# Patient Record
Sex: Female | Born: 1960
Health system: Southern US, Community
[De-identification: ages and names within clinical notes are randomized; demographics above are authoritative.]

## PROBLEM LIST (undated history)

## (undated) DIAGNOSIS — Z9889 Other specified postprocedural states: Secondary | ICD-10-CM

## (undated) DIAGNOSIS — M8580 Other specified disorders of bone density and structure, unspecified site: Secondary | ICD-10-CM

## (undated) DIAGNOSIS — Z86010 Personal history of colonic polyps: Secondary | ICD-10-CM

## (undated) DIAGNOSIS — F329 Major depressive disorder, single episode, unspecified: Secondary | ICD-10-CM

## (undated) DIAGNOSIS — F32A Depression, unspecified: Secondary | ICD-10-CM

## (undated) DIAGNOSIS — R112 Nausea with vomiting, unspecified: Secondary | ICD-10-CM

## (undated) DIAGNOSIS — B382 Pulmonary coccidioidomycosis, unspecified: Secondary | ICD-10-CM

## (undated) DIAGNOSIS — J449 Chronic obstructive pulmonary disease, unspecified: Secondary | ICD-10-CM

## (undated) DIAGNOSIS — N890 Mild vaginal dysplasia: Secondary | ICD-10-CM

## (undated) DIAGNOSIS — F410 Panic disorder [episodic paroxysmal anxiety] without agoraphobia: Secondary | ICD-10-CM

## (undated) HISTORY — DX: Chronic obstructive pulmonary disease, unspecified: J44.9

## (undated) HISTORY — PX: OVARIAN CYST REMOVAL: SHX89

## (undated) HISTORY — DX: Mild vaginal dysplasia: N89.0

## (undated) HISTORY — PX: BREAST EXCISIONAL BIOPSY: SUR124

## (undated) HISTORY — DX: Personal history of colonic polyps: Z86.010

## (undated) HISTORY — PX: CATARACT EXTRACTION: SUR2

## (undated) HISTORY — PX: HEMORRHOID SURGERY: SHX153

## (undated) HISTORY — DX: Pulmonary coccidioidomycosis, unspecified: B38.2

## (undated) HISTORY — DX: Other specified disorders of bone density and structure, unspecified site: M85.80

## (undated) HISTORY — PX: ABDOMINAL HYSTERECTOMY: SHX81

## (undated) HISTORY — PX: BILATERAL VATS ABLATION: SHX1224

## (undated) SURGERY — Surgical Case
Anesthesia: *Unknown

---

## 1999-04-05 HISTORY — PX: COLONOSCOPY: SHX174

## 1999-04-05 HISTORY — PX: LUNG SURGERY: SHX703

## 2003-04-05 HISTORY — PX: VESICOVAGINAL FISTULA CLOSURE W/ TAH: SUR271

## 2010-07-13 ENCOUNTER — Ambulatory Visit: Payer: Self-pay | Admitting: Gynecology

## 2010-07-15 ENCOUNTER — Ambulatory Visit (INDEPENDENT_AMBULATORY_CARE_PROVIDER_SITE_OTHER): Payer: PRIVATE HEALTH INSURANCE | Admitting: Gynecology

## 2010-07-15 ENCOUNTER — Other Ambulatory Visit: Payer: Self-pay | Admitting: Gynecology

## 2010-07-15 ENCOUNTER — Other Ambulatory Visit (HOSPITAL_COMMUNITY)
Admission: RE | Admit: 2010-07-15 | Discharge: 2010-07-15 | Disposition: A | Discharge status: Home / Self Care | Payer: PRIVATE HEALTH INSURANCE | Source: Ambulatory Visit | Attending: Gynecology | Admitting: Gynecology

## 2010-07-15 DIAGNOSIS — N951 Menopausal and female climacteric states: Secondary | ICD-10-CM

## 2010-07-15 DIAGNOSIS — R3589 Other polyuria: Secondary | ICD-10-CM

## 2010-07-15 DIAGNOSIS — Z124 Encounter for screening for malignant neoplasm of cervix: Secondary | ICD-10-CM | POA: Insufficient documentation

## 2010-07-15 DIAGNOSIS — Z01419 Encounter for gynecological examination (general) (routine) without abnormal findings: Secondary | ICD-10-CM

## 2010-07-15 DIAGNOSIS — F329 Major depressive disorder, single episode, unspecified: Secondary | ICD-10-CM

## 2010-07-15 DIAGNOSIS — N952 Postmenopausal atrophic vaginitis: Secondary | ICD-10-CM

## 2010-07-15 DIAGNOSIS — F3289 Other specified depressive episodes: Secondary | ICD-10-CM

## 2010-07-15 DIAGNOSIS — R358 Other polyuria: Secondary | ICD-10-CM

## 2010-07-22 ENCOUNTER — Other Ambulatory Visit: Payer: Self-pay | Admitting: Gynecology

## 2010-07-22 DIAGNOSIS — Z1231 Encounter for screening mammogram for malignant neoplasm of breast: Secondary | ICD-10-CM

## 2010-07-28 ENCOUNTER — Ambulatory Visit: Payer: PRIVATE HEALTH INSURANCE

## 2010-07-29 ENCOUNTER — Ambulatory Visit
Admission: RE | Admit: 2010-07-29 | Discharge: 2010-07-29 | Disposition: A | Discharge status: Home / Self Care | Payer: PRIVATE HEALTH INSURANCE | Source: Ambulatory Visit | Attending: Gynecology | Admitting: Gynecology

## 2010-07-29 DIAGNOSIS — Z1231 Encounter for screening mammogram for malignant neoplasm of breast: Secondary | ICD-10-CM

## 2010-07-30 ENCOUNTER — Ambulatory Visit (INDEPENDENT_AMBULATORY_CARE_PROVIDER_SITE_OTHER): Payer: PRIVATE HEALTH INSURANCE | Admitting: Women's Health

## 2010-07-30 DIAGNOSIS — L738 Other specified follicular disorders: Secondary | ICD-10-CM

## 2010-07-30 DIAGNOSIS — L678 Other hair color and hair shaft abnormalities: Secondary | ICD-10-CM

## 2010-08-02 ENCOUNTER — Other Ambulatory Visit: Payer: Self-pay | Admitting: Gynecology

## 2010-08-02 DIAGNOSIS — R928 Other abnormal and inconclusive findings on diagnostic imaging of breast: Secondary | ICD-10-CM

## 2010-08-05 ENCOUNTER — Ambulatory Visit
Admission: RE | Admit: 2010-08-05 | Discharge: 2010-08-05 | Disposition: A | Discharge status: Home / Self Care | Payer: PRIVATE HEALTH INSURANCE | Source: Ambulatory Visit | Attending: Gynecology | Admitting: Gynecology

## 2010-08-05 DIAGNOSIS — R928 Other abnormal and inconclusive findings on diagnostic imaging of breast: Secondary | ICD-10-CM

## 2010-08-19 ENCOUNTER — Other Ambulatory Visit (INDEPENDENT_AMBULATORY_CARE_PROVIDER_SITE_OTHER): Payer: PRIVATE HEALTH INSURANCE

## 2010-08-19 DIAGNOSIS — E7800 Pure hypercholesterolemia, unspecified: Secondary | ICD-10-CM

## 2010-08-19 DIAGNOSIS — R631 Polydipsia: Secondary | ICD-10-CM

## 2010-10-13 ENCOUNTER — Ambulatory Visit (HOSPITAL_COMMUNITY)
Admission: RE | Admit: 2010-10-13 | Discharge: 2010-10-13 | Disposition: A | Discharge status: Home / Self Care | Payer: PRIVATE HEALTH INSURANCE | Attending: Psychiatry | Admitting: Psychiatry

## 2010-10-13 ENCOUNTER — Emergency Department (HOSPITAL_COMMUNITY)
Admission: EM | Admit: 2010-10-13 | Discharge: 2010-10-14 | Disposition: A | Discharge status: Other Acute Inpatient Hospital | Payer: PRIVATE HEALTH INSURANCE | Attending: Emergency Medicine | Admitting: Emergency Medicine

## 2010-10-13 DIAGNOSIS — F3289 Other specified depressive episodes: Secondary | ICD-10-CM | POA: Insufficient documentation

## 2010-10-13 DIAGNOSIS — F329 Major depressive disorder, single episode, unspecified: Secondary | ICD-10-CM | POA: Insufficient documentation

## 2010-10-13 DIAGNOSIS — F332 Major depressive disorder, recurrent severe without psychotic features: Secondary | ICD-10-CM | POA: Insufficient documentation

## 2010-10-13 LAB — URINALYSIS, ROUTINE W REFLEX MICROSCOPIC
Bilirubin Urine: NEGATIVE
Glucose, UA: NEGATIVE mg/dL
Ketones, ur: NEGATIVE mg/dL
Leukocytes, UA: NEGATIVE
Nitrite: NEGATIVE
Protein, ur: NEGATIVE mg/dL
Specific Gravity, Urine: 1.009 (ref 1.005–1.030)
Urobilinogen, UA: 0.2 mg/dL (ref 0.0–1.0)
pH: 7 (ref 5.0–8.0)

## 2010-10-13 LAB — ETHANOL: Alcohol, Ethyl (B): <11 mg/dL (ref 0–11)

## 2010-10-13 LAB — RAPID URINE DRUG SCREEN, HOSP PERFORMED
Amphetamines: NOT DETECTED
Barbiturates: NOT DETECTED
Benzodiazepines: NOT DETECTED
Cocaine: NOT DETECTED
Opiates: NOT DETECTED
Tetrahydrocannabinol: NOT DETECTED

## 2010-10-13 LAB — DIFFERENTIAL
Basophils Absolute: 0 10*3/uL (ref 0.0–0.1)
Basophils Relative: 0 % (ref 0–1)
Eosinophils Absolute: 0.4 10*3/uL (ref 0.0–0.7)
Eosinophils Relative: 3 % (ref 0–5)
Lymphocytes Relative: 34 % (ref 12–46)
Lymphs Abs: 5.2 10*3/uL — ABNORMAL HIGH (ref 0.7–4.0)
Monocytes Absolute: 0.7 10*3/uL (ref 0.1–1.0)
Monocytes Relative: 5 % (ref 3–12)
Neutro Abs: 9 10*3/uL — ABNORMAL HIGH (ref 1.7–7.7)
Neutrophils Relative %: 58 % (ref 43–77)

## 2010-10-13 LAB — CBC
HCT: 40.5 % (ref 36.0–46.0)
Hemoglobin: 13.9 g/dL (ref 12.0–15.0)
MCH: 31.7 pg (ref 26.0–34.0)
MCHC: 34.3 g/dL (ref 30.0–36.0)
MCV: 92.3 fL (ref 78.0–100.0)
Platelets: 319 10*3/uL (ref 150–400)
RBC: 4.39 MIL/uL (ref 3.87–5.11)
RDW: 12.5 % (ref 11.5–15.5)
WBC: 15.3 10*3/uL — ABNORMAL HIGH (ref 4.0–10.5)

## 2010-10-13 LAB — URINE MICROSCOPIC-ADD ON

## 2010-10-13 LAB — BASIC METABOLIC PANEL
BUN: 11 mg/dL (ref 6–23)
CO2: 28 mEq/L (ref 19–32)
Calcium: 9.5 mg/dL (ref 8.4–10.5)
Chloride: 103 mEq/L (ref 96–112)
Creatinine, Ser: 0.71 mg/dL (ref 0.50–1.10)
GFR calc Af Amer: >60 mL/min (ref >60)
GFR calc non Af Amer: >60 mL/min (ref >60)
Glucose, Bld: 97 mg/dL (ref 70–99)
Potassium: 4 mEq/L (ref 3.5–5.1)
Sodium: 139 mEq/L (ref 135–145)

## 2010-10-13 LAB — POCT PREGNANCY, URINE: Preg Test, Ur: NEGATIVE

## 2010-10-14 ENCOUNTER — Inpatient Hospital Stay (HOSPITAL_COMMUNITY)
Admission: AD | Admit: 2010-10-14 | Discharge: 2010-10-20 | DRG: 885 | Disposition: A | Discharge status: Home / Self Care | Payer: PRIVATE HEALTH INSURANCE | Source: Ambulatory Visit | Attending: Psychiatry | Admitting: Psychiatry

## 2010-10-14 DIAGNOSIS — R911 Solitary pulmonary nodule: Secondary | ICD-10-CM

## 2010-10-14 DIAGNOSIS — H16219 Exposure keratoconjunctivitis, unspecified eye: Secondary | ICD-10-CM

## 2010-10-14 DIAGNOSIS — F332 Major depressive disorder, recurrent severe without psychotic features: Principal | ICD-10-CM

## 2010-10-14 DIAGNOSIS — R9389 Abnormal findings on diagnostic imaging of other specified body structures: Secondary | ICD-10-CM

## 2010-10-14 DIAGNOSIS — Z609 Problem related to social environment, unspecified: Secondary | ICD-10-CM

## 2010-10-14 DIAGNOSIS — Q6101 Congenital single renal cyst: Secondary | ICD-10-CM

## 2010-10-14 DIAGNOSIS — R45851 Suicidal ideations: Secondary | ICD-10-CM

## 2010-10-14 DIAGNOSIS — R109 Unspecified abdominal pain: Secondary | ICD-10-CM

## 2010-10-17 ENCOUNTER — Ambulatory Visit (HOSPITAL_COMMUNITY)
Admission: EM | Admit: 2010-10-17 | Discharge: 2010-10-17 | Disposition: A | Discharge status: Home / Self Care | Payer: PRIVATE HEALTH INSURANCE | Source: Ambulatory Visit | Attending: Psychiatry | Admitting: Psychiatry

## 2010-10-17 ENCOUNTER — Ambulatory Visit (HOSPITAL_COMMUNITY): Payer: PRIVATE HEALTH INSURANCE

## 2010-10-17 DIAGNOSIS — F17200 Nicotine dependence, unspecified, uncomplicated: Secondary | ICD-10-CM | POA: Insufficient documentation

## 2010-10-17 DIAGNOSIS — R109 Unspecified abdominal pain: Secondary | ICD-10-CM | POA: Insufficient documentation

## 2010-10-17 DIAGNOSIS — Z90710 Acquired absence of both cervix and uterus: Secondary | ICD-10-CM | POA: Insufficient documentation

## 2010-10-17 DIAGNOSIS — Z79899 Other long term (current) drug therapy: Secondary | ICD-10-CM | POA: Insufficient documentation

## 2010-10-17 DIAGNOSIS — R110 Nausea: Secondary | ICD-10-CM | POA: Insufficient documentation

## 2010-10-17 LAB — URINALYSIS, ROUTINE W REFLEX MICROSCOPIC
Bilirubin Urine: NEGATIVE
Glucose, UA: NEGATIVE mg/dL
Ketones, ur: NEGATIVE mg/dL
Leukocytes, UA: NEGATIVE
Nitrite: NEGATIVE
Protein, ur: NEGATIVE mg/dL
Specific Gravity, Urine: 1.02 (ref 1.005–1.030)
Urobilinogen, UA: 0.2 mg/dL (ref 0.0–1.0)
pH: 5.5 (ref 5.0–8.0)

## 2010-10-17 LAB — URINE MICROSCOPIC-ADD ON

## 2010-10-18 ENCOUNTER — Ambulatory Visit (HOSPITAL_COMMUNITY)
Admit: 2010-10-18 | Discharge: 2010-10-18 | Disposition: A | Discharge status: Home / Self Care | Payer: PRIVATE HEALTH INSURANCE | Attending: Psychiatry | Admitting: Psychiatry

## 2010-10-18 DIAGNOSIS — R319 Hematuria, unspecified: Secondary | ICD-10-CM | POA: Insufficient documentation

## 2010-10-18 DIAGNOSIS — Q619 Cystic kidney disease, unspecified: Secondary | ICD-10-CM | POA: Insufficient documentation

## 2010-10-18 DIAGNOSIS — R109 Unspecified abdominal pain: Secondary | ICD-10-CM | POA: Insufficient documentation

## 2010-10-18 MED ORDER — GADOBENATE DIMEGLUMINE 529 MG/ML IV SOLN
15.0000 mL | Freq: Once | INTRAVENOUS | Status: AC | PRN
  Administered 2010-10-18: 15 mL via INTRAVENOUS

## 2010-10-19 LAB — URINE CULTURE
Colony Count: NO GROWTH
Culture  Setup Time: 201207170131
Culture: NO GROWTH
Special Requests: NEGATIVE

## 2010-10-19 LAB — GC/CHLAMYDIA PROBE AMP, URINE
Chlamydia, Swab/Urine, PCR: NEGATIVE
GC Probe Amp, Urine: NEGATIVE

## 2010-11-05 NOTE — Discharge Summary (Signed)
NAMECHESTER, ROMERO NO.:  [id number]  MEDICAL RECORD NO.:  [id number]  LOCATION:  1610                          FACILITY:  [facility]  PHYSICIAN:  Orson Aloe, MD       DATE OF BIRTH:  Sep 02, 1960  DATE OF ADMISSION:  10/14/2010 DATE OF DISCHARGE:  10/20/2010                              DISCHARGE SUMMARY   IDENTIFYING INFORMATION:  This is a 50 year old divorced female.  This is a voluntary admission.  HISTORY OF PRESENT ILLNESS:  This is the first [facility] admission for Crystal Duarte, a native of [place], who had just come to [place] a few months ago. Moved here to be near her children and grandchildren and has a history of major depression that has been stabilized on Cymbalta for about 4 years.  Eight weeks ago she began to feel that her medication was no longer working and could feel herself sliding downward into depression. She reported significant and gradual onset of anhedonia with a lot of difficulty getting her daily activities done, had gotten to the point where she stopped bathing, stopped wearing makeup, was not even walking the dog regularly.  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  She had not developed a specific suicidal plan, but has a history of 3 significant prior suicide attempts and was recognizing that she was feeling unstable as her depression got worse.  She has no history of substance abuse.  Her psychiatric provider had increased her Cymbalta 6 weeks ago to 90 mg, but it gave her strange thoughts, and she was feeling that she was having an out-of-body experience.  She presented requesting help with her severe depression.  PAST PSYCHIATRIC HISTORY:  Significant for a history of prior suicide attempt, first at age 46 by overdosing on analgesics; also a history of overdosing on alprazolam and most recently in 2008 an attempt at  carbon monoxide poisoning.  She has received a series of 6 ECT treatments in the past in [place] after the carbon monoxide poisoning attempt.  Most recently, Abilify 2.5 mg daily was added to her medication regimen.  MEDICAL EVALUATION AND DIAGNOSTIC STUDIES:  She was medically evaluated in our emergency room.  This is a healthy female who presented in no acute distress with a normal basic metabolic panel.  Urine drug screen negative.  Alcohol screen negative.  CBC normal.  BUN 11, creatinine 0.71.  She is a well-nourished, well-developed female who appears to be her stated age with no abnormal movements or focal neurologic symptoms.  CHRONIC MEDICAL PROBLEMS:  Chronic dry eyes and she is postmenopausal.  PRIMARY CARE PRACTITIONER:  Dr. Lily Peer, her gynecologist.  COURSE OF HOSPITALIZATION:  She was admitted to our mood disorders program and after a discussion of risks and benefits and options, we elected to discontinue her Cymbalta since she had been intolerant of an increased dose, and we elected to start her on Effexor XR 37.5 mg daily, which we had gradually titrated to 37.5 mg p.o. b.i.d. and finally to 75 mg in the morning and 37.5 mg in the afternoon.  We  also continued her Abilify, but increased it to 2.5 mg b.i.d.  She tolerated the medications well and without side effects and felt that they made a significant difference in her mood, gradually became brighter in affect, more spontaneous, speech more fluent, and felt the depression lifting. She consistently denied suicidal thoughts.  She developed right flank pain, was evaluated in our emergency room, where she was found to have trace blood in her urine, but no evidence of infection.  CT scan of her abdomen revealed a nodule at the base of her lung and an unclear mass on the upper pole of her kidney.  MRI of her abdomen revealed a 9-mm benign hemorrhagic cyst in the upper pole of the right kidney which corresponded with  the lesion seen on the recent abdomen CT.  It is noted that there was no evidence of renal neoplasm or other significant abnormality.  Because of her concerns about a history of acute kidney infections, we did set up a follow-up appointment for her with [facility].  She had a previous history of alcohol abuse and did not experience any withdrawal symptoms while here.  She was given methocarbamol, a brief course of Percocet for the flank pain.  She had a low-grade temperature at 99.9 and urine cultures were negative.  GC and chlamydia DNA probes were negative.  By the 16th, she was feeling good, mood stabilized, felt she had made significant improvement, was planning on taking 2 weeks off work to evaluate her work situation, take medications, and complete scheduled follow-up appointments, denying any suicidal or homicidal thoughts.  DISCHARGE DIAGNOSES:  AXIS I:  Major depression, recurrent severe. AXIS II:  No diagnosis. AXIS III:  Right flank pain, lung nodule NOS, and right kidney cyst. AXIS IV:  Severe occupational stressors. AXIS V:  Current 44, past year 39.  PLAN:  To have her follow up with Cora Collum, NP, on July 19 at 1:15 p.m.  She is also schedule with [facility] on August 8 at 1:30 p.m. and Dr. Marcelyn Bruins at [facility] for her pulmonary nodule on August 10 at 3:30.     Crystal Duarte, N.P.   ______________________________ Orson Aloe, MD    MAS/MEDQ  D:  10/20/2010  T:  10/21/2010  Job:  [id number]  Electronically Signed by Kari Baars N.P. on 10/27/2010 04:54:09 AM Electronically Signed by Orson Aloe  on 11/05/2010 10:24:42 AM

## 2010-11-05 NOTE — Assessment & Plan Note (Signed)
NAMEBETTY, Duarte NO.:  [id number]  MEDICAL RECORD NO.:  [id number]  LOCATION:  1610                          FACILITY:  [facility]  PHYSICIAN:  Orson Aloe, MD       DATE OF BIRTH:  [birth year]  DATE OF ADMISSION:  10/14/2010 DATE OF DISCHARGE:                      PSYCHIATRIC ADMISSION ASSESSMENT   DATE OF ASSESSMENT:  October 14, 2010, at 13:55.  IDENTIFYING INFORMATION:  A 50 year old female, divorced.  This is a voluntary admission.  HISTORY OF THE PRESENT ILLNESS:  This is the first [facility] admission for Crystal Duarte, a native of [place] who has just been in [place] a few months.  She moved here to be near her children and grandchildren and has a history of major depression.  She has been stable on Cymbalta for about 4 years.  Six to eight weeks ago, she began to feel that her medication was no longer working and could feel herself sliding downward into depression.  She reports that she can feel that she is having a personality change and has a lot of difficulty with getting interested in any daily activities.  This has been getting progressively worse very gradually for the last 6 weeks, and she reports that for the past couple weeks, she has been simply lying on the couch unable to get up and on Sundays is not even walking the dog.  She stopped wearing makeup. Hygiene has deteriorated, and she feels completely anhedonic.  Her concentration has gotten progressively worse and as it worsens, this is accompanied by some increased irritability.  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 She has also developed some hypersensitivity to noise and has had increasing episodes of panic.  Her sleep is 7 hours a night on a good night but is not completely restful and is broken on most nights.  She feels that she is gaining weight in spite of her decreased appetite but cannot quantify this.  She approached her psychiatric nurse  practitioner who increased her Cymbalta to 90 mg, but it gave her strange thoughts, feeling that she was having an out of body experience.  She gradually became more frightened of her mood since she has a history of 3 significant prior suicide attempts, and she was feeling very unstable and asks for help. She denies a history of substance abuse.  PAST PSYCHIATRIC HISTORY:  Currently followed as an outpatient by Cora Collum, NP, in Dr. Rolly Pancake McKinney's office.  She has a history of major depression with onset at age 32 with a suicide attempt by overdose of analgesics.  She also has history of overdosing on alprazolam and carbon monoxide poisoning.  Last suicide attempt was around 2008 due to carbon monoxide poisoning and at that time, she received ECT in [place], approximately 5-6 treatment sessions. Subsequently, she was placed on Cymbalta 60 mg daily on which she has been quite stable in the past 4 years, also taking trazodone 100 mg at h.s.  When this episode started, she was increased to 90 mg daily but felt intolerant of it, having strange feelings and thoughts.  Abilify 2.5 mg was added 2 days ago, but  she feels it is not helping.  Additional medication trials in the past include Lexapro and Prozac with positive responses.  She is very intolerant of Wellbutrin which made her feel very anxious with a sensation of her skin crawling.  No trials of Zoloft, and no previous trials of Effexor  SOCIAL HISTORY:  Originally born and raised in [place].  She has 2 grown children and 2 grandchildren who live in town.  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  She endorses social isolation since moving to [place] and misses the active social life she had in [place], [place].  No longer drinks alcohol but did in the past.  FAMILY HISTORY:  Denies a family history of mental illness or substance abuse.  MEDICAL HISTORY: 1. Primary care physician is Dr.  Lily Peer, her gynecologist. 2. Chronic medical problems are post menopausal and dry eyes. 3. Medications are gel hormone therapy with __________ and Restasis     eyedrops 2 drops b.i.d. both eyes, Abilify 2.5 mg daily, Cymbalta     60 mg daily, trazodone 100 mg p.o. q.h.s. 4. Drug allergies are SULFA.  PHYSICAL EXAM:  Done in the emergency room and is noted in the record. This is a healthy female in no acute distress with a normal basic metabolic panel.  Urine drug screen negative.  Alcohol screen negative. CBC normal with hemoglobin 13.9, platelets 319,000.  BUN 11, creatinine 0.71.  This is a well-nourished, well-developed female with a smooth motor exam, normally-developed, no abnormal movements.  MENTAL STATUS EXAM:  Fully alert female.  Good eye contact.  Mildly anxious affect, blunted affect.  Speech non-pressured.  Gives a coherent history.  Mood depressed, quite fearful that she will harm herself, feeling that her mood is unstable.  Thinking is nonpsychotic. Cognitively, she is intact and oriented x3.  Endorsing social isolation. Affect stable.  Mood stable.  Insight adequate.  Fund of knowledge good. Impulse control and judgment normal.  Axis I:  Major depressive disorder, recurrent, severe. Axis II:  No diagnosis. Axis III:  Dry eyes. Axis IV:  Social isolation is a problem.  Supportive family is an asset. Axis V:  Current 45.  Past year not known.  PLAN:  Voluntarily admit her with the goal of alleviating her passive suicidal thoughts and stabilizing her depressive spiral.  We are going to look at discontinuing her Cymbalta and have discussed several options for today.  We are going to increase her Abilify to 2.5 mg b.i.d., and we will get a TSH and a free T4 and a free T3.     Margaret A. Lorin Picket, N.P.   ______________________________ Orson Aloe, MD    MAS/MEDQ  D:  10/14/2010  T:  10/14/2010  Job:  [id number]  Electronically Signed by Kari Baars N.P. on  10/15/2010 09:06:22 AM Electronically Signed by Orson Aloe  on 11/05/2010 10:24:54 AM

## 2010-11-08 ENCOUNTER — Other Ambulatory Visit: Payer: Self-pay | Admitting: *Deleted

## 2010-11-08 MED ORDER — ESTRADIOL 0.52 MG/0.87 GM (0.06%) TD GEL: TRANSDERMAL | Status: DC

## 2010-11-12 ENCOUNTER — Encounter: Payer: Self-pay | Admitting: Pulmonary Disease

## 2010-11-12 ENCOUNTER — Ambulatory Visit (INDEPENDENT_AMBULATORY_CARE_PROVIDER_SITE_OTHER): Payer: PRIVATE HEALTH INSURANCE | Admitting: Pulmonary Disease

## 2010-11-12 VITALS — BP 100/60 | HR 76 | Temp 98.6°F | Ht 65.0 in | Wt 162.8 lb

## 2010-11-12 DIAGNOSIS — R918 Other nonspecific abnormal finding of lung field: Secondary | ICD-10-CM

## 2010-11-12 NOTE — Patient Instructions (Signed)
Will schedule for full ct scan of chest to establish your baseline.  Will call you with results, and set up surveillance schedule.   Will try and get your old scans from [facility] Stop smoking.  See flyer for number to smoking cessation classes at [facility].

## 2010-11-12 NOTE — Progress Notes (Signed)
  Subjective:    Patient ID: Crystal Duarte, female    DOB: 11-04-1960, 51 y.o.   MRN: [id number]  HPI The patient is a 50 year old female who I've been asked to see for pulmonary nodules.  She recently underwent a CT of her abdomen and pelvis for possible kidney stones, and was noted to have on the lower lung cuts small pulmonary nodules.  She has not had a complete chest CT.  The patient does have a long history of tobacco abuse, and currently is smoking one pack of cigarettes a day.  She also has lived in [place], and underwent a lung biopsy in 2001 which she tells me was positive for coccidiomycosis.  She denies any history of active tuberculosis or exposure to anyone with active TB.  She has a typical smoker's cough, but has been eating well with no recent weight loss.   Review of Systems  Constitutional: Negative for fever and unexpected weight change.  HENT: Positive for dental problem. Negative for ear pain, nosebleeds, congestion, sore throat, rhinorrhea, sneezing, trouble swallowing, postnasal drip and sinus pressure.   Eyes: Negative for redness and itching.  Respiratory: Positive for cough and shortness of breath. Negative for chest tightness and wheezing.   Cardiovascular: Negative for palpitations and leg swelling.  Gastrointestinal: Negative for nausea and vomiting.  Genitourinary: Negative for dysuria.  Musculoskeletal: Negative for joint swelling.  Skin: Negative for rash.  Neurological: Negative for headaches.  Hematological: Does not bruise/bleed easily.  Psychiatric/Behavioral: Positive for dysphoric mood. The patient is nervous/anxious.        Objective:   Physical Exam Constitutional:  Well developed, no acute distress  HENT:  Nares patent without discharge  Oropharynx without exudate, palate and uvula are normal  Eyes:  Perrla, eomi, no scleral icterus  Neck:  No JVD, no TMG  Cardiovascular:  Normal rate, regular rhythm, no rubs or gallops.  No murmurs  Intact distal pulses  Pulmonary :  Normal breath sounds, no stridor or respiratory distress   No rales, rhonchi, or wheezing  Abdominal:  Soft, nondistended, bowel sounds present.  No tenderness noted.   Musculoskeletal:  No lower extremity edema noted.  Lymph Nodes:  No cervical lymphadenopathy noted  Skin:  No cyanosis noted  Neurologic:  Alert, appropriate, moves all 4 extremities without obvious deficit.         Assessment & Plan:

## 2010-11-14 ENCOUNTER — Encounter: Payer: Self-pay | Admitting: Pulmonary Disease

## 2010-11-14 NOTE — Assessment & Plan Note (Signed)
The patient has been noted to have pulmonary nodules on her CT abdomen and pelvis, and these are more than likely due to old coccidiomycosis.  However, she does need a full CT chest for complete evaluation.  We'll try and obtain her old CT from [facility], but if not available will need surveillance to ensure we're dealing with benign disease.  The patient is agreeable to this approach.  I have also stressed to her the need to quit smoking.

## 2010-11-29 ENCOUNTER — Ambulatory Visit (INDEPENDENT_AMBULATORY_CARE_PROVIDER_SITE_OTHER)
Admission: RE | Admit: 2010-11-29 | Discharge: 2010-11-29 | Disposition: A | Discharge status: Home / Self Care | Payer: PRIVATE HEALTH INSURANCE | Source: Ambulatory Visit | Attending: Pulmonary Disease | Admitting: Pulmonary Disease

## 2010-11-29 DIAGNOSIS — R918 Other nonspecific abnormal finding of lung field: Secondary | ICD-10-CM

## 2010-11-29 MED ORDER — IOHEXOL 300 MG/ML  SOLN
100.0000 mL | Freq: Once | INTRAMUSCULAR | Status: AC | PRN
  Administered 2010-11-29: 100 mL via INTRAVENOUS

## 2010-12-02 ENCOUNTER — Telehealth: Payer: Self-pay | Admitting: Pulmonary Disease

## 2010-12-02 NOTE — Telephone Encounter (Signed)
Please let pt know that her chest ct shows multiple TINY small nodules. None of them look suspicious. Are trying to get old ct from [facility]. If available, and comparison shows nothing has changed, no further followup required. If not available, will need followup ct chest in one year. Have her call us in 2 weeks if she has not heard from [place].   Spoke with pt and she is aware of ct results. Pt verbalized understanding and is aware to call back in 2 weeks if she has no heard from [place]. Pt had no questions

## 2012-07-11 ENCOUNTER — Encounter (HOSPITAL_COMMUNITY): Payer: Self-pay | Admitting: Emergency Medicine

## 2012-07-11 ENCOUNTER — Emergency Department (HOSPITAL_COMMUNITY)
Admission: EM | Admit: 2012-07-11 | Discharge: 2012-07-11 | Disposition: A | Discharge status: Home / Self Care | Payer: BC Managed Care – PPO | Attending: Emergency Medicine | Admitting: Emergency Medicine

## 2012-07-11 DIAGNOSIS — F17200 Nicotine dependence, unspecified, uncomplicated: Secondary | ICD-10-CM | POA: Insufficient documentation

## 2012-07-11 DIAGNOSIS — M542 Cervicalgia: Secondary | ICD-10-CM | POA: Insufficient documentation

## 2012-07-11 DIAGNOSIS — T43505A Adverse effect of unspecified antipsychotics and neuroleptics, initial encounter: Secondary | ICD-10-CM | POA: Insufficient documentation

## 2012-07-11 DIAGNOSIS — F3289 Other specified depressive episodes: Secondary | ICD-10-CM | POA: Insufficient documentation

## 2012-07-11 DIAGNOSIS — T43205A Adverse effect of unspecified antidepressants, initial encounter: Secondary | ICD-10-CM | POA: Insufficient documentation

## 2012-07-11 DIAGNOSIS — Z8619 Personal history of other infectious and parasitic diseases: Secondary | ICD-10-CM | POA: Insufficient documentation

## 2012-07-11 DIAGNOSIS — T50905A Adverse effect of unspecified drugs, medicaments and biological substances, initial encounter: Secondary | ICD-10-CM

## 2012-07-11 DIAGNOSIS — R0602 Shortness of breath: Secondary | ICD-10-CM | POA: Insufficient documentation

## 2012-07-11 DIAGNOSIS — F329 Major depressive disorder, single episode, unspecified: Secondary | ICD-10-CM | POA: Insufficient documentation

## 2012-07-11 DIAGNOSIS — R1310 Dysphagia, unspecified: Secondary | ICD-10-CM | POA: Insufficient documentation

## 2012-07-11 DIAGNOSIS — M79609 Pain in unspecified limb: Secondary | ICD-10-CM | POA: Insufficient documentation

## 2012-07-11 DIAGNOSIS — Z79899 Other long term (current) drug therapy: Secondary | ICD-10-CM | POA: Insufficient documentation

## 2012-07-11 DIAGNOSIS — Z9889 Other specified postprocedural states: Secondary | ICD-10-CM | POA: Insufficient documentation

## 2012-07-11 DIAGNOSIS — R002 Palpitations: Secondary | ICD-10-CM | POA: Insufficient documentation

## 2012-07-11 HISTORY — DX: Major depressive disorder, single episode, unspecified: F32.9

## 2012-07-11 HISTORY — DX: Depression, unspecified: F32.A

## 2012-07-11 MED ORDER — ALPRAZOLAM 1 MG PO TABS: 1.0000 mg | ORAL_TABLET | Freq: Every evening | ORAL | Status: DC | PRN

## 2012-07-11 NOTE — ED Notes (Signed)
telepsych info faxed and called 

## 2012-07-11 NOTE — ED Notes (Signed)
Came to nurses station requesting to go home. Stated she didn't realize it would take so long to talk with telepsych  and that she would talk with her psychiatrist in the morning. Spoke with Dr.Allen about patient wanting to go home-he said he would talk with Dr.Beacon. D/C instructions received.

## 2012-07-11 NOTE — ED Provider Notes (Signed)
History     CSN: [id number]  Arrival date & time 07/11/12  1400   First MD Initiated Contact with Patient 07/11/12 1516      Chief Complaint  Patient presents with  . Shortness of Breath  . Allergic Reaction  . Palpitations     HPI Pt states she recently began zoloft and vistiril.  She was switched from Cymbalta and trazodone.  She states that were working more. Took it for 2nd time at 1000. Pt states she started having sob, palpitations, R arm and neck pain. . Pt states she has sob and difficulty swallowing. Pt able to speak in complete sentences and can manage saliva.  Patient has had depression recently but has had no suicidal or homicidal thoughts or ideation.   Past Medical History  Diagnosis Date  . Coccidioidomycosis, pulmonary   . Depression     Past Surgical History  Procedure Laterality Date  . Lung surgery  2001    VATS surgery   . Vesicovaginal fistula closure w/ tah  2005    Family History  Problem Relation Age of Onset  . Asthma Mother   . Ovarian cancer Mother     History  Substance Use Topics  . Smoking status: Current Every Day Smoker -- 1.00 packs/day for 30 years    Types: Cigarettes  . Smokeless tobacco: Never Used  . Alcohol Use: No    OB History   Grav Para Term Preterm Abortions TAB SAB Ect Mult Living                  Review of Systems  All other systems reviewed and are negative.    Allergies  Sulfa antibiotics  Home Medications   Current Outpatient Rx  Name  Route  Sig  Dispense  Refill  . hydrOXYzine (ATARAX/VISTARIL) 25 MG tablet   Oral   Take 25 mg by mouth 2 (two) times daily as needed for itching.         Marland Kitchen ibuprofen (ADVIL,MOTRIN) 200 MG tablet   Oral   Take 400 mg by mouth every 8 (eight) hours as needed for pain.         Marland Kitchen QUEtiapine (SEROQUEL) 100 MG tablet   Oral   Take 50-100 mg by mouth at bedtime.         . sertraline (ZOLOFT) 50 MG tablet   Oral   Take 50 mg by mouth daily.         Marland Kitchen  ALPRAZolam (XANAX) 1 MG tablet   Oral   Take 1 tablet (1 mg total) by mouth at bedtime as needed for sleep.   30 tablet   0     BP 128/79  Pulse 76  Temp(Src) 98.9 F (37.2 C) (Oral)  Resp 22  SpO2 99%  Physical Exam  Nursing note and vitals reviewed. Constitutional: She is oriented to person, place, and time. She appears well-developed and well-nourished. No distress.  HENT:  Head: Normocephalic and atraumatic.  Eyes: Pupils are equal, round, and reactive to light.  Neck: Normal range of motion.  Cardiovascular: Normal rate and intact distal pulses.   Pulmonary/Chest: No respiratory distress.  Abdominal: Normal appearance. She exhibits no distension.  Musculoskeletal: Normal range of motion.  Neurological: She is alert and oriented to person, place, and time. No cranial nerve deficit.  Skin: Skin is warm and dry. No rash noted.  Psychiatric: Her speech is normal and behavior is normal. Judgment normal. Thought content is not paranoid and  not delusional. Cognition and memory are normal. She exhibits a depressed mood. She expresses no homicidal and no suicidal ideation. She expresses no suicidal plans and no homicidal plans.    ED Course  Procedures (including critical care time)  Labs Reviewed - No data to display No results found.   1. Adverse effects of medication, initial encounter       MDM  Plan: Move to psych ED and obtain a telemetry psych consultation       Nelia Shi, MD 07/12/12 2212

## 2012-07-11 NOTE — ED Notes (Signed)
Pt states she recently began zoloft and vistiril.  Took it for 2nd time at 1000.  Pt states she started having sob, palpitations, R arm and neck pain.  EKG completed and shown to MD.  Pt states she has sob and difficulty swallowing.  Pt able to speak in complete sentences and can manage saliva.

## 2012-07-11 NOTE — ED Notes (Signed)
Transferred to psych ed for a telepsych consult-wants to ask a psychiatrist a question about her meds. Denies SI/SA, A/V hallucinations. Does state a HX of depression with ECT treatments at [facility] in the past and anxiety. Recent change in her antidepressant that initially brought her to the ed with complaint of SOB, palpitations, ? allergic reaction. No labs were drawn. EKG WNL.

## 2012-08-07 ENCOUNTER — Other Ambulatory Visit: Payer: Self-pay | Admitting: Otolaryngology

## 2012-08-07 DIAGNOSIS — R221 Localized swelling, mass and lump, neck: Secondary | ICD-10-CM

## 2012-08-07 DIAGNOSIS — M542 Cervicalgia: Secondary | ICD-10-CM

## 2012-08-07 DIAGNOSIS — R599 Enlarged lymph nodes, unspecified: Secondary | ICD-10-CM

## 2012-08-16 ENCOUNTER — Ambulatory Visit
Admission: RE | Admit: 2012-08-16 | Discharge: 2012-08-16 | Disposition: A | Discharge status: Home / Self Care | Payer: BC Managed Care – PPO | Source: Ambulatory Visit | Attending: Otolaryngology | Admitting: Otolaryngology

## 2012-08-16 DIAGNOSIS — R221 Localized swelling, mass and lump, neck: Secondary | ICD-10-CM

## 2012-08-16 DIAGNOSIS — R599 Enlarged lymph nodes, unspecified: Secondary | ICD-10-CM

## 2012-08-16 DIAGNOSIS — M542 Cervicalgia: Secondary | ICD-10-CM

## 2012-08-28 ENCOUNTER — Ambulatory Visit (INDEPENDENT_AMBULATORY_CARE_PROVIDER_SITE_OTHER): Payer: BC Managed Care – PPO | Admitting: Gynecology

## 2012-08-28 ENCOUNTER — Encounter: Payer: Self-pay | Admitting: Gynecology

## 2012-08-28 VITALS — BP 128/78 | Ht 64.0 in | Wt 168.0 lb

## 2012-08-28 DIAGNOSIS — R609 Edema, unspecified: Secondary | ICD-10-CM

## 2012-08-28 DIAGNOSIS — F17210 Nicotine dependence, cigarettes, uncomplicated: Secondary | ICD-10-CM | POA: Insufficient documentation

## 2012-08-28 DIAGNOSIS — Z780 Asymptomatic menopausal state: Secondary | ICD-10-CM | POA: Insufficient documentation

## 2012-08-28 DIAGNOSIS — F329 Major depressive disorder, single episode, unspecified: Secondary | ICD-10-CM

## 2012-08-28 DIAGNOSIS — Z01419 Encounter for gynecological examination (general) (routine) without abnormal findings: Secondary | ICD-10-CM

## 2012-08-28 DIAGNOSIS — F17200 Nicotine dependence, unspecified, uncomplicated: Secondary | ICD-10-CM

## 2012-08-28 DIAGNOSIS — F3289 Other specified depressive episodes: Secondary | ICD-10-CM

## 2012-08-28 DIAGNOSIS — F32A Depression, unspecified: Secondary | ICD-10-CM

## 2012-08-28 MED ORDER — HYDROCHLOROTHIAZIDE 12.5 MG PO CAPS: 12.5000 mg | ORAL_CAPSULE | Freq: Every day | ORAL | Status: DC

## 2012-08-28 MED ORDER — GABAPENTIN 100 MG PO CAPS: 100.0000 mg | ORAL_CAPSULE | Freq: Three times a day (TID) | ORAL | Status: DC

## 2012-08-28 NOTE — Patient Instructions (Addendum)
Tetanus, Diphtheria, Pertussis (Tdap) Vaccine What You Need to Know WHY GET VACCINATED? Tetanus, diphtheria and pertussis can be very serious diseases, even for adolescents and adults. Tdap vaccine can protect us from these diseases. TETANUS (Lockjaw) causes painful muscle tightening and stiffness, usually all over the body.  It can lead to tightening of muscles in the head and neck so you can't open your mouth, swallow, or sometimes even breathe. Tetanus kills about 1 out of 5 people who are infected. DIPHTHERIA can cause a thick coating to form in the back of the throat.  It can lead to breathing problems, paralysis, heart failure, and death. PERTUSSIS (Whooping Cough) causes severe coughing spells, which can cause difficulty breathing, vomiting and disturbed sleep.  It can also lead to weight loss, incontinence, and rib fractures. Up to 2 in 100 adolescents and 5 in 100 adults with pertussis are hospitalized or have complications, which could include pneumonia and death. These diseases are caused by bacteria. Diphtheria and pertussis are spread from person to person through coughing or sneezing. Tetanus enters the body through cuts, scratches, or wounds. Before vaccines, the [place] saw as many as 200,000 cases a year of diphtheria and pertussis, and hundreds of cases of tetanus. Since vaccination began, tetanus and diphtheria have dropped by about 99% and pertussis by about 80%. TDAP VACCINE Tdap vaccine can protect adolescents and adults from tetanus, diphtheria, and pertussis. One dose of Tdap is routinely given at age 11 or 12. People who did not get Tdap at that age should get it as soon as possible. Tdap is especially important for health care professionals and anyone having close contact with a baby younger than 12 months. Pregnant women should get a dose of Tdap during every pregnancy, to protect the newborn from pertussis. Infants are most at risk for severe, life-threatening  complications from pertussis. A similar vaccine, called Td, protects from tetanus and diphtheria, but not pertussis. A Td booster should be given every 10 years. Tdap may be given as one of these boosters if you have not already gotten a dose. Tdap may also be given after a severe cut or burn to prevent tetanus infection. Your doctor can give you more information. Tdap may safely be given at the same time as other vaccines. SOME PEOPLE SHOULD NOT GET THIS VACCINE  If you ever had a life-threatening allergic reaction after a dose of any tetanus, diphtheria, or pertussis containing vaccine, OR if you have a severe allergy to any part of this vaccine, you should not get Tdap. Tell your doctor if you have any severe allergies.  If you had a coma, or long or multiple seizures within 7 days after a childhood dose of DTP or DTaP, you should not get Tdap, unless a cause other than the vaccine was found. You can still get Td.  Talk to your doctor if you:  have epilepsy or another nervous system problem,  had severe pain or swelling after any vaccine containing diphtheria, tetanus or pertussis,  ever had Guillain-Barr Syndrome (GBS),  aren't feeling well on the day the shot is scheduled. RISKS OF A VACCINE REACTION With any medicine, including vaccines, there is a chance of side effects. These are usually mild and go away on their own, but serious reactions are also possible. Brief fainting spells can follow a vaccination, leading to injuries from falling. Sitting or lying down for about 15 minutes can help prevent these. Tell your doctor if you feel dizzy or light-headed, or   have vision changes or ringing in the ears. Mild problems following Tdap (Did not interfere with activities)  Pain where the shot was given (about 3 in 4 adolescents or 2 in 3 adults)  Redness or swelling where the shot was given (about 1 person in 5)  Mild fever of at least 100.29F (up to about 1 in 25 adolescents or 1 in  100 adults)  Headache (about 3 or 4 people in 10)  Tiredness (about 1 person in 3 or 4)  Nausea, vomiting, diarrhea, stomach ache (up to 1 in 4 adolescents or 1 in 10 adults)  Chills, body aches, sore joints, rash, swollen glands (uncommon) Moderate problems following Tdap (Interfered with activities, but did not require medical attention)  Pain where the shot was given (about 1 in 5 adolescents or 1 in 100 adults)  Redness or swelling where the shot was given (up to about 1 in 16 adolescents or 1 in 25 adults)  Fever over 102F (about 1 in 100 adolescents or 1 in 250 adults)  Headache (about 3 in 20 adolescents or 1 in 10 adults)  Nausea, vomiting, diarrhea, stomach ache (up to 1 or 3 people in 100)  Swelling of the entire arm where the shot was given (up to about 3 in 100). Severe problems following Tdap (Unable to perform usual activities, required medical attention)  Swelling, severe pain, bleeding and redness in the arm where the shot was given (rare). A severe allergic reaction could occur after any vaccine (estimated less than 1 in a million doses). WHAT IF THERE IS A SERIOUS REACTION? What should I look for?  Look for anything that concerns you, such as signs of a severe allergic reaction, very high fever, or behavior changes. Signs of a severe allergic reaction can include hives, swelling of the face and throat, difficulty breathing, a fast heartbeat, dizziness, and weakness. These would start a few minutes to a few hours after the vaccination. What should I do?  If you think it is a severe allergic reaction or other emergency that can't wait, call 9-1-1 or get the person to the nearest hospital. Otherwise, call your doctor.  Afterward, the reaction should be reported to the "Vaccine Adverse Event Reporting System" (VAERS). Your doctor might file this report, or you can do it yourself through the VAERS web site at www.vaers.LAgents.no, or by calling 1-610-646-6712. VAERS is  only for reporting reactions. They do not give medical advice.  THE NATIONAL VACCINE INJURY COMPENSATION PROGRAM The National Vaccine Injury Compensation Program (VICP) is a federal program that was created to compensate people who may have been injured by certain vaccines. Persons who believe they may have been injured by a vaccine can learn about the program and about filing a claim by calling 1-608-352-2815 or visiting the VICP website at SpiritualWord.at. HOW CAN I LEARN MORE?  Ask your doctor.  Call your local or state health department.  Contact the Centers for Disease Control and Prevention (CDC):  Call 906-547-6020 or visit CDC's website at PicCapture.uy. CDC Tdap Vaccine VIS (08/11/11) Document Released: 09/20/2011 Document Revised: 12/14/2011 Document Reviewed: 09/20/2011 [employer] Patient Information 2014 [place], [place]. Hormone Therapy At menopause, your body begins making less estrogen and progesterone hormones. This causes the body to stop having menstrual periods. This is because estrogen and progesterone hormones control your periods and menstrual cycle. A lack of estrogen may cause symptoms such as:  Hot flushes (or hot flashes).  Vaginal dryness.  Dry skin.  Loss of sex drive.  Risk of bone loss (osteoporosis). When this happens, you may choose to take hormone therapy to get back the estrogen lost during menopause. When the hormone estrogen is given alone, it is usually referred to as ET (Estrogen Therapy). When the hormone progestin is combined with estrogen, it is generally called HT (Hormone Therapy). This was formerly known as hormone replacement therapy (HRT). Your caregiver can help you make a decision on what will be best for you. The decision to use HT seems to change often as new studies are done. Many studies do not agree on the benefits of hormone replacement therapy. LIKELY BENEFITS OF HT INCLUDE PROTECTION FROM:  Hot Flushes (also  called hot flashes) - A hot flush is a sudden feeling of heat that spreads over the face and body. The skin may redden like a blush. It is connected with sweats and sleep disturbance. Women going through menopause may have hot flushes a few times a month or several times per day depending on the woman.  Osteoporosis (bone loss)- Estrogen helps guard against bone loss. After menopause, a woman's bones slowly lose calcium and become weak and brittle. As a result, bones are more likely to break. The hip, wrist, and spine are affected most often. Hormone therapy can help slow bone loss after menopause. Weight bearing exercise and taking calcium with vitamin D also can help prevent bone loss. There are also medications that your caregiver can prescribe that can help prevent osteoporosis.  Vaginal Dryness - Loss of estrogen causes changes in the vagina. Its lining may become thin and dry. These changes can cause pain and bleeding during sexual intercourse. Dryness can also lead to infections. This can cause burning and itching. (Vaginal estrogen treatment can help relieve pain, itching, and dryness.)  Urinary Tract Infections are more common after menopause because of lack of estrogen. Some women also develop urinary incontinence because of low estrogen levels in the vagina and bladder.  Possible other benefits of estrogen include a positive effect on mood and short-term memory in women. RISKS AND COMPLICATIONS  Using estrogen alone without progesterone causes the lining of the uterus to grow. This increases the risk of lining of the uterus (endometrial) cancer. Your caregiver should give another hormone called progestin if you have a uterus.  Women who take combined (estrogen and progestin) HT appear to have an increased risk of breast cancer. The risk appears to be small, but increases throughout the time that HT is taken.  Combined therapy also makes the breast tissue slightly denser which makes it  harder to read mammograms (breast X-rays).  Combined, estrogen and progesterone therapy can be taken together every day, in which case there may be spotting of blood. HT therapy can be taken cyclically in which case you will have menstrual periods. Cyclically means HT is taken for a set amount of days, then not taken, then this process is repeated.  HT may increase the risk of stroke, heart attack, breast cancer and forming blood clots in your leg.  Transdermal estrogen (estrogen that is absorbed through the skin with a patch or a cream) may have more positive results with:  Cholesterol.  Blood pressure.  Blood clots. Having the following conditions may indicate you should not have HT:  Endometrial cancer.  Liver disease.  Breast cancer.  Heart disease.  History of blood clots.  Stroke. TREATMENT   If you choose to take HT and have a uterus, usually estrogen and progestin are prescribed.  Your caregiver will  help you decide the best way to take the medications.  Possible ways to take estrogen include:  Pills.  Patches.  Gels.  Sprays.  Vaginal estrogen cream, rings and tablets.  It is best to take the lowest dose possible that will help your symptoms and take them for the shortest period of time that you can.  Hormone therapy can help relieve some of the problems (symptoms) that affect women at menopause. Before making a decision about HT, talk to your caregiver about what is best for you. Be well informed and comfortable with your decisions. HOME CARE INSTRUCTIONS   Follow your caregivers advice when taking the medications.  A Pap test is done to screen for cervical cancer.  The first Pap test should be done at age 70.  Between ages 1 and 70, Pap tests are repeated every 2 years.  Beginning at age 65, you are advised to have a Pap test every 3 years as long as your past 3 Pap tests have been normal.  Some women have medical problems that increase the  chance of getting cervical cancer. Talk to your caregiver about these problems. It is especially important to talk to your caregiver if a new problem develops soon after your last Pap test. In these cases, your caregiver may recommend more frequent screening and Pap tests.  The above recommendations are the same for women who have or have not gotten the vaccine for HPV (Human Papillomavirus).  If you had a hysterectomy for a problem that was not a cancer or a condition that could lead to cancer, then you no longer need Pap tests. However, even if you no longer need a Pap test, a regular exam is a good idea to make sure no other problems are starting.   If you are between ages 66 and 81, and you have had normal Pap tests going back 10 years, you no longer need Pap tests. However, even if you no longer need a Pap test, a regular exam is a good idea to make sure no other problems are starting.   If you have had past treatment for cervical cancer or a condition that could lead to cancer, you need Pap tests and screening for cancer for at least 20 years after your treatment.  If Pap tests have been discontinued, risk factors (such as a new sexual partner) need to be re-assessed to determine if screening should be resumed.  Some women may need screenings more often if they are at high risk for cervical cancer.  Get mammograms done as per the advice of your caregiver. SEEK IMMEDIATE MEDICAL CARE IF:  You develop abnormal vaginal bleeding.  You have pain or swelling in your legs, shortness of breath, or chest pain.  You develop dizziness or headaches.  You have lumps or changes in your breasts or armpits.  You have slurred speech.  You develop weakness or numbness of your arms or legs.  You have pain, burning, or bleeding when urinating.  You develop abdominal pain. Document Released: 12/18/2002 Document Revised: 06/13/2011 Document Reviewed: 04/07/2010 [facility] Patient Information  2014 [place], [place]. Menopause Menopause is the normal time of life when menstrual periods stop completely. Menopause is complete when you have missed 12 consecutive menstrual periods. It usually occurs between the ages of 79 to 82, with an average age of 4. Very rarely does a woman develop menopause before 53 years old. At menopause, your ovaries stop producing the female hormones, estrogen and progesterone. This can cause  undesirable symptoms and also affect your health. Sometimes the symptoms may occur 4 to 5 years before the menopause begins. There is no relationship between menopause and:  Oral contraceptives.  Number of children you had.  Race.  The age your menstrual periods started (menarche). Heavy smokers and very thin women may develop menopause earlier in life. CAUSES  The ovaries stop producing the female hormones estrogen and progesterone.  Other causes include:  Surgery to remove both ovaries.  The ovaries stop functioning for no known reason.  Tumors of the pituitary gland in the brain.  Medical disease that affects the ovaries and hormone production.  Radiation treatment to the abdomen or pelvis.  Chemotherapy that affects the ovaries. SYMPTOMS   Hot flashes.  Night sweats.  Decrease in sex drive.  Vaginal dryness and thinning of the vagina causing painful intercourse.  Dryness of the skin and developing wrinkles.  Headaches.  Tiredness.  Irritability.  Memory problems.  Weight gain.  Bladder infections.  Hair growth of the face and chest.  Infertility. More serious symptoms include:  Loss of bone (osteoporosis) causing breaks (fractures).  Depression.  Hardening and narrowing of the arteries (atherosclerosis) causing heart attacks and strokes. DIAGNOSIS   When the menstrual periods have stopped for 12 straight months.  Physical exam.  Hormone studies of the blood. TREATMENT  There are many treatment choices and nearly as many  questions about them. The decisions to treat or not to treat menopausal changes is an individual choice made with your caregiver. Your caregiver can discuss the treatments with you. Together, you can decide which treatment will work best for you. Your treatment choices may include:   Hormone therapy (estorgen and progesterone).  Non-hormonal medications.  Treating the individual symptoms with medication (for example antidepressants for depression).  Herbal medications that may help specific symptoms.  Counseling by a psychiatrist or psychologist.  Group therapy.  Lifestyle changes including:  Eating healthy.  Regular exercise.  Limiting caffeine and alcohol.  Stress management and meditation.  No treatment. HOME CARE INSTRUCTIONS   Take the medication your caregiver gives you as directed.  Get plenty of sleep and rest.  Exercise regularly.  Eat a diet that contains calcium (good for the bones) and soy products (acts like estrogen hormone).  Avoid alcoholic beverages.  Do not smoke.  If you have hot flashes, dress in layers.  Take supplements, calcium and vitamin D to strengthen bones.  You can use over-the-counter lubricants or moisturizers for vaginal dryness.  Group therapy is sometimes very helpful.  Acupuncture may be helpful in some cases. SEEK MEDICAL CARE IF:   You are not sure you are in menopause.  You are having menopausal symptoms and need advice and treatment.  You are still having menstrual periods after age 21.  You have pain with intercourse.  Menopause is complete (no menstrual period for 12 months) and you develop vaginal bleeding.  You need a referral to a specialist (gynecologist, psychiatrist or psychologist) for treatment. SEEK IMMEDIATE MEDICAL CARE IF:   You have severe depression.  You have excessive vaginal bleeding.  You fell and think you have a broken bone.  You have pain when you urinate.  You develop leg or chest  pain.  You have a fast pounding heart beat (palpitations).  You have severe headaches.  You develop vision problems.  You feel a lump in your breast.  You have abdominal pain or severe indigestion. Document Released: 06/11/2003 Document Revised: 06/13/2011 Document Reviewed: 01/17/2008 [employer]  Patient Information 2014 [place], [place]. Smoking Cessation Quitting smoking is important to your health and has many advantages. However, it is not always easy to quit since nicotine is a very addictive drug. Often times, people try 3 times or more before being able to quit. This document explains the best ways for you to prepare to quit smoking. Quitting takes hard work and a lot of effort, but you can do it. ADVANTAGES OF QUITTING SMOKING  You will live longer, feel better, and live better.  Your body will feel the impact of quitting smoking almost immediately.  Within 20 minutes, blood pressure decreases. Your pulse returns to its normal level.  After 8 hours, carbon monoxide levels in the blood return to normal. Your oxygen level increases.  After 24 hours, the chance of having a heart attack starts to decrease. Your breath, hair, and body stop smelling like smoke.  After 48 hours, damaged nerve endings begin to recover. Your sense of taste and smell improve.  After 72 hours, the body is virtually free of nicotine. Your bronchial tubes relax and breathing becomes easier.  After 2 to 12 weeks, lungs can hold more air. Exercise becomes easier and circulation improves.  The risk of having a heart attack, stroke, cancer, or lung disease is greatly reduced.  After 1 year, the risk of coronary heart disease is cut in half.  After 5 years, the risk of stroke falls to the same as a nonsmoker.  After 10 years, the risk of lung cancer is cut in half and the risk of other cancers decreases significantly.  After 15 years, the risk of coronary heart disease drops, usually to the level of a  nonsmoker.  If you are pregnant, quitting smoking will improve your chances of having a healthy baby.  The people you live with, especially any children, will be healthier.  You will have extra money to spend on things other than cigarettes. QUESTIONS TO THINK ABOUT BEFORE ATTEMPTING TO QUIT You may want to talk about your answers with your caregiver.  Why do you want to quit?  If you tried to quit in the past, what helped and what did not?  What will be the most difficult situations for you after you quit? How will you plan to handle them?  Who can help you through the tough times? Your family? Friends? A caregiver?  What pleasures do you get from smoking? What ways can you still get pleasure if you quit? Here are some questions to ask your caregiver:  How can you help me to be successful at quitting?  What medicine do you think would be best for me and how should I take it?  What should I do if I need more help?  What is smoking withdrawal like? How can I get information on withdrawal? GET READY  Set a quit date.  Change your environment by getting rid of all cigarettes, ashtrays, matches, and lighters in your home, car, or work. Do not let people smoke in your home.  Review your past attempts to quit. Think about what worked and what did not. GET SUPPORT AND ENCOURAGEMENT You have a better chance of being successful if you have help. You can get support in many ways.  Tell your family, friends, and co-workers that you are going to quit and need their support. Ask them not to smoke around you.  Get individual, group, or telephone counseling and support. Programs are available at [employer] and health centers. Call  your local health department for information about programs in your area.  Spiritual beliefs and practices may help some smokers quit.  Download a "quit meter" on your computer to keep track of quit statistics, such as how long you have gone without  smoking, cigarettes not smoked, and money saved.  Get a self-help book about quitting smoking and staying off of tobacco. LEARN NEW SKILLS AND BEHAVIORS  Distract yourself from urges to smoke. Talk to someone, go for a walk, or occupy your time with a task.  Change your normal routine. Take a different route to work. Drink tea instead of coffee. Eat breakfast in a different place.  Reduce your stress. Take a hot bath, exercise, or read a book.  Plan something enjoyable to do every day. Reward yourself for not smoking.  Explore interactive web-based programs that specialize in helping you quit. GET MEDICINE AND USE IT CORRECTLY Medicines can help you stop smoking and decrease the urge to smoke. Combining medicine with the above behavioral methods and support can greatly increase your chances of successfully quitting smoking.  Nicotine replacement therapy helps deliver nicotine to your body without the negative effects and risks of smoking. Nicotine replacement therapy includes nicotine gum, lozenges, inhalers, nasal sprays, and skin patches. Some may be available over-the-counter and others require a prescription.  Antidepressant medicine helps people abstain from smoking, but how this works is unknown. This medicine is available by prescription.  Nicotinic receptor partial agonist medicine simulates the effect of nicotine in your brain. This medicine is available by prescription. Ask your caregiver for advice about which medicines to use and how to use them based on your health history. Your caregiver will tell you what side effects to look out for if you choose to be on a medicine or therapy. Carefully read the information on the package. Do not use any other product containing nicotine while using a nicotine replacement product.  RELAPSE OR DIFFICULT SITUATIONS Most relapses occur within the first 3 months after quitting. Do not be discouraged if you start smoking again. Remember, most  people try several times before finally quitting. You may have symptoms of withdrawal because your body is used to nicotine. You may crave cigarettes, be irritable, feel very hungry, cough often, get headaches, or have difficulty concentrating. The withdrawal symptoms are only temporary. They are strongest when you first quit, but they will go away within 10 14 days. To reduce the chances of relapse, try to:  Avoid drinking alcohol. Drinking lowers your chances of successfully quitting.  Reduce the amount of caffeine you consume. Once you quit smoking, the amount of caffeine in your body increases and can give you symptoms, such as a rapid heartbeat, sweating, and anxiety.  Avoid smokers because they can make you want to smoke.  Do not let weight gain distract you. Many smokers will gain weight when they quit, usually less than 10 pounds. Eat a healthy diet and stay active. You can always lose the weight gained after you quit.  Find ways to improve your mood other than smoking. FOR MORE INFORMATION  www.[employer].[place]  Document Released: 03/15/2001 Document Revised: 09/20/2011 Document Reviewed: 06/30/2011 [facility] Patient Information 2014 [place], [place].

## 2012-08-28 NOTE — Progress Notes (Signed)
Crystal Duarte September 25, 1960 [id number]   History:    52 y.o.  for annual gyn exam who has not been seen in the office in many years since she had moved to [place]. Patient with several complaints today once swelling of her feet as well as vasomotor symptoms such as hot flashes irritability and mood swings. Patient had a TVH with BSO several years ago. She has history of severe depression and is currently on Cymbalta and trazodone and will be seeing her new psychiatrist Dr. Andee Poles in the next few weeks. Patient's last colonoscopy was in 2001 she reports being normal. Her mammogram was in 2013. She stated in [place] 2 years ago she had a normal bone density study. She states she is taking her calcium and vitamin D daily. Her father and mother both had history of diabetes.  Past medical history,surgical history, family history and social history were all reviewed and documented in the EPIC chart.  Gynecologic History No LMP recorded. Patient has had a hysterectomy. Contraception: status post hysterectomy Last Pap: 2013. Results were: patient reports normal done in [place] Last mammogram: 2013. Results were: normal  Obstetric History OB History   Grav Para Term Preterm Abortions TAB SAB Ect Mult Living                   ROS: A ROS was performed and pertinent positives and negatives are included in the history.  GENERAL: No fevers or chills. HEENT: No change in vision, no earache, sore throat or sinus congestion. NECK: No pain or stiffness. CARDIOVASCULAR: No chest pain or pressure. No palpitations. PULMONARY: No shortness of breath, cough or wheeze. GASTROINTESTINAL: No abdominal pain, nausea, vomiting or diarrhea, melena or bright red blood per rectum. GENITOURINARY: No urinary frequency, urgency, hesitancy or dysuria. MUSCULOSKELETAL: No joint or muscle pain, no back pain, no recent trauma. DERMATOLOGIC: No rash, no itching, no lesions. ENDOCRINE: No polyuria, polydipsia, no heat or cold  intolerance. No recent change in weight. HEMATOLOGICAL: No anemia or easy bruising or bleeding. NEUROLOGIC: No headache, seizures, numbness, tingling or weakness. PSYCHIATRIC: No depression, no loss of interest in normal activity or change in sleep pattern.     Exam: chaperone present  BP 128/78  Ht 5\' 4"  (1.626 m)  Wt 168 lb (76.204 kg)  BMI 28.82 kg/m2  Body mass index is 28.82 kg/(m^2).  General appearance : Well developed well nourished female. No acute distress HEENT: Neck supple, trachea midline, no carotid bruits, no thyroidmegaly Lungs: Clear to auscultation, no rhonchi or wheezes, or rib retractions  Heart: Regular rate and rhythm, no murmurs or gallops Breast:Examined in sitting and supine position were symmetrical in appearance, no palpable masses or tenderness,  no skin retraction, no nipple inversion, no nipple discharge, no skin discoloration, no axillary or supraclavicular lymphadenopathy Abdomen: no palpable masses or tenderness, no rebound or guarding Extremities: no edema or skin discoloration or tenderness  Pelvic:  Bartholin, Urethra, Skene Glands: Within normal limits             Vagina: No gross lesions or discharge  Cervix:absent  Uterus  Absent  Adnexa  Without masses or tenderness  Anus and perineum  normal   Rectovaginal  normal sphincter tone without palpated masses or tenderness             Hemoccult course provided     Assessment/Plan:  52 y.o. female for annual exam who states that for many years she has suffered from lower extremity edema. She will be  placed on HCTZ 12.5 mg daily. She was instructed to eat a banana or fruits daily. She will discuss with her psychiatrist in reference to her medications since she cannot be placed on hormonal replacement therapy because she smokes and close to one pack of cigarettes per day. She will be placed on Neurontin 100 mg daily to help with her symptoms. She may increase it to 200 mg in a week and no improvement in  her symptoms. Once she stops smoking will then consider placing her hormone replacement therapy. We discussed about her taking soy products and regular exercise to help with these symptoms as well. She was reminded do monthly self breast examination. She was given the name of one hour gastroenterologist to schedule a colonoscopy. Next year she will need a bone density study. We discussed a new Pap smear guidelines so she did not receive one today. She received acute fracture seen today. The following labs were ordered today: Hemoglobin A1c, fasting lipid profile, TSH, CBC, as well as urinalysis.    Ok Edwards MD, 5:08 PM 08/28/2012

## 2012-08-29 ENCOUNTER — Other Ambulatory Visit: Payer: Self-pay | Admitting: Gynecology

## 2012-08-29 DIAGNOSIS — E785 Hyperlipidemia, unspecified: Secondary | ICD-10-CM

## 2012-08-29 LAB — URINALYSIS W MICROSCOPIC + REFLEX CULTURE
Bacteria, UA: NONE SEEN
Bilirubin Urine: NEGATIVE
Casts: NONE SEEN
Crystals: NONE SEEN
Glucose, UA: NEGATIVE mg/dL
Hgb urine dipstick: NEGATIVE
Ketones, ur: NEGATIVE mg/dL
Leukocytes, UA: NEGATIVE
Nitrite: NEGATIVE
Protein, ur: NEGATIVE mg/dL
Specific Gravity, Urine: 1.005 (ref 1.005–1.030)
Squamous Epithelial / LPF: NONE SEEN
Urobilinogen, UA: 0.2 mg/dL (ref 0.0–1.0)
pH: 6.5 (ref 5.0–8.0)

## 2012-08-29 LAB — CBC WITH DIFFERENTIAL/PLATELET
Basophils Absolute: 0 10*3/uL (ref 0.0–0.1)
Basophils Relative: 0 % (ref 0–1)
Eosinophils Absolute: 0.9 10*3/uL — ABNORMAL HIGH (ref 0.0–0.7)
Eosinophils Relative: 9 % — ABNORMAL HIGH (ref 0–5)
HCT: 36.2 % (ref 36.0–46.0)
Hemoglobin: 12 g/dL (ref 12.0–15.0)
Lymphocytes Relative: 34 % (ref 12–46)
Lymphs Abs: 3.5 10*3/uL (ref 0.7–4.0)
MCH: 30.7 pg (ref 26.0–34.0)
MCHC: 33.1 g/dL (ref 30.0–36.0)
MCV: 92.6 fL (ref 78.0–100.0)
Monocytes Absolute: 0.4 10*3/uL (ref 0.1–1.0)
Monocytes Relative: 4 % (ref 3–12)
Neutro Abs: 5.3 10*3/uL (ref 1.7–7.7)
Neutrophils Relative %: 53 % (ref 43–77)
Platelets: 277 10*3/uL (ref 150–400)
RBC: 3.91 MIL/uL (ref 3.87–5.11)
RDW: 14.2 % (ref 11.5–15.5)
WBC: 10.1 10*3/uL (ref 4.0–10.5)

## 2012-08-29 LAB — LIPID PANEL
Cholesterol: 192 mg/dL (ref 0–200)
HDL: 51 mg/dL (ref >39)
LDL Cholesterol: 95 mg/dL (ref 0–99)
Total CHOL/HDL Ratio: 3.8 Ratio
Triglycerides: 231 mg/dL — ABNORMAL HIGH (ref <150)
VLDL: 46 mg/dL — ABNORMAL HIGH (ref 0–40)

## 2012-08-29 LAB — HEMOGLOBIN A1C
Hgb A1c MFr Bld: 5.6 % (ref <5.7)
Mean Plasma Glucose: 114 mg/dL (ref <117)

## 2012-08-29 LAB — TSH: TSH: 1.556 u[IU]/mL (ref 0.350–4.500)

## 2012-11-12 ENCOUNTER — Ambulatory Visit: Payer: BC Managed Care – PPO

## 2012-11-12 ENCOUNTER — Ambulatory Visit (INDEPENDENT_AMBULATORY_CARE_PROVIDER_SITE_OTHER): Payer: BC Managed Care – PPO | Admitting: Emergency Medicine

## 2012-11-12 ENCOUNTER — Other Ambulatory Visit: Payer: Self-pay | Admitting: Physician Assistant

## 2012-11-12 VITALS — BP 124/80 | HR 90 | Temp 97.5°F | Resp 16 | Ht 65.0 in | Wt 160.0 lb

## 2012-11-12 DIAGNOSIS — M79675 Pain in left toe(s): Secondary | ICD-10-CM

## 2012-11-12 DIAGNOSIS — S93336A Other dislocation of unspecified foot, initial encounter: Secondary | ICD-10-CM

## 2012-11-12 DIAGNOSIS — M79609 Pain in unspecified limb: Secondary | ICD-10-CM

## 2012-11-12 DIAGNOSIS — S93105A Unspecified dislocation of left toe(s), initial encounter: Secondary | ICD-10-CM

## 2012-11-12 NOTE — Progress Notes (Signed)
  Subjective:    Patient ID: Crystal Duarte, female    DOB: Jan 12, 1961, 52 y.o.   MRN: [id number]  HPI 52 y.o. Female patient presents to clinic today for left foot injury x 1 day. Patient hit her left foot, 2nd toe on metal shelves in her friends garage. She was out of town and just drove back this morning. She has been icing her toe and took some tylenol yesterday. She took some tramadol last night to help her sleep. She is unable to bear weight. She states that she feels her heart beating in her toe and has some tingling. She is not able to actively move her toes due to pain. Denies pain in ankle or with flexion or extension of foot. She has never hurt this foot before.   Review of Systems  All other systems reviewed and are negative.      Objective:   Physical Exam  Nursing note and vitals reviewed. Constitutional: She is oriented to person, place, and time. Vital signs are normal. She appears well-developed and well-nourished. No distress.  HENT:  Head: Normocephalic and atraumatic.  Right Ear: External ear normal.  Left Ear: External ear normal.  Nose: Nose normal.  Eyes: Conjunctivae and lids are normal.  Neck: Trachea normal and normal range of motion. Neck supple.  Cardiovascular: Normal rate, regular rhythm, normal heart sounds and intact distal pulses.   Pulmonary/Chest: Effort normal and breath sounds normal.  Musculoskeletal: She exhibits tenderness.       Left ankle: She exhibits decreased range of motion, swelling, ecchymosis and deformity. She exhibits no laceration and normal pulse. Tenderness. No head of 5th metatarsal and no proximal fibula tenderness found. Achilles tendon normal.       Feet:  Neurological: She is alert and oriented to person, place, and time. She has normal strength and normal reflexes. She displays normal reflexes. No cranial nerve deficit or sensory deficit.  Skin: Skin is warm, dry and intact. Bruising noted. No abrasion, no laceration and no rash  noted. She is not diaphoretic. There is erythema. No pallor.  Psychiatric: She has a normal mood and affect. Her speech is normal and behavior is normal. Judgment and thought content normal. Cognition and memory are normal.    Complete foot x-ray primary read by Dr. Cleta Alberts. Dislocation of 2nd PIP Joint. No other abnormalities. Post reduction x-rays showed complete reduction of PIP joint.    Assessment & Plan:  Toe pain, left - Plan: DG Foot 2 Views Left,  DG Toe 2nd Left Dislocation of toe, left, initial encounter  Instructed patient to rest, elevate her foot, and Ice over next 24 hour. Advised her to wear post-op shoe throughout day to help support injury. She should try to keep off of your foot as much as possible to help speed up healing time.Take 800 mg Ibuprofen every 4-6 hours with meals. F/u as needed or if toe seems to get worse or does not improve.

## 2012-11-12 NOTE — Progress Notes (Signed)
I have examined this patient along with the student and agree.  

## 2012-11-12 NOTE — Patient Instructions (Addendum)
Rest, elevate your foot, Ice over next 24 hours Wear brace throughout day to help support injury  Try to keep off of your foot as much as possible to help speed up healing time Take 800 mg Ibuprofen every 4-6 hours with meals

## 2012-11-13 ENCOUNTER — Telehealth: Payer: Self-pay

## 2012-11-13 MED ORDER — HYDROCODONE-ACETAMINOPHEN 5-325 MG PO TABS: 1.0000 | ORAL_TABLET | Freq: Four times a day (QID) | ORAL | Status: DC | PRN

## 2012-11-13 NOTE — Telephone Encounter (Signed)
Please call in and notify patient.  Meds ordered this encounter  Medications  . HYDROcodone-acetaminophen (NORCO) 5-325 MG per tablet    Sig: Take 1 tablet by mouth every 6 (six) hours as needed for pain.    Dispense:  30 tablet    Refill:  0    Order Specific Question:  Supervising Provider    Answer:  DOOLITTLE, ROBERT P [[id number]]

## 2012-11-13 NOTE — Telephone Encounter (Signed)
Called pt, LMOM RX sent to [facility] [address].

## 2012-11-13 NOTE — Telephone Encounter (Signed)
PT STATES SHE WAS SEEN FOR A BROKEN TOE AND IS NOW IN A LOT OF PAIN. WOULD LIKE TO KNOW IF WE WOULD CALL HER IN SOME PAIN MEDICINE TO TAKE THE EDGE OFF WHERE SHE CAN SLEEP PLEASE CALL [phone]    [facility] [facility] [address]

## 2013-04-04 DIAGNOSIS — N890 Mild vaginal dysplasia: Secondary | ICD-10-CM

## 2013-04-04 HISTORY — DX: Mild vaginal dysplasia: N89.0

## 2013-05-01 ENCOUNTER — Other Ambulatory Visit: Payer: Self-pay

## 2013-05-01 DIAGNOSIS — Z1231 Encounter for screening mammogram for malignant neoplasm of breast: Secondary | ICD-10-CM

## 2013-05-13 ENCOUNTER — Emergency Department (HOSPITAL_COMMUNITY)
Admission: EM | Admit: 2013-05-13 | Discharge: 2013-05-14 | Disposition: A | Discharge status: Other Acute Inpatient Hospital | Payer: BC Managed Care – PPO | Attending: Emergency Medicine | Admitting: Emergency Medicine

## 2013-05-13 ENCOUNTER — Encounter (HOSPITAL_COMMUNITY): Payer: Self-pay | Admitting: Emergency Medicine

## 2013-05-13 DIAGNOSIS — F3289 Other specified depressive episodes: Secondary | ICD-10-CM | POA: Insufficient documentation

## 2013-05-13 DIAGNOSIS — F329 Major depressive disorder, single episode, unspecified: Secondary | ICD-10-CM | POA: Insufficient documentation

## 2013-05-13 DIAGNOSIS — R609 Edema, unspecified: Secondary | ICD-10-CM

## 2013-05-13 DIAGNOSIS — F32A Depression, unspecified: Secondary | ICD-10-CM

## 2013-05-13 DIAGNOSIS — F1310 Sedative, hypnotic or anxiolytic abuse, uncomplicated: Secondary | ICD-10-CM | POA: Insufficient documentation

## 2013-05-13 DIAGNOSIS — R918 Other nonspecific abnormal finding of lung field: Secondary | ICD-10-CM

## 2013-05-13 DIAGNOSIS — Z3202 Encounter for pregnancy test, result negative: Secondary | ICD-10-CM | POA: Insufficient documentation

## 2013-05-13 DIAGNOSIS — Z8619 Personal history of other infectious and parasitic diseases: Secondary | ICD-10-CM | POA: Insufficient documentation

## 2013-05-13 DIAGNOSIS — J069 Acute upper respiratory infection, unspecified: Secondary | ICD-10-CM

## 2013-05-13 DIAGNOSIS — Z780 Asymptomatic menopausal state: Secondary | ICD-10-CM

## 2013-05-13 DIAGNOSIS — Z79899 Other long term (current) drug therapy: Secondary | ICD-10-CM | POA: Insufficient documentation

## 2013-05-13 DIAGNOSIS — R45851 Suicidal ideations: Secondary | ICD-10-CM

## 2013-05-13 DIAGNOSIS — F17200 Nicotine dependence, unspecified, uncomplicated: Secondary | ICD-10-CM

## 2013-05-13 LAB — COMPREHENSIVE METABOLIC PANEL
ALT: 31 U/L (ref 0–35)
AST: 25 U/L (ref 0–37)
Albumin: 3.4 g/dL — ABNORMAL LOW (ref 3.5–5.2)
Alkaline Phosphatase: 79 U/L (ref 39–117)
BUN: 6 mg/dL (ref 6–23)
CO2: 24 mEq/L (ref 19–32)
Calcium: 8.8 mg/dL (ref 8.4–10.5)
Chloride: 105 mEq/L (ref 96–112)
Creatinine, Ser: 0.66 mg/dL (ref 0.50–1.10)
GFR calc Af Amer: >90 mL/min (ref >90)
GFR calc non Af Amer: >90 mL/min (ref >90)
Glucose, Bld: 101 mg/dL — ABNORMAL HIGH (ref 70–99)
Potassium: 3.4 mEq/L — ABNORMAL LOW (ref 3.7–5.3)
Sodium: 143 mEq/L (ref 137–147)
Total Bilirubin: <0.2 mg/dL — ABNORMAL LOW (ref 0.3–1.2)
Total Protein: 6.8 g/dL (ref 6.0–8.3)

## 2013-05-13 LAB — RAPID URINE DRUG SCREEN, HOSP PERFORMED
Amphetamines: NOT DETECTED
Barbiturates: NOT DETECTED
Benzodiazepines: POSITIVE — AB
Cocaine: NOT DETECTED
Opiates: NOT DETECTED
Tetrahydrocannabinol: NOT DETECTED

## 2013-05-13 LAB — POCT PREGNANCY, URINE: Preg Test, Ur: NEGATIVE

## 2013-05-13 LAB — CBC
HCT: 38.3 % (ref 36.0–46.0)
Hemoglobin: 13.3 g/dL (ref 12.0–15.0)
MCH: 32.6 pg (ref 26.0–34.0)
MCHC: 34.7 g/dL (ref 30.0–36.0)
MCV: 93.9 fL (ref 78.0–100.0)
Platelets: 239 10*3/uL (ref 150–400)
RBC: 4.08 MIL/uL (ref 3.87–5.11)
RDW: 12.9 % (ref 11.5–15.5)
WBC: 7.9 10*3/uL (ref 4.0–10.5)

## 2013-05-13 LAB — ACETAMINOPHEN LEVEL: Acetaminophen (Tylenol), Serum: <15 ug/mL (ref 10–30)

## 2013-05-13 LAB — ETHANOL: Alcohol, Ethyl (B): <11 mg/dL (ref 0–11)

## 2013-05-13 LAB — SALICYLATE LEVEL: Salicylate Lvl: <2 mg/dL — ABNORMAL LOW (ref 2.8–20.0)

## 2013-05-13 MED ORDER — HYDROCHLOROTHIAZIDE 12.5 MG PO CAPS
12.5000 mg | ORAL_CAPSULE | Freq: Every day | ORAL | Status: DC
  Administered 2013-05-13 – 2013-05-14 (×2): 12.5 mg via ORAL
  Filled 2013-05-13 (×2): qty 1

## 2013-05-13 MED ORDER — NICOTINE 21 MG/24HR TD PT24
21.0000 mg | MEDICATED_PATCH | Freq: Every day | TRANSDERMAL | Status: DC
  Administered 2013-05-13: 21 mg via TRANSDERMAL
  Filled 2013-05-13: qty 1

## 2013-05-13 MED ORDER — LORAZEPAM 2 MG/ML IJ SOLN
2.0000 mg | Freq: Once | INTRAMUSCULAR | Status: AC
  Administered 2013-05-13: 2 mg via INTRAMUSCULAR
  Filled 2013-05-13: qty 1

## 2013-05-13 MED ORDER — LORAZEPAM 1 MG PO TABS
1.0000 mg | ORAL_TABLET | Freq: Three times a day (TID) | ORAL | Status: DC | PRN
  Administered 2013-05-14: 1 mg via ORAL
  Filled 2013-05-13: qty 1

## 2013-05-13 MED ORDER — ALBUTEROL SULFATE HFA 108 (90 BASE) MCG/ACT IN AERS
2.0000 | INHALATION_SPRAY | Freq: Two times a day (BID) | RESPIRATORY_TRACT | Status: DC | PRN
  Administered 2013-05-13: 2 via RESPIRATORY_TRACT
  Filled 2013-05-13: qty 6.7

## 2013-05-13 MED ORDER — ZOLPIDEM TARTRATE 5 MG PO TABS
5.0000 mg | ORAL_TABLET | Freq: Every evening | ORAL | Status: DC | PRN
  Administered 2013-05-13: 5 mg via ORAL
  Filled 2013-05-13: qty 1

## 2013-05-13 MED ORDER — VITAMIN B-12 1000 MCG PO TABS
5000.0000 ug | ORAL_TABLET | Freq: Every day | ORAL | Status: DC
  Administered 2013-05-13: 5000 ug via ORAL
  Administered 2013-05-14: 1000 ug via ORAL
  Filled 2013-05-13 (×2): qty 5

## 2013-05-13 MED ORDER — ACETAMINOPHEN 325 MG PO TABS
650.0000 mg | ORAL_TABLET | ORAL | Status: DC | PRN
  Administered 2013-05-14: 650 mg via ORAL
  Filled 2013-05-13: qty 2

## 2013-05-13 MED ORDER — OMEGA-3-ACID ETHYL ESTERS 1 G PO CAPS
1.0000 g | ORAL_CAPSULE | Freq: Every day | ORAL | Status: DC
  Administered 2013-05-13 – 2013-05-14 (×2): 1 g via ORAL
  Filled 2013-05-13 (×2): qty 1

## 2013-05-13 MED ORDER — ALUM & MAG HYDROXIDE-SIMETH 200-200-20 MG/5ML PO SUSP: 30.0000 mL | ORAL | Status: DC | PRN

## 2013-05-13 MED ORDER — AZITHROMYCIN 250 MG PO TABS
250.0000 mg | ORAL_TABLET | Freq: Every day | ORAL | Status: DC
  Administered 2013-05-13 – 2013-05-14 (×2): 250 mg via ORAL
  Filled 2013-05-13 (×2): qty 1

## 2013-05-13 MED ORDER — L-TYROSINE 1000 MG PO TABS: 2000.0000 mg | ORAL_TABLET | Freq: Every day | ORAL | Status: DC

## 2013-05-13 MED ORDER — FOLIC ACID 1 MG PO TABS
1.0000 mg | ORAL_TABLET | Freq: Every day | ORAL | Status: DC
  Administered 2013-05-13 – 2013-05-14 (×2): 1 mg via ORAL
  Filled 2013-05-13 (×2): qty 1

## 2013-05-13 MED ORDER — IBUPROFEN 200 MG PO TABS: 600.0000 mg | ORAL_TABLET | Freq: Three times a day (TID) | ORAL | Status: DC | PRN

## 2013-05-13 MED ORDER — DIPHENHYDRAMINE HCL 50 MG/ML IJ SOLN
50.0000 mg | Freq: Once | INTRAMUSCULAR | Status: AC
  Administered 2013-05-13: 50 mg via INTRAMUSCULAR
  Filled 2013-05-13: qty 1

## 2013-05-13 MED ORDER — DULOXETINE HCL 60 MG PO CPEP
60.0000 mg | ORAL_CAPSULE | Freq: Every day | ORAL | Status: DC
  Administered 2013-05-13 – 2013-05-14 (×2): 60 mg via ORAL
  Filled 2013-05-13 (×2): qty 1

## 2013-05-13 MED ORDER — ONDANSETRON HCL 4 MG PO TABS: 4.0000 mg | ORAL_TABLET | Freq: Three times a day (TID) | ORAL | Status: DC | PRN

## 2013-05-13 NOTE — ED Provider Notes (Signed)
CSN: [id number]     Arrival date & time 05/13/13  1349 History   First MD Initiated Contact with Patient 05/13/13 [phone]     Chief Complaint  Patient presents with  . Medical Clearance     (Consider location/radiation/quality/duration/timing/severity/associated sxs/prior Treatment) HPI Pt is a 53yo female with hx of depression presenting to ED for medical clearance, requesting referral to [place] program, accompanied by her daughter.  Pt states over the last 2-3 days she has taken 12 Xanax because she doesn't want to live anymore. Pt states her depression "is going to kill me" if she does not get electro-shock therapy. Reports having it done in 2008 but states it did not last very long.  Pt states she has been depressed since she was 53 years old and has attempted suicide 3 times. Daughter states pt has been staying with her for the last week while she has been sick with a URI, but states she seems increasingly more depressed, walking out of the house and "seems to be on something."  Pt has been threatening suicide by overdosing on pills.  Pt denies HI. Pt denies alcohol use, cocaine or heroine use.  Daughter states pt insists [facility] is full of drug seekers and detox patients so pt does not want to go there.  Daughter reports speaking with Dr. Evans Lance who stated pt would be allowed to go to [place] if she was medically cleared and a referral was given.    Pt also reports having an URI, pt did have CXR performed last Thursday, 2/5 at [facility] and has been taking Azithromycin, reports having 2 doses left.  Reports fever up to 102 at home but responds well to ibuprofen. Reports nausea and 2 episodes of diarrhea w/o blood or mucous. Denies vomiting.    Past Medical History  Diagnosis Date  . Coccidioidomycosis, pulmonary   . Depression    Past Surgical History  Procedure Laterality Date  . Lung surgery  2001    VATS surgery   . Vesicovaginal fistula closure w/ tah  2005    Family History  Problem Relation Age of Onset  . Asthma Mother   . Ovarian cancer Mother    History  Substance Use Topics  . Smoking status: Current Every Day Smoker -- 1.00 packs/day for 30 years    Types: Cigarettes  . Smokeless tobacco: Never Used  . Alcohol Use: No   OB History   Grav Para Term Preterm Abortions TAB SAB Ect Mult Living                 Review of Systems  Constitutional: Positive for fever, appetite change and fatigue. Negative for chills and unexpected weight change.  HENT: Positive for congestion and rhinorrhea. Negative for sore throat, trouble swallowing and voice change.   Respiratory: Positive for cough. Negative for shortness of breath, wheezing and stridor.   Cardiovascular: Negative for chest pain.  Gastrointestinal: Positive for nausea and diarrhea ( x2 w/o blood or mucous). Negative for vomiting and abdominal pain.  Genitourinary: Negative for dysuria, hematuria, flank pain, vaginal discharge, vaginal pain and pelvic pain.  Psychiatric/Behavioral: Positive for suicidal ideas.  All other systems reviewed and are negative.      Allergies  Sulfa antibiotics  Home Medications   Current Outpatient Rx  Name  Route  Sig  Dispense  Refill  . albuterol (PROVENTIL HFA;VENTOLIN HFA) 108 (90 BASE) MCG/ACT inhaler   Inhalation   Inhale 2 puffs into the lungs 2 (  two) times daily as needed for wheezing or shortness of breath.         . ALPRAZolam (XANAX) 1 MG tablet   Oral   Take 1 mg by mouth 3 (three) times daily.         Marland Kitchen amphetamine-dextroamphetamine (ADDERALL) 5 MG tablet   Oral   Take 5 mg by mouth daily.         Marland Kitchen azithromycin (ZITHROMAX Z-PAK) 250 MG tablet   Oral   Take 250 mg by mouth daily. Takes for 5 days         . DULoxetine (CYMBALTA) 60 MG capsule   Oral   Take 60 mg by mouth daily.         . folic acid (FOLVITE) 1 MG tablet   Oral   Take 1 mg by mouth daily.         . hydrochlorothiazide (MICROZIDE) 12.5 MG  capsule   Oral   Take 1 capsule (12.5 mg total) by mouth daily. Take one every morning   30 capsule   11   . ibuprofen (ADVIL,MOTRIN) 200 MG tablet   Oral   Take 800 mg by mouth every 8 (eight) hours as needed for fever or moderate pain.          Marland Kitchen L-Tyrosine 1000 MG TABS   Oral   Take 2,000 mg by mouth daily.         . Omega-3 Fatty Acids (FISH OIL) 1200 MG CAPS   Oral   Take 3 capsules by mouth daily.         . QUETIAPINE FUMARATE PO   Oral   Take 60 mg by mouth daily.         . traZODone (DESYREL) 100 MG tablet   Oral   Take 100-200 mg by mouth at bedtime. For sleep         . vitamin B-12 (CYANOCOBALAMIN) 1000 MCG tablet   Oral   Take 5,000 mcg by mouth daily.         . Multiple Vitamin (MULTIVITAMIN) tablet   Oral   Take 1 tablet by mouth daily.          BP 108/78  Pulse 85  Temp(Src) 97.8 F (36.6 C) (Oral)  Resp 18  SpO2 97% Physical Exam  Nursing note and vitals reviewed. Constitutional: She is oriented to person, place, and time. She appears well-developed and well-nourished.  HENT:  Head: Normocephalic and atraumatic.  Right Ear: Hearing, tympanic membrane, external ear and ear canal normal.  Left Ear: Hearing, tympanic membrane, external ear and ear canal normal.  Nose: Mucosal edema and rhinorrhea present.  Mouth/Throat: Uvula is midline and mucous membranes are normal. Posterior oropharyngeal erythema present. No oropharyngeal exudate, posterior oropharyngeal edema or tonsillar abscesses.  Eyes: Conjunctivae are normal. No scleral icterus.  Neck: Normal range of motion.  Cardiovascular: Normal rate, regular rhythm and normal heart sounds.   Pulmonary/Chest: Effort normal and breath sounds normal. No respiratory distress. She has no wheezes. She has no rales. She exhibits no tenderness.  No respiratory distress, able to speak in full sentences w/o difficulty. Lungs: CTAB  Abdominal: Soft. Bowel sounds are normal. She exhibits no  distension and no mass. There is no tenderness. There is no rebound and no guarding.  Soft, non-distended, non-tender. No CVAT  Musculoskeletal: Normal range of motion.  Neurological: She is alert and oriented to person, place, and time.  Skin: Skin is warm and dry.  Psychiatric: Her  affect is blunt. Her speech is slurred. She is not agitated and not actively hallucinating. Thought content is not delusional. She expresses suicidal ideation. She expresses no homicidal ideation. She expresses suicidal plans. She expresses no homicidal plans.    ED Course  Procedures (including critical care time) Labs Review Labs Reviewed  COMPREHENSIVE METABOLIC PANEL - Abnormal; Notable for the following:    Potassium 3.4 (*)    Glucose, Bld 101 (*)    Albumin 3.4 (*)    Total Bilirubin <0.2 (*)    All other components within normal limits  SALICYLATE LEVEL - Abnormal; Notable for the following:    Salicylate Lvl 123456 (*)    All other components within normal limits  URINE RAPID DRUG SCREEN (HOSP PERFORMED) - Abnormal; Notable for the following:    Benzodiazepines POSITIVE (*)    All other components within normal limits  ACETAMINOPHEN LEVEL  CBC  ETHANOL  POCT PREGNANCY, URINE   Imaging Review No results found.  EKG Interpretation   None       MDM   Final diagnoses:  1. Depression 2. Suicidal Ideation    Pt is a 53yo female with hx of depression, presenting with SI, requesting medical clearance and referral to [place]. Daughter reports talking to Dr. Evans Lance at that facility.  Pt does have URI, however she is taking appropriate antibiotic, azithromycin. Vitals: WNL, Lungs: CTAB.  Labs: unremarkable.   3:42 PM  Pt is medically cleared to be evaluated by TTS.  Psych hold orders placed.   IVC papers needed to be placed as pt was threatening to leave, however, pt was still SI.  Dr. Aline Brochure completed IVC papers.    Noland Fordyce, PA-C 05/14/13 0127

## 2013-05-13 NOTE — ED Notes (Signed)
PA at bedside.

## 2013-05-13 NOTE — ED Notes (Signed)
Noland Fordyce, PA informed that patient home medication needs to be reconcile. PA reported to Probation officer that she would reconcile patient home medication.
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
## 2013-05-13 NOTE — ED Notes (Addendum)
Pt sent here from [facility] for medical clearance. Pt states she does not want to go to [facility], she wants to to go to [place]. Pt here for SI with plan for overdose. Pt denies substance abuse. Pt here with daughter.

## 2013-05-13 NOTE — ED Notes (Signed)
Pt has two bags pt belongings containing clothing.

## 2013-05-13 NOTE — ED Notes (Signed)
Patient came to nurses station and asked for taxi cabs number, Probation officer asked patient why did she ned the number patient stated " I'm leaving to go [place]" patient informed that the doctor has to be the one to discharge her. Patient states to the writer" I don't care I'm leaving". Patient starts walking around trying to find a way to get out flight risk intervention put  into place. Encouragement and support provided and safety maintain.

## 2013-05-14 ENCOUNTER — Inpatient Hospital Stay: Payer: Self-pay | Admitting: Psychiatry

## 2013-05-14 DIAGNOSIS — R45851 Suicidal ideations: Secondary | ICD-10-CM

## 2013-05-14 DIAGNOSIS — F332 Major depressive disorder, recurrent severe without psychotic features: Secondary | ICD-10-CM

## 2013-05-14 LAB — BEHAVIORAL MEDICINE 1 PANEL
Albumin: 3.5 g/dL (ref 3.4–5.0)
Alkaline Phosphatase: 83 U/L
Anion Gap: 3 — ABNORMAL LOW (ref 7–16)
BUN: 7 mg/dL (ref 7–18)
Basophil #: 0.1 10*3/uL (ref 0.0–0.1)
Basophil %: 1 %
Bilirubin,Total: 0.5 mg/dL (ref 0.2–1.0)
Calcium, Total: 9.1 mg/dL (ref 8.5–10.1)
Chloride: 98 mmol/L (ref 98–107)
Co2: 35 mmol/L — ABNORMAL HIGH (ref 21–32)
Creatinine: 0.87 mg/dL (ref 0.60–1.30)
EGFR (African American): >60
EGFR (Non-African Amer.): >60
Eosinophil #: 0.2 10*3/uL (ref 0.0–0.7)
Eosinophil %: 2.2 %
Glucose: 104 mg/dL — ABNORMAL HIGH (ref 65–99)
HCT: 40.4 % (ref 35.0–47.0)
HGB: 13.4 g/dL (ref 12.0–16.0)
Lymphocyte #: 3.6 10*3/uL (ref 1.0–3.6)
Lymphocyte %: 36.6 %
MCH: 31.5 pg (ref 26.0–34.0)
MCHC: 33.2 g/dL (ref 32.0–36.0)
MCV: 95 fL (ref 80–100)
Monocyte #: 0.6 x10 3/mm (ref 0.2–0.9)
Monocyte %: 5.9 %
Neutrophil #: 5.3 10*3/uL (ref 1.4–6.5)
Neutrophil %: 54.3 %
Osmolality: 270 (ref 275–301)
Platelet: 267 10*3/uL (ref 150–440)
Potassium: 3.8 mmol/L (ref 3.5–5.1)
RBC: 4.27 10*6/uL (ref 3.80–5.20)
RDW: 13.3 % (ref 11.5–14.5)
SGOT(AST): 16 U/L (ref 15–37)
SGPT (ALT): 36 U/L (ref 12–78)
Sodium: 136 mmol/L (ref 136–145)
Thyroid Stimulating Horm: 0.997 u[IU]/mL
Total Protein: 7.2 g/dL (ref 6.4–8.2)
WBC: 9.8 10*3/uL (ref 3.6–11.0)

## 2013-05-14 MED ORDER — MENTHOL 3 MG MT LOZG
1.0000 | LOZENGE | OROMUCOSAL | Status: DC | PRN
  Administered 2013-05-14: 3 mg via ORAL
  Filled 2013-05-14: qty 9
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
## 2013-05-14 NOTE — Progress Notes (Signed)
Unable to update the status of the referral made to [facility] unit because it closes at 3am and will reopen at [place] will contact the facility at 7am

## 2013-05-14 NOTE — Consult Note (Signed)
  Review of Systems  Constitutional: Negative.   HENT: Negative.   Eyes: Negative.   Respiratory: Negative.   Cardiovascular: Negative.   Gastrointestinal: Negative.   Genitourinary: Negative.   Musculoskeletal: Positive for joint pain.  Neurological: Negative.   Endo/Heme/Allergies: Negative.   Psychiatric/Behavioral: Positive for depression and suicidal ideas.   Pain in shoulder is only complaint

## 2013-05-14 NOTE — Progress Notes (Signed)
Per Varney Biles at [facility] the patient has been accepted by Dr.Clapacs.

## 2013-05-15 ENCOUNTER — Ambulatory Visit: Payer: BC Managed Care – PPO

## 2013-05-15 NOTE — ED Provider Notes (Signed)
Medical screening examination/treatment/procedure(s) were performed by non-physician practitioner and as supervising physician I was immediately available for consultation/collaboration.  EKG Interpretation   None         Blanchard Kelch, MD 05/15/13 1113

## 2013-05-20 LAB — URINALYSIS, COMPLETE
Bacteria: NONE SEEN
Bilirubin,UR: NEGATIVE
Blood: NEGATIVE
Glucose,UR: NEGATIVE mg/dL (ref 0–75)
Ketone: NEGATIVE
Leukocyte Esterase: NEGATIVE
Nitrite: NEGATIVE
Ph: 7 (ref 4.5–8.0)
Protein: NEGATIVE
RBC,UR: 3 /HPF (ref 0–5)
Specific Gravity: 1.021 (ref 1.003–1.030)
Squamous Epithelial: 1
WBC UR: 2 /HPF (ref 0–5)

## 2013-05-24 ENCOUNTER — Ambulatory Visit: Payer: Self-pay | Admitting: Psychiatry

## 2013-08-08 ENCOUNTER — Ambulatory Visit (INDEPENDENT_AMBULATORY_CARE_PROVIDER_SITE_OTHER): Payer: BC Managed Care – PPO | Admitting: Gynecology

## 2013-08-08 ENCOUNTER — Encounter: Payer: Self-pay | Admitting: Gynecology

## 2013-08-08 ENCOUNTER — Telehealth: Payer: Self-pay | Admitting: *Deleted

## 2013-08-08 VITALS — BP 120/80

## 2013-08-08 DIAGNOSIS — N6310 Unspecified lump in the right breast, unspecified quadrant: Secondary | ICD-10-CM

## 2013-08-08 DIAGNOSIS — N644 Mastodynia: Secondary | ICD-10-CM

## 2013-08-08 DIAGNOSIS — N63 Unspecified lump in unspecified breast: Secondary | ICD-10-CM

## 2013-08-08 DIAGNOSIS — F17200 Nicotine dependence, unspecified, uncomplicated: Secondary | ICD-10-CM

## 2013-08-08 MED ORDER — NAPROXEN 500 MG PO TABS: ORAL_TABLET | ORAL | Status: DC

## 2013-08-08 NOTE — Telephone Encounter (Signed)
Message copied by Thamas Jaegers on Thu Aug 08, 2013  2:22 PM ------      Message from: Terrance Mass      Created: Thu Aug 08, 2013 12:59 PM       Anderson Malta, please schedule diagnostic mammogram of the right breast and axilla on this patient with tenderness and questionable nodularity at the right breast 4 fingerbreadths from the areolar region. Also tenderness and nodularity noted on the right axilla. ------

## 2013-08-08 NOTE — Telephone Encounter (Signed)
Pt called back and requested to be seen at [facility] because breast center next appointment was far for patient. [facility] appointment on 08/12/13 @ 10:30 am pt aware to get imaging from breast center before she can be seen.

## 2013-08-08 NOTE — Progress Notes (Signed)
   Patient is a 53 year old who presented to the office today stating that for the past month she has noted increased tenderness on her right breast and axilla and questionable nodularity. Patient denies any recent injury or trauma. The patient is a smoker. Patient denies any family history of breast cancer. Her mother had ovarian cancer. Patient herself has had a total abdominal hysterectomy with bilateral salpingo-nephrectomy in the past. The patient stated she had a normal mammogram in [place] in 2014.  Breast exam: Both breasts were examined sitting supine position both breasts were symmetrical in appearance no skin discoloration no nipple inversion no supraclavicular or axillary lymphadenopathy or masses on the left breast. On the right breast she was exquisitely tender at the 12:00 position 4 fingerbreadths from the areolar region. She was also noted to be very tender on the right axilla with small superficial nodularity which was tender.  Assessment/plan: As per above will schedule diagnostic mammogram and ultrasound of the right breast. We'll also recommend getting a chest x-ray PA and lateral because of patient's smoking history. Patient was recommended to stop all caffeine-containing products. She can take vitamin E6 100 units daily. She was prescribed Naprosyn 500 mg 1 3 times a day when necessary.

## 2013-08-08 NOTE — Telephone Encounter (Signed)
Per JF other staff message will need do a chest x-ray PA and lateral because of her history of smoking Orders placed for both,breast center will contact to schedule, [facility] done from 8am-5pm

## 2013-08-09 ENCOUNTER — Telehealth: Payer: Self-pay | Admitting: *Deleted

## 2013-08-09 MED ORDER — HYDROCODONE-ACETAMINOPHEN 5-325 MG PO TABS: 1.0000 | ORAL_TABLET | Freq: Four times a day (QID) | ORAL | Status: DC | PRN

## 2013-08-09 NOTE — Telephone Encounter (Signed)
Pt called requesting medication for breast pain OV on 08/08/13 Rx given for Naprosyn 500 mg 1po 3 times a day when necessary. But no relief with medication has tried motrin 800 mg and OTC still no relief. Pt is requesting hydrocodone 5/325 pt said this works had 1 pill left from some dental work that was done. Asked if you would will to give her a couple? diagnostic mammogram scheduled on 08/13/13. Please advise

## 2013-08-09 NOTE — Telephone Encounter (Signed)
We probably cannot call in this narcotic over the phone so she can come and pick up a prescription we will give her 30 tablets only to take one every 6 hours as needed with no refills

## 2013-08-09 NOTE — Telephone Encounter (Signed)
Appointment on 08/13/13 at [facility]

## 2013-08-09 NOTE — Telephone Encounter (Signed)
Pt informed note, rx will be signed. Pt will pick up

## 2013-08-12 ENCOUNTER — Ambulatory Visit (HOSPITAL_COMMUNITY)
Admission: RE | Admit: 2013-08-12 | Discharge: 2013-08-12 | Disposition: A | Discharge status: Home / Self Care | Payer: BC Managed Care – PPO | Source: Ambulatory Visit | Attending: Gynecology | Admitting: Gynecology

## 2013-08-12 DIAGNOSIS — F17200 Nicotine dependence, unspecified, uncomplicated: Secondary | ICD-10-CM | POA: Insufficient documentation

## 2013-08-16 ENCOUNTER — Other Ambulatory Visit: Payer: Self-pay | Admitting: *Deleted

## 2013-08-16 DIAGNOSIS — N644 Mastodynia: Secondary | ICD-10-CM

## 2013-09-02 ENCOUNTER — Ambulatory Visit (INDEPENDENT_AMBULATORY_CARE_PROVIDER_SITE_OTHER): Payer: BC Managed Care – PPO | Admitting: Gynecology

## 2013-09-02 ENCOUNTER — Other Ambulatory Visit (HOSPITAL_COMMUNITY)
Admission: RE | Admit: 2013-09-02 | Discharge: 2013-09-02 | Disposition: A | Discharge status: Home / Self Care | Payer: BC Managed Care – PPO | Source: Ambulatory Visit | Attending: Gynecology | Admitting: Gynecology

## 2013-09-02 ENCOUNTER — Encounter: Payer: Self-pay | Admitting: Gynecology

## 2013-09-02 VITALS — BP 130/80 | Ht 64.0 in | Wt 165.0 lb

## 2013-09-02 DIAGNOSIS — Z780 Asymptomatic menopausal state: Secondary | ICD-10-CM

## 2013-09-02 DIAGNOSIS — Z124 Encounter for screening for malignant neoplasm of cervix: Secondary | ICD-10-CM | POA: Insufficient documentation

## 2013-09-02 DIAGNOSIS — Z01419 Encounter for gynecological examination (general) (routine) without abnormal findings: Secondary | ICD-10-CM

## 2013-09-02 DIAGNOSIS — F17200 Nicotine dependence, unspecified, uncomplicated: Secondary | ICD-10-CM

## 2013-09-02 DIAGNOSIS — N951 Menopausal and female climacteric states: Secondary | ICD-10-CM

## 2013-09-02 DIAGNOSIS — Z1159 Encounter for screening for other viral diseases: Secondary | ICD-10-CM

## 2013-09-02 DIAGNOSIS — R87810 Cervical high risk human papillomavirus (HPV) DNA test positive: Secondary | ICD-10-CM | POA: Insufficient documentation

## 2013-09-02 DIAGNOSIS — Z1151 Encounter for screening for human papillomavirus (HPV): Secondary | ICD-10-CM | POA: Insufficient documentation

## 2013-09-02 NOTE — Patient Instructions (Addendum)
Smoking Cessation Quitting smoking is important to your health and has many advantages. However, it is not always easy to quit since nicotine is a very addictive drug. Often times, people try 3 times or more before being able to quit. This document explains the best ways for you to prepare to quit smoking. Quitting takes hard work and a lot of effort, but you can do it. ADVANTAGES OF QUITTING SMOKING  You will live longer, feel better, and live better.  Your body will feel the impact of quitting smoking almost immediately.  Within 20 minutes, blood pressure decreases. Your pulse returns to its normal level.  After 8 hours, carbon monoxide levels in the blood return to normal. Your oxygen level increases.  After 24 hours, the chance of having a heart attack starts to decrease. Your breath, hair, and body stop smelling like smoke.  After 48 hours, damaged nerve endings begin to recover. Your sense of taste and smell improve.  After 72 hours, the body is virtually free of nicotine. Your bronchial tubes relax and breathing becomes easier.  After 2 to 12 weeks, lungs can hold more air. Exercise becomes easier and circulation improves.  The risk of having a heart attack, stroke, cancer, or lung disease is greatly reduced.  After 1 year, the risk of coronary heart disease is cut in half.  After 5 years, the risk of stroke falls to the same as a nonsmoker.  After 10 years, the risk of lung cancer is cut in half and the risk of other cancers decreases significantly.  After 15 years, the risk of coronary heart disease drops, usually to the level of a nonsmoker.  If you are pregnant, quitting smoking will improve your chances of having a healthy baby.  The people you live with, especially any children, will be healthier.  You will have extra money to spend on things other than cigarettes. QUESTIONS TO THINK ABOUT BEFORE ATTEMPTING TO QUIT You may want to talk about your answers with your  caregiver.  Why do you want to quit?  If you tried to quit in the past, what helped and what did not?  What will be the most difficult situations for you after you quit? How will you plan to handle them?  Who can help you through the tough times? Your family? Friends? A caregiver?  What pleasures do you get from smoking? What ways can you still get pleasure if you quit? Here are some questions to ask your caregiver:  How can you help me to be successful at quitting?  What medicine do you think would be best for me and how should I take it?  What should I do if I need more help?  What is smoking withdrawal like? How can I get information on withdrawal? GET READY  Set a quit date.  Change your environment by getting rid of all cigarettes, ashtrays, matches, and lighters in your home, car, or work. Do not let people smoke in your home.  Review your past attempts to quit. Think about what worked and what did not. GET SUPPORT AND ENCOURAGEMENT You have a better chance of being successful if you have help. You can get support in many ways.  Tell your family, friends, and co-workers that you are going to quit and need their support. Ask them not to smoke around you.  Get individual, group, or telephone counseling and support. Programs are available at local hospitals and health centers. Call your local health department for   information about programs in your area.  Spiritual beliefs and practices may help some smokers quit.  Download a "quit meter" on your computer to keep track of quit statistics, such as how long you have gone without smoking, cigarettes not smoked, and money saved.  Get a self-help book about quitting smoking and staying off of tobacco. [place] yourself from urges to smoke. Talk to someone, go for a walk, or occupy your time with a task.  Change your normal routine. Take a different route to work. Drink tea instead of coffee.  Eat breakfast in a different place.  Reduce your stress. Take a hot bath, exercise, or read a book.  Plan something enjoyable to do every day. Reward yourself for not smoking.  Explore interactive web-based programs that specialize in helping you quit. GET MEDICINE AND USE IT CORRECTLY Medicines can help you stop smoking and decrease the urge to smoke. Combining medicine with the above behavioral methods and support can greatly increase your chances of successfully quitting smoking.  Nicotine replacement therapy helps deliver nicotine to your body without the negative effects and risks of smoking. Nicotine replacement therapy includes nicotine gum, lozenges, inhalers, nasal sprays, and skin patches. Some may be available over-the-counter and others require a prescription.  Antidepressant medicine helps people abstain from smoking, but how this works is unknown. This medicine is available by prescription.  Nicotinic receptor partial agonist medicine simulates the effect of nicotine in your brain. This medicine is available by prescription. Ask your caregiver for advice about which medicines to use and how to use them based on your health history. Your caregiver will tell you what side effects to look out for if you choose to be on a medicine or therapy. Carefully read the information on the package. Do not use any other product containing nicotine while using a nicotine replacement product.  RELAPSE OR DIFFICULT SITUATIONS Most relapses occur within the first 3 months after quitting. Do not be discouraged if you start smoking again. Remember, most people try several times before finally quitting. You may have symptoms of withdrawal because your body is used to nicotine. You may crave cigarettes, be irritable, feel very hungry, cough often, get headaches, or have difficulty concentrating. The withdrawal symptoms are only temporary. They are strongest when you first quit, but they will go away within  10 14 days. To reduce the chances of relapse, try to:  Avoid drinking alcohol. Drinking lowers your chances of successfully quitting.  Reduce the amount of caffeine you consume. Once you quit smoking, the amount of caffeine in your body increases and can give you symptoms, such as a rapid heartbeat, sweating, and anxiety.  Avoid smokers because they can make you want to smoke.  Do not let weight gain distract you. Many smokers will gain weight when they quit, usually less than 10 pounds. Eat a healthy diet and stay active. You can always lose the weight gained after you quit.  Find ways to improve your mood other than smoking. FOR MORE INFORMATION  www.smokefree.gov  Document Released: 03/15/2001 Document Revised: 09/20/2011 Document Reviewed: 06/30/2011 [facility] Patient Information 2014 [place], [place]. Bone Densitometry Bone densitometry is a special X-ray that measures your bone density and can be used to help predict your risk of bone fractures. This test is used to determine bone mineral content and density to diagnose osteoporosis. Osteoporosis is the loss of bone that may cause the bone to become weak. Osteoporosis commonly occurs in women  entering menopause. However, it may be found in men and in people with other diseases. PREPARATION FOR TEST No preparation necessary. WHO SHOULD BE TESTED?  All women older than 39.  Postmenopausal women (50 to 76) with risk factors for osteoporosis.  People with a previous fracture caused by normal activities.  People with a small body frame (less than 127 poundsor a body mass index [BMI] of less than 21).  People who have a parent with a hip fracture or history of osteoporosis.  People who smoke.  People who have rheumatoid arthritis.  Anyone who engages in excessive alcohol use (more than 3 drinks most days).  Women who experience early menopause. WHEN SHOULD YOU BE RETESTED? Current guidelines suggest that you should wait at  least 2 years before doing a bone density test again if your first test was normal.Recent studies indicated that women with normal bone density may be able to wait a few years before needing to repeat a bone density test. You should discuss this with your caregiver.  NORMAL FINDINGS   Normal: less than standard deviation below normal (greater than -1).  Osteopenia: 1 to 2.5 standard deviations below normal (-1 to -2.5).  Osteoporosis: greater than 2.5 standard deviations below normal (less than -2.5). Test results are reported as a "T score" and a "Z score."The T score is a number that compares your bone density with the bone density of healthy, young women.The Z score is a number that compares your bone density with the scores of women who are the same age, gender, and race.  Ranges for normal findings may vary among different laboratories and hospitals. You should always check with your doctor after having lab work or other tests done to discuss the meaning of your test results and whether your values are considered within normal limits. MEANING OF TEST  Your caregiver will go over the test results with you and discuss the importance and meaning of your results, as well as treatment options and the need for additional tests if necessary. OBTAINING THE TEST RESULTS It is your responsibility to obtain your test results. Ask the lab or department performing the test when and how you will get your results. Document Released: 04/12/2004 Document Revised: 06/13/2011 Document Reviewed: 05/05/2010 [facility] Patient Information 2014 [place].

## 2013-09-02 NOTE — Progress Notes (Addendum)
Crystal Duarte July 30, 1960 [id number]   History:    53 y.o.  for annual gyn exam with no complaints today. Patient with past history of transvaginal hysterectomy with bilateral salpingo-oophorectomy several years ago.She has history of severe depression and is currently on Cymbalta and trazodone who has been seeing Dr. Sheralyn Boatman. Patient was long overdue for a colonoscopy the last one in 2001 according to patient was normal. Her mammogram this year was normal. She has not had a bone density study in over 3 years. She states that she is taking her calcium and vitamin D daily. Both mother and father with history of diabetes. Patient denies any prior history of abnormal Pap smears. Patient is a chronic smoker of one pack cigarettes per day. Last year she had a  chest x-ray PA and lateral demonstrated COPD changes.   Past medical history,surgical history, family history and social history were all reviewed and documented in the EPIC chart.  Gynecologic History No LMP recorded. Patient has had a hysterectomy. Contraception: status post hysterectomy Last Pap: 2012. Results were: normal Last mammogram: 2015. Results were: normal  Obstetric History OB History  Gravida Para Term Preterm AB SAB TAB Ectopic Multiple Living  2 2        2     # Outcome Date GA Lbr Len/2nd Weight Sex Delivery Anes PTL Lv  2 PAR           1 PAR                ROS: A ROS was performed and pertinent positives and negatives are included in the history.  GENERAL: No fevers or chills. HEENT: No change in vision, no earache, sore throat or sinus congestion. NECK: No pain or stiffness. CARDIOVASCULAR: No chest pain or pressure. No palpitations. PULMONARY: No shortness of breath, cough or wheeze. GASTROINTESTINAL: No abdominal pain, nausea, vomiting or diarrhea, melena or bright red blood per rectum. GENITOURINARY: No urinary frequency, urgency, hesitancy or dysuria. MUSCULOSKELETAL: No joint or muscle pain, no back pain,  no recent trauma. DERMATOLOGIC: No rash, no itching, no lesions. ENDOCRINE: No polyuria, polydipsia, no heat or cold intolerance. No recent change in weight. HEMATOLOGICAL: No anemia or easy bruising or bleeding. NEUROLOGIC: No headache, seizures, numbness, tingling or weakness. PSYCHIATRIC: No depression, no loss of interest in normal activity or change in sleep pattern.     Exam: chaperone present  BP 130/80  Ht 5\' 4"  (1.626 m)  Wt 165 lb (74.844 kg)  BMI 28.31 kg/m2  Body mass index is 28.31 kg/(m^2).  General appearance : Well developed well nourished female. No acute distress HEENT: Neck supple, trachea midline, no carotid bruits, no thyroidmegaly Lungs: Clear to auscultation, no rhonchi or wheezes, or rib retractions  Heart: Regular rate and rhythm, no murmurs or gallops Breast:Examined in sitting and supine position were symmetrical in appearance, no palpable masses or tenderness,  no skin retraction, no nipple inversion, no nipple discharge, no skin discoloration, no axillary or supraclavicular lymphadenopathy Abdomen: no palpable masses or tenderness, no rebound or guarding Extremities: no edema or skin discoloration or tenderness  Pelvic:  Bartholin, Urethra, Skene Glands: Within normal limits             Vagina: No gross lesions or discharge  Cervix: Absent  Uterus absent  Adnexa  Without masses or tenderness  Anus and perineum  normal   Rectovaginal  normal sphincter tone without palpated masses or tenderness  Hemoccult cards provided     Assessment/Plan:  53 y.o. female for annual exam was counseled once again asked to do detrimental effects of smoking. Patient has done well from the lower extremity edema by taking HCTZ 12.5 mg daily. She is keeping up with her fluids as recommended. The patient was once again given the name of community gastroenterologist to schedule her overdue colonoscopy. Her Pap smear was done today. She was reminded of the importance of  calcium and vitamin D and regular exercise for osteoporosis prevention. We will schedule a bone density study here in the office in the next few weeks. She will also return in a fasting state so that we can do the following labs: Fasting blood sugar, TSH, comprehensive metabolic panel, and lipid profile along with urinalysis. I had discussed with her to help with her vasomotor symptoms a nonhormonal product called Relizen (natural herbal product with no phytoestrogen).  Note: This dictation was prepared with  Dragon/digital dictation along withSmart phrase technology. Any transcriptional errors that result from this process are unintentional.   Terrance Mass MD, 3:19 PM 09/02/2013

## 2013-09-04 LAB — CYTOLOGY - PAP

## 2013-09-06 ENCOUNTER — Ambulatory Visit: Payer: BC Managed Care – PPO

## 2013-09-06 DIAGNOSIS — Z1159 Encounter for screening for other viral diseases: Secondary | ICD-10-CM

## 2013-09-06 DIAGNOSIS — Z01419 Encounter for gynecological examination (general) (routine) without abnormal findings: Secondary | ICD-10-CM

## 2013-09-06 LAB — COMPREHENSIVE METABOLIC PANEL
ALT: 9 U/L (ref 0–35)
AST: 10 U/L (ref 0–37)
Albumin: 3.9 g/dL (ref 3.5–5.2)
Alkaline Phosphatase: 59 U/L (ref 39–117)
BUN: 14 mg/dL (ref 6–23)
CO2: 27 mEq/L (ref 19–32)
Calcium: 9 mg/dL (ref 8.4–10.5)
Chloride: 107 mEq/L (ref 96–112)
Creat: 0.75 mg/dL (ref 0.50–1.10)
Glucose, Bld: 94 mg/dL (ref 70–99)
Potassium: 4.2 mEq/L (ref 3.5–5.3)
Sodium: 142 mEq/L (ref 135–145)
Total Bilirubin: 0.3 mg/dL (ref 0.2–1.2)
Total Protein: 6 g/dL (ref 6.0–8.3)

## 2013-09-06 LAB — LIPID PANEL
Cholesterol: 176 mg/dL (ref 0–200)
HDL: 48 mg/dL (ref >39)
LDL Cholesterol: 103 mg/dL — ABNORMAL HIGH (ref 0–99)
Total CHOL/HDL Ratio: 3.7 Ratio
Triglycerides: 123 mg/dL (ref <150)
VLDL: 25 mg/dL (ref 0–40)

## 2013-09-06 LAB — CBC WITH DIFFERENTIAL/PLATELET
Basophils Absolute: 0 10*3/uL (ref 0.0–0.1)
Basophils Relative: 0 % (ref 0–1)
Eosinophils Absolute: 0.4 10*3/uL (ref 0.0–0.7)
Eosinophils Relative: 5 % (ref 0–5)
HCT: 38.6 % (ref 36.0–46.0)
Hemoglobin: 13 g/dL (ref 12.0–15.0)
Lymphocytes Relative: 39 % (ref 12–46)
Lymphs Abs: 3.5 10*3/uL (ref 0.7–4.0)
MCH: 31.4 pg (ref 26.0–34.0)
MCHC: 33.7 g/dL (ref 30.0–36.0)
MCV: 93.2 fL (ref 78.0–100.0)
Monocytes Absolute: 0.4 10*3/uL (ref 0.1–1.0)
Monocytes Relative: 5 % (ref 3–12)
Neutro Abs: 4.5 10*3/uL (ref 1.7–7.7)
Neutrophils Relative %: 51 % (ref 43–77)
Platelets: 336 10*3/uL (ref 150–400)
RBC: 4.14 MIL/uL (ref 3.87–5.11)
RDW: 13.8 % (ref 11.5–15.5)
WBC: 8.9 10*3/uL (ref 4.0–10.5)

## 2013-09-06 LAB — HEPATITIS C ANTIBODY: HCV Ab: NEGATIVE

## 2013-09-06 LAB — TSH: TSH: 1.941 u[IU]/mL (ref 0.350–4.500)

## 2013-09-07 LAB — URINALYSIS W MICROSCOPIC + REFLEX CULTURE
Bilirubin Urine: NEGATIVE
Casts: NONE SEEN
Crystals: NONE SEEN
Glucose, UA: NEGATIVE mg/dL
Hgb urine dipstick: NEGATIVE
Ketones, ur: NEGATIVE mg/dL
Leukocytes, UA: NEGATIVE
Nitrite: NEGATIVE
Protein, ur: NEGATIVE mg/dL
Specific Gravity, Urine: 1.027 (ref 1.005–1.030)
Urobilinogen, UA: 0.2 mg/dL (ref 0.0–1.0)
pH: 6 (ref 5.0–8.0)

## 2013-09-09 LAB — URINE CULTURE: Colony Count: 15000

## 2013-09-25 ENCOUNTER — Other Ambulatory Visit: Payer: Self-pay | Admitting: Gynecology

## 2013-10-10 ENCOUNTER — Encounter: Payer: Self-pay | Admitting: Gynecology

## 2013-10-10 ENCOUNTER — Ambulatory Visit (INDEPENDENT_AMBULATORY_CARE_PROVIDER_SITE_OTHER): Payer: BC Managed Care – PPO | Admitting: Gynecology

## 2013-10-10 VITALS — BP 124/84

## 2013-10-10 DIAGNOSIS — I739 Peripheral vascular disease, unspecified: Secondary | ICD-10-CM

## 2013-10-10 DIAGNOSIS — N870 Mild cervical dysplasia: Secondary | ICD-10-CM

## 2013-10-10 DIAGNOSIS — L739 Follicular disorder, unspecified: Secondary | ICD-10-CM

## 2013-10-10 DIAGNOSIS — L738 Other specified follicular disorders: Secondary | ICD-10-CM

## 2013-10-10 DIAGNOSIS — L678 Other hair color and hair shaft abnormalities: Secondary | ICD-10-CM

## 2013-10-10 MED ORDER — NONFORMULARY OR COMPOUNDED ITEM: Status: DC

## 2013-10-10 MED ORDER — CEPHALEXIN 500 MG PO CAPS: 500.0000 mg | ORAL_CAPSULE | Freq: Two times a day (BID) | ORAL | Status: DC

## 2013-10-10 NOTE — Patient Instructions (Addendum)
Peripheral Vascular Disease Peripheral Vascular Disease (PVD), also called Peripheral Arterial Disease (PAD), is a circulation problem caused by cholesterol (atherosclerotic plaque) deposits in the arteries. PVD commonly occurs in the lower extremities (legs) but it can occur in other areas of the body, such as your arms. The cholesterol buildup in the arteries reduces blood flow which can cause pain and other serious problems. The presence of PVD can place a person at risk for Coronary Artery Disease (CAD).  CAUSES  Causes of PVD can be many. It is usually associated with more than one risk factor such as:   High Cholesterol.  Smoking.  Diabetes.  Lack of exercise or inactivity.  High blood pressure (hypertension).  Obesity.  Family history. SYMPTOMS   When the lower extremities are affected, patients with PVD may experience:  Leg pain with exertion or physical activity. This is called INTERMITTENT CLAUDICATION. This may present as cramping or numbness with physical activity. The location of the pain is associated with the level of blockage. For example, blockage at the abdominal level (distal abdominal aorta) may result in buttock or hip pain. Lower leg arterial blockage may result in calf pain.  As PVD becomes more severe, pain can develop with less physical activity.  In people with severe PVD, leg pain may occur at rest.  Other PVD signs and symptoms:  Leg numbness or weakness.  Coldness in the affected leg or foot, especially when compared to the other leg.  A change in leg color.  Patients with significant PVD are more prone to ulcers or sores on toes, feet or legs. These may take longer to heal or may reoccur. The ulcers or sores can become infected.  If signs and symptoms of PVD are ignored, gangrene may occur. This can result in the loss of toes or loss of an entire limb.  Not all leg pain is related to PVD. Other medical conditions can cause leg pain such  as:  Blood clots (embolism) or Deep Vein Thrombosis.  Inflammation of the blood vessels (vasculitis).  Spinal stenosis. DIAGNOSIS  Diagnosis of PVD can involve several different types of tests. These can include:  Pulse Volume Recording Method (PVR). This test is simple, painless and does not involve the use of X-rays. PVR involves measuring and comparing the blood pressure in the arms and legs. An ABI (Ankle-Brachial Index) is calculated. The normal ratio of blood pressures is 1. As this number becomes smaller, it indicates more severe disease.  < 0.95 - indicates significant narrowing in one or more leg vessels.  <0.8 - there will usually be pain in the foot, leg or buttock with exercise.  <0.4 - will usually have pain in the legs at rest.  <0.25 - usually indicates limb threatening PVD.  Doppler detection of pulses in the legs. This test is painless and checks to see if you have a pulses in your legs/feet.  A dye or contrast material (a substance that highlights the blood vessels so they show up on x-ray) may be given to help your caregiver better see the arteries for the following tests. The dye is eliminated from your body by the kidney's. Your caregiver may order blood work to check your kidney function and other laboratory values before the following tests are performed:  Magnetic Resonance Angiography (MRA). An MRA is a picture study of the blood vessels and arteries. The MRA machine uses a large magnet to produce images of the blood vessels.  Computed Tomography Angiography (CTA). A CTA   is a specialized x-ray that looks at how the blood flows in your blood vessels. An IV may be inserted into your arm so contrast dye can be injected.  Angiogram. Is a procedure that uses x-rays to look at your blood vessels. This procedure is minimally invasive, meaning a small incision (cut) is made in your groin. A small tube (catheter) is then inserted into the artery of your groin. The catheter  is guided to the blood vessel or artery your caregiver wants to examine. Contrast dye is injected into the catheter. X-rays are then taken of the blood vessel or artery. After the images are obtained, the catheter is taken out. TREATMENT  Treatment of PVD involves many interventions which may include:  Lifestyle changes:  Quitting smoking.  Exercise.  Following a low fat, low cholesterol diet.  Control of diabetes.  Foot care is very important to the PVD patient. Good foot care can help prevent infection.  Medication:  Cholesterol-lowering medicine.  Blood pressure medicine.  Anti-platelet drugs.  Certain medicines may reduce symptoms of Intermittent Claudication.  Interventional/Surgical options:  Angioplasty. An Angioplasty is a procedure that inflates a balloon in the blocked artery. This opens the blocked artery to improve blood flow.  Stent Implant. A wire mesh tube (stent) is placed in the artery. The stent expands and stays in place, allowing the artery to remain open.  Peripheral Bypass Surgery. This is a surgical procedure that reroutes the blood around a blocked artery to help improve blood flow. This type of procedure may be performed if Angioplasty or stent implants are not an option. SEEK IMMEDIATE MEDICAL CARE IF:   You develop pain or numbness in your arms or legs.  Your arm or leg turns cold, becomes blue in color.  You develop redness, warmth, swelling and pain in your arms or legs. MAKE SURE YOU:   Understand these instructions.  Will watch your condition.  Will get help right away if you are not doing well or get worse. Document Released: 04/28/2004 Document Revised: 06/13/2011 Document Reviewed: 03/25/2008 [facility] Patient Information 2015 [place], [place]. This information is not intended to replace advice given to you by your health care provider. Make sure you discuss any questions you have with your health care provider. Folliculitis   Folliculitis is redness, soreness, and swelling (inflammation) of the hair follicles. This condition can occur anywhere on the body. People with weakened immune systems, diabetes, or obesity have a greater risk of getting folliculitis. CAUSES  Bacterial infection. This is the most common cause.  Fungal infection.  Viral infection.  Contact with certain chemicals, especially oils and tars. Long-term folliculitis can result from bacteria that live in the nostrils. The bacteria may trigger multiple outbreaks of folliculitis over time. SYMPTOMS Folliculitis most commonly occurs on the scalp, thighs, legs, back, buttocks, and areas where hair is shaved frequently. An early sign of folliculitis is a small, white or yellow, pus-filled, itchy lesion (pustule). These lesions appear on a red, inflamed follicle. They are usually less than 0.2 inches (5 mm) wide. When there is an infection of the follicle that goes deeper, it becomes a boil or furuncle. A group of closely packed boils creates a larger lesion (carbuncle). Carbuncles tend to occur in hairy, sweaty areas of the body. DIAGNOSIS  Your caregiver can usually tell what is wrong by doing a physical exam. A sample may be taken from one of the lesions and tested in a lab. This can help determine what is causing your folliculitis.  TREATMENT  Treatment may include:  Applying warm compresses to the affected areas.  Taking antibiotic medicines orally or applying them to the skin.  Draining the lesions if they contain a large amount of pus or fluid.  Laser hair removal for cases of long-lasting folliculitis. This helps to prevent regrowth of the hair. HOME CARE INSTRUCTIONS  Apply warm compresses to the affected areas as directed by your caregiver.  If antibiotics are prescribed, take them as directed. Finish them even if you start to feel better.  You may take over-the-counter medicines to relieve itching.  Do not shave irritated  skin.  Follow up with your caregiver as directed. SEEK IMMEDIATE MEDICAL CARE IF:   You have increasing redness, swelling, or pain in the affected area.  You have a fever. MAKE SURE YOU:  Understand these instructions.  Will watch your condition.  Will get help right away if you are not doing well or get worse. Document Released: 05/30/2001 Document Revised: 09/20/2011 Document Reviewed: 06/21/2011 [facility] Patient Information 2015 [place], [place]. This information is not intended to replace advice given to you by your health care provider. Make sure you discuss any questions you have with your health care provider.

## 2013-10-10 NOTE — Progress Notes (Addendum)
   Patient presented to the office today as a result of her recent Pap smear which demonstrated the following:  LOW GRADE SQUAMOUS INTRAEPITHELIAL LESION: VAIN-1/ HPV (LSIL).  Specimen Clinical Information Hysterectomy Source Vaginal Pap [ThinPrep Imaged] Ancillary Testing HPV 16,18/45 Genotyping NEGATIVE for HPV 16 & 18/45 Completed by [id number] on 2013-09-10 HPV High Risk **DETECTED**  Patient is also complaining of a right medial thigh "bump" but she noted recently. Also patient continues to complain of discomfort on both lower extremities as well as peripheral edema. She is a chronic smoker over 20 years whereby she smokes a pack cigarettes daily.  Patient's recent CBC, comprehensive metabolic panel, fasting lipid profile, TSH, urinalysis were normal. Her hepatitis B screen was negative as well.  Patient underwent a detail colposcopic evaluation external genitalia perineum and perirectal region with no lesions noted the area of concern on her right medial thigh demonstrated evidence of folliculitis. Tender to touch slightly indurated but not erythematous. The speculum was introduced into the vagina no lesions were noted the vaginal cuff was intact patient with prior history of vaginal hysterectomy bilateral salpingo-oophorectomy many years ago. Patient on no hormone replacement therapy.  Extremities: Negative Homans sign. Evidence of peripheral vascular disease slightly tender to touch slightly red and but good pulses. Mild edema was noted. The patient's blood pressure today was 124/84. Patient denies any shortness of breath or any chest palpitations or chest pain.  Assessment/plan: #1 low grade SIL negative high-risk HPV negative colposcopy according to [facility] will followup with Pap smear and cotesting in one year. #2 for vaginal atrophy she'll be started on vaginal estrogen cream to apply twice a week risk benefits and pros and cons discussed #3 she will be referred to the vascular  surgeon for peripheral vascular disease for further evaluation and treatment. We discussed cutting down to her salt intake. We discussed importance of rest with leg elevation and perhaps moist warm compresses. And once again we discussed how important it is her to stop smoking.

## 2013-10-14 ENCOUNTER — Telehealth: Payer: Self-pay | Admitting: *Deleted

## 2013-10-14 NOTE — Telephone Encounter (Signed)
Per [facility] staff message: please make an appointment for this patient with one of the vascular surgeons in reference to this patient's worsening peripheral vascular disease (redness, edema and tenderness of the lower extremities, chronic one pack per day smoker)

## 2013-10-14 NOTE — Telephone Encounter (Signed)
Referral faxed to [facility]

## 2013-10-16 NOTE — Telephone Encounter (Signed)
Appointment 11/12/13 @ 2:15 pm

## 2013-11-11 ENCOUNTER — Encounter: Payer: Self-pay | Admitting: Vascular Surgery

## 2013-11-12 ENCOUNTER — Ambulatory Visit (HOSPITAL_COMMUNITY)
Admission: RE | Admit: 2013-11-12 | Discharge: 2013-11-12 | Disposition: A | Discharge status: Home / Self Care | Payer: BC Managed Care – PPO | Source: Ambulatory Visit | Attending: Vascular Surgery | Admitting: Vascular Surgery

## 2013-11-12 ENCOUNTER — Encounter: Payer: Self-pay | Admitting: Vascular Surgery

## 2013-11-12 ENCOUNTER — Emergency Department (HOSPITAL_BASED_OUTPATIENT_CLINIC_OR_DEPARTMENT_OTHER)
Admission: EM | Admit: 2013-11-12 | Discharge: 2013-11-13 | Disposition: A | Discharge status: Home / Self Care | Payer: BC Managed Care – PPO | Attending: Emergency Medicine | Admitting: Emergency Medicine

## 2013-11-12 ENCOUNTER — Encounter (HOSPITAL_BASED_OUTPATIENT_CLINIC_OR_DEPARTMENT_OTHER): Payer: Self-pay | Admitting: Emergency Medicine

## 2013-11-12 ENCOUNTER — Ambulatory Visit (INDEPENDENT_AMBULATORY_CARE_PROVIDER_SITE_OTHER): Payer: BC Managed Care – PPO | Admitting: Vascular Surgery

## 2013-11-12 VITALS — BP 129/84 | HR 82 | Resp 16 | Ht 65.0 in | Wt 176.0 lb

## 2013-11-12 DIAGNOSIS — Z8619 Personal history of other infectious and parasitic diseases: Secondary | ICD-10-CM | POA: Insufficient documentation

## 2013-11-12 DIAGNOSIS — F329 Major depressive disorder, single episode, unspecified: Secondary | ICD-10-CM | POA: Diagnosis not present

## 2013-11-12 DIAGNOSIS — F411 Generalized anxiety disorder: Secondary | ICD-10-CM | POA: Diagnosis present

## 2013-11-12 DIAGNOSIS — F3289 Other specified depressive episodes: Secondary | ICD-10-CM | POA: Diagnosis not present

## 2013-11-12 DIAGNOSIS — F410 Panic disorder [episodic paroxysmal anxiety] without agoraphobia: Secondary | ICD-10-CM | POA: Diagnosis not present

## 2013-11-12 DIAGNOSIS — R609 Edema, unspecified: Secondary | ICD-10-CM | POA: Insufficient documentation

## 2013-11-12 DIAGNOSIS — M79604 Pain in right leg: Secondary | ICD-10-CM | POA: Insufficient documentation

## 2013-11-12 DIAGNOSIS — R0602 Shortness of breath: Secondary | ICD-10-CM | POA: Insufficient documentation

## 2013-11-12 DIAGNOSIS — M79605 Pain in left leg: Principal | ICD-10-CM

## 2013-11-12 DIAGNOSIS — F17200 Nicotine dependence, unspecified, uncomplicated: Secondary | ICD-10-CM | POA: Diagnosis not present

## 2013-11-12 DIAGNOSIS — Z79899 Other long term (current) drug therapy: Secondary | ICD-10-CM | POA: Insufficient documentation

## 2013-11-12 DIAGNOSIS — R600 Localized edema: Secondary | ICD-10-CM | POA: Insufficient documentation

## 2013-11-12 DIAGNOSIS — M79609 Pain in unspecified limb: Secondary | ICD-10-CM | POA: Insufficient documentation

## 2013-11-12 DIAGNOSIS — I83893 Varicose veins of bilateral lower extremities with other complications: Secondary | ICD-10-CM

## 2013-11-12 HISTORY — DX: Panic disorder (episodic paroxysmal anxiety): F41.0

## 2013-11-12 LAB — CBC WITH DIFFERENTIAL/PLATELET
Basophils Absolute: 0.1 10*3/uL (ref 0.0–0.1)
Basophils Relative: 0 % (ref 0–1)
Eosinophils Absolute: 0.3 10*3/uL (ref 0.0–0.7)
Eosinophils Relative: 2 % (ref 0–5)
HCT: 38.7 % (ref 36.0–46.0)
Hemoglobin: 13.2 g/dL (ref 12.0–15.0)
Lymphocytes Relative: 34 % (ref 12–46)
Lymphs Abs: 5.4 10*3/uL — ABNORMAL HIGH (ref 0.7–4.0)
MCH: 32.3 pg (ref 26.0–34.0)
MCHC: 34.1 g/dL (ref 30.0–36.0)
MCV: 94.6 fL (ref 78.0–100.0)
Monocytes Absolute: 1.1 10*3/uL — ABNORMAL HIGH (ref 0.1–1.0)
Monocytes Relative: 7 % (ref 3–12)
Neutro Abs: 9.1 10*3/uL — ABNORMAL HIGH (ref 1.7–7.7)
Neutrophils Relative %: 57 % (ref 43–77)
Platelets: 310 10*3/uL (ref 150–400)
RBC: 4.09 MIL/uL (ref 3.87–5.11)
RDW: 12.8 % (ref 11.5–15.5)
WBC: 16 10*3/uL — ABNORMAL HIGH (ref 4.0–10.5)

## 2013-11-12 NOTE — Progress Notes (Addendum)
Subjective:     Patient ID: Crystal Duarte, female   DOB: [birth year], 53 y.o.   MRN: [id number]  HPI this 53 year old female was referred for evaluation of edema both legs left worse than right. This patient states that this has been present for several years worse recently particularly on the left side. She has no history of DVT. She did have her left saphenous vein stripped about 20 years ago she thinks. She does not wear elastic compression stockings nor elevate her legs. He has no history of thrombophlebitis, stasis ulcers, bleeding, or other complications. She does not have calf claudication symptoms. The swelling does cause discomfort in her leg are totally in the left calf area. She has a picture on her phone which she  showed me today which does demonstrate significant left ankle and calf edema.  Past Medical History  Diagnosis Date  . Coccidioidomycosis, pulmonary   . Depression     History  Substance Use Topics  . Smoking status: Current Every Day Smoker -- 1.00 packs/day for 30 years    Types: Cigarettes  . Smokeless tobacco: Never Used  . Alcohol Use: No    Family History  Problem Relation Age of Onset  . Asthma Mother   . Ovarian cancer Mother   . Cancer Mother     OVARIAN  . Varicose Veins Mother   . Cancer Maternal Grandmother     LUNG- LUNG  . Heart disease Father   . Peripheral vascular disease Father     Allergies  Allergen Reactions  . Sulfa Antibiotics Rash    Only in sunlight     Current outpatient prescriptions:Cholecalciferol (VITAMIN D3 PO), Take 1,200 mg by mouth daily., Disp: , Rfl: ;  Lurasidone HCl (LATUDA) 20 MG TABS, Take 40 mg by mouth. , Disp: , Rfl: ;  Multiple Vitamin (MULTIVITAMIN) tablet, Take 1 tablet by mouth daily., Disp: , Rfl: ;  Omega-3 Fatty Acids (FISH OIL) 1200 MG CAPS, Take 3 capsules by mouth daily., Disp: , Rfl:  traZODone (DESYREL) 100 MG tablet, Take 100-200 mg by mouth at bedtime. For sleep, Disp: , Rfl: ;  vitamin B-12  (CYANOCOBALAMIN) 1000 MCG tablet, Take 5,000 mcg by mouth daily., Disp: , Rfl: ;  albuterol (PROVENTIL HFA;VENTOLIN HFA) 108 (90 BASE) MCG/ACT inhaler, Inhale 2 puffs into the lungs 2 (two) times daily as needed for wheezing or shortness of breath., Disp: , Rfl:  ALPRAZolam (XANAX) 1 MG tablet, Take 1 mg by mouth 3 (three) times daily., Disp: , Rfl: ;  cephALEXin (KEFLEX) 500 MG capsule, Take 1 capsule (500 mg total) by mouth 2 (two) times daily., Disp: 20 capsule, Rfl: 0;  DULoxetine (CYMBALTA) 60 MG capsule, Take 30 mg by mouth daily. , Disp: , Rfl: ;  folic acid (FOLVITE) 1 MG tablet, Take 1 mg by mouth daily., Disp: , Rfl:  hydrochlorothiazide (MICROZIDE) 12.5 MG capsule, TAKE ONE CAPSULE BY MOUTH EVERY MORNING, Disp: 30 capsule, Rfl: 5;  HYDROcodone-acetaminophen (NORCO/VICODIN) 5-325 MG per tablet, Take 1 tablet by mouth every 6 (six) hours as needed for moderate pain., Disp: 30 tablet, Rfl: 0;  ibuprofen (ADVIL,MOTRIN) 200 MG tablet, Take 800 mg by mouth every 8 (eight) hours as needed for fever or moderate pain. , Disp: , Rfl:  L-Tyrosine 1000 MG TABS, Take 2,000 mg by mouth daily., Disp: , Rfl: ;  naproxen (NAPROSYN) 500 MG tablet, Take one every 8 hrs if needed for pain, Disp: 30 tablet, Rfl: 3;  NONFORMULARY OR COMPOUNDED ITEM, Estradiol 0.2 %  42ml prefilled applicator Sig: apply twice a week, Disp: 24 each, Rfl: 4;  QUETIAPINE FUMARATE PO, Take 60 mg by mouth daily., Disp: , Rfl:   BP 129/84  Pulse 82  Resp 16  Ht 5\' 5"  (1.651 m)  Wt 176 lb (79.833 kg)  BMI 29.29 kg/m2  Body mass index is 29.29 kg/(m^2).             Review of Systems denies chest pain, dyspnea on exertion, PND, orthopnea, hemoptysis, lateralizing weakness, aphasia, amaurosis fugax. All systems negative and complete review of systems     Objective:   Physical Exam BP 129/84  Pulse 82  Resp 16  Ht 5\' 5"  (1.651 m)  Wt 176 lb (79.833 kg)  BMI 29.29 kg/m2  Gen.-alert and oriented x3 in no apparent  distress HEENT normal for age Lungs no rhonchi or wheezing Cardiovascular regular rhythm no murmurs carotid pulses 3+ palpable no bruits audible Abdomen soft nontender no palpable masses Musculoskeletal free of  major deformities Skin clear -no rashes Neurologic normal Lower extremities 3+ femoral and 3+ dorsalis pedis pulses palpable bilaterally with no edema at this time-slight bluish discoloration of both feet left worse than right. Some reticular veins are noted in the left ankle area adjacent to the medial malleolus. No hyperpigmentation or ulceration is noted. No prominent varicosities are noted.  Today I ordered a venous reflux study of the left leg which are reviewed and interpreted.  The left great saphenous vein is absent. There is some reflux and a small segment of the great saphenous vein near the junction. The common femoral vein on the left has some mild reflux. The remainder of the venous study is normal with no DVT or significant deep venous reflux.       Assessment:     Bilateral leg swelling left worse than right due to mild deep venous reflux-status post ligation and stripping left greater saphenous vein    Plan:     Would recommend #1 elevate foot of bed 2-3 inches #2 short leg elastic compression stockings 20-30 mm gradient to be worn daily and be placed on lower extremities immediately at the time of a rising #3 continue diuretics as per medical doctor #4 no further suggestions and no further workup indicated-no evidence of arterial insufficiency

## 2013-11-12 NOTE — ED Provider Notes (Signed)
CSN: [id number]     Arrival date & time 11/12/13  2022 History   None    This chart was scribed for Adenike Shidler Alfonso Patten, MD by Forrestine Him, ED Scribe. This patient was seen in room MH07/MH07 and the patient's care was started 11:31 PM.   Chief Complaint  Patient presents with  . Panic Attack   Patient is a 53 y.o. female presenting with anxiety. The history is provided by the patient.  Anxiety This is a recurrent problem. The current episode started 6 to 12 hours ago. The problem occurs constantly. The problem has not changed since onset.Associated symptoms include shortness of breath. Pertinent negatives include no abdominal pain and no headaches. Nothing aggravates the symptoms. Nothing relieves the symptoms. She has tried nothing for the symptoms. The treatment provided no relief.    HPI Comments: Crystal Duarte is a 53 y.o. female who presents to the Emergency Department complaining of a recurrent panic attack while at rest sitting at home onset 6 PM this evening. She reports spasms, dry mouth, hyperventilation, diaphoresis, and SOB at time of incident. At this time symptoms have now resolved. At time of incident states she tried taking a short walk afterwards with no improvement for symptoms. Pt uses an inhaler when needed and is currently an every day smoker. No recent long distance travel. No estrogen usage at this time. She denies any recent dosage changes to her medications. Pt with known allergies to Sulfa antibiotics. No other concerns this visit.   Past Medical History  Diagnosis Date  . Coccidioidomycosis, pulmonary   . Depression   . Panic attack    Past Surgical History  Procedure Laterality Date  . Lung surgery  2001    VATS surgery   . Vesicovaginal fistula closure w/ tah  2005  . Abdominal hysterectomy     Family History  Problem Relation Age of Onset  . Asthma Mother   . Ovarian cancer Mother   . Cancer Mother     OVARIAN  . Varicose Veins Mother   . Cancer  Maternal Grandmother     LUNG- LUNG  . Heart disease Father   . Peripheral vascular disease Father    History  Substance Use Topics  . Smoking status: Current Every Day Smoker -- 1.00 packs/day for 30 years    Types: Cigarettes  . Smokeless tobacco: Never Used  . Alcohol Use: No   OB History   Grav Para Term Preterm Abortions TAB SAB Ect Mult Living   2 2        2      Review of Systems  Constitutional: Negative for fever and chills.  Respiratory: Positive for shortness of breath. Negative for cough.   Cardiovascular: Negative for palpitations and leg swelling.  Gastrointestinal: Negative for abdominal pain.  Skin: Negative for rash.  Neurological: Negative for weakness and headaches.  Psychiatric/Behavioral: The patient is nervous/anxious.   All other systems reviewed and are negative.     Allergies  Sulfa antibiotics  Home Medications   Prior to Admission medications   Medication Sig Start Date End Date Taking? Authorizing Provider  albuterol (PROVENTIL HFA;VENTOLIN HFA) 108 (90 BASE) MCG/ACT inhaler Inhale 2 puffs into the lungs 2 (two) times daily as needed for wheezing or shortness of breath.    Historical Provider, MD  ALPRAZolam Duanne Moron) 1 MG tablet Take 1 mg by mouth 3 (three) times daily.    Historical Provider, MD  cephALEXin (KEFLEX) 500 MG capsule Take 1 capsule (500  mg total) by mouth 2 (two) times daily. 10/10/13   Terrance Mass, MD  Cholecalciferol (VITAMIN D3 PO) Take 1,200 mg by mouth daily.    Historical Provider, MD  DULoxetine (CYMBALTA) 60 MG capsule Take 30 mg by mouth daily.     Historical Provider, MD  folic acid (FOLVITE) 1 MG tablet Take 1 mg by mouth daily.    Historical Provider, MD  hydrochlorothiazide (MICROZIDE) 12.5 MG capsule TAKE ONE CAPSULE BY MOUTH EVERY MORNING 09/25/13   Terrance Mass, MD  HYDROcodone-acetaminophen (NORCO/VICODIN) 5-325 MG per tablet Take 1 tablet by mouth every 6 (six) hours as needed for moderate pain. 08/09/13   Terrance Mass, MD  ibuprofen (ADVIL,MOTRIN) 200 MG tablet Take 800 mg by mouth every 8 (eight) hours as needed for fever or moderate pain.     Historical Provider, MD  L-Tyrosine 1000 MG TABS Take 2,000 mg by mouth daily.    Historical Provider, MD  Lurasidone HCl (LATUDA) 20 MG TABS Take 40 mg by mouth.     Historical Provider, MD  Multiple Vitamin (MULTIVITAMIN) tablet Take 1 tablet by mouth daily.    Historical Provider, MD  naproxen (NAPROSYN) 500 MG tablet Take one every 8 hrs if needed for pain 08/08/13   Terrance Mass, MD  NONFORMULARY OR COMPOUNDED ITEM Estradiol 0.2 % 63ml prefilled applicator Sig: apply twice a week 10/10/13   Terrance Mass, MD  Omega-3 Fatty Acids (FISH OIL) 1200 MG CAPS Take 3 capsules by mouth daily.    Historical Provider, MD  QUETIAPINE FUMARATE PO Take 60 mg by mouth daily.    Historical Provider, MD  traZODone (DESYREL) 100 MG tablet Take 100-200 mg by mouth at bedtime. For sleep    Historical Provider, MD  vitamin B-12 (CYANOCOBALAMIN) 1000 MCG tablet Take 5,000 mcg by mouth daily.    Historical Provider, MD   Triage Vitals: BP 143/76  Pulse 102  Temp(Src) 98.5 F (36.9 C) (Oral)  Resp 16  Ht 5\' 5"  (1.651 m)  Wt 175 lb (79.379 kg)  BMI 29.12 kg/m2  SpO2 100%   Physical Exam  Nursing note and vitals reviewed. Constitutional: She is oriented to person, place, and time. She appears well-developed and well-nourished. No distress.  HENT:  Head: Normocephalic and atraumatic.  Mouth/Throat: Oropharynx is clear and moist.  Eyes: Conjunctivae and EOM are normal. Pupils are equal, round, and reactive to light.  Neck: Normal range of motion.  Cardiovascular: Normal rate, regular rhythm and normal heart sounds.   Pulmonary/Chest: Effort normal and breath sounds normal. No respiratory distress. She has no wheezes. She has no rales.  Moving good air  Abdominal: Soft. Bowel sounds are normal. She exhibits no distension. There is no tenderness. There is no rebound  and no guarding.  Musculoskeletal: Normal range of motion. She exhibits no edema and no tenderness.  Neurological: She is alert and oriented to person, place, and time. She has normal reflexes. No cranial nerve deficit.  Skin: Skin is warm and dry. She is not diaphoretic.  Psychiatric: Judgment normal. Her mood appears anxious. Her speech is rapid and/or pressured. She is hyperactive.    ED Course  Procedures (including critical care time)  DIAGNOSTIC STUDIES: Oxygen Saturation is 100% on RA, Normal by my interpretation.    COORDINATION OF CARE: 11:44 PM- Will order DG chest 2 view, CBC, BMP, and Troponin I. Discussed treatment plan with pt at bedside and pt agreed to plan.     Labs  Review Labs Reviewed - No data to display  Imaging Review No results found.   EKG Interpretation None      MDM   Final diagnoses:  None   Highly doubt ACS in the setting of ongoing symptoms with negative ekg and troponin ACS is excluded.  PERC negative wells 0 highly doubt PE.  Sx consistent with panic.  RX for ativan.  Follow up with your provider for ongoing care  Date: 11/13/2013  Rate: 80  Rhythm: normal sinus rhythm  QRS Axis: normal  Intervals: normal  ST/T Wave abnormalities: normal  Conduction Disutrbances: none  Narrative Interpretation: unremarkable      I personally performed the services described in this documentation, which was scribed in my presence. The recorded information has been reviewed and is accurate.    Carlisle Beers, MD 11/13/13 [phone]

## 2013-11-12 NOTE — ED Notes (Signed)
C/o "painc attack" started approx 6pm with pacing and SOB-pt NAD at this time

## 2013-11-13 ENCOUNTER — Emergency Department (HOSPITAL_BASED_OUTPATIENT_CLINIC_OR_DEPARTMENT_OTHER): Payer: BC Managed Care – PPO

## 2013-11-13 DIAGNOSIS — F410 Panic disorder [episodic paroxysmal anxiety] without agoraphobia: Secondary | ICD-10-CM | POA: Diagnosis not present

## 2013-11-13 DIAGNOSIS — F17200 Nicotine dependence, unspecified, uncomplicated: Secondary | ICD-10-CM | POA: Diagnosis not present

## 2013-11-13 DIAGNOSIS — R0602 Shortness of breath: Secondary | ICD-10-CM | POA: Diagnosis not present

## 2013-11-13 DIAGNOSIS — F411 Generalized anxiety disorder: Secondary | ICD-10-CM | POA: Diagnosis not present

## 2013-11-13 LAB — BASIC METABOLIC PANEL
Anion gap: 16 — ABNORMAL HIGH (ref 5–15)
BUN: 21 mg/dL (ref 6–23)
CO2: 25 mEq/L (ref 19–32)
Calcium: 10.3 mg/dL (ref 8.4–10.5)
Chloride: 99 mEq/L (ref 96–112)
Creatinine, Ser: 0.9 mg/dL (ref 0.50–1.10)
GFR calc Af Amer: 83 mL/min — ABNORMAL LOW (ref >90)
GFR calc non Af Amer: 71 mL/min — ABNORMAL LOW (ref >90)
Glucose, Bld: 122 mg/dL — ABNORMAL HIGH (ref 70–99)
Potassium: 3.7 mEq/L (ref 3.7–5.3)
Sodium: 140 mEq/L (ref 137–147)

## 2013-11-13 LAB — TROPONIN I: Troponin I: <0.3 ng/mL (ref <0.30)

## 2013-11-20 ENCOUNTER — Ambulatory Visit: Payer: BC Managed Care – PPO | Attending: Family Medicine | Admitting: Rehabilitation

## 2013-11-20 DIAGNOSIS — IMO0001 Reserved for inherently not codable concepts without codable children: Secondary | ICD-10-CM | POA: Diagnosis present

## 2013-11-20 DIAGNOSIS — M542 Cervicalgia: Secondary | ICD-10-CM | POA: Diagnosis not present

## 2013-11-25 ENCOUNTER — Ambulatory Visit: Payer: BC Managed Care – PPO | Admitting: Rehabilitation

## 2013-11-26 ENCOUNTER — Ambulatory Visit: Payer: BC Managed Care – PPO | Admitting: Rehabilitation

## 2013-11-26 DIAGNOSIS — IMO0001 Reserved for inherently not codable concepts without codable children: Secondary | ICD-10-CM | POA: Diagnosis not present

## 2013-11-28 ENCOUNTER — Ambulatory Visit: Payer: BC Managed Care – PPO | Admitting: Rehabilitation

## 2013-11-28 DIAGNOSIS — IMO0001 Reserved for inherently not codable concepts without codable children: Secondary | ICD-10-CM | POA: Diagnosis not present

## 2013-12-04 ENCOUNTER — Ambulatory Visit: Payer: BC Managed Care – PPO | Attending: Family Medicine | Admitting: Rehabilitation

## 2013-12-04 DIAGNOSIS — M542 Cervicalgia: Secondary | ICD-10-CM | POA: Insufficient documentation

## 2013-12-04 DIAGNOSIS — IMO0001 Reserved for inherently not codable concepts without codable children: Secondary | ICD-10-CM | POA: Insufficient documentation

## 2013-12-05 ENCOUNTER — Ambulatory Visit: Payer: BC Managed Care – PPO | Admitting: Rehabilitation

## 2013-12-05 DIAGNOSIS — IMO0001 Reserved for inherently not codable concepts without codable children: Secondary | ICD-10-CM | POA: Diagnosis not present

## 2013-12-11 ENCOUNTER — Ambulatory Visit: Payer: BC Managed Care – PPO | Admitting: Rehabilitation

## 2013-12-17 ENCOUNTER — Ambulatory Visit: Payer: BC Managed Care – PPO | Admitting: Rehabilitation

## 2013-12-17 DIAGNOSIS — IMO0001 Reserved for inherently not codable concepts without codable children: Secondary | ICD-10-CM | POA: Diagnosis not present

## 2013-12-19 ENCOUNTER — Ambulatory Visit: Payer: BC Managed Care – PPO | Admitting: Physical Therapy

## 2013-12-19 DIAGNOSIS — IMO0001 Reserved for inherently not codable concepts without codable children: Secondary | ICD-10-CM | POA: Diagnosis not present

## 2013-12-25 ENCOUNTER — Telehealth: Payer: Self-pay | Admitting: *Deleted

## 2013-12-25 ENCOUNTER — Ambulatory Visit: Payer: BC Managed Care – PPO | Admitting: Physical Therapy

## 2013-12-25 NOTE — Telephone Encounter (Signed)
Pt called requesting name of PCP office [facility] name and number given.

## 2014-01-30 ENCOUNTER — Ambulatory Visit: Payer: BC Managed Care – PPO | Attending: Family Medicine

## 2014-01-30 DIAGNOSIS — M542 Cervicalgia: Secondary | ICD-10-CM | POA: Insufficient documentation

## 2014-02-03 ENCOUNTER — Encounter (HOSPITAL_BASED_OUTPATIENT_CLINIC_OR_DEPARTMENT_OTHER): Payer: Self-pay | Admitting: Emergency Medicine

## 2014-02-12 ENCOUNTER — Ambulatory Visit: Payer: BC Managed Care – PPO | Attending: Family Medicine | Admitting: Rehabilitation

## 2014-02-12 DIAGNOSIS — M542 Cervicalgia: Secondary | ICD-10-CM | POA: Insufficient documentation

## 2014-02-18 ENCOUNTER — Other Ambulatory Visit (HOSPITAL_COMMUNITY): Payer: BC Managed Care – PPO | Attending: Psychiatry | Admitting: Psychiatry

## 2014-02-18 DIAGNOSIS — F329 Major depressive disorder, single episode, unspecified: Secondary | ICD-10-CM | POA: Insufficient documentation

## 2014-02-18 DIAGNOSIS — F410 Panic disorder [episodic paroxysmal anxiety] without agoraphobia: Secondary | ICD-10-CM | POA: Insufficient documentation

## 2014-02-18 DIAGNOSIS — F411 Generalized anxiety disorder: Secondary | ICD-10-CM | POA: Diagnosis not present

## 2014-02-18 DIAGNOSIS — G4700 Insomnia, unspecified: Secondary | ICD-10-CM | POA: Diagnosis not present

## 2014-02-18 DIAGNOSIS — F331 Major depressive disorder, recurrent, moderate: Secondary | ICD-10-CM

## 2014-02-18 NOTE — Progress Notes (Signed)
Psychiatric Assessment Adult  Patient Identification:  Crystal Duarte Date of Evaluation:  02/18/2014 Chief Complaint: cannot sleep and worsening depression History of Chief Complaint:  Medications have been changed recently as well as diagnosis, she says.  Before and after the changes she has been depressed and anxious.  Nothing has ever really helped.  At one point ECT was reportedly helpful but now she says it did not help much and made her memory worse so she will never do that again.  Her diagnosis has been changed to Bipolar with the discontinuation of the antidepressant and beginning of olanzapine and lamotrigine.  Still depressed but chief complaint is that she cannot sleep.  She has had these symptoms since she was 53 years old she says.  She was living with her son and his family but for 2 months has a place of her own.  Her mother just came for an extended visit and that seems to be helping.  The on call doctor increased her olanzapine to 20 mg and that has helped sleep last night for the first time.  She worries about not sleeping and that makes it worse.  She plans to switch to a therapist in her psychiatrist's office hoping that will help.  She is not in school this semester and that has made symptoms worse she believes.  She cannot find a job and that is a stress.  HPI Review of Systems Physical Exam  Depressive Symptoms: depressed mood, anhedonia, insomnia, fatigue, difficulty concentrating, anxiety, weight loss, decreased appetite,  (Hypo) Manic Symptoms:   Elevated Mood:  Negative Irritable Mood:  Yes Grandiosity:  Negative Distractibility:  No Labiality of Mood:  No Delusions:  No Hallucinations:  No Impulsivity:  No Sexually Inappropriate Behavior:  No Financial Extravagance:  No Flight of Ideas:  No  Anxiety Symptoms: Excessive Worry:  Yes Panic Symptoms:  No Agoraphobia:  No Obsessive Compulsive: No  Symptoms: None, Specific Phobias:  No Social Anxiety:   No  Psychotic Symptoms:  Hallucinations: No None Delusions:  No Paranoia:  No   Ideas of Reference:  No  PTSD Symptoms: Ever had a traumatic exposure:  No Had a traumatic exposure in the last month:  No Re-experiencing: No None Hypervigilance:  No Hyperarousal: No None Avoidance: No None  Traumatic Brain Injury: Negative   Past Psychiatric History: Diagnosis: Major depression , recurrent. Moderate.  Generalized anxiety disorder.  Rule out bipolar depression  Hospitalizations: 4  Outpatient Care: sees Luiz Blare for therapy and switching to Doroteo Glassman.  Sees [facility] for medication management  Substance Abuse Care: none  Self-Mutilation: none  Suicidal Attempts: 2 previous, none in the past 5 years  Violent Behaviors: none   Past Medical History:   Past Medical History  Diagnosis Date  . Coccidioidomycosis, pulmonary   . Depression   . Panic attack    History of Loss of Consciousness:  none Seizure History:  none Cardiac History:  Negative Allergies:   Allergies  Allergen Reactions  . Sulfa Antibiotics Rash    Only in sunlight    Current Medications:  Current Outpatient Prescriptions  Medication Sig Dispense Refill  . albuterol (PROVENTIL HFA;VENTOLIN HFA) 108 (90 BASE) MCG/ACT inhaler Inhale 2 puffs into the lungs 2 (two) times daily as needed for wheezing or shortness of breath.    . ALPRAZolam (XANAX) 1 MG tablet Take 1 mg by mouth 3 (three) times daily.    . cephALEXin (KEFLEX) 500 MG capsule Take 1 capsule (500 mg total)  by mouth 2 (two) times daily. 20 capsule 0  . Cholecalciferol (VITAMIN D3 PO) Take 1,200 mg by mouth daily.    . DULoxetine (CYMBALTA) 60 MG capsule Take 30 mg by mouth daily.     . folic acid (FOLVITE) 1 MG tablet Take 1 mg by mouth daily.    . hydrochlorothiazide (MICROZIDE) 12.5 MG capsule TAKE ONE CAPSULE BY MOUTH EVERY MORNING 30 capsule 5  . HYDROcodone-acetaminophen (NORCO/VICODIN) 5-325 MG per tablet Take 1 tablet by mouth  every 6 (six) hours as needed for moderate pain. 30 tablet 0  . ibuprofen (ADVIL,MOTRIN) 200 MG tablet Take 800 mg by mouth every 8 (eight) hours as needed for fever or moderate pain.     Marland Kitchen L-Tyrosine 1000 MG TABS Take 2,000 mg by mouth daily.    . Lurasidone HCl (LATUDA) 20 MG TABS Take 40 mg by mouth.     . Multiple Vitamin (MULTIVITAMIN) tablet Take 1 tablet by mouth daily.    . naproxen (NAPROSYN) 500 MG tablet Take one every 8 hrs if needed for pain 30 tablet 3  . NONFORMULARY OR COMPOUNDED ITEM Estradiol 0.2 % 36ml prefilled applicator Sig: apply twice a week 24 each 4  . Omega-3 Fatty Acids (FISH OIL) 1200 MG CAPS Take 3 capsules by mouth daily.    . QUETIAPINE FUMARATE PO Take 60 mg by mouth daily.    . traZODone (DESYREL) 100 MG tablet Take 100-200 mg by mouth at bedtime. For sleep    . vitamin B-12 (CYANOCOBALAMIN) 1000 MCG tablet Take 5,000 mcg by mouth daily.     No current facility-administered medications for this visit.    Previous Psychotropic Medications:  Medication Dose   Lamictal  150 mg daily  lorazepam 0.5 mg tid  olanzapine 20 mg hs               Substance Abuse History in the last 12 months: Substance Age of 1st Use Last Use Amount Specific Type  Nicotine  none        Alcohol  none  na  na  na  Cannabis  na   na  na  na  Opiates  na  na  na  na  Cocaine  na  na  na  na  Medical Consequences of Substance Abuse: none  Legal Consequences of Substance Abuse: had a DUI 4 years ago after a birthday party for herself and refused a breathalyzer test that resulted in the charge.  Has not had a drink since  Family Consequences of Substance Abuse: none  Blackouts:  Negative DT's:  Negative Withdrawal Symptoms:  Negative None  Social History: Current Place of Residence: [place] of Birth: [place] Family Members: single/ divorced Marital Status:  divorced Children: 2  Sons: one  Daughters: one Relationships: with family only Education:   Dentist Problems/Performance: no problems Religious Beliefs/Practices: not addressed but not reported as an issue History of Abuse: none Occupational Experiences; Military History:  None. Legal History: none Hobbies/Interests: none reported  Family History:   Family History  Problem Relation Age of Onset  . Asthma Mother   . Ovarian cancer Mother   . Cancer Mother     OVARIAN  . Varicose Veins Mother   . Cancer Maternal Grandmother     LUNG- LUNG  . Heart disease Father   . Peripheral vascular disease Father     Mental Status Examination/Evaluation: Objective:  Appearance: Fairly Groomed  Eye Contact::  Good  Speech:  Clear and Coherent  Volume:  Normal  Mood:  anxious  Affect:  Congruent  Thought Process:  Coherent and Logical  Orientation:  Full (Time, Place, and Person)  Thought Content:  Negative  Suicidal Thoughts:  No  Homicidal Thoughts:  No  Judgement:  Intact  Insight:  Fair  Psychomotor Activity:  Normal  Akathisia:  Negative  Handed:  Right  AIMS (if indicated):  0  Assets:  Communication Skills Desire for Improvement Financial Resources/Insurance Housing Physical Health Social Support Talents/Skills Transportation Vocational/Educational    Laboratory/X-Ray Psychological Evaluation(s)   none  none   Will increase lorazepam to 0.5 mg bid and one mg hs.  Leave lamotrigine and olanzapine as is.  Consult Dr Caprice Beaver as to whether she wants to try another antidepressant with the mood stabilizers.  Group therapy daily.  Treatment Plan/Recommendations:  Plan of Care: as above  Laboratory:  none  Psychotherapy: group daily  Medications: as above  Routine PRN Medications:  Negative  Consultations: Dr Caprice Beaver  Safety Concerns:  none  Other:      [name], MD 11/17/201511:50 AM

## 2014-02-18 NOTE — Progress Notes (Signed)
    Daily Group Progress Note  Program: IOP  Group Time: 9:00-10:30  Participation Level: Active  Behavioral Response: Appropriate  Type of Therapy:  Group Therapy  Summary of Progress: Pt. Met with psychiatrist and case manager.      Group Time: 10:30-12:00  Participation Level:  Active  Behavioral Response: Appropriate  Type of Therapy: Psycho-education Group  Summary of Progress: Pt. Was alert and attentive, participated in discussion about developing an attitude of acceptance towards anxiety.   Nancie Neas, LPC
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
## 2014-02-19 ENCOUNTER — Other Ambulatory Visit (HOSPITAL_COMMUNITY): Payer: BC Managed Care – PPO | Admitting: Psychiatry

## 2014-02-19 DIAGNOSIS — F329 Major depressive disorder, single episode, unspecified: Secondary | ICD-10-CM | POA: Diagnosis not present

## 2014-02-19 DIAGNOSIS — F331 Major depressive disorder, recurrent, moderate: Secondary | ICD-10-CM

## 2014-02-20 ENCOUNTER — Other Ambulatory Visit (HOSPITAL_COMMUNITY): Payer: BC Managed Care – PPO | Admitting: Psychiatry

## 2014-02-20 DIAGNOSIS — F329 Major depressive disorder, single episode, unspecified: Secondary | ICD-10-CM | POA: Diagnosis not present

## 2014-02-20 DIAGNOSIS — F331 Major depressive disorder, recurrent, moderate: Secondary | ICD-10-CM

## 2014-02-20 NOTE — Progress Notes (Signed)
    Daily Group Progress Note  Program: IOP  Group Time: 9:00-10:30  Participation Level: Active  Behavioral Response: Appropriate  Type of Therapy:  Group Therapy  Summary of Progress: Pt. Reported that she was doing "good". Pt. Reported that she was able to sleep last night and is trying to schedule her medications better throughout the day. Pt. Reported that she cooked a healthy meal and recognizing need for balance in her life that was lacking when she was going to school.      Group Time: 10:30-12:00  Participation Level:  Active  Behavioral Response: Appropriate  Type of Therapy: Psycho-education Group  Summary of Progress: Pt. Participated in group facilitated by the mental health association.   Nancie Neas, LPC

## 2014-02-20 NOTE — Progress Notes (Signed)
    Daily Group Progress Note  Program: IOP  Group Time: 9:00-10:30  Participation Level: Active  Behavioral Response: Appropriate  Type of Therapy:  Group Therapy  Summary of Progress: Pt. Reported that she was "good", feeling "groggy". Pt. Reported need to take leave from school. Pt. Reported feeling socially isolated since leaving school and moving our of her son's home.      Group Time: 10:30-12:00  Participation Level:  Active  Behavioral Response: Appropriate  Type of Therapy: Psycho-education Group  Summary of Progress: Pt. Participated in discussion about breathing and participated in breathing exercises.   Nancie Neas, LPC

## 2014-02-21 ENCOUNTER — Telehealth (HOSPITAL_COMMUNITY): Payer: Self-pay | Admitting: Psychiatry

## 2014-02-21 ENCOUNTER — Other Ambulatory Visit (HOSPITAL_COMMUNITY): Payer: BC Managed Care – PPO | Admitting: Psychiatry

## 2014-02-21 DIAGNOSIS — F329 Major depressive disorder, single episode, unspecified: Secondary | ICD-10-CM | POA: Diagnosis not present

## 2014-02-21 NOTE — Telephone Encounter (Signed)
At 1115, placed call to [facility] ([phone]) spoke to pharmacist Octavia Bruckner).  Called in Zyprexa 5 mg; Take four tabs at hs prn #120, no refills; per request of Dr. Lovena Le.

## 2014-02-24 ENCOUNTER — Other Ambulatory Visit (HOSPITAL_COMMUNITY): Payer: BC Managed Care – PPO | Admitting: Psychiatry

## 2014-02-24 DIAGNOSIS — F331 Major depressive disorder, recurrent, moderate: Secondary | ICD-10-CM

## 2014-02-24 DIAGNOSIS — F329 Major depressive disorder, single episode, unspecified: Secondary | ICD-10-CM | POA: Diagnosis not present

## 2014-02-24 NOTE — Progress Notes (Signed)
    Daily Group Progress Note  Program: IOP  Group Time: 9:00-10:30  Participation Level: Active  Behavioral Response: Appropriate  Type of Therapy:  Group Therapy  Summary of Progress: Pt. Reports that she spent yesterday with her mother. Pt. Reports that she continues to feel "blah" but that she cannot put her finger on why she her mood has been poor. Pt. Discussed holidays that she will likely spend with her mother.     Group Time: 10:30-12:00  Participation Level:  Active  Behavioral Response: Appropriate  Type of Therapy: Psycho-education Group  Summary of Progress: Pt. Participated in discussion about discharge planning, creating routine after group ends. Pt. Participated in self-esteem guided meditation.   Nancie Neas, LPC

## 2014-02-24 NOTE — Progress Notes (Signed)
    Daily Group Progress Note  Program: IOP  Group Time: 9:00-10:30  Participation Level: Active  Behavioral Response: Appropriate  Type of Therapy:  Group Therapy  Summary of Progress: Pt. Reported that her meds have not been helping her sleep, and she got about an hour last night. Pt. Did not appear to feel well, appeared lethargic, but she still participated. Pt. Indicated significant losses in her life, that she has not processed for fear of being overwhelmed by the pain.      Group Time: 10:30-12:00  Participation Level:  Active  Behavioral Response: Appropriate  Type of Therapy: Psycho-education Group  Summary of Progress: Pt. Participated in grief and loss facilitated by Jeanella Craze.   Nancie Neas, LPC

## 2014-02-25 ENCOUNTER — Other Ambulatory Visit (HOSPITAL_COMMUNITY): Payer: BC Managed Care – PPO | Admitting: Psychiatry

## 2014-02-25 DIAGNOSIS — F331 Major depressive disorder, recurrent, moderate: Secondary | ICD-10-CM

## 2014-02-25 DIAGNOSIS — F329 Major depressive disorder, single episode, unspecified: Secondary | ICD-10-CM | POA: Diagnosis not present

## 2014-02-25 NOTE — Progress Notes (Signed)
Patient ID: Crystal Duarte, female   DOB: [birth year], 53 y.o.   MRN: [id number] Ms Hovatter says the combination of olanzapine and benzodiazepine no longer help.  She switched back to her old regimen of 3 mg Lunesta and 100 mg trazodone and that seemed to help.  Says she does not see the olanzapine helping but makes her feel dull and sluggish.  She stopped taking it but has continued the lamotrigine and with the other 2 meds she believes she is doing okay for now.  Got 6 hours of sleep last night.  Says group does seem to be helpful.  Will be with family for Thanksgiving and is looking forward to that.

## 2014-02-25 NOTE — Progress Notes (Signed)
    Daily Group Progress Note  Program: IOP  Group Time: 9:00-10:30  Participation Level: Active  Behavioral Response: Appropriate  Type of Therapy:  Group Therapy  Summary of Progress: Pt. Shared that she slept better last night by taking lunesta and trazadone. Pt. Reported that she feels better since discontinuing zyprexa.  Pt. Discussed history of depression and feeling misunderstood by her family. Pt. Briefly discussed grieving her stepfather over the past year and inviting her mother to stay with her over the past few months to help support her with her depression.      Group Time: 10:30-12:00  Participation Level:  Active  Behavioral Response: Appropriate  Type of Therapy: Psycho-education Group  Summary of Progress: Pt. Participated in heartmath meditation.   Nancie Neas, LPC

## 2014-02-26 ENCOUNTER — Other Ambulatory Visit (HOSPITAL_COMMUNITY): Payer: BC Managed Care – PPO | Admitting: Psychiatry

## 2014-02-26 DIAGNOSIS — F331 Major depressive disorder, recurrent, moderate: Secondary | ICD-10-CM

## 2014-02-26 DIAGNOSIS — F329 Major depressive disorder, single episode, unspecified: Secondary | ICD-10-CM | POA: Diagnosis not present

## 2014-02-26 NOTE — Progress Notes (Signed)
    Daily Group Progress Note  Program: IOP  Group Time: 9:00-10:30  Participation Level: Minimal  Behavioral Response: Appropriate  Type of Therapy:  Group Therapy  Summary of Progress: Pt. Reports that she is doing "good". Pt. Reports that she has been spending time with her mother. Pt. Reports that she had a good night last night and slept well. Pt. Participates very little in group.      Group Time: 10:30-12:00  Participation Level:  Minimal  Behavioral Response: Appropriate  Type of Therapy: Psycho-education Group  Summary of Progress: Pt. Participated in self-compassion reflective reading.   Nancie Neas, LPC

## 2014-02-28 ENCOUNTER — Other Ambulatory Visit (HOSPITAL_COMMUNITY): Payer: BC Managed Care – PPO

## 2014-03-03 ENCOUNTER — Other Ambulatory Visit (HOSPITAL_COMMUNITY): Payer: BC Managed Care – PPO | Admitting: Psychiatry

## 2014-03-03 DIAGNOSIS — F329 Major depressive disorder, single episode, unspecified: Secondary | ICD-10-CM | POA: Diagnosis not present

## 2014-03-03 DIAGNOSIS — F331 Major depressive disorder, recurrent, moderate: Secondary | ICD-10-CM

## 2014-03-03 NOTE — Progress Notes (Signed)
    Daily Group Progress Note  Program: IOP  Group Time: 9:00-10:30  Participation Level: Minimal  Behavioral Response: Appropriate  Type of Therapy:  Group Therapy  Summary of Progress: Pt. Reported that she had a good Thanksgiving with her family. Pt. Reported that she has not been sleeping and did not sleep at all Saturday night or Sunday night. Pt. Reported that she has been feeling anxious and overactive around the house.      Group Time: 10:30-12:00  Participation Level:  None  Behavioral Response: Appropriate  Type of Therapy: Psycho-education Group  Summary of Progress: Pt. Did not attend grief and loss facilitated by Jeanella Craze. Pt. Reported that she did not feel well.  Nancie Neas, LPC

## 2014-03-04 ENCOUNTER — Other Ambulatory Visit (HOSPITAL_COMMUNITY): Payer: BC Managed Care – PPO | Attending: Psychiatry | Admitting: Psychiatry

## 2014-03-04 ENCOUNTER — Encounter (HOSPITAL_COMMUNITY): Payer: Self-pay

## 2014-03-04 DIAGNOSIS — F411 Generalized anxiety disorder: Secondary | ICD-10-CM | POA: Diagnosis not present

## 2014-03-04 DIAGNOSIS — F329 Major depressive disorder, single episode, unspecified: Secondary | ICD-10-CM | POA: Diagnosis present

## 2014-03-04 DIAGNOSIS — F410 Panic disorder [episodic paroxysmal anxiety] without agoraphobia: Secondary | ICD-10-CM | POA: Insufficient documentation

## 2014-03-04 DIAGNOSIS — F331 Major depressive disorder, recurrent, moderate: Secondary | ICD-10-CM

## 2014-03-04 NOTE — Progress Notes (Signed)
  [facility] Discharge Summary  Crystal Duarte [id number]  Admission date: 02/18/2014 Discharge date: 03/04/2014  Reason for admission: increasing depression and trouble sleeping  Chemical Use History: none  Family of Origin Issues: none  Progress in Program Toward Treatment Goals: no remarkable change, trazodone, Zyprexa increase to 20 mg, increase lorazepam to one mg hs all helped temporarily but no real help.  Crystal Duarte was not helpful.  Sleep remains an issue.  Have called in Seroquel 50 mg :take 1/2 to 1 tab hs.                        Progress (rationale): [name] says group was helpful but sleep was the main issue and that was as big an issue when she left as when she entered    [name], MD 03/04/2014
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
## 2014-03-04 NOTE — Patient Instructions (Signed)
Patient completed MH-IOP today.  Will follow up with Dr. Caprice Beaver on 03-17-14 @ 1:45 pm and Dr. Doroteo Glassman on 03-06-14 @ 10:45 a.m.  Encouraged support groups.

## 2014-03-04 NOTE — Progress Notes (Signed)
    Daily Group Progress Note  Program: IOP  Group Time: 9:00-10:30  Participation Level: Minimal  Behavioral Response: Appropriate  Type of Therapy:  Group Therapy  Summary of Progress: Pt. Continues to reports lack of motivation, lethargy, and difficulty sleeping. Pt. Requested discharge for today, expressed interest in medication for anxiety. Pt. Reported readiness to go back to school on Monday and hopeful that medications would be working by [employer] start of school.      Group Time: 10:30-12:00  Participation Level:  Active  Behavioral Response: Appropriate  Type of Therapy: Psycho-education Group  Summary of Progress: Pt. Met with psychiatrist. Pt. Participated in discussion about the effects of chronic stress on mental health.   Nancie Neas, LPC

## 2014-03-05 ENCOUNTER — Other Ambulatory Visit (HOSPITAL_COMMUNITY): Payer: BC Managed Care – PPO

## 2014-03-06 ENCOUNTER — Other Ambulatory Visit (HOSPITAL_COMMUNITY): Payer: BC Managed Care – PPO

## 2014-03-07 ENCOUNTER — Other Ambulatory Visit (HOSPITAL_COMMUNITY): Payer: BC Managed Care – PPO

## 2014-03-10 ENCOUNTER — Other Ambulatory Visit (HOSPITAL_COMMUNITY): Payer: BC Managed Care – PPO

## 2014-03-11 ENCOUNTER — Other Ambulatory Visit (HOSPITAL_COMMUNITY): Payer: BC Managed Care – PPO

## 2014-03-12 ENCOUNTER — Other Ambulatory Visit (HOSPITAL_COMMUNITY): Payer: BC Managed Care – PPO

## 2014-03-12 DIAGNOSIS — F319 Bipolar disorder, unspecified: Secondary | ICD-10-CM | POA: Insufficient documentation

## 2014-03-13 ENCOUNTER — Other Ambulatory Visit (HOSPITAL_COMMUNITY): Payer: BC Managed Care – PPO

## 2014-03-14 ENCOUNTER — Other Ambulatory Visit (HOSPITAL_COMMUNITY): Payer: BC Managed Care – PPO

## 2014-03-16 ENCOUNTER — Other Ambulatory Visit: Payer: Self-pay | Admitting: Gynecology

## 2014-03-17 ENCOUNTER — Other Ambulatory Visit (HOSPITAL_COMMUNITY): Payer: BC Managed Care – PPO

## 2014-03-17 ENCOUNTER — Other Ambulatory Visit: Payer: Self-pay | Admitting: Internal Medicine

## 2014-03-18 ENCOUNTER — Other Ambulatory Visit (HOSPITAL_COMMUNITY): Payer: BC Managed Care – PPO

## 2014-03-19 ENCOUNTER — Other Ambulatory Visit (HOSPITAL_COMMUNITY): Payer: BC Managed Care – PPO

## 2014-03-20 ENCOUNTER — Other Ambulatory Visit (HOSPITAL_COMMUNITY): Payer: BC Managed Care – PPO

## 2014-03-21 ENCOUNTER — Other Ambulatory Visit (HOSPITAL_COMMUNITY): Payer: BC Managed Care – PPO

## 2014-03-24 ENCOUNTER — Other Ambulatory Visit (HOSPITAL_COMMUNITY): Payer: BC Managed Care – PPO

## 2014-03-25 ENCOUNTER — Other Ambulatory Visit (HOSPITAL_COMMUNITY): Payer: BC Managed Care – PPO

## 2014-03-26 ENCOUNTER — Other Ambulatory Visit (HOSPITAL_COMMUNITY): Payer: BC Managed Care – PPO

## 2014-03-27 ENCOUNTER — Other Ambulatory Visit (HOSPITAL_COMMUNITY): Payer: BC Managed Care – PPO

## 2014-03-31 ENCOUNTER — Other Ambulatory Visit (HOSPITAL_COMMUNITY): Payer: BC Managed Care – PPO

## 2014-07-26 NOTE — Discharge Summary (Signed)
PATIENT NAME:  Crystal Duarte, Crystal Duarte MR#:  [id number] DATE OF BIRTH:  [birth year]  DATE OF ADMISSION:  05/14/2013 DATE OF DISCHARGE:  05/24/2013  HOSPITAL COURSE: See dictated history and physical for details of admission. A 54 year old woman with major depression admitted to the hospital, very depressed, some concern about suicidality, anxious, withdrawn. Also medical problems with bronchitis and some COPD.  The patient was judged to be an appropriate candidate for ECT and was requesting ECT treatment. After evaluation and lab tests were performed and the patient gave consent, it was decided that ECT would be an effective and appropriate treatment for her. She was started with right unilateral treatment with first treatment being on February 13. The patient had shortness of breath and an inflammation of her bronchitis after the first treatment, but subsequent treatments were better tolerated. She had a good response to ECT. Only received 3 treatments and then was feeling concerned about more confusion and mental slowing and also felt that her mood was much better. She requested that we halt treatment after 3 treatments. She was educated about the potential for return of depressive symptoms and was encouraged to come back for an evaluation for maintenance ECT. During her hospital stay, she did not engage in any violent or dangerous behavior. Her suicidality improved. She was often withdrawn but became more interactive on the unit. Had good interactions with her family and agreed to a plan to go home to stay with her family and will be following up with Dr. Toy Care in [place].   DISCHARGE MEDICATIONS:  Trazodone 100 mg 2 tablets at night, Cymbalta 60 mg once a day, albuterol/ipratropium inhaler 4 times a day as needed for COPD, albuterol inhaler 2 puffs as needed for shortness of breath, guaifenesin 600 mg twice a day as needed for mucus, docusate 100 mg twice a day, MiraLAX 17 grams once a day, Flonase nasal 2 sprays  each nostril once a day and fish oil oral capsules 2 grams once a day.   MENTAL STATUS EXAM AT DISCHARGE: Casually dressed, neatly groomed woman, looks her stated age, cooperative with the interview. Good eye contact. Normal psychomotor activity. Speech normal rate, tone and volume. Affect euthymic, reactive, appropriate. Mood stated as good. Thoughts lucid. No obvious loosening of associations or delusional thinking. Denies suicidal or homicidal ideation. Alert and oriented x 4. Short and long-term memory grossly intact. Normal fund of knowledge.   LABORATORY STUDIES: Admission labs included a CBC that was entirely normal. Chemistry panel with no significant abnormalities. Urinalysis showed no significant abnormalities. Chest x-ray showed suture material in the right upper lobe, nothing else remarkable.   DISPOSITION: Discharge home with her family. Follow up with Dr. Toy Care  in [place] and with ECT followup.   DIAGNOSIS, PRINCIPAL AND PRIMARY:  AXIS I: Major depression, severe, recurrent.   SECONDARY DIAGNOSIS: AXIS I: No further.  AXIS II: Deferred.  AXIS III: Chronic obstructive pulmonary disease. Acute bronchitis. Chronic nasal congestion, constipation.  AXIS IV: Severe from social stress from her depression and with her family.  AXIS V: Functioning at time of discharge 50.       ____________________________ Gonzella Lex, MD jtc:dmm D: 06/23/2013 22:21:19 ET T: 06/24/2013 09:50:10 ET JOB#: [id number]  cc: Gonzella Lex, MD, <Dictator> Gonzella Lex MD ELECTRONICALLY SIGNED 06/24/2013 12:16

## 2014-07-26 NOTE — Consult Note (Signed)
PATIENT NAME:  Crystal Duarte, Crystal Duarte MR#:  [id number] DATE OF BIRTH:  03/21/1961  DATE OF CONSULTATION:  05/17/2013  REFERRING PHYSICIAN:  Alethia Berthold, MD CONSULTING PHYSICIAN:  Tana Conch. Leslye Peer, MD  PRIMARY CARE PHYSICIAN: None.  REASON FOR CONSULTATION: Hypoxia after ECT, pulse ox in the 80s.   HISTORY OF PRESENT ILLNESS: This is a 54 year old smoker who on Saturday had a Z-Pak. She took an entire course. She had a bronchitis with fever up to 101. Her son's family was sick. She has had a dry cough and some shortness of breath and she has been wheezing. When I saw the patient they took the patient off oxygen. Her pulse ox at rest was anywhere between 92 100%. With the ECT, it was down in the 80s.   PAST MEDICAL HISTORY: Anxiety, depression, and edema.   PAST SURGICAL HISTORY: Hysterectomy, VATS procedure secondary to a virus when she lived in the [place], and she has also had infertility surgeries with laparoscopies.   ALLERGIES: SULFA.   CURRENT MEDICATIONS: Include acetaminophen 650 mg orally q. 4 hours p.r.n. pain, headache, albuterol 2 puffs every 4 hours for shortness of breath, magnesium oxide 30 mL q. 4 hours p.r.n. indigestion and heartburn, Cymbalta 60 mg daily, magnesium hydroxide 30 mL at bedtime, Nicotrol inhaler 1 cartridge inhalation q. 1 hour p.r.n. nicotine craving, trazodone 200 mg at bedtime, albuterol 2 puffs every 6 hours while awake, Flovent 2 puffs q. 12 hours, folic acid 1 mg daily, hydrochlorothiazide 12.5 mg daily, multivitamin 1 tablet daily, Omega-3 fatty acid 2 grams daily.   SOCIAL HISTORY: Smokes less than a pack per day. Social alcohol. No drug use. Works as a Radio broadcast assistant.   FAMILY HISTORY: Mother with diabetes and ovarian cancer. Father with diabetes and heart disease.   REVIEW OF SYSTEMS: CONSTITUTIONAL: Positive for fever. Positive for chills. Positive for sweats. Positive for fatigue.  EYES: Does wear glasses.  EARS, NOSE, MOUTH AND THROAT: Positive for runny  nose. Positive for swollen glands.  CARDIOVASCULAR: No chest pain. No palpitations.  RESPIRATORY: Positive for shortness of breath. Positive for cough. No sputum. No hemoptysis.  GASTROINTESTINAL: No nausea. No vomiting. No abdominal pain. Positive for constipation. No bright red blood per rectum. No melena.  GENITOURINARY: Positive for blood in the urine.  PSYCHIATRIC: Positive for anxiety and depression.  ENDOCRINE: No thyroid problems.  HEMATOLOGIC AND LYMPHATIC: No anemia. No easy bruising or bleeding.  NEUROLOGIC: No fainting or blackouts.  INTEGUMENT: No rash.   PHYSICAL EXAMINATION: VITAL SIGNS: Temperature 98, pulse 88, respirations 18, blood pressure 113/72, pulse ox ranging between 92 and 100% on room air after the ECT. During the ECT, pulse ox down in the 80s. I do not see the official number documented.  GENERAL: No respiratory distress.  EYES: Conjunctivae and lids normal. Pupils equal, round, and reactive to light. Extraocular muscles intact. No nystagmus.  EARS, NOSE, MOUTH AND THROAT: Tympanic membranes: No erythema. Nasal mucosa: No erythema. Throat: No erythema. No exudate seen. Lips and gums: No lesions.  NECK: No JVD. No bruits. No lymphadenopathy. No thyromegaly. No thyroid nodules palpated.  RESPIRATORY: Decreased breath sounds bilaterally. Positive wheeze throughout entire lung field.  CARDIOVASCULAR: S1 and S2 normal. No gallops, rubs, or murmurs heard. Carotid upstroke 2+ bilaterally. No bruits. Dorsalis pedis pulses 2+ bilaterally. No edema of the lower extremity.  ABDOMEN: Soft, nontender. No organosplenomegaly. Normoactive bowel sounds. No masses felt.  LYMPHATIC: No lymph nodes in the neck.  MUSCULOSKELETAL: No clubbing, edema, or cyanosis.  SKIN:  No rashes or lesions seen.  NEUROLOGIC: Cranial nerves II through XII grossly intact. Deep tendon reflexes 2+ bilateral lower extremities.  PSYCHIATRIC: The patient is oriented to person, place, and time.   LABORATORY  AND RADIOLOGICAL DATA: EKG: Normal sinus rhythm, 80 beats per minute.   Chest x-ray: No infiltrate or effusion.   White blood cell count 9.8, H and H 13.4 and 40.4, platelet count 267. Glucose 104, BUN 7, creatinine 0.87, sodium 136, potassium 3.8, chloride 98, CO2 270, calcium 9.1. Liver function tests normal range.   ASSESSMENT AND PLAN: 1.  Asthmatic bronchitis, possibility of COPD exacerbation. Will give a dose of Solu-Medrol 125 mg IV once and then prednisone taper. Since the patient is on psychiatry floor, will not have an IV in for long. Will give nebulizer treatments including DuoNeb and budesonide and will give a stat dose of Levaquin and daily Levaquin. Since the pulse ox is between 92 and 100% on room air, after she wakes up from sedation from ECT, I do not believe the patient will need oxygen. The patient will need oxygen during the ECT treatments. Hopefully by Monday the patient will be moving better air and will not even need the oxygen with the ECT. Can continue treatment on the psychiatry floor.  2.  Relative hypotension. Hold hydrochlorothiazide.  3.  Anxiety and depression. Treatment as per Dr. Weber Cooks. 4.  Tobacco abuse. Smoking cessation counseling done, 3 minutes by me. On Nicotrol inhaler.  TIME SPENT ON CONSULTATION: 50 minutes.  ____________________________ Tana Conch. Leslye Peer, MD rjw:sb D: 05/17/2013 14:43:51 ET T: 05/17/2013 15:38:31 ET JOB#: [id number]  cc: Tana Conch. Leslye Peer, MD, <Dictator> Marisue Brooklyn MD ELECTRONICALLY SIGNED 05/25/2013 14:10

## 2014-07-26 NOTE — H&P (Signed)
PATIENT NAME:  Crystal Duarte, KISHBAUGH MR#:  [id number] DATE OF BIRTH:  1960-05-01  DATE OF ADMISSION:  05/14/2013  IDENTIFYING INFORMATION AND CHIEF COMPLAINT: A 54 year old woman referred under involuntary commitment from [facility]. Chief complaint: "I can't go on this depressed."   HISTORY OF PRESENT ILLNESS: Information obtained from the patient and the chart. The patient was taken to [facility] by her daughter because of worsening depression. The patient had taken an overdose of prescription Xanax. She had vague suicidal thoughts at the time. She says that her depression has been getting worse and worse for months. She feels down and sad all of the time. She has lost any interest in normal activities. She feels tired most of the time. Sleep is erratic despite her medication. Appetite is erratic. She occasionally will hear vague sounds or see things out of the corner of her eye. She says she has been depressed since age 68 but that the current episode has been going on for a couple of years with disabling worsening for several months. This in spite of seeing a psychiatrist and a therapist and being on psychiatric medication. She does not feel like her life is particularly stressful or has made her situation worse. She has difficulty even finding enjoyment in being with her family. She denies that she is abusing substances.   PAST PSYCHIATRIC HISTORY: The patient has had psychiatric hospitalizations and 3 suicide attempts in the past. She has been on multiple medications. In addition to what she is currently taking, she can recall being on Prozac, Zoloft, lithium, Wellbutrin. She is currently taking a combination of Cymbalta, Xanax and trazodone. The patient has had ECT in the past around 1988. She recalls getting a course of about 6 bilateral treatments at a hospital in [place]. She felt that it was the most effective treatment she had ever had for her depression. The patient does not report a  history of manic symptoms.   FAMILY HISTORY: Father possibly with depression but no history of suicide in the family.   SOCIAL HISTORY: The patient is divorced. She had been living in the [place] area or in [place] for years but relocated about 10 months ago to [place] to be near her 2 adult children. She is living with her adult son and his family. The patient is not currently working. She spends most of her time indoors in bed. Has very little activity.   SUBSTANCE ABUSE HISTORY: She says there have been times in the past when she drank too much, but she has not been drinking in the last few years because she is afraid to do so on her medication. Denies any other substance abuse.   MEDICAL HISTORY: The patient denies any history of diabetes, high blood pressure, heart disease, thyroid disease.   CURRENT MEDICATIONS: Duloxetine 60 mg per day, trazodone 150 mg at night, Xanax 0.5 mg 3 to 4 times a day p.r.n., azithromycin recently started and she is on day 3 of a course for bronchitis. She takes multiple vitamin supplements including a prescription strength folic acid normally.   ALLERGIES: SULFA DRUGS.   REVIEW OF SYSTEMS: Positive for depressed mood, low energy, lack of interest in normal activities. Positive for auditory and visual hallucinations. Positive for poor sleep and erratic appetite. Denies specific physical pain. Denies nausea. Denies any cardiac or pulmonary complaints. The rest of the review of systems is negative.   MENTAL STATUS EXAMINATION: Casually dressed, adequately groomed woman who looks her stated  age or older. Cooperative with the interview. Good eye contact. Psychomotor activity a little slow. Speech slow, decreased in amount but easy to understand. Affect flattened. Mood stated as depressed. Thoughts lucid without loosening of associations. Denies auditory or visual hallucinations. Denies any homicidal ideation. Does have ongoing suicidal thoughts vaguely but no  intent of acting on it in the hospital. The patient shows a normal fund of knowledge for recent events. Short term memory is intact 3 out of 3 objects immediately and at 3 minutes. Long-term memory intact. Normal intelligence. Good judgment and insight.   PHYSICAL EXAMINATION:  SKIN: No skin lesions identified.  GENERAL: The patient does not appear to be in acute distress.  HEENT: Pupils equal and reactive. Face symmetric. Normal dentition.  NECK AND BACK: Nontender. No sign of lymph nodes in the neck.  LUNGS: The patient has diffuse rather loud wheezes throughout her lung fields bilaterally. HEART: Cardiac sounds normal, normal rate and rhythm.  ABDOMEN: Nontender, normal bowel sounds.  NEUROLOGIC: Normal strength and reflexes symmetric upper and lower. Cranial nerves symmetric upper and lower.  VITAL SIGNS: Most recent vital signs show a temperature 98.2, pulse 84, respirations 18, blood pressure 127/84.   LABORATORY RESULTS: So far, all we have back is the chemistry profile which shows slightly elevated glucose at 104 but nothing else abnormal. CBC also is back and is entirely normal.   ASSESSMENT: This is a 54 year old woman with severe recurrent major depression, possibly with some psychotic features. She was referred to [place] from [facility] because the patient had a positive history of response to ECT and was interested in pursuing ECT. Her outpatient psychiatrist had recommended this as well because the patient had not responded to multiple medication trials. The patient requires hospitalization because of suicidal ideation. Also has low mood, lethargy, psychotic symptoms.   TREATMENT PLAN: Admit to psychiatry. On suicide precautions. Ongoing suicide evaluation. Continue current medications for depression and anxiety. Medications will be adjusted to taper for benzodiazepine use. We have discussed ECT, and the patient is very agreeable to starting. We can probably get started on Friday.  Labs are already in process. She can talk with the ECT nurse tomorrow or the next day about the paperwork.   DIAGNOSIS, PRINCIPAL AND PRIMARY:  AXIS I: Major depression, severe, recurrent.   SECONDARY DIAGNOSES:  AXIS I: Alcohol dependence, in sustained remission.  AXIS II: Deferred.  AXIS III: Status post benzodiazepine overdose.  AXIS IV: Severe psychosocial stresses.  AXIS V: Functioning at time of evaluation 25.   ____________________________ Gonzella Lex, MD jtc:gb D: 05/14/2013 22:57:52 ET T: 05/14/2013 23:08:26 ET JOB#: [id number]  cc: Gonzella Lex, MD, <Dictator> Gonzella Lex MD ELECTRONICALLY SIGNED 05/15/2013 13:49

## 2014-09-05 ENCOUNTER — Ambulatory Visit (INDEPENDENT_AMBULATORY_CARE_PROVIDER_SITE_OTHER): Payer: BLUE CROSS/BLUE SHIELD | Admitting: Gynecology

## 2014-09-05 ENCOUNTER — Encounter: Payer: Self-pay | Admitting: Gynecology

## 2014-09-05 ENCOUNTER — Other Ambulatory Visit (HOSPITAL_COMMUNITY)
Admission: RE | Admit: 2014-09-05 | Discharge: 2014-09-05 | Disposition: A | Discharge status: Home / Self Care | Payer: BLUE CROSS/BLUE SHIELD | Source: Ambulatory Visit | Attending: Gynecology | Admitting: Gynecology

## 2014-09-05 VITALS — BP 124/80 | Ht 65.0 in | Wt 173.0 lb

## 2014-09-05 DIAGNOSIS — Z01419 Encounter for gynecological examination (general) (routine) without abnormal findings: Secondary | ICD-10-CM | POA: Diagnosis not present

## 2014-09-05 DIAGNOSIS — R87612 Low grade squamous intraepithelial lesion on cytologic smear of cervix (LGSIL): Secondary | ICD-10-CM

## 2014-09-05 DIAGNOSIS — Z1151 Encounter for screening for human papillomavirus (HPV): Secondary | ICD-10-CM | POA: Diagnosis present

## 2014-09-05 DIAGNOSIS — Z780 Asymptomatic menopausal state: Secondary | ICD-10-CM

## 2014-09-05 DIAGNOSIS — Z01411 Encounter for gynecological examination (general) (routine) with abnormal findings: Secondary | ICD-10-CM | POA: Diagnosis not present

## 2014-09-05 NOTE — Patient Instructions (Signed)

## 2014-09-05 NOTE — Progress Notes (Signed)
Crystal Duarte 03-19-1961 [id number]   History:    54 y.o.  for annual gyn exam with no complaints today. Review of her record indicated that last year after her Pap smear the following was noted:  LOW GRADE SQUAMOUS INTRAEPITHELIAL LESION: VAIN-1/ HPV (LSIL).  Specimen Clinical Information Hysterectomy Source Vaginal Pap [ThinPrep Imaged] Ancillary Testing HPV 16,18/45 Genotyping NEGATIVE for HPV 16 & 18/45 Completed by [id number] on 2013-09-10 HPV High Risk **DETECTED**  She underwent a detail colposcopic examination no abnormality was noted. Pap smear with HPV screening to be done today.Patient with past history of transvaginal hysterectomy with bilateral salpingo-oophorectomy several years ago.She has history of severe depression and is currently on Cymbalta and trazodone who has been seeing Dr. Sheralyn Boatman. Patient was long overdue for a colonoscopy the last one in 2001 according to patient was normal. She continues to smoke although she is working on trying to curtail this habit. She has not had a bone density study as of yet. Patient denies any past history of any abnormal Pap smear. In 2014 she had a chest x-ray which demonstrated some COPD changes.  Past medical history,surgical history, family history and social history were all reviewed and documented in the EPIC chart.  Gynecologic History No LMP recorded. Patient has had a hysterectomy. Contraception: status post hysterectomy Last Pap: 2015. Results were: See above Last mammogram: 2015. Results were: normal  Obstetric History OB History  Gravida Para Term Preterm AB SAB TAB Ectopic Multiple Living  2 2        2     # Outcome Date GA Lbr Len/2nd Weight Sex Delivery Anes PTL Lv  2 Para           1 Para                ROS: A ROS was performed and pertinent positives and negatives are included in the history.  GENERAL: No fevers or chills. HEENT: No change in vision, no earache, sore throat or sinus congestion.  NECK: No pain or stiffness. CARDIOVASCULAR: No chest pain or pressure. No palpitations. PULMONARY: No shortness of breath, cough or wheeze. GASTROINTESTINAL: No abdominal pain, nausea, vomiting or diarrhea, melena or bright red blood per rectum. GENITOURINARY: No urinary frequency, urgency, hesitancy or dysuria. MUSCULOSKELETAL: No joint or muscle pain, no back pain, no recent trauma. DERMATOLOGIC: No rash, no itching, no lesions. ENDOCRINE: No polyuria, polydipsia, no heat or cold intolerance. No recent change in weight. HEMATOLOGICAL: No anemia or easy bruising or bleeding. NEUROLOGIC: No headache, seizures, numbness, tingling or weakness. PSYCHIATRIC: No depression, no loss of interest in normal activity or change in sleep pattern.     Exam: chaperone present  BP 124/80 mmHg  Ht 5\' 5"  (1.651 m)  Wt 173 lb (78.472 kg)  BMI 28.79 kg/m2  Body mass index is 28.79 kg/(m^2).  General appearance : Well developed well nourished female. No acute distress HEENT: Eyes: no retinal hemorrhage or exudates,  Neck supple, trachea midline, no carotid bruits, no thyroidmegaly Lungs: Clear to auscultation, no rhonchi or wheezes, or rib retractions  Heart: Regular rate and rhythm, no murmurs or gallops Breast:Examined in sitting and supine position were symmetrical in appearance, no palpable masses or tenderness,  no skin retraction, no nipple inversion, no nipple discharge, no skin discoloration, no axillary or supraclavicular lymphadenopathy Abdomen: no palpable masses or tenderness, no rebound or guarding Extremities: no edema or skin discoloration or tenderness  Pelvic:  Bartholin, Urethra, Skene Glands: Within normal limits  Vagina: No gross lesions or discharge  Cervix: Absent  Uterus  absent  Adnexa  Without masses or tenderness  Anus and perineum  normal   Rectovaginal  normal sphincter tone without palpated masses or tenderness             Hemoccult we'll schedule colonoscopy      Assessment/Plan:  54 y.o. female for annual exam who last year had low grade squamous intraepithelial lesion on Pap smear and negative colposcopy. Pap smear with HPV screening was done today. Patient was counseled the detrimental effects of smoking. Patient will return back to the office some date next week for the following fasting screening blood work. Fasting lipid profile, conference metabolic panel, TSH, CBC, and urinalysis. Patient also to schedule her bone density study. We discussed importance of calcium vitamin D and regular exercise for osteoporosis prevention. Patient will be referred to the gastroenterologist to schedule colonoscopy.   Terrance Mass MD, 4:21 PM 09/05/2014

## 2014-09-09 LAB — CYTOLOGY - PAP

## 2014-09-10 ENCOUNTER — Encounter: Payer: Self-pay | Admitting: Internal Medicine

## 2014-09-10 ENCOUNTER — Other Ambulatory Visit: Payer: Self-pay

## 2014-09-10 DIAGNOSIS — Z1231 Encounter for screening mammogram for malignant neoplasm of breast: Secondary | ICD-10-CM

## 2014-09-20 ENCOUNTER — Other Ambulatory Visit: Payer: Self-pay | Admitting: Gynecology

## 2014-09-22 NOTE — Telephone Encounter (Signed)
I do not understand about the request?

## 2014-09-23 ENCOUNTER — Other Ambulatory Visit: Payer: BLUE CROSS/BLUE SHIELD

## 2014-09-23 ENCOUNTER — Ambulatory Visit (INDEPENDENT_AMBULATORY_CARE_PROVIDER_SITE_OTHER): Payer: BLUE CROSS/BLUE SHIELD

## 2014-09-23 ENCOUNTER — Other Ambulatory Visit: Payer: Self-pay | Admitting: Gynecology

## 2014-09-23 DIAGNOSIS — M8580 Other specified disorders of bone density and structure, unspecified site: Secondary | ICD-10-CM

## 2014-09-23 DIAGNOSIS — Z01419 Encounter for gynecological examination (general) (routine) without abnormal findings: Secondary | ICD-10-CM

## 2014-09-23 DIAGNOSIS — Z13820 Encounter for screening for osteoporosis: Secondary | ICD-10-CM

## 2014-09-23 DIAGNOSIS — Z780 Asymptomatic menopausal state: Secondary | ICD-10-CM

## 2014-09-23 LAB — CBC WITH DIFFERENTIAL/PLATELET
Basophils Absolute: 0 10*3/uL (ref 0.0–0.1)
Basophils Relative: 0 % (ref 0–1)
Eosinophils Absolute: 0.1 10*3/uL (ref 0.0–0.7)
Eosinophils Relative: 1 % (ref 0–5)
HCT: 37.7 % (ref 36.0–46.0)
Hemoglobin: 12.4 g/dL (ref 12.0–15.0)
Lymphocytes Relative: 10 % — ABNORMAL LOW (ref 12–46)
Lymphs Abs: 1.1 10*3/uL (ref 0.7–4.0)
MCH: 30.9 pg (ref 26.0–34.0)
MCHC: 32.9 g/dL (ref 30.0–36.0)
MCV: 94 fL (ref 78.0–100.0)
MPV: 9.6 fL (ref 8.6–12.4)
Monocytes Absolute: 0.6 10*3/uL (ref 0.1–1.0)
Monocytes Relative: 6 % (ref 3–12)
Neutro Abs: 8.8 10*3/uL — ABNORMAL HIGH (ref 1.7–7.7)
Neutrophils Relative %: 83 % — ABNORMAL HIGH (ref 43–77)
Platelets: 255 10*3/uL (ref 150–400)
RBC: 4.01 MIL/uL (ref 3.87–5.11)
RDW: 14.4 % (ref 11.5–15.5)
WBC: 10.6 10*3/uL — ABNORMAL HIGH (ref 4.0–10.5)

## 2014-09-23 LAB — COMPREHENSIVE METABOLIC PANEL
ALT: 20 U/L (ref 0–35)
AST: 20 U/L (ref 0–37)
Albumin: 4.1 g/dL (ref 3.5–5.2)
Alkaline Phosphatase: 73 U/L (ref 39–117)
BUN: 12 mg/dL (ref 6–23)
CO2: 27 mEq/L (ref 19–32)
Calcium: 9.3 mg/dL (ref 8.4–10.5)
Chloride: 103 mEq/L (ref 96–112)
Creat: 0.77 mg/dL (ref 0.50–1.10)
Glucose, Bld: 100 mg/dL — ABNORMAL HIGH (ref 70–99)
Potassium: 4.3 mEq/L (ref 3.5–5.3)
Sodium: 141 mEq/L (ref 135–145)
Total Bilirubin: 0.4 mg/dL (ref 0.2–1.2)
Total Protein: 6.4 g/dL (ref 6.0–8.3)

## 2014-09-23 LAB — LIPID PANEL
Cholesterol: 187 mg/dL (ref 0–200)
HDL: 46 mg/dL (ref >=46)
LDL Cholesterol: 118 mg/dL — ABNORMAL HIGH (ref 0–99)
Total CHOL/HDL Ratio: 4.1 Ratio
Triglycerides: 116 mg/dL (ref <150)
VLDL: 23 mg/dL (ref 0–40)

## 2014-09-23 LAB — TSH: TSH: 1.471 u[IU]/mL (ref 0.350–4.500)

## 2014-09-24 LAB — URINALYSIS W MICROSCOPIC + REFLEX CULTURE
Bilirubin Urine: NEGATIVE
Casts: NONE SEEN
Crystals: NONE SEEN
Glucose, UA: NEGATIVE mg/dL
Hgb urine dipstick: NEGATIVE
Ketones, ur: NEGATIVE mg/dL
Nitrite: NEGATIVE
Protein, ur: NEGATIVE mg/dL
Specific Gravity, Urine: 1.025 (ref 1.005–1.030)
Urobilinogen, UA: 0.2 mg/dL (ref 0.0–1.0)
pH: 7 (ref 5.0–8.0)

## 2014-09-25 LAB — URINE CULTURE: Colony Count: >=100000

## 2014-09-30 ENCOUNTER — Other Ambulatory Visit (INDEPENDENT_AMBULATORY_CARE_PROVIDER_SITE_OTHER): Payer: BLUE CROSS/BLUE SHIELD

## 2014-09-30 ENCOUNTER — Ambulatory Visit (INDEPENDENT_AMBULATORY_CARE_PROVIDER_SITE_OTHER): Payer: BLUE CROSS/BLUE SHIELD | Admitting: Internal Medicine

## 2014-09-30 ENCOUNTER — Encounter: Payer: Self-pay | Admitting: Internal Medicine

## 2014-09-30 VITALS — BP 130/78 | HR 78 | Temp 98.0°F | Resp 16 | Ht 65.0 in | Wt 173.0 lb

## 2014-09-30 DIAGNOSIS — Z720 Tobacco use: Secondary | ICD-10-CM | POA: Diagnosis not present

## 2014-09-30 DIAGNOSIS — R413 Other amnesia: Secondary | ICD-10-CM | POA: Diagnosis not present

## 2014-09-30 DIAGNOSIS — F17200 Nicotine dependence, unspecified, uncomplicated: Secondary | ICD-10-CM

## 2014-09-30 LAB — VITAMIN B12: Vitamin B-12: 1425 pg/mL — ABNORMAL HIGH (ref 211–911)

## 2014-09-30 LAB — FOLATE: Folate: >24.8 ng/mL (ref >5.9)

## 2014-09-30 LAB — HEMOGLOBIN A1C: Hgb A1c MFr Bld: 5.5 % (ref 4.6–6.5)

## 2014-09-30 MED ORDER — FLUTICASONE PROPIONATE 50 MCG/ACT NA SUSP: 2.0000 | Freq: Every day | NASAL | Status: DC

## 2014-09-30 NOTE — Patient Instructions (Addendum)
We will have you go down to the lab so we can check some blood work for the memory.   We have also sent in a nose spray that you should use daily for the next 2-3 weeks to help with the throat and the ears. It is called flonase and you use 2 sprays in each nostril once a day.   Memory Compensation Strategies  1. Use "WARM" strategy.  W= write it down  A= associate it  R= repeat it  M= make a mental note  2.   You can keep a Social worker.  Use a 3-ring notebook with sections for the following: calendar, important names and phone numbers,  medications, doctors' names/phone numbers, lists/reminders, and a section to journal what you did  each day.   3.    Use a calendar to write appointments down.  4.    Write yourself a schedule for the day.  This can be placed on the calendar or in a separate section of the Memory Notebook.  Keeping a  regular schedule can help memory.  5.    Use medication organizer with sections for each day or morning/evening pills.  You may need help loading it  6.    Keep a basket, or pegboard by the door.  Place items that you need to take out with you in the basket or on the pegboard.  You may also want to  include a message board for reminders.  7.    Use sticky notes.  Place sticky notes with reminders in a place where the task is performed.  For example: " turn off the  stove" placed by the stove, "lock the door" placed on the door at eye level, " take your medications" on  the bathroom mirror or by the place where you normally take your medications.  8.    Use alarms/timers.  Use while cooking to remind yourself to check on food or as a reminder to take your medicine, or as a  reminder to make a call, or as a reminder to perform another task, etc.

## 2014-10-04 ENCOUNTER — Encounter: Payer: Self-pay | Admitting: Internal Medicine

## 2014-10-04 DIAGNOSIS — R413 Other amnesia: Secondary | ICD-10-CM | POA: Insufficient documentation

## 2014-10-04 NOTE — Assessment & Plan Note (Signed)
1 PPD smoker and does not feel able to quit. Counseled her on the risks and harms of cigarette smoke and benefits to quitting. She will think about it.

## 2014-10-04 NOTE — Progress Notes (Signed)
   Subjective:    Patient ID: Crystal Duarte, female    DOB: Jun 03, 1960, 54 y.o.   MRN: [id number]  HPI The patient is a 54 YO female who is new today for concerns about memory problems. She is worried that she is forgetting more recent events and where she leaves things. Worse in the last 1-2 years. Not interfering with ADLs. No episodes of getting lost and good memory for remote events and people. She does have concurrent mental health problems and is undergoing treatment currently with complicated regimen that finally is working and she feels she is improving.   PMH, [facility], social history reviewed and updated.   Review of Systems  Constitutional: Negative for fever, activity change, appetite change and fatigue.  HENT: Negative.   Eyes: Negative.   Respiratory: Negative for cough, chest tightness, shortness of breath and wheezing.   Cardiovascular: Negative for chest pain, palpitations and leg swelling.  Gastrointestinal: Negative for abdominal pain, diarrhea, constipation and abdominal distention.  Musculoskeletal: Negative.   Skin: Negative.   Neurological: Negative for dizziness, speech difficulty, weakness, light-headedness and headaches.       Memory change  Psychiatric/Behavioral: Positive for sleep disturbance, dysphoric mood and decreased concentration. Negative for suicidal ideas and self-injury.      Objective:   Physical Exam  Constitutional: She is oriented to person, place, and time. She appears well-developed and well-nourished.  HENT:  Head: Normocephalic and atraumatic.  Eyes: EOM are normal.  Neck: Normal range of motion.  Cardiovascular: Normal rate and regular rhythm.   Pulmonary/Chest: Effort normal. No respiratory distress. She has no wheezes. She has no rales.  Abdominal: Soft. Bowel sounds are normal.  Musculoskeletal: She exhibits no edema.  Neurological: She is alert and oriented to person, place, and time. Coordination normal.  Recall 3/3 object, A and O  times 4, cannot do serial 7s or world backwards, can name object and follow 3 step command, clock okay.   Skin: Skin is warm and dry.  Psychiatric: She has a normal mood and affect.   Filed Vitals:   09/30/14 1430  BP: 130/78  Pulse: 78  Temp: 98 F (36.7 C)  TempSrc: Oral  Resp: 16  Height: 5\' 5"  (1.651 m)  Weight: 173 lb (78.472 kg)  SpO2: 98%      Assessment & Plan:

## 2014-10-04 NOTE — Assessment & Plan Note (Signed)
At this time not conclusive for dementia. Will check for reversible causes today with TSH, B12, folate, other labs. Suspect that her concurrent mental health medications and condition could be causing some of the forgetfulness she is noticing. Not able to do serial 7s (focus okay, she states math skills have never been good). Given strategies for increasing memory and if continues to be a problem can pursue MRI brain if labs normal.

## 2014-10-07 ENCOUNTER — Other Ambulatory Visit: Payer: Self-pay | Admitting: Gynecology

## 2014-10-07 NOTE — Telephone Encounter (Signed)
Per 09/02/13 note "Patient has done well from the lower extremity edema by taking HCTZ 12.5 mg daily.

## 2014-10-08 ENCOUNTER — Ambulatory Visit
Admission: RE | Admit: 2014-10-08 | Discharge: 2014-10-08 | Disposition: A | Discharge status: Home / Self Care | Payer: BLUE CROSS/BLUE SHIELD | Source: Ambulatory Visit

## 2014-10-08 DIAGNOSIS — Z1231 Encounter for screening mammogram for malignant neoplasm of breast: Secondary | ICD-10-CM

## 2014-11-12 ENCOUNTER — Ambulatory Visit (AMBULATORY_SURGERY_CENTER): Payer: Self-pay

## 2014-11-12 VITALS — Ht 65.0 in | Wt 179.0 lb

## 2014-11-12 DIAGNOSIS — Z1211 Encounter for screening for malignant neoplasm of colon: Secondary | ICD-10-CM

## 2014-11-12 NOTE — Progress Notes (Signed)
No egg or soy allergies Not on home 02 No previous anesthesia complications No diet or weight loss meds Per pt prev colonoscopy in [place] ovr 10 yrs ago for rect bldg, unknown md. Pt said normal except for hems.

## 2014-11-13 ENCOUNTER — Encounter: Payer: Self-pay | Admitting: Internal Medicine

## 2014-11-27 ENCOUNTER — Ambulatory Visit (AMBULATORY_SURGERY_CENTER): Payer: BLUE CROSS/BLUE SHIELD | Admitting: Internal Medicine

## 2014-11-27 ENCOUNTER — Encounter: Payer: Self-pay | Admitting: Internal Medicine

## 2014-11-27 VITALS — BP 116/72 | HR 88 | Temp 96.7°F | Resp 24 | Ht 65.0 in | Wt 179.0 lb

## 2014-11-27 DIAGNOSIS — D120 Benign neoplasm of cecum: Secondary | ICD-10-CM

## 2014-11-27 DIAGNOSIS — Z1211 Encounter for screening for malignant neoplasm of colon: Secondary | ICD-10-CM

## 2014-11-27 DIAGNOSIS — D124 Benign neoplasm of descending colon: Secondary | ICD-10-CM

## 2014-11-27 DIAGNOSIS — K635 Polyp of colon: Secondary | ICD-10-CM | POA: Diagnosis not present

## 2014-11-27 MED ORDER — SODIUM CHLORIDE 0.9 % IV SOLN: 500.0000 mL | INTRAVENOUS | Status: DC

## 2014-11-27 NOTE — Patient Instructions (Addendum)
I found and removed 2 tiny polyps. All else ok. I will let you know pathology results and when to have another routine colonoscopy by mail.  I appreciate the opportunity to care for you. Gatha Mayer, MD, FACG  YOU HAD AN ENDOSCOPIC PROCEDURE TODAY AT [facility]:   Refer to the procedure report that was given to you for any specific questions about what was found during the examination.  If the procedure report does not answer your questions, please call your gastroenterologist to clarify.  If you requested that your care partner not be given the details of your procedure findings, then the procedure report has been included in a sealed envelope for you to review at your convenience later.  YOU SHOULD EXPECT: Some feelings of bloating in the abdomen. Passage of more gas than usual.  Walking can help get rid of the air that was put into your GI tract during the procedure and reduce the bloating. If you had a lower endoscopy (such as a colonoscopy or flexible sigmoidoscopy) you may notice spotting of blood in your stool or on the toilet paper. If you underwent a bowel prep for your procedure, you may not have a normal bowel movement for a few days.  Please Note:  You might notice some irritation and congestion in your nose or some drainage.  This is from the oxygen used during your procedure.  There is no need for concern and it should clear up in a day or so.  SYMPTOMS TO REPORT IMMEDIATELY:   Following lower endoscopy (colonoscopy or flexible sigmoidoscopy):  Excessive amounts of blood in the stool  Significant tenderness or worsening of abdominal pains  Swelling of the abdomen that is new, acute  Fever of 100F or higher  For urgent or emergent issues, a gastroenterologist can be reached at any hour by calling [phone].   DIET: Your first meal following the procedure should be a small meal and then it is ok to progress to your normal diet. Heavy or fried  foods are harder to digest and may make you feel nauseous or bloated.  Likewise, meals heavy in dairy and vegetables can increase bloating.  Drink plenty of fluids but you should avoid alcoholic beverages for 24 hours.  ACTIVITY:  You should plan to take it easy for the rest of today and you should NOT DRIVE or use heavy machinery until tomorrow (because of the sedation medicines used during the test).    FOLLOW UP: Our staff will call the number listed on your records the next business day following your procedure to check on you and address any questions or concerns that you may have regarding the information given to you following your procedure. If we do not reach you, we will leave a message.  However, if you are feeling well and you are not experiencing any problems, there is no need to return our call.  We will assume that you have returned to your regular daily activities without incident.  If any biopsies were taken you will be contacted by phone or by letter within the next 1-3 weeks.  Please call us at [phone] if you have not heard about the biopsies in 3 weeks.    SIGNATURES/CONFIDENTIALITY: You and/or your care partner have signed paperwork which will be entered into your electronic medical record.  These signatures attest to the fact that that the information above on your After Visit Summary has been reviewed and is understood.  Full responsibility of the confidentiality of this discharge information lies with you and/or your care-partner.  Please review polyp handout provided.

## 2014-11-27 NOTE — Op Note (Signed)
[place]  [address] [place], [id number]   COLONOSCOPY PROCEDURE REPORT  PATIENT: Crystal Duarte, Crystal Duarte  MR#: [id number] BIRTHDATE: 1960/05/28 , 7  yrs. old GENDER: female ENDOSCOPIST: Gatha Mayer, MD, [facility] PROCEDURE DATE:  11/27/2014 PROCEDURE:   Colonoscopy, screening and Colonoscopy with biopsy First Screening Colonoscopy - Avg.  risk and is 50 yrs.  old or older Yes.  Prior Negative Screening - Now for repeat screening. N/A  History of Adenoma - Now for follow-up colonoscopy & has been > or = to 3 yrs.  N/A  Polyps removed today? Yes ASA CLASS:   Class II INDICATIONS:Screening for colonic neoplasia and Colorectal Neoplasm Risk Assessment for this procedure is average risk. MEDICATIONS: Propofol 240 mg IV and Monitored anesthesia care  DESCRIPTION OF PROCEDURE:   After the risks benefits and alternatives of the procedure were thoroughly explained, informed consent was obtained.  The digital rectal exam revealed no rectal mass and revealed several skin tags.   The [id number] endoscope was introduced through the anus and advanced to the cecum, which was identified by both the appendix and ileocecal valve. No adverse events experienced.   The quality of the prep was good.  (MiraLax was used)  The instrument was then slowly withdrawn as the colon was fully examined. Estimated blood loss is zero unless otherwise noted in this procedure report.      COLON FINDINGS: Two sessile polyps measuring 2 mm in size were found at the cecum and in the descending colon.  Polypectomies were performed with cold forceps.  The resection was complete, the polyp tissue was completely retrieved and sent to histology.   The examination was otherwise normal.   Right colon retroflexion included.  Retroflexed views revealed no abnormalities. The time to cecum = 2.7 Withdrawal time = 8.6   The scope was withdrawn and the procedure completed. COMPLICATIONS: There were no  immediate complications.  ENDOSCOPIC IMPRESSION: 1.   Two sessile polyps were found at the cecum and in the descending colon; polypectomies were performed with cold forceps 2.   The examination was otherwise normal  RECOMMENDATIONS: Timing of repeat colonoscopy will be determined by pathology findings.  eSigned:  Gatha Mayer, MD, [facility] 11/27/2014 11:39 AM   cc: Dr. Vertell Novak and The Patient

## 2014-11-27 NOTE — Progress Notes (Signed)
Report to PACU, RN, vss, BBS= Clear.  

## 2014-11-27 NOTE — Progress Notes (Signed)
Called to room to assist during endoscopic procedure.  Patient ID and intended procedure confirmed with present staff. Received instructions for my participation in the procedure from the performing physician.  

## 2014-11-28 ENCOUNTER — Telehealth: Payer: Self-pay | Admitting: *Deleted

## 2014-11-28 NOTE — Telephone Encounter (Signed)
  Follow up Call-  Call back number 11/27/2014  Post procedure Call Back phone  # [phone]  Permission to leave phone message Yes     Patient questions:  Phone rang and then went dead; phone not working properly.

## 2014-12-04 ENCOUNTER — Encounter: Payer: Self-pay | Admitting: Internal Medicine

## 2014-12-04 DIAGNOSIS — Z86010 Personal history of colonic polyps: Secondary | ICD-10-CM

## 2014-12-04 DIAGNOSIS — Z860101 Personal history of adenomatous and serrated colon polyps: Secondary | ICD-10-CM

## 2014-12-04 HISTORY — DX: Personal history of adenomatous and serrated colon polyps: Z86.0101

## 2014-12-04 HISTORY — DX: Personal history of colonic polyps: Z86.010

## 2014-12-04 NOTE — Progress Notes (Signed)
Quick Note:  1 adenoma and 1 mucosal polytp - repeat colonoscopy 2023 ______

## 2015-04-27 ENCOUNTER — Other Ambulatory Visit: Payer: Self-pay | Admitting: Gynecology

## 2015-04-27 NOTE — Telephone Encounter (Signed)
Find out who has been prescribing  when this antihypertensive to this patient?

## 2015-04-28 NOTE — Telephone Encounter (Signed)
You approved medication on 10/07/14 with 5 refills. On 09/02/13 office visit it states ""Patient has done well from the lower extremity edema by taking HCTZ 12.5 mg daily.

## 2015-09-15 ENCOUNTER — Encounter: Payer: Self-pay | Admitting: Gynecology

## 2015-09-15 ENCOUNTER — Ambulatory Visit (INDEPENDENT_AMBULATORY_CARE_PROVIDER_SITE_OTHER): Payer: Medicare HMO | Admitting: Gynecology

## 2015-09-15 VITALS — BP 110/76 | Ht 64.0 in | Wt 168.2 lb

## 2015-09-15 DIAGNOSIS — Z720 Tobacco use: Secondary | ICD-10-CM

## 2015-09-15 DIAGNOSIS — F17200 Nicotine dependence, unspecified, uncomplicated: Secondary | ICD-10-CM

## 2015-09-15 DIAGNOSIS — Z87411 Personal history of vaginal dysplasia: Secondary | ICD-10-CM

## 2015-09-15 DIAGNOSIS — M8580 Other specified disorders of bone density and structure, unspecified site: Secondary | ICD-10-CM | POA: Diagnosis not present

## 2015-09-15 DIAGNOSIS — Z01419 Encounter for gynecological examination (general) (routine) without abnormal findings: Secondary | ICD-10-CM | POA: Diagnosis not present

## 2015-09-15 DIAGNOSIS — R7989 Other specified abnormal findings of blood chemistry: Secondary | ICD-10-CM | POA: Diagnosis not present

## 2015-09-15 DIAGNOSIS — R748 Abnormal levels of other serum enzymes: Secondary | ICD-10-CM | POA: Diagnosis not present

## 2015-09-15 LAB — CBC WITH DIFFERENTIAL/PLATELET
Basophils Absolute: 0 cells/uL (ref 0–200)
Basophils Relative: 0 %
Eosinophils Absolute: 228 cells/uL (ref 15–500)
Eosinophils Relative: 2 %
HCT: 40.9 % (ref 35.0–45.0)
Hemoglobin: 13.7 g/dL (ref 11.7–15.5)
Lymphocytes Relative: 31 %
Lymphs Abs: 3534 cells/uL (ref 850–3900)
MCH: 31.4 pg (ref 27.0–33.0)
MCHC: 33.5 g/dL (ref 32.0–36.0)
MCV: 93.8 fL (ref 80.0–100.0)
MPV: 9.9 fL (ref 7.5–12.5)
Monocytes Absolute: 342 cells/uL (ref 200–950)
Monocytes Relative: 3 %
Neutro Abs: 7296 cells/uL (ref 1500–7800)
Neutrophils Relative %: 64 %
Platelets: 314 10*3/uL (ref 140–400)
RBC: 4.36 MIL/uL (ref 3.80–5.10)
RDW: 14.4 % (ref 11.0–15.0)
WBC: 11.4 10*3/uL — ABNORMAL HIGH (ref 3.8–10.8)

## 2015-09-15 LAB — LIPID PANEL
Cholesterol: 240 mg/dL — ABNORMAL HIGH (ref 125–200)
HDL: 60 mg/dL (ref >=46)
LDL Cholesterol: 128 mg/dL (ref <130)
Total CHOL/HDL Ratio: 4 Ratio (ref <=5.0)
Triglycerides: 260 mg/dL — ABNORMAL HIGH (ref <150)
VLDL: 52 mg/dL — ABNORMAL HIGH (ref <30)

## 2015-09-15 LAB — COMPREHENSIVE METABOLIC PANEL
ALT: 13 U/L (ref 6–29)
AST: 14 U/L (ref 10–35)
Albumin: 4.3 g/dL (ref 3.6–5.1)
Alkaline Phosphatase: 79 U/L (ref 33–130)
BUN: 9 mg/dL (ref 7–25)
CO2: 27 mmol/L (ref 20–31)
Calcium: 9.7 mg/dL (ref 8.6–10.4)
Chloride: 101 mmol/L (ref 98–110)
Creat: 0.9 mg/dL (ref 0.50–1.05)
Glucose, Bld: 83 mg/dL (ref 65–99)
Potassium: 4 mmol/L (ref 3.5–5.3)
Sodium: 139 mmol/L (ref 135–146)
Total Bilirubin: 0.5 mg/dL (ref 0.2–1.2)
Total Protein: 6.7 g/dL (ref 6.1–8.1)

## 2015-09-15 LAB — VITAMIN B12: Vitamin B-12: >2000 pg/mL — ABNORMAL HIGH (ref 200–1100)

## 2015-09-15 LAB — TSH: TSH: 1.75 mIU/L

## 2015-09-15 NOTE — Patient Instructions (Addendum)
Smoking Cessation, Tips for Success If you are ready to quit smoking, congratulations! You have chosen to help yourself be healthier. Cigarettes bring nicotine, tar, carbon monoxide, and other irritants into your body. Your lungs, heart, and blood vessels will be able to work better without these poisons. There are many different ways to quit smoking. Nicotine gum, nicotine patches, a nicotine inhaler, or nicotine nasal spray can help with physical craving. Hypnosis, support groups, and medicines help break the habit of smoking. WHAT THINGS CAN I DO TO MAKE QUITTING EASIER?  Here are some tips to help you quit for good:  Pick a date when you will quit smoking completely. Tell all of your friends and family about your plan to quit on that date.  Do not try to slowly cut down on the number of cigarettes you are smoking. Pick a quit date and quit smoking completely starting on that day.  Throw away all cigarettes.   Clean and remove all ashtrays from your home, work, and car.  On a card, write down your reasons for quitting. Carry the card with you and read it when you get the urge to smoke.  Cleanse your body of nicotine. Drink enough water and fluids to keep your urine clear or pale yellow. Do this after quitting to flush the nicotine from your body.  Learn to predict your moods. Do not let a bad situation be your excuse to have a cigarette. Some situations in your life might tempt you into wanting a cigarette.  Never have "just one" cigarette. It leads to wanting another and another. Remind yourself of your decision to quit.  Change habits associated with smoking. If you smoked while driving or when feeling stressed, try other activities to replace smoking. Stand up when drinking your coffee. Brush your teeth after eating. Sit in a different chair when you read the paper. Avoid alcohol while trying to quit, and try to drink fewer caffeinated beverages. Alcohol and caffeine may urge you to  smoke.  Avoid foods and drinks that can trigger a desire to smoke, such as sugary or spicy foods and alcohol.  Ask people who smoke not to smoke around you.  Have something planned to do right after eating or having a cup of coffee. For example, plan to take a walk or exercise.  Try a relaxation exercise to calm you down and decrease your stress. Remember, you may be tense and nervous for the first 2 weeks after you quit, but this will pass.  Find new activities to keep your hands busy. Play with a pen, coin, or rubber band. Doodle or draw things on paper.  Brush your teeth right after eating. This will help cut down on the craving for the taste of tobacco after meals. You can also try mouthwash.   Use oral substitutes in place of cigarettes. Try using lemon drops, carrots, cinnamon sticks, or chewing gum. Keep them handy so they are available when you have the urge to smoke.  When you have the urge to smoke, try deep breathing.  Designate your home as a nonsmoking area.  If you are a heavy smoker, ask your health care provider about a prescription for nicotine chewing gum. It can ease your withdrawal from nicotine.  Reward yourself. Set aside the cigarette money you save and buy yourself something nice.  Look for support from others. Join a support group or smoking cessation program. Ask someone at home or at work to help you with your plan   to quit smoking.  Always ask yourself, "Do I need this cigarette or is this just a reflex?" Tell yourself, "Today, I choose not to smoke," or "I do not want to smoke." You are reminding yourself of your decision to quit.  Do not replace cigarette smoking with electronic cigarettes (commonly called e-cigarettes). The safety of e-cigarettes is unknown, and some may contain harmful chemicals.  If you relapse, do not give up! Plan ahead and think about what you will do the next time you get the urge to smoke. HOW WILL I FEEL WHEN I QUIT SMOKING? You  may have symptoms of withdrawal because your body is used to nicotine (the addictive substance in cigarettes). You may crave cigarettes, be irritable, feel very hungry, cough often, get headaches, or have difficulty concentrating. The withdrawal symptoms are only temporary. They are strongest when you first quit but will go away within 10-14 days. When withdrawal symptoms occur, stay in control. Think about your reasons for quitting. Remind yourself that these are signs that your body is healing and getting used to being without cigarettes. Remember that withdrawal symptoms are easier to treat than the major diseases that smoking can cause.  Even after the withdrawal is over, expect periodic urges to smoke. However, these cravings are generally short lived and will go away whether you smoke or not. Do not smoke! WHAT RESOURCES ARE AVAILABLE TO HELP ME QUIT SMOKING? Your health care provider can direct you to community resources or hospitals for support, which may include:  Group support.  Education.  Hypnosis.  Therapy.   This information is not intended to replace advice given to you by your health care provider. Make sure you discuss any questions you have with your health care provider.   Document Released: 12/18/2003 Document Revised: 04/11/2014 Document Reviewed: 09/06/2012 Elsevier Interactive Patient Education 2016 Chacra you can buy at health food store. Place a couple of drops behind each ear 2-3 times a day if needed for hot flashes

## 2015-09-15 NOTE — Progress Notes (Signed)
Crystal Duarte [birth year] [id number]   History:    55 y.o.  for annual gyn exam who continues to smoke half a pack cigarette per day despite her being counseled in the past of the detrimental effects. Review of her record indicated in 2015 her Pap smear demonstrated the following:  LOW GRADE SQUAMOUS INTRAEPITHELIAL LESION: VAIN-1/ HPV (LSIL).  Specimen Clinical Information Hysterectomy Source Vaginal Pap [ThinPrep Imaged] Ancillary Testing HPV 16,18/45 Genotyping NEGATIVE for HPV 16 & 18/45 Completed by [id number] on 2013-09-10 HPV High Risk **DETECTED**  She underwent a detail colposcopic examination no abnormality was noted. Pap smear with HPV screening to be done today.Patient with past history of transvaginal hysterectomy with bilateral salpingo-oophorectomy several years ago.She has history of severe depression and is currently on Cymbalta and trazodone who has been seeing Dr. Sheralyn Boatman.  Her recent colonoscopy in 2016 demonstrated benign colon polyps that she is on a 5 year recall. In 2014 she had a chest x-ray which demonstrated COPD changes. Bone density study done in 2016 lowest T score -1.3 at the right femoral neck normal Frax analysis.  Past medical history,surgical history, family history and social history were all reviewed and documented in the EPIC chart.  Gynecologic History No LMP recorded. Patient has had a hysterectomy. Contraception: post menopausal status Last Pap: 2016. Results were: normal Last mammogram: 2016. Results were: normal  Obstetric History OB History  Gravida Para Term Preterm AB SAB TAB Ectopic Multiple Living  2 2        2     # Outcome Date GA Lbr Len/2nd Weight Sex Delivery Anes PTL Lv  2 Para           1 Para                ROS: A ROS was performed and pertinent positives and negatives are included in the history.  GENERAL: No fevers or chills. HEENT: No change in vision, no earache, sore throat or sinus congestion. NECK: No pain  or stiffness. CARDIOVASCULAR: No chest pain or pressure. No palpitations. PULMONARY: No shortness of breath, cough or wheeze. GASTROINTESTINAL: No abdominal pain, nausea, vomiting or diarrhea, melena or bright red blood per rectum. GENITOURINARY: No urinary frequency, urgency, hesitancy or dysuria. MUSCULOSKELETAL: No joint or muscle pain, no back pain, no recent trauma. DERMATOLOGIC: No rash, no itching, no lesions. ENDOCRINE: No polyuria, polydipsia, no heat or cold intolerance. No recent change in weight. HEMATOLOGICAL: No anemia or easy bruising or bleeding. NEUROLOGIC: No headache, seizures, numbness, tingling or weakness. PSYCHIATRIC: No depression, no loss of interest in normal activity or change in sleep pattern.     Exam: chaperone present  BP 110/76 mmHg  Ht 5\' 4"  (1.626 m)  Wt 168 lb 3.2 oz (76.295 kg)  BMI 28.86 kg/m2  Body mass index is 28.86 kg/(m^2).  General appearance : Well developed well nourished female. No acute distress HEENT: Eyes: no retinal hemorrhage or exudates,  Neck supple, trachea midline, no carotid bruits, no thyroidmegaly Lungs: Clear to auscultation, no rhonchi or wheezes, or rib retractions  Heart: Regular rate and rhythm, no murmurs or gallops Breast:Examined in sitting and supine position were symmetrical in appearance, no palpable masses or tenderness,  no skin retraction, no nipple inversion, no nipple discharge, no skin discoloration, no axillary or supraclavicular lymphadenopathy Abdomen: no palpable masses or tenderness, no rebound or guarding Extremities: no edema or skin discoloration or tenderness  Pelvic:  Bartholin, Urethra, Skene Glands: Within normal limits  Vagina: No gross lesions or discharge  Cervix: Absent  Uterus  absent  Adnexa  Without masses or tenderness  Anus and perineum  normal   Rectovaginal  normal sphincter tone without palpated masses or tenderness             Hemoccult cards provided     Assessment/Plan:   55 y.o. female for annual exam with past history of vaginal dysplasia Pap smear was done today. She was counseled once again the detrimental effects of smoking and smoking cessation information provided. Requisition provided her to schedule her three-dimensional mammogram since is due and also review of past years mammogram indicated she had dense breasts. She was provided with fecal Hemoccult cards to submit to the office for testing. The following blood were: CBC, comprehensive metabolic panel, fasting lipid profile, TSH, and urinalysis. We discussed importance of calcium vitamin D for osteoporosis prevention. Patient with osteopenia and with a history of smoking were going to check her vitamin D level. Also last year her vitamin D 12 level was elevated she was instructed to cut down the dose by half and we'll be checking her levels today as well.   Terrance Mass MD, 3:02 PM 09/15/2015

## 2015-09-16 ENCOUNTER — Other Ambulatory Visit: Payer: Self-pay | Admitting: Gynecology

## 2015-09-16 DIAGNOSIS — Z1231 Encounter for screening mammogram for malignant neoplasm of breast: Secondary | ICD-10-CM

## 2015-09-16 LAB — URINALYSIS W MICROSCOPIC + REFLEX CULTURE
Bacteria, UA: NONE SEEN [HPF]
Bilirubin Urine: NEGATIVE
Casts: NONE SEEN [LPF]
Crystals: NONE SEEN [HPF]
Glucose, UA: NEGATIVE
Hgb urine dipstick: NEGATIVE
Ketones, ur: NEGATIVE
Leukocytes, UA: NEGATIVE
Nitrite: NEGATIVE
Protein, ur: NEGATIVE
RBC / HPF: NONE SEEN RBC/HPF (ref <=2)
Specific Gravity, Urine: 1.008 (ref 1.001–1.035)
WBC, UA: NONE SEEN WBC/HPF (ref <=5)
Yeast: NONE SEEN [HPF]
pH: 7.5 (ref 5.0–8.0)

## 2015-09-16 LAB — PAP IG W/ RFLX HPV ASCU

## 2015-09-16 LAB — VITAMIN D 25 HYDROXY (VIT D DEFICIENCY, FRACTURES): Vit D, 25-Hydroxy: 50 ng/mL (ref 30–100)

## 2015-09-17 ENCOUNTER — Other Ambulatory Visit: Payer: Self-pay | Admitting: Gynecology

## 2015-09-17 ENCOUNTER — Telehealth: Payer: Self-pay

## 2015-09-17 DIAGNOSIS — R748 Abnormal levels of other serum enzymes: Secondary | ICD-10-CM

## 2015-09-17 NOTE — Telephone Encounter (Signed)
Patients states she had some labs done at a different doctor and they wanted you all to aware of some results. Chol. 240 Trid 260 b12 VERY high Would would you like to her do. She is seeing her GYN again in 6weeks to redo labs.

## 2015-09-17 NOTE — Telephone Encounter (Signed)
We would need visit to discuss, can be done at her physical which is due soon. The B12 level is fine. Would repeat lipids fasting after visit.

## 2015-09-18 NOTE — Telephone Encounter (Signed)
Spoke with patient and she is aware and will schedule her physical.

## 2015-09-21 DIAGNOSIS — H04123 Dry eye syndrome of bilateral lacrimal glands: Secondary | ICD-10-CM | POA: Diagnosis not present

## 2015-10-08 ENCOUNTER — Other Ambulatory Visit (INDEPENDENT_AMBULATORY_CARE_PROVIDER_SITE_OTHER): Payer: Medicare HMO

## 2015-10-08 ENCOUNTER — Encounter: Payer: Self-pay | Admitting: Internal Medicine

## 2015-10-08 ENCOUNTER — Ambulatory Visit (INDEPENDENT_AMBULATORY_CARE_PROVIDER_SITE_OTHER): Payer: Medicare HMO | Admitting: Internal Medicine

## 2015-10-08 VITALS — BP 130/70 | HR 100 | Temp 98.7°F | Resp 16 | Ht 65.0 in | Wt 160.0 lb

## 2015-10-08 DIAGNOSIS — J209 Acute bronchitis, unspecified: Secondary | ICD-10-CM

## 2015-10-08 DIAGNOSIS — E781 Pure hyperglyceridemia: Secondary | ICD-10-CM

## 2015-10-08 DIAGNOSIS — F17200 Nicotine dependence, unspecified, uncomplicated: Secondary | ICD-10-CM

## 2015-10-08 DIAGNOSIS — Z0000 Encounter for general adult medical examination without abnormal findings: Secondary | ICD-10-CM

## 2015-10-08 DIAGNOSIS — Z720 Tobacco use: Secondary | ICD-10-CM

## 2015-10-08 DIAGNOSIS — Z23 Encounter for immunization: Secondary | ICD-10-CM

## 2015-10-08 LAB — LIPID PANEL
Cholesterol: 205 mg/dL — ABNORMAL HIGH (ref 0–200)
HDL: 52 mg/dL (ref >39.00)
LDL Cholesterol: 119 mg/dL — ABNORMAL HIGH (ref 0–99)
NonHDL: 152.99
Total CHOL/HDL Ratio: 4
Triglycerides: 168 mg/dL — ABNORMAL HIGH (ref 0.0–149.0)
VLDL: 33.6 mg/dL (ref 0.0–40.0)

## 2015-10-08 MED ORDER — DOXYCYCLINE HYCLATE 100 MG PO TABS: 100.0000 mg | ORAL_TABLET | Freq: Two times a day (BID) | ORAL | Status: DC

## 2015-10-08 MED ORDER — ALBUTEROL SULFATE HFA 108 (90 BASE) MCG/ACT IN AERS: 2.0000 | INHALATION_SPRAY | Freq: Four times a day (QID) | RESPIRATORY_TRACT | Status: DC | PRN

## 2015-10-08 MED ORDER — FLUTICASONE PROPIONATE 50 MCG/ACT NA SUSP: 2.0000 | Freq: Every day | NASAL | Status: DC

## 2015-10-08 MED ORDER — HYDROCHLOROTHIAZIDE 12.5 MG PO CAPS: 12.5000 mg | ORAL_CAPSULE | Freq: Every morning | ORAL | Status: DC

## 2015-10-08 NOTE — Progress Notes (Signed)
Pre visit review using our clinic review tool, if applicable. No additional management support is needed unless otherwise documented below in the visit note. 

## 2015-10-08 NOTE — Progress Notes (Signed)
   Subjective:    Patient ID: Crystal Duarte, female    DOB: June 20, 1960, 55 y.o.   MRN: [id number]  HPI The patient is a 55 YO female coming in for wellness. No new concerns but worried about her cholesterol. Triglycerides high at the gyn several weeks ago.  PMH, [facility], social history reviewed and updated.   Review of Systems  Constitutional: Negative for fever, activity change, appetite change and fatigue.  HENT: Negative.   Eyes: Negative.   Respiratory: Negative for cough, chest tightness, shortness of breath and wheezing.   Cardiovascular: Negative for chest pain, palpitations and leg swelling.  Gastrointestinal: Negative for abdominal pain, diarrhea, constipation and abdominal distention.  Musculoskeletal: Negative.   Skin: Negative.   Neurological: Negative for dizziness, speech difficulty, weakness, light-headedness and headaches.  Psychiatric/Behavioral: Positive for sleep disturbance. Negative for suicidal ideas and self-injury.      Objective:   Physical Exam  Constitutional: She is oriented to person, place, and time. She appears well-developed and well-nourished.  HENT:  Head: Normocephalic and atraumatic.  Eyes: EOM are normal.  Neck: Normal range of motion.  Cardiovascular: Normal rate and regular rhythm.   Pulmonary/Chest: Effort normal. No respiratory distress. She has wheezes. She has no rales.  Some scattered wheezing which partially clears with coughing.   Abdominal: Soft. Bowel sounds are normal. She exhibits no distension. There is no tenderness. There is no rebound.  Musculoskeletal: She exhibits no edema.  Neurological: She is alert and oriented to person, place, and time. Coordination normal.  Skin: Skin is warm and dry.  Psychiatric: She has a normal mood and affect.   Filed Vitals:   10/08/15 1452  BP: 130/70  Pulse: 100  Temp: 98.7 F (37.1 C)  TempSrc: Oral  Resp: 16  Height: 5\' 5"  (1.651 m)  Weight: 160 lb (72.576 kg)  SpO2: 96%        Assessment & Plan:  Tdap given at visit.

## 2015-10-08 NOTE — Patient Instructions (Signed)
We will check the cholesterol again today.   We have sent in doxycycline that you will take 1 pill twice a day for 7 weeks. We have also sent in an inhaler that you can use as needed for shortness of breath or wheezing.   Health Maintenance, Female Adopting a healthy lifestyle and getting preventive care can go a long way to promote health and wellness. Talk with your health care provider about what schedule of regular examinations is right for you. This is a good chance for you to check in with your provider about disease prevention and staying healthy. In between checkups, there are plenty of things you can do on your own. Experts have done a lot of research about which lifestyle changes and preventive measures are most likely to keep you healthy. Ask your health care provider for more information. WEIGHT AND DIET  Eat a healthy diet  Be sure to include plenty of vegetables, fruits, low-fat dairy products, and lean protein.  Do not eat a lot of foods high in solid fats, added sugars, or salt.  Get regular exercise. This is one of the most important things you can do for your health.  Most adults should exercise for at least 150 minutes each week. The exercise should increase your heart rate and make you sweat (moderate-intensity exercise).  Most adults should also do strengthening exercises at least twice a week. This is in addition to the moderate-intensity exercise.  Maintain a healthy weight  Body mass index (BMI) is a measurement that can be used to identify possible weight problems. It estimates body fat based on height and weight. Your health care provider can help determine your BMI and help you achieve or maintain a healthy weight.  For females 22 years of age and older:   A BMI below 18.5 is considered underweight.  A BMI of 18.5 to 24.9 is normal.  A BMI of 25 to 29.9 is considered overweight.  A BMI of 30 and above is considered obese.  Watch levels of cholesterol and  blood lipids  You should start having your blood tested for lipids and cholesterol at 56 years of age, then have this test every 5 years.  You may need to have your cholesterol levels checked more often if:  Your lipid or cholesterol levels are high.  You are older than 56 years of age.  You are at high risk for heart disease.  CANCER SCREENING   Lung Cancer  Lung cancer screening is recommended for adults 18-61 years old who are at high risk for lung cancer because of a history of smoking.  A yearly low-dose CT scan of the lungs is recommended for people who:  Currently smoke.  Have quit within the past 15 years.  Have at least a 30-pack-year history of smoking. A pack year is smoking an average of one pack of cigarettes a day for 1 year.  Yearly screening should continue until it has been 15 years since you quit.  Yearly screening should stop if you develop a health problem that would prevent you from having lung cancer treatment.  Breast Cancer  Practice breast self-awareness. This means understanding how your breasts normally appear and feel.  It also means doing regular breast self-exams. Let your health care provider know about any changes, no matter how small.  If you are in your 20s or 30s, you should have a clinical breast exam (CBE) by a health care provider every 1-3 years as part of  a regular health exam.  If you are 70 or older, have a CBE every year. Also consider having a breast X-ray (mammogram) every year.  If you have a family history of breast cancer, talk to your health care provider about genetic screening.  If you are at high risk for breast cancer, talk to your health care provider about having an MRI and a mammogram every year.  Breast cancer gene (BRCA) assessment is recommended for women who have family members with BRCA-related cancers. BRCA-related cancers include:  Breast.  Ovarian.  Tubal.  Peritoneal cancers.  Results of the  assessment will determine the need for genetic counseling and BRCA1 and BRCA2 testing. Cervical Cancer Your health care provider may recommend that you be screened regularly for cancer of the pelvic organs (ovaries, uterus, and vagina). This screening involves a pelvic examination, including checking for microscopic changes to the surface of your cervix (Pap test). You may be encouraged to have this screening done every 3 years, beginning at age 70.  For women ages 55-65, health care providers may recommend pelvic exams and Pap testing every 3 years, or they may recommend the Pap and pelvic exam, combined with testing for human papilloma virus (HPV), every 5 years. Some types of HPV increase your risk of cervical cancer. Testing for HPV may also be done on women of any age with unclear Pap test results.  Other health care providers may not recommend any screening for nonpregnant women who are considered low risk for pelvic cancer and who do not have symptoms. Ask your health care provider if a screening pelvic exam is right for you.  If you have had past treatment for cervical cancer or a condition that could lead to cancer, you need Pap tests and screening for cancer for at least 20 years after your treatment. If Pap tests have been discontinued, your risk factors (such as having a new sexual partner) need to be reassessed to determine if screening should resume. Some women have medical problems that increase the chance of getting cervical cancer. In these cases, your health care provider may recommend more frequent screening and Pap tests. Colorectal Cancer  This type of cancer can be detected and often prevented.  Routine colorectal cancer screening usually begins at 56 years of age and continues through 56 years of age.  Your health care provider may recommend screening at an earlier age if you have risk factors for colon cancer.  Your health care provider may also recommend using home test kits  to check for hidden blood in the stool.  A small camera at the end of a tube can be used to examine your colon directly (sigmoidoscopy or colonoscopy). This is done to check for the earliest forms of colorectal cancer.  Routine screening usually begins at age 28.  Direct examination of the colon should be repeated every 5-10 years through 56 years of age. However, you may need to be screened more often if early forms of precancerous polyps or small growths are found. Skin Cancer  Check your skin from head to toe regularly.  Tell your health care provider about any new moles or changes in moles, especially if there is a change in a mole's shape or color.  Also tell your health care provider if you have a mole that is larger than the size of a pencil eraser.  Always use sunscreen. Apply sunscreen liberally and repeatedly throughout the day.  Protect yourself by wearing long sleeves, pants, a wide-brimmed  hat, and sunglasses whenever you are outside. HEART DISEASE, DIABETES, AND HIGH BLOOD PRESSURE   High blood pressure causes heart disease and increases the risk of stroke. High blood pressure is more likely to develop in:  People who have blood pressure in the high end of the normal range (130-139/85-89 mm Hg).  People who are overweight or obese.  People who are African American.  If you are 82-40 years of age, have your blood pressure checked every 3-5 years. If you are 10 years of age or older, have your blood pressure checked every year. You should have your blood pressure measured twice--once when you are at a hospital or clinic, and once when you are not at a hospital or clinic. Record the average of the two measurements. To check your blood pressure when you are not at a hospital or clinic, you can use:  An automated blood pressure machine at a pharmacy.  A home blood pressure monitor.  If you are between 76 years and 5 years old, ask your health care provider if you should  take aspirin to prevent strokes.  Have regular diabetes screenings. This involves taking a blood sample to check your fasting blood sugar level.  If you are at a normal weight and have a low risk for diabetes, have this test once every three years after 56 years of age.  If you are overweight and have a high risk for diabetes, consider being tested at a younger age or more often. PREVENTING INFECTION  Hepatitis B  If you have a higher risk for hepatitis B, you should be screened for this virus. You are considered at high risk for hepatitis B if:  You were born in a country where hepatitis B is common. Ask your health care provider which countries are considered high risk.  Your parents were born in a high-risk country, and you have not been immunized against hepatitis B (hepatitis B vaccine).  You have HIV or AIDS.  You use needles to inject street drugs.  You live with someone who has hepatitis B.  You have had sex with someone who has hepatitis B.  You get hemodialysis treatment.  You take certain medicines for conditions, including cancer, organ transplantation, and autoimmune conditions. Hepatitis C  Blood testing is recommended for:  Everyone born from [birth year] through 1966.  Anyone with known risk factors for hepatitis C. Sexually transmitted infections (STIs)  You should be screened for sexually transmitted infections (STIs) including gonorrhea and chlamydia if:  You are sexually active and are younger than 56 years of age.  You are older than 56 years of age and your health care provider tells you that you are at risk for this type of infection.  Your sexual activity has changed since you were last screened and you are at an increased risk for chlamydia or gonorrhea. Ask your health care provider if you are at risk.  If you do not have HIV, but are at risk, it may be recommended that you take a prescription medicine daily to prevent HIV infection. This is called  pre-exposure prophylaxis (PrEP). You are considered at risk if:  You are sexually active and do not regularly use condoms or know the HIV status of your partner(s).  You take drugs by injection.  You are sexually active with a partner who has HIV. Talk with your health care provider about whether you are at high risk of being infected with HIV. If you choose to begin PrEP, you  should first be tested for HIV. You should then be tested every 3 months for as long as you are taking PrEP.  PREGNANCY   If you are premenopausal and you may become pregnant, ask your health care provider about preconception counseling.  If you may become pregnant, take 400 to 800 micrograms (mcg) of folic acid every day.  If you want to prevent pregnancy, talk to your health care provider about birth control (contraception). OSTEOPOROSIS AND MENOPAUSE   Osteoporosis is a disease in which the bones lose minerals and strength with aging. This can result in serious bone fractures. Your risk for osteoporosis can be identified using a bone density scan.  If you are 54 years of age or older, or if you are at risk for osteoporosis and fractures, ask your health care provider if you should be screened.  Ask your health care provider whether you should take a calcium or vitamin D supplement to lower your risk for osteoporosis.  Menopause may have certain physical symptoms and risks.  Hormone replacement therapy may reduce some of these symptoms and risks. Talk to your health care provider about whether hormone replacement therapy is right for you.  HOME CARE INSTRUCTIONS   Schedule regular health, dental, and eye exams.  Stay current with your immunizations.   Do not use any tobacco products including cigarettes, chewing tobacco, or electronic cigarettes.  If you are pregnant, do not drink alcohol.  If you are breastfeeding, limit how much and how often you drink alcohol.  Limit alcohol intake to no more than 1  drink per day for nonpregnant women. One drink equals 12 ounces of beer, 5 ounces of wine, or 1 ounces of hard liquor.  Do not use street drugs.  Do not share needles.  Ask your health care provider for help if you need support or information about quitting drugs.  Tell your health care provider if you often feel depressed.  Tell your health care provider if you have ever been abused or do not feel safe at home.   This information is not intended to replace advice given to you by your health care provider. Make sure you discuss any questions you have with your health care provider.   Document Released: 10/04/2010 Document Revised: 04/11/2014 Document Reviewed: 02/20/2013 Elsevier Interactive Patient Education [employer].

## 2015-10-09 ENCOUNTER — Encounter: Payer: Self-pay | Admitting: Internal Medicine

## 2015-10-09 DIAGNOSIS — J209 Acute bronchitis, unspecified: Secondary | ICD-10-CM | POA: Insufficient documentation

## 2015-10-09 DIAGNOSIS — Z0000 Encounter for general adult medical examination without abnormal findings: Secondary | ICD-10-CM | POA: Insufficient documentation

## 2015-10-09 DIAGNOSIS — Z0001 Encounter for general adult medical examination with abnormal findings: Secondary | ICD-10-CM | POA: Insufficient documentation

## 2015-10-09 NOTE — Assessment & Plan Note (Signed)
Colonoscopy and mammogram and pap smear up to date. Immunizations up to date and tdap given at visit. Counseled about the need to stop smoking and the risk she has increasing of chronic lung disease with current illness. Given 10 year screening recommendations.

## 2015-10-09 NOTE — Assessment & Plan Note (Signed)
Reminded to quit smoking and that her current coughing is related.

## 2015-10-09 NOTE — Assessment & Plan Note (Signed)
Symptoms going on for 2 weeks now and still with some wheezing on exam. Rx for doxycycline for the infection and albuterol inhaler.

## 2015-10-15 DIAGNOSIS — Z0100 Encounter for examination of eyes and vision without abnormal findings: Secondary | ICD-10-CM | POA: Diagnosis not present

## 2015-10-19 ENCOUNTER — Ambulatory Visit
Admission: RE | Admit: 2015-10-19 | Discharge: 2015-10-19 | Disposition: A | Discharge status: Home / Self Care | Payer: Medicare HMO | Source: Ambulatory Visit | Attending: Gynecology | Admitting: Gynecology

## 2015-10-19 DIAGNOSIS — Z1231 Encounter for screening mammogram for malignant neoplasm of breast: Secondary | ICD-10-CM | POA: Diagnosis not present

## 2015-10-29 ENCOUNTER — Other Ambulatory Visit: Payer: Self-pay

## 2015-10-29 ENCOUNTER — Ambulatory Visit (HOSPITAL_COMMUNITY): Payer: Self-pay | Admitting: Psychiatry

## 2016-03-14 ENCOUNTER — Ambulatory Visit (INDEPENDENT_AMBULATORY_CARE_PROVIDER_SITE_OTHER): Payer: Medicare HMO | Admitting: Internal Medicine

## 2016-03-14 ENCOUNTER — Encounter: Payer: Self-pay | Admitting: Internal Medicine

## 2016-03-14 VITALS — BP 110/60 | HR 92 | Temp 98.2°F | Resp 16 | Ht 65.0 in | Wt 184.1 lb

## 2016-03-14 DIAGNOSIS — L700 Acne vulgaris: Secondary | ICD-10-CM | POA: Diagnosis not present

## 2016-03-14 DIAGNOSIS — H109 Unspecified conjunctivitis: Secondary | ICD-10-CM | POA: Diagnosis not present

## 2016-03-14 DIAGNOSIS — Z23 Encounter for immunization: Secondary | ICD-10-CM | POA: Diagnosis not present

## 2016-03-14 DIAGNOSIS — B9689 Other specified bacterial agents as the cause of diseases classified elsewhere: Secondary | ICD-10-CM | POA: Insufficient documentation

## 2016-03-14 MED ORDER — ERYTHROMYCIN 5 MG/GM OP OINT: 1.0000 "application " | TOPICAL_OINTMENT | Freq: Every day | OPHTHALMIC | 0 refills | Status: DC

## 2016-03-14 NOTE — Assessment & Plan Note (Signed)
Erythromycin ointment. Not contact lens wearer. Advised of return precautions.

## 2016-03-14 NOTE — Progress Notes (Signed)
Pre visit review using our clinic review tool, if applicable. No additional management support is needed unless otherwise documented below in the visit note. 

## 2016-03-14 NOTE — Patient Instructions (Signed)
We have sent in the eye ointment. Put a thin ribbon on the lower eyelid and blink several times at bedtime for the next 5 days.   We have put in the referral to the dermatologist.

## 2016-03-14 NOTE — Progress Notes (Signed)
   Subjective:    Patient ID: Crystal Duarte, female    DOB: 11/25/1960, 55 y.o.   MRN: [id number]  HPI The patient is a 55 YO female coming in for black heads on her face and for possible pink eye in her eyes for 1 day. She has not tried anything for it. The black heads for about 6 months and she tried having a facial which did not help. Eyes itching last day and watering. They crusted over this morning and she had to use a warm compress to free the crust. Takes care of her young grandchildren sometimes and they had some stuff recently. No fevers or chills. No other sinus problems.   Review of Systems  Constitutional: Negative for activity change, appetite change, chills, fatigue, fever and unexpected weight change.  HENT: Negative for congestion, ear discharge, ear pain, postnasal drip, rhinorrhea, sinus pressure, sneezing and sore throat.   Eyes: Positive for photophobia, discharge, redness and itching. Negative for pain and visual disturbance.  Respiratory: Negative.   Cardiovascular: Negative.   Gastrointestinal: Negative.       Objective:   Physical Exam  Constitutional: She is oriented to person, place, and time. She appears well-developed and well-nourished.  HENT:  Head: Normocephalic and atraumatic.  Mouth/Throat: Oropharynx is clear and moist.  Eyes: EOM are normal. Pupils are equal, round, and reactive to light.  Consistent with bacterial conjunctivitis  Neck: Normal range of motion.  Cardiovascular: Normal rate and regular rhythm.   Pulmonary/Chest: Effort normal and breath sounds normal. No respiratory distress. She has no wheezes. She has no rales.  Abdominal: Soft.  Lymphadenopathy:    She has no cervical adenopathy.  Neurological: She is alert and oriented to person, place, and time.  Skin: Skin is warm and dry.   Vitals:   03/14/16 1334  BP: 110/60  Pulse: 92  Resp: 16  Temp: 98.2 F (36.8 C)  TempSrc: Oral  SpO2: 98%  Weight: 184 lb 1.9 oz (83.5 kg)    Height: 5\' 5"  (1.651 m)      Assessment & Plan:  Flu shot given at visit.

## 2016-03-14 NOTE — Assessment & Plan Note (Signed)
- 

## 2016-04-01 DIAGNOSIS — L720 Epidermal cyst: Secondary | ICD-10-CM | POA: Diagnosis not present

## 2016-04-14 DIAGNOSIS — R69 Illness, unspecified: Secondary | ICD-10-CM | POA: Diagnosis not present

## 2016-04-15 DIAGNOSIS — R69 Illness, unspecified: Secondary | ICD-10-CM | POA: Diagnosis not present

## 2016-05-09 ENCOUNTER — Encounter: Payer: Self-pay | Admitting: Internal Medicine

## 2016-05-09 ENCOUNTER — Ambulatory Visit (INDEPENDENT_AMBULATORY_CARE_PROVIDER_SITE_OTHER): Payer: Medicare HMO | Admitting: Internal Medicine

## 2016-05-09 DIAGNOSIS — J111 Influenza due to unidentified influenza virus with other respiratory manifestations: Secondary | ICD-10-CM

## 2016-05-09 DIAGNOSIS — R69 Illness, unspecified: Secondary | ICD-10-CM

## 2016-05-09 MED ORDER — HYDROCODONE-HOMATROPINE 5-1.5 MG/5ML PO SYRP: 5.0000 mL | ORAL_SOLUTION | Freq: Three times a day (TID) | ORAL | 0 refills | Status: DC | PRN

## 2016-05-09 MED ORDER — CYCLOBENZAPRINE HCL 5 MG PO TABS: 5.0000 mg | ORAL_TABLET | Freq: Three times a day (TID) | ORAL | 1 refills | Status: DC | PRN

## 2016-05-09 MED ORDER — LEVOCETIRIZINE DIHYDROCHLORIDE 5 MG PO TABS: 5.0000 mg | ORAL_TABLET | Freq: Every evening | ORAL | 6 refills | Status: DC

## 2016-05-09 NOTE — Assessment & Plan Note (Addendum)
Rx for xyzal for congestion and hycodan cough syrup. She is outside the window for tamiflu and we talked about typical course and supportive measures. No indication for antibiotics or steroids. Refill albuterol inhaler if needed. Rx for flexeril for muscle aches.

## 2016-05-09 NOTE — Progress Notes (Signed)
Pre visit review using our clinic review tool, if applicable. No additional management support is needed unless otherwise documented below in the visit note. 

## 2016-05-09 NOTE — Progress Notes (Signed)
   Subjective:    Patient ID: Crystal Duarte, female    DOB: 1960/10/02, 57 y.o.   MRN: [id number]  HPI The patient is a 56 YO female coming in for cough and congestion since Saturday. She has been around several sick contacts. She denies fevers but is having chills. She is still smoking. She denies SOB. She is fatigued and has some muscle soreness. Overall worsening since onset. She has tried some OTC cold medication which was not that helpful.   Review of Systems  Constitutional: Positive for activity change, chills and fatigue. Negative for appetite change, fever and unexpected weight change.  HENT: Positive for congestion, postnasal drip, rhinorrhea, sinus pain, sinus pressure and sore throat. Negative for ear discharge, ear pain and trouble swallowing.   Eyes: Negative.   Respiratory: Positive for cough. Negative for choking, chest tightness, shortness of breath and wheezing.   Cardiovascular: Negative.  Negative for chest pain.  Gastrointestinal: Negative.   Musculoskeletal: Positive for myalgias.  Neurological: Positive for headaches.      Objective:   Physical Exam  Constitutional: She is oriented to person, place, and time. She appears well-developed and well-nourished.  HENT:  Head: Normocephalic and atraumatic.  Right Ear: External ear normal.  Left Ear: External ear normal.  Oropharynx with redness and clear drainage, nose without crusting, mild sinus pressure frontal.   Eyes: EOM are normal.  Cardiovascular: Normal rate and regular rhythm.   Pulmonary/Chest: Effort normal and breath sounds normal. No respiratory distress. She has no wheezes. She has no rales.  Abdominal: Soft.  Lymphadenopathy:    She has no cervical adenopathy.  Neurological: She is alert and oriented to person, place, and time.  Skin: Skin is warm and dry.   Vitals:   05/09/16 1357  BP: 134/80  Pulse: 88  Temp: 98.3 F (36.8 C)  TempSrc: Oral  SpO2: 98%  Weight: 194 lb (88 kg)  Height: 5\' 5"   (1.651 m)      Assessment & Plan:

## 2016-05-09 NOTE — Patient Instructions (Addendum)
We have sent in tamiflu for your mother. She should take 1 pill twice a day for 5 days.   We have given you the cough syrup medicine today that you can use for cough.  We have sent in the congestion medicine called xyzal which you take 1 pill daily to help with congestion.   We have sent in a muscle relaxer for the body aches that you can take which should also help with the breathing as you will be able to get full breaths.

## 2016-06-15 DIAGNOSIS — R69 Illness, unspecified: Secondary | ICD-10-CM | POA: Diagnosis not present

## 2016-07-14 DIAGNOSIS — F17210 Nicotine dependence, cigarettes, uncomplicated: Secondary | ICD-10-CM | POA: Diagnosis not present

## 2016-07-14 DIAGNOSIS — R45851 Suicidal ideations: Secondary | ICD-10-CM | POA: Diagnosis not present

## 2016-07-14 DIAGNOSIS — F319 Bipolar disorder, unspecified: Secondary | ICD-10-CM | POA: Diagnosis not present

## 2016-07-14 DIAGNOSIS — G4700 Insomnia, unspecified: Secondary | ICD-10-CM | POA: Diagnosis not present

## 2016-07-14 DIAGNOSIS — Z882 Allergy status to sulfonamides status: Secondary | ICD-10-CM | POA: Diagnosis not present

## 2016-07-14 DIAGNOSIS — R69 Illness, unspecified: Secondary | ICD-10-CM | POA: Diagnosis not present

## 2016-07-15 DIAGNOSIS — G4700 Insomnia, unspecified: Secondary | ICD-10-CM | POA: Diagnosis not present

## 2016-07-15 DIAGNOSIS — R69 Illness, unspecified: Secondary | ICD-10-CM | POA: Diagnosis not present

## 2016-07-27 DIAGNOSIS — R69 Illness, unspecified: Secondary | ICD-10-CM | POA: Diagnosis not present

## 2016-07-28 ENCOUNTER — Encounter: Payer: Self-pay | Admitting: Internal Medicine

## 2016-07-28 ENCOUNTER — Other Ambulatory Visit (INDEPENDENT_AMBULATORY_CARE_PROVIDER_SITE_OTHER): Payer: Medicare HMO

## 2016-07-28 ENCOUNTER — Ambulatory Visit (INDEPENDENT_AMBULATORY_CARE_PROVIDER_SITE_OTHER): Payer: Medicare HMO | Admitting: Internal Medicine

## 2016-07-28 ENCOUNTER — Telehealth: Payer: Self-pay | Admitting: Internal Medicine

## 2016-07-28 VITALS — BP 124/76 | HR 85 | Temp 98.2°F | Resp 14 | Ht 65.0 in | Wt 202.0 lb

## 2016-07-28 DIAGNOSIS — F3162 Bipolar disorder, current episode mixed, moderate: Secondary | ICD-10-CM

## 2016-07-28 DIAGNOSIS — F17200 Nicotine dependence, unspecified, uncomplicated: Secondary | ICD-10-CM | POA: Diagnosis not present

## 2016-07-28 DIAGNOSIS — G4700 Insomnia, unspecified: Secondary | ICD-10-CM | POA: Diagnosis not present

## 2016-07-28 DIAGNOSIS — R300 Dysuria: Secondary | ICD-10-CM

## 2016-07-28 DIAGNOSIS — R7989 Other specified abnormal findings of blood chemistry: Secondary | ICD-10-CM

## 2016-07-28 DIAGNOSIS — R69 Illness, unspecified: Secondary | ICD-10-CM | POA: Diagnosis not present

## 2016-07-28 LAB — COMPREHENSIVE METABOLIC PANEL
ALT: 15 U/L (ref 0–35)
AST: 14 U/L (ref 0–37)
Albumin: 4 g/dL (ref 3.5–5.2)
Alkaline Phosphatase: 79 U/L (ref 39–117)
BUN: 10 mg/dL (ref 6–23)
CO2: 33 mEq/L — ABNORMAL HIGH (ref 19–32)
Calcium: 9.5 mg/dL (ref 8.4–10.5)
Chloride: 102 mEq/L (ref 96–112)
Creatinine, Ser: 0.85 mg/dL (ref 0.40–1.20)
GFR: 73.25 mL/min (ref >60.00)
Glucose, Bld: 90 mg/dL (ref 70–99)
Potassium: 4.1 mEq/L (ref 3.5–5.1)
Sodium: 139 mEq/L (ref 135–145)
Total Bilirubin: 0.4 mg/dL (ref 0.2–1.2)
Total Protein: 6.6 g/dL (ref 6.0–8.3)

## 2016-07-28 LAB — POCT URINALYSIS DIPSTICK
Bilirubin, UA: NEGATIVE
Blood, UA: NEGATIVE
Glucose, UA: NEGATIVE
Ketones, UA: NEGATIVE
Leukocytes, UA: NEGATIVE
Nitrite, UA: NEGATIVE
Protein, UA: NEGATIVE
Spec Grav, UA: 1.015 (ref 1.010–1.025)
Urobilinogen, UA: 0.2 E.U./dL
pH, UA: 6.5 (ref 5.0–8.0)

## 2016-07-28 LAB — CBC
HCT: 38.9 % (ref 36.0–46.0)
Hemoglobin: 12.9 g/dL (ref 12.0–15.0)
MCHC: 33 g/dL (ref 30.0–36.0)
MCV: 90.6 fl (ref 78.0–100.0)
Platelets: 292 10*3/uL (ref 150.0–400.0)
RBC: 4.3 Mil/uL (ref 3.87–5.11)
RDW: 14 % (ref 11.5–15.5)
WBC: 11.8 10*3/uL — ABNORMAL HIGH (ref 4.0–10.5)

## 2016-07-28 LAB — VITAMIN B12: Vitamin B-12: 895 pg/mL (ref 211–911)

## 2016-07-28 LAB — LDL CHOLESTEROL, DIRECT: Direct LDL: 77 mg/dL

## 2016-07-28 LAB — LIPID PANEL
Cholesterol: 205 mg/dL — ABNORMAL HIGH (ref 0–200)
HDL: 51.2 mg/dL (ref >39.00)
Total CHOL/HDL Ratio: 4
Triglycerides: 412 mg/dL — ABNORMAL HIGH (ref 0.0–149.0)

## 2016-07-28 LAB — HEMOGLOBIN A1C: Hgb A1c MFr Bld: 6.1 % (ref 4.6–6.5)

## 2016-07-28 MED ORDER — FLUCONAZOLE 150 MG PO TABS: 150.0000 mg | ORAL_TABLET | Freq: Once | ORAL | 0 refills | Status: AC

## 2016-07-28 NOTE — Telephone Encounter (Signed)
States fluconazole (DIFLUCAN) 150 MG tablet interacts with    ARIPiprazole (ABILIFY) 5 MG  Trazedone- Dr. Susette Racer 4/25 Seroquel- Dr. Susette Racer 4/25

## 2016-07-28 NOTE — Progress Notes (Signed)
Pre visit review using our clinic review tool, if applicable. No additional management support is needed unless otherwise documented below in the visit note. 

## 2016-07-28 NOTE — Progress Notes (Signed)
   Subjective:    Patient ID: Crystal Duarte, female    DOB: 1960/10/18, 56 y.o.   MRN: [id number]  HPI The patient is a 56 YO female coming in for hospital follow up (in for concerns about her mental health, she was monitored overnight and medications changed, she was not sleeping for 5 days prior to hospital stay due to medications). She is not happy with her current psychiatrist due to poor care. She was placed on seroquel and gained 30 pounds and now is feeling even worse about her mental health. She is feeling more depressed. The medication changes have caused her to be able to sleep better. If she is taking 100 mg trazodone she is okay during the day if she takes 200 mg trazodone she noticed that she feels extra tired and cannot wake up during the day. She denies active SI/HI but she is feeling very depressed. She denies new medical conditions. She denies side effects of her new medication regimen.   PMH, [facility], social history reviewed and updated.   Review of Systems  Constitutional: Positive for activity change, appetite change and fatigue. Negative for chills, fever and unexpected weight change.  HENT: Negative.   Eyes: Negative.   Respiratory: Negative.   Cardiovascular: Negative.   Gastrointestinal: Negative.   Musculoskeletal: Negative.   Skin: Negative.   Neurological: Negative.   Psychiatric/Behavioral: Positive for agitation, decreased concentration, dysphoric mood and sleep disturbance. Negative for behavioral problems, hallucinations, self-injury and suicidal ideas. The patient is nervous/anxious. The patient is not hyperactive.       Objective:   Physical Exam  Constitutional: She is oriented to person, place, and time. She appears well-developed and well-nourished.  HENT:  Head: Normocephalic and atraumatic.  Eyes: EOM are normal.  Neck: Normal range of motion.  Cardiovascular: Normal rate and regular rhythm.   Pulmonary/Chest: Effort normal and breath sounds normal.    Abdominal: Soft. She exhibits no distension. There is no tenderness. There is no rebound.  Neurological: She is alert and oriented to person, place, and time.  Skin: Skin is warm and dry.  Psychiatric:  Mood is very flat during the visit with some agitation while talking about her psych care   Vitals:   07/28/16 1346  BP: 124/76  Pulse: 85  Resp: 14  Temp: 98.2 F (36.8 C)  TempSrc: Oral  SpO2: 96%  Weight: 202 lb (91.6 kg)  Height: 5\' 5"  (1.651 m)      Assessment & Plan:

## 2016-07-28 NOTE — Patient Instructions (Addendum)
We will get you in with the psychiatrist to help adjust the medicines.   It is okay to take 1 and 1/2 pills of trazodone at night time instead of 200 mg to help you sleep that will hopefully not make you tired during the day.   You do not have any infection in the urine so we will try treating you for a yeast infection with diflucan. Take 1 pill today to clear this.

## 2016-07-29 ENCOUNTER — Encounter: Payer: Self-pay | Admitting: Internal Medicine

## 2016-07-29 NOTE — Telephone Encounter (Signed)
Okay to dispense

## 2016-07-29 NOTE — Telephone Encounter (Signed)
Please advise 

## 2016-07-29 NOTE — Assessment & Plan Note (Signed)
She is in a severe flare at this time with mixed depression and mania. She is not sleeping and using trazodone for sleep at this time. She is also taking cymbalta and abilify and klonopin from the psych ER. She knows that I cannot treat her for this. Referral done for psych and she is given some offices to contact about getting in for a visit. Given information about [facility] which has walk in services for adjustment in case there is a wait to get in with psych.

## 2016-07-29 NOTE — Telephone Encounter (Signed)
Pharmacy contacted.

## 2016-07-29 NOTE — Assessment & Plan Note (Signed)
Does not wish to quit at this time and does not feel like discussing with her mental health concerns.

## 2016-08-17 ENCOUNTER — Encounter: Payer: Self-pay | Admitting: Gynecology

## 2016-08-23 DIAGNOSIS — R69 Illness, unspecified: Secondary | ICD-10-CM | POA: Diagnosis not present

## 2016-09-26 DIAGNOSIS — F411 Generalized anxiety disorder: Secondary | ICD-10-CM | POA: Diagnosis not present

## 2016-09-26 DIAGNOSIS — R69 Illness, unspecified: Secondary | ICD-10-CM | POA: Diagnosis not present

## 2016-09-30 ENCOUNTER — Telehealth: Payer: Self-pay

## 2016-09-30 NOTE — Telephone Encounter (Signed)
Patient is on the list for [employer] 2018 and may be a good candidate for an AWV. Please let me know if/when appt is scheduled.   

## 2016-10-13 ENCOUNTER — Encounter: Payer: Self-pay | Admitting: Gynecology

## 2016-10-13 ENCOUNTER — Ambulatory Visit (INDEPENDENT_AMBULATORY_CARE_PROVIDER_SITE_OTHER): Payer: Medicare HMO | Admitting: Gynecology

## 2016-10-13 VITALS — BP 120/84 | Ht 65.0 in | Wt 199.0 lb

## 2016-10-13 DIAGNOSIS — Z780 Asymptomatic menopausal state: Secondary | ICD-10-CM

## 2016-10-13 DIAGNOSIS — M8580 Other specified disorders of bone density and structure, unspecified site: Secondary | ICD-10-CM

## 2016-10-13 DIAGNOSIS — Z01419 Encounter for gynecological examination (general) (routine) without abnormal findings: Secondary | ICD-10-CM

## 2016-10-13 NOTE — Patient Instructions (Signed)
Bone Densitometry Bone densitometry is an imaging test that uses a special X-ray to measure the amount of calcium and other minerals in your bones (bone density). This test is also known as a bone mineral density test or dual-energy X-ray absorptiometry (DXA). The test can measure bone density at your hip and your spine. It is similar to having a regular X-ray. You may have this test to:  Diagnose a condition that causes weak or thin bones (osteoporosis).  Predict your risk of a broken bone (fracture).  Determine how well osteoporosis treatment is working.  Tell a health care provider about:  Any allergies you have.  All medicines you are taking, including vitamins, herbs, eye drops, creams, and over-the-counter medicines.  Any problems you or family members have had with anesthetic medicines.  Any blood disorders you have.  Any surgeries you have had.  Any medical conditions you have.  Possibility of pregnancy.  Any other medical test you had within the previous 14 days that used contrast material. What are the risks? Generally, this is a safe procedure. However, problems can occur and may include the following:  This test exposes you to a very small amount of radiation.  The risks of radiation exposure may be greater to unborn children.  What happens before the procedure?  Do not take any calcium supplements for 24 hours before having the test. You can otherwise eat and drink what you usually do.  Take off all metal jewelry, eyeglasses, dental appliances, and any other metal objects. What happens during the procedure?  You may lie on an exam table. There will be an X-ray generator below you and an imaging device above you.  Other devices, such as boxes or braces, may be used to position your body properly for the scan.  You will need to lie still while the machine slowly scans your body.  The images will show up on a computer monitor. What happens after the  procedure? You may need more testing at a later time. This information is not intended to replace advice given to you by your health care provider. Make sure you discuss any questions you have with your health care provider. Document Released: 04/12/2004 Document Revised: 08/27/2015 Document Reviewed: 08/29/2013 Elsevier Interactive Patient Education  2018 Elsevier Inc.   

## 2016-10-13 NOTE — Progress Notes (Signed)
Crystal Duarte 03/28/1961 [id number]   History:    56 y.o.  for annual gyn exam with no complaints today. Patient menopausal on no hormone replacement therapy. Patient's stop smoking one year ago.Review of her record indicated in 2015 her Pap smear demonstrated the following:  LOW GRADE SQUAMOUS INTRAEPITHELIAL LESION: VAIN-1/ HPV (LSIL).  Specimen Clinical Information Hysterectomy Source Vaginal Pap [ThinPrep Imaged] Ancillary Testing HPV 16,18/45 Genotyping NEGATIVE for HPV 16 & 18/45 Completed by [id number] on 2013-09-10 HPV High Risk **DETECTED**  She underwent a detail colposcopic examination no abnormality was noted. Patient had a normal Pap smear 2017.Patient with past history of transvaginal hysterectomy with bilateral salpingo-oophorectomy several years ago.She has history of severe depression and is currently on Cymbalta and trazodone who has been seeing Dr. Sheralyn Boatman.  Her recent colonoscopy in 2016 demonstrated benign colon polyps that she is on a 5 year recall. In 2014 she had a chest x-ray which demonstrated COPD changes. Bone density study done in 2016 lowest T score -1.3 at the right femoral neck normal Frax analysis.    Past medical history,surgical history, family history and social history were all reviewed and documented in the EPIC chart.  Gynecologic History No LMP recorded. Patient has had a hysterectomy. Contraception: status post hysterectomy Last Pap: 2017. Results were: normal Last mammogram: 2017. Results were: normal  Obstetric History OB History  Gravida Para Term Preterm AB Living  2 2       2   SAB TAB Ectopic Multiple Live Births               # Outcome Date GA Lbr Len/2nd Weight Sex Delivery Anes PTL Lv  2 Para           1 Para                ROS: A ROS was performed and pertinent positives and negatives are included in the history.  GENERAL: No fevers or chills. HEENT: No change in vision, no earache, sore throat or sinus  congestion. NECK: No pain or stiffness. CARDIOVASCULAR: No chest pain or pressure. No palpitations. PULMONARY: No shortness of breath, cough or wheeze. GASTROINTESTINAL: No abdominal pain, nausea, vomiting or diarrhea, melena or bright red blood per rectum. GENITOURINARY: No urinary frequency, urgency, hesitancy or dysuria. MUSCULOSKELETAL: No joint or muscle pain, no back pain, no recent trauma. DERMATOLOGIC: No rash, no itching, no lesions. ENDOCRINE: No polyuria, polydipsia, no heat or cold intolerance. No recent change in weight. HEMATOLOGICAL: No anemia or easy bruising or bleeding. NEUROLOGIC: No headache, seizures, numbness, tingling or weakness. PSYCHIATRIC: No depression, no loss of interest in normal activity or change in sleep pattern.     Exam: chaperone present  BP 120/84   Ht 5\' 5"  (1.651 m)   Wt 199 lb (90.3 kg)   BMI 33.12 kg/m   Body mass index is 33.12 kg/m.  General appearance : Well developed well nourished female. No acute distress HEENT: Eyes: no retinal hemorrhage or exudates,  Neck supple, trachea midline, no carotid bruits, no thyroidmegaly Lungs: Clear to auscultation, no rhonchi or wheezes, or rib retractions  Heart: Regular rate and rhythm, no murmurs or gallops Breast:Examined in sitting and supine position were symmetrical in appearance, no palpable masses or tenderness,  no skin retraction, no nipple inversion, no nipple discharge, no skin discoloration, no axillary or supraclavicular lymphadenopathy Abdomen: no palpable masses or tenderness, no rebound or guarding Extremities: no edema or skin discoloration or tenderness  Pelvic:  Bartholin,  Urethra, Skene Glands: Within normal limits             Vagina: No gross lesions or discharge  Cervix: Absent  Uterus  absent  Adnexa  Without masses or tenderness  Anus and perineum  normal   Rectovaginal  normal sphincter tone without palpated masses or tenderness             Hemoccult PCP provides      Assessment/Plan:  56 y.o. female for annual exam doing well from a gynecological standpoint. Pap smear not indicated. Patient to schedule bone density study. Her PCP is been doing her blood work. Patient was provided with a requisition to schedule her overdue mammogram. We discussed importance of calcium vitamin D and weightbearing exercises for osteoporosis prevention.   Terrance Mass MD, 3:27 PM 10/13/2016

## 2016-10-20 ENCOUNTER — Other Ambulatory Visit: Payer: Self-pay | Admitting: Gynecology

## 2016-10-20 DIAGNOSIS — Z1231 Encounter for screening mammogram for malignant neoplasm of breast: Secondary | ICD-10-CM

## 2016-10-21 ENCOUNTER — Ambulatory Visit
Admission: RE | Admit: 2016-10-21 | Discharge: 2016-10-21 | Disposition: A | Discharge status: Home / Self Care | Payer: Medicare HMO | Source: Ambulatory Visit | Attending: Gynecology | Admitting: Gynecology

## 2016-10-21 DIAGNOSIS — Z1231 Encounter for screening mammogram for malignant neoplasm of breast: Secondary | ICD-10-CM

## 2016-10-24 DIAGNOSIS — R69 Illness, unspecified: Secondary | ICD-10-CM | POA: Diagnosis not present

## 2016-10-25 ENCOUNTER — Ambulatory Visit (HOSPITAL_COMMUNITY): Payer: Self-pay | Admitting: Psychiatry

## 2016-11-02 ENCOUNTER — Other Ambulatory Visit: Payer: Self-pay | Admitting: Gynecology

## 2016-11-02 ENCOUNTER — Telehealth: Payer: Self-pay | Admitting: Internal Medicine

## 2016-11-02 DIAGNOSIS — Z780 Asymptomatic menopausal state: Secondary | ICD-10-CM

## 2016-11-02 DIAGNOSIS — M8580 Other specified disorders of bone density and structure, unspecified site: Secondary | ICD-10-CM

## 2016-11-02 HISTORY — DX: Other specified disorders of bone density and structure, unspecified site: M85.80

## 2016-11-02 NOTE — Telephone Encounter (Signed)
Patient called back. I was able to get the fax over to. [phone]

## 2016-11-02 NOTE — Telephone Encounter (Signed)
Patient is needing some labs [facility]ed [facility].   The fax number given is not working. Patient will call back with a fax number.

## 2016-11-15 ENCOUNTER — Ambulatory Visit (INDEPENDENT_AMBULATORY_CARE_PROVIDER_SITE_OTHER): Payer: Medicare HMO

## 2016-11-15 ENCOUNTER — Other Ambulatory Visit: Payer: Self-pay | Admitting: Gynecology

## 2016-11-15 DIAGNOSIS — Z780 Asymptomatic menopausal state: Secondary | ICD-10-CM

## 2016-11-15 DIAGNOSIS — M8589 Other specified disorders of bone density and structure, multiple sites: Secondary | ICD-10-CM

## 2016-11-16 ENCOUNTER — Telehealth: Payer: Self-pay | Admitting: Gynecology

## 2016-11-16 ENCOUNTER — Other Ambulatory Visit: Payer: Self-pay | Admitting: Internal Medicine

## 2016-11-16 ENCOUNTER — Encounter: Payer: Self-pay | Admitting: Gynecology

## 2016-11-16 NOTE — Telephone Encounter (Signed)
Tell patient her most recent bone density shows osteopenia. Values are approaching osteoporosis but not to that degree yet. Calculated fracture risk is not elevated to indicate need for medication at this time. It is very important though that she starts weightbearing exercise on a regular basis such as walking, adequate calcium intake@1500  mg total dietary calcium daily and I would recommend checking a vitamin D level either through her primary physician's office or our office to make sure she is in the therapeutic range. If she is not then supplementing vitamin D would be helpful for her bones. Lastly if she continues to smoke stop smoking would be the best thing she could do for her total health and her bones. I would recommend repeating the bone density in 2 years

## 2016-11-16 NOTE — Telephone Encounter (Signed)
Pt informed, PCP will check vitamin d level.

## 2016-11-21 DIAGNOSIS — R69 Illness, unspecified: Secondary | ICD-10-CM | POA: Diagnosis not present

## 2016-12-08 ENCOUNTER — Encounter: Payer: Self-pay | Admitting: Internal Medicine

## 2016-12-13 ENCOUNTER — Other Ambulatory Visit: Payer: Self-pay | Admitting: Internal Medicine

## 2016-12-14 ENCOUNTER — Ambulatory Visit (INDEPENDENT_AMBULATORY_CARE_PROVIDER_SITE_OTHER): Payer: Medicare HMO | Admitting: Internal Medicine

## 2016-12-14 ENCOUNTER — Encounter: Payer: Self-pay | Admitting: Internal Medicine

## 2016-12-14 ENCOUNTER — Other Ambulatory Visit (INDEPENDENT_AMBULATORY_CARE_PROVIDER_SITE_OTHER): Payer: Self-pay

## 2016-12-14 VITALS — BP 102/68 | HR 69 | Temp 98.1°F | Ht 65.0 in | Wt 178.0 lb

## 2016-12-14 DIAGNOSIS — R634 Abnormal weight loss: Secondary | ICD-10-CM | POA: Insufficient documentation

## 2016-12-14 DIAGNOSIS — R0789 Other chest pain: Secondary | ICD-10-CM

## 2016-12-14 DIAGNOSIS — I951 Orthostatic hypotension: Secondary | ICD-10-CM

## 2016-12-14 LAB — CBC
HCT: 42 % (ref 36.0–46.0)
Hemoglobin: 13.8 g/dL (ref 12.0–15.0)
MCHC: 32.8 g/dL (ref 30.0–36.0)
MCV: 92.9 fl (ref 78.0–100.0)
Platelets: 254 10*3/uL (ref 150.0–400.0)
RBC: 4.52 Mil/uL (ref 3.87–5.11)
RDW: 14 % (ref 11.5–15.5)
WBC: 7.7 10*3/uL (ref 4.0–10.5)

## 2016-12-14 LAB — TROPONIN I: TNIDX: 0 ug/l (ref 0.00–0.06)

## 2016-12-14 LAB — COMPREHENSIVE METABOLIC PANEL
ALT: 13 U/L (ref 0–35)
AST: 13 U/L (ref 0–37)
Albumin: 4.2 g/dL (ref 3.5–5.2)
Alkaline Phosphatase: 70 U/L (ref 39–117)
BUN: 8 mg/dL (ref 6–23)
CO2: 28 mEq/L (ref 19–32)
Calcium: 9.8 mg/dL (ref 8.4–10.5)
Chloride: 103 mEq/L (ref 96–112)
Creatinine, Ser: 1.05 mg/dL (ref 0.40–1.20)
GFR: 57.32 mL/min — ABNORMAL LOW (ref >60.00)
Glucose, Bld: 106 mg/dL — ABNORMAL HIGH (ref 70–99)
Potassium: 3.9 mEq/L (ref 3.5–5.1)
Sodium: 141 mEq/L (ref 135–145)
Total Bilirubin: 0.6 mg/dL (ref 0.2–1.2)
Total Protein: 6.7 g/dL (ref 6.0–8.3)

## 2016-12-14 LAB — LIPASE: Lipase: 4 U/L — ABNORMAL LOW (ref 11.0–59.0)

## 2016-12-14 LAB — T4, FREE: Free T4: 0.86 ng/dL (ref 0.60–1.60)

## 2016-12-14 LAB — VITAMIN D 25 HYDROXY (VIT D DEFICIENCY, FRACTURES): VITD: 50.92 ng/mL (ref 30.00–100.00)

## 2016-12-14 LAB — TSH: TSH: 1.71 u[IU]/mL (ref 0.35–4.50)

## 2016-12-14 LAB — VITAMIN B12: Vitamin B-12: >1500 pg/mL — ABNORMAL HIGH (ref 211–911)

## 2016-12-14 NOTE — Progress Notes (Signed)
   Subjective:    Patient ID: Crystal Duarte, female    DOB: [birth year], 56 y.o.   MRN: [id number]  HPI The patient is a 56 YO female coming in for chest tightness (going on for 2-4 weeks, started with fatigue, she is not able to climb a flight of stairs without stopping due to breathing and tightness, smoker but stopped in the last several months, denies worsening cough or sputum), and weight loss (doing an hcg diet with hcg and B12 supplements at weight loss clinic, no carbs or sugar, down 25 pounds in 5 months, states intentional) and dizziness (more lightheadedness, still taking hctz daily, when she stands up she feels like she might pass out, has fallen several times, no energy and sleeping a lot). She is overall feeling very poorly, some changes to her depression medications and that is okay level of control. Denies recent travel or out of country travel in the last year. Denies change prior to start of symptoms other than diet. Symptoms are currently worsening.   Review of Systems  Constitutional: Positive for activity change, appetite change, fatigue and unexpected weight change. Negative for chills and fever.  HENT: Positive for congestion and postnasal drip. Negative for ear discharge, ear pain, rhinorrhea, sinus pain, sinus pressure and sore throat.   Eyes: Negative.   Respiratory: Positive for cough, chest tightness and shortness of breath. Negative for wheezing.   Cardiovascular: Positive for chest pain. Negative for palpitations and leg swelling.  Gastrointestinal: Positive for constipation and nausea. Negative for abdominal distention, abdominal pain, anal bleeding, blood in stool, diarrhea, rectal pain and vomiting.  Musculoskeletal: Positive for arthralgias and myalgias.  Skin: Negative.   Neurological: Positive for dizziness, weakness and light-headedness. Negative for syncope, speech difficulty and numbness.  Psychiatric/Behavioral: Negative.       Objective:   Physical Exam    Constitutional: She is oriented to person, place, and time. She appears well-developed. She appears distressed.  Mild distress  HENT:  Head: Normocephalic and atraumatic.  Right Ear: External ear normal.  Left Ear: External ear normal.  Oropharynx dry  Eyes: EOM are normal.  Neck: Normal range of motion. No JVD present.  Cardiovascular: Normal rate and regular rhythm.   Pulmonary/Chest: Effort normal and breath sounds normal. No respiratory distress. She has no wheezes. She has no rales.  Abdominal: Soft. Bowel sounds are normal. She exhibits no distension. There is no tenderness. There is no rebound.  Musculoskeletal: She exhibits no edema.  Lymphadenopathy:    She has no cervical adenopathy.  Neurological: She is alert and oriented to person, place, and time. Coordination abnormal.  Slow gait and lightheadedness with standing  Skin: Skin is warm and dry.   Vitals:   12/14/16 0958  BP: 102/68  Pulse: 69  Temp: 98.1 F (36.7 C)  TempSrc: Oral  SpO2: 99%  Weight: 178 lb (80.7 kg)  Height: 5\' 5"  (1.651 m)   EKG: Rate 59, sinus brady, intervals normal, axis normal, no st or t wave changes, no change from compared to 2015 except rate    Assessment & Plan:

## 2016-12-14 NOTE — Assessment & Plan Note (Signed)
Concerning given rapid weight loss and diet for cardiac effects. EKG done in the office without changes. Checking troponin as well as CBC, CMP for electrolyte changes.

## 2016-12-14 NOTE — Patient Instructions (Addendum)
We will check the labs today and call you back with the results.   The EKG of the heart is normal.   Stop taking hydrochlorothiazide (hctz) as it is likely giving you low blood pressure.   Increase the salt in your diet to help with dizziness until the blood pressure medicine is out of your system.  I would recommend to stop doing the diet as it could be the cause of your symptoms. You need a safer and slower method of losing weight to avoid health problems.

## 2016-12-14 NOTE — Assessment & Plan Note (Signed)
Concerning weight loss although the patient insists this is all with diet. 25 pounds recently with systemic symptoms. I have advised her to stop the diet she is currently on due to the effects on her body. Checking labs including CBC, CMP, iron, B12, thyroid, vitamin D for signs of malnutrition. She appears to have lost significant amount of weight recently on exam.

## 2016-12-14 NOTE — Assessment & Plan Note (Signed)
Stop hctz as she has lost about 25 pounds and is likely being over medicated for blood pressure. Checking CMP and CBC, thyroid, B12, vitamin D for cause. She does appear some dehydrated on exam and encouraged to increase food and liquids.

## 2016-12-16 ENCOUNTER — Telehealth: Payer: Self-pay | Admitting: Internal Medicine

## 2016-12-16 DIAGNOSIS — R0600 Dyspnea, unspecified: Secondary | ICD-10-CM

## 2016-12-16 NOTE — Telephone Encounter (Signed)
Pt called checking on her recent lab results. I gave her MD response. She expressed understanding and did not have any questions at this time.

## 2016-12-19 DIAGNOSIS — R69 Illness, unspecified: Secondary | ICD-10-CM | POA: Diagnosis not present

## 2016-12-19 NOTE — Telephone Encounter (Signed)
Pt called and has some questions regarding her lab work , she states she is not feeling good but did not give details just wants a call back.

## 2016-12-19 NOTE — Telephone Encounter (Signed)
Patient states that she is still very weak and cannot walk up the stares without having to stop and catch her breath that people are starting to notice. She wants to know if maybe she needs a referral to pulmonary or cardiology.

## 2016-12-21 ENCOUNTER — Other Ambulatory Visit (INDEPENDENT_AMBULATORY_CARE_PROVIDER_SITE_OTHER): Payer: Medicare HMO

## 2016-12-21 DIAGNOSIS — R0600 Dyspnea, unspecified: Secondary | ICD-10-CM

## 2016-12-21 LAB — BRAIN NATRIURETIC PEPTIDE: Pro B Natriuretic peptide (BNP): 26 pg/mL (ref 0.0–100.0)

## 2016-12-21 NOTE — Telephone Encounter (Signed)
Have put in additional lab for her to come get drawn to check for signs of heart failure causing shortness of breath. If that is normal she does not need cardiology. We can refer to pulmonary for her as well.

## 2016-12-21 NOTE — Telephone Encounter (Signed)
Contacted patient and informed them of additional labs sent in and the referral to pulmonology. Patient states it is a jewish holiday today and will come tomorrow to get labs drawn.

## 2016-12-26 ENCOUNTER — Ambulatory Visit (INDEPENDENT_AMBULATORY_CARE_PROVIDER_SITE_OTHER): Payer: Medicare HMO | Admitting: Internal Medicine

## 2016-12-26 ENCOUNTER — Ambulatory Visit (INDEPENDENT_AMBULATORY_CARE_PROVIDER_SITE_OTHER)
Admission: RE | Admit: 2016-12-26 | Discharge: 2016-12-26 | Disposition: A | Discharge status: Home / Self Care | Payer: Medicare HMO | Source: Ambulatory Visit | Attending: Internal Medicine | Admitting: Internal Medicine

## 2016-12-26 ENCOUNTER — Encounter: Payer: Self-pay | Admitting: Internal Medicine

## 2016-12-26 VITALS — BP 112/70 | HR 72 | Ht 65.0 in | Wt 181.0 lb

## 2016-12-26 DIAGNOSIS — R918 Other nonspecific abnormal finding of lung field: Secondary | ICD-10-CM | POA: Diagnosis not present

## 2016-12-26 DIAGNOSIS — R0602 Shortness of breath: Secondary | ICD-10-CM | POA: Diagnosis not present

## 2016-12-26 DIAGNOSIS — J449 Chronic obstructive pulmonary disease, unspecified: Secondary | ICD-10-CM | POA: Diagnosis not present

## 2016-12-26 DIAGNOSIS — R05 Cough: Secondary | ICD-10-CM | POA: Diagnosis not present

## 2016-12-26 DIAGNOSIS — J441 Chronic obstructive pulmonary disease with (acute) exacerbation: Secondary | ICD-10-CM

## 2016-12-26 DIAGNOSIS — Z23 Encounter for immunization: Secondary | ICD-10-CM

## 2016-12-26 DIAGNOSIS — R0609 Other forms of dyspnea: Secondary | ICD-10-CM | POA: Insufficient documentation

## 2016-12-26 DIAGNOSIS — R0600 Dyspnea, unspecified: Secondary | ICD-10-CM | POA: Insufficient documentation

## 2016-12-26 MED ORDER — MOMETASONE FURO-FORMOTEROL FUM 100-5 MCG/ACT IN AERO
2.0000 | INHALATION_SPRAY | Freq: Two times a day (BID) | RESPIRATORY_TRACT | 11 refills | Status: DC
Start: 2016-12-26 — End: 2017-07-28

## 2016-12-26 MED ORDER — MOMETASONE FURO-FORMOTEROL FUM 100-5 MCG/ACT IN AERO: 2.0000 | INHALATION_SPRAY | Freq: Two times a day (BID) | RESPIRATORY_TRACT | 0 refills | Status: DC

## 2016-12-26 NOTE — Patient Instructions (Addendum)
Try dulera 100 Take x 1  puffs first thing in am and then another 1puffs about 12 hours later and  If not better then 2 puffs every 12 hours and fill the Rx if improve - in not>> don't fill it  GERD (REFLUX)  is an extremely common cause of respiratory symptoms just like yours , many times with no obvious heartburn at all.    It can be treated with medication, but also with lifestyle changes including elevation of the head of your bed (ideally with 6 inch  bed blocks),  Smoking cessation, avoidance of late meals, excessive alcohol, and avoid fatty foods, chocolate, peppermint, colas, red wine, and acidic juices such as orange juice.  NO MINT OR MENTHOL PRODUCTS SO NO COUGH DROPS   USE SUGARLESS CANDY INSTEAD (Jolley ranchers or Stover's or Life Savers) or even ice chips will also do - the key is to swallow to prevent all throat clearing. NO OIL BASED VITAMINS - use powdered substitutes - Stop fish oil and eat more fish  Please remember to go to the  x-ray department downstairs in the basement  for your tests - we will call you with the results when they are available.     Please schedule a follow up office visit in 6 weeks, call sooner if needed

## 2016-12-26 NOTE — Progress Notes (Signed)
Subjective:     Patient ID: Crystal Duarte, female   DOB: 1960/10/05,   MRN: [id number]  HPI  48 yowf former smoker quit June 2018  born in [place] with onset of "sinus problem" on arrival stuffy nose more in spring better on [place] worse since spring / summer 2018 then gradually worse sob around July 2018 assoc with chest tightness and dry cough assoc with wt loss from baseline 200  referred to pulmonary clinic 12/26/2016 by Dr   Sharlet Salina with prob GOLD II copd on initial eval   S/p lung bx at [facility] c/w "[place] fungus around 2001" presumably cocci   12/26/2016 1st [facility] office visit/ Samona Chihuahua   Chief Complaint  Patient presents with  . Pulmonary Consult    Referred by Dr. Pricilla Holm. Pt c/o DOE x 2 months- gets winded walking up stairs.  She also c/o non prod cough.   sob vacuuming / doe x MMRC2 = can't walk a nl pace on a flat grade s sob but does fine slow and flat eg shopping  Sleeping ok s cough / cough is dry daytime > noct  No obvious day to day or daytime variability or assoc excess/ purulent sputum or mucus plugs or hemoptysis or cp or chest tightness, subjective wheeze or overt   hb symptoms. No unusual exp hx or h/o childhood pna/ asthma or knowledge of premature birth.  Sleeping ok flat without nocturnal  or early am exacerbation  of respiratory  c/o's or need for noct saba. Also denies any obvious fluctuation of symptoms with weather or environmental changes or other aggravating or alleviating factors except as outlined above   Current Allergies, Complete Past Medical History, Past Surgical History, Family History, and Social History were reviewed in [employer] record.  ROS  The following are not active complaints unless bolded sore throat, dysphagia, dental problems, itching, sneezing,  nasal congestion or disharge of excess mucus or purulent secretions, ear ache Left side only ,   fever,  chills, sweats, unintended wt loss or wt gain, classically pleuritic or exertional cp,  orthopnea pnd or leg swelling, presyncope, palpitations, abdominal pain, anorexia, nausea, vomiting, diarrhea  or change in bowel habits or bladder habits, change in stools or change in urine, dysuria, hematuria,  rash, arthralgias, visual complaints, headache, numbness, weakness or ataxia or problems with walking or coordination,  change in mood/affect or memory.        Current Meds  Medication Sig  . buPROPion (WELLBUTRIN SR) 100 MG 12 hr tablet Take 1 tablet by mouth daily.  . Calcium-Vitamin D (CALTRATE 600 PLUS-VIT D PO) Take 600 mg by mouth.  . Cholecalciferol (VITAMIN D3 PO) Take 1,200 mg by mouth daily.  . clonazePAM (KLONOPIN) 1 MG tablet Take 0.5 mg by mouth 3 (three) times daily as needed for anxiety.   . DULoxetine (CYMBALTA) 60 MG capsule Take 60 mg by mouth daily.  . fluticasone (FLONASE) 50 MCG/ACT nasal spray Place 2 sprays into both nostrils daily.  . folic acid (FOLVITE) 1 MG tablet Take 1 mg by mouth daily.  Marland Kitchen ibuprofen (ADVIL,MOTRIN) 200 MG tablet Take 800 mg by mouth every 8 (eight) hours as needed for fever or moderate pain. Reported on 10/08/2015  . Multiple Vitamin (MULTIVITAMIN) tablet Take 1 tablet by mouth daily. Reported on 10/08/2015  . traZODone (DESYREL) 100 MG tablet Take 2 tablets by mouth daily.  . [ Omega-3 Fatty Acids (FISH OIL)  1200 MG CAPS Take 3 capsules by mouth daily.               Review of Systems     Objective:   Physical Exam    somber amb wf nad  Wt Readings from Last 3 Encounters:  12/26/16 181 lb (82.1 kg)  12/14/16 178 lb (80.7 kg)  10/13/16 199 lb (90.3 kg)    Vital signs reviewed - Note on arrival 02 sats  97% on RA     HEENT: nl dentition, turbinates bilaterally, and oropharynx. Nl external ear canals without cough reflex   NECK :  without JVD/Nodes/TM/ nl carotid upstrokes bilaterally   LUNGS: no acc muscle use,  Nl contour chest  which is clear to A and P bilaterally with  cough on insp  maneuvers   CV:  RRR  no s3 or murmur or increase in P2, and no edema   ABD:  soft and nontender with nl inspiratory excursion in the supine position. No bruits or organomegaly appreciated, bowel sounds nl  MS:  Nl gait/ ext warm without deformities, calf tenderness, cyanosis or clubbing No obvious joint restrictions   SKIN: warm and dry without lesions    NEURO:  alert, approp, nl sensorium with  no motor or cerebellar deficits apparent.    CXR PA and Lateral:   12/26/2016 :    I personally reviewed images and   impression as follows: C/w mild copd/cb   Labs 12/21/16  Nl bnp/ cbc, nl [id number]   Lab Results  Component Value Date   TSH 1.71 12/14/2016       Assessment:

## 2016-12-27 ENCOUNTER — Encounter: Payer: Self-pay | Admitting: Internal Medicine

## 2016-12-27 ENCOUNTER — Telehealth: Payer: Self-pay | Admitting: Internal Medicine

## 2016-12-27 NOTE — Progress Notes (Signed)
LMTCB

## 2016-12-27 NOTE — Assessment & Plan Note (Signed)
12/26/2016  Walked RA x 3 laps @ 185 ft each stopped due to  End of study, nl pace, no   Desat/ min sob    No evidence of chf/ thyroid  dz or anemia > see copd GOLD II though not clear that airflow is really the limiting issue and will bring back in 6 weeks to reassess resp to rx and sort out then ? Needs cpst or further cards w/u

## 2016-12-27 NOTE — Telephone Encounter (Signed)
Notes recorded by Tanda Rockers, MD on 12/27/2016 at 8:56 AM EDT Call pt: Reviewed cxr and no acute change so no change in recommendations made at [facility]  Advised pt of CXR results. Pt understood and nothing else is needed.

## 2016-12-27 NOTE — Assessment & Plan Note (Signed)
H/o coccidiomycosis (lived in [place], [place] bx at [facility] in 2001 per pt)  No evidence of recurrence on today's cxr

## 2016-12-27 NOTE — Assessment & Plan Note (Signed)
Quit smoking 09/2016 with worsening sob since 10/2016   Spirometry 12/26/2016  FEV1 1.63 (60%)  Ratio 70 with curvature,  Texp < 6 sec  - 12/26/2016  After extensive coaching HFA effectiveness =    75% so try dulera 100 one bid as sensitive to SABA   When respiratory symptoms begin or become refractory   after a patient reports complete smoking cessation,  Especially when this wasn't the case while they were smoking, a red flag is raised based on the work of Dr Kris Mouton which states:  if you quit smoking when your best day FEV1 is still well preserved it is highly unlikely you will progress to severe disease.  That is to say, once the smoking stops,  the symptoms should not suddenly erupt or markedly worsen.  If so, the differential diagnosis should include  obesity/deconditioning,  LPR/Reflux/Aspiration syndromes,  occult CHF, or  especially side effect of medications commonly used in this population.  In her case she has actually lost wt and no evidence of chf or adverse drug effect but suspect may have element of anxiety /depression based on how well she did on her walk her - see doe  Nevertheless reasonable to try low dose dulera x 6 weeks then return for repeat pfts   Total time devoted to counseling  > 50 % of initial 60 min office visit:  review case with pt/ discussion of options/alternatives/ personally creating written customized instructions  in presence of pt  then going over those specific  Instructions directly with the pt including how to use all of the meds but in particular covering each new medication in detail and the difference between the maintenance= "automatic" meds and the prns using an action plan format for the latter (If this problem/symptom => do that organization reading Left to right).  Please see AVS from this visit for a full list of these instructions which I personally wrote for this pt and  are unique to this visit.

## 2017-01-02 ENCOUNTER — Encounter: Payer: Self-pay | Admitting: Internal Medicine

## 2017-01-16 DIAGNOSIS — F339 Major depressive disorder, recurrent, unspecified: Secondary | ICD-10-CM | POA: Diagnosis not present

## 2017-01-16 DIAGNOSIS — R69 Illness, unspecified: Secondary | ICD-10-CM | POA: Diagnosis not present

## 2017-01-30 DIAGNOSIS — R69 Illness, unspecified: Secondary | ICD-10-CM | POA: Diagnosis not present

## 2017-01-31 DIAGNOSIS — R69 Illness, unspecified: Secondary | ICD-10-CM | POA: Diagnosis not present

## 2017-02-06 ENCOUNTER — Ambulatory Visit: Payer: Self-pay | Admitting: Internal Medicine

## 2017-02-07 DIAGNOSIS — R69 Illness, unspecified: Secondary | ICD-10-CM | POA: Diagnosis not present

## 2017-02-16 DIAGNOSIS — R69 Illness, unspecified: Secondary | ICD-10-CM | POA: Diagnosis not present

## 2017-02-20 DIAGNOSIS — R69 Illness, unspecified: Secondary | ICD-10-CM | POA: Diagnosis not present

## 2017-03-08 DIAGNOSIS — J320 Chronic maxillary sinusitis: Secondary | ICD-10-CM | POA: Diagnosis not present

## 2017-03-08 DIAGNOSIS — J029 Acute pharyngitis, unspecified: Secondary | ICD-10-CM | POA: Diagnosis not present

## 2017-03-08 DIAGNOSIS — H6983 Other specified disorders of Eustachian tube, bilateral: Secondary | ICD-10-CM | POA: Diagnosis not present

## 2017-03-08 DIAGNOSIS — R05 Cough: Secondary | ICD-10-CM | POA: Diagnosis not present

## 2017-03-09 DIAGNOSIS — R69 Illness, unspecified: Secondary | ICD-10-CM | POA: Diagnosis not present

## 2017-04-10 DIAGNOSIS — H52203 Unspecified astigmatism, bilateral: Secondary | ICD-10-CM | POA: Diagnosis not present

## 2017-04-10 DIAGNOSIS — H5203 Hypermetropia, bilateral: Secondary | ICD-10-CM | POA: Diagnosis not present

## 2017-04-10 DIAGNOSIS — H524 Presbyopia: Secondary | ICD-10-CM | POA: Diagnosis not present

## 2017-04-11 DIAGNOSIS — R69 Illness, unspecified: Secondary | ICD-10-CM | POA: Diagnosis not present

## 2017-05-01 DIAGNOSIS — R69 Illness, unspecified: Secondary | ICD-10-CM | POA: Diagnosis not present

## 2017-05-08 DIAGNOSIS — F411 Generalized anxiety disorder: Secondary | ICD-10-CM | POA: Diagnosis not present

## 2017-05-08 DIAGNOSIS — R69 Illness, unspecified: Secondary | ICD-10-CM | POA: Diagnosis not present

## 2017-05-10 DIAGNOSIS — M545 Low back pain: Secondary | ICD-10-CM | POA: Diagnosis not present

## 2017-05-15 DIAGNOSIS — R69 Illness, unspecified: Secondary | ICD-10-CM | POA: Diagnosis not present

## 2017-05-22 DIAGNOSIS — R531 Weakness: Secondary | ICD-10-CM | POA: Diagnosis not present

## 2017-05-22 DIAGNOSIS — F332 Major depressive disorder, recurrent severe without psychotic features: Secondary | ICD-10-CM | POA: Insufficient documentation

## 2017-05-22 DIAGNOSIS — R45851 Suicidal ideations: Secondary | ICD-10-CM | POA: Diagnosis not present

## 2017-05-22 DIAGNOSIS — Z87891 Personal history of nicotine dependence: Secondary | ICD-10-CM | POA: Diagnosis not present

## 2017-05-22 DIAGNOSIS — E559 Vitamin D deficiency, unspecified: Secondary | ICD-10-CM | POA: Diagnosis not present

## 2017-05-22 DIAGNOSIS — R69 Illness, unspecified: Secondary | ICD-10-CM | POA: Diagnosis not present

## 2017-05-22 DIAGNOSIS — R918 Other nonspecific abnormal finding of lung field: Secondary | ICD-10-CM | POA: Diagnosis not present

## 2017-05-22 DIAGNOSIS — Z888 Allergy status to other drugs, medicaments and biological substances status: Secondary | ICD-10-CM | POA: Diagnosis not present

## 2017-05-22 DIAGNOSIS — Z79899 Other long term (current) drug therapy: Secondary | ICD-10-CM | POA: Diagnosis not present

## 2017-05-22 DIAGNOSIS — M549 Dorsalgia, unspecified: Secondary | ICD-10-CM | POA: Diagnosis not present

## 2017-05-22 DIAGNOSIS — F419 Anxiety disorder, unspecified: Secondary | ICD-10-CM | POA: Diagnosis not present

## 2017-05-22 DIAGNOSIS — T424X2D Poisoning by benzodiazepines, intentional self-harm, subsequent encounter: Secondary | ICD-10-CM | POA: Diagnosis not present

## 2017-05-22 DIAGNOSIS — Z882 Allergy status to sulfonamides status: Secondary | ICD-10-CM | POA: Diagnosis not present

## 2017-05-22 DIAGNOSIS — J309 Allergic rhinitis, unspecified: Secondary | ICD-10-CM | POA: Diagnosis not present

## 2017-05-23 DIAGNOSIS — E559 Vitamin D deficiency, unspecified: Secondary | ICD-10-CM | POA: Insufficient documentation

## 2017-05-23 DIAGNOSIS — J309 Allergic rhinitis, unspecified: Secondary | ICD-10-CM | POA: Insufficient documentation

## 2017-05-24 DIAGNOSIS — J309 Allergic rhinitis, unspecified: Secondary | ICD-10-CM | POA: Diagnosis not present

## 2017-05-24 DIAGNOSIS — M549 Dorsalgia, unspecified: Secondary | ICD-10-CM | POA: Diagnosis not present

## 2017-05-24 DIAGNOSIS — E559 Vitamin D deficiency, unspecified: Secondary | ICD-10-CM | POA: Diagnosis not present

## 2017-05-25 DIAGNOSIS — J309 Allergic rhinitis, unspecified: Secondary | ICD-10-CM | POA: Diagnosis not present

## 2017-05-25 DIAGNOSIS — E559 Vitamin D deficiency, unspecified: Secondary | ICD-10-CM | POA: Diagnosis not present

## 2017-05-25 DIAGNOSIS — M549 Dorsalgia, unspecified: Secondary | ICD-10-CM | POA: Diagnosis not present

## 2017-05-29 DIAGNOSIS — J309 Allergic rhinitis, unspecified: Secondary | ICD-10-CM | POA: Diagnosis not present

## 2017-05-29 DIAGNOSIS — M549 Dorsalgia, unspecified: Secondary | ICD-10-CM | POA: Diagnosis not present

## 2017-05-29 DIAGNOSIS — E559 Vitamin D deficiency, unspecified: Secondary | ICD-10-CM | POA: Diagnosis not present

## 2017-06-12 DIAGNOSIS — R69 Illness, unspecified: Secondary | ICD-10-CM | POA: Diagnosis not present

## 2017-06-12 DIAGNOSIS — F411 Generalized anxiety disorder: Secondary | ICD-10-CM | POA: Diagnosis not present

## 2017-06-28 DIAGNOSIS — R69 Illness, unspecified: Secondary | ICD-10-CM | POA: Diagnosis not present

## 2017-07-03 DIAGNOSIS — R69 Illness, unspecified: Secondary | ICD-10-CM | POA: Diagnosis not present

## 2017-07-03 DIAGNOSIS — F411 Generalized anxiety disorder: Secondary | ICD-10-CM | POA: Diagnosis not present

## 2017-07-04 DIAGNOSIS — R451 Restlessness and agitation: Secondary | ICD-10-CM | POA: Diagnosis not present

## 2017-07-04 DIAGNOSIS — Z915 Personal history of self-harm: Secondary | ICD-10-CM | POA: Diagnosis not present

## 2017-07-04 DIAGNOSIS — E559 Vitamin D deficiency, unspecified: Secondary | ICD-10-CM | POA: Diagnosis not present

## 2017-07-04 DIAGNOSIS — Z79899 Other long term (current) drug therapy: Secondary | ICD-10-CM | POA: Diagnosis not present

## 2017-07-04 DIAGNOSIS — Z888 Allergy status to other drugs, medicaments and biological substances status: Secondary | ICD-10-CM | POA: Diagnosis not present

## 2017-07-04 DIAGNOSIS — F319 Bipolar disorder, unspecified: Secondary | ICD-10-CM | POA: Diagnosis not present

## 2017-07-04 DIAGNOSIS — R69 Illness, unspecified: Secondary | ICD-10-CM | POA: Diagnosis not present

## 2017-07-04 DIAGNOSIS — J309 Allergic rhinitis, unspecified: Secondary | ICD-10-CM | POA: Diagnosis not present

## 2017-07-04 DIAGNOSIS — J449 Chronic obstructive pulmonary disease, unspecified: Secondary | ICD-10-CM | POA: Diagnosis not present

## 2017-07-04 DIAGNOSIS — M549 Dorsalgia, unspecified: Secondary | ICD-10-CM | POA: Diagnosis not present

## 2017-07-04 DIAGNOSIS — Z1339 Encounter for screening examination for other mental health and behavioral disorders: Secondary | ICD-10-CM | POA: Diagnosis not present

## 2017-07-04 DIAGNOSIS — R05 Cough: Secondary | ICD-10-CM | POA: Diagnosis not present

## 2017-07-04 DIAGNOSIS — X838XXD Intentional self-harm by other specified means, subsequent encounter: Secondary | ICD-10-CM | POA: Diagnosis not present

## 2017-07-04 DIAGNOSIS — R918 Other nonspecific abnormal finding of lung field: Secondary | ICD-10-CM | POA: Diagnosis not present

## 2017-07-04 DIAGNOSIS — G8929 Other chronic pain: Secondary | ICD-10-CM | POA: Diagnosis not present

## 2017-07-04 DIAGNOSIS — Z882 Allergy status to sulfonamides status: Secondary | ICD-10-CM | POA: Diagnosis not present

## 2017-07-08 DIAGNOSIS — R05 Cough: Secondary | ICD-10-CM | POA: Diagnosis not present

## 2017-07-08 DIAGNOSIS — R918 Other nonspecific abnormal finding of lung field: Secondary | ICD-10-CM | POA: Diagnosis not present

## 2017-07-17 ENCOUNTER — Encounter (HOSPITAL_COMMUNITY): Payer: Self-pay | Admitting: Emergency Medicine

## 2017-07-17 ENCOUNTER — Emergency Department (HOSPITAL_COMMUNITY): Payer: Medicare HMO

## 2017-07-17 ENCOUNTER — Emergency Department (HOSPITAL_COMMUNITY)
Admission: EM | Admit: 2017-07-17 | Discharge: 2017-07-18 | Disposition: A | Discharge status: Other Acute Inpatient Hospital | Payer: Medicare HMO | Attending: Emergency Medicine | Admitting: Emergency Medicine

## 2017-07-17 DIAGNOSIS — F319 Bipolar disorder, unspecified: Secondary | ICD-10-CM | POA: Diagnosis present

## 2017-07-17 DIAGNOSIS — R69 Illness, unspecified: Secondary | ICD-10-CM | POA: Diagnosis not present

## 2017-07-17 DIAGNOSIS — R450 Nervousness: Secondary | ICD-10-CM | POA: Diagnosis not present

## 2017-07-17 DIAGNOSIS — Z87891 Personal history of nicotine dependence: Secondary | ICD-10-CM | POA: Diagnosis not present

## 2017-07-17 DIAGNOSIS — F13232 Sedative, hypnotic or anxiolytic dependence with withdrawal with perceptual disturbance: Secondary | ICD-10-CM | POA: Insufficient documentation

## 2017-07-17 DIAGNOSIS — H9202 Otalgia, left ear: Secondary | ICD-10-CM | POA: Insufficient documentation

## 2017-07-17 DIAGNOSIS — F419 Anxiety disorder, unspecified: Secondary | ICD-10-CM | POA: Diagnosis not present

## 2017-07-17 DIAGNOSIS — F430 Acute stress reaction: Secondary | ICD-10-CM | POA: Diagnosis not present

## 2017-07-17 DIAGNOSIS — R45851 Suicidal ideations: Secondary | ICD-10-CM | POA: Diagnosis not present

## 2017-07-17 DIAGNOSIS — F332 Major depressive disorder, recurrent severe without psychotic features: Secondary | ICD-10-CM | POA: Diagnosis not present

## 2017-07-17 DIAGNOSIS — Z79899 Other long term (current) drug therapy: Secondary | ICD-10-CM | POA: Diagnosis not present

## 2017-07-17 DIAGNOSIS — R51 Headache: Secondary | ICD-10-CM | POA: Insufficient documentation

## 2017-07-17 DIAGNOSIS — R519 Headache, unspecified: Secondary | ICD-10-CM

## 2017-07-17 DIAGNOSIS — J449 Chronic obstructive pulmonary disease, unspecified: Secondary | ICD-10-CM | POA: Insufficient documentation

## 2017-07-17 DIAGNOSIS — M542 Cervicalgia: Secondary | ICD-10-CM | POA: Diagnosis not present

## 2017-07-17 DIAGNOSIS — M62838 Other muscle spasm: Secondary | ICD-10-CM | POA: Diagnosis not present

## 2017-07-17 DIAGNOSIS — R42 Dizziness and giddiness: Secondary | ICD-10-CM | POA: Diagnosis not present

## 2017-07-17 DIAGNOSIS — R200 Anesthesia of skin: Secondary | ICD-10-CM | POA: Diagnosis not present

## 2017-07-17 DIAGNOSIS — F310 Bipolar disorder, current episode hypomanic: Secondary | ICD-10-CM | POA: Diagnosis not present

## 2017-07-17 LAB — COMPREHENSIVE METABOLIC PANEL
ALT: 16 U/L (ref 14–54)
AST: 16 U/L (ref 15–41)
Albumin: 4.1 g/dL (ref 3.5–5.0)
Alkaline Phosphatase: 77 U/L (ref 38–126)
Anion gap: 10 (ref 5–15)
BUN: 10 mg/dL (ref 6–20)
CO2: 25 mmol/L (ref 22–32)
Calcium: 9.6 mg/dL (ref 8.9–10.3)
Chloride: 104 mmol/L (ref 101–111)
Creatinine, Ser: 0.87 mg/dL (ref 0.44–1.00)
GFR calc Af Amer: >60 mL/min (ref >60)
GFR calc non Af Amer: >60 mL/min (ref >60)
Glucose, Bld: 101 mg/dL — ABNORMAL HIGH (ref 65–99)
Potassium: 4.4 mmol/L (ref 3.5–5.1)
Sodium: 139 mmol/L (ref 135–145)
Total Bilirubin: 1.1 mg/dL (ref 0.3–1.2)
Total Protein: 7.2 g/dL (ref 6.5–8.1)

## 2017-07-17 LAB — ACETAMINOPHEN LEVEL: Acetaminophen (Tylenol), Serum: <10 ug/mL — ABNORMAL LOW (ref 10–30)

## 2017-07-17 LAB — CBC
HCT: 39.2 % (ref 36.0–46.0)
Hemoglobin: 13.3 g/dL (ref 12.0–15.0)
MCH: 31.7 pg (ref 26.0–34.0)
MCHC: 33.9 g/dL (ref 30.0–36.0)
MCV: 93.3 fL (ref 78.0–100.0)
Platelets: 317 10*3/uL (ref 150–400)
RBC: 4.2 MIL/uL (ref 3.87–5.11)
RDW: 12.9 % (ref 11.5–15.5)
WBC: 12.7 10*3/uL — ABNORMAL HIGH (ref 4.0–10.5)

## 2017-07-17 LAB — URINALYSIS, ROUTINE W REFLEX MICROSCOPIC
Bilirubin Urine: NEGATIVE
Glucose, UA: NEGATIVE mg/dL
Hgb urine dipstick: NEGATIVE
Ketones, ur: NEGATIVE mg/dL
Leukocytes, UA: NEGATIVE
Nitrite: NEGATIVE
Protein, ur: NEGATIVE mg/dL
Specific Gravity, Urine: 1.008 (ref 1.005–1.030)
pH: 7 (ref 5.0–8.0)

## 2017-07-17 LAB — RAPID URINE DRUG SCREEN, HOSP PERFORMED
Amphetamines: NOT DETECTED
Barbiturates: NOT DETECTED
Benzodiazepines: NOT DETECTED
Cocaine: NOT DETECTED
Opiates: NOT DETECTED
Tetrahydrocannabinol: NOT DETECTED

## 2017-07-17 LAB — SALICYLATE LEVEL: Salicylate Lvl: <7 mg/dL (ref 2.8–30.0)

## 2017-07-17 LAB — ETHANOL: Alcohol, Ethyl (B): <10 mg/dL (ref <10)

## 2017-07-17 MED ORDER — ACETAMINOPHEN 325 MG PO TABS
650.0000 mg | ORAL_TABLET | Freq: Once | ORAL | Status: AC
  Administered 2017-07-17: 650 mg via ORAL
  Filled 2017-07-17: qty 2

## 2017-07-17 MED ORDER — BUSPIRONE HCL 10 MG PO TABS
15.0000 mg | ORAL_TABLET | Freq: Three times a day (TID) | ORAL | Status: DC
  Administered 2017-07-17: 15 mg via ORAL
  Administered 2017-07-18: 16:00:00 via ORAL
  Administered 2017-07-18: 15 mg via ORAL
  Filled 2017-07-17 (×3): qty 2

## 2017-07-17 MED ORDER — LORAZEPAM 1 MG PO TABS
1.0000 mg | ORAL_TABLET | Freq: Once | ORAL | Status: AC
  Administered 2017-07-17: 1 mg via ORAL
  Filled 2017-07-17: qty 1

## 2017-07-17 MED ORDER — QUETIAPINE FUMARATE 50 MG PO TABS
50.0000 mg | ORAL_TABLET | Freq: Every day | ORAL | Status: DC
  Administered 2017-07-17: 50 mg via ORAL
  Filled 2017-07-17: qty 1

## 2017-07-17 MED ORDER — TRAZODONE HCL 100 MG PO TABS
200.0000 mg | ORAL_TABLET | Freq: Every day | ORAL | Status: DC
  Administered 2017-07-17: 200 mg via ORAL
  Filled 2017-07-17: qty 2

## 2017-07-17 MED ORDER — HYDROXYZINE PAMOATE 25 MG PO CAPS
25.0000 mg | ORAL_CAPSULE | Freq: Four times a day (QID) | ORAL | Status: DC | PRN
  Administered 2017-07-18: 25 mg via ORAL
  Filled 2017-07-17: qty 1

## 2017-07-17 MED ORDER — ACETAMINOPHEN 325 MG PO TABS: 650.0000 mg | ORAL_TABLET | ORAL | Status: DC | PRN

## 2017-07-17 MED ORDER — DULOXETINE HCL 30 MG PO CPEP
90.0000 mg | ORAL_CAPSULE | Freq: Every day | ORAL | Status: DC
  Administered 2017-07-18: 90 mg via ORAL
  Filled 2017-07-17 (×2): qty 3

## 2017-07-17 NOTE — ED Notes (Signed)
Stuck pt and was not successful.  RN will get labs.

## 2017-07-17 NOTE — ED Triage Notes (Addendum)
Patient here from home with complaints of withdrawal from Klonopin 1mg  3 times a day for 4 year. Dr Lenna Sciara psychiatrist. Headache, AV hallucinations, spasms, nightmares. States that she went to [place] on Monday and they started weaning her off Klonopin, Thursday was the last day of Klonopin.   SI with plan to "end it all".

## 2017-07-17 NOTE — ED Notes (Signed)
Pt move to room #36. Pt denies SI/HI/AVH, reports she is here d/t Klonopin withdrawal. Pt c/o being hungry, reports has not ate today. Encouragement and support provided. Sandwich and snacks provided. Pt oriented to unit, Special checks q 15 mins in place for safety, Video monitoring in place. Will continue to monitor.

## 2017-07-17 NOTE — ED Provider Notes (Signed)
[facility] DEPT Provider Note   CSN: [id number] Arrival date & time: 07/17/17  1158     History   Chief Complaint Chief Complaint  Patient presents with  . Withdrawal  . Suicidal    HPI Crystal Duarte is a 57 y.o. female.  HPI  Patient is a 57 year old female with a history of depression, bipolar disorder, osteopenia presenting for concerns that she is withdrawing from [place].  Patient reports that she is presenting after discussion with her personal psychiatrist, Dr. Lenna Sciara in [place].  Patient reports that she was on Klonopin 1 mg 3 times daily up to approximately 1.5 weeks ago, and her insurance will no longer cover it and her psychiatrist wanted her to wean off of it.  Patient requested inpatient evaluation due to having suicidal ideations, and concern about coming off of Klonopin.  Patient was hospitalized from 4/2-4/11, and last dose of Klonopin was Thursday.  Patient reports that she does have a supply that is held by her children.  Patient reports symptoms began with diffuse myalgias, right-sided neck spasming, paresthesias of right hand and bilateral lower feet.  Patient reports that she felt that she was "shaking".  Patient denies having a post ictal episode after any episode of muscle spasming or shaking.  Patient reports she has had bilateral blurred vision, that seems worse than her normal vision.  Patient denies focal or one-sided weakness, but reports she has been diffusely weak.  Patient denies fevers or chills, coughing, chest pain, shortness of breath, abdominal pain, nausea, or vomiting.  Patient reports that she was so disturbed by her symptoms yesterday, that she had thoughts that she wanted to "end it all".  Patient denies any clear plan for suicidal ideation.  Patient also reporting that she had an episode over the weekend where she thought that she heard voices that were not there, but no command hallucinations.  No visual hallucinations.   Patient also reporting vivid dreams.   Past Medical History:  Diagnosis Date  . Coccidioidomycosis, pulmonary ([place])   . Depression   . Hx of adenomatous polyp of colon 12/04/2014  . Osteopenia 11/2016   T score -2.2 FRAX 17%/2.4%  . Panic attack     Patient Active Problem List   Diagnosis Date Noted  . DOE (dyspnea on exertion) 12/26/2016  . COPD GOLD II  12/26/2016  . Orthostatic hypotension 12/14/2016  . Chest tightness 12/14/2016  . Weight loss 12/14/2016  . Routine general medical examination at a health care facility 10/09/2015  . H/O vaginal dysplasia 09/15/2015  . Hx of adenomatous polyp of colon 12/04/2014  . Memory loss 10/04/2014  . Affective bipolar disorder ([place]) 03/12/2014  . Peripheral vascular disease, unspecified ([place]) 10/10/2013  . Smoker 08/28/2012  . Menopause 08/28/2012  . Multiple pulmonary nodules 11/12/2010    Past Surgical History:  Procedure Laterality Date  . ABDOMINAL HYSTERECTOMY    . COLONOSCOPY  2001   hemorrhoidectomy  . LUNG SURGERY  2001   VATS surgery   . OVARIAN CYST REMOVAL  1970's  . VESICOVAGINAL FISTULA CLOSURE W/ TAH  2005     OB History    Gravida  2   Para  2   Term      Preterm      AB      Living  2     SAB      TAB      Ectopic      Multiple  Live Births               Home Medications    Prior to Admission medications   Medication Sig Start Date End Date Taking? Authorizing Provider  acetaminophen (TYLENOL) 500 MG tablet Take 1,000 mg by mouth every 6 (six) hours as needed for headache.   Yes [provider]  busPIRone (BUSPAR) 15 MG tablet Take 15 mg by mouth 3 (three) times daily. 07/13/17 07/13/18 Yes [provider]  Calcium-Vitamin D (CALTRATE 600 PLUS-VIT D PO) Take 600 mg by mouth.   Yes [provider]  cetirizine (ZYRTEC) 10 MG tablet Take 10 mg by mouth as needed for allergies.   Yes [provider]  Cholecalciferol (VITAMIN D3 PO) Take 1,200 mg by  mouth daily.   Yes [provider]  DULoxetine (CYMBALTA) 30 MG capsule Take 90 mg by mouth daily. Pt takes a total dose of 90mg  of duloxetine. Either by taking three 30mg  or one 60mg  and one 30mg  capsule.   Yes [provider]  fluticasone (FLONASE) 50 MCG/ACT nasal spray Place 2 sprays into both nostrils daily. 10/08/15  Yes [name], MD  folic acid (FOLVITE) 1 MG tablet Take 1 mg by mouth daily.   Yes [provider]  hydrOXYzine (VISTARIL) 25 MG capsule Take 25 mg by mouth as needed. 07/13/17  Yes [provider]  mometasone-formoterol (DULERA) 100-5 MCG/ACT AERO Inhale 2 puffs into the lungs 2 (two) times daily. 12/26/16  Yes Tanda Rockers, MD  Multiple Vitamin (MULTIVITAMIN) tablet Take 1 tablet by mouth daily. Reported on 10/08/2015   Yes [provider]  naphazoline-pheniramine (NAPHCON-A) 0.025-0.3 % ophthalmic solution Place 2 drops into both eyes 4 (four) times daily as needed for eye irritation.   Yes [provider]  QUEtiapine (SEROQUEL) 50 MG tablet Take 100 mg by mouth at bedtime. 07/13/17  Yes [provider]  traZODone (DESYREL) 100 MG tablet Take 200 mg by mouth at bedtime.  10/11/16  Yes [provider]  clonazePAM (KLONOPIN) 1 MG tablet Take 1 mg by mouth 3 (three) times daily as needed for anxiety.     [provider]    Family History Family History  Problem Relation Age of Onset  . Asthma Mother   . Ovarian cancer Mother   . Cancer Mother        OVARIAN  . Varicose Veins Mother   . Heart disease Father   . Peripheral vascular disease Father   . Cancer Maternal Grandmother        LUNG- LUNG  . Colon cancer Neg Hx     Social History Social History   Tobacco Use  . Smoking status: Former Smoker    Packs/day: 1.00    Years: 42.00    Pack years: 42.00    Types: Cigarettes    Last attempt to quit: 09/25/2016    Years since quitting: 0.8  . Smokeless tobacco: Former Systems developer     Quit date: 06/13/2016  Substance Use Topics  . Alcohol use: No    Alcohol/week: 0.0 oz  . Drug use: No     Allergies   Latuda [lurasidone hcl] and Sulfa antibiotics   Review of Systems Review of Systems  Constitutional: Negative for chills and fever.  HENT: Negative for congestion and rhinorrhea.   Eyes: Positive for visual disturbance. Negative for pain.  Respiratory: Negative for cough and shortness of breath.   Cardiovascular: Negative for chest pain.  Gastrointestinal: Negative  for abdominal pain, nausea and vomiting.  Genitourinary: Negative for dysuria.  Musculoskeletal: Positive for myalgias and neck pain. Negative for back pain, gait problem and neck stiffness.  Skin: Negative for rash.  Neurological: Positive for dizziness, tremors, weakness, light-headedness and headaches. Negative for seizures, syncope, facial asymmetry, speech difficulty and numbness.  Psychiatric/Behavioral: Positive for dysphoric mood, hallucinations and suicidal ideas.     Physical Exam Updated Vital Signs BP 139/86 (BP Location: Left Arm)   Pulse 81   Temp 98.5 F (36.9 C) (Oral)   Resp (!) 23   SpO2 100%   Physical Exam  Constitutional: She appears well-developed and well-nourished. No distress.  HENT:  Head: Normocephalic and atraumatic.  Mouth/Throat: Oropharynx is clear and moist.  Eyes: Pupils are equal, round, and reactive to light. Conjunctivae and EOM are normal.  Neck: Normal range of motion. Neck supple.  No nuchal rigidity.  No meningismus.  Cardiovascular: Normal rate, regular rhythm, S1 normal and S2 normal.  No murmur heard. Pulmonary/Chest: Effort normal and breath sounds normal. She has no wheezes. She has no rales.  Abdominal: Soft. She exhibits no distension. There is no tenderness. There is no guarding.  Musculoskeletal: Normal range of motion. She exhibits no edema or deformity.  Lymphadenopathy:    She has no cervical adenopathy.  Neurological: She is alert.    Mental Status:  Alert, oriented, thought content appropriate, able to give a coherent history. Speech fluent without evidence of aphasia. Able to follow 2 step commands without difficulty.  Cranial Nerves:  II:  Peripheral visual fields grossly normal, pupils equal, round, reactive to light III,IV, VI: ptosis not present, extra-ocular motions intact bilaterally  V,VII: smile symmetric, facial light touch sensation equal VIII: hearing grossly normal to voice  X: uvula elevates symmetrically  XI: bilateral shoulder shrug symmetric and strong XII: midline tongue extension without fassiculations Motor:  Normal tone. 5/5 in upper and lower extremities bilaterally including strong and equal grip strength and dorsiflexion/plantar flexion Sensory: Pinprick and light touch normal in all extremities.  Deep Tendon Reflexes: 2+ and symmetric in the biceps and patella. No clonus. Cerebellar: normal finger-to-nose with bilateral upper extremities Gait: normal gait and balance Stance: Romberg negative. No pronator drift and good coordination, strength, and position sense with tapping of bilateral arms (performed in sitting position). CV: distal pulses palpable throughout   Skin: Skin is warm and dry. No rash noted. No erythema.  Psychiatric: Her behavior is normal. Judgment and thought content normal.  Slightly flattened affect.  Nursing note and vitals reviewed.    ED Treatments / Results  Labs (all labs ordered are listed, but only abnormal results are displayed) Labs Reviewed  RAPID URINE DRUG SCREEN, HOSP PERFORMED  COMPREHENSIVE METABOLIC PANEL  ETHANOL  SALICYLATE LEVEL  ACETAMINOPHEN LEVEL  CBC  I-STAT BETA HCG BLOOD, ED ([place], [facility], [facility] ONLY)    EKG EKG Interpretation  Date/Time:  Monday July 17 2017 17:54:58 EDT Ventricular Rate:  82 PR Interval:    QRS Duration: 85 QT Interval:  386 QTC Calculation: 451 R Axis:   84 Text Interpretation:  Sinus rhythm no acute ST/T changes no  significant change since 2015 Confirmed by Sherwood Gambler [phone]) on 07/17/2017 5:59:35 PM   Radiology Ct Head Wo Contrast  Result Date: 07/17/2017 CLINICAL DATA:  57 year old female with headache, hallucination, spasms and nightmares while withdrawing from Klonopin. Suicidal ideation as well. EXAM: CT HEAD WITHOUT CONTRAST TECHNIQUE: Contiguous axial images were obtained from the base of the skull through the  vertex without intravenous contrast. COMPARISON:  None. FINDINGS: Brain: No evidence of acute infarction, hemorrhage, hydrocephalus, extra-axial collection or mass lesion/mass effect. Vascular: No hyperdense vessel or unexpected calcification. Skull: Normal. Negative for fracture or focal lesion. Sinuses/Orbits: No acute finding. Other: None. IMPRESSION: Negative head CT. Electronically Signed   By: Jacqulynn Cadet M.D.   On: 07/17/2017 17:33    Procedures Procedures (including critical care time)  Medications Ordered in ED Medications - No data to display   Initial Impression / Assessment and Plan / ED Course  I have reviewed the triage vital signs and the nursing notes.  Pertinent labs & imaging results that were available during my care of the patient were reviewed by me and considered in my medical decision making (see chart for details).  Clinical Course as of Jul 18 2051  Mon Jul 17, 2017  6962 Received call from RN regarding see was score of 7.  Will order 1 mg of Ativan.  CIWA score scheduled for every 2 hours.   [AM]  2030 Discussed leukocytosis with patient.  Patient denies any fevers, chills cough, abdominal pain, nausea, vomiting, diarrhea.  Patient does report she has increased urinary urgency. urinalysis noted negative. Will update patient.   [AM]    Clinical Course User Index [AM] Albesa Seen, PA-C    Patient is nontoxic-appearing and neurologically intact.  Differential diagnosis includes subarachnoid hemorrhage, comp gated migraine.  tension headache,  patient may be withdrawing from [place], as last dose was 4 days ago, however she was reportedly weaned off of it at [facility] psychiatry.  Procedure score, patient does have a score of 7, which qualifies for treatment with Ativan.  Will provide 1 dose, but reassess in 2 hours with see was score.  Patient is scheduled for every 2 hours CIWA protocol.  Patient has passive suicidal ideations at this time, but does have new onset "hearing voices".  Patient is alert and oriented x4 in the room.  Anticipate that patient requires reassessment by psychiatry.   6:49 PM is no clear medical cause identified at this time.  CT scan of the head negative for intracranial bleeding or acute malformations.  Patient does exhibit a leukocytosis of 12.7, which I believe is a stress reaction.  Patient denies any infectious symptoms, I do not believe patient is septic.  EKG, reviewed by me, in normal sinus rhythm without evidence of acute ischemia, infarction, or arrhythmia.  This is a shared visit with Dr. Sherwood Gambler. Patient was independently evaluated by this attending physician. Attending physician consulted in evaluation and management.  8:53 PM spoke with Delilah Shan TTS, who recommends inpatient.  Patient fully voluntary at this time.  Final Clinical Impressions(s) / ED Diagnoses   Final diagnoses:  Acute stress reaction  Suicidal ideations  Right-sided headache    ED Discharge Orders    None       Tamala Julian 07/17/17 2054    Sherwood Gambler, MD 07/19/17 1501

## 2017-07-17 NOTE — ED Notes (Signed)
Bed: [facility] Expected date:  Expected time:  Means of arrival:  Comments: 

## 2017-07-17 NOTE — ED Notes (Signed)
Hysterectomy
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
## 2017-07-18 ENCOUNTER — Encounter (HOSPITAL_COMMUNITY): Payer: Self-pay

## 2017-07-18 ENCOUNTER — Other Ambulatory Visit: Payer: Self-pay

## 2017-07-18 ENCOUNTER — Inpatient Hospital Stay (HOSPITAL_COMMUNITY)
Admission: AD | Admit: 2017-07-18 | Discharge: 2017-07-28 | DRG: 885 | Disposition: A | Discharge status: Home / Self Care | Payer: Medicare HMO | Source: Intra-hospital | Attending: Psychiatry | Admitting: Psychiatry

## 2017-07-18 DIAGNOSIS — M8580 Other specified disorders of bone density and structure, unspecified site: Secondary | ICD-10-CM | POA: Diagnosis present

## 2017-07-18 DIAGNOSIS — Z818 Family history of other mental and behavioral disorders: Secondary | ICD-10-CM | POA: Diagnosis not present

## 2017-07-18 DIAGNOSIS — F310 Bipolar disorder, current episode hypomanic: Secondary | ICD-10-CM | POA: Diagnosis not present

## 2017-07-18 DIAGNOSIS — R200 Anesthesia of skin: Secondary | ICD-10-CM

## 2017-07-18 DIAGNOSIS — R69 Illness, unspecified: Secondary | ICD-10-CM | POA: Diagnosis not present

## 2017-07-18 DIAGNOSIS — F319 Bipolar disorder, unspecified: Secondary | ICD-10-CM | POA: Diagnosis present

## 2017-07-18 DIAGNOSIS — F419 Anxiety disorder, unspecified: Secondary | ICD-10-CM

## 2017-07-18 DIAGNOSIS — Z888 Allergy status to other drugs, medicaments and biological substances status: Secondary | ICD-10-CM | POA: Diagnosis not present

## 2017-07-18 DIAGNOSIS — M62838 Other muscle spasm: Secondary | ICD-10-CM | POA: Diagnosis not present

## 2017-07-18 DIAGNOSIS — I739 Peripheral vascular disease, unspecified: Secondary | ICD-10-CM | POA: Diagnosis not present

## 2017-07-18 DIAGNOSIS — R45851 Suicidal ideations: Secondary | ICD-10-CM

## 2017-07-18 DIAGNOSIS — Z79899 Other long term (current) drug therapy: Secondary | ICD-10-CM | POA: Diagnosis not present

## 2017-07-18 DIAGNOSIS — F332 Major depressive disorder, recurrent severe without psychotic features: Principal | ICD-10-CM | POA: Diagnosis present

## 2017-07-18 DIAGNOSIS — Z882 Allergy status to sulfonamides status: Secondary | ICD-10-CM | POA: Diagnosis not present

## 2017-07-18 DIAGNOSIS — R450 Nervousness: Secondary | ICD-10-CM

## 2017-07-18 DIAGNOSIS — Z87891 Personal history of nicotine dependence: Secondary | ICD-10-CM

## 2017-07-18 DIAGNOSIS — G4700 Insomnia, unspecified: Secondary | ICD-10-CM | POA: Diagnosis present

## 2017-07-18 DIAGNOSIS — Z915 Personal history of self-harm: Secondary | ICD-10-CM

## 2017-07-18 DIAGNOSIS — Z91419 Personal history of unspecified adult abuse: Secondary | ICD-10-CM | POA: Diagnosis not present

## 2017-07-18 DIAGNOSIS — M2550 Pain in unspecified joint: Secondary | ICD-10-CM | POA: Diagnosis not present

## 2017-07-18 DIAGNOSIS — F13232 Sedative, hypnotic or anxiolytic dependence with withdrawal with perceptual disturbance: Secondary | ICD-10-CM | POA: Diagnosis not present

## 2017-07-18 DIAGNOSIS — J449 Chronic obstructive pulmonary disease, unspecified: Secondary | ICD-10-CM | POA: Diagnosis present

## 2017-07-18 MED ORDER — QUETIAPINE FUMARATE 200 MG PO TABS
200.0000 mg | ORAL_TABLET | Freq: Every day | ORAL | Status: DC
  Administered 2017-07-18: 200 mg via ORAL
  Filled 2017-07-18 (×3): qty 1

## 2017-07-18 MED ORDER — GABAPENTIN 100 MG PO CAPS
200.0000 mg | ORAL_CAPSULE | Freq: Two times a day (BID) | ORAL | Status: DC
  Administered 2017-07-18: 200 mg via ORAL
  Filled 2017-07-18: qty 2

## 2017-07-18 MED ORDER — ACETAMINOPHEN 325 MG PO TABS
650.0000 mg | ORAL_TABLET | Freq: Four times a day (QID) | ORAL | Status: DC | PRN
  Administered 2017-07-20 – 2017-07-22 (×5): 650 mg via ORAL
  Filled 2017-07-18 (×5): qty 2

## 2017-07-18 MED ORDER — ACETAMINOPHEN 325 MG PO TABS: 650.0000 mg | ORAL_TABLET | Freq: Four times a day (QID) | ORAL | Status: DC | PRN

## 2017-07-18 MED ORDER — TRAZODONE HCL 50 MG PO TABS
50.0000 mg | ORAL_TABLET | Freq: Every evening | ORAL | Status: DC | PRN
  Administered 2017-07-18: 50 mg via ORAL
  Filled 2017-07-18: qty 1

## 2017-07-18 MED ORDER — ALUM & MAG HYDROXIDE-SIMETH 200-200-20 MG/5ML PO SUSP: 30.0000 mL | ORAL | Status: DC | PRN

## 2017-07-18 MED ORDER — ACETAMINOPHEN 325 MG PO TABS
650.0000 mg | ORAL_TABLET | Freq: Once | ORAL | Status: AC
  Administered 2017-07-18: 650 mg via ORAL
  Filled 2017-07-18: qty 2

## 2017-07-18 MED ORDER — MAGNESIUM HYDROXIDE 400 MG/5ML PO SUSP: 30.0000 mL | Freq: Every day | ORAL | Status: DC | PRN

## 2017-07-18 MED ORDER — HYDROXYZINE PAMOATE 25 MG PO CAPS
25.0000 mg | ORAL_CAPSULE | Freq: Four times a day (QID) | ORAL | Status: DC | PRN
  Filled 2017-07-18: qty 1

## 2017-07-18 MED ORDER — QUETIAPINE FUMARATE 100 MG PO TABS
100.0000 mg | ORAL_TABLET | Freq: Every day | ORAL | Status: DC
Start: 2017-07-18 — End: 2017-07-18

## 2017-07-18 MED ORDER — MAGNESIUM HYDROXIDE 400 MG/5ML PO SUSP
30.0000 mL | Freq: Every day | ORAL | Status: DC | PRN
  Administered 2017-07-20 – 2017-07-25 (×3): 30 mL via ORAL
  Filled 2017-07-18 (×3): qty 30

## 2017-07-18 MED ORDER — BUSPIRONE HCL 15 MG PO TABS
15.0000 mg | ORAL_TABLET | Freq: Three times a day (TID) | ORAL | Status: DC
  Administered 2017-07-18 – 2017-07-28 (×30): 15 mg via ORAL
  Filled 2017-07-18 (×27): qty 1
  Filled 2017-07-18: qty 3
  Filled 2017-07-18 (×4): qty 1
  Filled 2017-07-18: qty 3
  Filled 2017-07-18 (×4): qty 1

## 2017-07-18 MED ORDER — GABAPENTIN 300 MG PO CAPS: 300.0000 mg | ORAL_CAPSULE | Freq: Two times a day (BID) | ORAL | Status: DC

## 2017-07-18 MED ORDER — HYDROXYZINE HCL 25 MG PO TABS
25.0000 mg | ORAL_TABLET | Freq: Three times a day (TID) | ORAL | Status: DC | PRN
  Administered 2017-07-18 – 2017-07-19 (×2): 25 mg via ORAL
  Filled 2017-07-18 (×2): qty 1

## 2017-07-18 MED ORDER — QUETIAPINE FUMARATE 100 MG PO TABS: 200.0000 mg | ORAL_TABLET | Freq: Every day | ORAL | Status: DC

## 2017-07-18 MED ORDER — DULOXETINE HCL 30 MG PO CPEP
90.0000 mg | ORAL_CAPSULE | Freq: Every day | ORAL | Status: DC
  Administered 2017-07-19 – 2017-07-24 (×6): 90 mg via ORAL
  Filled 2017-07-18 (×9): qty 3

## 2017-07-18 MED ORDER — GABAPENTIN 300 MG PO CAPS
300.0000 mg | ORAL_CAPSULE | Freq: Two times a day (BID) | ORAL | Status: DC
  Administered 2017-07-18 – 2017-07-19 (×2): 300 mg via ORAL
  Filled 2017-07-18 (×5): qty 1

## 2017-07-18 NOTE — ED Provider Notes (Signed)
Left ear pain. TM normal. External ear normal. No facial swelling. No preauricular nodes. No dental or intraoral acute abnormality noted. Tylenol for acute otalgia     Jola Schmidt, MD 07/18/17 [phone]

## 2017-07-18 NOTE — Tx Team (Signed)
Initial Treatment Plan 07/18/2017 6:39 PM Gardiner Ramus SJG:[id number]    PATIENT STRESSORS: Medication change or noncompliance Substance abuse   PATIENT STRENGTHS: Curator fund of knowledge Motivation for treatment/growth   PATIENT IDENTIFIED PROBLEMS: Risk for suicide  SA  anxiety  depression  "detoxed and feel better to go home, I feel like I've lost my mind"             DISCHARGE CRITERIA:  Improved stabilization in mood, thinking, and/or behavior Verbal commitment to aftercare and medication compliance  PRELIMINARY DISCHARGE PLAN: Attend aftercare/continuing care group Participate in family therapy  PATIENT/FAMILY INVOLVEMENT: This treatment plan has been presented to and reviewed with the patient, Crystal Duarte.  The patient and family have been given the opportunity to ask questions and make suggestions.  Providence Crosby, RN 07/18/2017, 6:39 PM

## 2017-07-18 NOTE — ED Notes (Signed)
Patient reported she felt shaky and requested something for her anxiety.  Vistaril given per prn orders.

## 2017-07-18 NOTE — ED Notes (Signed)
Report called to [facility], [facility].

## 2017-07-18 NOTE — ED Notes (Signed)
Patient c/o left ear pain.  Patient asked if a doctor could look at it.  Dr. Venora Maples notified.

## 2017-07-18 NOTE — Progress Notes (Signed)
D: Pt was pacing in the hallway upon initial approach.  Pt presents with anxious affect and mood.  She reports she is "detoxing from my Klonopin, I just really want my mind back."  She reports she tapered off of Klonopin at another facility last week and the last time she took Klonopin was "last Wednesday."  Pt denies SI/HI, denies hallucinations.   Pt has been visible in milieu with few peer interactions.     A: Introduced self to pt.  Actively listened to pt and offered support and encouragement. Medication administered per order.  PRN medication administered for sleep.  Q15 minute safety checks maintained.  R: Pt is safe on the unit.  Pt is compliant with medications.  Pt verbally contracts for safety.  Will continue to monitor and assess.

## 2017-07-18 NOTE — ED Notes (Signed)
Dr. Venora Maples examined patient and assessment was negative for infection or any other diagnosis.  Tylenol ordered for pain and given prior to discharge.  Patient advised if pain worsens to notify nurse practitioner at [facility] or return to ED.

## 2017-07-18 NOTE — ED Notes (Signed)
Patient to be admitted to [facility] after 3:30 today.

## 2017-07-18 NOTE — ED Notes (Signed)
Patient up to use the phone.

## 2017-07-18 NOTE — Progress Notes (Signed)
Patient ID: Crystal Duarte, female   DOB: [birth year], 57 y.o.   MRN: [id number]  Admission Note:  57 yr female who presents VC in no acute distress for the treatment of SI, Benzo detox and Depression. Pt appears flat and depressed. Pt was calm and cooperative with admission process. Pt presents with passive SI and contracts for safety upon admission. Pt endorses [facility] . Pt stated she was detoxed off Klonopin for 9 days went home and 2 days later started feeling bad and hallucinating, pt said her psychiatrist suggested she come to the hospital.   A:Skin was assessed by Nira Conn) and found to be clear of any abnormal marks . PT searched and no contraband found, POC and unit policies explained and understanding verbalized. Consents obtained. Food and fluids offered, and  accepted.  R: Pt had no additional questions or concerns.

## 2017-07-18 NOTE — BH Assessment (Signed)
[place] Assessment Progress Note  Per Corena Pilgrim, MD, this pt requires psychiatric hospitalization at this time.  Clayborne Dana, RN, [facility] has assigned pt to [facility] Rm 300-1; [place] will be ready to receive pt at 15:30.  Pt has signed Voluntary Admission and Consent for Treatment, as well as Consent to Release Information to pt's son and daughter, and to Ambrose Finland, MD and to Doroteo Glassman, and notification calls have been placed to the providers.  Signed forms have been faxed to [facility].  Pt's nurse, Caren Griffins, has been notified, and agrees to send original paperwork along with pt via [facility], and to call report to [phone].  Jalene Mullet, [place] Coordinator [phone]

## 2017-07-18 NOTE — Consult Note (Signed)
[place] Psychiatry Consult   Reason for Consult:  Suicidal thoughts, withdrawing from [place] Referring Physician:  EDP Patient Identification: Crystal Duarte MRN:  [id number] Principal Diagnosis: Affective bipolar disorder [facility]) Diagnosis:   Patient Active Problem List   Diagnosis Date Noted  . DOE (dyspnea on exertion) [R06.09] 12/26/2016  . COPD GOLD II  [J44.9] 12/26/2016  . Orthostatic hypotension [I95.1] 12/14/2016  . Chest tightness [R07.89] 12/14/2016  . Weight loss [R63.4] 12/14/2016  . Routine general medical examination at a health care facility [Z00.00] 10/09/2015  . 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 Hx of adenomatous polyp of colon [Z86.010] 12/04/2014  . Memory loss [R41.3] 10/04/2014  . Affective bipolar disorder ([place]) [F31.9] 03/12/2014  . Peripheral vascular disease, unspecified ([place]) [I73.9] 10/10/2013  . Smoker [F17.200] 08/28/2012  . Menopause [Z78.0] 08/28/2012  . Multiple pulmonary nodules [R91.8] 11/12/2010    Total Time spent with patient: 45 minutes  Subjective:   Crystal Duarte is a 57 y.o. female patient admitted with suicidal thoughts.  HPI: Patient with history of Bipolar disorder and anxiety disorder who was brought to the hospital for evaluation following suicidal thoughts. Patient reports that she was weaned off Klonopin last week after taking it for 4 years. She states that she was doing fine but developed diffuse muscle pain, right-sided neck spasming, numbness of right hand/ lower feet, shaking episodes and visual hallucinations. She denies drugs and alcohol abuse. She is concerned that she might not be able to wean off Klonopin.  Past Psychiatric History: as above  Risk to Self: Suicidal Ideation: No Suicidal Intent: No Is patient at risk for suicide?: No Suicidal Plan?: No Access to Means: No What has been your use of drugs/alcohol within the last 12 months?: none How many times?: 3 Other Self Harm Risks: none Triggers  for Past Attempts: Unpredictable Intentional Self Injurious Behavior: None Risk to Others: Homicidal Ideation: No Thoughts of Harm to Others: No Current Homicidal Intent: No Current Homicidal Plan: No Access to Homicidal Means: No Identified Victim: NA History of harm to others?: No Assessment of Violence: None Noted Violent Behavior Description: none Does patient have access to weapons?: No Criminal Charges Pending?: No Does patient have a court date: No Prior Inpatient Therapy: Prior Inpatient Therapy: Yes Prior Therapy Dates: 2012, 2015, July 04, 2017 and other times Prior Therapy Facilty/Provider(s): [facility], [facility], [place] Reason for Treatment: SI and detox from [facility] Prior Outpatient Therapy: Prior Outpatient Therapy: Yes Prior Therapy Dates: current Prior Therapy Facilty/Provider(s): Dr. Macon Large Reason for Treatment: depression Does patient have an ACCT team?: No Does patient have Intensive In-House Services?  : No Does patient have [facility] services? : No Does patient have P4CC services?: No  Past Medical History:  Past Medical History:  Diagnosis Date  . Coccidioidomycosis, pulmonary ([place])   . Depression   . Hx of adenomatous polyp of colon 12/04/2014  . Osteopenia 11/2016   T score -2.2 FRAX 17%/2.4%  . Panic attack     Past Surgical History:  Procedure Laterality Date  . ABDOMINAL HYSTERECTOMY    . COLONOSCOPY  2001   hemorrhoidectomy  . LUNG SURGERY  2001   VATS surgery   . OVARIAN CYST REMOVAL  1970's  . VESICOVAGINAL FISTULA CLOSURE W/ TAH  2005   Family History:  Family History  Problem Relation Age of Onset  . Asthma Mother   . Ovarian cancer Mother   . Cancer Mother        OVARIAN  . Varicose Veins  Mother   . Heart disease Father   . Peripheral vascular disease Father   . Cancer Maternal Grandmother        LUNG- LUNG  . Colon cancer Neg Hx    Family Psychiatric  History:  Social History:  Social History   Substance and Sexual  Activity  Alcohol Use No  . Alcohol/week: 0.0 oz     Social History   Substance and Sexual Activity  Drug Use No    Social History   Socioeconomic History  . Marital status: Divorced    Spouse name: Not on file  . Number of children: 2  . Years of education: Not on file  . Highest education level: Not on file  Occupational History  . Occupation: Forensic psychologist: [employer]  . Financial resource strain: Not on file  . Food insecurity:    Worry: Not on file    Inability: Not on file  . Transportation needs:    Medical: Not on file    Non-medical: Not on file  Tobacco Use  . Smoking status: Former Smoker    Packs/day: 1.00    Years: 42.00    Pack years: 42.00    Types: Cigarettes    Last attempt to quit: 09/25/2016    Years since quitting: 0.8  . Smokeless tobacco: Former Systems developer    Quit date: 06/13/2016  Substance and Sexual Activity  . Alcohol use: No    Alcohol/week: 0.0 oz  . Drug use: No  . Sexual activity: Not Currently    Birth control/protection: Surgical  Lifestyle  . Physical activity:    Days per week: Not on file    Minutes per session: Not on file  . Stress: Not on file  Relationships  . Social connections:    Talks on phone: Not on file    Gets together: Not on file    Attends religious service: Not on file    Active member of club or organization: Not on file    Attends meetings of clubs or organizations: Not on file    Relationship status: Not on file  Other Topics Concern  . Not on file  Social History Narrative  . Not on file   Additional Social History:    Allergies:   Allergies  Allergen Reactions  . Latuda [Lurasidone Hcl] Anxiety  . Sulfa Antibiotics Rash    Only in sunlight     Labs:  Results for orders placed or performed during the hospital encounter of 07/17/17 (from the past 48 hour(s))  Rapid urine drug screen (hospital performed)     Status: None   Collection Time: 07/17/17  2:20 PM  Result  Value Ref Range   Opiates NONE DETECTED NONE DETECTED   Cocaine NONE DETECTED NONE DETECTED   Benzodiazepines NONE DETECTED NONE DETECTED   Amphetamines NONE DETECTED NONE DETECTED   Tetrahydrocannabinol NONE DETECTED NONE DETECTED   Barbiturates NONE DETECTED NONE DETECTED    Comment: (NOTE) DRUG SCREEN FOR MEDICAL PURPOSES ONLY.  IF CONFIRMATION IS NEEDED FOR ANY PURPOSE, NOTIFY LAB WITHIN 5 DAYS. LOWEST DETECTABLE LIMITS FOR URINE DRUG SCREEN Drug Class                     Cutoff (ng/mL) Amphetamine and metabolites    1000 Barbiturate and metabolites    200 Benzodiazepine                 300 Tricyclics and  metabolites     300 Opiates and metabolites        300 Cocaine and metabolites        300 THC                            50 Performed at [facility] [address], [place], [place] [address]   Comprehensive metabolic panel     Status: Abnormal   Collection Time: 07/17/17  5:35 PM  Result Value Ref Range   Sodium 139 135 - 145 mmol/L   Potassium 4.4 3.5 - 5.1 mmol/L   Chloride 104 101 - 111 mmol/L   CO2 25 22 - 32 mmol/L   Glucose, Bld 101 (H) 65 - 99 mg/dL   BUN 10 6 - 20 mg/dL   Creatinine, Ser 0.87 0.44 - 1.00 mg/dL   Calcium 9.6 8.9 - 10.3 mg/dL   Total Protein 7.2 6.5 - 8.1 g/dL   Albumin 4.1 3.5 - 5.0 g/dL   AST 16 15 - 41 U/L   ALT 16 14 - 54 U/L   Alkaline Phosphatase 77 38 - 126 U/L   Total Bilirubin 1.1 0.3 - 1.2 mg/dL   GFR calc non Af Amer >60 >60 mL/min   GFR calc Af Amer >60 >60 mL/min    Comment: (NOTE) The eGFR has been calculated using the CKD EPI equation. This calculation has not been validated in all clinical situations. eGFR's persistently <60 mL/min signify possible Chronic Kidney Disease.    Anion gap 10 5 - 15    Comment: Performed at [facility], [place] [address], [place], [place] [address]  Ethanol     Status: None   Collection Time: 07/17/17  5:35 PM  Result Value Ref Range   Alcohol, Ethyl  (B) <10 <10 mg/dL    Comment:        LOWEST DETECTABLE LIMIT FOR SERUM ALCOHOL IS 10 mg/dL FOR MEDICAL PURPOSES ONLY Performed at [facility] [address], [place], [place] [address]   Salicylate level     Status: None   Collection Time: 07/17/17  5:35 PM  Result Value Ref Range   Salicylate Lvl <0.1 2.8 - 30.0 mg/dL    Comment: Performed at [facility], [place] [address] [place], [place] [address]  Acetaminophen level     Status: Abnormal   Collection Time: 07/17/17  5:35 PM  Result Value Ref Range   Acetaminophen (Tylenol), Serum <10 (L) 10 - 30 ug/mL    Comment:        THERAPEUTIC CONCENTRATIONS VARY SIGNIFICANTLY. A RANGE OF 10-30 ug/mL MAY BE AN EFFECTIVE CONCENTRATION FOR MANY PATIENTS. HOWEVER, SOME ARE BEST TREATED AT CONCENTRATIONS OUTSIDE THIS RANGE. ACETAMINOPHEN CONCENTRATIONS >150 ug/mL AT 4 HOURS AFTER INGESTION AND >50 ug/mL AT 12 HOURS AFTER INGESTION ARE OFTEN ASSOCIATED WITH TOXIC REACTIONS. Performed at [facility], [place] [address] [place], [place] [address]   cbc     Status: Abnormal   Collection Time: 07/17/17  5:35 PM  Result Value Ref Range   WBC 12.7 (H) 4.0 - 10.5 K/uL   RBC 4.20 3.87 - 5.11 MIL/uL   Hemoglobin 13.3 12.0 - 15.0 g/dL   HCT 39.2 36.0 - 46.0 %   MCV 93.3 78.0 - 100.0 fL   MCH 31.7 26.0 - 34.0 pg   MCHC 33.9 30.0 - 36.0 g/dL   RDW 12.9 11.5 - 15.5 %  Platelets 317 150 - 400 K/uL    Comment: Performed at [facility], [place] [address] [place], [place] [address]  Urinalysis, Routine w reflex microscopic     Status: None   Collection Time: 07/17/17  8:19 PM  Result Value Ref Range   Color, Urine YELLOW YELLOW   APPearance CLEAR CLEAR   Specific Gravity, Urine 1.008 1.005 - 1.030   pH 7.0 5.0 - 8.0   Glucose, UA NEGATIVE NEGATIVE mg/dL   Hgb urine dipstick NEGATIVE NEGATIVE   Bilirubin Urine NEGATIVE NEGATIVE   Ketones, ur NEGATIVE NEGATIVE mg/dL    Protein, ur NEGATIVE NEGATIVE mg/dL   Nitrite NEGATIVE NEGATIVE   Leukocytes, UA NEGATIVE NEGATIVE    Comment: Performed at [facility] [address] [place], [place] [address]    Current Facility-Administered Medications  Medication Dose Route Frequency Provider Last Rate Last Dose  . acetaminophen (TYLENOL) tablet 650 mg  650 mg Oral Q4H PRN Valere Dross, Alyssa B, PA-C      . busPIRone (BUSPAR) tablet 15 mg  15 mg Oral TID Langston Masker B, PA-C   15 mg at 07/18/17 0919  . DULoxetine (CYMBALTA) DR capsule 90 mg  90 mg Oral Daily Murray, Alyssa B, PA-C   90 mg at 07/18/17 0920  . gabapentin (NEURONTIN) capsule 300 mg  300 mg Oral BID Kerem Gilmer, MD      . hydrOXYzine (VISTARIL) capsule 25 mg  25 mg Oral Q6H PRN Murray, Alyssa B, PA-C   25 mg at 07/18/17 1040  . QUEtiapine (SEROQUEL) tablet 200 mg  200 mg Oral QHS Corena Pilgrim, MD       Current Outpatient Medications  Medication Sig Dispense Refill  . acetaminophen (TYLENOL) 500 MG tablet Take 1,000 mg by mouth every 6 (six) hours as needed for headache.    . busPIRone (BUSPAR) 15 MG tablet Take 15 mg by mouth 3 (three) times daily.    . Calcium-Vitamin D (CALTRATE 600 PLUS-VIT D PO) Take 600 mg by mouth.    . cetirizine (ZYRTEC) 10 MG tablet Take 10 mg by mouth as needed for allergies.    . Cholecalciferol (VITAMIN D3 PO) Take 1,200 mg by mouth daily.    . DULoxetine (CYMBALTA) 30 MG capsule Take 90 mg by mouth daily. Pt takes a total dose of '90mg'$  of duloxetine. Either by taking three '30mg'$  or one '60mg'$  and one '30mg'$  capsule.    . fluticasone (FLONASE) 50 MCG/ACT nasal spray Place 2 sprays into both nostrils daily. 16 g 6  . folic acid (FOLVITE) 1 MG tablet Take 1 mg by mouth daily.    . hydrOXYzine (VISTARIL) 25 MG capsule Take 25 mg by mouth as needed.    . mometasone-formoterol (DULERA) 100-5 MCG/ACT AERO Inhale 2 puffs into the lungs 2 (two) times daily. 1 Inhaler 11  . Multiple Vitamin (MULTIVITAMIN) tablet  Take 1 tablet by mouth daily. Reported on 10/08/2015    . naphazoline-pheniramine (NAPHCON-A) 0.025-0.3 % ophthalmic solution Place 2 drops into both eyes 4 (four) times daily as needed for eye irritation.    . QUEtiapine (SEROQUEL) 50 MG tablet Take 100 mg by mouth at bedtime.    . traZODone (DESYREL) 100 MG tablet Take 200 mg by mouth at bedtime.     . clonazePAM (KLONOPIN) 1 MG tablet Take 1 mg by mouth 3 (three) times daily as needed for anxiety.       Musculoskeletal: Strength & Muscle Tone: within normal limits Gait &  Station: normal Patient leans: N/A  Psychiatric Specialty Exam: Physical Exam  Psychiatric: Judgment normal. Her mood appears anxious. Her speech is delayed. She is slowed, withdrawn and actively hallucinating. Cognition and memory are normal. She exhibits a depressed mood. She expresses suicidal ideation.    Review of Systems  Constitutional: Positive for malaise/fatigue.  HENT: Negative.   Eyes: Negative.   Respiratory: Negative.   Cardiovascular: Negative.   Gastrointestinal: Negative.   Genitourinary: Negative.   Skin: Negative.   Neurological: Negative.   Endo/Heme/Allergies: Negative.   Psychiatric/Behavioral: Positive for depression, hallucinations and suicidal ideas. The patient is nervous/anxious.     Blood pressure 111/75, pulse 68, temperature 98.1 F (36.7 C), temperature source Oral, resp. rate 14, SpO2 98 %.There is no height or weight on file to calculate BMI.  General Appearance: Casual  Eye Contact:  Fair  Speech:  Clear and Coherent and Slow  Volume:  Decreased  Mood:  Dysphoric  Affect:  Constricted  Thought Process:  Coherent  Orientation:  Full (Time, Place, and Person)  Thought Content:  Logical  Suicidal Thoughts:  denies today  Homicidal Thoughts:  No  Memory:  Immediate;   Fair Recent;   Fair Remote;   Fair  Judgement:  Fair  Insight:  Shallow  Psychomotor Activity:  Psychomotor Retardation and Restlessness  Concentration:   Concentration: Fair and Attention Span: Fair  Recall:  [place] of Knowledge:  Good  Language:  Good  Akathisia:  No  Handed:  Right  AIMS (if indicated):     Assets:  Communication Skills Desire for Improvement  ADL's:  Intact  Cognition:  WNL  Sleep:   poor     Treatment Plan Summary: Daily contact with patient to assess and evaluate symptoms and progress in treatment and Medication management  Continue Buspar and Cymbalta for depression and anxiety Change Seroquel to 200 mg bid for bipolar Start Gabapentin 300 mg twice daily for anxiety/withdrawal symptoms.  Disposition: Recommend psychiatric Inpatient admission when medically cleared.  Corena Pilgrim, MD 07/18/2017 11:23 AM

## 2017-07-18 NOTE — Plan of Care (Signed)
  Problem: Safety: Goal: Periods of time without injury will increase Outcome: Progressing Note:  Pt has not harmed self or others tonight.  She denies SI/HI and verbally contracts for safety.    

## 2017-07-18 NOTE — Progress Notes (Addendum)
Patient stated she is very scared that she will never get her mind back the way it was.  Right now I can't even drive a car.  Just feel not here, like I am going through the motion that my body is here but my mind is not.  Just have shakes, feel like I cannot close my eyes, that they are taped open.  My depression kicks in when I can't sleep.  That is when my suicidal tendencies come out.  Right now I feel brain damaged.  It is the scariest thing I have ever done.  I never used drugs for recreational purposes.  Since age 57 yrs old, I have had suicidal thoughts off and on.  No plan to hurt myself in the hospital, contracts for safety.   Patient stated she has overdosed 3 or 4 times in the past.  Last time in February 2019 by overdosing on pills.  Was going through a very rough time, 39 yr old mom, falling and breaking her bones, had to get help for mom in the house.  Patient's daughter has a new baby, that she tries to help with.  Daughter has 3 children.  Patient's mother is addicted to pain pills now, and mother just finished wound clinic.  Denied HI.  Patient stated she does see things, has heard voices recently.  Voices are mumblings, not specific orders.  When she looks to the side, she gets paranoid at times.  Exhusband use to do cocaine, jaw would swing, clinching his teeth, and this is what she is finding herself doing.   Patient has talked on phone to family.

## 2017-07-18 NOTE — ED Notes (Signed)
[facility] called for transport. 

## 2017-07-18 NOTE — Progress Notes (Signed)
Pt accepted to [facility], room 300-1 to the service of Dr. Mallie Darting, MD. Please call report to [phone].  Clayborne Dana, RN

## 2017-07-19 DIAGNOSIS — Z91419 Personal history of unspecified adult abuse: Secondary | ICD-10-CM

## 2017-07-19 DIAGNOSIS — G4700 Insomnia, unspecified: Secondary | ICD-10-CM

## 2017-07-19 DIAGNOSIS — F419 Anxiety disorder, unspecified: Secondary | ICD-10-CM

## 2017-07-19 DIAGNOSIS — R450 Nervousness: Secondary | ICD-10-CM

## 2017-07-19 DIAGNOSIS — R45851 Suicidal ideations: Secondary | ICD-10-CM

## 2017-07-19 DIAGNOSIS — F332 Major depressive disorder, recurrent severe without psychotic features: Principal | ICD-10-CM

## 2017-07-19 DIAGNOSIS — Z818 Family history of other mental and behavioral disorders: Secondary | ICD-10-CM

## 2017-07-19 MED ORDER — HYDROXYZINE HCL 50 MG PO TABS
50.0000 mg | ORAL_TABLET | Freq: Three times a day (TID) | ORAL | Status: DC
  Administered 2017-07-19 – 2017-07-20 (×3): 50 mg via ORAL
  Filled 2017-07-19 (×7): qty 1

## 2017-07-19 MED ORDER — GABAPENTIN 300 MG PO CAPS
300.0000 mg | ORAL_CAPSULE | Freq: Three times a day (TID) | ORAL | Status: DC
  Administered 2017-07-19 – 2017-07-22 (×8): 300 mg via ORAL
  Filled 2017-07-19 (×11): qty 1

## 2017-07-19 MED ORDER — TRAZODONE HCL 100 MG PO TABS: 200.0000 mg | ORAL_TABLET | Freq: Every evening | ORAL | Status: DC | PRN

## 2017-07-19 MED ORDER — TRAZODONE HCL 100 MG PO TABS
100.0000 mg | ORAL_TABLET | Freq: Every evening | ORAL | Status: DC | PRN
  Administered 2017-07-19: 100 mg via ORAL
  Filled 2017-07-19 (×6): qty 1

## 2017-07-19 MED ORDER — QUETIAPINE FUMARATE 100 MG PO TABS
100.0000 mg | ORAL_TABLET | Freq: Every day | ORAL | Status: DC
  Administered 2017-07-19: 100 mg via ORAL
  Filled 2017-07-19 (×2): qty 1

## 2017-07-19 MED ORDER — GABAPENTIN 300 MG PO CAPS
300.0000 mg | ORAL_CAPSULE | Freq: Four times a day (QID) | ORAL | Status: DC
  Administered 2017-07-19: 300 mg via ORAL
  Filled 2017-07-19 (×4): qty 1

## 2017-07-19 MED ORDER — TRAZODONE HCL 100 MG PO TABS
100.0000 mg | ORAL_TABLET | Freq: Every evening | ORAL | Status: DC | PRN
  Administered 2017-07-19: 100 mg via ORAL
  Filled 2017-07-19: qty 1

## 2017-07-19 NOTE — Progress Notes (Signed)
Recreation Therapy Notes  Date: 4.17.19 Time: 9:30 a.m. Location: 300 Hall Dayroom   Group Topic: Stress Management   Goal Area(s) Addresses:  Goal 1.1: To reduce stress  -Patient will feel a reduction in stress level  -Patient will understand the importance of stress management  -Patient will participate during stress management group      Intervention: Stress Management  Activity: Guided Imagery: Patients were in a peaceful environment with soft lighting enhancing patients mood. Patients listened to a deep concentration meditation from the calm app.  Education: Stress Management, Discharge Planning.    Education Outcome: Acknowledges edcuation/In group clarification offered/Needs additional education   Clinical Observations/Feedback:: Patient did not attend     Ranell Patrick, Recreation Therapy Intern   Ranell Patrick 07/19/2017 8:29 AM

## 2017-07-19 NOTE — Progress Notes (Signed)
D: Pt was in the dayroom upon initial approach.  Pt presents with appropriate affect and anxious mood.  She reports her day was "actually pretty good, they upped my Neurontin and Vistaril and that helped take the edge off.  I'm really happy I met with the psychiatrist and nurse practitioner.  It made me feel so much better."  Pt denies SI/HI, denies hallucinations, denies pain.  Pt has been visible in milieu interacting with peers and staff appropriately.     A: Introduced self to pt.  Actively listened to pt and offered support and encouragement. Medication administered per order.  PRN medication administered for sleep per pt request.  Pt asked for Vistaril, Neurontin, and Buspar during medication pass and she was informed that these medications are scheduled three times daily.  She stated "we agreed on 150 mg of Seroquel and I usually take double that of Trazodone.  This is like candy to me."  Pt was encouraged to try to rest with the medications that were currently ordered for her and to notify staff if she has difficulty sleeping.  She verbalized understanding.  Q15 minute safety checks maintained.  R: Pt is safe on the unit.  She continued to report difficulty sleeping after receiving PRN Trazodone 100 mg.  On-site provider was notified and order was placed for additional Trazodone 100 mg PO, which was administered.  Pt is compliant with medications.  Pt verbally contracts for safety.  Will continue to monitor and assess.

## 2017-07-19 NOTE — BHH Suicide Risk Assessment (Signed)
[place] INPATIENT:  Family/Significant Other Suicide Prevention Education  Suicide Prevention Education:  Education Completed; patient's son, Carleene Mains [phone])  has been identified by the patient as the family member/significant other with whom the patient will be residing, and identified as the person(s) who will aid the patient in the event of a mental health crisis (suicidal ideations/suicide attempt).  With written consent from the patient, the family member/significant other has been provided the following suicide prevention education, prior to the and/or following the discharge of the patient.  The suicide prevention education provided includes the following:  Suicide risk factors  Suicide prevention and interventions  National Suicide Hotline telephone number  [facility] assessment telephone number  [facility] Emergency Assistance [place] and/or Residential Mobile Crisis Unit telephone number  Request made of family/significant other to:  Remove weapons (e.g., guns, rifles, knives), all items previously/currently identified as safety concern.    Remove drugs/medications (over-the-counter, prescriptions, illicit drugs), all items previously/currently identified as a safety concern.  The family member/significant other verbalizes understanding of the suicide prevention education information provided.  The family member/significant other agrees to remove the items of safety concern listed above.  Patient's son, Crystal Duarte, states that there are no guns or weapons in the home and that he has been responsible for dispensing her medications following a recent hospitalization in [place] within the past 2 months.  Crystal Duarte shares that his mother has had two prior inpatient hospitalizations at [facility] within the past two months. Each hospitalization lasted just over one week.   Mark expressed concerns regarding the patient's klonapin withdrawal symptoms  and hoping her inability to sleep and visual hallucinations are short term. Crystal Duarte also expressed concerns that his mother would be discharged too soon and before she is medically stabilized.   Crystal Duarte shares that his mother was first diagnosed with depression at age 57 and has received various treatments throughout her life including ECT. Crystal Duarte shares that "when things get really bad" the patient will reach out and tell him or other family that she is feeling depressed or suicidal. For that reason, Crystal Duarte does not have specific safety concerns at this time.   [name] 07/19/2017, 11:37 AM

## 2017-07-19 NOTE — B[facility] Group Notes (Signed)
[facility] LCSW Group Therapy Note  Date/Time   07/19/17   Type of Therapy/Topic:  Group Therapy:  Balance in Life  Participation Level:  Active  Description of Group:    This group will address the concept of balance and how it feels and looks when one is unbalanced. Patients will be encouraged to process areas in their lives that are out of balance, and identify reasons for remaining unbalanced. Facilitators will guide patients utilizing problem- solving interventions to address and correct the stressor making their life unbalanced. Understanding and applying boundaries will be explored and addressed for obtaining  and maintaining a balanced life. Patients will be encouraged to explore ways to assertively make their unbalanced needs known to significant others in their lives, using other group members and facilitator for support and feedback.  Therapeutic Goals: 1. Patient will identify two or more emotions or situations they have that consume much of in their lives. 2. Patient will identify signs/triggers that life has become out of balance:  3. Patient will identify two ways to set boundaries in order to achieve balance in their lives:  4. Patient will demonstrate ability to communicate their needs through discussion and/or role plays  Summary of Patient Progress: Patient actively and appropriately participated in the group discussion. Patient identified family relationships as something she is struggling with and states that she is getting more help caring for her mother.   Therapeutic Modalities:   Cognitive Behavioral Therapy Solution-Focused Therapy Assertiveness Training
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
## 2017-07-19 NOTE — Progress Notes (Signed)
Patient ID: Crystal Duarte, female   DOB: 1960-12-04, 57 y.o.   MRN: [id number]  Nursing Progress Note [id number]  Data: Patient presents with anxious mood. Patient reports "I'm still having muscle spasms and my heart is racing". HR mildly elevated when standing. Patient with no visible sweating, tremors or response to internal stimuli. Patient complains of R shoulder pain. Patient complaint with scheduled medications. Patient denies SI/HI/AVH. Patient contracts for safety on the unit at this time. Patient completed self-inventory sheet and rates depression, hopelessness, and anxiety 3,7,10 respectively. Patient states goal for today is to "get withdrawal symptoms under control" and "meet with my psychiatrist". Patient reports by the end of the day her regimen of Gabapentin and Vistaril has been very effective in decreasing her anxiety. Patient is not endorsing withdrawal symptoms at this time.  Action: Patient educated about and provided medication per provider's orders. Patient safety maintained with q15 min safety checks. Low fall risk precautions in place. Emotional support given. 1:1 interaction and active listening provided. Patient encouraged to attend meals and groups. Labs, vital signs and patient behavior monitored throughout shift. Patient encouraged to work on treatment plan and goals.  Response: Patient remains safe on the unit at this time. Patient is interacting with peers appropriately on the unit. Will continue to support and monitor.
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
## 2017-07-19 NOTE — Tx Team (Signed)
Interdisciplinary Treatment and Diagnostic Plan Update  07/19/2017 Time of Session: 0830AM Cia Garretson MRN: [id number]  Principal Diagnosis: MDD, recurrent, severe, without psychosis  Secondary Diagnoses: Active Problems:   Bipolar affective ([place])   MDD (major depressive disorder), recurrent severe, without psychosis ([place])   Current Medications:  Current Facility-Administered Medications  Medication Dose Route Frequency Provider Last Rate Last Dose  . acetaminophen (TYLENOL) tablet 650 mg  650 mg Oral Q6H PRN Nanci Pina, FNP      . alum & mag hydroxide-simeth (MAALOX/MYLANTA) 200-200-20 MG/5ML suspension 30 mL  30 mL Oral Q4H PRN Starkes, Takia S, FNP      . busPIRone (BUSPAR) tablet 15 mg  15 mg Oral TID Ethelene Hal, NP   15 mg at 07/19/17 0748  . DULoxetine (CYMBALTA) DR capsule 90 mg  90 mg Oral Daily Ethelene Hal, NP   90 mg at 07/19/17 0748  . gabapentin (NEURONTIN) capsule 300 mg  300 mg Oral BID Ethelene Hal, NP   300 mg at 07/19/17 0748  . hydrOXYzine (ATARAX/VISTARIL) tablet 25 mg  25 mg Oral TID PRN Ethelene Hal, NP   25 mg at 07/19/17 0748  . magnesium hydroxide (MILK OF MAGNESIA) suspension 30 mL  30 mL Oral Daily PRN Nanci Pina, FNP      . QUEtiapine (SEROQUEL) tablet 200 mg  200 mg Oral QHS Ethelene Hal, NP   200 mg at 07/18/17 2109  . traZODone (DESYREL) tablet 50 mg  50 mg Oral QHS PRN Ethelene Hal, NP   50 mg at 07/18/17 2109   PTA Medications: Medications Prior to Admission  Medication Sig Dispense Refill Last Dose  . acetaminophen (TYLENOL) 500 MG tablet Take 1,000 mg by mouth every 6 (six) hours as needed for headache.   07/16/2017 at Unknown time  . busPIRone (BUSPAR) 15 MG tablet Take 15 mg by mouth 3 (three) times daily.   07/17/2017 at Unknown time  . Calcium-Vitamin D (CALTRATE 600 PLUS-VIT D PO) Take 600 mg by mouth.   07/17/2017 at Unknown time  . cetirizine (ZYRTEC) 10 MG tablet Take 10 mg by  mouth as needed for allergies.   Past Week at Unknown time  . Cholecalciferol (VITAMIN D3 PO) Take 1,200 mg by mouth daily.   07/17/2017 at Unknown time  . clonazePAM (KLONOPIN) 1 MG tablet Take 1 mg by mouth 3 (three) times daily as needed for anxiety.    Not Taking at Unknown time  . DULoxetine (CYMBALTA) 30 MG capsule Take 90 mg by mouth daily. Pt takes a total dose of '90mg'$  of duloxetine. Either by taking three '30mg'$  or one '60mg'$  and one '30mg'$  capsule.   07/17/2017 at Unknown time  . fluticasone (FLONASE) 50 MCG/ACT nasal spray Place 2 sprays into both nostrils daily. 16 g 6 Past Week at Unknown time  . folic acid (FOLVITE) 1 MG tablet Take 1 mg by mouth daily.   07/17/2017 at Unknown time  . hydrOXYzine (VISTARIL) 25 MG capsule Take 25 mg by mouth as needed.   Past Week at Unknown time  . mometasone-formoterol (DULERA) 100-5 MCG/ACT AERO Inhale 2 puffs into the lungs 2 (two) times daily. 1 Inhaler 11 unknown  . Multiple Vitamin (MULTIVITAMIN) tablet Take 1 tablet by mouth daily. Reported on 10/08/2015   07/17/2017 at Unknown time  . naphazoline-pheniramine (NAPHCON-A) 0.025-0.3 % ophthalmic solution Place 2 drops into both eyes 4 (four) times daily as needed for eye irritation.   Past  Week at Unknown time  . QUEtiapine (SEROQUEL) 50 MG tablet Take 100 mg by mouth at bedtime.   07/16/2017 at Unknown time  . traZODone (DESYREL) 100 MG tablet Take 200 mg by mouth at bedtime.    07/17/2017 at Unknown time    Patient Stressors: Medication change or noncompliance Substance abuse  Patient Strengths: Curator fund of knowledge Motivation for treatment/growth  Treatment Modalities: Medication Management, Group therapy, Case management,  1 to 1 session with clinician, Psychoeducation, Recreational therapy.   Physician Treatment Plan for Primary Diagnosis: MDD, recurrent, severe, without psychosis  Medication Management: Evaluate patient's response, side effects, and tolerance of  medication regimen.  Therapeutic Interventions: 1 to 1 sessions, Unit Group sessions and Medication administration.  Evaluation of Outcomes: Not Met  Physician Treatment Plan for Secondary Diagnosis: Active Problems:   Bipolar affective ([place])   MDD (major depressive disorder), recurrent severe, without psychosis ([place])  Medication Management: Evaluate patient's response, side effects, and tolerance of medication regimen.  Therapeutic Interventions: 1 to 1 sessions, Unit Group sessions and Medication administration.  Evaluation of Outcomes: Not Met   RN Treatment Plan for Primary Diagnosis: MDD, recurrent, severe, without psychosis Long Term Goal(s): Knowledge of disease and therapeutic regimen to maintain health will improve  Short Term Goals: Ability to remain free from injury will improve, Ability to demonstrate self-control and Ability to disclose and discuss suicidal ideas  Medication Management: RN will administer medications as ordered by provider, will assess and evaluate patient's response and provide education to patient for prescribed medication. RN will report any adverse and/or side effects to prescribing provider.  Therapeutic Interventions: 1 on 1 counseling sessions, Psychoeducation, Medication administration, Evaluate responses to treatment, Monitor vital signs and CBGs as ordered, Perform/monitor CIWA, COWS, AIMS and Fall Risk screenings as ordered, Perform wound care treatments as ordered.  Evaluation of Outcomes: Not Met   LCSW Treatment Plan for Primary Diagnosis: MDD, recurrent, severe, without psychosis Long Term Goal(s): Safe transition to appropriate next level of care at discharge, Engage patient in therapeutic group addressing interpersonal concerns.  Short Term Goals: Engage patient in aftercare planning with referrals and resources, Facilitate patient progression through stages of change regarding substance use diagnoses and concerns and Identify triggers  associated with mental health/substance abuse issues  Therapeutic Interventions: Assess for all discharge needs, 1 to 1 time with Social worker, Explore available resources and support systems, Assess for adequacy in community support network, Educate family and significant other(s) on suicide prevention, Complete Psychosocial Assessment, Interpersonal group therapy.  Evaluation of Outcomes: Not Met   Progress in Treatment: Attending groups: No. New to unit. Continuing to assess.  Participating in groups: No. Taking medication as prescribed: Yes. Toleration medication: Yes. Family/Significant other contact made: No, will contact:  family member if patient consents to collateral contact.  Patient understands diagnosis: Yes. Discussing patient identified problems/goals with staff: Yes. Medical problems stabilized or resolved: Yes. Denies suicidal/homicidal ideation: Yes. Issues/concerns per patient self-inventory: No. Other: n/a   New problem(s) identified: No, Describe:  n/a  New Short Term/Long Term Goal(s): detox, medication management for mood stabilization; elimination of SI thoughts; development of comprehensive mental wellness/sobriety plan.   Patient Goal: "to safely detox from klonopin and get on the right psychiatric medications."  Discharge Plan or Barriers: CSW assessing for appropriate referrals. P lives with [facility]. [place] pamphlet and Mobile Crisis information provided for additional community support.   Reason for Continuation of Hospitalization: Anxiety Depression Hallucinations Medication stabilization  Estimated Length of Stay:  Monday, 07/24/17  Attendees: Patient: Nicholl Onstott 07/19/2017 8:34 AM  Physician: Dr. Nancy Fetter MD; Dr. Parke Poisson MD 07/19/2017 8:34 AM  Nursing: Estill Bamberg RN; [place] RN 07/19/2017 8:34 AM  RN Care Manager:x 07/19/2017 8:34 AM  Social Worker: Maxie Better, LCSW 07/19/2017 8:34 AM  Recreational Therapist: x 07/19/2017 8:34 AM  Other: Lindell Spar NP 07/19/2017 8:34 AM  Other:  07/19/2017 8:34 AM  Other: 07/19/2017 8:34 AM    Scribe for Treatment Team: [place], LCSW 07/19/2017 8:34 AM

## 2017-07-20 DIAGNOSIS — Z87891 Personal history of nicotine dependence: Secondary | ICD-10-CM

## 2017-07-20 MED ORDER — TRAZODONE HCL 100 MG PO TABS
100.0000 mg | ORAL_TABLET | Freq: Every evening | ORAL | Status: DC | PRN
  Administered 2017-07-20 – 2017-07-23 (×4): 100 mg via ORAL
  Filled 2017-07-20 (×3): qty 1

## 2017-07-20 MED ORDER — HYDROXYZINE HCL 50 MG PO TABS
50.0000 mg | ORAL_TABLET | Freq: Three times a day (TID) | ORAL | Status: DC | PRN
  Administered 2017-07-20 – 2017-07-21 (×2): 50 mg via ORAL
  Filled 2017-07-20 (×2): qty 1

## 2017-07-20 MED ORDER — QUETIAPINE FUMARATE 50 MG PO TABS
150.0000 mg | ORAL_TABLET | Freq: Every day | ORAL | Status: DC
  Administered 2017-07-20 – 2017-07-22 (×3): 150 mg via ORAL
  Filled 2017-07-20 (×4): qty 3

## 2017-07-20 NOTE — Progress Notes (Signed)
D: Pt was in dayroom upon initial approach.  Pt presents with anxious affect and mood.  She smiles with interaction.  She reports "I got no sleep last night."  Her goal is to sleep tonight.  Pt denies SI/HI, denies hallucinations, reports back pain of 3/10.  Pt has been visible in milieu interacting with peers and staff appropriately.     A: Actively listened to pt and provided support and encouragement.  Medications administered per order.  Fall prevention techniques reviewed with pt and pt verbalized understanding.  She reports she will inform staff of need for assistance prior to trying to get up if she feels weak or light-headed.  PRN medication administered for pain, anxiety, and sleep per request.  Q15 minute safety checks maintained.  R: Pt is safe on the unit.  Pt is compliant with medications and she reports "I think this cocktail will work" when medications were administered tonight.  Pt verbally contracts for safety and she reports she will inform staff of further needs and concerns.  Will continue to monitor and assess.

## 2017-07-20 NOTE — Progress Notes (Signed)
DAR NOTE: Patient presents with anxious affect and depressed mood. Pt has been visible in the milieu interacting with peers. Pt complained of not sleeping well last night even after taking 200 mg of trazodone and 100 mg of Seroquel. Complained of lower back pain 5/10. Report fair appetite, low energy level and poor concentration. Denies auditory and visual hallucinations.  Rates depression at 5, hopelessness at 5, and anxiety at 8.  Maintained on routine safety checks.  Medications given as prescribed.  Support and encouragement offered as needed.  Attended group and participated.  States goal for today is "sleep." Patient observed socializing with peers in the dayroom.  Offered no complaint.

## 2017-07-20 NOTE — Plan of Care (Signed)
  Problem: Safety: Goal: Periods of time without injury will increase Outcome: Progressing Note:  Pt has not harmed self or others tonight.  She denies SI/HI and verbally contracts for safety.    

## 2017-07-20 NOTE — Progress Notes (Signed)
Pt did not attend this evening's AA group.

## 2017-07-20 NOTE — Progress Notes (Addendum)
[facility] MD Progress Note  07/20/2017 10:58 AM Crystal Duarte  MRN:  [id number] Subjective:  Patient reports she is feeling " maybe a little better than I did before I came", but describes ongoing depression and anxiety. Denies suicidal ideations. Her major concern at this time is insomnia, and states she slept poorly again last night. She states she had slept better the night before on Seroquel.  Objective : I have discussed case with treatment team and have met with patient. 56 year old female, history of depression, anxiety, recent psychiatric admission at [facility] where she reports she was tapered off Klonopin , which she had been taking for years . States she was and remains motivated in tapering off BZDs and wants to continue management without BZDs.  Reports last BZD was 8 days ago. She states she feels Neurontin is helping her anxiety partially, and states Cymbalta has been an effective and well tolerated medication.  Currently no tremors, no diaphoresis, no restlessness or agitation, denies visual disturbances, vitals stable. As above, she ruminates about insomnia, and states this has been an issue since she stopped BZDs . States Seroquel/ Trazodone have been helpful but " still did not sleep almost at all last night". Staff report that patient presents anxious, somatically focused. Presents anxious, cooperative on approach.   Principal Problem: MDD (major depressive disorder), recurrent severe, without psychosis ([place]) Diagnosis:   Patient Active Problem List   Diagnosis Date Noted  . MDD (major depressive disorder), recurrent severe, without psychosis ([place]) [F33.2] 07/18/2017  . DOE (dyspnea on exertion) [R06.09] 12/26/2016  . COPD GOLD II  [J44.9] 12/26/2016  . Orthostatic hypotension [I95.1] 12/14/2016  . Chest tightness [R07.89] 12/14/2016  . Weight loss [R63.4] 12/14/2016  . Routine general medical examination at a health care facility [Z00.00] 10/09/2015  . H/O vaginal dysplasia  [Z87.411] 09/15/2015  . Hx of adenomatous polyp of colon [Z86.010] 12/04/2014  . Memory loss [R41.3] 10/04/2014  . Affective bipolar disorder ([place]) [F31.9] 03/12/2014  . Peripheral vascular disease, unspecified ([place]) [I73.9] 10/10/2013  . Smoker [F17.200] 08/28/2012  . Menopause [Z78.0] 08/28/2012  . Multiple pulmonary nodules [R91.8] 11/12/2010   Total Time spent with patient: 20 minutes  Past Medical History:  Past Medical History:  Diagnosis Date  . Coccidioidomycosis, pulmonary ([place])   . Depression   . Hx of adenomatous polyp of colon 12/04/2014  . Osteopenia 11/2016   T score -2.2 FRAX 17%/2.4%  . Panic attack     Past Surgical History:  Procedure Laterality Date  . ABDOMINAL HYSTERECTOMY    . COLONOSCOPY  2001   hemorrhoidectomy  . LUNG SURGERY  2001   VATS surgery   . OVARIAN CYST REMOVAL  1970's  . VESICOVAGINAL FISTULA CLOSURE W/ TAH  2005   Family History:  Family History  Problem Relation Age of Onset  . Asthma Mother   . Ovarian cancer Mother   . Cancer Mother        OVARIAN  . Varicose Veins Mother   . Heart disease Father   . Peripheral vascular disease Father   . Cancer Maternal Grandmother        LUNG- LUNG  . Colon cancer Neg Hx    Social History:  Social History   Substance and Sexual Activity  Alcohol Use No  . Alcohol/week: 0.0 oz     Social History   Substance and Sexual Activity  Drug Use No    Social History   Socioeconomic History  . Marital status: Divorced  Spouse name: Not on file  . Number of children: 2  . Years of education: Not on file  . Highest education level: Not on file  Occupational History  . Occupation: Forensic psychologist: [employer]  . Financial resource strain: Not on file  . Food insecurity:    Worry: Not on file    Inability: Not on file  . Transportation needs:    Medical: Not on file    Non-medical: Not on file  Tobacco Use  . Smoking status: Former Smoker     Packs/day: 1.00    Years: 42.00    Pack years: 42.00    Types: Cigarettes    Last attempt to quit: 09/25/2016    Years since quitting: 0.8  . Smokeless tobacco: Former Systems developer    Quit date: 06/13/2016  Substance and Sexual Activity  . Alcohol use: No    Alcohol/week: 0.0 oz  . Drug use: No  . Sexual activity: Not Currently    Birth control/protection: Surgical  Lifestyle  . Physical activity:    Days per week: Not on file    Minutes per session: Not on file  . Stress: Not on file  Relationships  . Social connections:    Talks on phone: Not on file    Gets together: Not on file    Attends religious service: Not on file    Active member of club or organization: Not on file    Attends meetings of clubs or organizations: Not on file    Relationship status: Not on file  Other Topics Concern  . Not on file  Social History Narrative  . Not on file   Additional Social History:    Sleep: Poor  Appetite:  Fair  Current Medications: Current Facility-Administered Medications  Medication Dose Route Frequency Provider Last Rate Last Dose  . acetaminophen (TYLENOL) tablet 650 mg  650 mg Oral Q6H PRN Nanci Pina, FNP      . alum & mag hydroxide-simeth (MAALOX/MYLANTA) 200-200-20 MG/5ML suspension 30 mL  30 mL Oral Q4H PRN Starkes, Takia S, FNP      . busPIRone (BUSPAR) tablet 15 mg  15 mg Oral TID Ethelene Hal, NP   15 mg at 07/20/17 0820  . DULoxetine (CYMBALTA) DR capsule 90 mg  90 mg Oral Daily Ethelene Hal, NP   90 mg at 07/20/17 0820  . gabapentin (NEURONTIN) capsule 300 mg  300 mg Oral TID Cobos, Myer Peer, MD   300 mg at 07/20/17 0820  . hydrOXYzine (ATARAX/VISTARIL) tablet 50 mg  50 mg Oral TID Patrecia Pour, NP   50 mg at 07/20/17 0820  . magnesium hydroxide (MILK OF MAGNESIA) suspension 30 mL  30 mL Oral Daily PRN Nanci Pina, FNP   30 mL at 07/20/17 0821  . QUEtiapine (SEROQUEL) tablet 150 mg  150 mg Oral QHS Cobos, Myer Peer, MD      . traZODone  (DESYREL) tablet 100 mg  100 mg Oral QHS PRN Cobos, Myer Peer, MD        Lab Results: No results found for this or any previous visit (from the past 48 hour(s)).  Blood Alcohol level:  Lab Results  Component Value Date   [facility] <10 07/17/2017   ETH <11 27/78/2423    Metabolic Disorder Labs: Lab Results  Component Value Date   HGBA1C 6.1 07/28/2016   MPG 114 08/28/2012   No results found for: PROLACTIN Lab  Results  Component Value Date   CHOL 205 (H) 07/28/2016   TRIG (H) 07/28/2016    412.0 Triglyceride is over 400; calculations on Lipids are invalid.   HDL 51.20 07/28/2016   CHOLHDL 4 07/28/2016   VLDL 33.6 10/08/2015   LDLCALC 119 (H) 10/08/2015   LDLCALC 128 09/15/2015    Physical Findings: AIMS: Facial and Oral Movements Muscles of Facial Expression: None, normal Lips and Perioral Area: None, normal Jaw: None, normal Tongue: None, normal,Extremity Movements Upper (arms, wrists, hands, fingers): None, normal Lower (legs, knees, ankles, toes): None, normal, Trunk Movements Neck, shoulders, hips: None, normal, Overall Severity Severity of abnormal movements (highest score from questions above): None, normal Incapacitation due to abnormal movements: None, normal Patient's awareness of abnormal movements (rate only patient's report): No Awareness, Dental Status Current problems with teeth and/or dentures?: No Does patient usually wear dentures?: No  CIWA:  CIWA-Ar Total: 6 COWS:  COWS Total Score: 4  Musculoskeletal: Strength & Muscle Tone: within normal limits Gait & Station: normal Patient leans: N/A  Psychiatric Specialty Exam: Physical Exam  ROS denies chest pain, no shortness of breath, no vomiting  Blood pressure 106/62, pulse (!) 109, temperature 98 F (36.7 C), temperature source Oral, resp. rate 18, height _0  (1.651 m), weight 77.1 kg (170 lb), SpO2 100 %.Body mass index is 28.29 kg/m.  General Appearance: Fairly Groomed  Eye Contact:  Good   Speech:  Normal Rate  Volume:  Normal  Mood:  depressed, anxious, states she is feeling better than prior to admission  Affect:  anxious, constricted, does smile briefly at times   Thought Process:  Linear and Descriptions of Associations: Intact  Orientation:  Full (Time, Place, and Person)  Thought Content:  denies hallucinations, no delusions, not internally preoccupied  had reported vague AH, described as "  mumbling ", but states this now resolved.   Suicidal Thoughts:  No denies suicidal or self injurious  ideations, no homicidal or violent ideations  Homicidal Thoughts:  No  Memory:  recent and remote grossly intact   Judgement:  Fair  Insight:  Fair  Psychomotor Activity:  Normal- no tremors, no diaphoresis, no psychomotor agitation  Concentration:  Concentration: Good and Attention Span: Good  Recall:  Good  Fund of Knowledge:  Good  Language:  Good  Akathisia:  Negative  Handed:  Right  AIMS (if indicated):     Assets:  Communication Skills Desire for Improvement Resilience  ADL's:  Intact  Cognition:  WNL  Sleep:  Number of Hours: 3   Assessment - patient reports partially improved depression, denies SI. Describes ongoing significant anxiety and in particular ruminates about insomnia. She reports she completed BZD detox and has now been off BZDs x 1 week- no current symptoms of BZD WDL, although insomnia/increased anxiety may be related to rebound symptomatology. She remains  adamant she does not want to restart or go back on BZDs .  Treatment Plan Summary: Daily contact with patient to assess and evaluate symptoms and progress in treatment, Medication management, Plan inpatient treatment and medications as below Encourage group and milieu participation to work on coping skills and symptom reduction Continue Cymbalta 90 mgrs QDAY for depression, anxiety Continue Neurontin 300 mgrs TID for anxiety  Continue Buspar 15 mgrs TID for anxiety Increase Seroquel to 150 mgrs  QHS for mood disorder, anxiety, insomnia, halls  Continue Trazodone 100 mgrs QHS PRN for insomnia as needed Continue Vistaril 50 mgrs TID PRN for anxiety as needed  Will check HgbA1C, Lipid Panel Treatment team working on disposition planning options  Jenne Campus, MD 07/20/2017, 10:58 AM

## 2017-07-20 NOTE — [facility] Group Notes (Signed)
LCSW Group Therapy Note   07/20/2017 1:15pm   Type of Therapy and Topic:  Group Therapy:  Overcoming Obstacles   Participation Level:  Active   Description of Group:    In this group patients will be encouraged to explore what they see as obstacles to their own wellness and recovery. They will be guided to discuss their thoughts, feelings, and behaviors related to these obstacles. The group will process together ways to cope with barriers, with attention given to specific choices patients can make. Each patient will be challenged to identify changes they are motivated to make in order to overcome their obstacles. This group will be process-oriented, with patients participating in exploration of their own experiences as well as giving and receiving support and challenge from other group members.   Therapeutic Goals: 1. Patient will identify personal and current obstacles as they relate to admission. 2. Patient will identify barriers that currently interfere with their wellness or overcoming obstacles.  3. Patient will identify feelings, thought process and behaviors related to these barriers. 4. Patient will identify two changes they are willing to make to overcome these obstacles:      Summary of Patient Progress   Crystal Duarte was attentive and engaged during today's processing group. She shared that her biggest obstacle involves being able to go to sleep and stay asleep. "The doctor fixed my sleep medication dosage so I'm hopeful I will get some sleep tonight." Crystal Duarte plans to discharge home and followup with her current psychiatrist and therapist. "I really like the support groups at the [facility] downstairs and will continue that as well." She continues to show progress in the group setting with improving insight.    Therapeutic Modalities:   Cognitive Behavioral Therapy Solution Focused Therapy Motivational Interviewing Relapse Prevention Therapy  Crystal Relic Smart,  LCSW 07/20/2017 12:15 PM

## 2017-07-20 NOTE — Progress Notes (Signed)
Pt refused labs this morning.  She states "I'm just not feeling up to it today.  She can't wait 'til tomorrow.  I've been up all night by the way."  Pt denies needs at this time.

## 2017-07-21 LAB — HEMOGLOBIN A1C
Hgb A1c MFr Bld: 5.4 % (ref 4.8–5.6)
Mean Plasma Glucose: 108.28 mg/dL

## 2017-07-21 LAB — LIPID PANEL
Cholesterol: 200 mg/dL (ref 0–200)
HDL: 54 mg/dL (ref >40)
LDL Cholesterol: 104 mg/dL — ABNORMAL HIGH (ref 0–99)
Total CHOL/HDL Ratio: 3.7 RATIO
Triglycerides: 208 mg/dL — ABNORMAL HIGH (ref <150)
VLDL: 42 mg/dL — ABNORMAL HIGH (ref 0–40)

## 2017-07-21 NOTE — Progress Notes (Signed)
Recreation Therapy Notes  Date: 4.19.19 Time: 9:30 a.m. Location: [address] Dayroom   Group Topic: Stress Management   Goal Area(s) Addresses:  Goal 1.1: To reduce stress  -Patient will feel a reduction in stress level  -Patient will understand the importance of stress management  -Patient will participate during stress management group      Intervention: Stress Management  Activity: Meditation: Patients were in a peaceful environment with soft lighting enhancing patients mood. Patients listened to a body scan meditation from the calm app to help release tension and stress.  Education: Stress Management, Discharge Planning.    Education Outcome: Acknowledges edcuation/In group clarification offered/Needs additional education   Clinical Observations/Feedback:: Patient did not attend     Ranell Patrick, Recreation Therapy Intern   Ranell Patrick 07/21/2017 9:25 AM

## 2017-07-21 NOTE — Progress Notes (Signed)
Pt complained of dizziness and chest pain radiating from right side towards the neck. VS taken and EKG done. Doctor made aware.

## 2017-07-21 NOTE — Progress Notes (Signed)
Pt did not attend AA meeting 

## 2017-07-21 NOTE — [facility] Group Notes (Signed)
LCSW Group Therapy Note  07/21/2017 1:15pm  Type of Therapy and Topic:  Group Therapy:  Feelings around Relapse and Recovery  Participation Level:  Did Not Attend--pt chose to remain in bed.    Description of Group:    Patients in this group will discuss emotions they experience before and after a relapse. They will process how experiencing these feelings, or avoidance of experiencing them, relates to having a relapse. Facilitator will guide patients to explore emotions they have related to recovery. Patients will be encouraged to process which emotions are more powerful. They will be guided to discuss the emotional reaction significant others in their lives may have to their relapse or recovery. Patients will be assisted in exploring ways to respond to the emotions of others without this contributing to a relapse.  Therapeutic Goals: 1. Patient will identify two or more emotions that lead to a relapse for them 2. Patient will identify two emotions that result when they relapse 3. Patient will identify two emotions related to recovery 4. Patient will demonstrate ability to communicate their needs through discussion and/or role plays   Summary of Patient Progress:  x   Therapeutic Modalities:   Cognitive Behavioral Therapy Solution-Focused Therapy Assertiveness Training Relapse Prevention Therapy   Kimber Relic Smart, LCSW 07/21/2017 10:42 AM

## 2017-07-21 NOTE — Progress Notes (Signed)
DAR NOTE: Patient presents with anxious affect and depressed mood.  Pt has been observed in the day room with peers. Pt sated she slept well night after taking Trazodone. Pt look more bright this morning   Denies  auditory and visual hallucinations.  Rates depression at 2, hopelessness at 2, and anxiety at 5.  Maintained on routine safety checks.  Medications given as prescribed.  Support and encouragement offered as needed. Did not attended group. "to stop the withdrawal symptoms and catch up on some sleep.".  Patient observed socializing with peers in the dayroom.  Offered no complaint.

## 2017-07-21 NOTE — Progress Notes (Signed)
[facility] MD Progress Note  07/21/2017 3:13 PM Crystal Duarte  MRN:  [id number] Subjective: Patient reports ongoing anxiety.  Endorses depression , gradually improving , denies suicidal ideations. Remains somatically focused.  Objective : I have discussed case with treatment team and have met with patient. 57 year old female, history of depression, anxiety, recent psychiatric admission at [facility] where she was tapered off Klonopin , which she had been taking for years .Presents highly motivated in tapering off BZDs and staying off BZDs, which she felt she had become dependent on. Reports last  BZD was 8 days prior to her admission. Remains anxious, somatically focused, but acknowledges some improvement compared to how she felt prior to admission and states that she finally slept better last night.  Denies suicidal ideations. Worried that Vistaril PRN are either ineffective or possibly even causing paradoxical increased anxiety. We discussed options-she does not want to restart benzos, even on a as needed basis.  She does feel Neurontin has helped partially.  Presents anxious but more responsive to support, encouragement, and affect seems to improve during session She has been visible in day room, no disruptive behaviors on unit . Labs reviewed as below.    Principal Problem: MDD (major depressive disorder), recurrent severe, without psychosis ([place]) Diagnosis:   Patient Active Problem List   Diagnosis Date Noted  . MDD (major depressive disorder), recurrent severe, without psychosis ([place]) [F33.2] 07/18/2017  . DOE (dyspnea on exertion) [R06.09] 12/26/2016  . COPD GOLD II  [J44.9] 12/26/2016  . Orthostatic hypotension [I95.1] 12/14/2016  . Chest tightness [R07.89] 12/14/2016  . Weight loss [R63.4] 12/14/2016  . Routine general medical examination at a health care facility [Z00.00] 10/09/2015  . 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 Hx of adenomatous polyp of colon [Z86.010] 12/04/2014   . Memory loss [R41.3] 10/04/2014  . Affective bipolar disorder ([place]) [F31.9] 03/12/2014  . Peripheral vascular disease, unspecified ([place]) [I73.9] 10/10/2013  . Smoker [F17.200] 08/28/2012  . Menopause [Z78.0] 08/28/2012  . Multiple pulmonary nodules [R91.8] 11/12/2010   Total Time spent with patient: 20 minutes  Past Medical History:  Past Medical History:  Diagnosis Date  . Coccidioidomycosis, pulmonary ([place])   . Depression   . Hx of adenomatous polyp of colon 12/04/2014  . Osteopenia 11/2016   T score -2.2 FRAX 17%/2.4%  . Panic attack     Past Surgical History:  Procedure Laterality Date  . ABDOMINAL HYSTERECTOMY    . COLONOSCOPY  2001   hemorrhoidectomy  . LUNG SURGERY  2001   VATS surgery   . OVARIAN CYST REMOVAL  1970's  . VESICOVAGINAL FISTULA CLOSURE W/ TAH  2005   Family History:  Family History  Problem Relation Age of Onset  . Asthma Mother   . Ovarian cancer Mother   . Cancer Mother        OVARIAN  . Varicose Veins Mother   . Heart disease Father   . Peripheral vascular disease Father   . Cancer Maternal Grandmother        LUNG- LUNG  . Colon cancer Neg Hx    Social History:  Social History   Substance and Sexual Activity  Alcohol Use No  . Alcohol/week: 0.0 oz     Social History   Substance and Sexual Activity  Drug Use No    Social History   Socioeconomic History  . Marital status: Divorced    Spouse name: Not on file  . Number of children: 2  . Years of education:  Not on file  . Highest education level: Not on file  Occupational History  . Occupation: Forensic psychologist: [employer]  . Financial resource strain: Not on file  . Food insecurity:    Worry: Not on file    Inability: Not on file  . Transportation needs:    Medical: Not on file    Non-medical: Not on file  Tobacco Use  . Smoking status: Former Smoker    Packs/day: 1.00    Years: 42.00    Pack years: 42.00    Types: Cigarettes    Last  attempt to quit: 09/25/2016    Years since quitting: 0.8  . Smokeless tobacco: Former Systems developer    Quit date: 06/13/2016  Substance and Sexual Activity  . Alcohol use: No    Alcohol/week: 0.0 oz  . Drug use: No  . Sexual activity: Not Currently    Birth control/protection: Surgical  Lifestyle  . Physical activity:    Days per week: Not on file    Minutes per session: Not on file  . Stress: Not on file  Relationships  . Social connections:    Talks on phone: Not on file    Gets together: Not on file    Attends religious service: Not on file    Active member of club or organization: Not on file    Attends meetings of clubs or organizations: Not on file    Relationship status: Not on file  Other Topics Concern  . Not on file  Social History Narrative  . Not on file   Additional Social History:    Sleep: Improving  Appetite:  Fair but improving  Current Medications: Current Facility-Administered Medications  Medication Dose Route Frequency Provider Last Rate Last Dose  . acetaminophen (TYLENOL) tablet 650 mg  650 mg Oral Q6H PRN Nanci Pina, FNP   650 mg at 07/21/17 1344  . alum & mag hydroxide-simeth (MAALOX/MYLANTA) 200-200-20 MG/5ML suspension 30 mL  30 mL Oral Q4H PRN Starkes, Takia S, FNP      . busPIRone (BUSPAR) tablet 15 mg  15 mg Oral TID Ethelene Hal, NP   15 mg at 07/21/17 1207  . DULoxetine (CYMBALTA) DR capsule 90 mg  90 mg Oral Daily Ethelene Hal, NP   90 mg at 07/21/17 5573  . gabapentin (NEURONTIN) capsule 300 mg  300 mg Oral TID Cobos, Myer Peer, MD   300 mg at 07/21/17 1207  . hydrOXYzine (ATARAX/VISTARIL) tablet 50 mg  50 mg Oral Q8H PRN Cobos, Myer Peer, MD   50 mg at 07/21/17 1209  . magnesium hydroxide (MILK OF MAGNESIA) suspension 30 mL  30 mL Oral Daily PRN Nanci Pina, FNP   30 mL at 07/20/17 0821  . QUEtiapine (SEROQUEL) tablet 150 mg  150 mg Oral QHS Cobos, Myer Peer, MD   150 mg at 07/20/17 2112  . traZODone (DESYREL) tablet  100 mg  100 mg Oral QHS PRN Cobos, Myer Peer, MD   100 mg at 07/20/17 2112    Lab Results:  Results for orders placed or performed during the hospital encounter of 07/18/17 (from the past 48 hour(s))  Hemoglobin A1c     Status: None   Collection Time: 07/21/17  6:33 AM  Result Value Ref Range   Hgb A1c MFr Bld 5.4 4.8 - 5.6 %    Comment: (NOTE) Pre diabetes:          5.7%-6.4% Diabetes:              >  6.4% Glycemic control for   <7.0% adults with diabetes    Mean Plasma Glucose 108.28 mg/dL    Comment: Performed at [facility] [address], [place], [place] [address]  Lipid panel     Status: Abnormal   Collection Time: 07/21/17  6:33 AM  Result Value Ref Range   Cholesterol 200 0 - 200 mg/dL   Triglycerides 208 (H) <150 mg/dL   HDL 54 >40 mg/dL   Total CHOL/HDL Ratio 3.7 RATIO   VLDL 42 (H) 0 - 40 mg/dL   LDL Cholesterol 104 (H) 0 - 99 mg/dL    Comment:        Total Cholesterol/HDL:CHD Risk Coronary Heart Disease Risk Table                     Men   Women  1/2 Average Risk   3.4   3.3  Average Risk       5.0   4.4  2 X Average Risk   9.6   7.1  3 X Average Risk  23.4   11.0        Use the calculated Patient Ratio above and the CHD Risk Table to determine the patient's CHD Risk.        ATP III CLASSIFICATION (LDL):  <100     mg/dL   Optimal  100-129  mg/dL   Near or Above                    Optimal  130-159  mg/dL   Borderline  160-189  mg/dL   High  >190     mg/dL   Very High Performed at [facility] [address] [place], [place] [address]     Blood Alcohol level:  Lab Results  Component Value Date   [facility] <10 07/17/2017   ETH <11 32/20/2542    Metabolic Disorder Labs: Lab Results  Component Value Date   HGBA1C 5.4 07/21/2017   MPG 108.28 07/21/2017   MPG 114 08/28/2012   No results found for: PROLACTIN Lab Results  Component Value Date   CHOL 200 07/21/2017   TRIG 208 (H) 07/21/2017   HDL 54 07/21/2017   CHOLHDL  3.7 07/21/2017   VLDL 42 (H) 07/21/2017   LDLCALC 104 (H) 07/21/2017   LDLCALC 119 (H) 10/08/2015    Physical Findings: AIMS: Facial and Oral Movements Muscles of Facial Expression: None, normal Lips and Perioral Area: None, normal Jaw: None, normal Tongue: None, normal,Extremity Movements Upper (arms, wrists, hands, fingers): None, normal Lower (legs, knees, ankles, toes): None, normal, Trunk Movements Neck, shoulders, hips: None, normal, Overall Severity Severity of abnormal movements (highest score from questions above): None, normal Incapacitation due to abnormal movements: None, normal Patient's awareness of abnormal movements (rate only patient's report): No Awareness, Dental Status Current problems with teeth and/or dentures?: No Does patient usually wear dentures?: No  CIWA:  CIWA-Ar Total: 6 COWS:  COWS Total Score: 4  Musculoskeletal: Strength & Muscle Tone: within normal limits, mild distal tremors, no diaphoresis,no psychomotor restlessness or agitation Gait & Station: normal Patient leans: N/A  Psychiatric Specialty Exam: Physical Exam  ROS reports pain on the right shoulder and right side of her body which she attributes to uncomfortable bed ,denies chest pain, no shortness of breath, no vomiting  Blood pressure (!) 141/87, pulse 99, temperature 98.6 F (37 C), temperature source Oral, resp. rate 16, height '5\' 5"'$  (1.651 m), weight 77.1  kg (170 lb), SpO2 100 %.Body mass index is 28.29 kg/m.  General Appearance: Well Groomed  Eye Contact:  Good  Speech:  Normal Rate  Volume:  Normal  Mood:  Remains depressed, but presents with partial improvement  Affect:  Anxious, but more reactive  Thought Process:  Linear and Descriptions of Associations: Intact  Orientation:  Full (Time, Place, and Person)  Thought Content:  denies hallucinations, no delusions, not internally preoccupied  -Denies any current auditory hallucinations  Suicidal Thoughts:  No denies suicidal or  self injurious  ideations, no homicidal or violent ideations  Homicidal Thoughts:  No  Memory:  recent and remote grossly intact   Judgement:  Fair-improving   Insight:  Fair-improving  Psychomotor Activity:  Normal- no tremors, no diaphoresis, no psychomotor agitation  Concentration:  Concentration: Good and Attention Span: Good  Recall:  Good  Fund of Knowledge:  Good  Language:  Good  Akathisia:  Negative  Handed:  Right  AIMS (if indicated):     Assets:  Communication Skills Desire for Improvement Resilience  ADL's:  Intact  Cognition:  WNL  Sleep:  Number of Hours: 6.75   Assessment -patient remains anxious, worried, somatically focused.  Mood seems partially improved and affect becoming more reactive.  No suicidal ideations. States that she slept better last night which is significant as insomnia has been a major complaint. Remains very motivated in being managed without benzodiazepines.  Of note, expresses concern that Vistaril PRN's may be increasing anxiety rather than treating it.   Treatment Plan Summary: Daily contact with patient to assess and evaluate symptoms and progress in treatment, Medication management, Plan inpatient treatment and medications as below  Treatment plan reviewed as below today 4/19 Encourage group and milieu participation to work on coping skills and symptom reduction Continue Cymbalta 90 mgrs QDAY for depression, anxiety Increase Neurontin to 400  mgrs TID for anxiety  Continue Buspar 15 mgrs TID for anxiety Continue Seroquel to 150 mgrs QHS for mood disorder, anxiety, insomnia, halls  Continue Trazodone 100 mgrs QHS PRN for insomnia as needed D/C Vistaril- see above Treatment team working on disposition planning options  Jenne Campus, MD 07/21/2017, 3:13 PM   Patient ID: Gardiner Ramus, female   DOB: May 18, 1960, 57 y.o.   MRN: [id number]

## 2017-07-21 NOTE — Progress Notes (Signed)
Patient ID: Crystal Duarte, female   DOB: 02/25/1961, 57 y.o.   MRN: [id number]  Pt currently presents with a depressed affect and guarded behavior. Pt reports to writer that their goal is to "not be in pain and get back to my old self." Reports ongoing shoulder pain that she says is due to muscle pain associated with withdrawal. States "When I was taking my Klonopin I was completely numb to everything (including pain)." Pt reports ongoing hopelessness. Pt reports good sleep with current medication regimen.   Pt provided with medications per providers orders. Pt's labs and vitals were monitored throughout the night. Pt given a 1:1 about emotional and mental status. Pt supported and encouraged to express concerns and questions. Pt educated on medications and the physiology of withdrawal.   Pt's safety ensured with 15 minute and environmental checks. Pt currently denies SI/HI and A/V hallucinations. Pt verbally agrees to seek staff if SI/HI or A/VH occurs and to consult with staff before acting on any harmful thoughts. Will continue POC.

## 2017-07-22 DIAGNOSIS — M2550 Pain in unspecified joint: Secondary | ICD-10-CM

## 2017-07-22 MED ORDER — GABAPENTIN 400 MG PO CAPS
400.0000 mg | ORAL_CAPSULE | Freq: Once | ORAL | Status: AC
  Administered 2017-07-22: 400 mg via ORAL
  Filled 2017-07-22: qty 1

## 2017-07-22 MED ORDER — NAPROXEN 500 MG PO TABS
500.0000 mg | ORAL_TABLET | Freq: Two times a day (BID) | ORAL | Status: DC
  Administered 2017-07-22 – 2017-07-24 (×4): 500 mg via ORAL
  Filled 2017-07-22 (×8): qty 1

## 2017-07-22 MED ORDER — GABAPENTIN 400 MG PO CAPS
400.0000 mg | ORAL_CAPSULE | Freq: Three times a day (TID) | ORAL | Status: DC
  Administered 2017-07-22 – 2017-07-28 (×19): 400 mg via ORAL
  Filled 2017-07-22 (×25): qty 1

## 2017-07-22 NOTE — Progress Notes (Addendum)
[facility] MD Progress Note  07/22/2017 11:19 AM Crystal Duarte  MRN:  [id number]   Subjective:  Patient reports that she is still feeling anxious and some depression. She reports that she is still improving and "it feels like it is coming out of me." She denies any SI/HI/AVH and contracts for safety. She is reporting some right shoulder pain and would like some Naproxen, because that is what she takes at home. She reports that she hopes to improve her anxiety more.  Objective: Patient's chart and findings reviewed and discussed with treatment team. Patient is pleasant and cooperative. She appears depressed and flat. We discussed medication and she is more interested in increasing her Seroquel and would prefer to increase it instead of increasing the Cymbalta any more. She has been seen in the milieu and has been interacting appropriately.   Principal Problem: MDD (major depressive disorder), recurrent severe, without psychosis ([place]) Diagnosis:   Patient Active Problem List   Diagnosis Date Noted  . MDD (major depressive disorder), recurrent severe, without psychosis ([place]) [F33.2] 07/18/2017  . DOE (dyspnea on exertion) [R06.09] 12/26/2016  . COPD GOLD II  [J44.9] 12/26/2016  . Orthostatic hypotension [I95.1] 12/14/2016  . Chest tightness [R07.89] 12/14/2016  . Weight loss [R63.4] 12/14/2016  . Routine general medical examination at a health care facility [Z00.00] 10/09/2015  . 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 Hx of adenomatous polyp of colon [Z86.010] 12/04/2014  . Memory loss [R41.3] 10/04/2014  . Affective bipolar disorder ([place]) [F31.9] 03/12/2014  . Peripheral vascular disease, unspecified ([place]) [I73.9] 10/10/2013  . Smoker [F17.200] 08/28/2012  . Menopause [Z78.0] 08/28/2012  . Multiple pulmonary nodules [R91.8] 11/12/2010   Total Time spent with patient: 30 minutes  Past Psychiatric History: See H&P  Past Medical History:  Past Medical History:  Diagnosis Date  .  Coccidioidomycosis, pulmonary ([place])   . Depression   . Hx of adenomatous polyp of colon 12/04/2014  . Osteopenia 11/2016   T score -2.2 FRAX 17%/2.4%  . Panic attack     Past Surgical History:  Procedure Laterality Date  . ABDOMINAL HYSTERECTOMY    . COLONOSCOPY  2001   hemorrhoidectomy  . LUNG SURGERY  2001   VATS surgery   . OVARIAN CYST REMOVAL  1970's  . VESICOVAGINAL FISTULA CLOSURE W/ TAH  2005   Family History:  Family History  Problem Relation Age of Onset  . Asthma Mother   . Ovarian cancer Mother   . Cancer Mother        OVARIAN  . Varicose Veins Mother   . Heart disease Father   . Peripheral vascular disease Father   . Cancer Maternal Grandmother        LUNG- LUNG  . Colon cancer Neg Hx    Family Psychiatric  History: See H&P Social History:  Social History   Substance and Sexual Activity  Alcohol Use No  . Alcohol/week: 0.0 oz     Social History   Substance and Sexual Activity  Drug Use No    Social History   Socioeconomic History  . Marital status: Divorced    Spouse name: Not on file  . Number of children: 2  . Years of education: Not on file  . Highest education level: Not on file  Occupational History  . Occupation: Forensic psychologist: [employer]  . Financial resource strain: Not on file  . Food insecurity:    Worry: Not on file  Inability: Not on file  . Transportation needs:    Medical: Not on file    Non-medical: Not on file  Tobacco Use  . Smoking status: Former Smoker    Packs/day: 1.00    Years: 42.00    Pack years: 42.00    Types: Cigarettes    Last attempt to quit: 09/25/2016    Years since quitting: 0.8  . Smokeless tobacco: Former Systems developer    Quit date: 06/13/2016  Substance and Sexual Activity  . Alcohol use: No    Alcohol/week: 0.0 oz  . Drug use: No  . Sexual activity: Not Currently    Birth control/protection: Surgical  Lifestyle  . Physical activity:    Days per week: Not on file     Minutes per session: Not on file  . Stress: Not on file  Relationships  . Social connections:    Talks on phone: Not on file    Gets together: Not on file    Attends religious service: Not on file    Active member of club or organization: Not on file    Attends meetings of clubs or organizations: Not on file    Relationship status: Not on file  Other Topics Concern  . Not on file  Social History Narrative  . Not on file   Additional Social History:                         Sleep: Fair  Appetite:  Good  Current Medications: Current Facility-Administered Medications  Medication Dose Route Frequency Provider Last Rate Last Dose  . alum & mag hydroxide-simeth (MAALOX/MYLANTA) 200-200-20 MG/5ML suspension 30 mL  30 mL Oral Q4H PRN Starkes, Takia S, FNP      . busPIRone (BUSPAR) tablet 15 mg  15 mg Oral TID Ethelene Hal, NP   15 mg at 07/22/17 0837  . DULoxetine (CYMBALTA) DR capsule 90 mg  90 mg Oral Daily Ethelene Hal, NP   90 mg at 07/22/17 0836  . gabapentin (NEURONTIN) capsule 400 mg  400 mg Oral TID Money, Darnelle Maffucci B, FNP      . magnesium hydroxide (MILK OF MAGNESIA) suspension 30 mL  30 mL Oral Daily PRN Nanci Pina, FNP   30 mL at 07/22/17 0839  . naproxen (NAPROSYN) tablet 500 mg  500 mg Oral BID WC Money, Lowry Ram, FNP      . QUEtiapine (SEROQUEL) tablet 150 mg  150 mg Oral QHS Aubrionna Istre, Myer Peer, MD   150 mg at 07/21/17 2105  . traZODone (DESYREL) tablet 100 mg  100 mg Oral QHS PRN Lennart Gladish, Myer Peer, MD   100 mg at 07/21/17 2105    Lab Results:  Results for orders placed or performed during the hospital encounter of 07/18/17 (from the past 48 hour(s))  Hemoglobin A1c     Status: None   Collection Time: 07/21/17  6:33 AM  Result Value Ref Range   Hgb A1c MFr Bld 5.4 4.8 - 5.6 %    Comment: (NOTE) Pre diabetes:          5.7%-6.4% Diabetes:              >6.4% Glycemic control for   <7.0% adults with diabetes    Mean Plasma Glucose 108.28  mg/dL    Comment: Performed at [facility], [place] [address], [place],  [address]  Lipid panel     Status: Abnormal   Collection Time:  07/21/17  6:33 AM  Result Value Ref Range   Cholesterol 200 0 - 200 mg/dL   Triglycerides 208 (H) <150 mg/dL   HDL 54 >40 mg/dL   Total CHOL/HDL Ratio 3.7 RATIO   VLDL 42 (H) 0 - 40 mg/dL   LDL Cholesterol 104 (H) 0 - 99 mg/dL    Comment:        Total Cholesterol/HDL:CHD Risk Coronary Heart Disease Risk Table                     Men   Women  1/2 Average Risk   3.4   3.3  Average Risk       5.0   4.4  2 X Average Risk   9.6   7.1  3 X Average Risk  23.4   11.0        Use the calculated Patient Ratio above and the CHD Risk Table to determine the patient's CHD Risk.        ATP III CLASSIFICATION (LDL):  <100     mg/dL   Optimal  100-129  mg/dL   Near or Above                    Optimal  130-159  mg/dL   Borderline  160-189  mg/dL   High  >190     mg/dL   Very High Performed at [facility] [address], [place], [place] [address]     Blood Alcohol level:  Lab Results  Component Value Date   [facility] <10 07/17/2017   ETH <11 15/17/6160    Metabolic Disorder Labs: Lab Results  Component Value Date   HGBA1C 5.4 07/21/2017   MPG 108.28 07/21/2017   MPG 114 08/28/2012   No results found for: PROLACTIN Lab Results  Component Value Date   CHOL 200 07/21/2017   TRIG 208 (H) 07/21/2017   HDL 54 07/21/2017   CHOLHDL 3.7 07/21/2017   VLDL 42 (H) 07/21/2017   LDLCALC 104 (H) 07/21/2017   LDLCALC 119 (H) 10/08/2015    Physical Findings: AIMS: Facial and Oral Movements Muscles of Facial Expression: None, normal Lips and Perioral Area: None, normal Jaw: None, normal Tongue: None, normal,Extremity Movements Upper (arms, wrists, hands, fingers): Minimal(tremors in hands) Lower (legs, knees, ankles, toes): None, normal, Trunk Movements Neck, shoulders, hips: None, normal, Overall Severity Severity of  abnormal movements (highest score from questions above): Minimal Incapacitation due to abnormal movements: None, normal Patient's awareness of abnormal movements (rate only patient's report): Aware, no distress, Dental Status Current problems with teeth and/or dentures?: No Does patient usually wear dentures?: No  CIWA:  CIWA-Ar Total: 6 COWS:  COWS Total Score: 4  Musculoskeletal: Strength & Muscle Tone: within normal limits Gait & Station: normal Patient leans: N/A  Psychiatric Specialty Exam: Physical Exam  Nursing note and vitals reviewed. Constitutional: She is oriented to person, place, and time. She appears well-developed and well-nourished.  Cardiovascular: Normal rate.  Respiratory: Effort normal.  Musculoskeletal: Normal range of motion.  Neurological: She is alert and oriented to person, place, and time.  Skin: Skin is warm.    Review of Systems  Constitutional: Negative.   HENT: Negative.   Eyes: Negative.   Respiratory: Negative.   Cardiovascular: Negative.   Gastrointestinal: Negative.   Genitourinary: Negative.   Musculoskeletal: Positive for joint pain.  Skin: Negative.   Neurological: Negative.   Endo/Heme/Allergies: Negative.   Psychiatric/Behavioral: Positive for depression. Negative for  hallucinations and suicidal ideas. The patient is nervous/anxious.     Blood pressure 115/78, pulse 84, temperature 98.5 F (36.9 C), temperature source Oral, resp. rate 18, height 5\' 5"  (1.651 m), weight 77.1 kg (170 lb), SpO2 99 %.Body mass index is 28.29 kg/m.  General Appearance: Casual  Eye Contact:  Good  Speech:  Clear and Coherent and Normal Rate  Volume:  Normal  Mood:  Anxious and Depressed  Affect:  Flat  Thought Process:  Goal Directed and Descriptions of Associations: Intact  Orientation:  Full (Time, Place, and Person)  Thought Content:  WDL  Suicidal Thoughts:  No  Homicidal Thoughts:  No  Memory:  Immediate;   Good Recent;   Good Remote;   Good   Judgement:  Fair  Insight:  Good  Psychomotor Activity:  Normal  Concentration:  Concentration: Good and Attention Span: Good  Recall:  Good  Fund of Knowledge:  Good  Language:  Good  Akathisia:  No  Handed:  Right  AIMS (if indicated):     Assets:  Communication Skills Desire for Improvement Financial Resources/Insurance Housing Physical Health Social Support Transportation  ADL's:  Intact  Cognition:  WNL  Sleep:  Number of Hours: 5.75   Problems Addressed: MDD severe  Treatment Plan Summary: Daily contact with patient to assess and evaluate symptoms and progress in treatment, Medication management and Plan is to:  -Continue Buspar 15 mg PO TID for anxiety -Continue Cymbalta 90 mg PO Daily for mood stability -Continue Neurontin 400 mg PO TID for anxiety -Continue Seroquel 150 mg PO QHS for mood stability -Continue Trazodone 100 mg PO QHS PRN for insomnia -Start Naproxen 500 mg PO BIDWC for pain -Stop Tylenol -Encourage group therapy participation  Lewis Shock, FNP 07/22/2017, 11:19 AM   ..Agree with NP Progress Note

## 2017-07-22 NOTE — Progress Notes (Signed)
Patient ID: Crystal Duarte, female   DOB: Apr 23, 1960, 57 y.o.   MRN: [id number]  Nursing Progress Note [id number]  I have reviewed the findings of Dorris Fetch, student nurse, and certify the findings to be true.  Baldo Daub

## 2017-07-22 NOTE — Progress Notes (Signed)
D: Pt slept in, came up to med window, and went back to lay in room.  Pt skipped breakfast.  Pt up at 9:30 and attended group.  Pt has been attending some groups.  Pt reported shoulder pain 1/10.  Pt reports constipation with last BM on 07/19/17.  Pt commplient with medications. A: Tylenol given or shoulder pain. When reassessed still 1/10.  Pt given milk of mag for constipation.  Pt given prescribed medications.  Pt reminded to seek staff for any needs.  Pt wearing non-skid footwear. R: 15 min safety checks.  Pt denies SI/HI and A/V hallucinations.  Will continue to monitor.

## 2017-07-22 NOTE — Progress Notes (Signed)
D.  Pt pleasant on approach, denies complaints at this time.  Pt did attend the first half of evening AA group.  Pt has otherwise remained in room reading.  Pt denies SI/HI/AVH at this time.  Pt continues to have some right shoulder discomfort but reports that Naproxen helps with this.  A.  Support and encouragement offered, medication given as ordered  R.  Pt remains safe on the unit, will continue to monitor.

## 2017-07-22 NOTE — [facility] Group Notes (Signed)
Pt was invited but did not attend orientation/goals group. 

## 2017-07-22 NOTE — [facility] Group Notes (Signed)
LCSW Group Therapy Note  07/22/2017    9:15-10:00AM  Type of Therapy and Topic:  Group Therapy: Anger Cues and Responses  Participation Level:  Active   Description of Group:   In this group, patients learned how to recognize the physical, cognitive, emotional, and behavioral responses they have to anger-provoking situations.  They identified a recent time they became angry and how they reacted.  They analyzed how their reaction was possibly beneficial and how it was possibly unhelpful.  The group discussed a variety of healthier coping skills that could help with such a situation in the future.  Deep breathing was practiced briefly.  Therapeutic Goals: 1. Patients will remember their last incident of anger and how they felt emotionally and physically, what their thoughts were at the time, and how they behaved. 2. Patients will identify how their behavior at that time worked for them, as well as how it worked against them. 3. Patients will explore possible new behaviors to use in future anger situations. 4. Patients will learn that anger itself is normal and cannot be eliminated, and that healthier reactions can assist with resolving conflict rather than worsening situations.  Summary of Patient Progress:  The patient shared that her most recent time of anger was when she was first hospitalized and said her anger was really self-directed "for allowing myself to get addicted."  She displayed some confusion when asked to talk about her thoughts and feelings during the anger episode, but was readily able to discuss her bodily reactions.  Therapeutic Modalities:   Cognitive Behavioral Therapy  Crystal Duarte  07/22/2017 12:00pm

## 2017-07-22 NOTE — Plan of Care (Signed)
Problem: Health Behavior/Discharge Planning: Goal: Compliance with treatment plan for underlying cause of condition will improve Intervention: Patient encouraged to take medications as prescribed and attend groups. Patient encouraged to be active in their recovery. Outcome: Patient is attending groups, taking medications as prescribed and complying with goals of treatment. 07/22/2017 11:12 AM - Progressing by Annia Friendly, RN   Problem: Safety: Goal: Periods of time without injury will increase Intervention: Patient contracts for safety on the unit. Low fall risk precautions in place. Safety monitored with q15 minute checks. Outcome: Patient remains safe on the unit at this time. 07/22/2017 11:12 AM - Progressing by Annia Friendly, RN

## 2017-07-22 NOTE — Progress Notes (Signed)
Adult Psychoeducational Group Note  Date:  07/22/2017 Time:  4:00 PM  Group Topic/Focus: Cognitive Distortion Managing Feelings:   The focus of this group is to identify what feelings patients have difficulty handling and develop a plan to handle them in a healthier way upon discharge.  Participation Level:  Minimal  Participation Quality:  Attentive  Affect:  Blunted  Cognitive:  Oriented  Insight: Improving  Engagement in Group:  Limited  Modes of Intervention:  Discussion and Education  Additional Comments:  Pt attended group but did not share and stayed through the whole group.  Dorris Fetch 07/22/2017, 5:05 PM

## 2017-07-22 NOTE — Progress Notes (Signed)
Patient did attend the first half of the evening speaker AA meeting. Pt left group and returned to her room.   

## 2017-07-23 MED ORDER — TRAZODONE HCL 100 MG PO TABS
100.0000 mg | ORAL_TABLET | Freq: Every evening | ORAL | Status: DC | PRN
  Administered 2017-07-24 – 2017-07-27 (×4): 100 mg via ORAL
  Filled 2017-07-23 (×4): qty 1

## 2017-07-23 MED ORDER — QUETIAPINE FUMARATE 25 MG PO TABS
25.0000 mg | ORAL_TABLET | Freq: Two times a day (BID) | ORAL | Status: DC | PRN
  Administered 2017-07-23 – 2017-07-26 (×5): 25 mg via ORAL
  Filled 2017-07-23 (×5): qty 1

## 2017-07-23 MED ORDER — QUETIAPINE FUMARATE 200 MG PO TABS
200.0000 mg | ORAL_TABLET | Freq: Every day | ORAL | Status: DC
  Administered 2017-07-23 – 2017-07-26 (×4): 200 mg via ORAL
  Filled 2017-07-23 (×5): qty 1

## 2017-07-23 NOTE — Progress Notes (Addendum)
[facility] MD Progress Note  07/23/2017 11:45 AM Crystal Duarte  MRN:  [id number]   Subjective:  Patient states that she did not sleep at all last night.  She reports that she feels irritable this morning.  She also states that due to her lack of sleep she still feels depressed and some anxiety.  She is requesting to increase her Seroquel to help with sleep as we discussed yesterday.  She denies any SI/HI/AVH and contracts for safety.  Objective: Patient's chart and findings reviewed and discussed with treatment team. Patient is pleasant and cooperative.  She presents in her room and she is straightening up her room.  She has a flat affect and does appear irritated.  She does communicate well still.  We will increase her Seroquel to 200 mg nightly and will add Seroquel 25 mg twice daily as needed for agitation and anxiety.  Principal Problem: MDD (major depressive disorder), recurrent severe, without psychosis ([place]) Diagnosis:   Patient Active Problem List   Diagnosis Date Noted  . MDD (major depressive disorder), recurrent severe, without psychosis ([place]) [F33.2] 07/18/2017  . DOE (dyspnea on exertion) [R06.09] 12/26/2016  . COPD GOLD II  [J44.9] 12/26/2016  . Orthostatic hypotension [I95.1] 12/14/2016  . Chest tightness [R07.89] 12/14/2016  . Weight loss [R63.4] 12/14/2016  . Routine general medical examination at a health care facility [Z00.00] 10/09/2015  . 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 Hx of adenomatous polyp of colon [Z86.010] 12/04/2014  . Memory loss [R41.3] 10/04/2014  . Affective bipolar disorder ([place]) [F31.9] 03/12/2014  . Peripheral vascular disease, unspecified ([place]) [I73.9] 10/10/2013  . Smoker [F17.200] 08/28/2012  . Menopause [Z78.0] 08/28/2012  . Multiple pulmonary nodules [R91.8] 11/12/2010   Total Time spent with patient: 30 minutes  Past Psychiatric History: See H&P  Past Medical History:  Past Medical History:  Diagnosis Date  . Coccidioidomycosis,  pulmonary ([place])   . Depression   . Hx of adenomatous polyp of colon 12/04/2014  . Osteopenia 11/2016   T score -2.2 FRAX 17%/2.4%  . Panic attack     Past Surgical History:  Procedure Laterality Date  . ABDOMINAL HYSTERECTOMY    . COLONOSCOPY  2001   hemorrhoidectomy  . LUNG SURGERY  2001   VATS surgery   . OVARIAN CYST REMOVAL  1970's  . VESICOVAGINAL FISTULA CLOSURE W/ TAH  2005   Family History:  Family History  Problem Relation Age of Onset  . Asthma Mother   . Ovarian cancer Mother   . Cancer Mother        OVARIAN  . Varicose Veins Mother   . Heart disease Father   . Peripheral vascular disease Father   . Cancer Maternal Grandmother        LUNG- LUNG  . Colon cancer Neg Hx    Family Psychiatric  History: See H&P Social History:  Social History   Substance and Sexual Activity  Alcohol Use No  . Alcohol/week: 0.0 oz     Social History   Substance and Sexual Activity  Drug Use No    Social History   Socioeconomic History  . Marital status: Divorced    Spouse name: Not on file  . Number of children: 2  . Years of education: Not on file  . Highest education level: Not on file  Occupational History  . Occupation: Forensic psychologist: [employer]  . Financial resource strain: Not on file  . Food insecurity:  Worry: Not on file    Inability: Not on file  . Transportation needs:    Medical: Not on file    Non-medical: Not on file  Tobacco Use  . Smoking status: Former Smoker    Packs/day: 1.00    Years: 42.00    Pack years: 42.00    Types: Cigarettes    Last attempt to quit: 09/25/2016    Years since quitting: 0.8  . Smokeless tobacco: Former Systems developer    Quit date: 06/13/2016  Substance and Sexual Activity  . Alcohol use: No    Alcohol/week: 0.0 oz  . Drug use: No  . Sexual activity: Not Currently    Birth control/protection: Surgical  Lifestyle  . Physical activity:    Days per week: Not on file    Minutes per session:  Not on file  . Stress: Not on file  Relationships  . Social connections:    Talks on phone: Not on file    Gets together: Not on file    Attends religious service: Not on file    Active member of club or organization: Not on file    Attends meetings of clubs or organizations: Not on file    Relationship status: Not on file  Other Topics Concern  . Not on file  Social History Narrative  . Not on file   Additional Social History:                         Sleep: Poor  Appetite:  Good  Current Medications: Current Facility-Administered Medications  Medication Dose Route Frequency Provider Last Rate Last Dose  . alum & mag hydroxide-simeth (MAALOX/MYLANTA) 200-200-20 MG/5ML suspension 30 mL  30 mL Oral Q4H PRN Starkes, Takia S, FNP      . busPIRone (BUSPAR) tablet 15 mg  15 mg Oral TID Ethelene Hal, NP   15 mg at 07/23/17 1003  . DULoxetine (CYMBALTA) DR capsule 90 mg  90 mg Oral Daily Ethelene Hal, NP   90 mg at 07/23/17 1002  . gabapentin (NEURONTIN) capsule 400 mg  400 mg Oral TID Money, Lowry Ram, FNP   400 mg at 07/23/17 1002  . magnesium hydroxide (MILK OF MAGNESIA) suspension 30 mL  30 mL Oral Daily PRN Nanci Pina, FNP   30 mL at 07/22/17 0839  . naproxen (NAPROSYN) tablet 500 mg  500 mg Oral BID WC Money, Darnelle Maffucci B, FNP   500 mg at 07/23/17 1003  . QUEtiapine (SEROQUEL) tablet 200 mg  200 mg Oral QHS Money, Darnelle Maffucci B, FNP      . traZODone (DESYREL) tablet 100 mg  100 mg Oral QHS PRN Massie Mees, Myer Peer, MD   100 mg at 07/22/17 2105    Lab Results:  No results found for this or any previous visit (from the past 48 hour(s)).  Blood Alcohol level:  Lab Results  Component Value Date   ETH <10 07/17/2017   ETH <11 34/19/3790    Metabolic Disorder Labs: Lab Results  Component Value Date   HGBA1C 5.4 07/21/2017   MPG 108.28 07/21/2017   MPG 114 08/28/2012   No results found for: PROLACTIN Lab Results  Component Value Date   CHOL 200  07/21/2017   TRIG 208 (H) 07/21/2017   HDL 54 07/21/2017   CHOLHDL 3.7 07/21/2017   VLDL 42 (H) 07/21/2017   LDLCALC 104 (H) 07/21/2017   LDLCALC 119 (H) 10/08/2015    Physical Findings:  AIMS: Facial and Oral Movements Muscles of Facial Expression: None, normal Lips and Perioral Area: None, normal Jaw: None, normal Tongue: None, normal,Extremity Movements Upper (arms, wrists, hands, fingers): None, normal Lower (legs, knees, ankles, toes): None, normal, Trunk Movements Neck, shoulders, hips: None, normal, Overall Severity Severity of abnormal movements (highest score from questions above): None, normal Incapacitation due to abnormal movements: None, normal Patient's awareness of abnormal movements (rate only patient's report): No Awareness, Dental Status Current problems with teeth and/or dentures?: No Does patient usually wear dentures?: No  CIWA:  CIWA-Ar Total: 6 COWS:  COWS Total Score: 4  Musculoskeletal: Strength & Muscle Tone: within normal limits Gait & Station: normal Patient leans: N/A  Psychiatric Specialty Exam: Physical Exam  Nursing note and vitals reviewed. Constitutional: She is oriented to person, place, and time. She appears well-developed and well-nourished.  Cardiovascular: Normal rate.  Respiratory: Effort normal.  Musculoskeletal: Normal range of motion.  Neurological: She is alert and oriented to person, place, and time.  Skin: Skin is warm.    Review of Systems  Constitutional: Negative.   HENT: Negative.   Eyes: Negative.   Respiratory: Negative.   Cardiovascular: Negative.   Gastrointestinal: Negative.   Genitourinary: Negative.   Musculoskeletal: Positive for joint pain (improving).  Skin: Negative.   Neurological: Negative.   Endo/Heme/Allergies: Negative.   Psychiatric/Behavioral: Positive for depression. Negative for hallucinations and suicidal ideas. The patient is nervous/anxious.     Blood pressure 134/82, pulse 64, temperature  98 F (36.7 C), temperature source Oral, resp. rate 16, height 5\' 5"  (1.651 m), weight 77.1 kg (170 lb), SpO2 99 %.Body mass index is 28.29 kg/m.  General Appearance: Casual  Eye Contact:  Good  Speech:  Clear and Coherent and Normal Rate  Volume:  Normal  Mood:  Anxious, Depressed and Irritable due to lack of sleep  Affect:  Flat  Thought Process:  Goal Directed and Descriptions of Associations: Intact  Orientation:  Full (Time, Place, and Person)  Thought Content:  WDL  Suicidal Thoughts:  No  Homicidal Thoughts:  No  Memory:  Immediate;   Good Recent;   Good Remote;   Good  Judgement:  Fair  Insight:  Good  Psychomotor Activity:  Normal  Concentration:  Concentration: Good and Attention Span: Good  Recall:  Good  Fund of Knowledge:  Good  Language:  Good  Akathisia:  No  Handed:  Right  AIMS (if indicated):     Assets:  Communication Skills Desire for Improvement Financial Resources/Insurance Housing Physical Health Social Support Transportation  ADL's:  Intact  Cognition:  WNL  Sleep:  Number of Hours: 3   Problems Addressed: MDD severe  Treatment Plan Summary: Daily contact with patient to assess and evaluate symptoms and progress in treatment, Medication management and Plan is to:  -Continue Buspar 15 mg PO TID for anxiety -Continue Cymbalta 90 mg PO Daily for mood stability -Continue Neurontin 400 mg PO TID for anxiety -Increase Seroquel 200 mg PO QHS for mood stability -Start Seroquel 25 mg PO BID PRN for agitation and anxiety -Continue Trazodone 100 mg PO QHS PRN for insomnia -Continue Naproxen 500 mg PO BIDWC for pain -Encourage group therapy participation  Lewis Shock, FNP 07/23/2017, 11:45 AM   ..Agree with NP Progress Note

## 2017-07-23 NOTE — [facility] Group Notes (Signed)
Pt was invited but did not attend orientation/goals group. 

## 2017-07-23 NOTE — Progress Notes (Signed)
Patient ID: Crystal Duarte, female   DOB: [birth year], 57 y.o.   MRN: [id number]  Nursing Progress Note [id number]  Data: Patient presents with anxious mood. Patient slept late this morning due to only sleep 3 hours during the night. Morning medications provided when patient woke up. Patient complaint with scheduled medications. Patient denies SI/HI/AVH or pain. Patient contracts for safety on the unit at this time. Patient completed self-inventory sheet and rates depression, hopelessness, and anxiety 5,3,0 respectively. Patient rates their sleep and appetite as poor/fair respectively. Patient states goal for today is to "feel better, physically and mentally" and "sleep". Patient is seen attending groups and visible in the milieu. Patient reports she would like her sleep medications increased. Patient reports PRN Seroquel was effective in "taking the edge off" today to her anxiety.  Action: Patient educated about and provided medication per provider's orders. Patient safety maintained with q15 min safety checks. Low fall risk precautions in place. Emotional support given. 1:1 interaction and active listening provided. Patient encouraged to attend meals and groups. Labs, vital signs and patient behavior monitored throughout shift. Patient encouraged to work on treatment plan and goals.  Response: Patient remains safe on the unit at this time. Patient is interacting with peers appropriately on the unit. Will continue to support and monitor.

## 2017-07-23 NOTE — Progress Notes (Signed)
D.  Pt pleasant on approach, in dayroom eating snacks.  Pt did not attend evening AA group.  Pt pleased with medication increase and hopeful for better night sleep tonight.  Pt denies SI/HI/AVH at this time.  Pt observed interacting appropriately with peers on the unit.  A.  Support and encouragement offered, medication given as ordered  R.  Pt remains safe on the unit, will continue to monitor.

## 2017-07-23 NOTE — Progress Notes (Signed)
Patient did not attend the evening speaker [place] meeting. Pt was notified that group was beginning and returned to her room until the dayroom reopened.

## 2017-07-23 NOTE — Plan of Care (Signed)
Problem: Activity: Goal: Interest or engagement in activities will improve Intervention: Patient has been provided a daily scheduled and is encouraged by staff to attend groups. Outcome: Patient is visible on the unit, interactive with peers/staff and attending groups. 07/23/2017 10:28 AM - Progressing by Annia Friendly, RN   Problem: Safety: Goal: Periods of time without injury will increase Intervention: Patient contracts for safety on the unit. Low fall risk precautions in place. Safety monitored with q15 minute checks. Outcome: Patient remains safe on the unit at this time. 07/23/2017 10:28 AM - Progressing by Annia Friendly, RN

## 2017-07-23 NOTE — BHH Group Notes (Signed)
[facility] Group Notes: (Clinical Social Work)   07/23/2017      Type of Therapy:  Group Therapy   Participation Level:  Did Not Attend despite MHT prompting   Selmer Dominion, LCSW 07/23/2017, 12:35 PM

## 2017-07-24 MED ORDER — DULOXETINE HCL 60 MG PO CPEP
60.0000 mg | ORAL_CAPSULE | Freq: Two times a day (BID) | ORAL | Status: DC
  Administered 2017-07-25 – 2017-07-28 (×7): 60 mg via ORAL
  Filled 2017-07-24 (×11): qty 1

## 2017-07-24 MED ORDER — NAPROXEN 500 MG PO TABS
500.0000 mg | ORAL_TABLET | Freq: Two times a day (BID) | ORAL | Status: DC | PRN
  Administered 2017-07-24 – 2017-07-27 (×5): 500 mg via ORAL
  Filled 2017-07-24 (×4): qty 1

## 2017-07-24 NOTE — Progress Notes (Signed)
Recreation Therapy Notes  Date: 07/24/17 Time: 0930 Location: [address] Dayroom  Group Topic: Stress Management  Goal Area(s) Addresses:  Patient will verbalize importance of using healthy stress management.  Patient will identify positive emotions associated with healthy stress management.   Intervention: Stress Management  Activity :  Meditation.  LRT played a meditation from the Calm app that allowed patients to relax and learn how to be resilient in the face of change.  Education:  Stress Management, Discharge Planning.   Education Outcome: Acknowledges edcuation/In group clarification offered/Needs additional education  Clinical Observations/Feedback: Pt did not attend group.    Victorino Sparrow, LRT/CTRS         Ria Comment, Ermine Spofford A 07/24/2017 11:01 AM

## 2017-07-24 NOTE — BHH Group Notes (Signed)
[place] LCSW Group Therapy Note  Date/Time: 07/24/17, 1315  Type of Therapy and Topic:  Group Therapy:  Overcoming Obstacles  Participation Level:  minimal  Description of Group:    In this group patients will be encouraged to explore what they see as obstacles to their own wellness and recovery. They will be guided to discuss their thoughts, feelings, and behaviors related to these obstacles. The group will process together ways to cope with barriers, with attention given to specific choices patients can make. Each patient will be challenged to identify changes they are motivated to make in order to overcome their obstacles. This group will be process-oriented, with patients participating in exploration of their own experiences as well as giving and receiving support and challenge from other group members.  Therapeutic Goals: 1. Patient will identify personal and current obstacles as they relate to admission. 2. Patient will identify barriers that currently interfere with their wellness or overcoming obstacles.  3. Patient will identify feelings, thought process and behaviors related to these barriers. 4. Patient will identify two changes they are willing to make to overcome these obstacles:    Summary of Patient Progress: Pt shared that withdrawals from [facility] and caring for her elderly mother are current obstacles in her life.  Pt was not very active in group discussion but did respond to CSW questions during group.      Therapeutic Modalities:   Cognitive Behavioral Therapy Solution Focused Therapy Motivational Interviewing Relapse Prevention Therapy  Lurline Idol, LCSW

## 2017-07-24 NOTE — Tx Team (Signed)
Interdisciplinary Treatment and Diagnostic Plan Update  07/24/2017 Time of Session: 1040AM Crystal Duarte MRN: [id number]  Principal Diagnosis: MDD, recurrent, severe, without psychosis  Secondary Diagnoses: Principal Problem:   MDD (major depressive disorder), recurrent severe, without psychosis ([place])   Current Medications:  Current Facility-Administered Medications  Medication Dose Route Frequency Provider Last Rate Last Dose  . alum & mag hydroxide-simeth (MAALOX/MYLANTA) 200-200-20 MG/5ML suspension 30 mL  30 mL Oral Q4H PRN Starkes, Takia S, FNP      . busPIRone (BUSPAR) tablet 15 mg  15 mg Oral TID Ethelene Hal, NP   15 mg at 07/24/17 1211  . [START ON 07/25/2017] DULoxetine (CYMBALTA) DR capsule 60 mg  60 mg Oral BID Cobos, Fernando A, MD      . gabapentin (NEURONTIN) capsule 400 mg  400 mg Oral TID Money, Lowry Ram, FNP   400 mg at 07/24/17 1211  . magnesium hydroxide (MILK OF MAGNESIA) suspension 30 mL  30 mL Oral Daily PRN Nanci Pina, FNP   30 mL at 07/22/17 0349  . naproxen (NAPROSYN) tablet 500 mg  500 mg Oral BID PRN Cobos, Myer Peer, MD      . QUEtiapine (SEROQUEL) tablet 200 mg  200 mg Oral QHS Money, Lowry Ram, FNP   200 mg at 07/23/17 2106  . QUEtiapine (SEROQUEL) tablet 25 mg  25 mg Oral BID PRN Money, Lowry Ram, FNP   25 mg at 07/24/17 1400  . traZODone (DESYREL) tablet 100 mg  100 mg Oral QHS PRN,MR X 1 Lindon Romp A, NP       PTA Medications: Medications Prior to Admission  Medication Sig Dispense Refill Last Dose  . acetaminophen (TYLENOL) 500 MG tablet Take 1,000 mg by mouth every 6 (six) hours as needed for headache.   07/16/2017 at Unknown time  . busPIRone (BUSPAR) 15 MG tablet Take 15 mg by mouth 3 (three) times daily.   07/17/2017 at Unknown time  . Calcium-Vitamin D (CALTRATE 600 PLUS-VIT D PO) Take 600 mg by mouth.   07/17/2017 at Unknown time  . cetirizine (ZYRTEC) 10 MG tablet Take 10 mg by mouth as needed for allergies.   Past Week at Unknown  time  . Cholecalciferol (VITAMIN D3 PO) Take 1,200 mg by mouth daily.   07/17/2017 at Unknown time  . clonazePAM (KLONOPIN) 1 MG tablet Take 1 mg by mouth 3 (three) times daily as needed for anxiety.    Not Taking at Unknown time  . DULoxetine (CYMBALTA) 30 MG capsule Take 90 mg by mouth daily. Pt takes a total dose of 90mg  of duloxetine. Either by taking three 30mg  or one 60mg  and one 30mg  capsule.   07/17/2017 at Unknown time  . fluticasone (FLONASE) 50 MCG/ACT nasal spray Place 2 sprays into both nostrils daily. 16 g 6 Past Week at Unknown time  . folic acid (FOLVITE) 1 MG tablet Take 1 mg by mouth daily.   07/17/2017 at Unknown time  . hydrOXYzine (VISTARIL) 25 MG capsule Take 25 mg by mouth as needed.   Past Week at Unknown time  . mometasone-formoterol (DULERA) 100-5 MCG/ACT AERO Inhale 2 puffs into the lungs 2 (two) times daily. 1 Inhaler 11 unknown  . Multiple Vitamin (MULTIVITAMIN) tablet Take 1 tablet by mouth daily. Reported on 10/08/2015   07/17/2017 at Unknown time  . naphazoline-pheniramine (NAPHCON-A) 0.025-0.3 % ophthalmic solution Place 2 drops into both eyes 4 (four) times daily as needed for eye irritation.   Past Week at Unknown  time  . QUEtiapine (SEROQUEL) 50 MG tablet Take 100 mg by mouth at bedtime.   07/16/2017 at Unknown time  . traZODone (DESYREL) 100 MG tablet Take 200 mg by mouth at bedtime.    07/17/2017 at Unknown time    Patient Stressors: Medication change or noncompliance Substance abuse  Patient Strengths: Curator fund of knowledge Motivation for treatment/growth  Treatment Modalities: Medication Management, Group therapy, Case management,  1 to 1 session with clinician, Psychoeducation, Recreational therapy.   Physician Treatment Plan for Primary Diagnosis: MDD, recurrent, severe, without psychosis  Medication Management: Evaluate patient's response, side effects, and tolerance of medication regimen.  Therapeutic Interventions: 1 to 1  sessions, Unit Group sessions and Medication administration.  Evaluation of Outcomes: Progressing  Physician Treatment Plan for Secondary Diagnosis: Principal Problem:   MDD (major depressive disorder), recurrent severe, without psychosis ([name])  Medication Management: Evaluate patient's response, side effects, and tolerance of medication regimen.  Therapeutic Interventions: 1 to 1 sessions, Unit Group sessions and Medication administration.  Evaluation of Outcomes: Progressing   RN Treatment Plan for Primary Diagnosis: MDD, recurrent, severe, without psychosis Long Term Goal(s): Knowledge of disease and therapeutic regimen to maintain health will improve  Short Term Goals: Ability to remain free from injury will improve, Ability to demonstrate self-control and Ability to disclose and discuss suicidal ideas  Medication Management: RN will administer medications as ordered by provider, will assess and evaluate patient's response and provide education to patient for prescribed medication. RN will report any adverse and/or side effects to prescribing provider.  Therapeutic Interventions: 1 on 1 counseling sessions, Psychoeducation, Medication administration, Evaluate responses to treatment, Monitor vital signs and CBGs as ordered, Perform/monitor CIWA, COWS, AIMS and Fall Risk screenings as ordered, Perform wound care treatments as ordered.  Evaluation of Outcomes: Progressing   LCSW Treatment Plan for Primary Diagnosis: MDD, recurrent, severe, without psychosis Long Term Goal(s): Safe transition to appropriate next level of care at discharge, Engage patient in therapeutic group addressing interpersonal concerns.  Short Term Goals: Engage patient in aftercare planning with referrals and resources, Facilitate patient progression through stages of change regarding substance use diagnoses and concerns and Identify triggers associated with mental health/substance abuse issues  Therapeutic  Interventions: Assess for all discharge needs, 1 to 1 time with Social worker, Explore available resources and support systems, Assess for adequacy in community support network, Educate family and significant other(s) on suicide prevention, Complete Psychosocial Assessment, Interpersonal group therapy.  Evaluation of Outcomes: Progressing   Progress in Treatment: Attending groups: Yes Participating in groups: Yes Taking medication as prescribed: Yes. Toleration medication: Yes. Family/Significant other contact made: Yes, individual(s) contacted:  son Patient understands diagnosis: Yes. Discussing patient identified problems/goals with staff: Yes. Medical problems stabilized or resolved: Yes. Denies suicidal/homicidal ideation: Yes. Issues/concerns per patient self-inventory: No. Other: n/a   New problem(s) identified: No, Describe:  n/a  New Short Term/Long Term Goal(s): detox, medication management for mood stabilization; elimination of SI thoughts; development of comprehensive mental wellness/sobriety plan.   Patient Goal: "to safely detox from klonopin and get on the right psychiatric medications."  Discharge Plan or Barriers: CSW assessing for appropriate referrals. P lives with [place]. [place] pamphlet and Mobile Crisis information provided for additional community support.   Reason for Continuation of Hospitalization: Anxiety Depression Hallucinations Medication stabilization  Estimated Length of Stay:  1-3 days.  Attendees: Patient: 07/24/2017   Physician: Dr. Parke Poisson, MD 07/24/2017   Nursing: Grayland Ormond, RN 07/24/2017   RN Care Manager: 07/24/2017  Social Worker: Lurline Idol, LCSW 07/24/2017   Recreational Therapist:  07/24/2017   Other:  07/24/2017   Other:  07/24/2017   Other: 07/24/2017        Scribe for Treatment Team: Joanne Chars, [place] 07/24/2017 2:50 PM

## 2017-07-24 NOTE — Progress Notes (Signed)
[facility] MD Progress Note  07/24/2017 1:15 PM Crystal Duarte  MRN:  [id number]   Subjective:  Patient reports partial improvement, states she was finally able to get some noticeably improved sleep. She describes ongoing depression, anxiety, but does endorse some improvement compared to admission presentation. She states that she saw some ants in her room yesterday ( this confirmed with staff- no current evidence of hallucination) and felt that she had ants on her skin .  Currently denies medication side effects. Denies suicidal ideations.   Objective:  I have discussed case with treatment team and have met with patient. 57 year old divorced female, lives with mother, reports long history of depression, anxiety, presented due to depression, anxiety, SI. Reports history of long term BZD ( Klonopin) management, which had been tapered off during a prior/recent psychiatric admission. Patient expresses high level of motivation in being off BZDs , states she felt she had become dependent. She has now been off BZDs x close to two weeks. She does not appear restless or agitated. No facial flushing . Vitals stable. As above,reports seeing ants yesterday, presence of ants in room confirmed , and thus not felt to be hallucinatory. Minimal distal tremors. States mood partially improved but still depressed . Denies SI. Denies medication side effects, but feels clinical response to medications has been partial thus far. Had reported insomnia as a major and severe issue - states now sleeping better and slept well last night. Currently not endorsing medication side effects.     Principal Problem: MDD (major depressive disorder), recurrent severe, without psychosis ([place]) Diagnosis:   Patient Active Problem List   Diagnosis Date Noted  . MDD (major depressive disorder), recurrent severe, without psychosis ([place]) [F33.2] 07/18/2017  . DOE (dyspnea on exertion) [R06.09] 12/26/2016  . COPD GOLD II  [J44.9] 12/26/2016  .  Orthostatic hypotension [I95.1] 12/14/2016  . Chest tightness [R07.89] 12/14/2016  . Weight loss [R63.4] 12/14/2016  . Routine general medical examination at a health care facility [Z00.00] 10/09/2015  . 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 Hx of adenomatous polyp of colon [Z86.010] 12/04/2014  . Memory loss [R41.3] 10/04/2014  . Affective bipolar disorder ([place]) [F31.9] 03/12/2014  . Peripheral vascular disease, unspecified ([place]) [I73.9] 10/10/2013  . Smoker [F17.200] 08/28/2012  . Menopause [Z78.0] 08/28/2012  . Multiple pulmonary nodules [R91.8] 11/12/2010   Total Time spent with patient: 20 minutes  Past Psychiatric History: See H&P  Past Medical History:  Past Medical History:  Diagnosis Date  . Coccidioidomycosis, pulmonary ([place])   . Depression   . Hx of adenomatous polyp of colon 12/04/2014  . Osteopenia 11/2016   T score -2.2 FRAX 17%/2.4%  . Panic attack     Past Surgical History:  Procedure Laterality Date  . ABDOMINAL HYSTERECTOMY    . COLONOSCOPY  2001   hemorrhoidectomy  . LUNG SURGERY  2001   VATS surgery   . OVARIAN CYST REMOVAL  1970's  . VESICOVAGINAL FISTULA CLOSURE W/ TAH  2005   Family History:  Family History  Problem Relation Age of Onset  . Asthma Mother   . Ovarian cancer Mother   . Cancer Mother        OVARIAN  . Varicose Veins Mother   . Heart disease Father   . Peripheral vascular disease Father   . Cancer Maternal Grandmother        LUNG- LUNG  . Colon cancer Neg Hx    Family Psychiatric  History: See H&P Social History:  Social History   Substance and Sexual Activity  Alcohol Use No  . Alcohol/week: 0.0 oz     Social History   Substance and Sexual Activity  Drug Use No    Social History   Socioeconomic History  . Marital status: Divorced    Spouse name: Not on file  . Number of children: 2  . Years of education: Not on file  . Highest education level: Not on file  Occupational History  . Occupation: Doctor, hospital: [employer]  . Financial resource strain: Not on file  . Food insecurity:    Worry: Not on file    Inability: Not on file  . Transportation needs:    Medical: Not on file    Non-medical: Not on file  Tobacco Use  . Smoking status: Former Smoker    Packs/day: 1.00    Years: 42.00    Pack years: 42.00    Types: Cigarettes    Last attempt to quit: 09/25/2016    Years since quitting: 0.8  . Smokeless tobacco: Former Systems developer    Quit date: 06/13/2016  Substance and Sexual Activity  . Alcohol use: No    Alcohol/week: 0.0 oz  . Drug use: No  . Sexual activity: Not Currently    Birth control/protection: Surgical  Lifestyle  . Physical activity:    Days per week: Not on file    Minutes per session: Not on file  . Stress: Not on file  Relationships  . Social connections:    Talks on phone: Not on file    Gets together: Not on file    Attends religious service: Not on file    Active member of club or organization: Not on file    Attends meetings of clubs or organizations: Not on file    Relationship status: Not on file  Other Topics Concern  . Not on file  Social History Narrative  . Not on file   Additional Social History:   Sleep: improving sleep, states she slept significantly better last night  Appetite:  Good  Current Medications: Current Facility-Administered Medications  Medication Dose Route Frequency Provider Last Rate Last Dose  . alum & mag hydroxide-simeth (MAALOX/MYLANTA) 200-200-20 MG/5ML suspension 30 mL  30 mL Oral Q4H PRN Starkes, Takia S, FNP      . busPIRone (BUSPAR) tablet 15 mg  15 mg Oral TID Ethelene Hal, NP   15 mg at 07/24/17 1211  . DULoxetine (CYMBALTA) DR capsule 90 mg  90 mg Oral Daily Ethelene Hal, NP   90 mg at 07/24/17 0758  . gabapentin (NEURONTIN) capsule 400 mg  400 mg Oral TID Money, Lowry Ram, FNP   400 mg at 07/24/17 1211  . magnesium hydroxide (MILK OF MAGNESIA) suspension 30 mL  30  mL Oral Daily PRN Nanci Pina, FNP   30 mL at 07/22/17 0839  . naproxen (NAPROSYN) tablet 500 mg  500 mg Oral BID WC Money, Lowry Ram, FNP   500 mg at 07/24/17 0758  . QUEtiapine (SEROQUEL) tablet 200 mg  200 mg Oral QHS Money, Lowry Ram, FNP   200 mg at 07/23/17 2106  . QUEtiapine (SEROQUEL) tablet 25 mg  25 mg Oral BID PRN Money, Lowry Ram, FNP   25 mg at 07/24/17 2229  . traZODone (DESYREL) tablet 100 mg  100 mg Oral QHS PRN,MR X 1 Rozetta Nunnery, NP        Lab  Results:  No results found for this or any previous visit (from the past 48 hour(s)).  Blood Alcohol level:  Lab Results  Component Value Date   ETH <10 07/17/2017   ETH <11 17/51/0258    Metabolic Disorder Labs: Lab Results  Component Value Date   HGBA1C 5.4 07/21/2017   MPG 108.28 07/21/2017   MPG 114 08/28/2012   No results found for: PROLACTIN Lab Results  Component Value Date   CHOL 200 07/21/2017   TRIG 208 (H) 07/21/2017   HDL 54 07/21/2017   CHOLHDL 3.7 07/21/2017   VLDL 42 (H) 07/21/2017   LDLCALC 104 (H) 07/21/2017   LDLCALC 119 (H) 10/08/2015    Physical Findings: AIMS: Facial and Oral Movements Muscles of Facial Expression: None, normal Lips and Perioral Area: None, normal Jaw: None, normal Tongue: None, normal,Extremity Movements Upper (arms, wrists, hands, fingers): None, normal Lower (legs, knees, ankles, toes): None, normal, Trunk Movements Neck, shoulders, hips: None, normal, Overall Severity Severity of abnormal movements (highest score from questions above): None, normal Incapacitation due to abnormal movements: None, normal Patient's awareness of abnormal movements (rate only patient's report): No Awareness, Dental Status Current problems with teeth and/or dentures?: No Does patient usually wear dentures?: No  CIWA:  CIWA-Ar Total: 6 COWS:  COWS Total Score: 4  Musculoskeletal: Strength & Muscle Tone: within normal limits Gait & Station: normal Patient leans: N/A  Psychiatric  Specialty Exam: Physical Exam  Nursing note and vitals reviewed. Constitutional: She is oriented to person, place, and time. She appears well-developed and well-nourished.  Cardiovascular: Normal rate.  Respiratory: Effort normal.  Musculoskeletal: Normal range of motion.  Neurological: She is alert and oriented to person, place, and time.  Skin: Skin is warm.    Review of Systems  Constitutional: Negative.   HENT: Negative.   Eyes: Negative.   Respiratory: Negative.   Cardiovascular: Negative.   Gastrointestinal: Negative.   Genitourinary: Negative.   Musculoskeletal: Positive for joint pain (improving).  Skin: Negative.   Neurological: Negative.   Endo/Heme/Allergies: Negative.   Psychiatric/Behavioral: Positive for depression. Negative for hallucinations and suicidal ideas. The patient is nervous/anxious.   no chest pain, no dyspnea, no vomiting  Blood pressure 122/73, pulse 67, temperature 98.2 F (36.8 C), temperature source Oral, resp. rate 16, height 5' 5" (1.651 m), weight 77.1 kg (170 lb), SpO2 99 %.Body mass index is 28.29 kg/m.  General Appearance: Fairly Groomed  Eye Contact:  Good  Speech:  Normal Rate  Volume:  Normal  Mood:  reports some improvement , but still depressed   Affect:  restricted, sad, vaguely irritable, improves partially during session  Thought Process:  Linear and Descriptions of Associations: Intact  Orientation:  Other:  fully alert and attentive  Thought Content:  no hallucinations, no deluisions, not internally preoccupied  Suicidal Thoughts:  No today denies suicidal plan or intention and contracts for safety on unit, denies HI  Homicidal Thoughts:  No  Memory:  recent and remote grossly intact  Judgement:  Other:  fair- improving  Insight:  Fair and improving   Psychomotor Activity:  Decreased- no psychomotor agitation or restlessness   Concentration:  Concentration: Good and Attention Span: Good  Recall:  Good  Fund of Knowledge:  Good   Language:  Good  Akathisia:  No  Handed:  Right  AIMS (if indicated):     Assets:  Communication Skills Desire for Improvement Financial Resources/Insurance Housing Physical Health Social Support Transportation  ADL's:  Intact  Cognition:  WNL  Sleep:  Number of Hours: 6.25   Assessment - 57 year old female , long history of depression, anxiety . Had been tapered off BZDs 2-3 weeks ago during an admission to another hospital, at patient's request, as she states she wants to be BZD free. She is not currently presenting with overt symptoms of BZD WDL and does not appear to be in any distress/discomfort and vitals are stable. Remains vaguely depressed, dysphoric, but denies SI. Slept better last night. Currently does not endorse medication side effects.   Treatment Plan Summary: Daily contact with patient to assess and evaluate symptoms and progress in treatment, Medication management and Plan is to:  -Treatment Plan reviewed as below today 4/22  -Encourage group therapy participation to work on coping skills and symptom reduction -Continue Buspar 15 mg PO TID for anxiety -Increase  Cymbalta to 60 mgrs BID for depression, anxiety -Continue Neurontin 400 mg PO TID for anxiety -Continue  Seroquel 200 mg PO QHS for mood disorder and insomnia  -Continue Seroquel 25 mg PO BID PRN for agitation and anxiety -Continue Trazodone 100 mg PO QHS PRN for insomnia -Continue Naproxen 500 mg PO BID WC PRN for pain -Treatment team working on disposition planning options Jenne Campus, MD 07/24/2017, 1:15 PM  Patient ID: Crystal Duarte, female   DOB: 28-May-1960, 57 y.o.   MRN: [id number]

## 2017-07-24 NOTE — Progress Notes (Signed)
Patient ID: Crystal Duarte, female   DOB: 18-Aug-1960, 57 y.o.   MRN: [id number]  Nursing Progress Note [id number]  Data: Patient presents pleasant and cooperative this morning. Patient reports she finally slept well last night. Patient complaint with scheduled medications. Patient requests PRN Seroquel this morning. Patient denies SI/HI/AVH or pain. Patient contracts for safety on the unit at this time. Patient completed self-inventory sheet and rates depression, hopelessness, and anxiety 5,3,5 respectively. Patient rates their sleep and appetite as good/fair respectively. Patient states goal for today is to "feel better", "go to groups" and "talk to my doctor". Patient is seen attending groups and visible in the milieu.  Action: Patient educated about and provided medication per provider's orders. Patient safety maintained with q15 min safety checks. Low fall risk precautions in place. Emotional support given. 1:1 interaction and active listening provided. Patient encouraged to attend meals and groups. Labs, vital signs and patient behavior monitored throughout shift. Patient encouraged to work on treatment plan and goals.  Response: Patient remains safe on the unit at this time. Patient is interacting with peers appropriately on the unit. Will continue to support and monitor.

## 2017-07-24 NOTE — Progress Notes (Signed)
Adult Psychoeducational Group Note  Date:  07/24/2017 Time:  6:21 PM  Group Topic/Focus:  Developing a Wellness Toolbox:   The focus of this group is to help patients develop a "wellness toolbox" with skills and strategies to promote recovery upon discharge.  Participation Level:  Active  Participation Quality:  Appropriate  Affect:  Appropriate  Cognitive:  Appropriate  Insight: Appropriate  Engagement in Group:  Engaged  Modes of Intervention:  Discussion  Additional Comments: Pt explained what wellness looks like in her life. She told about the reason why she is currently at [facility]. Pt was active throughout the entire group, and provided positive feedback to her peers. Crystal Duarte 07/24/2017, 6:21 PM

## 2017-07-24 NOTE — Plan of Care (Signed)
Problem: Activity: Goal: Sleep patterns will improve Intervention: Patient has been provided sleep medications and a restful sleep routine/atmosphere Outcome: Patient reports she slept "very good" last night. 07/24/2017 8:59 AM - Progressing by Annia Friendly, RN   Problem: Safety: Goal: Periods of time without injury will increase Intervention: Patient contracts for safety on the unit. Low fall risk precautions in place. Safety monitored with q15 minute checks. Outcome: Patient remains safe on the unit at this time. 07/24/2017 8:59 AM - Progressing by Annia Friendly, RN

## 2017-07-24 NOTE — Progress Notes (Signed)
D  Pt is pleasant on approach and is cooperative   She interacts well with others and her behavior is appropriate   She is compliant with treatment groups and medications A   Verbal support given    Medications administered and effectiveness monitored   Q 15 min checks R   Pt remains safe and receptive to verbal support

## 2017-07-25 NOTE — [facility] Group Notes (Signed)
[place] Group Notes:  (Nursing/MHT/Case Management/Adjunct)  Date:  07/25/2017  Time:  8:49 AM  Type of Therapy:  Nurse Education  Participation Level:  Did Not Attend  Participation Quality:    Affect:    Cognitive:    Insight:   Engagement in Group:    Modes of Intervention:    Summary of Progress/Problems: Patient sleeping, did not attend  Lesli Albee 07/25/2017, 8:49 AM

## 2017-07-25 NOTE — Progress Notes (Signed)
D: Patient observed in bed this morning however more visible this afternoon. Patient states after lunch she is quite anxious and requests either prn vistaril or seroquel. Patient's affect flat, anxious with congruent mood. Per self inventory and discussions with writer, rates depression at a 4/10, hopelessness at an 8/10 and anxiety at an 8/10. Rates sleep as good, appetite as good, energy as low and concentration as poor.  States goal for today is to "get rid of the feeling of uneasiness, go to groups, take my meds." Denies pain, physical complaints.   A: Medicated per orders, prn seroquel given for anxiety, agitation. Level III obs in place for safety. Emotional support offered and self inventory reviewed. Encouraged completion of Suicide Safety Plan and programming participation. Discussed POC with MD, SW.     R: Patient verbalizes understanding of POC. On reassess, patient reports some relief. Patient denies SI/HI/AVH and remains safe on level III obs. Will continue to monitor closely and make verbal contact frequently.

## 2017-07-25 NOTE — Progress Notes (Signed)
D  Pt is pleasant on approach and is cooperative   She interacts well with others and her behavior is appropriate   Her mood seems more depressed today then yesterday   Pt has been in the dayroom most of the evening putting a puzzle together    She is compliant with treatment groups and medications A   Verbal support given    Medications administered and effectiveness monitored   Q 15 min checks R   Pt remains safe and receptive to verbal support

## 2017-07-25 NOTE — Progress Notes (Signed)
Recreation Therapy Notes  Animal-Assisted Activity (AAA) Program Checklist/Progress Notes Patient Eligibility Criteria Checklist & Daily Group note for Rec Tx Intervention  Date: 4.23.19 Time: 70 Location: [address]   AAA/T Program Assumption of Risk Form signed by Patient/ or Parent Legal Guardian   Patient is free of allergies or sever asthma   Patient reports no fear of animals   Patient reports no history of cruelty to animals   Patient understands his/her participation is voluntary   Patient washes hands before animal contact   Patient washes hands after animal contact    Education: Hand Washing, Appropriate Animal Interaction   Education Outcome: Acknowledges understanding/In group clarification offered/Needs additional education.   Clinical Observations/Feedback: Pt did not attend activity.    Victorino Sparrow, LRT/CTRS         Victorino Sparrow A 07/25/2017 3:13 PM

## 2017-07-25 NOTE — Progress Notes (Signed)
[facility] MD Progress Note  07/25/2017 12:40 PM Crystal Duarte  MRN:  [id number] Subjective: Patient is seen and examined.  Patient is a 57 year old female with a past psychiatric history significant for major depression, generalized anxiety, and benzodiazepine dependence.  She was admitted on 07/19/17.  She stated she is feeling better today.  She still very anxious.  She is mildly tremulous.  She is worried about how her anxiety is going to be being completely off the benzodiazepines.  She had her Cymbalta increased yesterday.  She continues also on Seroquel 25 mg p.o. twice daily as well as at bedtime.  She denied any suicidal ideation.  She slept approximately 7 hours last night.  Labs were reviewed.  Blood pressure was 111/69, pulse was 94.  She rated her pain is a 0.  No other complaints. Principal Problem: MDD (major depressive disorder), recurrent severe, without psychosis ([place]) Diagnosis:   Patient Active Problem List   Diagnosis Date Noted  . MDD (major depressive disorder), recurrent severe, without psychosis ([place]) [F33.2] 07/18/2017  . DOE (dyspnea on exertion) [R06.09] 12/26/2016  . COPD GOLD II  [J44.9] 12/26/2016  . Orthostatic hypotension [I95.1] 12/14/2016  . Chest tightness [R07.89] 12/14/2016  . Weight loss [R63.4] 12/14/2016  . Routine general medical examination at a health care facility [Z00.00] 10/09/2015  . 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 Hx of adenomatous polyp of colon [Z86.010] 12/04/2014  . Memory loss [R41.3] 10/04/2014  . Affective bipolar disorder ([place]) [F31.9] 03/12/2014  . Peripheral vascular disease, unspecified ([place]) [I73.9] 10/10/2013  . Smoker [F17.200] 08/28/2012  . Menopause [Z78.0] 08/28/2012  . Multiple pulmonary nodules [R91.8] 11/12/2010   Total Time spent with patient: 20 minutes  Past Psychiatric History: See admission H&P  Past Medical History:  Past Medical History:  Diagnosis Date  . Coccidioidomycosis, pulmonary ([place])   .  Depression   . Hx of adenomatous polyp of colon 12/04/2014  . Osteopenia 11/2016   T score -2.2 FRAX 17%/2.4%  . Panic attack     Past Surgical History:  Procedure Laterality Date  . ABDOMINAL HYSTERECTOMY    . COLONOSCOPY  2001   hemorrhoidectomy  . LUNG SURGERY  2001   VATS surgery   . OVARIAN CYST REMOVAL  1970's  . VESICOVAGINAL FISTULA CLOSURE W/ TAH  2005   Family History:  Family History  Problem Relation Age of Onset  . Asthma Mother   . Ovarian cancer Mother   . Cancer Mother        OVARIAN  . Varicose Veins Mother   . Heart disease Father   . Peripheral vascular disease Father   . Cancer Maternal Grandmother        LUNG- LUNG  . Colon cancer Neg Hx    Family Psychiatric  History: See admission H&P Social History:  Social History   Substance and Sexual Activity  Alcohol Use No  . Alcohol/week: 0.0 oz     Social History   Substance and Sexual Activity  Drug Use No    Social History   Socioeconomic History  . Marital status: Divorced    Spouse name: Not on file  . Number of children: 2  . Years of education: Not on file  . Highest education level: Not on file  Occupational History  . Occupation: Forensic psychologist: [employer]  . Financial resource strain: Not on file  . Food insecurity:    Worry: Not on file  Inability: Not on file  . Transportation needs:    Medical: Not on file    Non-medical: Not on file  Tobacco Use  . Smoking status: Former Smoker    Packs/day: 1.00    Years: 42.00    Pack years: 42.00    Types: Cigarettes    Last attempt to quit: 09/25/2016    Years since quitting: 0.8  . Smokeless tobacco: Former Systems developer    Quit date: 06/13/2016  Substance and Sexual Activity  . Alcohol use: No    Alcohol/week: 0.0 oz  . Drug use: No  . Sexual activity: Not Currently    Birth control/protection: Surgical  Lifestyle  . Physical activity:    Days per week: Not on file    Minutes per session: Not on file   . Stress: Not on file  Relationships  . Social connections:    Talks on phone: Not on file    Gets together: Not on file    Attends religious service: Not on file    Active member of club or organization: Not on file    Attends meetings of clubs or organizations: Not on file    Relationship status: Not on file  Other Topics Concern  . Not on file  Social History Narrative  . Not on file   Additional Social History:                         Sleep: Good  Appetite:  Good  Current Medications: Current Facility-Administered Medications  Medication Dose Route Frequency Provider Last Rate Last Dose  . alum & mag hydroxide-simeth (MAALOX/MYLANTA) 200-200-20 MG/5ML suspension 30 mL  30 mL Oral Q4H PRN Starkes, Takia S, FNP      . busPIRone (BUSPAR) tablet 15 mg  15 mg Oral TID Ethelene Hal, NP   15 mg at 07/25/17 0817  . DULoxetine (CYMBALTA) DR capsule 60 mg  60 mg Oral BID Cobos, Myer Peer, MD   60 mg at 07/25/17 0817  . gabapentin (NEURONTIN) capsule 400 mg  400 mg Oral TID Money, Lowry Ram, FNP   400 mg at 07/25/17 0817  . magnesium hydroxide (MILK OF MAGNESIA) suspension 30 mL  30 mL Oral Daily PRN Nanci Pina, FNP   30 mL at 07/25/17 1052  . naproxen (NAPROSYN) tablet 500 mg  500 mg Oral BID PRN Cobos, Myer Peer, MD   500 mg at 07/24/17 1711  . QUEtiapine (SEROQUEL) tablet 200 mg  200 mg Oral QHS Money, Lowry Ram, FNP   200 mg at 07/24/17 2157  . QUEtiapine (SEROQUEL) tablet 25 mg  25 mg Oral BID PRN Money, Lowry Ram, FNP   25 mg at 07/24/17 1400  . traZODone (DESYREL) tablet 100 mg  100 mg Oral QHS PRN,MR X 1 Lindon Romp A, NP   100 mg at 07/24/17 2156    Lab Results: No results found for this or any previous visit (from the past 48 hour(s)).  Blood Alcohol level:  Lab Results  Component Value Date   ETH <10 07/17/2017   ETH <11 70/96/2836    Metabolic Disorder Labs: Lab Results  Component Value Date   HGBA1C 5.4 07/21/2017   MPG 108.28  07/21/2017   MPG 114 08/28/2012   No results found for: PROLACTIN Lab Results  Component Value Date   CHOL 200 07/21/2017   TRIG 208 (H) 07/21/2017   HDL 54 07/21/2017   CHOLHDL 3.7 07/21/2017  VLDL 42 (H) 07/21/2017   LDLCALC 104 (H) 07/21/2017   LDLCALC 119 (H) 10/08/2015    Physical Findings: AIMS: Facial and Oral Movements Muscles of Facial Expression: None, normal Lips and Perioral Area: None, normal Jaw: None, normal Tongue: None, normal,Extremity Movements Upper (arms, wrists, hands, fingers): None, normal Lower (legs, knees, ankles, toes): None, normal, Trunk Movements Neck, shoulders, hips: None, normal, Overall Severity Severity of abnormal movements (highest score from questions above): None, normal Incapacitation due to abnormal movements: None, normal Patient's awareness of abnormal movements (rate only patient's report): No Awareness, Dental Status Current problems with teeth and/or dentures?: No Does patient usually wear dentures?: No  CIWA:  CIWA-Ar Total: 6 COWS:  COWS Total Score: 4  Musculoskeletal: Strength & Muscle Tone: within normal limits Gait & Station: normal Patient leans: N/A  Psychiatric Specialty Exam: Physical Exam  Constitutional: She appears well-developed and well-nourished.  HENT:  Head: Normocephalic and atraumatic.  Respiratory: Effort normal.  Musculoskeletal: Normal range of motion.  Neurological: She is alert.    ROS  Blood pressure 111/69, pulse 94, temperature 98.3 F (36.8 C), temperature source Oral, resp. rate 16, height 5\' 5"  (1.651 m), weight 77.1 kg (170 lb), SpO2 99 %.Body mass index is 28.29 kg/m.  General Appearance: Casual  Eye Contact:  Fair  Speech:  Normal Rate  Volume:  Normal  Mood:  Anxious  Affect:  Congruent  Thought Process:  Coherent  Orientation:  Full (Time, Place, and Person)  Thought Content:  Logical  Suicidal Thoughts:  No  Homicidal Thoughts:  No  Memory:  Immediate;   Fair  Judgement:   Intact  Insight:  Fair  Psychomotor Activity:  Increased  Concentration:  Concentration: Fair  Recall:  [employer] of Knowledge:  Fair  Language:  Good  Akathisia:  No  Handed:  Right  AIMS (if indicated):     Assets:  Communication Skills Desire for Improvement Housing Resilience  ADL's:  Intact  Cognition:  WNL  Sleep:  Number of Hours: 6.75     Treatment Plan Summary: Daily contact with patient to assess and evaluate symptoms and progress in treatment, Medication management and Plan Patient is seen and examined.  Patient is a 57 year old female with the above-stated past psychiatric history who seen in follow-up.  She is slowly improving from what I can see of her old notes.  She is still significantly anxious.  The Seroquel has helped a bit.  We will continue the 25 mg p.o. twice daily as well as 200 mg nightly.  She also had the Cymbalta increased to 60 mg twice a day starting yesterday.  Her BuSpar is at 15 mg p.o. 3 times daily.  Her Neurontin is at 400 mg p.o. 3 times daily.  She also has Neurontin as necessary for sleep.  I am not going to change any of her medications today.  We will continue to monitor her physical symptoms of withdrawal.  We did discuss at length this morning the emotional dependency issues.  Mainly about how she had always reach for Klonopin in the past, and it would no longer be there and how which she cope with that.  Hopefully her tremulousness and withdrawal symptoms will improve during the course of the hospitalization.  Sharma Covert, MD 07/25/2017, 12:40 PM

## 2017-07-25 NOTE — Plan of Care (Signed)
Patient verbalizes understanding of information, education provided. 

## 2017-07-25 NOTE — [facility] Group Notes (Signed)
LCSW Group Therapy Note 07/25/2017 1:54 PM  Type of Therapy/Topic: Group Therapy: Feelings about Diagnosis  Participation Level: Active   Description of Group:  This group will allow patients to explore their thoughts and feelings about diagnoses they have received. Patients will be guided to explore their level of understanding and acceptance of these diagnoses. Facilitator will encourage patients to process their thoughts and feelings about the reactions of others to their diagnosis and will guide patients in identifying ways to discuss their diagnosis with significant others in their lives. This group will be process-oriented, with patients participating in exploration of their own experiences, giving and receiving support, and processing challenge from other group members.  Therapeutic Goals: 1. Patient will demonstrate understanding of diagnosis as evidenced by identifying two or more symptoms of the disorder 2. Patient will be able to express two feelings regarding the diagnosis 3. Patient will demonstrate their ability to communicate their needs through discussion and/or role play  Summary of Patient Progress:  Crystal Duarte was engaged and participated throughout the group session. Crystal Duarte reports that since coming to the hospital she has become aware of her symptoms. Crystal Duarte reports that due to experiencing withdrawals, she plans on never returning to the hospital or using drugs inappropriately.     Therapeutic Modalities:  Cognitive Behavioral Therapy Brief Therapy Feelings Identification    [place] Clinical Social Worker

## 2017-07-26 NOTE — [facility] Group Notes (Signed)
LCSW Group Therapy Note 07/26/2017 2:04 PM  Type of Therapy/Topic: Group Therapy: Emotion Regulation  Participation Level: Active   Description of Group:  The purpose of this group is to assist patients in learning to regulate negative emotions and experience positive emotions. Patients will be guided to discuss ways in which they have been vulnerable to their negative emotions. These vulnerabilities will be juxtaposed with experiences of positive emotions or situations, and patients will be challenged to use positive emotions to combat negative ones. Special emphasis will be placed on coping with negative emotions in conflict situations, and patients will process healthy conflict resolution skills.  Therapeutic Goals: 1. Patient will identify two positive emotions or experiences to reflect on in order to balance out negative emotions 2. Patient will label two or more emotions that they find the most difficult to experience 3. Patient will demonstrate positive conflict resolution skills through discussion and/or role plays  Summary of Patient Progress:  Shenelle was engaged and participated throughout the group session. [place]annah states that the main emotion she struggles regulating is "anger". Robinn states that she has identified that she becomes light headed and dizzy when she is becoming mad. Belma states that she hopes to learn better coping skills to help regulate her emotions going forward.    Therapeutic Modalities:  Cognitive Behavioral Therapy Feelings Identification Dialectical Behavioral Therapy   Theresa Duty Clinical Social Worker

## 2017-07-26 NOTE — Progress Notes (Signed)
Recreation Therapy Notes  Date: 4.24.19 Time: 0930 Location: [address]  Group Topic: Stress Management  Goal Area(s) Addresses:  Patient will verbalize importance of using healthy stress management.  Patient will identify positive emotions associated with healthy stress management.   Intervention: Stress Management  Activity :  Guided Imagery.  LRT introduced the stress management technique of guided imagery.  LRT read a script leading patients on a journey to the beach.  Patients were to listen and follow along as script was read.  Education:  Stress Management, Discharge Planning.   Education Outcome: Acknowledges edcuation/In group clarification offered/Needs additional education  Clinical Observations/Feedback: Pt did not attend group.    Victorino Sparrow, LRT/CTRS         Victorino Sparrow A 07/26/2017 12:22 PM

## 2017-07-26 NOTE — Progress Notes (Signed)
[facility] MD Progress Note  07/26/2017 10:37 AM Crystal Duarte  MRN:  [id number]   Subjective: Patient reports that she is doing better today.  She denies any tremors, but she does report kind of like a quick shaking sensation that comes and goes very quickly, but denies it affecting her mental status.  She denies any dizziness, nausea, headaches.  Patient reports that she still has some anxiety, but the medications seem to be working quite well for her.  She was to continue the medications where they are at the moment and will discuss with tapering up or down of her medications with her outpatient provider.  She is hoping to discharge soon.  She feels that possibly tomorrow she will be ready to go.  She denies any SI/HI/AVH and contracts for safety.  Objective: Patient's chart and findings reviewed and discussed with treatment team.  Patient presents in the milieu and is pleasant and cooperative.  She appears much brighter and appropriate affect.  She has been seen in the day room and attending groups and interacting appropriately with peers and staff.  She appears more well groomed and engaging in conversation.  We will continue current medications.  We will discuss discharge for tomorrow if patient remains stable overnight.  Principal Problem: MDD (major depressive disorder), recurrent severe, without psychosis ([place]) Diagnosis:   Patient Active Problem List   Diagnosis Date Noted  . MDD (major depressive disorder), recurrent severe, without psychosis ([place]) [F33.2] 07/18/2017  . DOE (dyspnea on exertion) [R06.09] 12/26/2016  . COPD GOLD II  [J44.9] 12/26/2016  . Orthostatic hypotension [I95.1] 12/14/2016  . Chest tightness [R07.89] 12/14/2016  . Weight loss [R63.4] 12/14/2016  . Routine general medical examination at a health care facility [Z00.00] 10/09/2015  . 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 Hx of adenomatous polyp of colon [Z86.010] 12/04/2014  . Memory loss [R41.3] 10/04/2014  .  Affective bipolar disorder ([place]) [F31.9] 03/12/2014  . Peripheral vascular disease, unspecified ([place]) [I73.9] 10/10/2013  . Smoker [F17.200] 08/28/2012  . Menopause [Z78.0] 08/28/2012  . Multiple pulmonary nodules [R91.8] 11/12/2010   Total Time spent with patient: 15 minutes  Past Psychiatric History: See H&P  Past Medical History:  Past Medical History:  Diagnosis Date  . Coccidioidomycosis, pulmonary ([place])   . Depression   . Hx of adenomatous polyp of colon 12/04/2014  . Osteopenia 11/2016   T score -2.2 FRAX 17%/2.4%  . Panic attack     Past Surgical History:  Procedure Laterality Date  . ABDOMINAL HYSTERECTOMY    . COLONOSCOPY  2001   hemorrhoidectomy  . LUNG SURGERY  2001   VATS surgery   . OVARIAN CYST REMOVAL  1970's  . VESICOVAGINAL FISTULA CLOSURE W/ TAH  2005   Family History:  Family History  Problem Relation Age of Onset  . Asthma Mother   . Ovarian cancer Mother   . Cancer Mother        OVARIAN  . Varicose Veins Mother   . Heart disease Father   . Peripheral vascular disease Father   . Cancer Maternal Grandmother        LUNG- LUNG  . Colon cancer Neg Hx    Family Psychiatric  History: See H&P Social History:  Social History   Substance and Sexual Activity  Alcohol Use No  . Alcohol/week: 0.0 oz     Social History   Substance and Sexual Activity  Drug Use No    Social History   Socioeconomic History  .  Marital status: Divorced    Spouse name: Not on file  . Number of children: 2  . Years of education: Not on file  . Highest education level: Not on file  Occupational History  . Occupation: Forensic psychologist: [employer]  . Financial resource strain: Not on file  . Food insecurity:    Worry: Not on file    Inability: Not on file  . Transportation needs:    Medical: Not on file    Non-medical: Not on file  Tobacco Use  . Smoking status: Former Smoker    Packs/day: 1.00    Years: 42.00    Pack years:  42.00    Types: Cigarettes    Last attempt to quit: 09/25/2016    Years since quitting: 0.8  . Smokeless tobacco: Former Systems developer    Quit date: 06/13/2016  Substance and Sexual Activity  . Alcohol use: No    Alcohol/week: 0.0 oz  . Drug use: No  . Sexual activity: Not Currently    Birth control/protection: Surgical  Lifestyle  . Physical activity:    Days per week: Not on file    Minutes per session: Not on file  . Stress: Not on file  Relationships  . Social connections:    Talks on phone: Not on file    Gets together: Not on file    Attends religious service: Not on file    Active member of club or organization: Not on file    Attends meetings of clubs or organizations: Not on file    Relationship status: Not on file  Other Topics Concern  . Not on file  Social History Narrative  . Not on file   Additional Social History:                         Sleep: Fair  Appetite:  Good  Current Medications: Current Facility-Administered Medications  Medication Dose Route Frequency Provider Last Rate Last Dose  . alum & mag hydroxide-simeth (MAALOX/MYLANTA) 200-200-20 MG/5ML suspension 30 mL  30 mL Oral Q4H PRN Starkes, Takia S, FNP      . busPIRone (BUSPAR) tablet 15 mg  15 mg Oral TID Ethelene Hal, NP   15 mg at 07/26/17 0747  . DULoxetine (CYMBALTA) DR capsule 60 mg  60 mg Oral BID Cobos, Myer Peer, MD   60 mg at 07/26/17 0747  . gabapentin (NEURONTIN) capsule 400 mg  400 mg Oral TID Mikail Goostree, Lowry Ram, FNP   400 mg at 07/26/17 0747  . magnesium hydroxide (MILK OF MAGNESIA) suspension 30 mL  30 mL Oral Daily PRN Nanci Pina, FNP   30 mL at 07/25/17 1052  . naproxen (NAPROSYN) tablet 500 mg  500 mg Oral BID PRN Cobos, Myer Peer, MD   500 mg at 07/26/17 0749  . QUEtiapine (SEROQUEL) tablet 200 mg  200 mg Oral QHS Evert Wenrich, Lowry Ram, FNP   200 mg at 07/25/17 2113  . QUEtiapine (SEROQUEL) tablet 25 mg  25 mg Oral BID PRN Magdalen Cabana, Lowry Ram, FNP   25 mg at 07/25/17 1255   . traZODone (DESYREL) tablet 100 mg  100 mg Oral QHS PRN,MR X 1 Lindon Romp A, NP   100 mg at 07/25/17 2113    Lab Results: No results found for this or any previous visit (from the past 48 hour(s)).  Blood Alcohol level:  Lab Results  Component Value Date  ETH <10 07/17/2017   ETH <11 09/32/3557    Metabolic Disorder Labs: Lab Results  Component Value Date   HGBA1C 5.4 07/21/2017   MPG 108.28 07/21/2017   MPG 114 08/28/2012   No results found for: PROLACTIN Lab Results  Component Value Date   CHOL 200 07/21/2017   TRIG 208 (H) 07/21/2017   HDL 54 07/21/2017   CHOLHDL 3.7 07/21/2017   VLDL 42 (H) 07/21/2017   LDLCALC 104 (H) 07/21/2017   LDLCALC 119 (H) 10/08/2015    Physical Findings: AIMS: Facial and Oral Movements Muscles of Facial Expression: None, normal Lips and Perioral Area: None, normal Jaw: None, normal Tongue: None, normal,Extremity Movements Upper (arms, wrists, hands, fingers): None, normal Lower (legs, knees, ankles, toes): None, normal, Trunk Movements Neck, shoulders, hips: None, normal, Overall Severity Severity of abnormal movements (highest score from questions above): None, normal Incapacitation due to abnormal movements: None, normal Patient's awareness of abnormal movements (rate only patient's report): No Awareness, Dental Status Current problems with teeth and/or dentures?: No Does patient usually wear dentures?: No  CIWA:  CIWA-Ar Total: 6 COWS:  COWS Total Score: 4  Musculoskeletal: Strength & Muscle Tone: within normal limits Gait & Station: normal Patient leans: N/A  Psychiatric Specialty Exam: Physical Exam  Nursing note and vitals reviewed. Constitutional: She is oriented to person, place, and time. She appears well-developed and well-nourished.  Cardiovascular: Normal rate.  Respiratory: Effort normal.  Musculoskeletal: Normal range of motion.  Neurological: She is alert and oriented to person, place, and time.  Skin:  Skin is warm.    Review of Systems  Constitutional: Negative.   HENT: Negative.   Eyes: Negative.   Respiratory: Negative.   Cardiovascular: Negative.   Gastrointestinal: Negative.   Genitourinary: Negative.   Musculoskeletal: Negative.   Skin: Negative.   Neurological: Negative.   Endo/Heme/Allergies: Negative.   Psychiatric/Behavioral: Negative for hallucinations and suicidal ideas. The patient is nervous/anxious.     Blood pressure 110/73, pulse 95, temperature (!) 97.5 F (36.4 C), temperature source Oral, resp. rate 16, height 5\' 5"  (1.651 m), weight 77.1 kg (170 lb), SpO2 99 %.Body mass index is 28.29 kg/m.  General Appearance: Casual and Fairly Groomed  Eye Contact:  Good  Speech:  Clear and Coherent and Normal Rate  Volume:  Normal  Mood:  Anxious and Euthymic  Affect:  Appropriate  Thought Process:  Goal Directed and Descriptions of Associations: Intact  Orientation:  Full (Time, Place, and Person)  Thought Content:  WDL  Suicidal Thoughts:  No  Homicidal Thoughts:  No  Memory:  Immediate;   Good Recent;   Good Remote;   Good  Judgement:  Good  Insight:  Good  Psychomotor Activity:  Normal  Concentration:  Concentration: Good and Attention Span: Good  Recall:  Good  Fund of Knowledge:  Good  Language:  Good  Akathisia:  No  Handed:  Right  AIMS (if indicated):     Assets:  Communication Skills Desire for Improvement Financial Resources/Insurance Housing Physical Health Social Support Transportation  ADL's:  Intact  Cognition:  WNL  Sleep:  Number of Hours: 5.75   Problems Addressed: MDD severe recurrent  Treatment Plan Summary: Daily contact with patient to assess and evaluate symptoms and progress in treatment, Medication management and Plan is to:  -Continue BuSpar 15 mg p.o. 3 times daily for anxiety and mood stability -Continue Cymbalta 60 mg p.o. twice daily for mood stability -Continue Seroquel 200 mg p.o. nightly for mood  stability -  Continue Seroquel 25 mg p.o. twice daily as needed for agitation anxiety -Continue trazodone 100 mg p.o. nightly as needed for insomnia -Continue Neurontin 400 mg p.o. 3 times daily for neuropathic pain and agitation -Encourage group therapy participation -Possible discharge tomorrow if remains stable  Lewis Shock, FNP 07/26/2017, 10:37 AM

## 2017-07-26 NOTE — Progress Notes (Signed)
Patient ID: Crystal Duarte, female   DOB: [birth year], 57 y.o.   MRN: [id number]  D: Patient with bright affect, denies SI/HI/AVH, reports that: "I am feeling much better".  Pt reports that her sleep quality last night was poor, reports a good appetite, reports that her energy level is normal, and reports that her concentration level today is normal.  Pt rates her depression level today as "3" (10 being the worst), and rates her hopelessness level as "3" (10 being the worst).  Patient stated that she did not know what her goal for the day was.  Pt was reclusive to her room in the morning, but has been more visible in the milieu this afternoon, interacting in activities with her peers and staff.  A: Pt was educated on all of her meds, and took all of her meds as scheduled.  Pt is being maintained on Q15 minute safety checks.  R: Pt currently denies any concerns, will continue to monitor.

## 2017-07-27 MED ORDER — QUETIAPINE FUMARATE 300 MG PO TABS
300.0000 mg | ORAL_TABLET | Freq: Every day | ORAL | Status: DC
  Administered 2017-07-27: 300 mg via ORAL
  Filled 2017-07-27 (×3): qty 1

## 2017-07-27 NOTE — Progress Notes (Signed)
Pt attend wrap up group. Her day was a 7. Her goal was get her medication help her feel better during the day.she needs to rest and stop eatting.

## 2017-07-27 NOTE — Progress Notes (Signed)
D:  Patient's self inventory sheet, patient sleeps good, sleep medication helpful.  Good appetite, low energy level, good concentration.  Rated depression and hopeless 2, anxiety 7.  Withdrawals, agitation, irritability, cravings, tremors.  Denied SI.  Physical problems, lightheaded, dizziness.  Denied physical pain.  Goal is attend group and get ready for discharge.  Plans to attend groups and take medications.  No discharge plans.  A:  Medications administered per MD orders.  Emotional support and encouragement given patient. R: Denied SI and HI, contracts for safety.  Denied AV hallucinations.  Safety maintained with 15 minute checks.

## 2017-07-27 NOTE — Progress Notes (Signed)
Patient ID: Crystal Duarte, female   DOB: 1961/03/31, 57 y.o.   MRN: [id number]  Pt currently presents with a worried affect and anxious behavior. Pt reports to writer that their goal is to "make a plan for when I am discharged so I won't end up in curled in a ball again." Reports increased anxiety and dizziness. Pt states "I just want to feel completely better."  Reports increased energy, decreased depressive thoughts. Pt reports intermittent sleep with current medication regimen.   Pt provided with medications per providers orders. Pt's labs and vitals were monitored throughout the night. Pt given a 1:1 about emotional and mental status. Pt supported and encouraged to express concerns and questions. Pt educated on medications, physiology of depression and CBT as it can be used for anxiety treatment.   Pt's safety ensured with 15 minute and environmental checks. Pt currently denies SI/HI and A/V hallucinations. Pt verbally agrees to seek staff if SI/HI or A/VH occurs and to consult with staff before acting on any harmful thoughts. Will continue POC.

## 2017-07-27 NOTE — Progress Notes (Signed)
D    Pt is brighter and smiling more   She said she probably would be discharged tomorrow and feels ready to go   She reports difficulty getting a good nights sleep and is hopeful she will tonight A    Verbal support given   Medication administered and effectiveness monitored    Q 15 min checks R   Pt is safe at present time

## 2017-07-27 NOTE — Plan of Care (Signed)
Nurse discussed anxiety, depression, coping skills with patient. 

## 2017-07-27 NOTE — Progress Notes (Signed)
[facility] MD Progress Note  07/27/2017 10:29 AM Crystal Duarte  MRN:  [id number]   Subjective: Patient states that she slept about 6 hours last night which she enjoyed.  She continues to say that her improved sleep last night has made her feel better today and that is why she felt that she had such a bad day yesterday.  She reported that she had poor sleep the night before.  She reports that she took the Seroquel 25 mg twice a day and that did not help as much as she had hoped.  She is still concerned about her anxiety and it worsening after her discharge.  She continues to deny SI/HI/AVH and contracts for safety.  She also states that she already has psychiatry and therapy established.  Objective: Patient's chart and findings reviewed and discussed with treatment team.  Patient presents walking down the hallway and is pleasant and cooperative.  Discussed patient's medications again and that she is maxed out her BuSpar dose and the Cymbalta dosing will increase the Seroquel to 300 mg nightly and she states agreement with this.  She does seem to be less anxious today after having 6 hours of sleep but would like to see that improved to 7 or 8 hours of sleep at night. Potential discharge tomorrow. Peer support is speaking with her today.  Principal Problem: MDD (major depressive disorder), recurrent severe, without psychosis ([place]) Diagnosis:   Patient Active Problem List   Diagnosis Date Noted  . MDD (major depressive disorder), recurrent severe, without psychosis ([place]) [F33.2] 07/18/2017  . DOE (dyspnea on exertion) [R06.09] 12/26/2016  . COPD GOLD II  [J44.9] 12/26/2016  . Orthostatic hypotension [I95.1] 12/14/2016  . Chest tightness [R07.89] 12/14/2016  . Weight loss [R63.4] 12/14/2016  . Routine general medical examination at a health care facility [Z00.00] 10/09/2015  . 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 Hx of adenomatous polyp of colon [Z86.010] 12/04/2014  . Memory loss [R41.3]  10/04/2014  . Affective bipolar disorder ([place]) [F31.9] 03/12/2014  . Peripheral vascular disease, unspecified ([place]) [I73.9] 10/10/2013  . Smoker [F17.200] 08/28/2012  . Menopause [Z78.0] 08/28/2012  . Multiple pulmonary nodules [R91.8] 11/12/2010   Total Time spent with patient: 30 minutes  Past Psychiatric History: See H&P  Past Medical History:  Past Medical History:  Diagnosis Date  . Coccidioidomycosis, pulmonary ([place])   . Depression   . Hx of adenomatous polyp of colon 12/04/2014  . Osteopenia 11/2016   T score -2.2 FRAX 17%/2.4%  . Panic attack     Past Surgical History:  Procedure Laterality Date  . ABDOMINAL HYSTERECTOMY    . COLONOSCOPY  2001   hemorrhoidectomy  . LUNG SURGERY  2001   VATS surgery   . OVARIAN CYST REMOVAL  1970's  . VESICOVAGINAL FISTULA CLOSURE W/ TAH  2005   Family History:  Family History  Problem Relation Age of Onset  . Asthma Mother   . Ovarian cancer Mother   . Cancer Mother        OVARIAN  . Varicose Veins Mother   . Heart disease Father   . Peripheral vascular disease Father   . Cancer Maternal Grandmother        LUNG- LUNG  . Colon cancer Neg Hx    Family Psychiatric  History: See H&P Social History:  Social History   Substance and Sexual Activity  Alcohol Use No  . Alcohol/week: 0.0 oz     Social History   Substance and Sexual Activity  Drug Use No    Social History   Socioeconomic History  . Marital status: Divorced    Spouse name: Not on file  . Number of children: 2  . Years of education: Not on file  . Highest education level: Not on file  Occupational History  . Occupation: Forensic psychologist: [employer]  . Financial resource strain: Not on file  . Food insecurity:    Worry: Not on file    Inability: Not on file  . Transportation needs:    Medical: Not on file    Non-medical: Not on file  Tobacco Use  . Smoking status: Former Smoker    Packs/day: 1.00    Years: 42.00     Pack years: 42.00    Types: Cigarettes    Last attempt to quit: 09/25/2016    Years since quitting: 0.8  . Smokeless tobacco: Former Systems developer    Quit date: 06/13/2016  Substance and Sexual Activity  . Alcohol use: No    Alcohol/week: 0.0 oz  . Drug use: No  . Sexual activity: Not Currently    Birth control/protection: Surgical  Lifestyle  . Physical activity:    Days per week: Not on file    Minutes per session: Not on file  . Stress: Not on file  Relationships  . Social connections:    Talks on phone: Not on file    Gets together: Not on file    Attends religious service: Not on file    Active member of club or organization: Not on file    Attends meetings of clubs or organizations: Not on file    Relationship status: Not on file  Other Topics Concern  . Not on file  Social History Narrative  . Not on file   Additional Social History:                         Sleep: Good  Appetite:  Good  Current Medications: Current Facility-Administered Medications  Medication Dose Route Frequency Provider Last Rate Last Dose  . alum & mag hydroxide-simeth (MAALOX/MYLANTA) 200-200-20 MG/5ML suspension 30 mL  30 mL Oral Q4H PRN Starkes, Takia S, FNP      . busPIRone (BUSPAR) tablet 15 mg  15 mg Oral TID Ethelene Hal, NP   15 mg at 07/27/17 0819  . DULoxetine (CYMBALTA) DR capsule 60 mg  60 mg Oral BID Cobos, Myer Peer, MD   60 mg at 07/27/17 0820  . gabapentin (NEURONTIN) capsule 400 mg  400 mg Oral TID Janele Lague, Lowry Ram, FNP   400 mg at 07/27/17 0820  . magnesium hydroxide (MILK OF MAGNESIA) suspension 30 mL  30 mL Oral Daily PRN Nanci Pina, FNP   30 mL at 07/25/17 1052  . naproxen (NAPROSYN) tablet 500 mg  500 mg Oral BID PRN Cobos, Myer Peer, MD   500 mg at 07/27/17 4854  . QUEtiapine (SEROQUEL) tablet 25 mg  25 mg Oral BID PRN Clanton Emanuelson, Lowry Ram, FNP   25 mg at 07/26/17 1314  . QUEtiapine (SEROQUEL) tablet 300 mg  300 mg Oral QHS Aissa Lisowski B, FNP      .  traZODone (DESYREL) tablet 100 mg  100 mg Oral QHS PRN,MR X 1 Lindon Romp A, NP   100 mg at 07/26/17 2118    Lab Results: No results found for this or any previous visit (from the past 48 hour(s)).  Blood Alcohol level:  Lab Results  Component Value Date   ETH <10 07/17/2017   ETH <11 95/62/1308    Metabolic Disorder Labs: Lab Results  Component Value Date   HGBA1C 5.4 07/21/2017   MPG 108.28 07/21/2017   MPG 114 08/28/2012   No results found for: PROLACTIN Lab Results  Component Value Date   CHOL 200 07/21/2017   TRIG 208 (H) 07/21/2017   HDL 54 07/21/2017   CHOLHDL 3.7 07/21/2017   VLDL 42 (H) 07/21/2017   LDLCALC 104 (H) 07/21/2017   LDLCALC 119 (H) 10/08/2015    Physical Findings: AIMS: Facial and Oral Movements Muscles of Facial Expression: None, normal Lips and Perioral Area: None, normal Jaw: None, normal Tongue: None, normal,Extremity Movements Upper (arms, wrists, hands, fingers): None, normal Lower (legs, knees, ankles, toes): None, normal, Trunk Movements Neck, shoulders, hips: None, normal, Overall Severity Severity of abnormal movements (highest score from questions above): None, normal Incapacitation due to abnormal movements: None, normal Patient's awareness of abnormal movements (rate only patient's report): No Awareness, Dental Status Current problems with teeth and/or dentures?: No Does patient usually wear dentures?: No  CIWA:  CIWA-Ar Total: 6 COWS:  COWS Total Score: 4  Musculoskeletal: Strength & Muscle Tone: within normal limits Gait & Station: normal Patient leans: N/A  Psychiatric Specialty Exam: Physical Exam  Nursing note and vitals reviewed. Constitutional: She is oriented to person, place, and time. She appears well-developed and well-nourished.  Cardiovascular: Normal rate.  Respiratory: Effort normal.  Musculoskeletal: Normal range of motion.  Neurological: She is alert and oriented to person, place, and time.  Skin: Skin is  warm.    Review of Systems  Constitutional: Negative.   HENT: Negative.   Eyes: Negative.   Respiratory: Negative.   Cardiovascular: Negative.   Gastrointestinal: Negative.   Genitourinary: Negative.   Musculoskeletal: Negative.   Skin: Negative.   Neurological: Negative.   Endo/Heme/Allergies: Negative.   Psychiatric/Behavioral: Negative for hallucinations and suicidal ideas. The patient is nervous/anxious.     Blood pressure 104/76, pulse 92, temperature 98.4 F (36.9 C), temperature source Oral, resp. rate 16, height 5\' 5"  (1.651 m), weight 77.1 kg (170 lb), SpO2 99 %.Body mass index is 28.29 kg/m.  General Appearance: Casual and Fairly Groomed  Eye Contact:  Good  Speech:  Clear and Coherent and Normal Rate  Volume:  Normal  Mood:  Anxious and Euthymic anxiety is still improving  Affect:  Appropriate  Thought Process:  Goal Directed and Descriptions of Associations: Intact  Orientation:  Full (Time, Place, and Person)  Thought Content:  WDL  Suicidal Thoughts:  No  Homicidal Thoughts:  No  Memory:  Immediate;   Good Recent;   Good Remote;   Good  Judgement:  Good  Insight:  Good  Psychomotor Activity:  Normal  Concentration:  Concentration: Good and Attention Span: Good  Recall:  Good  Fund of Knowledge:  Good  Language:  Good  Akathisia:  No  Handed:  Right  AIMS (if indicated):     Assets:  Communication Skills Desire for Improvement Financial Resources/Insurance Housing Physical Health Social Support Transportation  ADL's:  Intact  Cognition:  WNL  Sleep:  Number of Hours: 6.75   Problems Addressed: MDD severe recurrent  Treatment Plan Summary: Daily contact with patient to assess and evaluate symptoms and progress in treatment, Medication management and Plan is to:  -Continue BuSpar 15 mg p.o. 3 times daily for anxiety and mood stability -Continue Cymbalta 60 mg p.o.  twice daily for mood stability -Increase Seroquel 300 mg p.o. nightly for mood  stability -Continue Seroquel 25 mg p.o. twice daily as needed for agitation anxiety -Continue Trazodone 100 mg p.o. nightly as needed for insomnia -Continue Neurontin 400 mg p.o. 3 times daily for neuropathic pain and agitation -Encourage group therapy participation -Possible discharge tomorrow if remains stable  Lewis Shock, FNP 07/27/2017, 10:29 AM

## 2017-07-28 MED ORDER — TRAZODONE HCL 100 MG PO TABS: 100.0000 mg | ORAL_TABLET | Freq: Every evening | ORAL | 0 refills | Status: DC | PRN

## 2017-07-28 MED ORDER — DULOXETINE HCL 60 MG PO CPEP: 60.0000 mg | ORAL_CAPSULE | Freq: Two times a day (BID) | ORAL | 0 refills | Status: DC

## 2017-07-28 MED ORDER — GABAPENTIN 400 MG PO CAPS: 400.0000 mg | ORAL_CAPSULE | Freq: Three times a day (TID) | ORAL | 0 refills | Status: DC

## 2017-07-28 MED ORDER — QUETIAPINE FUMARATE 300 MG PO TABS: 300.0000 mg | ORAL_TABLET | Freq: Every day | ORAL | 0 refills | Status: DC

## 2017-07-28 NOTE — Progress Notes (Signed)
  [facility] Adult Case Management Discharge Plan :  Will you be returning to the same living situation after discharge:  Yes,  the patient reports she is returning home with her son At discharge, do you have transportation home?: Yes,  patient reports her son is picking her up at discharge Do you have the ability to pay for your medications: Yes,  [employer]  Release of information consent forms completed and in the chart;  Patient's signature needed at discharge.  Patient to Follow up at: Follow-up [facility] Follow up on 08/07/2017.   Why:  Medication management appt on Monday, 08/07/17 at 5:00PM. Thank you.  Contact information: ATTN: Dr. Leonides Sake [address] [place], [place] [address] Phone: [phone] Fax: [phone]        Dr. Doroteo Glassman PhD-[facility] Follow up on 08/08/2017.   Why:  Therapy appt scheduled for Wed, 08/08/17 at 3:00PM with Dr. Redmond Pulling. Thank you.  Contact information: [address] [place], [place] [address] Phone: [phone] ext. [phone] Fax: [place]. Go on 08/01/2017.   Specialty:  Behavioral Health Why:  Appointment is Tuesday, 08/01/17 at [place] information: [place] [id number] [place] [phone]          Next level of care provider has access to [place] and Suicide Prevention discussed: Yes,  with the patient's son  Have you used any form of tobacco in the last 30 days? (Cigarettes, Smokeless Tobacco, Cigars, and/or Pipes): No  Has patient been referred to the Quitline?: N/A patient is not a smoker  Patient has been referred for addiction treatment: Pt. refused referral  Marylee Floras, [place] 07/28/2017, 9:20 AM

## 2017-07-28 NOTE — BHH Suicide Risk Assessment (Signed)
[facility] Discharge Suicide Risk Assessment   Principal Problem: MDD (major depressive disorder), recurrent severe, without psychosis ([place]) Discharge Diagnoses:  Patient Active Problem List   Diagnosis Date Noted  . MDD (major depressive disorder), recurrent severe, without psychosis ([place]) [F33.2] 07/18/2017  . DOE (dyspnea on exertion) [R06.09] 12/26/2016  . COPD GOLD II  [J44.9] 12/26/2016  . Orthostatic hypotension [I95.1] 12/14/2016  . Chest tightness [R07.89] 12/14/2016  . Weight loss [R63.4] 12/14/2016  . Routine general medical examination at a health care facility [Z00.00] 10/09/2015  . 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 Hx of adenomatous polyp of colon [Z86.010] 12/04/2014  . Memory loss [R41.3] 10/04/2014  . Affective bipolar disorder ([place]) [F31.9] 03/12/2014  . Peripheral vascular disease, unspecified ([place]) [I73.9] 10/10/2013  . Smoker [F17.200] 08/28/2012  . Menopause [Z78.0] 08/28/2012  . Multiple pulmonary nodules [R91.8] 11/12/2010    Total Time spent with patient: 30 minutes  Musculoskeletal: Strength & Muscle Tone: within normal limits Gait & Station: normal Patient leans: N/A  Psychiatric Specialty Exam: Review of Systems  All other systems reviewed and are negative.   Blood pressure 109/65, pulse (!) 116, temperature 98.2 F (36.8 C), temperature source Oral, resp. rate 16, height 5\' 5"  (1.651 m), weight 77.1 kg (170 lb), SpO2 99 %.Body mass index is 28.29 kg/m.  General Appearance: Casual  Eye Contact::  Good  Speech:  Clear and Coherent409  Volume:  Normal  Mood:  Anxious  Affect:  Congruent  Thought Process:  Coherent  Orientation:  Full (Time, Place, and Person)  Thought Content:  Logical  Suicidal Thoughts:  No  Homicidal Thoughts:  No  Memory:  Immediate;   Fair  Judgement:  Fair  Insight:  Fair  Psychomotor Activity:  Increased  Concentration:  Good  Recall:  [place] of Knowledge:Good  Language: Good  Akathisia:  No  Handed:   Right  AIMS (if indicated):     Assets:  Communication Skills Desire for Improvement Housing Physical Health Social Support  Sleep:  Number of Hours: 6.75  Cognition: WNL  ADL's:  Intact   Mental Status Per Nursing Assessment::   On Admission:     Demographic Factors:  Caucasian  Loss Factors: NA  Historical Factors: Impulsivity  Risk Reduction Factors:   Positive social support  Continued Clinical Symptoms:  Previous Psychiatric Diagnoses and Treatments  Cognitive Features That Contribute To Risk:  None    Suicide Risk:  Minimal: No identifiable suicidal ideation.  Patients presenting with no risk factors but with morbid ruminations; may be classified as minimal risk based on the severity of the depressive symptoms  Follow-up [facility] Follow up on 08/07/2017.   Why:  Medication management appt on Monday, 08/07/17 at 5:00PM. Thank you.  Contact information: ATTN: Dr. Leonides Sake [address] [place], [place] [address] Phone: [phone] Fax: [phone]        Dr. Doroteo Glassman PhD-[facility] Follow up on 08/08/2017.   Why:  Therapy appt scheduled for Wed, 08/08/17 at 3:00PM with Dr. Redmond Pulling. Thank you.  Contact information: [address] [place],  [address] Phone: [phone] ext. [phone] Fax: [phone]          Plan Of Care/Follow-up recommendations:  Activity:  ad lib  Sharma Covert, MD 07/28/2017, 9:12 AM

## 2017-07-28 NOTE — Plan of Care (Signed)
  Problem: Safety: Goal: Periods of time without injury will increase Outcome: Completed/Met   Problem: Education: Goal: Emotional status will improve Outcome: Completed/Met Goal: Mental status will improve Outcome: Completed/Met   Problem: Activity: Goal: Interest or engagement in activities will improve Outcome: Completed/Met Goal: Sleeping patterns will improve Outcome: Completed/Met   Problem: Coping: Goal: Ability to verbalize frustrations and anger appropriately will improve Outcome: Completed/Met Goal: Ability to demonstrate self-control will improve Outcome: Completed/Met   Problem: Health Behavior/Discharge Planning: Goal: Identification of resources available to assist in meeting health care needs will improve Outcome: Completed/Met Goal: Compliance with treatment plan for underlying cause of condition will improve Outcome: Completed/Met   Problem: Physical Regulation: Goal: Ability to maintain clinical measurements within normal limits will improve Outcome: Completed/Met   Problem: Safety: Goal: Periods of time without injury will increase Outcome: Completed/Met   Problem: Activity: Goal: Will identify at least one activity in which they can participate Outcome: Completed/Met   Problem: Coping: Goal: Ability to identify and develop effective coping behavior will improve Outcome: Completed/Met Goal: Ability to interact with others will improve Outcome: Completed/Met Goal: Demonstration of participation in decision-making regarding own care will improve Outcome: Completed/Met Goal: Ability to use eye contact when communicating with others will improve Outcome: Completed/Met   Problem: Health Behavior/Discharge Planning: Goal: Identification of resources available to assist in meeting health care needs will improve Outcome: Completed/Met   Problem: Self-Concept: Goal: Will verbalize positive feelings about self Outcome: Completed/Met   Problem:  Education: Goal: Knowledge of disease or condition will improve Outcome: Completed/Met Goal: Understanding of discharge needs will improve Outcome: Completed/Met   Problem: Health Behavior/Discharge Planning: Goal: Ability to identify changes in lifestyle to reduce recurrence of condition will improve Outcome: Completed/Met Goal: Identification of resources available to assist in meeting health care needs will improve Outcome: Completed/Met   Problem: Physical Regulation: Goal: Complications related to the disease process, condition or treatment will be avoided or minimized Outcome: Completed/Met   Problem: Safety: Goal: Ability to remain free from injury will improve Outcome: Completed/Met   Problem: Education: Goal: Ability to make informed decisions regarding treatment will improve Outcome: Completed/Met   Problem: Coping: Goal: Coping ability will improve Outcome: Completed/Met   Problem: Health Behavior/Discharge Planning: Goal: Identification of resources available to assist in meeting health care needs will improve Outcome: Completed/Met   Problem: Medication: Goal: Compliance with prescribed medication regimen will improve Outcome: Completed/Met   Problem: Self-Concept: Goal: Ability to disclose and discuss suicidal ideas will improve Outcome: Completed/Met Goal: Will verbalize positive feelings about self Outcome: Completed/Met   Problem: Activity: Goal: Will identify at least one activity in which they can participate Outcome: Completed/Met   Problem: Coping: Goal: Ability to identify and develop effective coping behavior will improve Outcome: Completed/Met Goal: Ability to interact with others will improve Outcome: Completed/Met Goal: Demonstration of participation in decision-making regarding own care will improve Outcome: Completed/Met Goal: Ability to use eye contact when communicating with others will improve Outcome: Completed/Met   Problem:  Health Behavior/Discharge Planning: Goal: Identification of resources available to assist in meeting health care needs will improve Outcome: Completed/Met   Problem: Self-Concept: Goal: Will verbalize positive feelings about self Outcome: Completed/Met   Problem: Spiritual Needs Goal: Ability to function at adequate level Outcome: Completed/Met

## 2017-07-28 NOTE — Progress Notes (Signed)
Discharge note:  Patient discharged home per MD order.  Patient received all personal belongings from unit and locker.  Reviewed AVS/transition record with patient and she indicated understanding.  She denies any thoughts of self harm.  Patient left ambulatory with family.

## 2017-07-28 NOTE — Tx Team (Signed)
Interdisciplinary Treatment and Diagnostic Plan Update  07/28/2017 Time of Session: 1040AM Crystal Duarte MRN: [id number]  Principal Diagnosis: MDD, recurrent, severe, without psychosis  Secondary Diagnoses: Principal Problem:   MDD (major depressive disorder), recurrent severe, without psychosis ([place])   Current Medications:  Current Facility-Administered Medications  Medication Dose Route Frequency Provider Last Rate Last Dose  . alum & mag hydroxide-simeth (MAALOX/MYLANTA) 200-200-20 MG/5ML suspension 30 mL  30 mL Oral Q4H PRN Starkes, Takia S, FNP      . busPIRone (BUSPAR) tablet 15 mg  15 mg Oral TID Ethelene Hal, NP   15 mg at 07/28/17 0820  . DULoxetine (CYMBALTA) DR capsule 60 mg  60 mg Oral BID Cobos, Myer Peer, MD   60 mg at 07/28/17 0820  . gabapentin (NEURONTIN) capsule 400 mg  400 mg Oral TID Money, Lowry Ram, FNP   400 mg at 07/28/17 0820  . magnesium hydroxide (MILK OF MAGNESIA) suspension 30 mL  30 mL Oral Daily PRN Nanci Pina, FNP   30 mL at 07/25/17 1052  . naproxen (NAPROSYN) tablet 500 mg  500 mg Oral BID PRN Cobos, Myer Peer, MD   500 mg at 07/27/17 3810  . QUEtiapine (SEROQUEL) tablet 25 mg  25 mg Oral BID PRN Money, Lowry Ram, FNP   25 mg at 07/26/17 1314  . QUEtiapine (SEROQUEL) tablet 300 mg  300 mg Oral QHS Money, Lowry Ram, FNP   300 mg at 07/27/17 2109  . traZODone (DESYREL) tablet 100 mg  100 mg Oral QHS PRN,MR X 1 Lindon Romp A, NP   100 mg at 07/27/17 2110   PTA Medications: Medications Prior to Admission  Medication Sig Dispense Refill Last Dose  . acetaminophen (TYLENOL) 500 MG tablet Take 1,000 mg by mouth every 6 (six) hours as needed for headache.   07/16/2017 at Unknown time  . busPIRone (BUSPAR) 15 MG tablet Take 15 mg by mouth 3 (three) times daily.   07/17/2017 at Unknown time  . Calcium-Vitamin D (CALTRATE 600 PLUS-VIT D PO) Take 600 mg by mouth.   07/17/2017 at Unknown time  . cetirizine (ZYRTEC) 10 MG tablet Take 10 mg by mouth as  needed for allergies.   Past Week at Unknown time  . Cholecalciferol (VITAMIN D3 PO) Take 1,200 mg by mouth daily.   07/17/2017 at Unknown time  . clonazePAM (KLONOPIN) 1 MG tablet Take 1 mg by mouth 3 (three) times daily as needed for anxiety.    Not Taking at Unknown time  . DULoxetine (CYMBALTA) 30 MG capsule Take 90 mg by mouth daily. Pt takes a total dose of 90mg  of duloxetine. Either by taking three 30mg  or one 60mg  and one 30mg  capsule.   07/17/2017 at Unknown time  . fluticasone (FLONASE) 50 MCG/ACT nasal spray Place 2 sprays into both nostrils daily. 16 g 6 Past Week at Unknown time  . folic acid (FOLVITE) 1 MG tablet Take 1 mg by mouth daily.   07/17/2017 at Unknown time  . hydrOXYzine (VISTARIL) 25 MG capsule Take 25 mg by mouth as needed.   Past Week at Unknown time  . mometasone-formoterol (DULERA) 100-5 MCG/ACT AERO Inhale 2 puffs into the lungs 2 (two) times daily. 1 Inhaler 11 unknown  . Multiple Vitamin (MULTIVITAMIN) tablet Take 1 tablet by mouth daily. Reported on 10/08/2015   07/17/2017 at Unknown time  . naphazoline-pheniramine (NAPHCON-A) 0.025-0.3 % ophthalmic solution Place 2 drops into both eyes 4 (four) times daily as needed for eye irritation.  Past Week at Unknown time  . QUEtiapine (SEROQUEL) 50 MG tablet Take 100 mg by mouth at bedtime.   07/16/2017 at Unknown time  . traZODone (DESYREL) 100 MG tablet Take 200 mg by mouth at bedtime.    07/17/2017 at Unknown time    Patient Stressors: Medication change or noncompliance Substance abuse  Patient Strengths: Curator fund of knowledge Motivation for treatment/growth  Treatment Modalities: Medication Management, Group therapy, Case management,  1 to 1 session with clinician, Psychoeducation, Recreational therapy.   Physician Treatment Plan for Primary Diagnosis: MDD, recurrent, severe, without psychosis  Medication Management: Evaluate patient's response, side effects, and tolerance of medication  regimen.  Therapeutic Interventions: 1 to 1 sessions, Unit Group sessions and Medication administration.  Evaluation of Outcomes: Adequate for Discharge  Physician Treatment Plan for Secondary Diagnosis: Principal Problem:   MDD (major depressive disorder), recurrent severe, without psychosis ([place])  Medication Management: Evaluate patient's response, side effects, and tolerance of medication regimen.  Therapeutic Interventions: 1 to 1 sessions, Unit Group sessions and Medication administration.  Evaluation of Outcomes: Adequate for Discharge   RN Treatment Plan for Primary Diagnosis: MDD, recurrent, severe, without psychosis Long Term Goal(s): Knowledge of disease and therapeutic regimen to maintain health will improve  Short Term Goals: Ability to remain free from injury will improve, Ability to demonstrate self-control and Ability to disclose and discuss suicidal ideas  Medication Management: RN will administer medications as ordered by provider, will assess and evaluate patient's response and provide education to patient for prescribed medication. RN will report any adverse and/or side effects to prescribing provider.  Therapeutic Interventions: 1 on 1 counseling sessions, Psychoeducation, Medication administration, Evaluate responses to treatment, Monitor vital signs and CBGs as ordered, Perform/monitor CIWA, COWS, AIMS and Fall Risk screenings as ordered, Perform wound care treatments as ordered.  Evaluation of Outcomes: Adequate for Discharge   LCSW Treatment Plan for Primary Diagnosis: MDD, recurrent, severe, without psychosis Long Term Goal(s): Safe transition to appropriate next level of care at discharge, Engage patient in therapeutic group addressing interpersonal concerns.  Short Term Goals: Engage patient in aftercare planning with referrals and resources, Facilitate patient progression through stages of change regarding substance use diagnoses and concerns and Identify  triggers associated with mental health/substance abuse issues  Therapeutic Interventions: Assess for all discharge needs, 1 to 1 time with Social worker, Explore available resources and support systems, Assess for adequacy in community support network, Educate family and significant other(s) on suicide prevention, Complete Psychosocial Assessment, Interpersonal group therapy.  Evaluation of Outcomes: Adequate for Discharge   Progress in Treatment: Attending groups: Yes Participating in groups: Yes Taking medication as prescribed: Yes. Toleration medication: Yes. Family/Significant other contact made: Yes, individual(s) contacted:  son Patient understands diagnosis: Yes. Discussing patient identified problems/goals with staff: Yes. Medical problems stabilized or resolved: Yes. Denies suicidal/homicidal ideation: Yes. Issues/concerns per patient self-inventory: No. Other: n/a   New problem(s) identified: No, Describe:  n/a  New Short Term/Long Term Goal(s): detox, medication management for mood stabilization; elimination of SI thoughts; development of comprehensive mental wellness/sobriety plan.   Patient Goal: "to safely detox from klonopin and get on the right psychiatric medications."  Discharge Plan or Barriers: Patient is returning home with her son and will follow up with [facility] on Tuesday, 08/01/17 for additional support and also with her current providers at [facility] for medication management and therapy services.   Reason for Continuation of Hospitalization: None   Estimated Length of Stay: Discharging,  Friday 07/28/17  Attendees: Patient: Crystal Duarte 07/24/2017   Physician: Dr. Parke Poisson, MD; Dr. Myles Lipps, MD 07/24/2017   Nursing: Grayland Ormond, RN; [place], [place] 07/24/2017   RN Care Manager:X 07/24/2017   Social Worker: Lurline Idol, LCSW; Radonna Ricker, [place] 07/24/2017   Recreational Therapist: X 07/24/2017   Other: x 07/24/2017   Other: x 07/24/2017    Other:x 07/24/2017        Scribe for Treatment Team: Marylee Floras, [place] 07/28/2017 10:20 AM

## 2017-07-28 NOTE — Progress Notes (Signed)
Recreation Therapy Notes  Date: 4.26.19 Time: 0930 Location: [address] Dayroom  Group Topic: Stress Management  Goal Area(s) Addresses:  Patient will verbalize importance of using healthy stress management.  Patient will identify positive emotions associated with healthy stress management.   Intervention: Stress Management  Activity : Gratitude Meditation.  LRT played a meditation on gratitude.  Patients were to listen and follow along as the meditation played to fully engage in the activity.  Education:  Stress Management, Discharge Planning.   Education Outcome: Acknowledges edcuation/In group clarification offered/Needs additional education  Clinical Observations/Feedback: Pt did not attend group.    Victorino Sparrow, LRT/CTRS         Victorino Sparrow A 07/28/2017 10:32 AM
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
## 2017-08-01 ENCOUNTER — Telehealth (HOSPITAL_COMMUNITY): Payer: Self-pay | Admitting: Psychology

## 2017-08-07 DIAGNOSIS — F411 Generalized anxiety disorder: Secondary | ICD-10-CM | POA: Diagnosis not present

## 2017-08-07 DIAGNOSIS — R69 Illness, unspecified: Secondary | ICD-10-CM | POA: Diagnosis not present

## 2017-08-08 DIAGNOSIS — R69 Illness, unspecified: Secondary | ICD-10-CM | POA: Diagnosis not present

## 2017-08-14 ENCOUNTER — Encounter (HOSPITAL_COMMUNITY): Payer: Self-pay | Admitting: Psychiatry

## 2017-08-14 ENCOUNTER — Other Ambulatory Visit (HOSPITAL_COMMUNITY): Payer: Medicare HMO | Attending: Psychiatry | Admitting: Psychiatry

## 2017-08-14 DIAGNOSIS — F419 Anxiety disorder, unspecified: Secondary | ICD-10-CM | POA: Insufficient documentation

## 2017-08-14 DIAGNOSIS — Z9889 Other specified postprocedural states: Secondary | ICD-10-CM | POA: Insufficient documentation

## 2017-08-14 DIAGNOSIS — Z888 Allergy status to other drugs, medicaments and biological substances status: Secondary | ICD-10-CM | POA: Diagnosis not present

## 2017-08-14 DIAGNOSIS — Z825 Family history of asthma and other chronic lower respiratory diseases: Secondary | ICD-10-CM | POA: Insufficient documentation

## 2017-08-14 DIAGNOSIS — Z8249 Family history of ischemic heart disease and other diseases of the circulatory system: Secondary | ICD-10-CM | POA: Insufficient documentation

## 2017-08-14 DIAGNOSIS — Z90710 Acquired absence of both cervix and uterus: Secondary | ICD-10-CM | POA: Diagnosis not present

## 2017-08-14 DIAGNOSIS — F329 Major depressive disorder, single episode, unspecified: Secondary | ICD-10-CM | POA: Diagnosis not present

## 2017-08-14 DIAGNOSIS — Z8041 Family history of malignant neoplasm of ovary: Secondary | ICD-10-CM | POA: Diagnosis not present

## 2017-08-14 DIAGNOSIS — Z87891 Personal history of nicotine dependence: Secondary | ICD-10-CM | POA: Insufficient documentation

## 2017-08-14 DIAGNOSIS — R45851 Suicidal ideations: Secondary | ICD-10-CM | POA: Insufficient documentation

## 2017-08-14 DIAGNOSIS — Z801 Family history of malignant neoplasm of trachea, bronchus and lung: Secondary | ICD-10-CM | POA: Insufficient documentation

## 2017-08-14 DIAGNOSIS — Z79899 Other long term (current) drug therapy: Secondary | ICD-10-CM | POA: Insufficient documentation

## 2017-08-14 DIAGNOSIS — F332 Major depressive disorder, recurrent severe without psychotic features: Secondary | ICD-10-CM

## 2017-08-14 DIAGNOSIS — Z882 Allergy status to sulfonamides status: Secondary | ICD-10-CM | POA: Diagnosis not present

## 2017-08-14 DIAGNOSIS — R69 Illness, unspecified: Secondary | ICD-10-CM | POA: Diagnosis not present
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
## 2017-08-14 NOTE — Progress Notes (Signed)
    Daily Group Progress Note  Program: IOP  Group Time: 9:00-12:00  Participation Level: Active  Behavioral Response: Appropriate  Type of Therapy:  Group Therapy  Summary of Progress:  Pt.'s first day in group. Pt. Met with the case manager for intake assessment. Pt. shared that she was recently discharged from inpatient and met with her outpatient psychiatrist. Pt. Shared her frustration with her current medications, recent weight gain, and feeling that she was not getting better. Pt. Discussed significant problems setting healthy boundaries with people especially her family and her responsibility taking care of elderly mother. Pt. stated "My problem is that I don't have any boundaries". Pt. Has some insight as to how her lack of boundaries contributes to her depression. Pt. Participated in group discussion about developing healthy boundaries, developing consistent self-care, and the importance of assertive communication in relationships.      Nancie Neas, LPC

## 2017-08-15 ENCOUNTER — Ambulatory Visit: Payer: Medicare HMO | Admitting: Internal Medicine

## 2017-08-15 ENCOUNTER — Other Ambulatory Visit (HOSPITAL_COMMUNITY): Payer: Medicare HMO | Admitting: Psychiatry

## 2017-08-15 DIAGNOSIS — Z8249 Family history of ischemic heart disease and other diseases of the circulatory system: Secondary | ICD-10-CM | POA: Diagnosis not present

## 2017-08-15 DIAGNOSIS — Z801 Family history of malignant neoplasm of trachea, bronchus and lung: Secondary | ICD-10-CM | POA: Diagnosis not present

## 2017-08-15 DIAGNOSIS — F332 Major depressive disorder, recurrent severe without psychotic features: Secondary | ICD-10-CM

## 2017-08-15 DIAGNOSIS — Z8041 Family history of malignant neoplasm of ovary: Secondary | ICD-10-CM | POA: Diagnosis not present

## 2017-08-15 DIAGNOSIS — F329 Major depressive disorder, single episode, unspecified: Secondary | ICD-10-CM | POA: Diagnosis not present

## 2017-08-15 DIAGNOSIS — R69 Illness, unspecified: Secondary | ICD-10-CM | POA: Diagnosis not present

## 2017-08-15 DIAGNOSIS — Z90710 Acquired absence of both cervix and uterus: Secondary | ICD-10-CM | POA: Diagnosis not present

## 2017-08-15 DIAGNOSIS — Z0100 Encounter for examination of eyes and vision without abnormal findings: Secondary | ICD-10-CM | POA: Diagnosis not present

## 2017-08-15 DIAGNOSIS — R45851 Suicidal ideations: Secondary | ICD-10-CM | POA: Diagnosis not present

## 2017-08-15 DIAGNOSIS — H524 Presbyopia: Secondary | ICD-10-CM | POA: Diagnosis not present

## 2017-08-15 DIAGNOSIS — Z87891 Personal history of nicotine dependence: Secondary | ICD-10-CM | POA: Diagnosis not present

## 2017-08-15 DIAGNOSIS — F419 Anxiety disorder, unspecified: Secondary | ICD-10-CM | POA: Diagnosis not present

## 2017-08-15 DIAGNOSIS — Z825 Family history of asthma and other chronic lower respiratory diseases: Secondary | ICD-10-CM | POA: Diagnosis not present

## 2017-08-15 DIAGNOSIS — Z9889 Other specified postprocedural states: Secondary | ICD-10-CM | POA: Diagnosis not present

## 2017-08-15 NOTE — Progress Notes (Signed)
Psychiatric Initial Adult Assessment   Patient Identification: Crystal Duarte MRN:  [id number] Date of Evaluation:  08/15/2017 Referral Source: Psychiatrist Jonnalagadda  Chief Complaint:  Depression  Visit Diagnosis:    ICD-10-CM   1. MDD (major depressive disorder), recurrent severe, without psychosis ([place]) F33.2     History of Present Illness:  Crystal Duarte 57 y.o caucasian female. Present with chronic depression and anxiety. Reports she was recently treated inpatient admission for passive suicidal ideations. Reports perviouse suicidal attempts in the past.  States she was tritiated off klonopin while inpatient. Reports "I am adjusting to the new medication that I was started on". Patient reports her medication was adjusted and states she is currently  taken Gabapentin, Seroquel, Trazodone BuSpar and Duloxetine. Crystal Duarte reports taking and tolerating medications well. Patient continues to ruminative with  Klonopin as she reports she has been taken Klonopin for years.  Reported multiple inpatient admissions.  Reports she completed electroconvulsive therapy treatment in the past. Reports she felt this treatment was helpful in the past.  Patient reports she continues to struggle with depression, anxiety and insomnia. Reports a recent visit to her psychiatrist and she and her son didn't feel that meeting was productive. Report she will consider finding a new psychiatrist. Patient is requesting geno testing while in this program. Support, encouragement and reassurance was provided.  Associated Signs/Symptoms: Depression Symptoms:  depressed mood, suicidal thoughts without plan, anxiety, disturbed sleep, (Hypo) Manic Symptoms:  Distractibility, Irritable Mood, Anxiety Symptoms:  Excessive Worry, Psychotic Symptoms:  Hallucinations: None PTSD Symptoms: Avoidance:  None  Past Psychiatric History:   Previous Psychotropic Medications: yes  Substance Abuse History in the last 12 months:   No.  Consequences of Substance Abuse: NA  Past Medical History:  Past Medical History:  Diagnosis Date  . Coccidioidomycosis, pulmonary ([place])   . Depression   . Hx of adenomatous polyp of colon 12/04/2014  . Osteopenia 11/2016   T score -2.2 FRAX 17%/2.4%  . Panic attack     Past Surgical History:  Procedure Laterality Date  . ABDOMINAL HYSTERECTOMY    . COLONOSCOPY  2001   hemorrhoidectomy  . LUNG SURGERY  2001   VATS surgery   . OVARIAN CYST REMOVAL  1970's  . VESICOVAGINAL FISTULA CLOSURE W/ TAH  2005    Family Psychiatric History: Patient reports multiple inpatient admissions. Crystal Duarte, [place] and [facility] inpatient admissions.   Reports ECT 2016.  Reports she has a psychiatrist and a therapist. Reported overdose in the past.   Family History:  Family History  Problem Relation Age of Onset  . Asthma Mother   . Ovarian cancer Mother   . Cancer Mother        OVARIAN  . Varicose Veins Mother   . Heart disease Father   . Peripheral vascular disease Father   . Cancer Maternal Grandmother        LUNG- LUNG  . Colon cancer Neg Hx     Social History:   Social History   Socioeconomic History  . Marital status: Divorced    Spouse name: Not on file  . Number of children: 2  . Years of education: Not on file  . Highest education level: Not on file  Occupational History  . Occupation: Forensic psychologist: [employer]  . Financial resource strain: Not on file  . Food insecurity:    Worry: Not on file    Inability: Not on file  . Transportation needs:  Medical: Not on file    Non-medical: Not on file  Tobacco Use  . Smoking status: Former Smoker    Packs/day: 1.00    Years: 42.00    Pack years: 42.00    Types: Cigarettes    Last attempt to quit: 09/25/2016    Years since quitting: 0.8  . Smokeless tobacco: Former Systems developer    Quit date: 06/13/2016  Substance and Sexual Activity  . Alcohol use: No    Alcohol/week: 0.0 oz  . Drug use: No   . Sexual activity: Not Currently    Birth control/protection: Surgical  Lifestyle  . Physical activity:    Days per week: Not on file    Minutes per session: Not on file  . Stress: Not on file  Relationships  . Social connections:    Talks on phone: Not on file    Gets together: Not on file    Attends religious service: Not on file    Active member of club or organization: Not on file    Attends meetings of clubs or organizations: Not on file    Relationship status: Not on file  Other Topics Concern  . Not on file  Social History Narrative  . Not on file    Additional Social History:   Allergies:   Allergies  Allergen Reactions  . Latuda [Lurasidone Hcl] Anxiety  . Sulfa Antibiotics Rash    Only in sunlight     Metabolic Disorder Labs: Lab Results  Component Value Date   HGBA1C 5.4 07/21/2017   MPG 108.28 07/21/2017   MPG 114 08/28/2012   No results found for: PROLACTIN Lab Results  Component Value Date   CHOL 200 07/21/2017   TRIG 208 (H) 07/21/2017   HDL 54 07/21/2017   CHOLHDL 3.7 07/21/2017   VLDL 42 (H) 07/21/2017   LDLCALC 104 (H) 07/21/2017   LDLCALC 119 (H) 10/08/2015     Current Medications: Current Outpatient Medications  Medication Sig Dispense Refill  . Calcium-Vitamin D (CALTRATE 600 PLUS-VIT D PO) Take 600 mg by mouth.    . cetirizine (ZYRTEC) 10 MG tablet Take 10 mg by mouth as needed for allergies.    . Cholecalciferol (VITAMIN D3 PO) Take 1,200 mg by mouth daily.    . DULoxetine (CYMBALTA) 60 MG capsule Take 1 capsule (60 mg total) by mouth 2 (two) times daily. 60 capsule 0  . gabapentin (NEURONTIN) 400 MG capsule Take 1 capsule (400 mg total) by mouth 3 (three) times daily. 90 capsule 0  . Multiple Vitamin (MULTIVITAMIN) tablet Take 1 tablet by mouth daily. Reported on 10/08/2015    . QUEtiapine (SEROQUEL) 300 MG tablet Take 1 tablet (300 mg total) by mouth at bedtime. 30 tablet 0  . traZODone (DESYREL) 100 MG tablet Take 1 tablet (100 mg  total) by mouth at bedtime as needed and may repeat dose one time if needed for sleep. 30 tablet 0   No current facility-administered medications for this visit.     Neurologic: Headache: No Seizure: No Paresthesias:No  Musculoskeletal: Strength & Muscle Tone: within normal limits Gait & Station: normal Patient leans: N/A  Psychiatric Specialty Exam: ROS  There were no vitals taken for this visit.There is no height or weight on file to calculate BMI.  General Appearance: Casual  Eye Contact:  Fair  Speech:  Clear and Coherent  Volume:  Normal  Mood:  Anxious and Depressed  Affect:  Congruent  Thought Process:  Coherent  Orientation:  Full (Time,  Place, and Person)  Thought Content:  Hallucinations: None and Rumination  Suicidal Thoughts:  No  Homicidal Thoughts:  No  Memory:  Immediate;   Fair Recent;   Fair Remote;   Fair  Judgement:  Fair  Insight:  Fair  Psychomotor Activity:  Normal  Concentration:  Concentration: Fair  Recall:  [employer] of Knowledge:Fair  Language: Fair  Akathisia:  No  Handed:  Right  AIMS (if indicated):    Assets:  Communication Skills Desire for Improvement Social Support  ADL's:  Intact  Cognition: WNL  Sleep:      Treatment Plan Summary: Admit to IOP Orders placed for Gene sight testing Medication management: Continue Cymbalta, Neurontin Seroquel and Trazodone Keep follow-up appointment with MD Jonnalagadda   Treatment plan was reviewed and agreed upon by NP. T.Maribelle Hopple and patient Crystal Duarte need for co   Derrill Center, NP 5/14/20195:10 PM

## 2017-08-15 NOTE — Progress Notes (Signed)
    Daily Group Progress Note  Program: IOP  Group Time: 9:00-12:00  Participation Level: Active  Behavioral Response: Appropriate  Type of Therapy:  Group Therapy  Summary of Progress: Pt. Presents as talkative, alert, generally dysphoric mood. Pt. Continues to report dissatisfaction with her medication, difficulty sleeping, and weight gain. Pt. Participated in discussion about making behavioral changes to address weight gain. Pt. Participated in grief and loss session with the Chaplain.     Nancie Neas, LPC

## 2017-08-16 ENCOUNTER — Encounter (HOSPITAL_COMMUNITY): Payer: Self-pay | Admitting: Psychiatry

## 2017-08-16 ENCOUNTER — Other Ambulatory Visit (HOSPITAL_COMMUNITY): Payer: Medicare HMO | Admitting: Psychiatry

## 2017-08-16 DIAGNOSIS — F900 Attention-deficit hyperactivity disorder, predominantly inattentive type: Secondary | ICD-10-CM | POA: Diagnosis not present

## 2017-08-16 DIAGNOSIS — Z90710 Acquired absence of both cervix and uterus: Secondary | ICD-10-CM | POA: Diagnosis not present

## 2017-08-16 DIAGNOSIS — R45851 Suicidal ideations: Secondary | ICD-10-CM | POA: Diagnosis not present

## 2017-08-16 DIAGNOSIS — Z825 Family history of asthma and other chronic lower respiratory diseases: Secondary | ICD-10-CM | POA: Diagnosis not present

## 2017-08-16 DIAGNOSIS — F322 Major depressive disorder, single episode, severe without psychotic features: Secondary | ICD-10-CM | POA: Diagnosis not present

## 2017-08-16 DIAGNOSIS — F332 Major depressive disorder, recurrent severe without psychotic features: Secondary | ICD-10-CM

## 2017-08-16 DIAGNOSIS — F419 Anxiety disorder, unspecified: Secondary | ICD-10-CM | POA: Diagnosis not present

## 2017-08-16 DIAGNOSIS — R69 Illness, unspecified: Secondary | ICD-10-CM | POA: Diagnosis not present

## 2017-08-16 DIAGNOSIS — Z8041 Family history of malignant neoplasm of ovary: Secondary | ICD-10-CM | POA: Diagnosis not present

## 2017-08-16 DIAGNOSIS — Z801 Family history of malignant neoplasm of trachea, bronchus and lung: Secondary | ICD-10-CM | POA: Diagnosis not present

## 2017-08-16 DIAGNOSIS — Z8249 Family history of ischemic heart disease and other diseases of the circulatory system: Secondary | ICD-10-CM | POA: Diagnosis not present

## 2017-08-16 DIAGNOSIS — Z87891 Personal history of nicotine dependence: Secondary | ICD-10-CM | POA: Diagnosis not present

## 2017-08-16 DIAGNOSIS — F329 Major depressive disorder, single episode, unspecified: Secondary | ICD-10-CM | POA: Diagnosis not present

## 2017-08-16 DIAGNOSIS — Z9889 Other specified postprocedural states: Secondary | ICD-10-CM | POA: Diagnosis not present

## 2017-08-17 ENCOUNTER — Other Ambulatory Visit (HOSPITAL_COMMUNITY): Payer: Medicare HMO

## 2017-08-17 NOTE — Progress Notes (Signed)
    Daily Group Progress Note  Program: IOP  Group Time: 9:00-12:00  Participation Level: Active  Behavioral Response: Appropriate  Type of Therapy:  Group Therapy  Summary of Progress: Pt. Reported that she was doing well, presented with significantly brightened affect, talkative, engaged in the group process. Pt. Requested feedback from the group about how to be present for her sister who lives in [place] who has a cat that is dying. Pt. Participated in discussion facilitated by Wellness Director Frederich Balding about developing a wellness plan.     Nancie Neas, LPC

## 2017-08-18 ENCOUNTER — Other Ambulatory Visit (HOSPITAL_COMMUNITY): Payer: Medicare HMO

## 2017-08-21 ENCOUNTER — Other Ambulatory Visit (HOSPITAL_COMMUNITY): Payer: Medicare HMO

## 2017-08-22 ENCOUNTER — Encounter (HOSPITAL_COMMUNITY): Payer: Self-pay | Admitting: Family

## 2017-08-22 ENCOUNTER — Other Ambulatory Visit (HOSPITAL_COMMUNITY): Payer: Medicare HMO | Admitting: Psychiatry

## 2017-08-22 DIAGNOSIS — Z87891 Personal history of nicotine dependence: Secondary | ICD-10-CM | POA: Diagnosis not present

## 2017-08-22 DIAGNOSIS — R45851 Suicidal ideations: Secondary | ICD-10-CM | POA: Diagnosis not present

## 2017-08-22 DIAGNOSIS — F419 Anxiety disorder, unspecified: Secondary | ICD-10-CM | POA: Diagnosis not present

## 2017-08-22 DIAGNOSIS — F329 Major depressive disorder, single episode, unspecified: Secondary | ICD-10-CM | POA: Diagnosis not present

## 2017-08-22 DIAGNOSIS — F332 Major depressive disorder, recurrent severe without psychotic features: Secondary | ICD-10-CM

## 2017-08-22 DIAGNOSIS — Z90710 Acquired absence of both cervix and uterus: Secondary | ICD-10-CM | POA: Diagnosis not present

## 2017-08-22 DIAGNOSIS — Z9889 Other specified postprocedural states: Secondary | ICD-10-CM | POA: Diagnosis not present

## 2017-08-22 DIAGNOSIS — Z825 Family history of asthma and other chronic lower respiratory diseases: Secondary | ICD-10-CM | POA: Diagnosis not present

## 2017-08-22 DIAGNOSIS — Z8041 Family history of malignant neoplasm of ovary: Secondary | ICD-10-CM | POA: Diagnosis not present

## 2017-08-22 DIAGNOSIS — Z801 Family history of malignant neoplasm of trachea, bronchus and lung: Secondary | ICD-10-CM | POA: Diagnosis not present

## 2017-08-22 DIAGNOSIS — Z8249 Family history of ischemic heart disease and other diseases of the circulatory system: Secondary | ICD-10-CM | POA: Diagnosis not present

## 2017-08-22 DIAGNOSIS — R69 Illness, unspecified: Secondary | ICD-10-CM | POA: Diagnosis not present

## 2017-08-22 NOTE — Progress Notes (Signed)
    Daily Group Progress Note  Program: IOP  Group Time: 9:00-12:00  Participation Level: Active  Behavioral Response: Appropriate  Type of Therapy:  Group Therapy  Summary of Progress: Pt. Presented as talkative, engaged in the group process. Pt. Discussed her feelings of shock and sadness upon learning of her friend's breast cancer diagnosis. Pt. Discussed financial stress and how it contributes to stress in her relationships especially with there mother. Pt. Participated in discussion about use of the grounding series, meditation, and breathing to manage anxiety, depression, and stress.    Nancie Neas, LPC

## 2017-08-22 NOTE — Progress Notes (Signed)
Chart and lab results reviewed with patient for [facility] Psychotropic testing. Patient was provided a copy to follow-up with her Attending Psychiatrist. No additional concerns noted at this visit.  T.Lewis NP 08/22/2017

## 2017-08-23 ENCOUNTER — Encounter (HOSPITAL_COMMUNITY): Payer: Self-pay | Admitting: Family

## 2017-08-23 ENCOUNTER — Other Ambulatory Visit (HOSPITAL_COMMUNITY): Payer: Medicare HMO | Admitting: Psychiatry

## 2017-08-23 DIAGNOSIS — Z87891 Personal history of nicotine dependence: Secondary | ICD-10-CM | POA: Diagnosis not present

## 2017-08-23 DIAGNOSIS — R69 Illness, unspecified: Secondary | ICD-10-CM | POA: Diagnosis not present

## 2017-08-23 DIAGNOSIS — Z801 Family history of malignant neoplasm of trachea, bronchus and lung: Secondary | ICD-10-CM | POA: Diagnosis not present

## 2017-08-23 DIAGNOSIS — F332 Major depressive disorder, recurrent severe without psychotic features: Secondary | ICD-10-CM

## 2017-08-23 DIAGNOSIS — F419 Anxiety disorder, unspecified: Secondary | ICD-10-CM | POA: Diagnosis not present

## 2017-08-23 DIAGNOSIS — Z825 Family history of asthma and other chronic lower respiratory diseases: Secondary | ICD-10-CM | POA: Diagnosis not present

## 2017-08-23 DIAGNOSIS — F329 Major depressive disorder, single episode, unspecified: Secondary | ICD-10-CM | POA: Diagnosis not present

## 2017-08-23 DIAGNOSIS — Z90710 Acquired absence of both cervix and uterus: Secondary | ICD-10-CM | POA: Diagnosis not present

## 2017-08-23 DIAGNOSIS — Z8041 Family history of malignant neoplasm of ovary: Secondary | ICD-10-CM | POA: Diagnosis not present

## 2017-08-23 DIAGNOSIS — Z9889 Other specified postprocedural states: Secondary | ICD-10-CM | POA: Diagnosis not present

## 2017-08-23 DIAGNOSIS — Z8249 Family history of ischemic heart disease and other diseases of the circulatory system: Secondary | ICD-10-CM | POA: Diagnosis not present

## 2017-08-23 DIAGNOSIS — R45851 Suicidal ideations: Secondary | ICD-10-CM | POA: Diagnosis not present

## 2017-08-23 NOTE — Progress Notes (Signed)
    Daily Group Progress Note  Program: IOP  Group Time: 9:00-12:00  Participation Level: Active  Behavioral Response: Appropriate  Type of Therapy:  Group Therapy  Summary of Progress: Pt presents as talkative, alert, engaged in the group process. Pt. Discussed her relationships with her mother, children, and grandchildren. Pt. Discussed that her children provide an important source of accountability for her. Pt.. Listened to reflective reading about self-compassion and discussion about components of self-compassion- mindfulness, connection to others, and kindness to self.       Nancie Neas, LPC

## 2017-08-23 NOTE — Progress Notes (Signed)
  [facility] Discharge Summary  Crystal Duarte [id number]  Admission date: 08/14/2017 Discharge date: 08/23/2017  Reason for admission: Depression and Anxiety   Per assessment note- Lias Pajak 57 y.o caucasian female. Present with chronic depression and anxiety. Reports she was recently treated inpatient admission for passive suicidal ideations. Reports perviouse suicidal attempts in the past.  States she was tritiated off klonopin while inpatient. Reports "I am adjusting to the new medication that I was started on". Patient reports her medication was adjusted and states she is currently  taken Gabapentin, Seroquel, Trazodone BuSpar and Duloxetine. Yamina reports taking and tolerating medications well. Patient continues to ruminative with  Klonopin as she reports she has been taken Klonopin for years.Reported multiple inpatient admissions.  Reports she completed electroconvulsive therapy treatment in the past. Reports she felt this treatment was helpful in the past.  Patient reports she continues to struggle with depression, anxiety and insomnia. Reports a recent visit to her psychiatrist and she and her son didn't feel that meeting was productive. Report she will consider finding a new psychiatrist. Patient is requesting geno testing while in this program. Support, encouragement and reassurance was provided  Progress in Program Toward Treatment Goals: Attended and participated in group sessions. [facility] Psychotropic   testing was collected and reviewed during her admissions in intensive outpatient program.  Reports she has an appointment to follow-up with a new psychiatris Dr. Doroteo Glassman  [facility]).  And will continue therapy sessions with Gardiner Ramus on 08/24/2017  Progress (rationale): Ongoing   Take all medications as prescribed. Keep all follow-up appointments as scheduled.  Do not consume alcohol or use illegal drugs while on prescription  medications. Report any adverse effects from your medications to your primary care provider promptly.  In the event of recurrent symptoms or worsening symptoms, call 911, a crisis hotline, or go to the nearest emergency department for evaluation.   Derrill Center, NP 08/23/2017

## 2017-08-24 ENCOUNTER — Other Ambulatory Visit (HOSPITAL_COMMUNITY): Payer: Medicare HMO | Admitting: Psychiatry

## 2017-08-24 NOTE — Progress Notes (Signed)
Crystal Duarte is a 57 y.o., divorced, unemployed, female, who was transitioned from the inpt unit at [facility].  Pt was admitted (July 18, 2017 thru July 28, 2017) due to worsening depressive symptoms with SI (no plan or intent).  Pt still denies any SI.  Also, denies HI or A/V hallucinations. *Pt well known to writer d/t previous admit in [place] 02-18-2014 thru 03-04-14.  CC: previous chart for detailed hx.  Reports having ECT in 2016, but didn't get any better.  "I've had three psych hospitalizations so far this year (2 at [facility] and 1 at [facility]).  Pt sees Dr. Seymour Bars and Dr. Redmond Pulling on an outpatient basis.  According to pt, she is temporarily residing with her son and his family.  Son and his wife are expecting his fourth child in July 2019.  States he, his wife, and her are her support system.    Stressor:  Elderly mother resides with pt.  Been there three yrs.  She moved in whenever her husband (pt's stepfather) passed in [place].  Apparently mother has multiple medical issues; but has been falling.  Most recent fall, she fractured her pelvis.  Nurse aides are coming in around the clock to assist her.  Pt states her mother is very hard to communicate with.  "My sister lives in [place] and she is assertive with her, but it's hard for me to be." Pt has attended IOP and has been very active in all the groups. Pt has been applying coping skills learned.  States since being off the Klonopin, she has gotten closer to her daughter.   Recently got sad news that one of her best friends was diagnosed with breast cancer.  Pt talked to her a couple of days ago and felt better after speaking with her.  A:  D/C today.  F/U with Dr. Lenna Sciara on 09-05-17 and Dr. Doroteo Glassman on 09-07-17.  Encouraged support groups. Con't with DBT group.  R:  Pt receptive.                                                                                  Carlis Abbott, RITA, M.Ed,CNA

## 2017-08-24 NOTE — Patient Instructions (Signed)
D:  Patient completed [facility]-IOP today.  A:  Discharge today.  Follow up with Dr. Lenna Sciara and Dr. Doroteo Glassman.  Encouraged support groups.  R:  Pt receptive.

## 2017-08-25 ENCOUNTER — Other Ambulatory Visit (HOSPITAL_COMMUNITY): Payer: Medicare HMO

## 2017-08-28 ENCOUNTER — Emergency Department (HOSPITAL_BASED_OUTPATIENT_CLINIC_OR_DEPARTMENT_OTHER): Payer: Medicare HMO

## 2017-08-28 ENCOUNTER — Encounter (HOSPITAL_BASED_OUTPATIENT_CLINIC_OR_DEPARTMENT_OTHER): Payer: Self-pay

## 2017-08-28 ENCOUNTER — Other Ambulatory Visit: Payer: Self-pay

## 2017-08-28 ENCOUNTER — Emergency Department (HOSPITAL_BASED_OUTPATIENT_CLINIC_OR_DEPARTMENT_OTHER)
Admission: EM | Admit: 2017-08-28 | Discharge: 2017-08-28 | Disposition: A | Discharge status: Home / Self Care | Payer: Medicare HMO | Attending: Emergency Medicine | Admitting: Emergency Medicine

## 2017-08-28 DIAGNOSIS — J449 Chronic obstructive pulmonary disease, unspecified: Secondary | ICD-10-CM | POA: Diagnosis not present

## 2017-08-28 DIAGNOSIS — Y998 Other external cause status: Secondary | ICD-10-CM | POA: Insufficient documentation

## 2017-08-28 DIAGNOSIS — R69 Illness, unspecified: Secondary | ICD-10-CM | POA: Diagnosis not present

## 2017-08-28 DIAGNOSIS — S6992XA Unspecified injury of left wrist, hand and finger(s), initial encounter: Secondary | ICD-10-CM | POA: Diagnosis present

## 2017-08-28 DIAGNOSIS — F329 Major depressive disorder, single episode, unspecified: Secondary | ICD-10-CM | POA: Insufficient documentation

## 2017-08-28 DIAGNOSIS — S39012A Strain of muscle, fascia and tendon of lower back, initial encounter: Secondary | ICD-10-CM | POA: Diagnosis not present

## 2017-08-28 DIAGNOSIS — Y9351 Activity, roller skating (inline) and skateboarding: Secondary | ICD-10-CM | POA: Insufficient documentation

## 2017-08-28 DIAGNOSIS — S52532A Colles' fracture of left radius, initial encounter for closed fracture: Secondary | ICD-10-CM | POA: Insufficient documentation

## 2017-08-28 DIAGNOSIS — Y929 Unspecified place or not applicable: Secondary | ICD-10-CM | POA: Insufficient documentation

## 2017-08-28 DIAGNOSIS — S52531A Colles' fracture of right radius, initial encounter for closed fracture: Secondary | ICD-10-CM | POA: Diagnosis not present

## 2017-08-28 DIAGNOSIS — M25532 Pain in left wrist: Secondary | ICD-10-CM | POA: Diagnosis not present

## 2017-08-28 DIAGNOSIS — S62102A Fracture of unspecified carpal bone, left wrist, initial encounter for closed fracture: Secondary | ICD-10-CM

## 2017-08-28 DIAGNOSIS — Z79899 Other long term (current) drug therapy: Secondary | ICD-10-CM | POA: Insufficient documentation

## 2017-08-28 DIAGNOSIS — M25512 Pain in left shoulder: Secondary | ICD-10-CM | POA: Diagnosis not present

## 2017-08-28 DIAGNOSIS — Z87891 Personal history of nicotine dependence: Secondary | ICD-10-CM | POA: Diagnosis not present

## 2017-08-28 MED ORDER — TRAMADOL HCL 50 MG PO TABS
100.0000 mg | ORAL_TABLET | Freq: Once | ORAL | Status: AC
  Administered 2017-08-28: 100 mg via ORAL
  Filled 2017-08-28: qty 2

## 2017-08-28 MED ORDER — ACETAMINOPHEN 500 MG PO TABS
1000.0000 mg | ORAL_TABLET | Freq: Once | ORAL | Status: AC
  Administered 2017-08-28: 1000 mg via ORAL
  Filled 2017-08-28: qty 2

## 2017-08-28 MED ORDER — ACETAMINOPHEN 500 MG PO TABS: 1000.0000 mg | ORAL_TABLET | Freq: Four times a day (QID) | ORAL | 0 refills | Status: DC | PRN

## 2017-08-28 MED ORDER — NAPROXEN 500 MG PO TABS: 500.0000 mg | ORAL_TABLET | Freq: Two times a day (BID) | ORAL | 0 refills | Status: DC

## 2017-08-28 MED ORDER — ORPHENADRINE CITRATE ER 100 MG PO TB12: 100.0000 mg | ORAL_TABLET | Freq: Two times a day (BID) | ORAL | 0 refills | Status: DC

## 2017-08-28 MED ORDER — TRAMADOL HCL 50 MG PO TABS: ORAL_TABLET | ORAL | 0 refills | Status: DC

## 2017-08-28 NOTE — ED Provider Notes (Signed)
[facility] EMERGENCY DEPARTMENT Provider Note   CSN: [id number] Arrival date & time: 08/28/17  1550     History   Chief Complaint Chief Complaint  Patient presents with  . Wrist Injury    HPI Crystal Duarte is a 57 y.o. female.  HPI Was rollerblading and fell.  She fell backwards landing on her right buttock and left wrist behind her breaking the fall.  Initially she had pain in her right lower back but that abated.  She denies she has any further pain in the back at this time.  No weakness numbness or tingling to the legs.  She reports she has severe pain in the left wrist which is swollen.  Any movement of the left wrist causes pain that goes all the way up her arm.  Head injury.  No other associated injury.  No thoracic or abdominal pain Past Medical History:  Diagnosis Date  . Coccidioidomycosis, pulmonary ([place])   . Depression   . Hx of adenomatous polyp of colon 12/04/2014  . Osteopenia 11/2016   T score -2.2 FRAX 17%/2.4%  . Panic attack     Patient Active Problem List   Diagnosis Date Noted  . MDD (major depressive disorder), recurrent severe, without psychosis ([place]) 07/18/2017  . DOE (dyspnea on exertion) 12/26/2016  . COPD GOLD II  12/26/2016  . Orthostatic hypotension 12/14/2016  . Chest tightness 12/14/2016  . Weight loss 12/14/2016  . Routine general medical examination at a health care facility 10/09/2015  . H/O vaginal dysplasia 09/15/2015  . Hx of adenomatous polyp of colon 12/04/2014  . Memory loss 10/04/2014  . Affective bipolar disorder ([place]) 03/12/2014  . Peripheral vascular disease, unspecified ([place]) 10/10/2013  . Smoker 08/28/2012  . Menopause 08/28/2012  . Multiple pulmonary nodules 11/12/2010    Past Surgical History:  Procedure Laterality Date  . ABDOMINAL HYSTERECTOMY    . COLONOSCOPY  2001   hemorrhoidectomy  . LUNG SURGERY  2001   VATS surgery   . OVARIAN CYST REMOVAL  1970's  . VESICOVAGINAL FISTULA CLOSURE W/ TAH  2005      OB History    Gravida  2   Para  2   Term      Preterm      AB      Living  2     SAB      TAB      Ectopic      Multiple      Live Births               Home Medications    Prior to Admission medications   Medication Sig Start Date End Date Taking? Authorizing Provider  acetaminophen (TYLENOL) 500 MG tablet Take 2 tablets (1,000 mg total) by mouth every 6 (six) hours as needed. 08/28/17   Charlesetta Shanks, MD  Calcium-Vitamin D (CALTRATE 600 PLUS-VIT D PO) Take 600 mg by mouth.    [provider]  cetirizine (ZYRTEC) 10 MG tablet Take 10 mg by mouth as needed for allergies.    [provider]  Cholecalciferol (VITAMIN D3 PO) Take 1,200 mg by mouth daily.    [provider]  DULoxetine (CYMBALTA) 60 MG capsule Take 1 capsule (60 mg total) by mouth 2 (two) times daily. 07/28/17   Nanci Pina, FNP  gabapentin (NEURONTIN) 400 MG capsule Take 1 capsule (400 mg total) by mouth 3 (three) times daily. 07/28/17   Nanci Pina, FNP  Multiple Vitamin (MULTIVITAMIN)  tablet Take 1 tablet by mouth daily. Reported on 10/08/2015    [provider]  naproxen (NAPROSYN) 500 MG tablet Take 1 tablet (500 mg total) by mouth 2 (two) times daily. 08/28/17   Charlesetta Shanks, MD  orphenadrine (NORFLEX) 100 MG tablet Take 1 tablet (100 mg total) by mouth 2 (two) times daily. 08/28/17   Charlesetta Shanks, MD  QUEtiapine (SEROQUEL) 300 MG tablet Take 1 tablet (300 mg total) by mouth at bedtime. 07/28/17   Nanci Pina, FNP  traMADol (ULTRAM) 50 MG tablet 2 tablets every 6 hours may be taken in combination with acetaminophen and naproxen. 08/28/17   Charlesetta Shanks, MD  traZODone (DESYREL) 100 MG tablet Take 1 tablet (100 mg total) by mouth at bedtime as needed and may repeat dose one time if needed for sleep. 07/28/17   Nanci Pina, FNP    Family History Family History  Problem Relation Age of Onset  . Asthma Mother   . Ovarian cancer Mother    . Cancer Mother        OVARIAN  . Varicose Veins Mother   . Heart disease Father   . Peripheral vascular disease Father   . Cancer Maternal Grandmother        LUNG- LUNG  . Colon cancer Neg Hx     Social History Social History   Tobacco Use  . Smoking status: Former Smoker    Packs/day: 1.00    Years: 42.00    Pack years: 42.00    Types: Cigarettes    Last attempt to quit: 09/25/2016    Years since quitting: 0.9  . Smokeless tobacco: Former Systems developer    Quit date: 06/13/2016  Substance Use Topics  . Alcohol use: No    Alcohol/week: 0.0 oz  . Drug use: No     Allergies   Latuda [lurasidone hcl] and Sulfa antibiotics   Review of Systems Review of Systems 10 Systems reviewed and are negative for acute change except as noted in the HPI.   Physical Exam Updated Vital Signs BP 125/84 (BP Location: Right Arm)   Pulse (!) 103   Temp 98.9 F (37.2 C) (Oral)   Resp 18   Ht 5\' 5"  (1.651 m)   Wt 82.1 kg (181 lb)   SpO2 98%   BMI 30.12 kg/m   Physical Exam  Constitutional: She is oriented to person, place, and time. She appears well-developed and well-nourished. No distress.  HENT:  Head: Normocephalic and atraumatic.  Eyes: Pupils are equal, round, and reactive to light. EOM are normal.  Neck: Neck supple.  Cardiovascular: Normal rate, regular rhythm, normal heart sounds and intact distal pulses.  Pulmonary/Chest: Effort normal and breath sounds normal. She exhibits no tenderness.  Abdominal: Soft. Bowel sounds are normal. She exhibits no distension. There is no tenderness.  Musculoskeletal:  Patient has swelling of the left wrist.  There is an approximately 2 cm, oval fullness on the dorsum of the wrist over the metacarpals.  There is focal and tender.  Patient is neurovascularly intact.  Radial pulses 2+.  No deformity or swelling at the elbow or the shoulder.  However was experiencing pain with movement at the shoulder or the elbow.  No lower extremity abrasions or  deformities.  Neurological: She is alert and oriented to person, place, and time. She has normal strength. She exhibits normal muscle tone. Coordination normal. GCS eye subscore is 4. GCS verbal subscore is 5. GCS motor subscore is 6.  Skin: Skin  is warm, dry and intact.  Psychiatric: She has a normal mood and affect.     ED Treatments / Results  Labs (all labs ordered are listed, but only abnormal results are displayed) Labs Reviewed - No data to display  EKG None  Radiology Dg Elbow Complete Left  Result Date: 08/28/2017 CLINICAL DATA:  Left wrist pain EXAM: LEFT ELBOW - COMPLETE 3+ VIEW COMPARISON:  None. FINDINGS: There is no evidence of fracture, dislocation, or joint effusion. There is no evidence of arthropathy or other focal bone abnormality. Soft tissues are unremarkable. IMPRESSION: Negative. Electronically Signed   By: Kathreen Devoid   On: 08/28/2017 17:28   Dg Wrist Complete Left  Result Date: 08/28/2017 CLINICAL DATA:  Fall while rollerblading, posterior wrist pain and swelling. EXAM: LEFT WRIST - COMPLETE 3+ VIEW COMPARISON:  None. FINDINGS: Transverse distal radial metaphyseal fracture with probable but not definite distal articular surface extension in the vicinity of the lunate. There is extension of the fracture into the radial side of the distal radioulnar joint. Minimal dorsal impaction suggesting Colles fracture. Exaggerated scapholunate angle on the lateral projection is possibly incidental but could also be an indicator of a scapholunate ligament injury. I do not discern a definite distal ulnar fracture or ulnar styloid discontinuity. IMPRESSION: 1. Colle's fracture of the distal radius. Very questionable distal intra-articular extension near the lunate. 2. Accentuated scapholunate angle on the lateral projection could reflect a scapholunate ligament tear. Electronically Signed   By: Van Clines M.D.   On: 08/28/2017 16:31   Dg Shoulder Left  Result Date:  08/28/2017 CLINICAL DATA:  Left shoulder pain EXAM: LEFT SHOULDER - 2+ VIEW COMPARISON:  None. FINDINGS: There is no evidence of fracture or dislocation. There is no evidence of arthropathy or other focal bone abnormality. Soft tissues are unremarkable. IMPRESSION: Negative. Electronically Signed   By: Kathreen Devoid   On: 08/28/2017 17:27    Procedures Procedures (including critical care time)  Medications Ordered in ED Medications  acetaminophen (TYLENOL) tablet 1,000 mg (1,000 mg Oral Given 08/28/17 1653)  traMADol (ULTRAM) tablet 100 mg (100 mg Oral Given 08/28/17 1653)     Initial Impression / Assessment and Plan / ED Course  I have reviewed the triage vital signs and the nursing notes.  Pertinent labs & imaging results that were available during my care of the patient were reviewed by me and considered in my medical decision making (see chart for details).     Consult: (18: 40) reviewed with Danielle who is on-call for [place].  She contacted Dr. Tamera Punt and reviewed the x-rays and possibility of scapholunate dislocation.  Recommendation is for splinting and follow-up in the office tomorrow. Final Clinical Impressions(s) / ED Diagnoses   Final diagnoses:  Closed fracture of left wrist, initial encounter  Back strain, initial encounter  Patient has distal radius fracture on the left.  X-ray also raised the possibility of a scaphoid lunate abnormality.  Patient has a corresponding fullness that is very tender in the area raising the question of scapholunate dislocation.  This was reviewed with [facility].  Andee Poles, the physician assistant contacted Dr. Tamera Punt who reviewed x-rays and advised patient is appropriate for splinting and follow-up tomorrow in the office.  Patient initially had lower back pain although she does not have it at this time.  I suspect she will get significant discomfort of low back strain.  Patient is counseled on use of pain medications.   She reports she cannot take narcotic  pain medications for psychosocial reasons.  Plan will be for naproxen twice daily and acetaminophen every 6 hours.  Patient will add tramadol if needed for wrist pain and Norflex if needed for back spasms.  ED Discharge Orders        Ordered    acetaminophen (TYLENOL) 500 MG tablet  Every 6 hours PRN     08/28/17 1846    naproxen (NAPROSYN) 500 MG tablet  2 times daily     08/28/17 1846    traMADol (ULTRAM) 50 MG tablet     08/28/17 1846    orphenadrine (NORFLEX) 100 MG tablet  2 times daily     08/28/17 1846       Charlesetta Shanks, MD 08/28/17 1900

## 2017-08-28 NOTE — ED Triage Notes (Addendum)
Pt fell while rollerblading -injured left wrist approx 20 min PTA-pt NAD-steady gait-has ice pack in place upon arrival

## 2017-08-28 NOTE — Discharge Instructions (Addendum)
1. For pain control, take acetaminophen 1000 mg every 6 hours.  Also, you may take naproxen 500 mg twice daily.  If in addition to this you need more pain control, take 1-2 tramadol tablets every 6 hours.  Discontinue the tramadol as soon his pain is tolerable.  Elevate your wrist and apply well wrapped ice pack. 2.  Call [facility] first thing in the morning, you are to be seen for recheck tomorrow. 3.  If you experience muscle spasms in your low back from strain, you may take Norflex as a muscle relaxer.

## 2017-08-29 ENCOUNTER — Other Ambulatory Visit (HOSPITAL_COMMUNITY): Payer: Medicare HMO

## 2017-08-29 DIAGNOSIS — S52552A Other extraarticular fracture of lower end of left radius, initial encounter for closed fracture: Secondary | ICD-10-CM | POA: Diagnosis not present

## 2017-08-30 ENCOUNTER — Other Ambulatory Visit (HOSPITAL_COMMUNITY): Payer: Medicare HMO

## 2017-08-31 ENCOUNTER — Other Ambulatory Visit (HOSPITAL_COMMUNITY): Payer: Medicare HMO

## 2017-09-01 ENCOUNTER — Other Ambulatory Visit (HOSPITAL_COMMUNITY): Payer: Medicare HMO

## 2017-09-04 ENCOUNTER — Other Ambulatory Visit (HOSPITAL_COMMUNITY): Payer: Medicare HMO

## 2017-09-05 ENCOUNTER — Other Ambulatory Visit (HOSPITAL_COMMUNITY): Payer: Medicare HMO

## 2017-09-05 DIAGNOSIS — S52552A Other extraarticular fracture of lower end of left radius, initial encounter for closed fracture: Secondary | ICD-10-CM | POA: Diagnosis not present

## 2017-09-06 ENCOUNTER — Other Ambulatory Visit (HOSPITAL_COMMUNITY): Payer: Medicare HMO

## 2017-09-07 ENCOUNTER — Other Ambulatory Visit (HOSPITAL_COMMUNITY): Payer: Medicare HMO

## 2017-09-07 DIAGNOSIS — F411 Generalized anxiety disorder: Secondary | ICD-10-CM | POA: Insufficient documentation

## 2017-09-07 DIAGNOSIS — R69 Illness, unspecified: Secondary | ICD-10-CM | POA: Diagnosis not present

## 2017-09-08 ENCOUNTER — Other Ambulatory Visit (HOSPITAL_COMMUNITY): Payer: Medicare HMO

## 2017-09-11 ENCOUNTER — Other Ambulatory Visit (HOSPITAL_COMMUNITY): Payer: Medicare HMO

## 2017-09-11 DIAGNOSIS — S52552A Other extraarticular fracture of lower end of left radius, initial encounter for closed fracture: Secondary | ICD-10-CM | POA: Diagnosis not present

## 2017-09-12 ENCOUNTER — Other Ambulatory Visit (HOSPITAL_COMMUNITY): Payer: Medicare HMO

## 2017-09-13 ENCOUNTER — Other Ambulatory Visit (HOSPITAL_COMMUNITY): Payer: Medicare HMO

## 2017-09-14 ENCOUNTER — Other Ambulatory Visit (HOSPITAL_COMMUNITY): Payer: Medicare HMO

## 2017-09-15 ENCOUNTER — Other Ambulatory Visit (HOSPITAL_COMMUNITY): Payer: Medicare HMO

## 2017-09-18 ENCOUNTER — Other Ambulatory Visit (HOSPITAL_COMMUNITY): Payer: Medicare HMO

## 2017-09-19 ENCOUNTER — Other Ambulatory Visit (HOSPITAL_COMMUNITY): Payer: Medicare HMO

## 2017-09-19 DIAGNOSIS — R69 Illness, unspecified: Secondary | ICD-10-CM | POA: Diagnosis not present

## 2017-09-20 ENCOUNTER — Other Ambulatory Visit (HOSPITAL_COMMUNITY): Payer: Medicare HMO

## 2017-09-21 ENCOUNTER — Other Ambulatory Visit (HOSPITAL_COMMUNITY): Payer: Medicare HMO

## 2017-09-22 ENCOUNTER — Other Ambulatory Visit (HOSPITAL_COMMUNITY): Payer: Medicare HMO

## 2017-09-22 DIAGNOSIS — M542 Cervicalgia: Secondary | ICD-10-CM | POA: Diagnosis not present

## 2017-09-22 DIAGNOSIS — M4722 Other spondylosis with radiculopathy, cervical region: Secondary | ICD-10-CM | POA: Diagnosis not present

## 2017-09-25 ENCOUNTER — Other Ambulatory Visit (HOSPITAL_COMMUNITY): Payer: Medicare HMO

## 2017-09-26 ENCOUNTER — Other Ambulatory Visit (HOSPITAL_COMMUNITY): Payer: Medicare HMO

## 2017-09-26 ENCOUNTER — Ambulatory Visit: Payer: Medicare HMO | Admitting: Gynecology

## 2017-09-26 DIAGNOSIS — F411 Generalized anxiety disorder: Secondary | ICD-10-CM | POA: Diagnosis not present

## 2017-09-26 DIAGNOSIS — R69 Illness, unspecified: Secondary | ICD-10-CM | POA: Diagnosis not present

## 2017-09-27 ENCOUNTER — Other Ambulatory Visit (HOSPITAL_COMMUNITY): Payer: Medicare HMO

## 2017-09-28 ENCOUNTER — Other Ambulatory Visit (HOSPITAL_COMMUNITY): Payer: Medicare HMO

## 2017-09-29 ENCOUNTER — Other Ambulatory Visit (HOSPITAL_COMMUNITY): Payer: Medicare HMO

## 2017-10-02 ENCOUNTER — Other Ambulatory Visit (HOSPITAL_COMMUNITY): Payer: Medicare HMO

## 2017-10-03 ENCOUNTER — Other Ambulatory Visit (HOSPITAL_COMMUNITY): Payer: Medicare HMO

## 2017-10-03 DIAGNOSIS — S52552A Other extraarticular fracture of lower end of left radius, initial encounter for closed fracture: Secondary | ICD-10-CM | POA: Diagnosis not present

## 2017-10-04 ENCOUNTER — Other Ambulatory Visit (HOSPITAL_COMMUNITY): Payer: Medicare HMO

## 2017-10-04 DIAGNOSIS — M542 Cervicalgia: Secondary | ICD-10-CM | POA: Diagnosis not present

## 2017-10-06 ENCOUNTER — Other Ambulatory Visit (HOSPITAL_COMMUNITY): Payer: Medicare HMO

## 2017-10-12 DIAGNOSIS — M25642 Stiffness of left hand, not elsewhere classified: Secondary | ICD-10-CM | POA: Diagnosis not present

## 2017-10-12 DIAGNOSIS — M25542 Pain in joints of left hand: Secondary | ICD-10-CM | POA: Diagnosis not present

## 2017-10-12 DIAGNOSIS — M25532 Pain in left wrist: Secondary | ICD-10-CM | POA: Diagnosis not present

## 2017-10-12 DIAGNOSIS — M25632 Stiffness of left wrist, not elsewhere classified: Secondary | ICD-10-CM | POA: Diagnosis not present

## 2017-10-13 ENCOUNTER — Encounter (HOSPITAL_BASED_OUTPATIENT_CLINIC_OR_DEPARTMENT_OTHER): Payer: Self-pay | Admitting: *Deleted

## 2017-10-13 ENCOUNTER — Emergency Department (HOSPITAL_BASED_OUTPATIENT_CLINIC_OR_DEPARTMENT_OTHER): Payer: Medicare HMO

## 2017-10-13 ENCOUNTER — Emergency Department (HOSPITAL_BASED_OUTPATIENT_CLINIC_OR_DEPARTMENT_OTHER)
Admission: EM | Admit: 2017-10-13 | Discharge: 2017-10-14 | Disposition: A | Discharge status: Home / Self Care | Payer: Medicare HMO | Attending: Emergency Medicine | Admitting: Emergency Medicine

## 2017-10-13 ENCOUNTER — Other Ambulatory Visit: Payer: Self-pay

## 2017-10-13 DIAGNOSIS — Y939 Activity, unspecified: Secondary | ICD-10-CM | POA: Diagnosis not present

## 2017-10-13 DIAGNOSIS — Z79899 Other long term (current) drug therapy: Secondary | ICD-10-CM | POA: Diagnosis not present

## 2017-10-13 DIAGNOSIS — S52612D Displaced fracture of left ulna styloid process, subsequent encounter for closed fracture with routine healing: Secondary | ICD-10-CM | POA: Insufficient documentation

## 2017-10-13 DIAGNOSIS — S52612A Displaced fracture of left ulna styloid process, initial encounter for closed fracture: Secondary | ICD-10-CM

## 2017-10-13 DIAGNOSIS — Y999 Unspecified external cause status: Secondary | ICD-10-CM | POA: Insufficient documentation

## 2017-10-13 DIAGNOSIS — J449 Chronic obstructive pulmonary disease, unspecified: Secondary | ICD-10-CM | POA: Insufficient documentation

## 2017-10-13 DIAGNOSIS — X58XXXA Exposure to other specified factors, initial encounter: Secondary | ICD-10-CM | POA: Diagnosis not present

## 2017-10-13 DIAGNOSIS — Z87891 Personal history of nicotine dependence: Secondary | ICD-10-CM | POA: Diagnosis not present

## 2017-10-13 DIAGNOSIS — M542 Cervicalgia: Secondary | ICD-10-CM | POA: Diagnosis not present

## 2017-10-13 DIAGNOSIS — S6992XA Unspecified injury of left wrist, hand and finger(s), initial encounter: Secondary | ICD-10-CM | POA: Diagnosis present

## 2017-10-13 DIAGNOSIS — Y929 Unspecified place or not applicable: Secondary | ICD-10-CM | POA: Diagnosis not present

## 2017-10-13 MED ORDER — OXYCODONE-ACETAMINOPHEN 5-325 MG PO TABS: 1.0000 | ORAL_TABLET | Freq: Four times a day (QID) | ORAL | 0 refills | Status: DC | PRN

## 2017-10-13 NOTE — Discharge Instructions (Addendum)
You have a broken ulnar styloid process. Please follow up with the orthopedic doctor. I have listed the information below to [facility] (please ask for appointment with Dr. Caralyn Guile or Dr. Amedeo Plenty.)   You can take percocet for pain. This medication can make you drowsy so please do not drive or work while taking it.   Use splint for protection until you are seen by the orthopedic doctor. Elevate the wrist. Apply ice for 49min at a time twice a day.   Return to the ER for any new or worsening symptoms.

## 2017-10-13 NOTE — ED Notes (Addendum)
CMS intact before and after splint applied. RICE explained.

## 2017-10-13 NOTE — ED Triage Notes (Signed)
She had a fracture to her left wrist in May. She has been wearing a Velcro wrist splint since July 2nd. She took the splint off tonight and felt like she broke her wrist after bending her arm the wrong way. She also has pain in her neck that she has had for years.

## 2017-10-13 NOTE — ED Provider Notes (Signed)
[facility] EMERGENCY DEPARTMENT Provider Note   CSN: [id number] Arrival date & time: 10/13/17  2126     History   Chief Complaint Chief Complaint  Patient presents with  . Wrist Injury    HPI Crystal Duarte is a 57 y.o. female.  HPI   Crystal Duarte is a 57yo female with a history of osteopenia, tobacco use, depression, panic attacks who presents to the emergency department for evaluation of wrist pain.  Patient reports that she fractured her right radius 08/2017.  She was seen by orthopedics and casted.  Reports that the cast was rubbing up against the ulnar aspect of the wrist.  The cast was taken off 7/2 and she has been wearing a Velcro brace ever since.  She reports that she is unable to move the wrist since the cast was removed.  She was seen at physical therapy, but unable to participate due to pain.  She reports that today after taking the brace off to wash her face she reached quickly behind her and felt a snapping sensation over the ulnar aspect of her wrist. She has had 8/10 severity "pulling" sensation over the ulnar aspect of the wrist ever since. Pain is constant and worsened with any movement of the wrist joint. She denies numbness, weakness, break in skin, pain elsewhere.   Past Medical History:  Diagnosis Date  . Coccidioidomycosis, pulmonary ([place])   . Depression   . Hx of adenomatous polyp of colon 12/04/2014  . Osteopenia 11/2016   T score -2.2 FRAX 17%/2.4%  . Panic attack     Patient Active Problem List   Diagnosis Date Noted  . MDD (major depressive disorder), recurrent severe, without psychosis ([place]) 07/18/2017  . DOE (dyspnea on exertion) 12/26/2016  . COPD GOLD II  12/26/2016  . Orthostatic hypotension 12/14/2016  . Chest tightness 12/14/2016  . Weight loss 12/14/2016  . Routine general medical examination at a health care facility 10/09/2015  . H/O vaginal dysplasia 09/15/2015  . Hx of adenomatous polyp of colon 12/04/2014  . Memory loss  10/04/2014  . Affective bipolar disorder ([place]) 03/12/2014  . Peripheral vascular disease, unspecified ([place]) 10/10/2013  . Smoker 08/28/2012  . Menopause 08/28/2012  . Multiple pulmonary nodules 11/12/2010    Past Surgical History:  Procedure Laterality Date  . ABDOMINAL HYSTERECTOMY    . COLONOSCOPY  2001   hemorrhoidectomy  . LUNG SURGERY  2001   VATS surgery   . OVARIAN CYST REMOVAL  1970's  . VESICOVAGINAL FISTULA CLOSURE W/ TAH  2005     OB History    Gravida  2   Para  2   Term      Preterm      AB      Living  2     SAB      TAB      Ectopic      Multiple      Live Births               Home Medications    Prior to Admission medications   Medication Sig Start Date End Date Taking? Authorizing Provider  acetaminophen (TYLENOL) 500 MG tablet Take 2 tablets (1,000 mg total) by mouth every 6 (six) hours as needed. 08/28/17   Charlesetta Shanks, MD  Calcium-Vitamin D (CALTRATE 600 PLUS-VIT D PO) Take 600 mg by mouth.    [provider]  cetirizine (ZYRTEC) 10 MG tablet Take 10 mg by mouth as needed for allergies.  [provider]  Cholecalciferol (VITAMIN D3 PO) Take 1,200 mg by mouth daily.    [provider]  DULoxetine (CYMBALTA) 60 MG capsule Take 1 capsule (60 mg total) by mouth 2 (two) times daily. 07/28/17   Nanci Pina, FNP  gabapentin (NEURONTIN) 400 MG capsule Take 1 capsule (400 mg total) by mouth 3 (three) times daily. 07/28/17   Nanci Pina, FNP  Multiple Vitamin (MULTIVITAMIN) tablet Take 1 tablet by mouth daily. Reported on 10/08/2015    [provider]  naproxen (NAPROSYN) 500 MG tablet Take 1 tablet (500 mg total) by mouth 2 (two) times daily. 08/28/17   Charlesetta Shanks, MD  orphenadrine (NORFLEX) 100 MG tablet Take 1 tablet (100 mg total) by mouth 2 (two) times daily. 08/28/17   Charlesetta Shanks, MD  QUEtiapine (SEROQUEL) 300 MG tablet Take 1 tablet (300 mg total) by mouth at bedtime. 07/28/17    Nanci Pina, FNP  traMADol (ULTRAM) 50 MG tablet 2 tablets every 6 hours may be taken in combination with acetaminophen and naproxen. 08/28/17   Charlesetta Shanks, MD  traZODone (DESYREL) 100 MG tablet Take 1 tablet (100 mg total) by mouth at bedtime as needed and may repeat dose one time if needed for sleep. 07/28/17   Nanci Pina, FNP    Family History Family History  Problem Relation Age of Onset  . Asthma Mother   . Ovarian cancer Mother   . Cancer Mother        OVARIAN  . Varicose Veins Mother   . Heart disease Father   . Peripheral vascular disease Father   . Cancer Maternal Grandmother        LUNG- LUNG  . Colon cancer Neg Hx     Social History Social History   Tobacco Use  . Smoking status: Former Smoker    Packs/day: 1.00    Years: 42.00    Pack years: 42.00    Types: Cigarettes    Last attempt to quit: 09/25/2016    Years since quitting: 1.0  . Smokeless tobacco: Former Systems developer    Quit date: 06/13/2016  Substance Use Topics  . Alcohol use: No    Alcohol/week: 0.0 oz  . Drug use: No     Allergies   Latuda [lurasidone hcl] and Sulfa antibiotics   Review of Systems Review of Systems  Constitutional: Negative for chills and fever.  Musculoskeletal: Positive for arthralgias (left wrist) and joint swelling (left wrist).  Skin: Negative for color change and wound.  Neurological: Negative for weakness and numbness.     Physical Exam Updated Vital Signs BP 125/73   Pulse 82   Temp 98.4 F (36.9 C) (Oral)   Resp 18   Ht 5\' 5"  (1.651 m)   Wt 81.6 kg (180 lb)   SpO2 100%   BMI 29.95 kg/m   Physical Exam  Constitutional: She is oriented to person, place, and time. She appears well-developed and well-nourished. No distress.  No acute distress.   HENT:  Head: Normocephalic and atraumatic.  Eyes: Right eye exhibits no discharge. Left eye exhibits no discharge.  Pulmonary/Chest: Effort normal. No respiratory distress.  Musculoskeletal:  Left wrist  swollen compared to right. No erythema, warmth, ecchymosis or break in skin. Tender to palpation over the ulnar aspect of the wrist joint. Somewhat tener over the radial aspect of the wrist. Very limited wrist flexion/extension and ulnar/radial deviation due to pain. No snuff box tenderness. Grip strength 5/5. Radial pulses 2+ and  symmetric bilaterally. Cap refill <2sec. Sensation to light touch intact in radial, ulnar and median distribution of the hand.   Neurological: She is alert and oriented to person, place, and time. Coordination normal.  Skin: Skin is warm and dry. Capillary refill takes less than 2 seconds. She is not diaphoretic.  Psychiatric: She has a normal mood and affect. Her behavior is normal.  Nursing note and vitals reviewed.    ED Treatments / Results  Labs (all labs ordered are listed, but only abnormal results are displayed) Labs Reviewed - No data to display  EKG None  Radiology Dg Wrist Complete Left  Result Date: 10/13/2017 CLINICAL DATA:  Wrist fracture on 08/28/2017. Cast removed. Wrist pain. EXAM: LEFT WRIST - COMPLETE 3+ VIEW COMPARISON:  Wrist radiograph 08/28/2017 FINDINGS: Fracture of the left ulnar styloid is new from the prior examination. Posttraumatic deformity of the left radius without evidence of acute fracture. Scapholunate interval is maintained. IMPRESSION: 1. Fracture of the left ulnar styloid, new compared to 08/28/2017. 2. Posttraumatic deformity of the left radius without evidence of acute radius fracture. Electronically Signed   By: Ulyses Jarred M.D.   On: 10/13/2017 22:32    Procedures Procedures (including critical care time)  Medications Ordered in ED Medications - No data to display   Initial Impression / Assessment and Plan / ED Course  I have reviewed the triage vital signs and the nursing notes.  Pertinent labs & imaging results that were available during my care of the patient were reviewed by me and considered in my medical  decision making (see chart for details).    Left wrist x-ray reveals fracture of the left ulnar styloid which is new compared to 08/28/2017 when she broke her radius.  It also shows posttraumatic deformity of the left radius without evidence of acute fracture.  On exam left upper extremity neurovascularly intact.  No erythema, warmth, break in skin or signs of infection.  Patient placed in volar wrist splint.  Have counseled her on rice protocol and will discharge with short course of Percocet for pain management.  Have counseled her that this medication can make her drowsy and she should not drive or work while taking it.  Patient was given information to follow-up with hand surgery.  Have counseled her on reasons to return to the ER and she agrees.  Final Clinical Impressions(s) / ED Diagnoses   Final diagnoses:  Closed displaced fracture of styloid process of left ulna, initial encounter    ED Discharge Orders        Ordered    oxyCODONE-acetaminophen (PERCOCET/ROXICET) 5-325 MG tablet  Every 6 hours PRN     10/13/17 2344       Glyn Ade, PA-C 10/13/17 2358    Julianne Rice, MD 10/15/17 2311

## 2017-10-20 DIAGNOSIS — S52502A Unspecified fracture of the lower end of left radius, initial encounter for closed fracture: Secondary | ICD-10-CM | POA: Insufficient documentation

## 2017-10-20 DIAGNOSIS — G90512 Complex regional pain syndrome I of left upper limb: Secondary | ICD-10-CM | POA: Diagnosis not present

## 2017-10-20 DIAGNOSIS — S52552A Other extraarticular fracture of lower end of left radius, initial encounter for closed fracture: Secondary | ICD-10-CM | POA: Diagnosis not present

## 2017-10-20 DIAGNOSIS — S52612A Displaced fracture of left ulna styloid process, initial encounter for closed fracture: Secondary | ICD-10-CM | POA: Insufficient documentation

## 2017-10-24 ENCOUNTER — Encounter: Payer: Medicare HMO | Admitting: Gynecology

## 2017-10-29 ENCOUNTER — Other Ambulatory Visit: Payer: Self-pay

## 2017-10-29 ENCOUNTER — Emergency Department (HOSPITAL_BASED_OUTPATIENT_CLINIC_OR_DEPARTMENT_OTHER): Payer: Medicare HMO

## 2017-10-29 ENCOUNTER — Encounter (HOSPITAL_BASED_OUTPATIENT_CLINIC_OR_DEPARTMENT_OTHER): Payer: Self-pay | Admitting: *Deleted

## 2017-10-29 ENCOUNTER — Emergency Department (HOSPITAL_BASED_OUTPATIENT_CLINIC_OR_DEPARTMENT_OTHER)
Admission: EM | Admit: 2017-10-29 | Discharge: 2017-10-29 | Disposition: A | Discharge status: Home / Self Care | Payer: Medicare HMO | Attending: Emergency Medicine | Admitting: Emergency Medicine

## 2017-10-29 DIAGNOSIS — R05 Cough: Secondary | ICD-10-CM | POA: Diagnosis not present

## 2017-10-29 DIAGNOSIS — J069 Acute upper respiratory infection, unspecified: Secondary | ICD-10-CM

## 2017-10-29 DIAGNOSIS — B9789 Other viral agents as the cause of diseases classified elsewhere: Secondary | ICD-10-CM | POA: Diagnosis not present

## 2017-10-29 DIAGNOSIS — Z79899 Other long term (current) drug therapy: Secondary | ICD-10-CM | POA: Diagnosis not present

## 2017-10-29 DIAGNOSIS — F17210 Nicotine dependence, cigarettes, uncomplicated: Secondary | ICD-10-CM | POA: Insufficient documentation

## 2017-10-29 DIAGNOSIS — R69 Illness, unspecified: Secondary | ICD-10-CM | POA: Diagnosis not present

## 2017-10-29 DIAGNOSIS — J449 Chronic obstructive pulmonary disease, unspecified: Secondary | ICD-10-CM | POA: Insufficient documentation

## 2017-10-29 DIAGNOSIS — R0602 Shortness of breath: Secondary | ICD-10-CM | POA: Diagnosis not present

## 2017-10-29 MED ORDER — ALBUTEROL SULFATE HFA 108 (90 BASE) MCG/ACT IN AERS
2.0000 | INHALATION_SPRAY | Freq: Once | RESPIRATORY_TRACT | Status: AC
  Administered 2017-10-29: 2 via RESPIRATORY_TRACT
  Filled 2017-10-29: qty 6.7

## 2017-10-29 MED ORDER — IPRATROPIUM-ALBUTEROL 0.5-2.5 (3) MG/3ML IN SOLN
3.0000 mL | Freq: Four times a day (QID) | RESPIRATORY_TRACT | Status: DC
  Administered 2017-10-29: 3 mL via RESPIRATORY_TRACT
  Filled 2017-10-29: qty 3

## 2017-10-29 MED ORDER — ALBUTEROL SULFATE HFA 108 (90 BASE) MCG/ACT IN AERS: 2.0000 | INHALATION_SPRAY | RESPIRATORY_TRACT | 0 refills | Status: DC | PRN

## 2017-10-29 MED ORDER — DEXAMETHASONE SODIUM PHOSPHATE 10 MG/ML IJ SOLN
10.0000 mg | Freq: Once | INTRAMUSCULAR | Status: AC
  Administered 2017-10-29: 10 mg via INTRAMUSCULAR
  Filled 2017-10-29: qty 1

## 2017-10-29 MED ORDER — FLUTICASONE PROPIONATE 50 MCG/ACT NA SUSP: 2.0000 | Freq: Every day | NASAL | 2 refills | Status: DC

## 2017-10-29 NOTE — ED Triage Notes (Signed)
Pt reports cough and SOB x 1week

## 2017-10-29 NOTE — Discharge Instructions (Addendum)
You were seen today for cough and upper respiratory symptoms.  This is likely related to a virus.  Use your inhaler every 4 hours as needed.  Use Flonase for nasal drip.  Use ibuprofen or naproxen as needed for body aches and pains.  Your chest x-ray is negative for pneumonia.  Viruses typically last 5 to 7 days.

## 2017-10-29 NOTE — ED Provider Notes (Signed)
[facility] EMERGENCY DEPARTMENT Provider Note   CSN: [id number] Arrival date & time: 10/29/17  0034     History   Chief Complaint Chief Complaint  Patient presents with  . Cough    HPI Crystal Duarte is a 57 y.o. female.  HPI  This is a 58 year old female with a history of COPD, bipolar disorder who presents with 3-day history of upper respiratory congestion and cough.  She states that cough is keeping her up at night.  She feels short of breath.  Denies any significant chest pain.  Patient reports rhinorrhea and congestion.  She is taking Mucinex DM with minimal relief.  Reports that her mother was just diagnosed with pneumonia.  She reports chills without documented fevers.  She continues to smoke.  She denies any nausea, vomiting, abdominal pain, chest pain.  She does feel like her lymph nodes are swollen over her right chest.  Past Medical History:  Diagnosis Date  . Coccidioidomycosis, pulmonary ([place])   . Depression   . Hx of adenomatous polyp of colon 12/04/2014  . Osteopenia 11/2016   T score -2.2 FRAX 17%/2.4%  . Panic attack     Patient Active Problem List   Diagnosis Date Noted  . MDD (major depressive disorder), recurrent severe, without psychosis ([place]) 07/18/2017  . DOE (dyspnea on exertion) 12/26/2016  . COPD GOLD II  12/26/2016  . Orthostatic hypotension 12/14/2016  . Chest tightness 12/14/2016  . Weight loss 12/14/2016  . Routine general medical examination at a health care facility 10/09/2015  . H/O vaginal dysplasia 09/15/2015  . Hx of adenomatous polyp of colon 12/04/2014  . Memory loss 10/04/2014  . Affective bipolar disorder ([place]) 03/12/2014  . Peripheral vascular disease, unspecified ([place]) 10/10/2013  . Smoker 08/28/2012  . Menopause 08/28/2012  . Multiple pulmonary nodules 11/12/2010    Past Surgical History:  Procedure Laterality Date  . ABDOMINAL HYSTERECTOMY    . COLONOSCOPY  2001   hemorrhoidectomy  . LUNG SURGERY  2001   VATS surgery   . OVARIAN CYST REMOVAL  1970's  . VESICOVAGINAL FISTULA CLOSURE W/ TAH  2005     OB History    Gravida  2   Para  2   Term      Preterm      AB      Living  2     SAB      TAB      Ectopic      Multiple      Live Births               Home Medications    Prior to Admission medications   Medication Sig Start Date End Date Taking? Authorizing Provider  gabapentin (NEURONTIN) 400 MG capsule Take 1 capsule (400 mg total) by mouth 3 (three) times daily. 07/28/17  Yes Starkes, Gayland Curry, FNP  QUEtiapine (SEROQUEL) 300 MG tablet Take 1 tablet (300 mg total) by mouth at bedtime. 07/28/17  Yes Starkes, Gayland Curry, FNP  acetaminophen (TYLENOL) 500 MG tablet Take 2 tablets (1,000 mg total) by mouth every 6 (six) hours as needed. 08/28/17   Charlesetta Shanks, MD  albuterol (PROVENTIL HFA;VENTOLIN HFA) 108 (90 Base) MCG/ACT inhaler Inhale 2 puffs into the lungs every 4 (four) hours as needed for wheezing or shortness of breath. 10/29/17   Horton, Barbette Hair, MD  Calcium-Vitamin D (CALTRATE 600 PLUS-VIT D PO) Take 600 mg by mouth.    [provider]  cetirizine (ZYRTEC) 10  MG tablet Take 10 mg by mouth as needed for allergies.    [provider]  Cholecalciferol (VITAMIN D3 PO) Take 1,200 mg by mouth daily.    [provider]  DULoxetine (CYMBALTA) 60 MG capsule Take 1 capsule (60 mg total) by mouth 2 (two) times daily. 07/28/17   Nanci Pina, FNP  fluticasone (FLONASE) 50 MCG/ACT nasal spray Place 2 sprays into both nostrils daily. 10/29/17   Horton, Barbette Hair, MD  Multiple Vitamin (MULTIVITAMIN) tablet Take 1 tablet by mouth daily. Reported on 10/08/2015    [provider]  naproxen (NAPROSYN) 500 MG tablet Take 1 tablet (500 mg total) by mouth 2 (two) times daily. 08/28/17   Charlesetta Shanks, MD  orphenadrine (NORFLEX) 100 MG tablet Take 1 tablet (100 mg total) by mouth 2 (two) times daily. 08/28/17   Charlesetta Shanks, MD    oxyCODONE-acetaminophen (PERCOCET/ROXICET) 5-325 MG tablet Take 1 tablet by mouth every 6 (six) hours as needed for severe pain. 10/13/17   Glyn Ade, PA-C  traMADol (ULTRAM) 50 MG tablet 2 tablets every 6 hours may be taken in combination with acetaminophen and naproxen. 08/28/17   Charlesetta Shanks, MD  traZODone (DESYREL) 100 MG tablet Take 1 tablet (100 mg total) by mouth at bedtime as needed and may repeat dose one time if needed for sleep. 07/28/17   Nanci Pina, FNP    Family History Family History  Problem Relation Age of Onset  . Asthma Mother   . Ovarian cancer Mother   . Cancer Mother        OVARIAN  . Varicose Veins Mother   . Heart disease Father   . Peripheral vascular disease Father   . Cancer Maternal Grandmother        LUNG- LUNG  . Colon cancer Neg Hx     Social History Social History   Tobacco Use  . Smoking status: Current Some Day Smoker    Packs/day: 1.00    Years: 42.00    Pack years: 42.00    Types: Cigarettes    Last attempt to quit: 09/25/2016    Years since quitting: 1.0  . Smokeless tobacco: Former Systems developer    Quit date: 06/13/2016  Substance Use Topics  . Alcohol use: No    Alcohol/week: 0.0 oz  . Drug use: No     Allergies   Latuda [lurasidone hcl] and Sulfa antibiotics   Review of Systems Review of Systems  Constitutional: Positive for chills. Negative for fever.  HENT: Positive for postnasal drip, rhinorrhea and sore throat. Negative for ear pain.   Respiratory: Positive for cough and shortness of breath.   Cardiovascular: Negative for chest pain.  Gastrointestinal: Negative for abdominal pain, nausea and vomiting.  All other systems reviewed and are negative.    Physical Exam Updated Vital Signs BP (!) 159/75 (BP Location: Left Arm)   Pulse 91   Temp 99.6 F (37.6 C) (Oral)   Resp (!) 21   SpO2 96%   Physical Exam  Constitutional: She is oriented to person, place, and time. She appears well-developed and  well-nourished.  HENT:  Head: Normocephalic and atraumatic.  Postnasal drip noted, bilateral tonsils minimally enlarged but symmetric, uvula midline, no significant erythema  Eyes: Pupils are equal, round, and reactive to light.  Neck: Neck supple.  Cardiovascular: Normal rate, regular rhythm and normal heart sounds.  Pulmonary/Chest: Effort normal. No respiratory distress. She has wheezes. She has no rales.  Occasional cough, expiratory wheeze noted  Abdominal: Soft. Bowel sounds are normal. There is no tenderness.  Musculoskeletal: She exhibits no edema.  Lymphadenopathy:    She has cervical adenopathy.  Neurological: She is alert and oriented to person, place, and time.  Skin: Skin is warm and dry.  Psychiatric: She has a normal mood and affect.  Nursing note and vitals reviewed.    ED Treatments / Results  Labs (all labs ordered are listed, but only abnormal results are displayed) Labs Reviewed - No data to display  EKG EKG Interpretation  Date/Time:  Sunday October 29 2017 01:34:52 EDT Ventricular Rate:  86 PR Interval:    QRS Duration: 88 QT Interval:  391 QTC Calculation: 468 R Axis:   78 Text Interpretation:  Sinus rhythm Confirmed by Horton, Courtney ([id number]) on 10/29/2017 1:58:16 AM   Radiology Dg Chest 2 View  Result Date: 10/29/2017 CLINICAL DATA:  Cough EXAM: CHEST - 2 VIEW COMPARISON:  12/26/2016 FINDINGS: The heart size and mediastinal contours are within normal limits. Both lungs are clear. Postsurgical changes at the right lung apex. IMPRESSION: No active cardiopulmonary disease. Electronically Signed   By: Kim  Fujinaga M.D.   On: 10/29/2017 01:53    Procedures Procedures (including critical care time)  Medications Ordered in ED Medications  ipratropium-albuterol (DUONEB) 0.5-2.5 (3) MG/3ML nebulizer solution 3 mL (3 mLs Nebulization Given 10/29/17 0151)  albuterol (PROVENTIL HFA;VENTOLIN HFA) 108 (90 Base) MCG/ACT inhaler 2 puff (2 puffs Inhalation Given  10/29/17 0151)  dexamethasone (DECADRON) injection 10 mg (10 mg Intramuscular Given 10/29/17 0147)     Initial Impression / Assessment and Plan / ED Course  I have reviewed the triage vital signs and the nursing notes.  Pertinent labs & imaging results that were available during my care of the patient were reviewed by me and considered in my medical decision making (see chart for details).     Patient presents with upper respiratory symptoms.  She is overall nontoxic-appearing on exam and vital signs are reassuring.  She is afebrile.  Temperature 99.6.  Heart rates 93 with a pulse ox of 96%.  She does have wheezing on exam.  She is in no respiratory distress.  She also has significant postnasal drip.  Chest x-ray obtained to rule out pneumonia.  No evidence of pneumonia or pneumothorax.  Patient was given a DuoNeb and Decadron given her history of COPD and smoking.  She reports significant improvement after DuoNeb.  She was also given an inhaler.   EKG is largely reassuring.  At this time, feel this is likely viral in nature.  Recommend supportive care.  Given postnasal drip, recommend Flonase and nasal saline.  Will discharge home with an inhaler.  Naproxen as needed for pain.  No indication at this time for antibiotics.  After history, exam, and medical workup I feel the patient has been appropriately medically screened and is safe for discharge home. Pertinent diagnoses were discussed with the patient. Patient was given return precautions.   Final Clinical Impressions(s) / ED Diagnoses   Final diagnoses:  Viral URI with cough    ED Discharge Orders        Ordered    fluticasone (FLONASE) 50 MCG/ACT nasal spray  Daily     10/29/17 0227    albuterol (PROVENTIL HFA;VENTOLIN HFA) 108 (90 Base) MCG/ACT inhaler  Every 4 hours PRN     07 /28/19 0227       Merryl Hacker, MD 10/29/17 [phone]

## 2017-11-01 ENCOUNTER — Encounter: Payer: Medicare HMO | Admitting: Gynecology

## 2017-11-01 DIAGNOSIS — Z0289 Encounter for other administrative examinations: Secondary | ICD-10-CM

## 2017-11-02 ENCOUNTER — Emergency Department (HOSPITAL_COMMUNITY): Payer: Medicare HMO

## 2017-11-02 ENCOUNTER — Emergency Department (HOSPITAL_COMMUNITY)
Admission: EM | Admit: 2017-11-02 | Discharge: 2017-11-03 | Disposition: A | Discharge status: Other Acute Inpatient Hospital | Payer: Medicare HMO | Attending: Emergency Medicine | Admitting: Emergency Medicine

## 2017-11-02 ENCOUNTER — Other Ambulatory Visit: Payer: Self-pay

## 2017-11-02 ENCOUNTER — Encounter (HOSPITAL_COMMUNITY): Payer: Self-pay

## 2017-11-02 DIAGNOSIS — F332 Major depressive disorder, recurrent severe without psychotic features: Secondary | ICD-10-CM | POA: Insufficient documentation

## 2017-11-02 DIAGNOSIS — F17210 Nicotine dependence, cigarettes, uncomplicated: Secondary | ICD-10-CM | POA: Insufficient documentation

## 2017-11-02 DIAGNOSIS — F329 Major depressive disorder, single episode, unspecified: Secondary | ICD-10-CM | POA: Diagnosis present

## 2017-11-02 DIAGNOSIS — Z046 Encounter for general psychiatric examination, requested by authority: Secondary | ICD-10-CM | POA: Insufficient documentation

## 2017-11-02 DIAGNOSIS — R000 Tachycardia, unspecified: Secondary | ICD-10-CM | POA: Diagnosis not present

## 2017-11-02 DIAGNOSIS — R45851 Suicidal ideations: Secondary | ICD-10-CM | POA: Insufficient documentation

## 2017-11-02 DIAGNOSIS — J449 Chronic obstructive pulmonary disease, unspecified: Secondary | ICD-10-CM | POA: Diagnosis not present

## 2017-11-02 DIAGNOSIS — Z79899 Other long term (current) drug therapy: Secondary | ICD-10-CM | POA: Insufficient documentation

## 2017-11-02 DIAGNOSIS — R0602 Shortness of breath: Secondary | ICD-10-CM | POA: Diagnosis not present

## 2017-11-02 DIAGNOSIS — J019 Acute sinusitis, unspecified: Secondary | ICD-10-CM

## 2017-11-02 DIAGNOSIS — R05 Cough: Secondary | ICD-10-CM | POA: Diagnosis not present

## 2017-11-02 DIAGNOSIS — F419 Anxiety disorder, unspecified: Secondary | ICD-10-CM | POA: Diagnosis not present

## 2017-11-02 DIAGNOSIS — R69 Illness, unspecified: Secondary | ICD-10-CM | POA: Diagnosis not present

## 2017-11-02 LAB — ETHANOL: Alcohol, Ethyl (B): <10 mg/dL (ref <10)

## 2017-11-02 LAB — COMPREHENSIVE METABOLIC PANEL
ALT: 16 U/L (ref 0–44)
AST: 19 U/L (ref 15–41)
Albumin: 4.1 g/dL (ref 3.5–5.0)
Alkaline Phosphatase: 97 U/L (ref 38–126)
Anion gap: 11 (ref 5–15)
BUN: 16 mg/dL (ref 6–20)
CO2: 24 mmol/L (ref 22–32)
Calcium: 9.4 mg/dL (ref 8.9–10.3)
Chloride: 104 mmol/L (ref 98–111)
Creatinine, Ser: 0.94 mg/dL (ref 0.44–1.00)
GFR calc Af Amer: >60 mL/min (ref >60)
GFR calc non Af Amer: >60 mL/min (ref >60)
Glucose, Bld: 147 mg/dL — ABNORMAL HIGH (ref 70–99)
Potassium: 3.6 mmol/L (ref 3.5–5.1)
Sodium: 139 mmol/L (ref 135–145)
Total Bilirubin: 0.8 mg/dL (ref 0.3–1.2)
Total Protein: 7.8 g/dL (ref 6.5–8.1)

## 2017-11-02 LAB — CBC
HCT: 40.6 % (ref 36.0–46.0)
Hemoglobin: 13.4 g/dL (ref 12.0–15.0)
MCH: 31.1 pg (ref 26.0–34.0)
MCHC: 33 g/dL (ref 30.0–36.0)
MCV: 94.2 fL (ref 78.0–100.0)
Platelets: 366 10*3/uL (ref 150–400)
RBC: 4.31 MIL/uL (ref 3.87–5.11)
RDW: 13 % (ref 11.5–15.5)
WBC: 15.5 10*3/uL — ABNORMAL HIGH (ref 4.0–10.5)

## 2017-11-02 LAB — RAPID URINE DRUG SCREEN, HOSP PERFORMED
Amphetamines: NOT DETECTED
Barbiturates: NOT DETECTED
Benzodiazepines: NOT DETECTED
Cocaine: NOT DETECTED
Opiates: NOT DETECTED
Tetrahydrocannabinol: NOT DETECTED

## 2017-11-02 LAB — SALICYLATE LEVEL: Salicylate Lvl: <7 mg/dL (ref 2.8–30.0)

## 2017-11-02 LAB — I-STAT BETA HCG BLOOD, ED (MC, WL, AP ONLY): I-stat hCG, quantitative: <5 m[IU]/mL (ref <5)

## 2017-11-02 LAB — ACETAMINOPHEN LEVEL: Acetaminophen (Tylenol), Serum: <10 ug/mL — ABNORMAL LOW (ref 10–30)

## 2017-11-02 MED ORDER — ACETAMINOPHEN 325 MG PO TABS
650.0000 mg | ORAL_TABLET | Freq: Once | ORAL | Status: AC
  Administered 2017-11-02: 650 mg via ORAL
  Filled 2017-11-02: qty 2

## 2017-11-02 MED ORDER — TRAZODONE HCL 100 MG PO TABS
100.0000 mg | ORAL_TABLET | Freq: Every evening | ORAL | Status: DC | PRN
  Administered 2017-11-02: 100 mg via ORAL
  Filled 2017-11-02: qty 1

## 2017-11-02 MED ORDER — AZITHROMYCIN 250 MG PO TABS
500.0000 mg | ORAL_TABLET | Freq: Two times a day (BID) | ORAL | Status: DC
  Administered 2017-11-02: 500 mg via ORAL
  Filled 2017-11-02: qty 2

## 2017-11-02 MED ORDER — SODIUM CHLORIDE 0.9 % IV BOLUS
1000.0000 mL | Freq: Once | INTRAVENOUS | Status: AC
  Administered 2017-11-02: 1000 mL via INTRAVENOUS

## 2017-11-02 MED ORDER — DM-GUAIFENESIN ER 30-600 MG PO TB12
1.0000 | ORAL_TABLET | Freq: Two times a day (BID) | ORAL | Status: DC
  Administered 2017-11-02 – 2017-11-03 (×2): 1 via ORAL
  Filled 2017-11-02 (×2): qty 1

## 2017-11-02 MED ORDER — CITALOPRAM HYDROBROMIDE 10 MG PO TABS
40.0000 mg | ORAL_TABLET | Freq: Every day | ORAL | Status: DC
  Administered 2017-11-03: 40 mg via ORAL
  Filled 2017-11-02: qty 4

## 2017-11-02 MED ORDER — IPRATROPIUM-ALBUTEROL 0.5-2.5 (3) MG/3ML IN SOLN
3.0000 mL | Freq: Once | RESPIRATORY_TRACT | Status: AC
  Administered 2017-11-02: 3 mL via RESPIRATORY_TRACT
  Filled 2017-11-02: qty 3

## 2017-11-02 MED ORDER — AMOXICILLIN-POT CLAVULANATE 875-125 MG PO TABS
1.0000 | ORAL_TABLET | Freq: Two times a day (BID) | ORAL | Status: DC
  Administered 2017-11-03: 1 via ORAL
  Filled 2017-11-02: qty 1

## 2017-11-02 MED ORDER — BENZONATATE 100 MG PO CAPS
100.0000 mg | ORAL_CAPSULE | Freq: Three times a day (TID) | ORAL | Status: DC
  Administered 2017-11-02 – 2017-11-03 (×4): 100 mg via ORAL
  Filled 2017-11-02 (×4): qty 1

## 2017-11-02 NOTE — ED Triage Notes (Signed)
Pt has hx of depression and anxiety. Pt states that she has been SI for about the past month. Pt states she has been battling since she was 14. Pt states she took 15 aleve PM , 10 trazadone, and 10 busiperone on Saturday. Pt states she just "wanted to go to sleep". Pt concerned about family.

## 2017-11-02 NOTE — BH Assessment (Signed)
Assessment Note  Crystal Duarte is a divorced 57 y.o. female who presents voluntarily to [facility] reporting symptoms of depression with suicidal ideation. Pt has a history of Major Depressive Disorder & anxiety dx since age 34.  Pt was brought to ED today by her dtr after pt disclosed plan to OD on rx medications. Pt reports she hasn't eaten, taken fluids or any medication x 4 days. And that she took an OD of meds 2 days ago. Pt states she has been putting her affairs (especially financial) in order for weeks, anticipating her suicide.  Pt is very tearful & currently regrets plan to end her life was thwarted. Pt reports 4-5 past suicide attempts. Pt denies all HI/ history of violence. She denies AVH. Pt states current stressors include being on disability has taken purpose from her life, financial stress- includes years of alimony payments are to end soon,her failing physical health, & problems with primary support.   Pt lives with her mother and states she cannot return there. Pt has fair insight and poor judgment. Pt's memory is good. IP history includes recent admissions at [facility] and [facility]. Pt denies current alcohol/ substance abuse. ? MSE: Pt is dressed in scrubs, alert, oriented x4 with soft speech and normal motor behavior. Eye contact is good. Pt's mood is depressed and affect is depressed and anxious. Affect is congruent with mood. Thought process is coherent and relevant. Pt was cooperative throughout assessment.   Disposition: Jinny Blossom, NP recommends overnight observations for safety & stabilization. Pt to be seen by psychiatry 11/03/2017.   Diagnosis: F33.2 Major depressive disorder, Recurrent episode, Severe   Past Medical History:  Past Medical History:  Diagnosis Date  . Coccidioidomycosis, pulmonary ([place])   . Depression   . Hx of adenomatous polyp of colon 12/04/2014  . Osteopenia 11/2016   T score -2.2 FRAX 17%/2.4%  . Panic attack     Past Surgical History:  Procedure  Laterality Date  . ABDOMINAL HYSTERECTOMY    . COLONOSCOPY  2001   hemorrhoidectomy  . LUNG SURGERY  2001   VATS surgery   . OVARIAN CYST REMOVAL  1970's  . VESICOVAGINAL FISTULA CLOSURE W/ TAH  2005    Family History:  Family History  Problem Relation Age of Onset  . Asthma Mother   . Ovarian cancer Mother   . Cancer Mother        OVARIAN  . Varicose Veins Mother   . Heart disease Father   . Peripheral vascular disease Father   . Cancer Maternal Grandmother        LUNG- LUNG  . Colon cancer Neg Hx     Social History:  reports that she has been smoking cigarettes.  She has a 42.00 pack-year smoking history. She quit smokeless tobacco use about 16 months ago. She reports that she does not drink alcohol or use drugs.  Additional Social History:  Alcohol / Drug Use Pain Medications: See MAR Prescriptions: See MAr Over the Counter: See MAR History of alcohol / drug use?: No history of alcohol / drug abuse Longest period of sobriety (when/how long): NA  CIWA: CIWA-Ar BP: 129/64 Pulse Rate: 80 COWS:    Allergies:  Allergies  Allergen Reactions  . Latuda [Lurasidone Hcl] Anxiety  . Sulfa Antibiotics Rash    Only in sunlight     Home Medications:  (Not in a hospital admission)  OB/GYN Status:  No LMP recorded. Patient has had a hysterectomy.  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 Living Arrangements: Parent Can pt return to current living arrangement?: No Is patient capable of signing voluntary admission?: Yes Referral Source: Self/Family/Friend Insurance type: English Living Arrangements: Parent Name of Psychiatrist: Catering manager at [facility] Name of Therapist: [facility]  Education Status Is  patient currently in school?: No Is the patient employed, unemployed or receiving disability?: Receiving disability income  Risk to self with the past 6 months Suicidal Ideation: Yes-Currently Present Has patient been a risk to self within the past 6 months prior to admission? : Yes Suicidal Intent: Yes-Currently Present Has patient had any suicidal intent within the past 6 months prior to admission? : Yes Is patient at risk for suicide?: Yes Suicidal Plan?: Yes-Currently Present Has patient had any suicidal plan within the past 6 months prior to admission? : Yes Specify Current Suicidal Plan: OD on meds or OTC pills Access to Means: Yes What has been your use of drugs/alcohol within the last 12 months?: n/a Previous Attempts/Gestures: Yes How many times?: 5 Other Self Harm Risks: none Triggers for Past Attempts: Unknown, Unpredictable, Family contact Intentional Self Injurious Behavior: None Family Suicide History: No Recent stressful life event(s): Loss (Comment), Financial Problems Persecutory voices/beliefs?: No Depression: Yes Depression Symptoms: Despondent, Insomnia, Tearfulness, Isolating, Fatigue, Guilt, Loss of interest in usual pleasures, Feeling worthless/self pity, Feeling angry/irritable Substance abuse history and/or treatment for substance abuse?: No Suicide prevention information given to non-admitted patients: Not applicable  Risk to Others within the past 6 months Homicidal Ideation: No Does patient have any lifetime risk of violence toward others beyond the six months prior to admission? : No Thoughts of Harm to Others: No Current Homicidal Intent: No Current Homicidal Plan: No Access to Homicidal Means: No History of harm to others?: No Assessment of Violence: None Noted Does patient have access to weapons?: No Criminal Charges Pending?: No Does patient have a court date: No Is patient on probation?: No  Psychosis Hallucinations: None noted Delusions:  None noted  Mental Status Report Appearance/Hygiene: Disheveled, In scrubs Eye Contact: Good Motor Activity: Freedom of movement, Unsteady Speech: Soft, Logical/coherent Level of Consciousness: Alert, Crying Mood: Depressed, Sad, Worthless, low self-esteem, Pleasant Affect: Depressed, Flat Anxiety Level: Moderate Thought Processes: Coherent, Relevant Judgement: Impaired Orientation: Person, Place, Time, Situation Obsessive Compulsive Thoughts/Behaviors: None  Cognitive Functioning Concentration: Decreased Memory: Recent Intact, Remote Intact Is patient IDD: No Is patient DD?: No Insight: Fair Impulse Control: Poor Appetite: Poor Have you had any weight changes? : (UTA) Sleep: Increased Total Hours of Sleep: (UTA) Vegetative Symptoms: Staying in bed, Decreased grooming  ADLScreening [facility] Assessment Services) Patient's cognitive ability adequate to safely complete daily activities?: Yes Patient able to express need for assistance with ADLs?: Yes Independently performs ADLs?: Yes (appropriate for developmental age)  Prior Inpatient Therapy Prior Inpatient Therapy: Yes Prior Therapy Dates: 2019 Prior Therapy [place]ilty/Provider(s[place]; [facility] Reason for Treatment: Depression  Prior Outpatient Therapy Prior Outpatient Therapy: Yes Prior Therapy Dates: ongoing Prior Therapy Facilty/Provider(s): Doroteo Glassman Reason for Treatment: Depression Does patient have an ACCT team?: No Does patient have Intensive In-House Services?  : No Does patient have [facility] services? : No Does patient have P4CC services?: No  ADL Screening (condition at time of admission) Patient's cognitive ability adequate to safely complete  daily activities?: Yes Is the patient deaf or have difficulty hearing?: No Does the patient have difficulty seeing, even when wearing glasses/contacts?: No Does the patient have difficulty concentrating, remembering, or making decisions?: No Patient able to  express need for assistance with ADLs?: Yes Does the patient have difficulty dressing or bathing?: No Independently performs ADLs?: Yes (appropriate for developmental age) Does the patient have difficulty walking or climbing stairs?: No Weakness of Legs: None Weakness of Arms/Hands: None  Home Assistive Devices/Equipment Home Assistive Devices/Equipment: None  Therapy Consults (therapy consults require a physician order) PT Evaluation Needed: No OT Evalulation Needed: No SLP Evaluation Needed: No Abuse/Neglect Assessment (Assessment to be complete while patient is alone) Abuse/Neglect Assessment Can Be Completed: Yes Physical Abuse: Denies Verbal Abuse: Yes, past (Comment) Sexual Abuse: Denies Exploitation of patient/patient's resources: Denies Self-Neglect: Yes, present (Comment) Values / Beliefs Cultural Requests During Hospitalization: None Spiritual Requests During Hospitalization: None Consults Spiritual Care Consult Needed: No Social Work Consult Needed: No Regulatory affairs officer (For Healthcare) Does Patient Have a Medical Advance Directive?: No Would patient like information on creating a medical advance directive?: No - Patient declined          Disposition:  Disposition Initial Assessment Completed for this Encounter: Yes  On Site Evaluation by:   Reviewed with Physician:    Richardean Chimera 11/02/2017 7:02 PM

## 2017-11-02 NOTE — ED Notes (Signed)
Bed: RP59 Expected date:  Expected time:  Means of arrival:  Comments: Tr4

## 2017-11-02 NOTE — ED Notes (Signed)
Pt refuses to be stuck states "shell wait for Ultra sound because you cant get it"

## 2017-11-02 NOTE — ED Notes (Signed)
Unsuccessful IV attempt x1. 

## 2017-11-02 NOTE — ED Notes (Signed)
Pt presents with Depression and Anxiety, SI increasing in severity x 1 mos.  Pt ingested 15 Aleve, 10 Trazodone, and 10 Buspirone this past Saturday as suicide attempt, stating she just wanted to go to sleep.  Splint to Left wrist/Broken L Wrist.  A&O x 3, no distress noted, Denies HI or AVH. Calm & cooperative.  Monitoring for safety, Q 15 min checks in effect.

## 2017-11-02 NOTE — ED Provider Notes (Addendum)
[facility] DEPT Provider Note  CSN: [id number] Arrival date & time: 11/02/17  1416  History   Chief Complaint Chief Complaint  Patient presents with  . Suicidal    HPI Crystal Duarte is a 57 y.o. female with a psychiatric history of MDD and medical history of COPD and PVD who presented to the ED for suicidal ideation. Patient's worsening depression and SI over the last week occur in the context of worries over her mother's health and her own medical concerns.Patient reports 40+ year history of depression and is "tired of going through the same shit." She states that today that she was going to the store to buy medications to overdose on, but her daughter brought her to the ED when she told her about her suicide plans. Patient denies taking any medications as an overdose today, but admits to taking 15 Aleve PM, 10 Trazodone and 10 Buspar on Saturday 10/29/17. Patient has established care with a psychiatrist and therapist.  Patient has upper respiratory complaints of cough, SOB, facial pain and congestion which have been present for 1 week. Denies chest pain, palpitations, hemoptysis and leg swelling. She was seen in the ED on 10/29/17 for the same complaints and diagnosed with URI. At that time, she was given IM steroid and Albuterol treatment. Patient has not been taking any medications or tried new interventions since then.   Past Medical History:  Diagnosis Date  . Coccidioidomycosis, pulmonary ([place])   . Depression   . Hx of adenomatous polyp of colon 12/04/2014  . Osteopenia 11/2016   T score -2.2 FRAX 17%/2.4%  . Panic attack     Patient Active Problem List   Diagnosis Date Noted  . MDD (major depressive disorder), recurrent severe, without psychosis ([place]) 07/18/2017  . DOE (dyspnea on exertion) 12/26/2016  . COPD GOLD II  12/26/2016  . Orthostatic hypotension 12/14/2016  . Chest tightness 12/14/2016  . Weight loss 12/14/2016  . Routine general medical  examination at a health care facility 10/09/2015  . H/O vaginal dysplasia 09/15/2015  . Hx of adenomatous polyp of colon 12/04/2014  . Memory loss 10/04/2014  . Affective bipolar disorder ([place]) 03/12/2014  . Peripheral vascular disease, unspecified ([place]) 10/10/2013  . Smoker 08/28/2012  . Menopause 08/28/2012  . Multiple pulmonary nodules 11/12/2010    Past Surgical History:  Procedure Laterality Date  . ABDOMINAL HYSTERECTOMY    . COLONOSCOPY  2001   hemorrhoidectomy  . LUNG SURGERY  2001   VATS surgery   . OVARIAN CYST REMOVAL  1970's  . VESICOVAGINAL FISTULA CLOSURE W/ TAH  2005     OB History    Gravida  2   Para  2   Term      Preterm      AB      Living  2     SAB      TAB      Ectopic      Multiple      Live Births               Home Medications    Prior to Admission medications   Medication Sig Start Date End Date Taking? Authorizing Provider  acetaminophen (TYLENOL) 500 MG tablet Take 2 tablets (1,000 mg total) by mouth every 6 (six) hours as needed. 08/28/17  Yes Charlesetta Shanks, MD  albuterol (PROVENTIL HFA;VENTOLIN HFA) 108 (90 Base) MCG/ACT inhaler Inhale 2 puffs into the lungs every 4 (four) hours as needed for wheezing  or shortness of breath. 10/29/17  Yes Horton, Barbette Hair, MD  busPIRone (BUSPAR) 15 MG tablet Take 15 mg by mouth 3 (three) times daily. 09/18/17  Yes [provider]  DULoxetine (CYMBALTA) 60 MG capsule Take 1 capsule (60 mg total) by mouth 2 (two) times daily. 07/28/17  Yes Starkes, Gayland Curry, FNP  Naproxen Sod-diphenhydrAMINE (ALEVE PM PO) Take 15 tablets by mouth once.   Yes [provider]  propranolol (INDERAL) 20 MG tablet Take 20 mg by mouth 3 (three) times daily. 10/13/17  Yes [provider]  traZODone (DESYREL) 100 MG tablet Take 1 tablet (100 mg total) by mouth at bedtime as needed and may repeat dose one time if needed for sleep. 07/28/17  Yes Starkes, Gayland Curry, FNP  fluticasone (FLONASE) 50  MCG/ACT nasal spray Place 2 sprays into both nostrils daily. Patient not taking: Reported on 11/02/2017 10/29/17   Horton, Barbette Hair, MD  gabapentin (NEURONTIN) 400 MG capsule Take 1 capsule (400 mg total) by mouth 3 (three) times daily. Patient not taking: Reported on 11/02/2017 07/28/17   Nanci Pina, FNP  naproxen (NAPROSYN) 500 MG tablet Take 1 tablet (500 mg total) by mouth 2 (two) times daily. Patient not taking: Reported on 11/02/2017 08/28/17   Charlesetta Shanks, MD  orphenadrine (NORFLEX) 100 MG tablet Take 1 tablet (100 mg total) by mouth 2 (two) times daily. Patient not taking: Reported on 11/02/2017 08/28/17   Charlesetta Shanks, MD  oxyCODONE-acetaminophen (PERCOCET/ROXICET) 5-325 MG tablet Take 1 tablet by mouth every 6 (six) hours as needed for severe pain. Patient not taking: Reported on 11/02/2017 10/13/17   Glyn Ade, PA-C  QUEtiapine (SEROQUEL) 300 MG tablet Take 1 tablet (300 mg total) by mouth at bedtime. Patient not taking: Reported on 11/02/2017 07/28/17   Nanci Pina, FNP  traMADol (ULTRAM) 50 MG tablet 2 tablets every 6 hours may be taken in combination with acetaminophen and naproxen. Patient not taking: Reported on 11/02/2017 08/28/17   Charlesetta Shanks, MD    Family History Family History  Problem Relation Age of Onset  . Asthma Mother   . Ovarian cancer Mother   . Cancer Mother        OVARIAN  . Varicose Veins Mother   . Heart disease Father   . Peripheral vascular disease Father   . Cancer Maternal Grandmother        LUNG- LUNG  . Colon cancer Neg Hx     Social History Social History   Tobacco Use  . Smoking status: Current Some Day Smoker    Packs/day: 1.00    Years: 42.00    Pack years: 42.00    Types: Cigarettes    Last attempt to quit: 09/25/2016    Years since quitting: 1.1  . Smokeless tobacco: Former Systems developer    Quit date: 06/13/2016  Substance Use Topics  . Alcohol use: No    Alcohol/week: 0.0 oz  . Drug use: No     Allergies   Latuda  [lurasidone hcl] and Sulfa antibiotics   Review of Systems Review of Systems  Constitutional: Positive for appetite change, chills and fever.  HENT: Positive for congestion, postnasal drip, rhinorrhea and sinus pain.   Eyes: Negative.   Respiratory: Positive for cough and shortness of breath.   Cardiovascular: Negative for chest pain, palpitations and leg swelling.  Gastrointestinal: Negative.   Musculoskeletal: Negative.   Skin: Negative.   Neurological: Negative.   Psychiatric/Behavioral: Positive for dysphoric mood, sleep disturbance and suicidal  ideas.     Physical Exam Updated Vital Signs BP 133/85 (BP Location: Right Arm)   Pulse 85   Temp 98.5 F (36.9 C) (Oral)   Resp 19   Ht 5\' 5"  (1.651 m)   Wt 81.6 kg (180 lb)   SpO2 94%   BMI 29.95 kg/m   Physical Exam  Constitutional: She is oriented to person, place, and time. She appears well-developed and well-nourished. She is cooperative.  Tearful throughout most of the interview.  HENT:  Head: Normocephalic and atraumatic.  Right Ear: Tympanic membrane, external ear and ear canal normal.  Left Ear: Tympanic membrane, external ear and ear canal normal.  Nose: Mucosal edema present. Right sinus exhibits maxillary sinus tenderness. Left sinus exhibits maxillary sinus tenderness.  Mouth/Throat: Uvula is midline and oropharynx is clear and moist. Mucous membranes are dry. No posterior oropharyngeal edema or posterior oropharyngeal erythema.  Eyes: Pupils are equal, round, and reactive to light. Conjunctivae, EOM and lids are normal.  Neck: Normal range of motion and full passive range of motion without pain. Neck supple. No spinous process tenderness and no muscular tenderness present. No neck rigidity. Normal range of motion present.  Cardiovascular: Normal rate, regular rhythm, normal heart sounds and intact distal pulses.  Pulmonary/Chest: Effort normal. No tachypnea. No respiratory distress. She has no decreased breath  sounds. She has wheezes. She has no rhonchi. She has no rales.  Abdominal: Soft. Normal appearance and bowel sounds are normal. There is no tenderness.  Neurological: She is alert and oriented to person, place, and time. She has normal strength. No cranial nerve deficit or sensory deficit. She exhibits normal muscle tone. GCS eye subscore is 4. GCS verbal subscore is 5. GCS motor subscore is 6.  Skin: Skin is warm and intact. Capillary refill takes 2 to 3 seconds. She is not diaphoretic. No cyanosis. No pallor.  Psychiatric: Her speech is normal. Her mood appears anxious. She is withdrawn. She is not actively hallucinating. Cognition and memory are normal. She exhibits a depressed mood. She expresses suicidal ideation. She expresses suicidal plans.  Depressed mood with congruent affect. Thought process is logical and linear. Not goal directed or future oriented. Not exhibiting signs of psychosis such as being internally stimulated, responding to internal stimuli or thought blocking. She is attentive.  Nursing note and vitals reviewed.    ED Treatments / Results  Labs (all labs ordered are listed, but only abnormal results are displayed) Labs Reviewed  COMPREHENSIVE METABOLIC PANEL - Abnormal; Notable for the following components:      Result Value   Glucose, Bld 147 (*)    All other components within normal limits  ACETAMINOPHEN LEVEL - Abnormal; Notable for the following components:   Acetaminophen (Tylenol), Serum <10 (*)    All other components within normal limits  CBC - Abnormal; Notable for the following components:   WBC 15.5 (*)    All other components within normal limits  CULTURE, BLOOD (ROUTINE X 2)  CULTURE, BLOOD (ROUTINE X 2)  ETHANOL  SALICYLATE LEVEL  RAPID URINE DRUG SCREEN, HOSP PERFORMED  I-STAT BETA HCG BLOOD, ED ([facility], [facility], [facility] ONLY)    EKG EKG Interpretation  Date/Time:  Thursday November 02 2017 16:20:40 EDT Ventricular Rate:  109 PR Interval:    QRS  Duration: 82 QT Interval:  345 QTC Calculation: 465 R Axis:   74 Text Interpretation:  Sinus tachycardia Abnormal R-wave progression, early transition slt Confirmed by Daleen Bo [phone]) on 11/02/2017 7:26:38 PM  Radiology Dg Chest 2 View  Result Date: 11/02/2017 CLINICAL DATA:  Shortness of breath.  Cough. EXAM: CHEST - 2 VIEW COMPARISON:  Two-view chest x-ray 10/29/2017 and 12/26/2016. FINDINGS: The heart size is normal. The lungs are clear. Postsurgical changes at the right upper lobe are stable. There is no edema or effusion. No focal airspace disease is present. The visualized soft tissues and bony thorax are unremarkable. IMPRESSION: 1. No acute abnormality or significant interval change. 2. Stable postsurgical changes in the right upper lobe. Electronically Signed   By: San Morelle M.D.   On: 11/02/2017 17:06    Procedures Procedures (including critical care time)  Medications Ordered in ED Medications  dextromethorphan-guaiFENesin (MUCINEX DM) 30-600 MG per 12 hr tablet 1 tablet (1 tablet Oral Given 11/02/17 1810)  benzonatate (TESSALON) capsule 100 mg (100 mg Oral Given 11/02/17 1810)  amoxicillin-clavulanate (AUGMENTIN) 875-125 MG per tablet 1 tablet (has no administration in time range)  acetaminophen (TYLENOL) tablet 650 mg (650 mg Oral Given 11/02/17 1612)  sodium chloride 0.9 % bolus 1,000 mL (0 mLs Intravenous Stopped 11/02/17 1716)  ipratropium-albuterol (DUONEB) 0.5-2.5 (3) MG/3ML nebulizer solution 3 mL (3 mLs Nebulization Given 11/02/17 1620)  sodium chloride 0.9 % bolus 1,000 mL ( Intravenous Stopped 11/02/17 2131)     Initial Impression / Assessment and Plan / ED Course  Triage vital signs and the nursing notes have been reviewed.  Pertinent labs & imaging results that were available during care of the patient were reviewed and considered in medical decision making (see chart for details).  Patient presents for suicidal ideation with a reported overdose attempt on  Saturday 10/28/17. She is actively suicidal and had plans to go to the store today and buy more medications for overdose, but was stopped by her daughter and brought to the ED. Despite having established care with a psychiatrist, seeing a therapist regularly and taking Celexa, patient continues to report worsening depression. Her thought process is logical and linear, but she is unable to formulate goal directed or future oriented thoughts. Denies HI and AVH. She would benefit from inpatient psychiatric hospitalization as she is a danger to herself. TTS consulted for psychiatric evaluation.  Clinical Course as of Nov 03 2151  Thu Nov 02, 2017  1635 Febrile in triage at 100.42F. Tylenol 650mg  x1 ordered. Tachycardic in triage at 140bpm. Dry mucous membranes on exam. 1L NS bolus x1 ordered for hydration and to assist with tachycardia.   [GM]  1656 Elevated WBC is indicative of acute stressor or infection.  TTS consult placed.   [GM]  2683 CXR showed no acute parenchymal or pleural disease. No signs of consolidation or vascular congestion that could be causing patient's symptoms.   Repeat pulmonary exam was done. Lungs were clear to auscultation bilaterally. Duo-Neb treatment given earlier in the day. Oxygen saturations have remained above 95%. Given normal pulmonary exam, likely sinusitis the cause of symptoms. Given symptoms have persisted > 7 days, will initiate antibiotic therapy for sinusitis with Augmentin.   [GM]  1911 TTS evaluated the patient and will hold patient overnight for observation   [GM]    Clinical Course User Index [GM] Amarah Brossman, Jonelle Sports, PA-C   Medical work-up for patient's upper respiratory symptoms and cough completed. These symptoms have been going on for 1 week without much improvement despite intervention. Evaluation today does not reveal any emergent illnesses, but is consistent with sinusitis. Will initiate oral antibiotic therapy. From a medical perpsective, patient is  cleared.  Final Clinical Impressions(s) / ED Diagnoses  1. MDD. Inpatient psych hospitalization recommended. TTS consulted. Will re-order pt's home psych medications, Celexa and Trazodone. 2. Acute Sinusitis. Augmentin prescribed. Decongestant and antitussives ordered for symptom relief.  Dispo: TTS consulted.  Final diagnoses:  Severe episode of recurrent major depressive disorder, without psychotic features [facility])  Acute non-recurrent sinusitis, unspecified location    ED Discharge Orders    None      Romie Jumper, PA-C 11/02/17 [address] 11/02/17 2154    Romie Jumper, PA-C 11/02/17 2155    Daleen Bo, MD 11/02/17 2342

## 2017-11-03 ENCOUNTER — Other Ambulatory Visit: Payer: Self-pay

## 2017-11-03 ENCOUNTER — Inpatient Hospital Stay (HOSPITAL_COMMUNITY)
Admission: AD | Admit: 2017-11-03 | Discharge: 2017-11-16 | DRG: 885 | Disposition: A | Discharge status: Other Acute Inpatient Hospital | Payer: Medicare HMO | Source: Intra-hospital | Attending: Psychiatry | Admitting: Psychiatry

## 2017-11-03 ENCOUNTER — Encounter (HOSPITAL_COMMUNITY): Payer: Self-pay

## 2017-11-03 DIAGNOSIS — Z818 Family history of other mental and behavioral disorders: Secondary | ICD-10-CM

## 2017-11-03 DIAGNOSIS — F332 Major depressive disorder, recurrent severe without psychotic features: Secondary | ICD-10-CM | POA: Diagnosis present

## 2017-11-03 DIAGNOSIS — Z7951 Long term (current) use of inhaled steroids: Secondary | ICD-10-CM | POA: Diagnosis not present

## 2017-11-03 DIAGNOSIS — Z90710 Acquired absence of both cervix and uterus: Secondary | ICD-10-CM

## 2017-11-03 DIAGNOSIS — F4010 Social phobia, unspecified: Secondary | ICD-10-CM | POA: Diagnosis present

## 2017-11-03 DIAGNOSIS — Z86010 Personal history of colonic polyps: Secondary | ICD-10-CM | POA: Diagnosis not present

## 2017-11-03 DIAGNOSIS — F410 Panic disorder [episodic paroxysmal anxiety] without agoraphobia: Secondary | ICD-10-CM | POA: Diagnosis present

## 2017-11-03 DIAGNOSIS — G629 Polyneuropathy, unspecified: Secondary | ICD-10-CM | POA: Diagnosis not present

## 2017-11-03 DIAGNOSIS — R45851 Suicidal ideations: Secondary | ICD-10-CM | POA: Diagnosis not present

## 2017-11-03 DIAGNOSIS — Z79899 Other long term (current) drug therapy: Secondary | ICD-10-CM | POA: Diagnosis not present

## 2017-11-03 DIAGNOSIS — J069 Acute upper respiratory infection, unspecified: Secondary | ICD-10-CM | POA: Diagnosis not present

## 2017-11-03 DIAGNOSIS — Z882 Allergy status to sulfonamides status: Secondary | ICD-10-CM

## 2017-11-03 DIAGNOSIS — I739 Peripheral vascular disease, unspecified: Secondary | ICD-10-CM | POA: Diagnosis not present

## 2017-11-03 DIAGNOSIS — J449 Chronic obstructive pulmonary disease, unspecified: Secondary | ICD-10-CM | POA: Diagnosis present

## 2017-11-03 DIAGNOSIS — Z888 Allergy status to other drugs, medicaments and biological substances status: Secondary | ICD-10-CM | POA: Diagnosis not present

## 2017-11-03 DIAGNOSIS — Z915 Personal history of self-harm: Secondary | ICD-10-CM

## 2017-11-03 DIAGNOSIS — Z23 Encounter for immunization: Secondary | ICD-10-CM

## 2017-11-03 DIAGNOSIS — Z609 Problem related to social environment, unspecified: Secondary | ICD-10-CM | POA: Diagnosis not present

## 2017-11-03 DIAGNOSIS — Z79891 Long term (current) use of opiate analgesic: Secondary | ICD-10-CM

## 2017-11-03 DIAGNOSIS — M25532 Pain in left wrist: Secondary | ICD-10-CM | POA: Diagnosis present

## 2017-11-03 DIAGNOSIS — M7989 Other specified soft tissue disorders: Secondary | ICD-10-CM | POA: Diagnosis not present

## 2017-11-03 DIAGNOSIS — Z8249 Family history of ischemic heart disease and other diseases of the circulatory system: Secondary | ICD-10-CM

## 2017-11-03 DIAGNOSIS — G4700 Insomnia, unspecified: Secondary | ICD-10-CM | POA: Diagnosis present

## 2017-11-03 DIAGNOSIS — I951 Orthostatic hypotension: Secondary | ICD-10-CM | POA: Diagnosis not present

## 2017-11-03 DIAGNOSIS — F17210 Nicotine dependence, cigarettes, uncomplicated: Secondary | ICD-10-CM | POA: Diagnosis present

## 2017-11-03 DIAGNOSIS — M791 Myalgia, unspecified site: Secondary | ICD-10-CM | POA: Diagnosis not present

## 2017-11-03 DIAGNOSIS — R69 Illness, unspecified: Secondary | ICD-10-CM | POA: Diagnosis not present

## 2017-11-03 DIAGNOSIS — T50902A Poisoning by unspecified drugs, medicaments and biological substances, intentional self-harm, initial encounter: Secondary | ICD-10-CM | POA: Diagnosis not present

## 2017-11-03 DIAGNOSIS — T1491XA Suicide attempt, initial encounter: Secondary | ICD-10-CM | POA: Diagnosis not present

## 2017-11-03 DIAGNOSIS — R918 Other nonspecific abnormal finding of lung field: Secondary | ICD-10-CM | POA: Diagnosis present

## 2017-11-03 DIAGNOSIS — T148XXA Other injury of unspecified body region, initial encounter: Secondary | ICD-10-CM

## 2017-11-03 DIAGNOSIS — F419 Anxiety disorder, unspecified: Secondary | ICD-10-CM | POA: Diagnosis not present

## 2017-11-03 DIAGNOSIS — M8580 Other specified disorders of bone density and structure, unspecified site: Secondary | ICD-10-CM | POA: Diagnosis not present

## 2017-11-03 MED ORDER — GABAPENTIN 400 MG PO CAPS
400.0000 mg | ORAL_CAPSULE | Freq: Three times a day (TID) | ORAL | Status: DC
  Administered 2017-11-03: 400 mg via ORAL
  Filled 2017-11-03: qty 1

## 2017-11-03 MED ORDER — ACETAMINOPHEN 325 MG PO TABS
650.0000 mg | ORAL_TABLET | Freq: Four times a day (QID) | ORAL | Status: DC | PRN
Start: 2017-11-03 — End: 2017-11-16
  Administered 2017-11-05 – 2017-11-11 (×5): 650 mg via ORAL
  Filled 2017-11-03 (×5): qty 2

## 2017-11-03 MED ORDER — MAGNESIUM HYDROXIDE 400 MG/5ML PO SUSP: 30.0000 mL | Freq: Every day | ORAL | Status: DC | PRN

## 2017-11-03 MED ORDER — TRAZODONE HCL 100 MG PO TABS
100.0000 mg | ORAL_TABLET | Freq: Once | ORAL | Status: AC
  Administered 2017-11-03: 100 mg via ORAL
  Filled 2017-11-03: qty 1

## 2017-11-03 MED ORDER — ALUM & MAG HYDROXIDE-SIMETH 200-200-20 MG/5ML PO SUSP: 30.0000 mL | ORAL | Status: DC | PRN

## 2017-11-03 MED ORDER — TRAZODONE HCL 50 MG PO TABS: 50.0000 mg | ORAL_TABLET | Freq: Every evening | ORAL | Status: DC | PRN

## 2017-11-03 MED ORDER — BENZONATATE 100 MG PO CAPS
100.0000 mg | ORAL_CAPSULE | Freq: Three times a day (TID) | ORAL | Status: DC
  Administered 2017-11-04 – 2017-11-08 (×15): 100 mg via ORAL
  Filled 2017-11-03 (×20): qty 1

## 2017-11-03 MED ORDER — GABAPENTIN 400 MG PO CAPS
400.0000 mg | ORAL_CAPSULE | Freq: Three times a day (TID) | ORAL | Status: DC
Start: 2017-11-03 — End: 2017-11-14
  Administered 2017-11-03 – 2017-11-14 (×32): 400 mg via ORAL
  Filled 2017-11-03 (×39): qty 1

## 2017-11-03 MED ORDER — DULOXETINE HCL 30 MG PO CPEP: 60.0000 mg | ORAL_CAPSULE | Freq: Every day | ORAL | Status: DC

## 2017-11-03 MED ORDER — HYDROXYZINE HCL 25 MG PO TABS
25.0000 mg | ORAL_TABLET | Freq: Three times a day (TID) | ORAL | Status: DC | PRN
  Administered 2017-11-03 – 2017-11-16 (×17): 25 mg via ORAL
  Filled 2017-11-03 (×16): qty 1

## 2017-11-03 MED ORDER — AMOXICILLIN-POT CLAVULANATE 875-125 MG PO TABS
1.0000 | ORAL_TABLET | Freq: Two times a day (BID) | ORAL | Status: DC
  Administered 2017-11-03 – 2017-11-08 (×12): 1 via ORAL
  Filled 2017-11-03 (×16): qty 1

## 2017-11-03 MED ORDER — DULOXETINE HCL 60 MG PO CPEP
60.0000 mg | ORAL_CAPSULE | Freq: Every day | ORAL | Status: DC
  Administered 2017-11-04: 60 mg via ORAL
  Filled 2017-11-03 (×3): qty 1

## 2017-11-03 MED ORDER — TRAZODONE HCL 100 MG PO TABS
100.0000 mg | ORAL_TABLET | Freq: Every evening | ORAL | Status: DC | PRN
  Administered 2017-11-03: 100 mg via ORAL
  Filled 2017-11-03: qty 1

## 2017-11-03 MED ORDER — DM-GUAIFENESIN ER 30-600 MG PO TB12
1.0000 | ORAL_TABLET | Freq: Two times a day (BID) | ORAL | Status: DC
  Administered 2017-11-03 – 2017-11-08 (×10): 1 via ORAL
  Filled 2017-11-03 (×16): qty 1

## 2017-11-03 MED ORDER — DM-GUAIFENESIN ER 30-600 MG PO TB12
1.0000 | ORAL_TABLET | Freq: Two times a day (BID) | ORAL | Status: DC
  Filled 2017-11-03 (×2): qty 1

## 2017-11-03 NOTE — ED Notes (Signed)
Report called to [facility]. Called [facility] transportation.

## 2017-11-03 NOTE — Tx Team (Signed)
Initial Treatment Plan 11/03/2017 6:05 PM Gardiner Ramus [id number]    PATIENT STRESSORS: Financial difficulties Health problems- depression   PATIENT STRENGTHS: Ability for insight Average or above average intelligence Capable of independent living Communication skills Supportive family/friends   PATIENT IDENTIFIED PROBLEMS:   "hopelessness"    " get out of this depression"               DISCHARGE CRITERIA:  Improved stabilization in mood, thinking, and/or behavior Reduction of life-threatening or endangering symptoms to within safe limits  PRELIMINARY DISCHARGE PLAN: Attend aftercare/continuing care group Outpatient therapy Return to previous living arrangement  PATIENT/FAMILY INVOLVEMENT: This treatment plan has been presented to and reviewed with the patient, Daun Rens   The patient has been given the opportunity to ask questions and make suggestions.  Waymond Cera, RN 11/03/2017, 6:05 PM

## 2017-11-03 NOTE — BH Assessment (Addendum)
[facility] Assessment Progress Note  Per Buford Dresser, DO, this pt requires psychiatric hospitalization at this time.  Leonia Reader, RN, [facility] has pre-assigned pt to [facility] Rm [id number]  This bed is currently occupied; PLEASE DO NOT SEND PATIENT UNTIL [facility] CALLS TO NOTIFY THAT [facility].  Pt has signed Voluntary Admission and Consent for Treatment, as well as Consent to Release Information to pt's son, to Doroteo Glassman, PhD, and to a psychiatrist named Dr Jyl Heinz, affiliated with [facility].  A notification call has been placed to Doroteo Glassman, but contact information for the psychiatrist could not be found.  Signed forms have been faxed to [facility].  Pt's nurse, Diane, has been notified, and agrees to send original paperwork along with pt via [facility], and to call report to [phone].  Jalene Mullet, [place] [facility] Coordinator [phone]   Addendum:  [place], RN, [facility] calls to report that [facility] is NOW READY TO RECEIVE PT.  Pt's nurse, Diane, has been notified.  Jalene Mullet, [place] Coordinator [phone]

## 2017-11-03 NOTE — ED Notes (Signed)
Calm and cooperative. Able to make needs known and complete ADLs. Affect flat, mood depressed. Pt is tearful as she contemplates a hopeless future related to soon loosing her 15-year alimony check. Has disability but cannot conceive how she will live on that.

## 2017-11-03 NOTE — ED Notes (Signed)
Transported to [facility] by [employer] transportation. All belongings returned to pt who signed for same. Pt calm and cooperative.

## 2017-11-03 NOTE — Consult Note (Addendum)
[facility] Consult   Reason for Consult:   Referring Physician:  EDP Patient Identification: Crystal Duarte MRN:  [id number] Principal Diagnosis: Severe episode of recurrent major depressive disorder, without psychotic features [facility]) Diagnosis:   Patient Active Problem List   Diagnosis Date Noted  . MDD (major depressive disorder), recurrent severe, without psychosis ([place]) [F33.2] 07/18/2017  . DOE (dyspnea on exertion) [R06.09] 12/26/2016  . COPD GOLD II  [J44.9] 12/26/2016  . Orthostatic hypotension [I95.1] 12/14/2016  . Chest tightness [R07.89] 12/14/2016  . Weight loss [R63.4] 12/14/2016  . Routine general medical examination at a health care facility [Z00.00] 10/09/2015  . 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 Hx of adenomatous polyp of colon [Z86.010] 12/04/2014  . Memory loss [R41.3] 10/04/2014  . Affective bipolar disorder ([place]) [F31.9] 03/12/2014  . Peripheral vascular disease, unspecified ([place]) [I73.9] 10/10/2013  . Smoker [F17.200] 08/28/2012  . Menopause [Z78.0] 08/28/2012  . Multiple pulmonary nodules [R91.8] 11/12/2010    Total Time spent with patient: 45 minutes  Subjective:   Crystal Duarte is a 57 y.o. female patient admitted with suicide attempt.  HPI:  Pt was seen and chart reviewed with treatment team and Dr Mariea Clonts. Pt stated she "just doesn't want to wake up anymore." She reports poor PO intake and lying in bed for 4 days due to feeling ill from bronchitis. Pt attempted suicide by overdose on Saturday (Buspar and Trazodone) and was planning to go to the store yesterday to buy more pills to overdose on but her daughter stopped her and brought her to [facility]. Pt has been hospitalized in the past for depression and suicidal ideation. Pt has also had ECT in the past for her depression and stated it worked for a while. Pt stated she has not taken her psychiatric medication for four days. Pt's UDS and BAL are negative. Lab work is unremarkable, EKG  WNL, no other diagnostic tests ordered during this admission. Pt would benefit from an inpatient psychiatric admission for crisis stabilization and medication management.   Past Psychiatric History: As above  Risk to Self: Suicidal Ideation: Yes-Currently Present Suicidal Intent: Yes-Currently Present Is patient at risk for suicide?: Yes Suicidal Plan?: Yes-Currently Present Specify Current Suicidal Plan: OD on meds or OTC pills Access to Means: Yes What has been your use of drugs/alcohol within the last 12 months?: n/a How many times?: 5 Other Self Harm Risks: none Triggers for Past Attempts: Unknown, Unpredictable, Family contact Intentional Self Injurious Behavior: None Risk to Others: Homicidal Ideation: No Thoughts of Harm to Others: No Current Homicidal Intent: No Current Homicidal Plan: No Access to Homicidal Means: No History of harm to others?: No Assessment of Violence: None Noted Does patient have access to weapons?: No Criminal Charges Pending?: No Does patient have a court date: No Prior Inpatient Therapy: Prior Inpatient Therapy: Yes Prior Therapy Dates: 2019 Prior Therapy Facilty/Provider(s): [place]; [facility] Reason for Treatment: Depression Prior Outpatient Therapy: Prior Outpatient Therapy: Yes Prior Therapy Dates: ongoing Prior Therapy Facilty/Provider(s): [facility] Reason for Treatment: Depression Does patient have an ACCT team?: No Does patient have Intensive In-House Services?  : No Does patient have [facility] services? : No Does patient have P4CC services?: No  Past Medical History:  Past Medical History:  Diagnosis Date  . Coccidioidomycosis, pulmonary ([place])   . Depression   . Hx of adenomatous polyp of colon 12/04/2014  . Osteopenia 11/2016   T score -2.2 FRAX 17%/2.4%  . Panic attack     Past  Surgical History:  Procedure Laterality Date  . ABDOMINAL HYSTERECTOMY    . COLONOSCOPY  2001   hemorrhoidectomy  . LUNG SURGERY  2001   VATS surgery    . OVARIAN CYST REMOVAL  1970's  . VESICOVAGINAL FISTULA CLOSURE W/ TAH  2005   Family History:  Family History  Problem Relation Age of Onset  . Asthma Mother   . Ovarian cancer Mother   . Cancer Mother        OVARIAN  . Varicose Veins Mother   . Heart disease Father   . Peripheral vascular disease Father   . Cancer Maternal Grandmother        LUNG- LUNG  . Colon cancer Neg Hx    Family Psychiatric  History: Unknown Social History:  Social History   Substance and Sexual Activity  Alcohol Use No  . Alcohol/week: 0.0 oz     Social History   Substance and Sexual Activity  Drug Use No    Social History   Socioeconomic History  . Marital status: Divorced    Spouse name: Not on file  . Number of children: 2  . Years of education: Not on file  . Highest education level: Not on file  Occupational History  . Occupation: Forensic psychologist: [employer]  . Financial resource strain: Not on file  . Food insecurity:    Worry: Not on file    Inability: Not on file  . Transportation needs:    Medical: Not on file    Non-medical: Not on file  Tobacco Use  . Smoking status: Current Some Day Smoker    Packs/day: 1.00    Years: 42.00    Pack years: 42.00    Types: Cigarettes    Last attempt to quit: 09/25/2016    Years since quitting: 1.1  . Smokeless tobacco: Former Systems developer    Quit date: 06/13/2016  Substance and Sexual Activity  . Alcohol use: No    Alcohol/week: 0.0 oz  . Drug use: No  . Sexual activity: Not on file  Lifestyle  . Physical activity:    Days per week: Not on file    Minutes per session: Not on file  . Stress: Not on file  Relationships  . Social connections:    Talks on phone: Not on file    Gets together: Not on file    Attends religious service: Not on file    Active member of club or organization: Not on file    Attends meetings of clubs or organizations: Not on file    Relationship status: Not on file  Other  Topics Concern  . Not on file  Social History Narrative  . Not on file   Additional Social History: N/A    Allergies:   Allergies  Allergen Reactions  . Latuda [Lurasidone Hcl] Anxiety  . Sulfa Antibiotics Rash    Only in sunlight     Labs:  Results for orders placed or performed during the hospital encounter of 11/02/17 (from the past 48 hour(s))  Comprehensive metabolic panel     Status: Abnormal   Collection Time: 11/02/17  4:00 PM  Result Value Ref Range   Sodium 139 135 - 145 mmol/L   Potassium 3.6 3.5 - 5.1 mmol/L   Chloride 104 98 - 111 mmol/L   CO2 24 22 - 32 mmol/L   Glucose, Bld 147 (H) 70 - 99 mg/dL   BUN 16 6 - 20  mg/dL   Creatinine, Ser 0.94 0.44 - 1.00 mg/dL   Calcium 9.4 8.9 - 10.3 mg/dL   Total Protein 7.8 6.5 - 8.1 g/dL   Albumin 4.1 3.5 - 5.0 g/dL   AST 19 15 - 41 U/L   ALT 16 0 - 44 U/L   Alkaline Phosphatase 97 38 - 126 U/L   Total Bilirubin 0.8 0.3 - 1.2 mg/dL   GFR calc non Af Amer >60 >60 mL/min   GFR calc Af Amer >60 >60 mL/min    Comment: (NOTE) The eGFR has been calculated using the CKD EPI equation. This calculation has not been validated in all clinical situations. eGFR's persistently <60 mL/min signify possible Chronic Kidney Disease.    Anion gap 11 5 - 15    Comment: Performed at [facility], [place] [address], [place], [place] [address]  Ethanol     Status: None   Collection Time: 11/02/17  4:00 PM  Result Value Ref Range   Alcohol, Ethyl (B) <10 <10 mg/dL    Comment: (NOTE) Lowest detectable limit for serum alcohol is 10 mg/dL. For medical purposes only. Performed at [facility], [place] [address], [place], [place] [address]   Salicylate level     Status: None   Collection Time: 11/02/17  4:00 PM  Result Value Ref Range   Salicylate Lvl <4.7 2.8 - 30.0 mg/dL    Comment: Performed at [facility], [place] [address], [place], [place] [address]  Acetaminophen level     Status:  Abnormal   Collection Time: 11/02/17  4:00 PM  Result Value Ref Range   Acetaminophen (Tylenol), Serum <10 (L) 10 - 30 ug/mL    Comment: (NOTE) Therapeutic concentrations vary significantly. A range of 10-30 ug/mL  may be an effective concentration for many patients. However, some  are best treated at concentrations outside of this range. Acetaminophen concentrations >150 ug/mL at 4 hours after ingestion  and >50 ug/mL at 12 hours after ingestion are often associated with  toxic reactions. Performed at [facility], [place] [address] [place], [place] [address]   cbc     Status: Abnormal   Collection Time: 11/02/17  4:00 PM  Result Value Ref Range   WBC 15.5 (H) 4.0 - 10.5 K/uL   RBC 4.31 3.87 - 5.11 MIL/uL   Hemoglobin 13.4 12.0 - 15.0 g/dL   HCT 40.6 36.0 - 46.0 %   MCV 94.2 78.0 - 100.0 fL   MCH 31.1 26.0 - 34.0 pg   MCHC 33.0 30.0 - 36.0 g/dL   RDW 13.0 11.5 - 15.5 %   Platelets 366 150 - 400 K/uL    Comment: Performed at [facility], [place] [address], [place], [place] [address]  Blood culture (routine x 2)     Status: None (Preliminary result)   Collection Time: 11/02/17  4:00 PM  Result Value Ref Range   Specimen Description      BLOOD RIGHT ARM Performed at [facility] [address] [place], [place] [address]    Special Requests      BOTTLES DRAWN AEROBIC AND ANAEROBIC Blood Culture adequate volume Performed at [facility] [address], [place], [place] [address]    Culture      NO GROWTH < 24 HOURS Performed at [facility] [address], [place], [place] [address]    Report Status PENDING   Blood culture (routine x 2)  Status: None (Preliminary result)   Collection Time: 11/02/17  4:00 PM  Result Value Ref Range   Specimen Description      BLOOD RIGHT FOREARM Performed at [facility] [address], [place], [place] [address]    Special Requests       BOTTLES DRAWN AEROBIC AND ANAEROBIC Blood Culture adequate volume Performed at [facility] [address] [place], [place] [address]    Culture      NO GROWTH < 24 HOURS Performed at [facility] [address], [place], [place] [address]    Report Status PENDING   I-Stat beta hCG blood, ED     Status: None   Collection Time: 11/02/17  4:11 PM  Result Value Ref Range   I-stat hCG, quantitative <5.0 <5 mIU/mL   Comment 3            Comment:   GEST. AGE      CONC.  (mIU/mL)   <=1 WEEK        5 - 50     2 WEEKS       50 - 500     3 WEEKS       100 - 10,000     4 WEEKS     1,000 - 30,000        FEMALE AND NON-PREGNANT FEMALE:     LESS THAN 5 mIU/mL   Rapid urine drug screen (hospital performed)     Status: None   Collection Time: 11/02/17  9:48 PM  Result Value Ref Range   Opiates NONE DETECTED NONE DETECTED   Cocaine NONE DETECTED NONE DETECTED   Benzodiazepines NONE DETECTED NONE DETECTED   Amphetamines NONE DETECTED NONE DETECTED   Tetrahydrocannabinol NONE DETECTED NONE DETECTED   Barbiturates NONE DETECTED NONE DETECTED    Comment: (NOTE) DRUG SCREEN FOR MEDICAL PURPOSES ONLY.  IF CONFIRMATION IS NEEDED FOR ANY PURPOSE, NOTIFY LAB WITHIN 5 DAYS. LOWEST DETECTABLE LIMITS FOR URINE DRUG SCREEN Drug Class                     Cutoff (ng/mL) Amphetamine and metabolites    1000 Barbiturate and metabolites    200 Benzodiazepine                 295 Tricyclics and metabolites     300 Opiates and metabolites        300 Cocaine and metabolites        300 THC                            50 Performed at [facility], [place] [address], [place], [place] [address]     Current Facility-Administered Medications  Medication Dose Route Frequency Provider Last Rate Last Dose  . amoxicillin-clavulanate (AUGMENTIN) 875-125 MG per tablet 1 tablet  1 tablet Oral Q12H Mortis, Gabrielle I, PA-C   1 tablet at 11/03/17 1034  . benzonatate  (TESSALON) capsule 100 mg  100 mg Oral TID Maury Dus I, PA-C   100 mg at 11/03/17 1034  . citalopram (CELEXA) tablet 40 mg  40 mg Oral Daily Mortis, Gabrielle I, PA-C   40 mg at 11/03/17 1035  . dextromethorphan-guaiFENesin (MUCINEX DM) 30-600 MG per 12 hr tablet 1 tablet  1 tablet Oral BID Mortis, Gabrielle I, PA-C   1 tablet at 11/03/17 1034  . traZODone (DESYREL) tablet 100 mg  100 mg Oral QHS PRN Mortis, Alvie Heidelberg I, PA-C   100 mg at 11/02/17 2246   Current Outpatient Medications  Medication Sig Dispense Refill  . acetaminophen (TYLENOL) 500 MG tablet Take 2 tablets (1,000 mg total) by mouth every 6 (six) hours as needed. 30 tablet 0  . albuterol (PROVENTIL HFA;VENTOLIN HFA) 108 (90 Base) MCG/ACT inhaler Inhale 2 puffs into the lungs every 4 (four) hours as needed for wheezing or shortness of breath. 1 Inhaler 0  . busPIRone (BUSPAR) 15 MG tablet Take 15 mg by mouth 3 (three) times daily.    . DULoxetine (CYMBALTA) 60 MG capsule Take 1 capsule (60 mg total) by mouth 2 (two) times daily. 60 capsule 0  . Naproxen Sod-diphenhydrAMINE (ALEVE PM PO) Take 15 tablets by mouth once.    . propranolol (INDERAL) 20 MG tablet Take 20 mg by mouth 3 (three) times daily.    . traZODone (DESYREL) 100 MG tablet Take 1 tablet (100 mg total) by mouth at bedtime as needed and may repeat dose one time if needed for sleep. 30 tablet 0  . fluticasone (FLONASE) 50 MCG/ACT nasal spray Place 2 sprays into both nostrils daily. (Patient not taking: Reported on 11/02/2017) 16 g 2  . gabapentin (NEURONTIN) 400 MG capsule Take 1 capsule (400 mg total) by mouth 3 (three) times daily. (Patient not taking: Reported on 11/02/2017) 90 capsule 0  . naproxen (NAPROSYN) 500 MG tablet Take 1 tablet (500 mg total) by mouth 2 (two) times daily. (Patient not taking: Reported on 11/02/2017) 30 tablet 0  . orphenadrine (NORFLEX) 100 MG tablet Take 1 tablet (100 mg total) by mouth 2 (two) times daily. (Patient not taking: Reported on  11/02/2017) 30 tablet 0  . oxyCODONE-acetaminophen (PERCOCET/ROXICET) 5-325 MG tablet Take 1 tablet by mouth every 6 (six) hours as needed for severe pain. (Patient not taking: Reported on 11/02/2017) 12 tablet 0  . QUEtiapine (SEROQUEL) 300 MG tablet Take 1 tablet (300 mg total) by mouth at bedtime. (Patient not taking: Reported on 11/02/2017) 30 tablet 0  . traMADol (ULTRAM) 50 MG tablet 2 tablets every 6 hours may be taken in combination with acetaminophen and naproxen. (Patient not taking: Reported on 11/02/2017) 20 tablet 0    Musculoskeletal: Strength & Muscle Tone: within normal limits Gait & Station: not tested Patient leans: N/A  Psychiatric Specialty Exam: Physical Exam  Nursing note and vitals reviewed. Constitutional: She is oriented to person, place, and time. She appears well-developed and well-nourished.  HENT:  Head: Normocephalic and atraumatic.  Neck: Normal range of motion.  Respiratory: Effort normal.  Musculoskeletal: Normal range of motion.  Neurological: She is alert and oriented to person, place, and time.  Psychiatric: Her speech is normal and behavior is normal. Thought content normal. Her mood appears anxious. Cognition and memory are normal. She expresses impulsivity. She exhibits a depressed mood.    Review of Systems  Psychiatric/Behavioral: Positive for depression and suicidal ideas. Negative for hallucinations, memory loss and substance abuse. The patient is nervous/anxious. The patient does not have insomnia.   All other systems reviewed and are negative.   Blood pressure (!) 113/98, pulse (!) 102, temperature 98.7 F (37.1 C), temperature source Oral, resp. rate 18, height '5\' 5"'$  (1.651 m), weight 180 lb (81.6 kg), SpO2 96 %.Body mass index is 29.95 kg/m.  General Appearance: Casual  Eye Contact:  Fair  Speech:  Clear and Coherent  Volume:  Decreased  Mood:  Depressed, Dysphoric and Hopeless  Affect:  Congruent, Depressed and Flat  Thought Process:   Coherent, Goal Directed, Linear and Descriptions of Associations: Intact  Orientation:  Full (Time, Place, and Person)  Thought Content:  Logical  Suicidal Thoughts:  No  Homicidal Thoughts:  No  Memory:  Immediate;   Good Recent;   Good Remote;   Fair  Judgement:  Fair  Insight:  Fair  Psychomotor Activity:  Normal  Concentration:  Concentration: Fair and Attention Span: Fair  Recall:  [employer] of Knowledge:  Fair  Language:  Good  Akathisia:  No  Handed:  Right  AIMS (if indicated):   N/A  Assets:  Agricultural consultant Housing Social Support  ADL's:  Intact  Cognition:  WNL  Sleep:   Fair     Treatment Plan Summary: Daily contact with patient to assess and evaluate symptoms and progress in treatment and Medication management (see MAR )  Disposition: Recommend psychiatric Inpatient admission when medically cleared. TTS to seek placement  Ethelene Hal, NP 11/03/2017 12:52 PM   Patient seen face-to-face for psychiatric evaluation, chart reviewed and case discussed with the physician extender and developed treatment plan. Reviewed the information documented and agree with the treatment plan.  Buford Dresser, DO 11/03/17 6:02 PM

## 2017-11-03 NOTE — Progress Notes (Signed)
  Patient is a 57 year old divorced female admitted voluntarily from [facility] for increasing depression and suicidal ideation. Pt reports a long history of depression- since 57 years old. Pt reports that she was "putting her affairs in order" for awhile. Pt lives with her 58 year old mother who has been frequently sick. Pt also reports that her stressors as financial- on disability and will be losing her alimony soon. Pt currently denies SI, but endorses feelings of hopelessness and despair. Pt reports not having eaten in a few days and hasn't been sleeping. Pt presents with a cough and reports having bronchitis. Pt presents with sullen affect- depressed mood- frequently tearing up during admission interview. Pt was calm and cooperative answering all questions appropriately.  VS obtained. Skin assessment completed with no skin issues noted. Pt's left wrist slightly swollen from a break she had on May 26th. Belongings searched and secured in locker. Admission paperwork completed and signed. Verbal understanding expressed. Pt oriented to unit. Q 15 min checks initiated for safety.

## 2017-11-04 DIAGNOSIS — Z818 Family history of other mental and behavioral disorders: Secondary | ICD-10-CM

## 2017-11-04 DIAGNOSIS — F332 Major depressive disorder, recurrent severe without psychotic features: Principal | ICD-10-CM

## 2017-11-04 DIAGNOSIS — T50902A Poisoning by unspecified drugs, medicaments and biological substances, intentional self-harm, initial encounter: Secondary | ICD-10-CM

## 2017-11-04 DIAGNOSIS — T1491XA Suicide attempt, initial encounter: Secondary | ICD-10-CM

## 2017-11-04 MED ORDER — BUSPIRONE HCL 15 MG PO TABS
15.0000 mg | ORAL_TABLET | Freq: Three times a day (TID) | ORAL | Status: DC
  Administered 2017-11-04 – 2017-11-16 (×38): 15 mg via ORAL
  Filled 2017-11-04 (×42): qty 1

## 2017-11-04 MED ORDER — TRAZODONE HCL 100 MG PO TABS
200.0000 mg | ORAL_TABLET | Freq: Every day | ORAL | Status: DC
  Administered 2017-11-04 – 2017-11-10 (×7): 200 mg via ORAL
  Filled 2017-11-04 (×6): qty 2
  Filled 2017-11-04: qty 1
  Filled 2017-11-04 (×2): qty 2

## 2017-11-04 MED ORDER — PROPRANOLOL HCL 20 MG PO TABS
20.0000 mg | ORAL_TABLET | Freq: Three times a day (TID) | ORAL | Status: DC
  Administered 2017-11-04 – 2017-11-16 (×37): 20 mg via ORAL
  Filled 2017-11-04: qty 2
  Filled 2017-11-04 (×42): qty 1

## 2017-11-04 MED ORDER — DULOXETINE HCL 30 MG PO CPEP
30.0000 mg | ORAL_CAPSULE | Freq: Once | ORAL | Status: AC
  Administered 2017-11-04: 30 mg via ORAL
  Filled 2017-11-04 (×2): qty 1

## 2017-11-04 MED ORDER — DULOXETINE HCL 30 MG PO CPEP
90.0000 mg | ORAL_CAPSULE | Freq: Every day | ORAL | Status: DC
Start: 2017-11-05 — End: 2017-11-05
  Administered 2017-11-05: 90 mg via ORAL
  Filled 2017-11-04 (×3): qty 3

## 2017-11-04 MED ORDER — PNEUMOCOCCAL VAC POLYVALENT 25 MCG/0.5ML IJ INJ: 0.5000 mL | INJECTION | INTRAMUSCULAR | Status: DC

## 2017-11-04 NOTE — [facility] Group Notes (Signed)
LCSW Group Therapy Note  11/04/2017   10:00--11:00am   Type of Therapy and Topic:  Group Therapy: Anger Cues and Responses  Participation Level:  Did Not Attend   Description of Group:   In this group, patients learned how to recognize the physical, cognitive, emotional, and behavioral responses they have to anger-provoking situations.  They identified a recent time they became angry and how they reacted.  They analyzed how their reaction was possibly beneficial and how it was possibly unhelpful.  The group discussed a variety of healthier coping skills that could help with such a situation in the future.  Deep breathing was practiced briefly.  Therapeutic Goals: 1. Patients will remember their last incident of anger and how they felt emotionally and physically, what their thoughts were at the time, and how they behaved. 2. Patients will identify how their behavior at that time worked for them, as well as how it worked against them. 3. Patients will explore possible new behaviors to use in future anger situations. 4. Patients will learn that anger itself is normal and cannot be eliminated, and that healthier reactions can assist with resolving conflict rather than worsening situations.  Summary of Patient Progress:  Did not attend.  Therapeutic Modalities:   Cognitive Behavioral Therapy  Rolanda Jay
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
## 2017-11-04 NOTE — BH Assessment (Signed)
This Probation officer received call from [place] at [facility] stating patient is interested in ECT.  This Probation officer called Dr. Prescott Gum and informed him about patient. Dr. Prescott Gum stated he will be here Monday, however will be on vacation after that and isn't accepting ECT patients until the 16th.   This Probation officer updated [facility].

## 2017-11-04 NOTE — Progress Notes (Signed)
D: Pt was in bed in her room upon initial approach.  Pt presents with anxious, depressed affect and mood.  Her goal is "just to sleep."  Pt reports she has not been feeling well physically.  Pt denies SI/HI, denies AH, denies pain.  Pt reports VH of "seeing my grandmother, uncle, step-father."  Pt has been isolative to her room for the majority of the evening.  She was excused from evening group.    A: Introduced self to pt.  Actively listened to pt and offered support and encouragement. Medications administered per order.  PRN medication administered for sleep and anxiety.  Q15 minute safety checks maintained.  R: Pt is safe on the unit.  Pt is compliant with medications.  Pt verbally contracts for safety.  Will continue to monitor and assess.

## 2017-11-04 NOTE — Plan of Care (Signed)
  Problem: Safety: Goal: Ability to disclose and discuss suicidal ideas will improve Outcome: Progressing Note:  Pt endorsed SI without a plan tonight.  She verbally contracts for Probation officer.

## 2017-11-04 NOTE — Progress Notes (Signed)
Per Doree Albee the patient is requesting ECT treatment. LCSW has faxed [facility], [facility], [facility], [facility] and [facility] a referral for ECT treatment.   Herbert Moors MSW, LCSW, LCAS Clinical Social Worker 11/04/2017 11:05 AM
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
## 2017-11-04 NOTE — BHH Suicide Risk Assessment (Signed)
[facility] Admission Suicide Risk Assessment   Nursing information obtained from:  Patient Demographic factors:  Divorced or widowed, Caucasian, Unemployed, Low socioeconomic status Current Mental Status:  Suicidal ideation indicated by patient Loss Factors:  Decrease in vocational status, Financial problems / change in socioeconomic status, Loss of significant relationship Historical Factors:  Family history of mental illness or substance abuse Risk Reduction Factors:  Living with another person, especially a relative  Total Time spent with patient: 30 minutes Principal Problem: <principal problem not specified> Diagnosis:   Patient Active Problem List   Diagnosis Date Noted  . Severe episode of recurrent major depressive disorder, without psychotic features ([place]) [F33.2] 07/18/2017  . DOE (dyspnea on exertion) [R06.09] 12/26/2016  . COPD GOLD II  [J44.9] 12/26/2016  . Orthostatic hypotension [I95.1] 12/14/2016  . Chest tightness [R07.89] 12/14/2016  . Weight loss [R63.4] 12/14/2016  . Routine general medical examination at a health care facility [Z00.00] 10/09/2015  . 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 Hx of adenomatous polyp of colon [Z86.010] 12/04/2014  . Memory loss [R41.3] 10/04/2014  . Affective bipolar disorder ([place]) [F31.9] 03/12/2014  . Peripheral vascular disease, unspecified ([place]) [I73.9] 10/10/2013  . Smoker [F17.200] 08/28/2012  . Menopause [Z78.0] 08/28/2012  . Multiple pulmonary nodules [R91.8] 11/12/2010   Subjective Data: Patient is seen and examined.  Patient is a 56 year old female with a long-standing history significant for major depression; recurrent, severe without psychotic features who presented to the [facility] emergency department with suicidal ideation after overdose.  The patient was very nonchalant about things, and discussed the fact that she had placed all of her affairs in order.  She was on her way out to go by more over-the-counter medication  to overdose with.  She had planned this for some time.  She stated that she had taken multiple BuSpar and trazodone pills.  She has been followed by a psychiatrist at [facility], and most recently discussed transcranial magnetic stimulation for treatment.  She has had ECT in the past both at [facility] as well as [facility].  Her last psychiatric hospitalization here was 07/18/2017.  After hospitalization here she continued in the intensive outpatient program at the behavioral health hospital.  She is severely depressed, helpless, hopeless and worthless.  She remains suicidal.  We discussed the possibility of transfer to [facility], [facility] or [facility] for ECT treatment.  She is in agreement with that.  She is been on multiple medications in the past, without tremendous success.  Continued Clinical Symptoms:    The "Alcohol Use Disorders Identification Test", Guidelines for Use in Primary Care, Second Edition.  World Pharmacologist [facility]). Score between 0-7:  no or low risk or alcohol related problems. Score between 8-15:  moderate risk of alcohol related problems. Score between 16-19:  high risk of alcohol related problems. Score 20 or above:  warrants further diagnostic evaluation for alcohol dependence and treatment.   CLINICAL FACTORS:   Depression:   Anhedonia Hopelessness Impulsivity Insomnia   Musculoskeletal: Strength & Muscle Tone: within normal limits Gait & Station: normal Patient leans: N/A  Psychiatric Specialty Exam: Physical Exam  Nursing note and vitals reviewed. Constitutional: She is oriented to person, place, and time. She appears well-developed and well-nourished.  HENT:  Head: Normocephalic and atraumatic.  Respiratory: Effort normal.  Neurological: She is alert and oriented to person, place, and time.    ROS  Blood pressure 112/76, pulse (!) 112,  temperature 99.4 F (37.4  C), temperature source Oral, resp. rate 18, height 5\' 4"  (1.626 m), weight 76.7 kg (169 lb), SpO2 97 %.Body mass index is 29.01 kg/m.  General Appearance: Casual  Eye Contact:  Fair  Speech:  Normal Rate  Volume:  Decreased  Mood:  Depressed  Affect:  Congruent  Thought Process:  Coherent  Orientation:  Full (Time, Place, and Person)  Thought Content:  Logical  Suicidal Thoughts:  Yes.  with intent/plan  Homicidal Thoughts:  No  Memory:  Immediate;   Fair Recent;   Fair Remote;   Fair  Judgement:  Impaired  Insight:  Fair  Psychomotor Activity:  Psychomotor Retardation  Concentration:  Concentration: Fair and Attention Span: Fair  Recall:  [employer] of Knowledge:  Fair  Language:  Fair  Akathisia:  Negative  Handed:  Right  AIMS (if indicated):     Assets:  Desire for Improvement Housing Resilience  ADL's:  Intact  Cognition:  WNL  Sleep:  Number of Hours: 4.25      COGNITIVE FEATURES THAT CONTRIBUTE TO RISK:  None    SUICIDE RISK:   Severe:  Frequent, intense, and enduring suicidal ideation, specific plan, no subjective intent, but some objective markers of intent (i.e., choice of lethal method), the method is accessible, some limited preparatory behavior, evidence of impaired self-control, severe dysphoria/symptomatology, multiple risk factors present, and few if any protective factors, particularly a lack of social support.  PLAN OF CARE: Patient is seen and examined.  Patient is a 57 year old female with a past psychiatric history significant for major depression; severe without psychotic features who presented after an intentional overdose.  Her daughter caught her on her way out to buy more pills to overdose on.  She discusses quite frankly that she had put off her affairs in place, had paid all her bills off, and had attempted to make things "financially okay" for her daughter and grandchildren.  She has been on multiple medications in the past without great  success.  Her outpatient psychiatrist at one point discuss transcranial magnetic stimulation, but the patient was unable to afford treatment at that time.  She has had ECT in the past both the [facility] as well as [facility].  I think the best course of treatment given the severity of her illness is ECT.  We will request possible transfer to [facility] or transfer to [facility].  [address] will not be available for consultation until 11/06/2017.  If were able to arrange transfer to [facility] where her primary psychiatrist is for ECT treatment I think it is in the best interest of the patient to be treated there.  I certify that inpatient services furnished can reasonably be expected to improve the patient's condition.   Sharma Covert, MD 11/04/2017, 10:38 AM

## 2017-11-04 NOTE — BHH Group Notes (Signed)
[facility] Group Notes:  (Nursing/MHT/Case Management/Adjunct)  Date:  11/04/2017  Time:  2:46 PM  Type of Therapy:  Psychoeducational Skills  Participation Level:  Active  Participation Quality:  Appropriate  Affect:  Appropriate  Cognitive:  Appropriate  Insight:  Appropriate  Engagement in Group:  Engaged  Modes of Intervention:  Problem-solving  Summary of Progress/Problems: Pt attended Psychoeducational group with top topic anger management.   Crystal Duarte 11/04/2017, 2:46 PM

## 2017-11-04 NOTE — Progress Notes (Addendum)
D: Pt was in the hallway upon initial approach.  Pt presents with depressed, anxious affect and mood.  She describes her day as "good" and reports she "talked to the doctor" and it "went well."  Pt reports she will be sent for ECT when she leaves [facility].  Her goal is to "try to sleep, get some rest, I've been up all day."  Pt denies HI, denies hallucinations, denies pain.  Pt reports SI without a plan and she verbally contracts for safety.  Pt has been visible in milieu intermittently with few interactions.  She has stayed in her room for the majority of the evening.     A: Introduced self to pt.  Actively listened to pt and offered support and encouragement. Medications administered per order.  PRN medication administered for anxiety.  Q15 minute safety checks maintained.  R: Pt is safe on the unit.  Pt is compliant with medications.  Pt verbally contracts for safety.  Will continue to monitor and assess.

## 2017-11-04 NOTE — Progress Notes (Signed)
D Pt is nervous, hesitant and depressed. She stutters sometimes as she speaks. SHe is focused on her meds and asks this Probation officer repeatedly " did he order them..did  he?". She speaks with this Probation officer about being " transferred back to [facility]...where my doctors are" and Probation officer discussed ECT with possible transfer of pt to [place], [facility] or [place]. [place], per Dr Karmen Stabs request relayed poc change ( ECT) to [facility] ([facility]).    A Pt is compliant with her meds, asks appropriate questions regardign dosages and completes her daily assessment and on this she wrote she denied SI and she rated her depression, hopelessness and anxiety " 10/8/8/", respectively. She has been seen out in the milieu and is offered positive encouragement by this Probation officer.    R Safety is in place.

## 2017-11-05 MED ORDER — VORTIOXETINE HBR 5 MG PO TABS
5.0000 mg | ORAL_TABLET | Freq: Every day | ORAL | Status: DC
  Administered 2017-11-05 – 2017-11-06 (×2): 5 mg via ORAL
  Filled 2017-11-05 (×3): qty 1

## 2017-11-05 MED ORDER — DICLOFENAC SODIUM 1 % TD GEL
2.0000 g | Freq: Four times a day (QID) | TRANSDERMAL | Status: DC
  Administered 2017-11-05 – 2017-11-10 (×15): 2 g via TOPICAL
  Filled 2017-11-05: qty 100

## 2017-11-05 MED ORDER — DULOXETINE HCL 60 MG PO CPEP
60.0000 mg | ORAL_CAPSULE | Freq: Every day | ORAL | Status: DC
  Administered 2017-11-06: 60 mg via ORAL
  Filled 2017-11-05 (×2): qty 1

## 2017-11-05 MED ORDER — RISAQUAD PO CAPS
1.0000 | ORAL_CAPSULE | Freq: Every day | ORAL | Status: DC
  Administered 2017-11-05 – 2017-11-16 (×12): 1 via ORAL
  Filled 2017-11-05 (×14): qty 1

## 2017-11-05 NOTE — [facility] Group Notes (Signed)
[facility] LCSW Group Therapy Note  Date/Time:  11/05/2017 10:00-11:00  Type of Therapy and Topic:  Group Therapy:  Healthy and Unhealthy Supports  Participation Level:  Did Not Attend   Description of Group:  Patients in this group were introduced to the idea of adding a variety of healthy supports to address the various needs in their lives.Patients discussed what additional healthy supports could be helpful in their recovery and wellness after discharge in order to prevent future hospitalizations.   An emphasis was placed on using counselor, doctor, therapy groups, 12-step groups, and problem-specific support groups to expand supports.  They also worked as a group on developing a specific plan for several patients to deal with unhealthy supports through [place], psychoeducation with loved ones, and even termination of relationships.   Therapeutic Goals:   1)  discuss importance of adding supports to stay well once out of the hospital  2)  compare healthy versus unhealthy supports and identify some examples of each  3)  generate ideas and descriptions of healthy supports that can be added  4)  offer mutual support about how to address unhealthy supports  5)  encourage active participation in and adherence to discharge plan    Summary of Patient Progress:   Did not attend   Therapeutic Modalities:   Motivational Interviewing Brief Solution-Focused Therapy  Rolanda Jay

## 2017-11-05 NOTE — Progress Notes (Signed)
D Pt is observed [facility]AL on the 400 hall today. She is more depressed, pale and drawn-appearing. Her affect is emotionless, she is acutely depressed and demonstrates this by the slow, monotone voice she uses when she speaks- as well as the slow monotonous way she ambulates and gets around.     A She says " not really" in response to this nurse's question " do you feel any better today?" She completed her daily assessment and on this she wrote she has had feelings of SI today, she contracs with this writer to not act on these thougths. She rates her depression, hopelessness and anxiety " 1/8/8/", respectively. She complained of forearm pain in her left wrist and says " I broke my wrist right before I cam into the hospital... I fell". Per Dr. Karmen Stabs instructions, pt was fitted with a velcro left forearm stabilizer and she reports " this has helped a lot..idiopathic thrombocytopenic purpura keeps me from moving my arm". She also shared that prior to her admission, she had seena neurologist, who had diagnosed her with degenerative joint disease in her right shoulder" its really hurtin today" and pt was given voltaren cream, that she rubbed on her right shoulder. Pt reproted immediate pain relief " it doesn't go away..idiopathic thrombocytopenic purpura just makes the pain less intense". She remians " in limbo"- waiting for ECT.     R Safety is in place.

## 2017-11-05 NOTE — Plan of Care (Signed)
  Problem: Education: Goal: Emotional status will improve Outcome: Not Progressing   

## 2017-11-05 NOTE — [facility] Group Notes (Signed)
[facility] Group Notes:  (Nursing/MHT/Case Management/Adjunct)  Date:  11/05/2017  Time:  1:48 PM  Type of Therapy:  Psychoeducational Skills  Participation Level:  Did Not Attend  Participation Quality:  Did not attend  Affect:  Did not attend  Cognitive:  Did not attend  Insight:  None  Engagement in Group:  Did not attend  Modes of Intervention:  Did not attend  Summary of Progress/Problems: Pt did not attend Psychoeducational group with topic healthy support systems.   [name] 11/05/2017, 1:48 PM

## 2017-11-05 NOTE — Progress Notes (Addendum)
D: Pt was in bed in her room upon initial approach.  Pt presents with anxious, depressed affect and mood.  Describes her day as "okay" and reports goal is "nothing."  Writer and pt developed goal for pt to "be safe and sleep well."  Pt denies HI, denies hallucinations, reports R shoulder pain of 8/10 and L wrist pain of 2/10.  Pt has been isolative to her room for majority of the night and she did not attend evening group.    A: Introduced self to pt.  Actively listened to pt and offered support and encouragement. Medications administered per order.  PRN medication administered for pain and anxiety.  Heat packs and ice pack provided for pain.  Pt was encouraged to use brace for L wrist.  She reports it is in her room.  Q15 minute safety checks maintained.  R: Pt is safe on the unit.  Pt is compliant with medications.  Pt verbally contracts for safety.  Will continue to monitor and assess.

## 2017-11-05 NOTE — Plan of Care (Signed)
  Problem: Education: Goal: Emotional status will improve 11/05/2017 1812 by Sharma Covert, RN Outcome: Not Progressing 11/05/2017 1613 by Sharma Covert, RN Outcome: Not Progressing

## 2017-11-05 NOTE — Progress Notes (Signed)
[facility] MD Progress Note  11/05/2017 11:14 AM Crystal Duarte  MRN:  [id number] Subjective: Patient is seen and examined.  Patient is a 57 year old female with a past psychiatric history significant for severe major depression, recurrent without psychotic features.  She is seen in follow-up.  Does not look as though there is been any progress on trying to get her transfer to [facility] for ECT.  Dr. Weber Cooks should be back in the clinic tomorrow, and hopefully will be able to schedule a consultation with him.  She remains significantly depressed.  She is more grossly depressed.  She is still very suicidal.  She has been on multiple medications in the past.  This includes Abilify, Wellbutrin and other augmentation agents.  She has been on multiple SSRIs as well as SNRIs.  We discussed the possibility of weaning her off the Cymbalta and going with Trintellix.  I discussed the possibility of decreasing her dosage, and starting the Trintellix today.  We discussed the possibility of GI side effects, and serotonin syndrome like side effects.  She continues on the Augmentin, and is having some GI problems with it.  I told her we would start some lactobacillus for that.  She did state that she slept better last night with the 200 to trazodone. Principal Problem: <principal problem not specified> Diagnosis:   Patient Active Problem List   Diagnosis Date Noted  . Severe episode of recurrent major depressive disorder, without psychotic features ([place]) [F33.2] 07/18/2017  . DOE (dyspnea on exertion) [R06.09] 12/26/2016  . COPD GOLD II  [J44.9] 12/26/2016  . Orthostatic hypotension [I95.1] 12/14/2016  . Chest tightness [R07.89] 12/14/2016  . Weight loss [R63.4] 12/14/2016  . Routine general medical examination at a health care facility [Z00.00] 10/09/2015  . 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 Hx of adenomatous polyp of colon [Z86.010] 12/04/2014  . Memory loss [R41.3] 10/04/2014  . Affective bipolar  disorder ([place]) [F31.9] 03/12/2014  . Peripheral vascular disease, unspecified ([place]) [I73.9] 10/10/2013  . Smoker [F17.200] 08/28/2012  . Menopause [Z78.0] 08/28/2012  . Multiple pulmonary nodules [R91.8] 11/12/2010   Total Time spent with patient: 20 minutes  Past Psychiatric History: See admission H&P  Past Medical History:  Past Medical History:  Diagnosis Date  . Coccidioidomycosis, pulmonary ([place])   . Depression   . Hx of adenomatous polyp of colon 12/04/2014  . Osteopenia 11/2016   T score -2.2 FRAX 17%/2.4%  . Panic attack     Past Surgical History:  Procedure Laterality Date  . ABDOMINAL HYSTERECTOMY    . COLONOSCOPY  2001   hemorrhoidectomy  . LUNG SURGERY  2001   VATS surgery   . OVARIAN CYST REMOVAL  1970's  . VESICOVAGINAL FISTULA CLOSURE W/ TAH  2005   Family History:  Family History  Problem Relation Age of Onset  . Asthma Mother   . Ovarian cancer Mother   . Cancer Mother        OVARIAN  . Varicose Veins Mother   . Heart disease Father   . Peripheral vascular disease Father   . Cancer Maternal Grandmother        LUNG- LUNG  . Colon cancer Neg Hx    Family Psychiatric  History: See admission H&P Social History:  Social History   Substance and Sexual Activity  Alcohol Use No  . Alcohol/week: 0.0 oz     Social History   Substance and Sexual Activity  Drug Use No    Social History  Socioeconomic History  . Marital status: Divorced    Spouse name: Not on file  . Number of children: 2  . Years of education: Not on file  . Highest education level: Not on file  Occupational History  . Occupation: Forensic psychologist: [employer]  . Financial resource strain: Not on file  . Food insecurity:    Worry: Not on file    Inability: Not on file  . Transportation needs:    Medical: Not on file    Non-medical: Not on file  Tobacco Use  . Smoking status: Current Some Day Smoker    Packs/day: 1.00    Years: 42.00     Pack years: 42.00    Types: Cigarettes    Last attempt to quit: 09/25/2016    Years since quitting: 1.1  . Smokeless tobacco: Former Systems developer    Quit date: 06/13/2016  Substance and Sexual Activity  . Alcohol use: No    Alcohol/week: 0.0 oz  . Drug use: No  . Sexual activity: Not on file  Lifestyle  . Physical activity:    Days per week: Not on file    Minutes per session: Not on file  . Stress: Not on file  Relationships  . Social connections:    Talks on phone: Not on file    Gets together: Not on file    Attends religious service: Not on file    Active member of club or organization: Not on file    Attends meetings of clubs or organizations: Not on file    Relationship status: Not on file  Other Topics Concern  . Not on file  Social History Narrative  . Not on file   Additional Social History:                         Sleep: Fair  Appetite:  Fair  Current Medications: Current Facility-Administered Medications  Medication Dose Route Frequency Provider Last Rate Last Dose  . acetaminophen (TYLENOL) tablet 650 mg  650 mg Oral Q6H PRN Ethelene Hal, NP      . alum & mag hydroxide-simeth (MAALOX/MYLANTA) 200-200-20 MG/5ML suspension 30 mL  30 mL Oral Q4H PRN Ethelene Hal, NP      . amoxicillin-clavulanate (AUGMENTIN) 875-125 MG per tablet 1 tablet  1 tablet Oral Q12H Ethelene Hal, NP   1 tablet at 11/05/17 1102  . benzonatate (TESSALON) capsule 100 mg  100 mg Oral TID Ethelene Hal, NP   100 mg at 11/05/17 1100  . busPIRone (BUSPAR) tablet 15 mg  15 mg Oral TID Sharma Covert, MD   15 mg at 11/05/17 1100  . dextromethorphan-guaiFENesin (MUCINEX DM) 30-600 MG per 12 hr tablet 1 tablet  1 tablet Oral Q12H Green, Terri L, RPH   1 tablet at 11/05/17 1100  . DULoxetine (CYMBALTA) DR capsule 90 mg  90 mg Oral Daily Sharma Covert, MD   90 mg at 11/05/17 1059  . gabapentin (NEURONTIN) capsule 400 mg  400 mg Oral TID Minda Ditto, RPH    400 mg at 11/05/17 1100  . hydrOXYzine (ATARAX/VISTARIL) tablet 25 mg  25 mg Oral TID PRN Ethelene Hal, NP   25 mg at 11/04/17 2112  . magnesium hydroxide (MILK OF MAGNESIA) suspension 30 mL  30 mL Oral Daily PRN Ethelene Hal, NP      . pneumococcal 23 valent vaccine (PNU-IMMUNE)  injection 0.5 mL  0.5 mL Intramuscular Tomorrow-1000 Sharma Covert, MD      . propranolol (INDERAL) tablet 20 mg  20 mg Oral TID Sharma Covert, MD   20 mg at 11/05/17 1101  . traZODone (DESYREL) tablet 200 mg  200 mg Oral QHS Sharma Covert, MD   200 mg at 11/04/17 2112    Lab Results: No results found for this or any previous visit (from the past 43 hour(s)).  Blood Alcohol level:  Lab Results  Component Value Date   ETH <10 11/02/2017   ETH <10 15/17/6160    Metabolic Disorder Labs: Lab Results  Component Value Date   HGBA1C 5.4 07/21/2017   MPG 108.28 07/21/2017   MPG 114 08/28/2012   No results found for: PROLACTIN Lab Results  Component Value Date   CHOL 200 07/21/2017   TRIG 208 (H) 07/21/2017   HDL 54 07/21/2017   CHOLHDL 3.7 07/21/2017   VLDL 42 (H) 07/21/2017   LDLCALC 104 (H) 07/21/2017   LDLCALC 119 (H) 10/08/2015    Physical Findings: AIMS:  , ,  ,  ,    CIWA:    COWS:     Musculoskeletal: Strength & Muscle Tone: within normal limits Gait & Station: normal Patient leans: N/A  Psychiatric Specialty Exam: Physical Exam  Nursing note and vitals reviewed. Constitutional: She is oriented to person, place, and time. She appears well-developed and well-nourished.  HENT:  Head: Normocephalic and atraumatic.  Respiratory: Effort normal.  Neurological: She is alert and oriented to person, place, and time.    ROS  Blood pressure 112/76, pulse (!) 112, temperature 99.4 F (37.4 C), temperature source Oral, resp. rate 18, height 5\' 4"  (1.626 m), weight 76.7 kg (169 lb), SpO2 97 %.Body mass index is 29.01 kg/m.  General Appearance: Casual  Eye  Contact:  Minimal  Speech:  Slow  Volume:  Decreased  Mood:  Depressed  Affect:  Congruent  Thought Process:  Coherent  Orientation:  Full (Time, Place, and Person)  Thought Content:  Logical  Suicidal Thoughts:  Yes.  with intent/plan  Homicidal Thoughts:  No  Memory:  Immediate;   Fair Recent;   Fair Remote;   Fair  Judgement:  Impaired  Insight:  Fair  Psychomotor Activity:  Psychomotor Retardation  Concentration:  Concentration: Fair and Attention Span: Fair  Recall:  [employer] of Knowledge:  Fair  Language:  Fair  Akathisia:  Negative  Handed:  Right  AIMS (if indicated):     Assets:  Desire for Improvement Financial Resources/Insurance Housing Physical Health Resilience Social Support  ADL's:  Intact  Cognition:  WNL  Sleep:  Number of Hours: 6.75     Treatment Plan Summary: Daily contact with patient to assess and evaluate symptoms and progress in treatment, Medication management and Plan : Patient is seen and examined.  Patient is a 57 year old female with the above-stated past psychiatric history seen in follow-up.  She remains severely depressed..  I still believe the best course of action is ECT.  But I also believe we need to start trying to do something for her.  I am going to start weaning her Cymbalta.  I meant to decrease her dose to 60 mg a day today, down to 30 mg a day x1 day tomorrow, and then hopefully stop it as long as her discontinuation issues are no problem.  I am going to go on and start Trintellix 5 mg p.o. daily today.  We have discussed the possibility of GI side effects including serotonin syndrome for her to be aware of.  She will notify staff if any of that occurs.  I am going to discuss with social work and nursing about contacting [facility] as well as [facility] again in an attempt to transfer her for ECT, but we will also place a consult in for Dr. Weber Cooks, and hopefully he will be able to see her tomorrow.  The rest of  her medical issues are currently stable at this point.  Sharma Covert, MD 11/05/2017, 11:14 AM

## 2017-11-06 MED ORDER — VORTIOXETINE HBR 5 MG PO TABS
5.0000 mg | ORAL_TABLET | Freq: Once | ORAL | Status: AC
  Administered 2017-11-06: 5 mg via ORAL
  Filled 2017-11-06: qty 1

## 2017-11-06 MED ORDER — DULOXETINE HCL 30 MG PO CPEP
30.0000 mg | ORAL_CAPSULE | Freq: Every day | ORAL | Status: AC
  Administered 2017-11-07: 30 mg via ORAL
  Filled 2017-11-06: qty 1

## 2017-11-06 MED ORDER — VORTIOXETINE HBR 10 MG PO TABS
10.0000 mg | ORAL_TABLET | Freq: Every day | ORAL | Status: DC
  Administered 2017-11-07 – 2017-11-11 (×5): 10 mg via ORAL
  Filled 2017-11-06 (×6): qty 1

## 2017-11-06 NOTE — Progress Notes (Signed)
Pt presents with a flat affect and depressed mood. Pt reported increased depression and anxiety on approach. Pt requested vistaril for increased anxiety level. Vistaril administered to pt at her request. Pt expressed to writer that she's always depressed and that she's waiting to get ECT treatments. Pt denies SI/HI. Pt reported fair sleep last night. Pt compliant with taking meds and denies any side effects.  Medications and orders reviewed with pt. Verbal support provided. Pt encouraged to attend groups. 15 minute checks performed for safety.   Pt compliant with tx plan.

## 2017-11-06 NOTE — [facility] Group Notes (Signed)
[facility] LCSW Group Therapy Note  Date/Time:11/06/17, 1315  Type of Therapy and Topic:  Group Therapy:  Overcoming Obstacles  Participation Level:  Did not attend  Description of Group:    In this group patients will be encouraged to explore what they see as obstacles to their own wellness and recovery. They will be guided to discuss their thoughts, feelings, and behaviors related to these obstacles. The group will process together ways to cope with barriers, with attention given to specific choices patients can make. Each patient will be challenged to identify changes they are motivated to make in order to overcome their obstacles. This group will be process-oriented, with patients participating in exploration of their own experiences as well as giving and receiving support and challenge from other group members.  Therapeutic Goals: 1. Patient will identify personal and current obstacles as they relate to admission. 2. Patient will identify barriers that currently interfere with their wellness or overcoming obstacles.  3. Patient will identify feelings, thought process and behaviors related to these barriers. 4. Patient will identify two changes they are willing to make to overcome these obstacles:    Summary of Patient Progress      Therapeutic Modalities:   Cognitive Behavioral Therapy Solution Focused Therapy Motivational Interviewing Relapse Prevention Therapy  Lurline Idol, LCSW

## 2017-11-06 NOTE — Progress Notes (Signed)
Recreation Therapy Notes  Date: 8.5.19 Time: 0930 Location: 300 Hall Dayroom  Group Topic: Stress Management  Goal Area(s) Addresses:  Patient will verbalize importance of using healthy stress management.  Patient will identify positive emotions associated with healthy stress management.   Intervention: Stress Management  Activity : Guided Imagery.  LRT introduced the stress management technique of guided imagery.  LRT read a script to lead patients on a journey through a meadow.  Patients were to follow along as script was read.  Education: Stress Management, Discharge Planning.   Education Outcome: Acknowledges edcuation/In group clarification offered/Needs additional education  Clinical Observations/Feedback: Pt did not attend group.    Victorino Sparrow, LRT/CTRS         Victorino Sparrow A 11/06/2017 11:32 AM

## 2017-11-06 NOTE — Progress Notes (Addendum)
[facility] MD Progress Note  11/06/2017 10:01 AM Crystal Duarte  MRN:  [id number] Subjective: Patient is seen and examined.  Patient is a 57 year old female with a past psychiatric history significant for severe major depression; recurrent without psychotic features.  She is seen in follow-up.  So far no real improvement in her psychiatric situation.  We still continue to attempt for transfer for ECT.  Unfortunately Dr. Weber Cooks is out of the office this week and will not be able to do ECT at [facility].  I have called [facility] and left a message at her psychiatrist office, and also called the transfer coordinator.  The report at [facility] was they had no beds available at this point.  The patient stated she would call her son who would call her doctor's assistant, and see if we could put things a little bit more forward.  Her depression is essentially unchanged.  She remains most the time in her room isolated.  She reported no problems with the combination of the Trintellix with the Cymbalta, and we will continue to decrease her Cymbalta dosage today.  No other changes in her medications are planned. Principal Problem: <principal problem not specified> Diagnosis:   Patient Active Problem List   Diagnosis Date Noted  . Severe episode of recurrent major depressive disorder, without psychotic features ([place]) [F33.2] 07/18/2017  . DOE (dyspnea on exertion) [R06.09] 12/26/2016  . COPD GOLD II  [J44.9] 12/26/2016  . Orthostatic hypotension [I95.1] 12/14/2016  . Chest tightness [R07.89] 12/14/2016  . Weight loss [R63.4] 12/14/2016  . Routine general medical examination at a health care facility [Z00.00] 10/09/2015  . 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 Hx of adenomatous polyp of colon [Z86.010] 12/04/2014  . Memory loss [R41.3] 10/04/2014  . Affective bipolar disorder ([place]) [F31.9] 03/12/2014  . Peripheral vascular disease, unspecified ([place]) [I73.9] 10/10/2013  . Smoker [F17.200]  08/28/2012  . Menopause [Z78.0] 08/28/2012  . Multiple pulmonary nodules [R91.8] 11/12/2010   Total Time spent with patient: 20 minutes  Past Psychiatric History: See admission H&P  Past Medical History:  Past Medical History:  Diagnosis Date  . Coccidioidomycosis, pulmonary ([place])   . Depression   . Hx of adenomatous polyp of colon 12/04/2014  . Osteopenia 11/2016   T score -2.2 FRAX 17%/2.4%  . Panic attack     Past Surgical History:  Procedure Laterality Date  . ABDOMINAL HYSTERECTOMY    . COLONOSCOPY  2001   hemorrhoidectomy  . LUNG SURGERY  2001   VATS surgery   . OVARIAN CYST REMOVAL  1970's  . VESICOVAGINAL FISTULA CLOSURE W/ TAH  2005   Family History:  Family History  Problem Relation Age of Onset  . Asthma Mother   . Ovarian cancer Mother   . Cancer Mother        OVARIAN  . Varicose Veins Mother   . Heart disease Father   . Peripheral vascular disease Father   . Cancer Maternal Grandmother        LUNG- LUNG  . Colon cancer Neg Hx    Family Psychiatric  History: See admission H&P Social History:  Social History   Substance and Sexual Activity  Alcohol Use No  . Alcohol/week: 0.0 oz     Social History   Substance and Sexual Activity  Drug Use No    Social History   Socioeconomic History  . Marital status: Divorced    Spouse name: Not on file  . Number of children: 2  .  Years of education: Not on file  . Highest education level: Not on file  Occupational History  . Occupation: Forensic psychologist: [employer]  . Financial resource strain: Not on file  . Food insecurity:    Worry: Not on file    Inability: Not on file  . Transportation needs:    Medical: Not on file    Non-medical: Not on file  Tobacco Use  . Smoking status: Current Some Day Smoker    Packs/day: 1.00    Years: 42.00    Pack years: 42.00    Types: Cigarettes    Last attempt to quit: 09/25/2016    Years since quitting: 1.1  . Smokeless tobacco:  Former Systems developer    Quit date: 06/13/2016  Substance and Sexual Activity  . Alcohol use: No    Alcohol/week: 0.0 oz  . Drug use: No  . Sexual activity: Not on file  Lifestyle  . Physical activity:    Days per week: Not on file    Minutes per session: Not on file  . Stress: Not on file  Relationships  . Social connections:    Talks on phone: Not on file    Gets together: Not on file    Attends religious service: Not on file    Active member of club or organization: Not on file    Attends meetings of clubs or organizations: Not on file    Relationship status: Not on file  Other Topics Concern  . Not on file  Social History Narrative  . Not on file   Additional Social History:                         Sleep: Fair  Appetite:  Fair  Current Medications: Current Facility-Administered Medications  Medication Dose Route Frequency Provider Last Rate Last Dose  . acetaminophen (TYLENOL) tablet 650 mg  650 mg Oral Q6H PRN Ethelene Hal, NP   650 mg at 11/06/17 0814  . acidophilus (RISAQUAD) capsule 1 capsule  1 capsule Oral Daily Sharma Covert, MD   1 capsule at 11/06/17 7564  . alum & mag hydroxide-simeth (MAALOX/MYLANTA) 200-200-20 MG/5ML suspension 30 mL  30 mL Oral Q4H PRN Ethelene Hal, NP      . amoxicillin-clavulanate (AUGMENTIN) 875-125 MG per tablet 1 tablet  1 tablet Oral Q12H Ethelene Hal, NP   1 tablet at 11/06/17 [phone]  . benzonatate (TESSALON) capsule 100 mg  100 mg Oral TID Ethelene Hal, NP   100 mg at 11/06/17 0809  . busPIRone (BUSPAR) tablet 15 mg  15 mg Oral TID Sharma Covert, MD   15 mg at 11/06/17 0809  . dextromethorphan-guaiFENesin (MUCINEX DM) 30-600 MG per 12 hr tablet 1 tablet  1 tablet Oral Q12H Minda Ditto, RPH   1 tablet at 11/06/17 0809  . diclofenac sodium (VOLTAREN) 1 % transdermal gel 2 g  2 g Topical QID Sharma Covert, MD   2 g at 11/06/17 [phone]  . DULoxetine (CYMBALTA) DR capsule 60 mg  60 mg Oral  Daily Sharma Covert, MD   60 mg at 11/06/17 0809  . gabapentin (NEURONTIN) capsule 400 mg  400 mg Oral TID Minda Ditto, RPH   400 mg at 11/06/17 0809  . hydrOXYzine (ATARAX/VISTARIL) tablet 25 mg  25 mg Oral TID PRN Ethelene Hal, NP   25 mg at 11/06/17 0814  .  magnesium hydroxide (MILK OF MAGNESIA) suspension 30 mL  30 mL Oral Daily PRN Ethelene Hal, NP      . pneumococcal 23 valent vaccine (PNU-IMMUNE) injection 0.5 mL  0.5 mL Intramuscular Tomorrow-1000 Sharma Covert, MD      . propranolol (INDERAL) tablet 20 mg  20 mg Oral TID Sharma Covert, MD   20 mg at 11/06/17 7342  . traZODone (DESYREL) tablet 200 mg  200 mg Oral QHS Sharma Covert, MD   200 mg at 11/05/17 2113  . vortioxetine HBr (TRINTELLIX) tablet 5 mg  5 mg Oral Daily Sharma Covert, MD   5 mg at 11/06/17 8768    Lab Results: No results found for this or any previous visit (from the past 22 hour(s)).  Blood Alcohol level:  Lab Results  Component Value Date   ETH <10 11/02/2017   ETH <10 11/57/2620    Metabolic Disorder Labs: Lab Results  Component Value Date   HGBA1C 5.4 07/21/2017   MPG 108.28 07/21/2017   MPG 114 08/28/2012   No results found for: PROLACTIN Lab Results  Component Value Date   CHOL 200 07/21/2017   TRIG 208 (H) 07/21/2017   HDL 54 07/21/2017   CHOLHDL 3.7 07/21/2017   VLDL 42 (H) 07/21/2017   LDLCALC 104 (H) 07/21/2017   LDLCALC 119 (H) 10/08/2015    Physical Findings: AIMS:  , ,  ,  ,    CIWA:    COWS:     Musculoskeletal: Strength & Muscle Tone: within normal limits Gait & Station: normal Patient leans: N/A  Psychiatric Specialty Exam: Physical Exam  Nursing note and vitals reviewed. Constitutional: She is oriented to person, place, and time. She appears well-developed and well-nourished.  HENT:  Head: Normocephalic and atraumatic.  Respiratory: Effort normal.  Neurological: She is alert and oriented to person, place, and time.    ROS   Blood pressure 104/71, pulse 99, temperature 98.9 F (37.2 C), temperature source Oral, resp. rate (!) 24, height 5\' 4"  (1.626 m), weight 76.7 kg (169 lb), SpO2 97 %.Body mass index is 29.01 kg/m.  General Appearance: Casual  Eye Contact:  Fair  Speech:  Slow  Volume:  Decreased  Mood:  Depressed  Affect:  Congruent  Thought Process:  Coherent  Orientation:  Full (Time, Place, and Person)  Thought Content:  Logical  Suicidal Thoughts:  Yes.  with intent/plan  Homicidal Thoughts:  No  Memory:  Immediate;   Fair Recent;   Fair Remote;   Fair  Judgement:  Impaired  Insight:  Fair  Psychomotor Activity:  Decreased and Psychomotor Retardation  Concentration:  Concentration: Fair and Attention Span: Fair  Recall:  [employer] of Knowledge:  Fair  Language:  Fair  Akathisia:  Negative  Handed:  Right  AIMS (if indicated):     Assets:  Desire for Improvement Financial Resources/Insurance Housing Resilience Social Support Talents/Skills  ADL's:  Intact  Cognition:  WNL  Sleep:  Number of Hours: 6.25     Treatment Plan Summary: Daily contact with patient to assess and evaluate symptoms and progress in treatment, Medication management and Plan : Patient is seen and examined.  Patient is a 57 year old female with the above-stated past psychiatric history seen in follow-up.  She remains unchanged.  We are still attempting to transfer to get her ECT.  I will continue her wean off the Cymbalta.  We will reduce the dosage to 30 mg p.o. daily today.  I  will continue the Trintellix at 5 mg p.o. daily.  No change in her BuSpar, gabapentin or hydroxyzine at this point.  Sharma Covert, MD 11/06/2017, 10:01 AM   Addendum  I contacted [facility] after 12 when they said they would know whether or not they had any beds available.  They are still reportedly at capacity.  They stated to call again in the a.m. to see about transfer.  Given the possibility of delay in the  transfer I am going to go on and increase the Trintellix to 10 mg p.o. daily and stop the Cymbalta.  No other changes in her medications.

## 2017-11-06 NOTE — [facility] Suicide Risk Assessment (Signed)
[place] INPATIENT:  Family/Significant Other Suicide Prevention Education  Suicide Prevention Education:  Education Completed; Carleene Mains, son, [phone], has been identified by the patient as the family member/significant other with whom the patient will be residing, and identified as the person(s) who will aid the patient in the event of a mental health crisis (suicidal ideations/suicide attempt).  With written consent from the patient, the family member/significant other has been provided the following suicide prevention education, prior to the and/or following the discharge of the patient.  The suicide prevention education provided includes the following:  Suicide risk factors  Suicide prevention and interventions  National Suicide Hotline telephone number  [facility] assessment telephone number  [facility] [place] and/or Residential Mobile Crisis Unit telephone number  Request made of family/significant other to:  Remove weapons (e.g., guns, rifles, knives), all items previously/currently identified as safety concern.  No guns in the home, per son.  Remove drugs/medications (over-the-counter, prescriptions, illicit drugs), all items previously/currently identified as a safety concern.  The family member/significant other verbalizes understanding of the suicide prevention education information provided.  The family member/significant other agrees to remove the items of safety concern listed above.  Per son, pt has several stressors including end of alimony in Feb 2020 and her living situation that may be contributing to her current depression.  Multiple prior hospitalizations and ECT treatments.  Son feels like she is worse this time and is concerned with the planning she had put into this potential suicide attempt.  He has spoken to her and she is "not herself" right now.  Joanne Chars, LCSW 11/06/2017, 3:39 PM

## 2017-11-06 NOTE — Tx Team (Signed)
Interdisciplinary Treatment and Diagnostic Plan Update  11/06/2017 Time of Session: 10:30am Crystal Duarte MRN: [id number]  Principal Diagnosis: <principal problem not specified>  Secondary Diagnoses: Active Problems:   Severe episode of recurrent major depressive disorder, without psychotic features ([place])   Current Medications:  Current Facility-Administered Medications  Medication Dose Route Frequency Provider Last Rate Last Dose  . acetaminophen (TYLENOL) tablet 650 mg  650 mg Oral Q6H PRN Ethelene Hal, NP   650 mg at 11/06/17 0814  . acidophilus (RISAQUAD) capsule 1 capsule  1 capsule Oral Daily Sharma Covert, MD   1 capsule at 11/06/17 9211  . alum & mag hydroxide-simeth (MAALOX/MYLANTA) 200-200-20 MG/5ML suspension 30 mL  30 mL Oral Q4H PRN Ethelene Hal, NP      . amoxicillin-clavulanate (AUGMENTIN) 875-125 MG per tablet 1 tablet  1 tablet Oral Q12H Ethelene Hal, NP   1 tablet at 11/06/17 [phone]  . benzonatate (TESSALON) capsule 100 mg  100 mg Oral TID Ethelene Hal, NP   100 mg at 11/06/17 0809  . busPIRone (BUSPAR) tablet 15 mg  15 mg Oral TID Sharma Covert, MD   15 mg at 11/06/17 0809  . dextromethorphan-guaiFENesin (MUCINEX DM) 30-600 MG per 12 hr tablet 1 tablet  1 tablet Oral Q12H Minda Ditto, RPH   1 tablet at 11/06/17 0809  . diclofenac sodium (VOLTAREN) 1 % transdermal gel 2 g  2 g Topical QID Sharma Covert, MD   2 g at 11/06/17 [phone]  . DULoxetine (CYMBALTA) DR capsule 60 mg  60 mg Oral Daily Sharma Covert, MD   60 mg at 11/06/17 0809  . gabapentin (NEURONTIN) capsule 400 mg  400 mg Oral TID Minda Ditto, RPH   400 mg at 11/06/17 0809  . hydrOXYzine (ATARAX/VISTARIL) tablet 25 mg  25 mg Oral TID PRN Ethelene Hal, NP   25 mg at 11/06/17 0814  . magnesium hydroxide (MILK OF MAGNESIA) suspension 30 mL  30 mL Oral Daily PRN Ethelene Hal, NP      . pneumococcal 23 valent vaccine (PNU-IMMUNE) injection 0.5 mL   0.5 mL Intramuscular Tomorrow-1000 Sharma Covert, MD      . propranolol (INDERAL) tablet 20 mg  20 mg Oral TID Sharma Covert, MD   20 mg at 11/06/17 4481  . traZODone (DESYREL) tablet 200 mg  200 mg Oral QHS Sharma Covert, MD   200 mg at 11/05/17 2113  . vortioxetine HBr (TRINTELLIX) tablet 5 mg  5 mg Oral Daily Sharma Covert, MD   5 mg at 11/06/17 0809   PTA Medications: Medications Prior to Admission  Medication Sig Dispense Refill Last Dose  . acetaminophen (TYLENOL) 500 MG tablet Take 2 tablets (1,000 mg total) by mouth every 6 (six) hours as needed. 30 tablet 0 Past Week at Unknown time  . albuterol (PROVENTIL HFA;VENTOLIN HFA) 108 (90 Base) MCG/ACT inhaler Inhale 2 puffs into the lungs every 4 (four) hours as needed for wheezing or shortness of breath. 1 Inhaler 0 Past Week at Unknown time  . busPIRone (BUSPAR) 15 MG tablet Take 15 mg by mouth 3 (three) times daily.   Past Week at Unknown time  . DULoxetine (CYMBALTA) 60 MG capsule Take 1 capsule (60 mg total) by mouth 2 (two) times daily. 60 capsule 0 Past Week at Unknown time  . fluticasone (FLONASE) 50 MCG/ACT nasal spray Place 2 sprays into both nostrils daily. (Patient not taking: Reported on  11/02/2017) 16 g 2 Not Taking at Unknown time  . gabapentin (NEURONTIN) 400 MG capsule Take 1 capsule (400 mg total) by mouth 3 (three) times daily. (Patient not taking: Reported on 11/02/2017) 90 capsule 0 Not Taking at Unknown time  . naproxen (NAPROSYN) 500 MG tablet Take 1 tablet (500 mg total) by mouth 2 (two) times daily. (Patient not taking: Reported on 11/02/2017) 30 tablet 0 Not Taking at Unknown time  . Naproxen Sod-diphenhydrAMINE (ALEVE PM PO) Take 15 tablets by mouth once.   Past Week at Unknown time  . orphenadrine (NORFLEX) 100 MG tablet Take 1 tablet (100 mg total) by mouth 2 (two) times daily. (Patient not taking: Reported on 11/02/2017) 30 tablet 0 Not Taking at Unknown time  . oxyCODONE-acetaminophen (PERCOCET/ROXICET)  5-325 MG tablet Take 1 tablet by mouth every 6 (six) hours as needed for severe pain. (Patient not taking: Reported on 11/02/2017) 12 tablet 0 Not Taking at Unknown time  . propranolol (INDERAL) 20 MG tablet Take 20 mg by mouth 3 (three) times daily.   Past Week at Unknown time  . QUEtiapine (SEROQUEL) 300 MG tablet Take 1 tablet (300 mg total) by mouth at bedtime. (Patient not taking: Reported on 11/02/2017) 30 tablet 0 Not Taking at Unknown time  . traMADol (ULTRAM) 50 MG tablet 2 tablets every 6 hours may be taken in combination with acetaminophen and naproxen. (Patient not taking: Reported on 11/02/2017) 20 tablet 0 Not Taking at Unknown time  . traZODone (DESYREL) 100 MG tablet Take 1 tablet (100 mg total) by mouth at bedtime as needed and may repeat dose one time if needed for sleep. 30 tablet 0 11/01/2017 at Unknown time    Patient Stressors: Financial difficulties Health problems  Patient Strengths: Ability for insight Average or above average intelligence Capable of independent living Communication skills Supportive family/friends  Treatment Modalities: Medication Management, Group therapy, Case management,  1 to 1 session with clinician, Psychoeducation, Recreational therapy.   Physician Treatment Plan for Primary Diagnosis: <principal problem not specified> Long Term Goal(s): Improvement in symptoms so as ready for discharge Improvement in symptoms so as ready for discharge   Short Term Goals: Ability to identify changes in lifestyle to reduce recurrence of condition will improve Ability to verbalize feelings will improve Ability to disclose and discuss suicidal ideas Ability to demonstrate self-control will improve Ability to identify and develop effective coping behaviors will improve Ability to maintain clinical measurements within normal limits will improve Ability to identify changes in lifestyle to reduce recurrence of condition will improve Ability to verbalize feelings will  improve Ability to disclose and discuss suicidal ideas Ability to demonstrate self-control will improve Ability to identify and develop effective coping behaviors will improve  Medication Management: Evaluate patient's response, side effects, and tolerance of medication regimen.  Therapeutic Interventions: 1 to 1 sessions, Unit Group sessions and Medication administration.  Evaluation of Outcomes: Not Met  Physician Treatment Plan for Secondary Diagnosis: Active Problems:   Severe episode of recurrent major depressive disorder, without psychotic features ([place])  Long Term Goal(s): Improvement in symptoms so as ready for discharge Improvement in symptoms so as ready for discharge   Short Term Goals: Ability to identify changes in lifestyle to reduce recurrence of condition will improve Ability to verbalize feelings will improve Ability to disclose and discuss suicidal ideas Ability to demonstrate self-control will improve Ability to identify and develop effective coping behaviors will improve Ability to maintain clinical measurements within normal limits will improve Ability to  identify changes in lifestyle to reduce recurrence of condition will improve Ability to verbalize feelings will improve Ability to disclose and discuss suicidal ideas Ability to demonstrate self-control will improve Ability to identify and develop effective coping behaviors will improve     Medication Management: Evaluate patient's response, side effects, and tolerance of medication regimen.  Therapeutic Interventions: 1 to 1 sessions, Unit Group sessions and Medication administration.  Evaluation of Outcomes: Not Met   RN Treatment Plan for Primary Diagnosis: <principal problem not specified> Long Term Goal(s): Knowledge of disease and therapeutic regimen to maintain health will improve  Short Term Goals: Ability to disclose and discuss suicidal ideas, Ability to identify and develop effective coping  behaviors will improve and Compliance with prescribed medications will improve  Medication Management: RN will administer medications as ordered by provider, will assess and evaluate patient's response and provide education to patient for prescribed medication. RN will report any adverse and/or side effects to prescribing provider.  Therapeutic Interventions: 1 on 1 counseling sessions, Psychoeducation, Medication administration, Evaluate responses to treatment, Monitor vital signs and CBGs as ordered, Perform/monitor CIWA, COWS, AIMS and Fall Risk screenings as ordered, Perform wound care treatments as ordered.  Evaluation of Outcomes: Not Met   LCSW Treatment Plan for Primary Diagnosis: <principal problem not specified> Long Term Goal(s): Safe transition to appropriate next level of care at discharge, Engage patient in therapeutic group addressing interpersonal concerns.  Short Term Goals: Engage patient in aftercare planning with referrals and resources  Therapeutic Interventions: Assess for all discharge needs, 1 to 1 time with Social worker, Explore available resources and support systems, Assess for adequacy in community support network, Educate family and significant other(s) on suicide prevention, Complete Psychosocial Assessment, Interpersonal group therapy.  Evaluation of Outcomes: Not Met   Progress in Treatment: Attending groups: No. Participating in groups: No. Taking medication as prescribed: Yes. Toleration medication: Yes. Family/Significant other contact made: No, will contact:  if patient consents Patient understands diagnosis: Yes. Discussing patient identified problems/goals with staff: Yes. Medical problems stabilized or resolved: Yes. Denies suicidal/homicidal ideation: Yes. Issues/concerns per patient self-inventory: No. Other:   New problem(s) identified: None  New Short Term/Long Term Goal(s):medication stabilization, elimination of SI thoughts, development  of comprehensive mental wellness plan.    Patient Goals:   I want to get out of this hopelessness  Discharge Plan or Barriers: Patient wants to discharge to the care for [place] for ECT. CSW will continue to assess for appropriate referrals and additional discharge planning.   Reason for Continuation of Hospitalization: Depression Medication stabilization Suicidal ideation  Estimated Length of Stay:3-5 days   Attendees: Patient: 11/06/2017 11:30 AM  Physician: Dr. Myles Lipps, MD 11/06/2017 11:30 AM  Nursing: Darrol Angel, RN 11/06/2017 11:30 AM  RN Care Manager:X 11/06/2017 11:30 AM  Social Worker: Radonna Ricker, [place] 11/06/2017 11:30 AM  Recreational Therapist: Rhunette Croft 11/06/2017 11:30 AM  Other: X 11/06/2017 11:30 AM  Other: X 11/06/2017 11:30 AM  Other:X 11/06/2017 11:30 AM    Scribe for Treatment Team: Marylee Floras, [place] 11/06/2017 11:30 AM

## 2017-11-07 DIAGNOSIS — R45851 Suicidal ideations: Secondary | ICD-10-CM

## 2017-11-07 DIAGNOSIS — F419 Anxiety disorder, unspecified: Secondary | ICD-10-CM

## 2017-11-07 DIAGNOSIS — G4700 Insomnia, unspecified: Secondary | ICD-10-CM

## 2017-11-07 DIAGNOSIS — F17210 Nicotine dependence, cigarettes, uncomplicated: Secondary | ICD-10-CM

## 2017-11-07 LAB — CULTURE, BLOOD (ROUTINE X 2)
Culture: NO GROWTH
Culture: NO GROWTH
Special Requests: ADEQUATE
Special Requests: ADEQUATE

## 2017-11-07 NOTE — Progress Notes (Signed)
CSW spoke with Dr Cheral [facility] [place] no inpt beds available.  Again, potential for bed tomorrow and he asked CSW to call back and check.  Should speak to [facility], Scientist, research (life sciences), [phone]. Winferd Humphrey, MSW, LCSW Clinical Social Worker 11/07/2017 12:42 PM

## 2017-11-07 NOTE — Progress Notes (Signed)
Adult Psychoeducational Group Note  Date:  11/07/2017 Time:  9:16 PM  Group Topic/Focus:  Wrap-Up Group:   The focus of this group is to help patients review their daily goal of treatment and discuss progress on daily workbooks.  Participation Level:  Active  Participation Quality:  Appropriate  Affect:  Appropriate  Cognitive:  Alert  Insight: Appropriate  Engagement in Group:  Engaged  Modes of Intervention:  Discussion  Additional Comments:  Patient stated having a good day. Patient's goal for today was to attend most groups. Patient met goal.  Gottfried Standish L Daisy Lites 11/07/2017, 9:16 PM

## 2017-11-07 NOTE — [facility] Group Notes (Signed)
LCSW Group Therapy Note 11/07/2017 3:11 PM  Type of Therapy/Topic: Group Therapy: Feelings about Diagnosis  Participation Level: Active   Description of Group:  This group will allow patients to explore their thoughts and feelings about diagnoses they have received. Patients will be guided to explore their level of understanding and acceptance of these diagnoses. Facilitator will encourage patients to process their thoughts and feelings about the reactions of others to their diagnosis and will guide patients in identifying ways to discuss their diagnosis with significant others in their lives. This group will be process-oriented, with patients participating in exploration of their own experiences, giving and receiving support, and processing challenge from other group members.  Therapeutic Goals: 1. Patient will demonstrate understanding of diagnosis as evidenced by identifying two or more symptoms of the disorder 2. Patient will be able to express two feelings regarding the diagnosis 3. Patient will demonstrate their ability to communicate their needs through discussion and/or role play  Summary of Patient Progress:  Crystal Duarte was engaged and participated throughout the group session. Crystal Duarte states that "I accepted my diagnosis, however it can be overwhelming at times". Crystal Duarte was attentive most of the group session.     Therapeutic Modalities:  Cognitive Behavioral Therapy Brief Therapy Feelings Identification    [place] Clinical Social Worker

## 2017-11-07 NOTE — Progress Notes (Signed)
Patient self inventory- Patient slept well last night, sleep medication was requested and it was helpful. Appetite fair, energy level low, concentration poor. Depression rated 6/10, hopelessness 8/10, and anxiety 8/10 with 10 being the worst. Denies SI HI AVH. Denies physical pain and physical problems. Patient's goal is to "get out of bed by going to meals and groups."  Patient compliant with medications prescribed per provider. Safety maintained with 15 minute checks as well as environmental checks. Will continue to monitor.

## 2017-11-07 NOTE — Progress Notes (Signed)
[facility] MD Progress Note  11/07/2017 12:04 PM Crystal Duarte  MRN:  [id number] Subjective: Patient reports chronic depression, describes neurovegetative symptoms, primarily anhedonia, low energy.  Denies current suicidal plan or intention but describes ongoing passive SI.  Denies medication side effects-currently on Trintellix trial. Objective: I have discussed case with treatment team and have met with patient. 57 year old female, reports history of depression, has been diagnosed with Major Depressive Disorder.  She was admitted due to suicidal attempt/overdose, which she states was planned.  Although denies current suicidal plan or intention continues to describe passive thoughts of being better off dead.  States she has been struggling with depression for "many years" and is "tired".  She does report a prior history of response to ECT, and as reviewed in chart and with team she is currently in the process of possible referral to [facility] to initiate inpatient ECT course. She is currently on Trintellix trial, replacing Cymbalta.  She is tolerating medications well thus far. Patient reports experiencing symptoms of bronchitis upon admission to the hospital-symptoms now improved and denies coughing or shortness of breath or fever or any pleuritic pain.  Currently on Augmentin course. As reviewed with staff patient continues to endorse depression, subjective feeling of low energy, poor concentration, anhedonia.  Has denied suicidal plan or intention. Although remains depressed patient does present with some future orientation and states that she would be interested in going to a residential setting such as [facility], in [place], after discharge, particularly if ECT referral does not occur soon.   Principal Problem: MDD Diagnosis:   Patient Active Problem List   Diagnosis Date Noted  . Severe episode of recurrent major depressive disorder, without psychotic features ([place]) [F33.2] 07/18/2017  .  DOE (dyspnea on exertion) [R06.09] 12/26/2016  . COPD GOLD II  [J44.9] 12/26/2016  . Orthostatic hypotension [I95.1] 12/14/2016  . Chest tightness [R07.89] 12/14/2016  . Weight loss [R63.4] 12/14/2016  . Routine general medical examination at a health care facility [Z00.00] 10/09/2015  . 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 Hx of adenomatous polyp of colon [Z86.010] 12/04/2014  . Memory loss [R41.3] 10/04/2014  . Affective bipolar disorder ([place]) [F31.9] 03/12/2014  . Peripheral vascular disease, unspecified ([place]) [I73.9] 10/10/2013  . Smoker [F17.200] 08/28/2012  . Menopause [Z78.0] 08/28/2012  . Multiple pulmonary nodules [R91.8] 11/12/2010   Total Time spent with patient: 20 minutes  Past Psychiatric History: See admission H&P  Past Medical History:  Past Medical History:  Diagnosis Date  . Coccidioidomycosis, pulmonary ([place])   . Depression   . Hx of adenomatous polyp of colon 12/04/2014  . Osteopenia 11/2016   T score -2.2 FRAX 17%/2.4%  . Panic attack     Past Surgical History:  Procedure Laterality Date  . ABDOMINAL HYSTERECTOMY    . COLONOSCOPY  2001   hemorrhoidectomy  . LUNG SURGERY  2001   VATS surgery   . OVARIAN CYST REMOVAL  1970's  . VESICOVAGINAL FISTULA CLOSURE W/ TAH  2005   Family History:  Family History  Problem Relation Age of Onset  . Asthma Mother   . Ovarian cancer Mother   . Cancer Mother        OVARIAN  . Varicose Veins Mother   . Heart disease Father   . Peripheral vascular disease Father   . Cancer Maternal Grandmother        LUNG- LUNG  . Colon cancer Neg Hx    Family Psychiatric  History: See admission H&P Social History:  Social History   Substance and Sexual Activity  Alcohol Use No  . Alcohol/week: 0.0 oz     Social History   Substance and Sexual Activity  Drug Use No    Social History   Socioeconomic History  . Marital status: Divorced    Spouse name: Not on file  . Number of children: 2  . Years of  education: Not on file  . Highest education level: Not on file  Occupational History  . Occupation: Forensic psychologist: [employer]  . Financial resource strain: Not on file  . Food insecurity:    Worry: Not on file    Inability: Not on file  . Transportation needs:    Medical: Not on file    Non-medical: Not on file  Tobacco Use  . Smoking status: Current Some Day Smoker    Packs/day: 1.00    Years: 42.00    Pack years: 42.00    Types: Cigarettes    Last attempt to quit: 09/25/2016    Years since quitting: 1.1  . Smokeless tobacco: Former Systems developer    Quit date: 06/13/2016  Substance and Sexual Activity  . Alcohol use: No    Alcohol/week: 0.0 oz  . Drug use: No  . Sexual activity: Not on file  Lifestyle  . Physical activity:    Days per week: Not on file    Minutes per session: Not on file  . Stress: Not on file  Relationships  . Social connections:    Talks on phone: Not on file    Gets together: Not on file    Attends religious service: Not on file    Active member of club or organization: Not on file    Attends meetings of clubs or organizations: Not on file    Relationship status: Not on file  Other Topics Concern  . Not on file  Social History Narrative  . Not on file   Additional Social History:   Sleep: Fair  Appetite:  Fair  Current Medications: Current Facility-Administered Medications  Medication Dose Route Frequency Provider Last Rate Last Dose  . acetaminophen (TYLENOL) tablet 650 mg  650 mg Oral Q6H PRN Ethelene Hal, NP   650 mg at 11/06/17 0814  . acidophilus (RISAQUAD) capsule 1 capsule  1 capsule Oral Daily Sharma Covert, MD   1 capsule at 11/07/17 0803  . alum & mag hydroxide-simeth (MAALOX/MYLANTA) 200-200-20 MG/5ML suspension 30 mL  30 mL Oral Q4H PRN Ethelene Hal, NP      . amoxicillin-clavulanate (AUGMENTIN) 875-125 MG per tablet 1 tablet  1 tablet Oral Q12H Ethelene Hal, NP   1 tablet  at 11/07/17 0805  . benzonatate (TESSALON) capsule 100 mg  100 mg Oral TID Ethelene Hal, NP   100 mg at 11/07/17 0803  . busPIRone (BUSPAR) tablet 15 mg  15 mg Oral TID Sharma Covert, MD   15 mg at 11/07/17 0804  . dextromethorphan-guaiFENesin (MUCINEX DM) 30-600 MG per 12 hr tablet 1 tablet  1 tablet Oral Q12H Minda Ditto, RPH   1 tablet at 11/07/17 0805  . diclofenac sodium (VOLTAREN) 1 % transdermal gel 2 g  2 g Topical QID Sharma Covert, MD   2 g at 11/07/17 0805  . gabapentin (NEURONTIN) capsule 400 mg  400 mg Oral TID Minda Ditto, RPH   400 mg at 11/07/17 6440  . hydrOXYzine (ATARAX/VISTARIL) tablet 25 mg  25 mg Oral TID PRN Ethelene Hal, NP   25 mg at 11/06/17 2102  . magnesium hydroxide (MILK OF MAGNESIA) suspension 30 mL  30 mL Oral Daily PRN Ethelene Hal, NP      . pneumococcal 23 valent vaccine (PNU-IMMUNE) injection 0.5 mL  0.5 mL Intramuscular Tomorrow-1000 Sharma Covert, MD      . propranolol (INDERAL) tablet 20 mg  20 mg Oral TID Sharma Covert, MD   20 mg at 11/07/17 0803  . traZODone (DESYREL) tablet 200 mg  200 mg Oral QHS Sharma Covert, MD   200 mg at 11/06/17 2102  . vortioxetine HBr (TRINTELLIX) tablet 10 mg  10 mg Oral Daily Sharma Covert, MD   10 mg at 11/07/17 2951    Lab Results: No results found for this or any previous visit (from the past 42 hour(s)).  Blood Alcohol level:  Lab Results  Component Value Date   ETH <10 11/02/2017   ETH <10 88/41/6606    Metabolic Disorder Labs: Lab Results  Component Value Date   HGBA1C 5.4 07/21/2017   MPG 108.28 07/21/2017   MPG 114 08/28/2012   No results found for: PROLACTIN Lab Results  Component Value Date   CHOL 200 07/21/2017   TRIG 208 (H) 07/21/2017   HDL 54 07/21/2017   CHOLHDL 3.7 07/21/2017   VLDL 42 (H) 07/21/2017   LDLCALC 104 (H) 07/21/2017   LDLCALC 119 (H) 10/08/2015    Physical Findings: AIMS:  , ,  ,  ,    CIWA:    COWS:      Musculoskeletal: Strength & Muscle Tone: within normal limits Gait & Station: normal Patient leans: N/A  Psychiatric Specialty Exam: Physical Exam  Nursing note and vitals reviewed. Constitutional: She is oriented to person, place, and time. She appears well-developed and well-nourished.  HENT:  Head: Normocephalic and atraumatic.  Respiratory: Effort normal.  Neurological: She is alert and oriented to person, place, and time.    ROS no chest pain, no shortness of breath, no nausea, no vomiting  Blood pressure 92/71, pulse 80, temperature 98.9 F (37.2 C), temperature source Oral, resp. rate 20, height '5\' 4"'$  (1.626 m), weight 76.7 kg (169 lb), SpO2 97 %.Body mass index is 29.01 kg/m.  General Appearance: Fairly Groomed  Eye Contact:  Good  Speech:  Normal Rate  Volume:  Normal  Mood:  Depressed  Affect:  Constricted and vaguely irritable  Thought Process:  Linear and Descriptions of Associations: Intact  Orientation:  Full (Time, Place, and Person)  Thought Content:  No hallucinations, no delusions expressed  Suicidal Thoughts:  Yes.  without intent/plan-describes feeling she would rather be dead than alive, but denies current suicidal plan or intention and contracts for safety on unit, also denies any homicidal ideations   Homicidal Thoughts:  No  Memory:  Recent and remote grossly intact  Judgement:  Fair  Insight:  Present  Psychomotor Activity:  Normal  Concentration:  Concentration: Good and Attention Span: Good  Recall:  Good  Fund of Knowledge:  Good  Language:  Good  Akathisia:  Negative  Handed:  Right  AIMS (if indicated):     Assets:  Desire for Improvement Financial Resources/Insurance Housing Resilience Social Support Talents/Skills  ADL's:  Intact  Cognition:  WNL  Sleep:  Number of Hours: 6.5    Assessment -57 year old female with a history of chronic depression, carries diagnoses of major depressive disorder, status post suicidal attempt by  overdose.  Remains depressed with some neuro vegetative symptoms, describes persistent passive suicidal ideations but denies any current plan or intention of hurting herself or of suicide and does present future oriented, expressing interest in ECT treatment, which she has had before with good results, or going to a residential setting [facility]) at discharge.  Currently on Trintellix trial, which is new for patient and which has replaced Cymbalta.  She is tolerating medications well thus far.  Treatment Plan Summary: Treatment plan reviewed as below today August 6 Treatment team working on disposition planning-current plan is to transfer to [facility] with the purpose of starting inpatient ECT.  Staff has, with patient's consent, establish contact with above hospital and following up on potential bed availability to initiate transfer. Continue Trintellix for depression and anxiety, currently increased to 10 mg daily Continue Trazodone 200 mg nightly for insomnia Continue Propranolol 20 mg 3 times daily for anxiety Continue BuSpar 15 mg 3 times daily for anxiety Continue Neurontin 400 mg 3 times daily for anxiety/pain Continue Vistaril 25 mg every 8 hours PRN for anxiety as needed     Jenne Campus, MD 11/07/2017, 12:04 PM    Patient ID: Gardiner Ramus, female   DOB: 15-Aug-1960, 57 y.o.   MRN: [id number]

## 2017-11-07 NOTE — Progress Notes (Signed)
Patient ID: Crystal Duarte, female   DOB: 22-Mar-1961, 57 y.o.   MRN: [id number] Crystal Duarte Note: Pt complained of moderate anxiety and depression; denied SI/HI. Pt also denied AVH or pain. Support, encouragement, and safe environment provided. 15-minute safety checks continue. Safety checks continue. Pt continue to be med compliant.

## 2017-11-07 NOTE — Plan of Care (Signed)
  Problem: Education: Goal: Emotional status will improve Outcome: Progressing   Problem: Education: Goal: Mental status will improve Outcome: Progressing   Problem: Education: Goal: Verbalization of understanding the information provided will improve Outcome: Progressing   Problem: Activity: Goal: Interest or engagement in activities will improve Outcome: Progressing   Problem: Activity: Goal: Sleeping patterns will improve Outcome: Progressing

## 2017-11-08 MED ORDER — CLOTRIMAZOLE 1 % VA CREA
1.0000 | TOPICAL_CREAM | Freq: Every day | VAGINAL | Status: DC
  Administered 2017-11-08 – 2017-11-09 (×2): 1 via VAGINAL
  Filled 2017-11-08 (×2): qty 45

## 2017-11-08 NOTE — Plan of Care (Signed)
  Problem: Education: Goal: Emotional status will improve Outcome: Progressing   Problem: Activity: Goal: Interest or engagement in activities will improve Outcome: Progressing   Problem: Activity: Goal: Sleeping patterns will improve Outcome: Progressing   Problem: Coping: Goal: Ability to verbalize frustrations and anger appropriately will improve Outcome: Progressing

## 2017-11-08 NOTE — Progress Notes (Signed)
Adult Psychoeducational Group Note  Date:  11/08/2017 Time:  9:07 PM  Group Topic/Focus:  Wrap-Up Group:   The focus of this group is to help patients review their daily goal of treatment and discuss progress on daily workbooks.  Participation Level:  Minimal  Participation Quality:  Appropriate  Affect:  Appropriate  Cognitive:  Alert  Insight: Appropriate  Engagement in Group:  Engaged  Modes of Intervention:  Discussion and Education  Additional Comments:    Pt participated in group. Pt states she had a good day. Pt's goal today was to to figure out what she will be doing after she leaves here. Pt discussed aftercare with her daughter. Pt states one positive thing that happened today was being able to see her daughter.  Lita Mains 11/08/2017, 9:07 PM

## 2017-11-08 NOTE — Progress Notes (Signed)
D: Patient endorses passive SI but contracts for safety and denies HI/AVH. Patient presents as flat and depressed but pleasant.  Pt. States that she is "tired" because she anxious waiting for a plan to be developed for her treatment regarding ECT.  Pt. States she did have a "moment of hope" today when she found out about an outpatient resource that may be an option for her.  Pt. Complained of dry mouth, encouraged patient to increase fluid intake and was given a pitcher of ice water.  A: Patient given emotional support from RN. Patient encouraged to come to staff with concerns and/or questions. Patient's medication routine continued. Patient's orders and plan of care reviewed.   R: Patient remains appropriate and cooperative. Will continue to monitor patient q15 minutes for safety.

## 2017-11-08 NOTE — Progress Notes (Signed)
Patient was anxious upon approach. Patient endorses passive SI, saying "it's always kind of there." Contracts for safety and denies HI AVH. Patient is anxious about getting into [facility] for ECT and continues to ask writer about it. Patient is complaining of lack of communication between staff and her, but possibly just due to anxiety. Patient said she did not sleep well last night and said there's "a lot on my mind." Patient complained again of dry mouth and mouth sores. Hydration was encouraged. Patient also complained of a yeast infection due to her antibiotics. MD was notified per patient. Patient is taking medications prescribed per provider.  Safety is maintained with 15 minute checks as well as environmental checks.

## 2017-11-08 NOTE — Progress Notes (Signed)
CSW spoke to [place], Scientist, research (life sciences), [phone], twice: once at 1030 and once at noon.  No beds available today.  [facility] said they did not have enough beds for their waiting pts in the ED.  Can try again tomorrow.  Dr Parke Poisson and Lars Pinks, RN/UR informed. Winferd Humphrey, MSW, LCSW Clinical Social Worker 11/08/2017 12:00 PM

## 2017-11-08 NOTE — Therapy (Signed)
Occupational Therapy Group Note  Date:  11/08/2017 Time:  2:51 PM  Group Topic/Focus:  Stress Management  Participation Level:  Active  Participation Quality:  Appropriate  Affect:  Blunted  Cognitive:  Appropriate  Insight: Improving  Engagement in Group:  Engaged  Modes of Intervention:  Activity, Discussion, Education and Socialization  Additional Comments:    S: "I like to do use my noise machine when I am stressed"  ?  O: Education given on healthy stress management, craft made as therapeutic activity to manage stress in relation to topic.Stress management tools worksheet discussed to differentiate negative vs positive coping skills. Pt asked to identify positive coping skills this date to use when reintegrating into community. Gratitude journal and adult coloring pages given at end of session.  ?  A: Pt presents to group with flat/blunted affect, engaged and participatory throughout session. Stress management craft made independently with increase in affect. Stress management tools worksheet completed, pt stating she enjoys listening to her noise machine when she is stressed (white noise, nature sounds). She is agreeable to trying guided meditation this date using Youtube. Gratitude journal and adult coloring given at end of session.  ?  P: Pt provided with education on stress management activities to implement into daily routine. Handouts given to facilitate carryover when reintegrating into community.      [name], MSOT, OTR/L  [place] 11/08/2017, 2:51 PM

## 2017-11-08 NOTE — [facility] Group Notes (Signed)
[facility] Mental [facility] Association Group Therapy 11/08/2017 1:15pm  Type of Therapy: Mental Health Association Presentation  Participation Level: Active  Participation Quality: Attentive  Affect: Appropriate  Cognitive: Oriented  Insight: Developing/Improving  Engagement in Therapy: Engaged  Modes of Intervention: Discussion, Education and Socialization  Summary of Progress/Problems: [place] ([place] Speaker came to talk about his personal journey with mental health. The pt processed ways by which to relate to the speaker. [place] speaker provided handouts and educational information pertaining to groups and services offered by the [facility]. Pt was engaged in speaker's presentation and was receptive to resources provided.    Joanne Chars, LCSW 11/08/2017 1:30 PM

## 2017-11-08 NOTE — Progress Notes (Addendum)
[facility] MD Progress Note  11/08/2017 1:17 PM Crystal Duarte  MRN:  [id number] Subjective: Patient reports ongoing depression, sadness, ruminations about being a burden to her adult children.  Endorses intermittent passive suicidal ideations but currently denies plan or intention of suicide and contracts for safety on unit.  Reports "I feel tired, I feel run down". At this time denies medication side effects. Objective: I have discussed case with treatment team and have met with patient. 57 year old female, reports history of depression, has been diagnosed with Major Depressive Disorder.  She was admitted due to suicidal attempt/overdose, which she states was planned.   Patient reports persistent depression with limited improvement thus far.  Endorses passive thoughts of death and tends to ruminate about being a burden to her family but denies suicidal plan or intention and today does seem more future oriented and better able to discuss disposition plans and options. No disruptive or agitated behaviors on unit. Tolerating medications well.  She is on Trintellix which is a new trial for her, dose was increased from 5 to 10 mg 2 days ago.  She is tolerating well thus far.  We have also discussed potential augmentation strategies to include addition of Abilify or Lithium, patient states these were not well-tolerated in the past. I met with patient along with her adult daughter, who is supportive and involved.  Daughter expresses concern the patient continues to present with significant/severe depression.  Daughter feels that patient's living situation (patient lives with her elderly mother whom they state difficult , demanding) is a contributor to her depression and is hoping that patient will accept to move in with her son or with her at discharge. Daughter also reiterates that patient seems to have done well with ECT course in the past with very noticeable improvement.  Patient acknowledges that ECT was helpful  and well-tolerated with unidirectional ECT, as bidirectional was tried initially but caused excessive amnesia. Patient also expressed interest in [facility] as a possible option.    Principal Problem: MDD Diagnosis:   Patient Active Problem List   Diagnosis Date Noted  . Severe episode of recurrent major depressive disorder, without psychotic features ([place]) [F33.2] 07/18/2017  . DOE (dyspnea on exertion) [R06.09] 12/26/2016  . COPD GOLD II  [J44.9] 12/26/2016  . Orthostatic hypotension [I95.1] 12/14/2016  . Chest tightness [R07.89] 12/14/2016  . Weight loss [R63.4] 12/14/2016  . Routine general medical examination at a health care facility [Z00.00] 10/09/2015  . 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 Hx of adenomatous polyp of colon [Z86.010] 12/04/2014  . Memory loss [R41.3] 10/04/2014  . Affective bipolar disorder ([place]) [F31.9] 03/12/2014  . Peripheral vascular disease, unspecified ([place]) [I73.9] 10/10/2013  . Smoker [F17.200] 08/28/2012  . Menopause [Z78.0] 08/28/2012  . Multiple pulmonary nodules [R91.8] 11/12/2010   Total Time spent with patient: 30 minutes  Past Psychiatric History: See admission H&P  Past Medical History:  Past Medical History:  Diagnosis Date  . Coccidioidomycosis, pulmonary ([place])   . Depression   . Hx of adenomatous polyp of colon 12/04/2014  . Osteopenia 11/2016   T score -2.2 FRAX 17%/2.4%  . Panic attack     Past Surgical History:  Procedure Laterality Date  . ABDOMINAL HYSTERECTOMY    . COLONOSCOPY  2001   hemorrhoidectomy  . LUNG SURGERY  2001   VATS surgery   . OVARIAN CYST REMOVAL  1970's  . VESICOVAGINAL FISTULA CLOSURE W/ TAH  2005   Family History:  Family History  Problem Relation Age  of Onset  . Asthma Mother   . Ovarian cancer Mother   . Cancer Mother        OVARIAN  . Varicose Veins Mother   . Heart disease Father   . Peripheral vascular disease Father   . Cancer Maternal Grandmother        LUNG- LUNG  . Colon cancer  Neg Hx    Family Psychiatric  History: See admission H&P Social History:  Social History   Substance and Sexual Activity  Alcohol Use No  . Alcohol/week: 0.0 oz     Social History   Substance and Sexual Activity  Drug Use No    Social History   Socioeconomic History  . Marital status: Divorced    Spouse name: Not on file  . Number of children: 2  . Years of education: Not on file  . Highest education level: Not on file  Occupational History  . Occupation: Forensic psychologist: [employer]  . Financial resource strain: Not on file  . Food insecurity:    Worry: Not on file    Inability: Not on file  . Transportation needs:    Medical: Not on file    Non-medical: Not on file  Tobacco Use  . Smoking status: Current Some Day Smoker    Packs/day: 1.00    Years: 42.00    Pack years: 42.00    Types: Cigarettes    Last attempt to quit: 09/25/2016    Years since quitting: 1.1  . Smokeless tobacco: Former Systems developer    Quit date: 06/13/2016  Substance and Sexual Activity  . Alcohol use: No    Alcohol/week: 0.0 oz  . Drug use: No  . Sexual activity: Not on file  Lifestyle  . Physical activity:    Days per week: Not on file    Minutes per session: Not on file  . Stress: Not on file  Relationships  . Social connections:    Talks on phone: Not on file    Gets together: Not on file    Attends religious service: Not on file    Active member of club or organization: Not on file    Attends meetings of clubs or organizations: Not on file    Relationship status: Not on file  Other Topics Concern  . Not on file  Social History Narrative  . Not on file   Additional Social History:   Sleep: Improving  Appetite:  Fair  Current Medications: Current Facility-Administered Medications  Medication Dose Route Frequency Provider Last Rate Last Dose  . acetaminophen (TYLENOL) tablet 650 mg  650 mg Oral Q6H PRN Ethelene Hal, NP   650 mg at 11/06/17  0814  . acidophilus (RISAQUAD) capsule 1 capsule  1 capsule Oral Daily Sharma Covert, MD   1 capsule at 11/08/17 0809  . alum & mag hydroxide-simeth (MAALOX/MYLANTA) 200-200-20 MG/5ML suspension 30 mL  30 mL Oral Q4H PRN Ethelene Hal, NP      . amoxicillin-clavulanate (AUGMENTIN) 875-125 MG per tablet 1 tablet  1 tablet Oral Q12H Ethelene Hal, NP   1 tablet at 11/08/17 0809  . benzonatate (TESSALON) capsule 100 mg  100 mg Oral TID Ethelene Hal, NP   100 mg at 11/08/17 1208  . busPIRone (BUSPAR) tablet 15 mg  15 mg Oral TID Sharma Covert, MD   15 mg at 11/08/17 1207  . dextromethorphan-guaiFENesin (MUCINEX DM) 30-600 MG per 12 hr  tablet 1 tablet  1 tablet Oral Q12H Minda Ditto, RPH   1 tablet at 11/08/17 7035  . diclofenac sodium (VOLTAREN) 1 % transdermal gel 2 g  2 g Topical QID Sharma Covert, MD   2 g at 11/08/17 [phone]  . gabapentin (NEURONTIN) capsule 400 mg  400 mg Oral TID Minda Ditto, RPH   400 mg at 11/08/17 1208  . hydrOXYzine (ATARAX/VISTARIL) tablet 25 mg  25 mg Oral TID PRN Ethelene Hal, NP   25 mg at 11/06/17 2102  . magnesium hydroxide (MILK OF MAGNESIA) suspension 30 mL  30 mL Oral Daily PRN Ethelene Hal, NP      . pneumococcal 23 valent vaccine (PNU-IMMUNE) injection 0.5 mL  0.5 mL Intramuscular Tomorrow-1000 Sharma Covert, MD      . propranolol (INDERAL) tablet 20 mg  20 mg Oral TID Sharma Covert, MD   20 mg at 11/08/17 1208  . traZODone (DESYREL) tablet 200 mg  200 mg Oral QHS Sharma Covert, MD   200 mg at 11/07/17 2137  . vortioxetine HBr (TRINTELLIX) tablet 10 mg  10 mg Oral Daily Sharma Covert, MD   10 mg at 11/08/17 8182    Lab Results: No results found for this or any previous visit (from the past 48 hour(s)).  Blood Alcohol level:  Lab Results  Component Value Date   ETH <10 11/02/2017   ETH <10 99/37/1696    Metabolic Disorder Labs: Lab Results  Component Value Date   HGBA1C 5.4  07/21/2017   MPG 108.28 07/21/2017   MPG 114 08/28/2012   No results found for: PROLACTIN Lab Results  Component Value Date   CHOL 200 07/21/2017   TRIG 208 (H) 07/21/2017   HDL 54 07/21/2017   CHOLHDL 3.7 07/21/2017   VLDL 42 (H) 07/21/2017   LDLCALC 104 (H) 07/21/2017   LDLCALC 119 (H) 10/08/2015    Physical Findings: AIMS:  , ,  ,  ,    CIWA:    COWS:     Musculoskeletal: Strength & Muscle Tone: within normal limits Gait & Station: normal Patient leans: N/A  Psychiatric Specialty Exam: Physical Exam  Nursing note and vitals reviewed. Constitutional: She is oriented to person, place, and time. She appears well-developed and well-nourished.  HENT:  Head: Normocephalic and atraumatic.  Respiratory: Effort normal.  Neurological: She is alert and oriented to person, place, and time.    ROS reports dry mouth ,no chest pain, no shortness of breath, no nausea, no vomiting, describes some GU discomfort, feels she may have a "yeast infection".  Blood pressure 130/73, pulse 67, temperature 98.4 F (36.9 C), temperature source Oral, resp. rate 16, height '5\' 4"'$  (1.626 m), weight 76.7 kg (169 lb), SpO2 97 %.Body mass index is 29.01 kg/m.  General Appearance: Fairly Groomed  Eye Contact:  Good  Speech:  Normal Rate  Volume:  Normal  Mood:  Depressed  Affect:  Remains sad, constricted  Thought Process:  Linear and Descriptions of Associations: Intact  Orientation:  Full (Time, Place, and Person)  Thought Content:  No hallucinations, no delusions expressed  Suicidal Thoughts:  Yes.  without intent/plan-denies suicidal plan or intention, contracts for safety at this time  Homicidal Thoughts:  No  Memory:  Recent and remote grossly intact  Judgement:  Fair  Insight:  Present  Psychomotor Activity:  Normal  Concentration:  Concentration: Good and Attention Span: Good  Recall:  Good  Fund of Knowledge:  Good  Language:  Good  Akathisia:  Negative  Handed:  Right  AIMS (if  indicated):     Assets:  Desire for Improvement Financial Resources/Insurance Housing Resilience Social Support Talents/Skills  ADL's:  Intact  Cognition:  WNL  Sleep:  Number of Hours: 6.75    Assessment -57 year old female with a history of chronic depression, carries diagnoses of major depressive disorder, status post suicidal attempt by overdose.  Patient remains depressed, constricted, vaguely irritable, ruminative, reporting passive/intermittent thoughts of death and ruminations about being a burden to her family.  There is, however, some improvement compared to admission.  Daughter who came today for family session, states patient seems improved, and patient appears more future oriented, better able to discuss potential options, less pessimistic/less hopeless.  Both patient and daughter describe history of good response to ECT in the past.  The plan is currently to transfer patient to [facility] and staff has been contacting  said hospital daily for bed availability-today CSW was told that no bed is available at this time.   Treatment Plan Summary: Treatment plan reviewed as below today August 7 Treatment team working on disposition planning-current plan is to transfer to [facility] with the purpose of starting inpatient ECT.  Continue to communicate daily/as needed, with patient's consent, for bed availability Continue Trintellix  10 mg daily for depression  Continue Trazodone 200 mg nightly for insomnia Continue Propranolol 20 mg 3 times daily for anxiety Continue BuSpar 15 mg 3 times daily for anxiety Continue Neurontin 400 mg 3 times daily for anxiety/pain Continue Vistaril 25 mg every 8 hours PRN for anxiety as needed We will check TSH, UA, U Culture     Jenne Campus, MD 11/08/2017, 1:17 PM    Patient ID: Crystal Duarte, female   DOB: Apr 30, 1960, 57 y.o.   MRN: [id number]

## 2017-11-09 LAB — TSH: TSH: 2.197 u[IU]/mL (ref 0.350–4.500)

## 2017-11-09 NOTE — B[facility] Group Notes (Signed)
[facility] LCSW Group Therapy Note  Date/Time: 11/09/17, 1315  Type of Therapy/Topic:  Group Therapy:  Balance in Life  Participation Level:  minimal  Description of Group:    This group will address the concept of balance and how it feels and looks when one is unbalanced. Patients will be encouraged to process areas in their lives that are out of balance, and identify reasons for remaining unbalanced. Facilitators will guide patients utilizing problem- solving interventions to address and correct the stressor making their life unbalanced. Understanding and applying boundaries will be explored and addressed for obtaining  and maintaining a balanced life. Patients will be encouraged to explore ways to assertively make their unbalanced needs known to significant others in their lives, using other group members and facilitator for support and feedback.  Therapeutic Goals: 1. Patient will identify two or more emotions or situations they have that consume much of in their lives. 2. Patient will identify signs/triggers that life has become out of balance:  3. Patient will identify two ways to set boundaries in order to achieve balance in their lives:  4. Patient will demonstrate ability to communicate their needs through discussion and/or role plays  Summary of Patient Progress:Pt did not speak up in group until CSW asked her a question.  She identified financial and mental/emotional as areas that are out of balance.  She was attentive during group discussion.            Therapeutic Modalities:   Cognitive Behavioral Therapy Solution-Focused Therapy Assertiveness Training  Lurline Idol, [place]

## 2017-11-09 NOTE — Progress Notes (Signed)
[facility] Group Notes:  (Nursing/MHT/Case Management/Adjunct)  Date:  11/09/2017  Time:  2030  Type of Therapy:  wrap up group  Participation Level:  Minimal  Participation Quality:  Supportive  Affect:  Flat  Cognitive:  Appropriate  Insight:  Improving  Engagement in Group:  Supportive  Modes of Intervention:  Clarification, Education and Support  Summary of Progress/Problems: Pt feels like she is stuck here just waiting for the next step in her treatment. If patient could change any one thing it would be her mental illness. Pt is grateful for her 7 grandchildren.   Shellia Cleverly 11/09/2017, 9:58 PM

## 2017-11-09 NOTE — Progress Notes (Addendum)
    CSW spoke to Crystal Duarte, Scientist, research (life sciences) at [facility] no open beds. Can check back tomorrow.  Probably no transfers would occur over the weekend.  Pt son has been calling her as well and has been asking whether or not CSW has been check on bed status.  Crystal Duarte informed him that we have been talking daily. Winferd Humphrey, MSW, LCSW Clinical Social Worker 11/09/2017 12:46 PM   CSW spoke to Crystal Duarte in TTS at [facility].  Crystal Duarte is on vacation but informed TTS before leaving that he would not be accepting any new pts prior to 8/16.  Crystal Parke Poisson informed. Winferd Humphrey, MSW, LCSW Clinical Social Worker 11/09/2017 12:48 PM   CSW spoke with pt son, Crystal Duarte, and updated him.  He asked about pursuing outpt ECT and said he is able to provide transportation to get pt to and from and to "take custody" of her if she was discharged from the hospital.    [place] spoke to Crystal Duarte again and was directed to [facility] in the outpt office, [phone].  CSW called and left message for Crystal Duarte. Winferd Humphrey, MSW, LCSW Clinical Social Worker 11/09/2017 1:00 PM

## 2017-11-09 NOTE — Progress Notes (Signed)
D: Patient endorses passive SI but contracts for safety and denies HI or AVH. Patient is flat and depressed but brightens some on approach.  She still maintains that her biggest stressor right now is not knowing what's going to happen with regards to ECT treatment and living arrangements after discharge.  Pt. States that she has a "hard time asking for help and saying no" which contribute to her depression.  Pt. Reported having a good visit with her daughter today.    A: Patient given emotional support from RN. Patient encouraged to come to staff with concerns and/or questions. Patient's medication routine continued. Patient's orders and plan of care reviewed.   R: Patient remains appropriate and cooperative. Will continue to monitor patient q15 minutes for safety.

## 2017-11-09 NOTE — Progress Notes (Signed)
Pt was observed in the dayroom, attending wrap-up group. Pt appears flat/anxious/irritable in affect and mood. Pt endorses passive SI but verbal contracts for safety. Pt denies HI/AVH at this time. Pt rates pain 10/10; posterior neck and left wrist. Pt states neck pain have progressively worsen today. Pt state she was hoping to talk to provider tomorrow about med-regimen/pain control. Heat packs offered for comfort; not needed at this time. PRN tylenol and vistaril requested and given. Support and encouragement provided. Will continue with POC.

## 2017-11-09 NOTE — Progress Notes (Signed)
[facility] MD Progress Note  11/09/2017 1:38 PM Crystal Duarte  MRN:  [id number] Subjective: patient describes persistent depression, but does report that today " I feel a little bit better so far". States she feels less severely depressed this morning . Denies suicidal ideations.  Denies medication side effects, but states she noted  bilateral pedal  edema yesterday evening which resolved after going to bed. This morning no significant edema is noted . Reports some GU irritation/discomfort which she states is related to " yeast infection", as has had similar episodes in the past. Objective: I have discussed case with treatment team and have met with patient. 57 year old female, reports history of depression, has been diagnosed with Major Depressive Disorder.  She was admitted due to suicidal attempt/overdose, which she states was planned.   Patient remains depressed, but as above today reports partially improved mood and feels less severely depressed this AM. She does endorse ongoing subjective sense of sadness, anhedonia. Denies suicidal ideations at this time. No disruptive or agitated behaviors on unit. Somatic concerns as above . Of note, patient had been started on antibiotic and Tessalon/Mucinex due to upper respiratory infection, which is now improved. Currently reports symptoms such as  postnasal drip/ odynophagia resolved and denies shortness of breath or current cough.  Visible on unit, going to groups, behavior on unit in good control. Labs reviewed- TSH WNL 2.197      Principal Problem: MDD Diagnosis:   Patient Active Problem List   Diagnosis Date Noted  . Severe episode of recurrent major depressive disorder, without psychotic features ([place]) [F33.2] 07/18/2017  . DOE (dyspnea on exertion) [R06.09] 12/26/2016  . COPD GOLD II  [J44.9] 12/26/2016  . Orthostatic hypotension [I95.1] 12/14/2016  . Chest tightness [R07.89] 12/14/2016  . Weight loss [R63.4] 12/14/2016  . Routine general  medical examination at a health care facility [Z00.00] 10/09/2015  . 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 Hx of adenomatous polyp of colon [Z86.010] 12/04/2014  . Memory loss [R41.3] 10/04/2014  . Affective bipolar disorder ([place]) [F31.9] 03/12/2014  . Peripheral vascular disease, unspecified ([place]) [I73.9] 10/10/2013  . Smoker [F17.200] 08/28/2012  . Menopause [Z78.0] 08/28/2012  . Multiple pulmonary nodules [R91.8] 11/12/2010   Total Time spent with patient: 20 minutes  Past Psychiatric History: See admission H&P  Past Medical History:  Past Medical History:  Diagnosis Date  . Coccidioidomycosis, pulmonary ([place])   . Depression   . Hx of adenomatous polyp of colon 12/04/2014  . Osteopenia 11/2016   T score -2.2 FRAX 17%/2.4%  . Panic attack     Past Surgical History:  Procedure Laterality Date  . ABDOMINAL HYSTERECTOMY    . COLONOSCOPY  2001   hemorrhoidectomy  . LUNG SURGERY  2001   VATS surgery   . OVARIAN CYST REMOVAL  1970's  . VESICOVAGINAL FISTULA CLOSURE W/ TAH  2005   Family History:  Family History  Problem Relation Age of Onset  . Asthma Mother   . Ovarian cancer Mother   . Cancer Mother        OVARIAN  . Varicose Veins Mother   . Heart disease Father   . Peripheral vascular disease Father   . Cancer Maternal Grandmother        LUNG- LUNG  . Colon cancer Neg Hx    Family Psychiatric  History: See admission H&P Social History:  Social History   Substance and Sexual Activity  Alcohol Use No  . Alcohol/week: 0.0 standard drinks  Social History   Substance and Sexual Activity  Drug Use No    Social History   Socioeconomic History  . Marital status: Divorced    Spouse name: Not on file  . Number of children: 2  . Years of education: Not on file  . Highest education level: Not on file  Occupational History  . Occupation: Forensic psychologist: [employer]  . Financial resource strain: Not on file  . Food  insecurity:    Worry: Not on file    Inability: Not on file  . Transportation needs:    Medical: Not on file    Non-medical: Not on file  Tobacco Use  . Smoking status: Current Some Day Smoker    Packs/day: 1.00    Years: 42.00    Pack years: 42.00    Types: Cigarettes    Last attempt to quit: 09/25/2016    Years since quitting: 1.1  . Smokeless tobacco: Former Systems developer    Quit date: 06/13/2016  Substance and Sexual Activity  . Alcohol use: No    Alcohol/week: 0.0 standard drinks  . Drug use: No  . Sexual activity: Not on file  Lifestyle  . Physical activity:    Days per week: Not on file    Minutes per session: Not on file  . Stress: Not on file  Relationships  . Social connections:    Talks on phone: Not on file    Gets together: Not on file    Attends religious service: Not on file    Active member of club or organization: Not on file    Attends meetings of clubs or organizations: Not on file    Relationship status: Not on file  Other Topics Concern  . Not on file  Social History Narrative  . Not on file   Additional Social History:   Sleep: Improving  Appetite:  improving  Current Medications: Current Facility-Administered Medications  Medication Dose Route Frequency Provider Last Rate Last Dose  . acetaminophen (TYLENOL) tablet 650 mg  650 mg Oral Q6H PRN Ethelene Hal, NP   650 mg at 11/06/17 0814  . acidophilus (RISAQUAD) capsule 1 capsule  1 capsule Oral Daily Sharma Covert, MD   1 capsule at 11/09/17 5520  . alum & mag hydroxide-simeth (MAALOX/MYLANTA) 200-200-20 MG/5ML suspension 30 mL  30 mL Oral Q4H PRN Ethelene Hal, NP      . busPIRone (BUSPAR) tablet 15 mg  15 mg Oral TID Sharma Covert, MD   15 mg at 11/09/17 1213  . clotrimazole (GYNE-LOTRIMIN) vaginal cream 1 Applicatorful  1 Applicatorful Vaginal QHS Laverle Hobby, PA-C   1 Applicatorful at 80/22/33 2302  . diclofenac sodium (VOLTAREN) 1 % transdermal gel 2 g  2 g Topical  QID Sharma Covert, MD   2 g at 11/08/17 [phone]  . gabapentin (NEURONTIN) capsule 400 mg  400 mg Oral TID Minda Ditto, RPH   400 mg at 11/09/17 1213  . hydrOXYzine (ATARAX/VISTARIL) tablet 25 mg  25 mg Oral TID PRN Ethelene Hal, NP   25 mg at 11/09/17 0807  . magnesium hydroxide (MILK OF MAGNESIA) suspension 30 mL  30 mL Oral Daily PRN Ethelene Hal, NP      . pneumococcal 23 valent vaccine (PNU-IMMUNE) injection 0.5 mL  0.5 mL Intramuscular Tomorrow-1000 Sharma Covert, MD      . propranolol (INDERAL) tablet 20 mg  20 mg Oral  TID Sharma Covert, MD   20 mg at 11/09/17 1213  . traZODone (DESYREL) tablet 200 mg  200 mg Oral QHS Sharma Covert, MD   200 mg at 11/08/17 2301  . vortioxetine HBr (TRINTELLIX) tablet 10 mg  10 mg Oral Daily Sharma Covert, MD   10 mg at 11/09/17 1751    Lab Results:  Results for orders placed or performed during the hospital encounter of 11/03/17 (from the past 48 hour(s))  TSH     Status: None   Collection Time: 11/09/17  6:43 AM  Result Value Ref Range   TSH 2.197 0.350 - 4.500 uIU/mL    Comment: Performed by a 3rd Generation assay with a functional sensitivity of <=0.01 uIU/mL. Performed at [facility], [place] [address], [place], [place] [address]     Blood Alcohol level:  Lab Results  Component Value Date   ETH <10 11/02/2017   ETH <10 27/78/2423    Metabolic Disorder Labs: Lab Results  Component Value Date   HGBA1C 5.4 07/21/2017   MPG 108.28 07/21/2017   MPG 114 08/28/2012   No results found for: PROLACTIN Lab Results  Component Value Date   CHOL 200 07/21/2017   TRIG 208 (H) 07/21/2017   HDL 54 07/21/2017   CHOLHDL 3.7 07/21/2017   VLDL 42 (H) 07/21/2017   LDLCALC 104 (H) 07/21/2017   LDLCALC 119 (H) 10/08/2015    Physical Findings: AIMS:  , ,  ,  ,    CIWA:    COWS:     Musculoskeletal: Strength & Muscle Tone: within normal limits Gait & Station: normal Patient leans:  N/A  Psychiatric Specialty Exam: Physical Exam  Nursing note and vitals reviewed. Constitutional: She is oriented to person, place, and time. She appears well-developed and well-nourished.  HENT:  Head: Normocephalic and atraumatic.  Respiratory: Effort normal.  Neurological: She is alert and oriented to person, place, and time.    ROS reports dry mouth ,no chest pain, no shortness of breath, no nausea, no vomiting, describes some GU discomfort, feels she may have a "yeast infection".  Blood pressure 121/84, pulse 84, temperature 98.2 F (36.8 C), temperature source Oral, resp. rate 16, height _0  (1.626 m), weight 76.7 kg, SpO2 97 %.Body mass index is 29.01 kg/m.  General Appearance: Fairly Groomed  Eye Contact:  Good  Speech:  Normal Rate  Volume:  Normal  Mood:  remains depressed, but today endorses partial improvement  Affect:  constricted, less irritable, smiles briefly at times   Thought Process:  Linear and Descriptions of Associations: Intact  Orientation:  Full (Time, Place, and Person)  Thought Content:  No hallucinations, no delusions expressed  Suicidal Thoughts:  No-denies suicidal plan or intention, contracts for safety at this time  Homicidal Thoughts:  No  Memory:  Recent and remote grossly intact  Judgement:  Other:  improving   Insight:  Present  Psychomotor Activity:  Normal  Concentration:  Concentration: Good and Attention Span: Good  Recall:  Good  Fund of Knowledge:  Good  Language:  Good  Akathisia:  Negative  Handed:  Right  AIMS (if indicated):     Assets:  Desire for Improvement Financial Resources/Insurance Housing Resilience Social Support Talents/Skills  ADL's:  Intact  Cognition:  WNL  Sleep:  Number of Hours: 5.75    Assessment -57 year old female with a history of chronic depression, carries diagnoses of major depressive disorder, status post suicidal attempt by overdose.  Patient  remains depressed, constricted, but today reports she  is starting to feel better and that she notes  some improvement in mood . Denies SI at this time. She is tolerating medications well at present. Current plan is for patient to transfer to [facility] in order to start inpatient ECT, which has been helpful in the past, but there has been no bed availability thus far .    Treatment Plan Summary: Treatment plan reviewed as below today August 8 Treatment team working on disposition planning-current plan is to transfer to [facility] with the purpose of starting inpatient ECT.  CSW/staff continues to call in daily  with patient's consent, to check in on bed availability Continue Trintellix  10 mg daily for depression  Continue Trazodone 200 mg nightly for insomnia Continue Propranolol 20 mg 3 times daily for anxiety Continue BuSpar 15 mg 3 times daily for anxiety Continue Neurontin 400 mg 3 times daily for anxiety/pain Continue Vistaril 25 mg every 8 hours PRN for anxiety as needed Discontinue Tessalon, Mucinex, Augmentin. Clotrimazole cream to address GU symptoms     Jenne Campus, MD 11/09/2017, 1:38 PM    Patient ID: Gardiner Ramus, female   DOB: May 22, 1960, 57 y.o.   MRN: [id number]

## 2017-11-09 NOTE — Progress Notes (Signed)
D: Patient is visible in the milieu; she is currently in the day room with minimal interaction with her peers.  She is concerned about where she will go upon discharge.  She states that she will probably go live with her son, however, he has several small children.  She is currently residing with her daughter (on her couch).  Her mood appears depressed and she becomes anxious talking about her living situation.  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    A: Continue to monitor medication management and MD orders.  Safety checks completed every 15 minutes per protocol.  Offer support and encouragement as needed.  R: Patient is receptive to staff; her behavior is appropriate.

## 2017-11-10 MED ORDER — FLUCONAZOLE 150 MG PO TABS
150.0000 mg | ORAL_TABLET | Freq: Once | ORAL | Status: AC
  Administered 2017-11-10: 150 mg via ORAL
  Filled 2017-11-10 (×2): qty 1

## 2017-11-10 NOTE — Progress Notes (Signed)
Recreation Therapy Notes  Date: 8.9.19 Time: 0930 Location: 300 Hall Dayroom  Group Topic: Stress Management  Goal Area(s) Addresses:  Patient will verbalize importance of using healthy stress management.  Patient will identify positive emotions associated with healthy stress management.   Intervention: Stress Management  Activity :  Guided Imagery.  LRT introduced the stress management technique of guided imagery.  LRT read a script to guide patients on mental vacation through a wildlife sanctuary.  Patients were to listen and follow along as LRT read script.  Education:  Stress Management, Discharge Planning.   Education Outcome: Acknowledges edcuation/In group clarification offered/Needs additional education  Clinical Observations/Feedback: Pt did not attend group.     Victorino Sparrow, LRT/CTRS         Ria Comment, Khaleef Ruby A 11/10/2017 11:11 AM

## 2017-11-10 NOTE — [facility] Group Notes (Signed)
  [facility] LCSW Group Therapy Note  Date/Time: 11/10/17, 1315  Type of Therapy/Topic:  Group Therapy:  Emotion Regulation  Participation Level:  Minimal   Mood:pleasant  Description of Group:    The purpose of this group is to assist patients in learning to regulate negative emotions and experience positive emotions. Patients will be guided to discuss ways in which they have been vulnerable to their negative emotions. These vulnerabilities will be juxtaposed with experiences of positive emotions or situations, and patients challenged to use positive emotions to combat negative ones. Special emphasis will be placed on coping with negative emotions in conflict situations, and patients will process healthy conflict resolution skills.  Therapeutic Goals: 1. Patient will identify two positive emotions or experiences to reflect on in order to balance out negative emotions:  2. Patient will label two or more emotions that they find the most difficult to experience:  3. Patient will be able to demonstrate positive conflict resolution skills through discussion or role plays:   Summary of Patient Progress:Pt shared that anger is an emotion that is difficult for her to experience.  Pt did not participate in group discussion regarding positive ways to deal with difficult emotions but did appear to be paying attention.        Therapeutic Modalities:   Cognitive Behavioral Therapy Feelings Identification Dialectical Behavioral Therapy  Lurline Idol, LCSW

## 2017-11-10 NOTE — Progress Notes (Addendum)
[facility] MD Progress Note  11/10/2017 2:29 PM Crystal Duarte  MRN:  [id number]   Subjective: I been here a week, not many changes. They are discussing ECT which I have done before. I meet with my son later to come up with a plan to stay safe.   Objective: I have discussed case with treatment team and have met with patient. 57 year old female, reports history of depression, has been diagnosed with Major Depressive Disorder.  She was admitted due to suicidal attempt/overdose, which she states was planned.  Patient continues to present as depressed, flat, with no motivation. She is trying very hard to participate and become more engaged with her peers. She wants to She denies any disturbances in her sleep and or eating at this time.  " I dont want to gain 10 lbs this time Im here. " Denies suicidal ideations at this time. No disruptive or agitated behaviors on unit.  Somatic concerns as above . Of note, patient had been started on antibiotic and Tessalon/Mucinex due to upper respiratory infection, which is now improved. She now reports taht the medication has given her a yeast infections. She is requesting a rx for Diflucan. Currently reports symptoms such as  postnasal drip/ odynophagia resolved and denies shortness of breath or current cough. Visible on unit, going to groups, behavior on unit in good control. Labs reviewed- TSH WNL 2.197  Principal Problem: MDD Diagnosis:   Patient Active Problem List   Diagnosis Date Noted  . Severe episode of recurrent major depressive disorder, without psychotic features ([place]) [F33.2] 07/18/2017  . DOE (dyspnea on exertion) [R06.09] 12/26/2016  . COPD GOLD II  [J44.9] 12/26/2016  . Orthostatic hypotension [I95.1] 12/14/2016  . Chest tightness [R07.89] 12/14/2016  . Weight loss [R63.4] 12/14/2016  . Routine general medical examination at a health care facility [Z00.00] 10/09/2015  . 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 Hx of adenomatous polyp of colon  [Z86.010] 12/04/2014  . Memory loss [R41.3] 10/04/2014  . Affective bipolar disorder ([place]) [F31.9] 03/12/2014  . Peripheral vascular disease, unspecified ([place]) [I73.9] 10/10/2013  . Smoker [F17.200] 08/28/2012  . Menopause [Z78.0] 08/28/2012  . Multiple pulmonary nodules [R91.8] 11/12/2010   Total Time spent with patient: 20 minutes  Past Psychiatric History: See admission H&P  Past Medical History:  Past Medical History:  Diagnosis Date  . Coccidioidomycosis, pulmonary ([place])   . Depression   . Hx of adenomatous polyp of colon 12/04/2014  . Osteopenia 11/2016   T score -2.2 FRAX 17%/2.4%  . Panic attack     Past Surgical History:  Procedure Laterality Date  . ABDOMINAL HYSTERECTOMY    . COLONOSCOPY  2001   hemorrhoidectomy  . LUNG SURGERY  2001   VATS surgery   . OVARIAN CYST REMOVAL  1970's  . VESICOVAGINAL FISTULA CLOSURE W/ TAH  2005   Family History:  Family History  Problem Relation Age of Onset  . Asthma Mother   . Ovarian cancer Mother   . Cancer Mother        OVARIAN  . Varicose Veins Mother   . Heart disease Father   . Peripheral vascular disease Father   . Cancer Maternal Grandmother        LUNG- LUNG  . Colon cancer Neg Hx    Family Psychiatric  History: See admission H&P Social History:  Social History   Substance and Sexual Activity  Alcohol Use No  . Alcohol/week: 0.0 standard drinks     Social History  Substance and Sexual Activity  Drug Use No    Social History   Socioeconomic History  . Marital status: Divorced    Spouse name: Not on file  . Number of children: 2  . Years of education: Not on file  . Highest education level: Not on file  Occupational History  . Occupation: Forensic psychologist: [employer]  . Financial resource strain: Not on file  . Food insecurity:    Worry: Not on file    Inability: Not on file  . Transportation needs:    Medical: Not on file    Non-medical: Not on file  Tobacco  Use  . Smoking status: Current Some Day Smoker    Packs/day: 1.00    Years: 42.00    Pack years: 42.00    Types: Cigarettes    Last attempt to quit: 09/25/2016    Years since quitting: 1.1  . Smokeless tobacco: Former Systems developer    Quit date: 06/13/2016  Substance and Sexual Activity  . Alcohol use: No    Alcohol/week: 0.0 standard drinks  . Drug use: No  . Sexual activity: Not on file  Lifestyle  . Physical activity:    Days per week: Not on file    Minutes per session: Not on file  . Stress: Not on file  Relationships  . Social connections:    Talks on phone: Not on file    Gets together: Not on file    Attends religious service: Not on file    Active member of club or organization: Not on file    Attends meetings of clubs or organizations: Not on file    Relationship status: Not on file  Other Topics Concern  . Not on file  Social History Narrative  . Not on file   Additional Social History:   Sleep: Improving  Appetite:  improving  Current Medications: Current Facility-Administered Medications  Medication Dose Route Frequency Provider Last Rate Last Dose  . acetaminophen (TYLENOL) tablet 650 mg  650 mg Oral Q6H PRN Ethelene Hal, NP   650 mg at 11/09/17 2257  . acidophilus (RISAQUAD) capsule 1 capsule  1 capsule Oral Daily Sharma Covert, MD   1 capsule at 11/10/17 [phone]  . alum & mag hydroxide-simeth (MAALOX/MYLANTA) 200-200-20 MG/5ML suspension 30 mL  30 mL Oral Q4H PRN Ethelene Hal, NP      . busPIRone (BUSPAR) tablet 15 mg  15 mg Oral TID Sharma Covert, MD   15 mg at 11/10/17 1150  . clotrimazole (GYNE-LOTRIMIN) vaginal cream 1 Applicatorful  1 Applicatorful Vaginal QHS Laverle Hobby, PA-C   1 Applicatorful at 51/76/16 2116  . diclofenac sodium (VOLTAREN) 1 % transdermal gel 2 g  2 g Topical QID Sharma Covert, MD   2 g at 11/10/17 1151  . gabapentin (NEURONTIN) capsule 400 mg  400 mg Oral TID Minda Ditto, RPH   400 mg at 11/10/17  1150  . hydrOXYzine (ATARAX/VISTARIL) tablet 25 mg  25 mg Oral TID PRN Ethelene Hal, NP   25 mg at 11/09/17 2117  . magnesium hydroxide (MILK OF MAGNESIA) suspension 30 mL  30 mL Oral Daily PRN Ethelene Hal, NP      . pneumococcal 23 valent vaccine (PNU-IMMUNE) injection 0.5 mL  0.5 mL Intramuscular Tomorrow-1000 Sharma Covert, MD      . propranolol (INDERAL) tablet 20 mg  20 mg Oral TID Sharma Covert,  MD   20 mg at 11/10/17 1150  . traZODone (DESYREL) tablet 200 mg  200 mg Oral QHS Sharma Covert, MD   200 mg at 11/09/17 2117  . vortioxetine HBr (TRINTELLIX) tablet 10 mg  10 mg Oral Daily Sharma Covert, MD   10 mg at 11/10/17 2778    Lab Results:  Results for orders placed or performed during the hospital encounter of 11/03/17 (from the past 48 hour(s))  TSH     Status: None   Collection Time: 11/09/17  6:43 AM  Result Value Ref Range   TSH 2.197 0.350 - 4.500 uIU/mL    Comment: Performed by a 3rd Generation assay with a functional sensitivity of <=0.01 uIU/mL. Performed at [facility], [place] [address], [place], [place] [address]     Blood Alcohol level:  Lab Results  Component Value Date   ETH <10 11/02/2017   ETH <10 36/14/4315    Metabolic Disorder Labs: Lab Results  Component Value Date   HGBA1C 5.4 07/21/2017   MPG 108.28 07/21/2017   MPG 114 08/28/2012   No results found for: PROLACTIN Lab Results  Component Value Date   CHOL 200 07/21/2017   TRIG 208 (H) 07/21/2017   HDL 54 07/21/2017   CHOLHDL 3.7 07/21/2017   VLDL 42 (H) 07/21/2017   LDLCALC 104 (H) 07/21/2017   LDLCALC 119 (H) 10/08/2015   Musculoskeletal: Strength & Muscle Tone: within normal limits Gait & Station: normal Patient leans: N/A  Psychiatric Specialty Exam: Physical Exam  Nursing note and vitals reviewed. Constitutional: She is oriented to person, place, and time. She appears well-developed and well-nourished.  HENT:  Head:  Normocephalic and atraumatic.  Respiratory: Effort normal.  Neurological: She is alert and oriented to person, place, and time.    ROS  reports dry mouth ,no chest pain, no shortness of breath, no nausea, no vomiting, describes some GU discomfort, feels she may have a "yeast infection".  Blood pressure (!) 141/83, pulse 67, temperature 98 F (36.7 C), temperature source Oral, resp. rate 16, height '5\' 4"'$  (1.626 m), weight 76.7 kg, SpO2 97 %.Body mass index is 29.01 kg/m.  General Appearance: Fairly Groomed  Eye Contact:  Good  Speech:  Normal Rate  Volume:  Normal  Mood:  remains depressed, but today endorses partial improvement  Affect:  constricted, less irritable, smiles briefly at times   Thought Process:  Linear and Descriptions of Associations: Intact  Orientation:  Full (Time, Place, and Person)  Thought Content:  No hallucinations, no delusions expressed  Suicidal Thoughts:  No-denies suicidal plan or intention, contracts for safety at this time  Homicidal Thoughts:  No  Memory:  Recent and remote grossly intact  Judgement:  Other:  improving   Insight:  Present  Psychomotor Activity:  Normal  Concentration:  Concentration: Good and Attention Span: Good  Recall:  Good  Fund of Knowledge:  Good  Language:  Good  Akathisia:  Negative  Handed:  Right  AIMS (if indicated):     Assets:  Desire for Improvement Financial Resources/Insurance Housing Resilience Social Support Talents/Skills  ADL's:  Intact  Cognition:  WNL  Sleep:  Number of Hours: 6.75    Assessment -57 year old female with a history of chronic depression, carries diagnoses of major depressive disorder, status post suicidal attempt by overdose.  Patient remains depressed, constricted, but today reports she is starting to feel better and that she notes  some improvement in mood . Denies SI at  this time. She is tolerating medications well at present. Current plan is for patient to transfer to [facility] in order to start inpatient ECT, which has been helpful in the past, but there has been no bed availability thus far .    Treatment Plan Summary: Treatment plan reviewed as below today August 8 Treatment team working on disposition planning-current plan is to transfer to [facility] with the purpose of starting inpatient ECT.  CSW/staff continues to call in daily  with patient's consent, to check in on bed availability Continue Trintellix  10 mg daily for depression  Continue Trazodone 200 mg nightly for insomnia Continue Propranolol 20 mg 3 times daily for anxiety Continue BuSpar 15 mg 3 times daily for anxiety Continue Neurontin 400 mg 3 times daily for anxiety/pain Continue Vistaril 25 mg every 8 hours PRN for anxiety as needed Discontinue Tessalon, Mucinex, Augmentin. Clotrimazole cream to address GU symptoms, will discontinue at this time and start diflucan.   Nanci Pina, FNP 11/10/2017, 2:29 PM   ..Agree with NP Progress Note

## 2017-11-10 NOTE — Tx Team (Signed)
Interdisciplinary Treatment and Diagnostic Plan Update  11/10/2017 Time of Session: 5732KG Crystal Duarte MRN: [id number]  Principal Diagnosis: <principal problem not specified>  Secondary Diagnoses: Active Problems:   Severe episode of recurrent major depressive disorder, without psychotic features ([place])   Current Medications:  Current Facility-Administered Medications  Medication Dose Route Frequency Provider Last Rate Last Dose  . acetaminophen (TYLENOL) tablet 650 mg  650 mg Oral Q6H PRN Ethelene Hal, NP   650 mg at 11/09/17 2257  . acidophilus (RISAQUAD) capsule 1 capsule  1 capsule Oral Daily Sharma Covert, MD   1 capsule at 11/10/17 [phone]  . alum & mag hydroxide-simeth (MAALOX/MYLANTA) 200-200-20 MG/5ML suspension 30 mL  30 mL Oral Q4H PRN Ethelene Hal, NP      . busPIRone (BUSPAR) tablet 15 mg  15 mg Oral TID Sharma Covert, MD   15 mg at 11/10/17 1150  . clotrimazole (GYNE-LOTRIMIN) vaginal cream 1 Applicatorful  1 Applicatorful Vaginal QHS Laverle Hobby, PA-C   1 Applicatorful at 31/51/76 2116  . diclofenac sodium (VOLTAREN) 1 % transdermal gel 2 g  2 g Topical QID Sharma Covert, MD   2 g at 11/10/17 1151  . gabapentin (NEURONTIN) capsule 400 mg  400 mg Oral TID Minda Ditto, RPH   400 mg at 11/10/17 1150  . hydrOXYzine (ATARAX/VISTARIL) tablet 25 mg  25 mg Oral TID PRN Ethelene Hal, NP   25 mg at 11/09/17 2117  . magnesium hydroxide (MILK OF MAGNESIA) suspension 30 mL  30 mL Oral Daily PRN Ethelene Hal, NP      . pneumococcal 23 valent vaccine (PNU-IMMUNE) injection 0.5 mL  0.5 mL Intramuscular Tomorrow-1000 Sharma Covert, MD      . propranolol (INDERAL) tablet 20 mg  20 mg Oral TID Sharma Covert, MD   20 mg at 11/10/17 1150  . traZODone (DESYREL) tablet 200 mg  200 mg Oral QHS Sharma Covert, MD   200 mg at 11/09/17 2117  . vortioxetine HBr (TRINTELLIX) tablet 10 mg  10 mg Oral Daily Sharma Covert, MD   10 mg  at 11/10/17 1607   PTA Medications: Medications Prior to Admission  Medication Sig Dispense Refill Last Dose  . acetaminophen (TYLENOL) 500 MG tablet Take 2 tablets (1,000 mg total) by mouth every 6 (six) hours as needed. 30 tablet 0 Past Week at Unknown time  . albuterol (PROVENTIL HFA;VENTOLIN HFA) 108 (90 Base) MCG/ACT inhaler Inhale 2 puffs into the lungs every 4 (four) hours as needed for wheezing or shortness of breath. 1 Inhaler 0 Past Week at Unknown time  . busPIRone (BUSPAR) 15 MG tablet Take 15 mg by mouth 3 (three) times daily.   Past Week at Unknown time  . DULoxetine (CYMBALTA) 60 MG capsule Take 1 capsule (60 mg total) by mouth 2 (two) times daily. 60 capsule 0 Past Week at Unknown time  . fluticasone (FLONASE) 50 MCG/ACT nasal spray Place 2 sprays into both nostrils daily. (Patient not taking: Reported on 11/02/2017) 16 g 2 Not Taking at Unknown time  . gabapentin (NEURONTIN) 400 MG capsule Take 1 capsule (400 mg total) by mouth 3 (three) times daily. (Patient not taking: Reported on 11/02/2017) 90 capsule 0 Not Taking at Unknown time  . naproxen (NAPROSYN) 500 MG tablet Take 1 tablet (500 mg total) by mouth 2 (two) times daily. (Patient not taking: Reported on 11/02/2017) 30 tablet 0 Not Taking at Unknown time  . Naproxen  Sod-diphenhydrAMINE (ALEVE PM PO) Take 15 tablets by mouth once.   Past Week at Unknown time  . orphenadrine (NORFLEX) 100 MG tablet Take 1 tablet (100 mg total) by mouth 2 (two) times daily. (Patient not taking: Reported on 11/02/2017) 30 tablet 0 Not Taking at Unknown time  . oxyCODONE-acetaminophen (PERCOCET/ROXICET) 5-325 MG tablet Take 1 tablet by mouth every 6 (six) hours as needed for severe pain. (Patient not taking: Reported on 11/02/2017) 12 tablet 0 Not Taking at Unknown time  . propranolol (INDERAL) 20 MG tablet Take 20 mg by mouth 3 (three) times daily.   Past Week at Unknown time  . QUEtiapine (SEROQUEL) 300 MG tablet Take 1 tablet (300 mg total) by mouth at  bedtime. (Patient not taking: Reported on 11/02/2017) 30 tablet 0 Not Taking at Unknown time  . traMADol (ULTRAM) 50 MG tablet 2 tablets every 6 hours may be taken in combination with acetaminophen and naproxen. (Patient not taking: Reported on 11/02/2017) 20 tablet 0 Not Taking at Unknown time  . traZODone (DESYREL) 100 MG tablet Take 1 tablet (100 mg total) by mouth at bedtime as needed and may repeat dose one time if needed for sleep. 30 tablet 0 11/01/2017 at Unknown time    Patient Stressors: Financial difficulties Health problems  Patient Strengths: Ability for insight Average or above average intelligence Capable of independent living Communication skills Supportive family/friends  Treatment Modalities: Medication Management, Group therapy, Case management,  1 to 1 session with clinician, Psychoeducation, Recreational therapy.   Physician Treatment Plan for Primary Diagnosis: <principal problem not specified> Long Term Goal(s): Improvement in symptoms so as ready for discharge Improvement in symptoms so as ready for discharge   Short Term Goals: Ability to identify changes in lifestyle to reduce recurrence of condition will improve Ability to verbalize feelings will improve Ability to disclose and discuss suicidal ideas Ability to demonstrate self-control will improve Ability to identify and develop effective coping behaviors will improve Ability to maintain clinical measurements within normal limits will improve Ability to identify changes in lifestyle to reduce recurrence of condition will improve Ability to verbalize feelings will improve Ability to disclose and discuss suicidal ideas Ability to demonstrate self-control will improve Ability to identify and develop effective coping behaviors will improve  Medication Management: Evaluate patient's response, side effects, and tolerance of medication regimen.  Therapeutic Interventions: 1 to 1 sessions, Unit Group sessions and  Medication administration.  Evaluation of Outcomes: Progressing  Physician Treatment Plan for Secondary Diagnosis: Active Problems:   Severe episode of recurrent major depressive disorder, without psychotic features ([place])  Long Term Goal(s): Improvement in symptoms so as ready for discharge Improvement in symptoms so as ready for discharge   Short Term Goals: Ability to identify changes in lifestyle to reduce recurrence of condition will improve Ability to verbalize feelings will improve Ability to disclose and discuss suicidal ideas Ability to demonstrate self-control will improve Ability to identify and develop effective coping behaviors will improve Ability to maintain clinical measurements within normal limits will improve Ability to identify changes in lifestyle to reduce recurrence of condition will improve Ability to verbalize feelings will improve Ability to disclose and discuss suicidal ideas Ability to demonstrate self-control will improve Ability to identify and develop effective coping behaviors will improve     Medication Management: Evaluate patient's response, side effects, and tolerance of medication regimen.  Therapeutic Interventions: 1 to 1 sessions, Unit Group sessions and Medication administration.  Evaluation of Outcomes: Progressing   RN Treatment Plan  for Primary Diagnosis: <principal problem not specified> Long Term Goal(s): Knowledge of disease and therapeutic regimen to maintain health will improve  Short Term Goals: Ability to disclose and discuss suicidal ideas, Ability to identify and develop effective coping behaviors will improve and Compliance with prescribed medications will improve  Medication Management: RN will administer medications as ordered by provider, will assess and evaluate patient's response and provide education to patient for prescribed medication. RN will report any adverse and/or side effects to prescribing provider.  Therapeutic  Interventions: 1 on 1 counseling sessions, Psychoeducation, Medication administration, Evaluate responses to treatment, Monitor vital signs and CBGs as ordered, Perform/monitor CIWA, COWS, AIMS and Fall Risk screenings as ordered, Perform wound care treatments as ordered.  Evaluation of Outcomes: Progressing   LCSW Treatment Plan for Primary Diagnosis: <principal problem not specified> Long Term Goal(s): Safe transition to appropriate next level of care at discharge, Engage patient in therapeutic group addressing interpersonal concerns.  Short Term Goals: Engage patient in aftercare planning with referrals and resources  Therapeutic Interventions: Assess for all discharge needs, 1 to 1 time with Social worker, Explore available resources and support systems, Assess for adequacy in community support network, Educate family and significant other(s) on suicide prevention, Complete Psychosocial Assessment, Interpersonal group therapy.  Evaluation of Outcomes: Progressing   Progress in Treatment: Attending groups: Yes Participating in groups: No. Taking medication as prescribed: Yes. Toleration medication: Yes. Family/Significant other contact made: Yes, individual(s) contacted:  son Patient understands diagnosis: Yes. Discussing patient identified problems/goals with staff: Yes. Medical problems stabilized or resolved: Yes. Denies suicidal/homicidal ideation: Yes. Issues/concerns per patient self-inventory: No. Other:   New problem(s) identified: None  New Short Term/Long Term Goal(s):medication stabilization, elimination of SI thoughts, development of comprehensive mental wellness plan.    Patient Goals:   I want to get out of this hopelessness  Discharge Plan or Barriers: Patient wants to discharge to the care for [facility] for ECT. CSW will continue to assess for appropriate referrals and additional discharge planning.   Reason for Continuation of Hospitalization:  Depression Medication stabilization Suicidal ideation  Estimated Length of Stay:2-4 days   Attendees: Patient: 11/10/2017   Physician: Dr. Parke Poisson, MD 11/10/2017   Nursing: Neldon Newport, RN 11/10/2017   RN Care Manager: 11/10/2017   Social Worker: Lurline Idol, LCSW 11/10/2017   Recreational Therapist:  11/10/2017   Other:  11/10/2017   Other:  11/10/2017   Other: 11/10/2017        Scribe for Treatment Team: Joanne Chars, LCSW 11/10/2017 1:15 PM

## 2017-11-10 NOTE — Progress Notes (Signed)
Pt was observed in the dayroom, attending wrap-up group. Pt appears anxious in affect and mood.Pt denies SI/HI/AVH/Pain at this time. Pt state she had a good day. Pt states her son came to visit and was supportive. PRN vistaril requested and given. Support and encouragement provided. Will continue with POC.

## 2017-11-10 NOTE — Progress Notes (Signed)
CSW met with pt to discuss possible plan for outpt ECT.  CSW suggested possibly attending the day program at [facility].  Pt said that she did not want to do that and that if she was discharged she would just get her son to take her to the ED at [facility] so she could try to pursue admission there for ECT.  As CSW tried to explore with pt about possible other plans rather than staying inpt, CSW mentioned insurance authorization issues, which immediately made pt very anxious.  Pt then said she needs to discharge immediately.  CSW later called to lobby to meet with pt son Crystal Duarte.  Pt had called him.  We discussed current status with possible discharge plans and he is still concerned that pt is not safe.  He is also in contact with [facility] about possibly moving up ECT start date.  He asked for visit--CSW spoke to nursing and due to lunch hour it was not possible for visit to occur right now.  Crystal Duarte understood and said he would return tonight.  Pt informed as well. Winferd Humphrey, MSW, LCSW Clinical Social Worker 11/10/2017 12:18 PM

## 2017-11-11 MED ORDER — TRAZODONE HCL 100 MG PO TABS
100.0000 mg | ORAL_TABLET | Freq: Every evening | ORAL | Status: DC | PRN
  Administered 2017-11-11 – 2017-11-12 (×2): 100 mg via ORAL
  Filled 2017-11-11 (×2): qty 1

## 2017-11-11 MED ORDER — VORTIOXETINE HBR 5 MG PO TABS
15.0000 mg | ORAL_TABLET | Freq: Every day | ORAL | Status: DC
  Administered 2017-11-12 – 2017-11-16 (×5): 15 mg via ORAL
  Filled 2017-11-11 (×7): qty 1

## 2017-11-11 NOTE — Plan of Care (Signed)
  Problem: Education: Goal: Knowledge of [facility] Education information/materials will improve Outcome: Progressing   Problem: Activity: Goal: Sleeping patterns will improve Outcome: Progressing   Problem: Coping: Goal: Ability to verbalize frustrations and anger appropriately will improve Outcome: Progressing

## 2017-11-11 NOTE — [facility] Group Notes (Signed)
[facility] Group Notes: (Clinical Social Work)   11/11/2017      Type of Therapy:  Group Therapy   Participation Level:  Did Not Attend despite MHT prompting   Selmer Dominion, LCSW 11/11/2017, 1:32 PM

## 2017-11-11 NOTE — Progress Notes (Addendum)
[facility] MD Progress Note  11/11/2017 12:57 PM Saori Umholtz  MRN:  [id number]   Subjective: Patient acknowledges partially improved mood and states "I do think I am slowly getting better".  She does, however, continue to report depression, anxiety, and states that she does not feel safe to consider discharge at this time.  States her son has offered her to go live with him after discharge which she appreciates but states" what if I end up to doing something stupid or killing myself at his home?". States she does not have a current plan or intention of hurting herself, but fears depression may worsen once discharged. She continues to express interest in ECT.   Objective: I have discussed case with treatment team and have met with patient. 57 year old female, long history of severe depression, recent suicidal attempt by overdose.  Had been planning suicide prior to admission by putting financial affairs in order. Since admission has improved partially/gradually.  She does acknowledge feeling better than she did prior to admission and she does present with a more reactive affect, smiles briefly at times, appropriately. Currently denies suicidal plan or intention, but expresses feeling unsafe to discharge at this time and as above expresses concerns that she may attempt suicide again.  Her family is supportive, son and daughter have been involved in her care.  As per patient and social worker son has offered for patient to live with him after discharge for added support and to provide transportation, etc. for outpatient ECT.  Patient does express interest in ECT.  Dr. Weber Cooks from [facility] has been out of town (he provides inpatient ECT for our hospital) and no bed has been available yet at [facility] for inpatient ECT course there. No disruptive or agitated behaviors on unit.  Some group participation. Currently does not endorse medication side effects. Currently denies suicidal plan or intention and  contracts for safety on unit.  Principal Problem: MDD Diagnosis:   Patient Active Problem List   Diagnosis Date Noted  . Severe episode of recurrent major depressive disorder, without psychotic features ([place]) [F33.2] 07/18/2017  . DOE (dyspnea on exertion) [R06.09] 12/26/2016  . COPD GOLD II  [J44.9] 12/26/2016  . Orthostatic hypotension [I95.1] 12/14/2016  . Chest tightness [R07.89] 12/14/2016  . Weight loss [R63.4] 12/14/2016  . Routine general medical examination at a health care facility [Z00.00] 10/09/2015  . 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 Hx of adenomatous polyp of colon [Z86.010] 12/04/2014  . Memory loss [R41.3] 10/04/2014  . Affective bipolar disorder ([place]) [F31.9] 03/12/2014  . Peripheral vascular disease, unspecified ([place]) [I73.9] 10/10/2013  . Smoker [F17.200] 08/28/2012  . Menopause [Z78.0] 08/28/2012  . Multiple pulmonary nodules [R91.8] 11/12/2010   Total Time spent with patient: 20 minutes  Past Psychiatric History: See admission H&P  Past Medical History:  Past Medical History:  Diagnosis Date  . Coccidioidomycosis, pulmonary ([place])   . Depression   . Hx of adenomatous polyp of colon 12/04/2014  . Osteopenia 11/2016   T score -2.2 FRAX 17%/2.4%  . Panic attack     Past Surgical History:  Procedure Laterality Date  . ABDOMINAL HYSTERECTOMY    . COLONOSCOPY  2001   hemorrhoidectomy  . LUNG SURGERY  2001   VATS surgery   . OVARIAN CYST REMOVAL  1970's  . VESICOVAGINAL FISTULA CLOSURE W/ TAH  2005   Family History:  Family History  Problem Relation Age of Onset  . Asthma Mother   . Ovarian cancer Mother   .  Cancer Mother        OVARIAN  . Varicose Veins Mother   . Heart disease Father   . Peripheral vascular disease Father   . Cancer Maternal Grandmother        LUNG- LUNG  . Colon cancer Neg Hx    Family Psychiatric  History: See admission H&P Social History:  Social History   Substance and Sexual Activity  Alcohol Use No  .  Alcohol/week: 0.0 standard drinks     Social History   Substance and Sexual Activity  Drug Use No    Social History   Socioeconomic History  . Marital status: Divorced    Spouse name: Not on file  . Number of children: 2  . Years of education: Not on file  . Highest education level: Not on file  Occupational History  . Occupation: Forensic psychologist: [employer]  . Financial resource strain: Not on file  . Food insecurity:    Worry: Not on file    Inability: Not on file  . Transportation needs:    Medical: Not on file    Non-medical: Not on file  Tobacco Use  . Smoking status: Current Some Day Smoker    Packs/day: 1.00    Years: 42.00    Pack years: 42.00    Types: Cigarettes    Last attempt to quit: 09/25/2016    Years since quitting: 1.1  . Smokeless tobacco: Former Systems developer    Quit date: 06/13/2016  Substance and Sexual Activity  . Alcohol use: No    Alcohol/week: 0.0 standard drinks  . Drug use: No  . Sexual activity: Not on file  Lifestyle  . Physical activity:    Days per week: Not on file    Minutes per session: Not on file  . Stress: Not on file  Relationships  . Social connections:    Talks on phone: Not on file    Gets together: Not on file    Attends religious service: Not on file    Active member of club or organization: Not on file    Attends meetings of clubs or organizations: Not on file    Relationship status: Not on file  Other Topics Concern  . Not on file  Social History Narrative  . Not on file   Additional Social History:   Sleep: Fair  Appetite:  improving  Current Medications: Current Facility-Administered Medications  Medication Dose Route Frequency Provider Last Rate Last Dose  . acetaminophen (TYLENOL) tablet 650 mg  650 mg Oral Q6H PRN Ethelene Hal, NP   650 mg at 11/09/17 2257  . acidophilus (RISAQUAD) capsule 1 capsule  1 capsule Oral Daily Sharma Covert, MD   1 capsule at 11/11/17 [phone]   . alum & mag hydroxide-simeth (MAALOX/MYLANTA) 200-200-20 MG/5ML suspension 30 mL  30 mL Oral Q4H PRN Ethelene Hal, NP      . busPIRone (BUSPAR) tablet 15 mg  15 mg Oral TID Sharma Covert, MD   15 mg at 11/11/17 1155  . diclofenac sodium (VOLTAREN) 1 % transdermal gel 2 g  2 g Topical QID Sharma Covert, MD   2 g at 11/10/17 1711  . gabapentin (NEURONTIN) capsule 400 mg  400 mg Oral TID Minda Ditto, RPH   400 mg at 11/11/17 1155  . hydrOXYzine (ATARAX/VISTARIL) tablet 25 mg  25 mg Oral TID PRN Ethelene Hal, NP   25 mg at  11/10/17 2212  . magnesium hydroxide (MILK OF MAGNESIA) suspension 30 mL  30 mL Oral Daily PRN Ethelene Hal, NP      . pneumococcal 23 valent vaccine (PNU-IMMUNE) injection 0.5 mL  0.5 mL Intramuscular Tomorrow-1000 Sharma Covert, MD      . propranolol (INDERAL) tablet 20 mg  20 mg Oral TID Sharma Covert, MD   20 mg at 11/11/17 1155  . traZODone (DESYREL) tablet 200 mg  200 mg Oral QHS Sharma Covert, MD   200 mg at 11/10/17 2212  . vortioxetine HBr (TRINTELLIX) tablet 10 mg  10 mg Oral Daily Sharma Covert, MD   10 mg at 11/11/17 1025    Lab Results:  No results found for this or any previous visit (from the past 84 hour(s)).  Blood Alcohol level:  Lab Results  Component Value Date   ETH <10 11/02/2017   ETH <10 85/27/7824    Metabolic Disorder Labs: Lab Results  Component Value Date   HGBA1C 5.4 07/21/2017   MPG 108.28 07/21/2017   MPG 114 08/28/2012   No results found for: PROLACTIN Lab Results  Component Value Date   CHOL 200 07/21/2017   TRIG 208 (H) 07/21/2017   HDL 54 07/21/2017   CHOLHDL 3.7 07/21/2017   VLDL 42 (H) 07/21/2017   LDLCALC 104 (H) 07/21/2017   LDLCALC 119 (H) 10/08/2015   Musculoskeletal: Strength & Muscle Tone: within normal limits Gait & Station: normal Patient leans: N/A  Psychiatric Specialty Exam: Physical Exam  Nursing note and vitals reviewed. Constitutional: She is  oriented to person, place, and time. She appears well-developed and well-nourished.  HENT:  Head: Normocephalic and atraumatic.  Respiratory: Effort normal.  Neurological: She is alert and oriented to person, place, and time.    ROS reports dry mouth ,no chest pain, no shortness of breath, no nausea, no vomiting, describes improved genitourinary discomfort following Diflucan management.currently denies dysuria or urgency, no fever  Blood pressure (!) 141/68, pulse 84, temperature 98.7 F (37.1 C), temperature source Oral, resp. rate 16, height '5\' 4"'$  (1.626 m), weight 76.7 kg, SpO2 97 %.Body mass index is 29.01 kg/m.  General Appearance: Improving grooming  Eye Contact:  Good  Speech:  Normal Rate  Volume:  Normal  Mood:  Partially improved mood but remains depressed and anxious  Affect:  More reactive, smiles at times appropriately, but remains constricted overall and anxious  Thought Process:  Linear and Descriptions of Associations: Intact  Orientation:  Full (Time, Place, and Person)  Thought Content:  No hallucinations, no delusions expressed  Suicidal Thoughts:  No-currently denies suicidal plan or intention but states she does not feel safe and considering discharge yet and expresses concern that she might attempt to complete suicide following discharge-see above  Homicidal Thoughts:  No  Memory:  Recent and remote grossly intact  Judgement:  Other:  improving   Insight:  Present  Psychomotor Activity:  Normal  Concentration:  Concentration: Good and Attention Span: Good  Recall:  Good  Fund of Knowledge:  Good  Language:  Good  Akathisia:  Negative  Handed:  Right  AIMS (if indicated):     Assets:  Desire for Improvement Financial Resources/Insurance Housing Resilience Social Support Talents/Skills  ADL's:  Intact  Cognition:  WNL  Sleep:  Number of Hours: 3    Assessment -57 year old female with a history of chronic depression, carries diagnoses of major depressive  disorder, status post suicidal attempt by overdose.  Patient  is improving gradually compared to admission presentation and is noticed to resent with a more reactive affect, less negative ruminations, better rate of speech.  She is tolerating medications well.  Although denies current suicidal plan or intention and appears more future oriented reports apprehension about discharging and concerned that she would be unsafe and might attempt suicide.  She is thinking of moving in with her son after discharge for added support and for outpatient ECT support (transportation to and from ECT).    Treatment Plan Summary: Treatment plan reviewed as below today August 10 Treatment team working on disposition planning-plan has been to work on transferring to  [facility] for inpatient ECT once bed is available. Increase Trintellix  To 15  mg daily for depression  Adjust Trazodone to 100 mg QHS PRN  for insomnia  Continue Propranolol 20 mg 3 times daily for anxiety Continue BuSpar 15 mg 3 times daily for anxiety Continue Neurontin 400 mg 3 times daily for anxiety/pain Continue Vistaril 25 mg every 8 hours PRN for anxiety as needed   Jenne Campus, MD 11/11/2017, 12:57 PM   Patient ID: Gardiner Ramus, female   DOB: February 29, 1961, 57 y.o.   MRN: [id number]

## 2017-11-11 NOTE — [facility] Group Notes (Signed)
[facility] Group Notes:  (Nursing/MHT/Case Management/Adjunct)  Date:  11/11/2017  Time:  1:57 PM  Type of Therapy:  Psychoeducational Skills  Participation Level:  Active  Participation Quality:  Appropriate  Affect:  Appropriate  Cognitive:  Appropriate  Insight:  Appropriate  Engagement in Group:  Engaged  Modes of Intervention:  Problem-solving  Summary of Progress/Problems: Pt attended Psychoeducational group with top topic anger management.   [name] 11/11/2017, 1:57 PM

## 2017-11-11 NOTE — Progress Notes (Addendum)
Pt was observed in the dayroom, putting together a puzzle. Pt attended wrap-up group. Pt appears anxious in affect and mood.Pt denies SI/HI/AVH/Pain at this time. Pt states she hopes to continue with ECT; waiting for re-eval consult. Pt states "I'm finally feeling like myself; the clouding is going away". Pt c/o of left sided swelling; mainly in lower leg. Pt was given a pillow to prop leg up.Pt states she was hoping to talk to provider about adding hydrochlorizide to med regimen. PRN vistaril, trazodone, and tylenol requested and given. Support and encouragement provided. Will continue with POC.

## 2017-11-11 NOTE — Progress Notes (Addendum)
D Pt is observed OOB UAL on the 400 hall today. She is dressed appropriately. Her affect is flat, depressed, her movements are slow and methodical. She makes good eye contact. She is engaged in her poc as evidenced by her questions and conversation with  This wirter; she demonstrates forward thinking " I think I feel a little better".     A SHe completes her daily assessment and on this she wrote she deneid SI today and she rated her depression, hopelessness and anxiety "7/7/0" respectively. She attended her Life SKills group today, was engaged in the discussiion , and is present on the unit. She cont to pursue ECT treatment.     R Safety is in place.

## 2017-11-11 NOTE — Plan of Care (Signed)
  Problem: Education: Goal: Knowledge of [facility] Education information/materials will improve Outcome: Progressing Goal: Mental status will improve Outcome: Progressing Goal: Verbalization of understanding the information provided will improve Outcome: Progressing   Problem: Activity: Goal: Interest or engagement in activities will improve Outcome: Progressing Goal: Sleeping patterns will improve Outcome: Progressing   Problem: Coping: Goal: Ability to verbalize frustrations and anger appropriately will improve Outcome: Progressing

## 2017-11-11 NOTE — Plan of Care (Signed)
  Problem: Activity: Goal: Sleeping patterns will improve Outcome: Progressing   Problem: Coping: Goal: Ability to verbalize frustrations and anger appropriately will improve Outcome: Progressing   

## 2017-11-12 MED ORDER — TRAZODONE HCL 100 MG PO TABS
200.0000 mg | ORAL_TABLET | Freq: Every evening | ORAL | Status: DC | PRN
  Administered 2017-11-13 – 2017-11-15 (×3): 200 mg via ORAL
  Filled 2017-11-12 (×3): qty 2

## 2017-11-12 MED ORDER — TRAZODONE HCL 100 MG PO TABS
100.0000 mg | ORAL_TABLET | Freq: Once | ORAL | Status: AC
  Administered 2017-11-12: 100 mg via ORAL
  Filled 2017-11-12 (×2): qty 1

## 2017-11-12 MED ORDER — LIOTHYRONINE SODIUM 25 MCG PO TABS
12.5000 ug | ORAL_TABLET | Freq: Every day | ORAL | Status: DC
Start: 2017-11-13 — End: 2017-11-16
  Administered 2017-11-13 – 2017-11-16 (×4): 12.5 ug via ORAL
  Filled 2017-11-12 (×7): qty 1

## 2017-11-12 NOTE — [facility] Group Notes (Signed)
[place] Group Notes:  (Nursing/MHT/Case Management/Adjunct)  Date:  11/12/2017  Time:  2:03 PM  Type of Therapy:  Psychoeducational Skills  Participation Level:  Active  Participation Quality:  Appropriate  Affect:  Appropriate  Cognitive:  Appropriate  Insight:  Appropriate  Engagement in Group:  Engaged  Modes of Intervention:  Problem-solving  Summary of Progress/Problems: Pt attended Psychoeducational group with top topic healthy support systems.  [name] 11/12/2017, 2:03 PM

## 2017-11-12 NOTE — Progress Notes (Signed)
D Pt is observed UAL on the 400 hall today- she tolerates this fairly well. She remains profoundly depressed. HEr affect is sad, dark and her mood dperessed. HEr speech and movement is slow and deliberate. Her words are soft-spoken. She is able to identify for this writer ways she is feeling better, and also she can verbalize feelings of worry and anxiety related to potential ECT therapey.     A She completed her daily assessment and on this she wrote she has experienced SI today but she is able to contract with this Probation officer to NOT hurt herslef. She rates her depression, hopleessness and anxiety " 6/8/8/", respectively. She says she and her family are slated to have a treatment meeting tomorrow.     R Safety si in place and poc cont.

## 2017-11-12 NOTE — Progress Notes (Signed)
[facility] MD Progress Note  11/12/2017 3:35 PM Crystal Duarte  MRN:  [id number]   Subjective: patient reports persistent depression-acknowledges some improvement compared to admission, but overall describes an ongoing feeling of depression and hopelessness . Denies suicidal plan or intention, but states she is fearful she may attempt suicide after discharge. Tends to ruminate about negative aspects of her life- states " I used to have a home, a career, friends, and now I am going to have to live with my son". In spite of above, does endorse a slightly  improved mood compared to how she felt on admission, but describes mood improvement as modest thus far .  Objective: I have discussed case with treatment team and have met with patient. 57 year old female, long history of severe depression, recent suicidal attempt by overdose.  Had been planning suicide prior to admission by putting financial affairs in order. As above, patient reports ongoing severe depression, a subjective sense of hopelessness, low energy level and persistent sense of anhedonia, and ongoing intermittent passive SI. She denies suicidal ideations at present, and presents future oriented, such as being interested in a family meeting with her son, remaining interested in ECT as a potential treatment option, and expressing interest in an adjuvant medication to help manage her depression.  Visible on unit, no disruptive or agitated behaviors at this time. Denies medication side effects.  Reports history of mild peripheral, bipedal edema- at this time minimal edema noted, states it resolves after spending some time in bed and overnight.     Principal Problem: MDD Diagnosis:   Patient Active Problem List   Diagnosis Date Noted  . Severe episode of recurrent major depressive disorder, without psychotic features ([place]) [F33.2] 07/18/2017  . DOE (dyspnea on exertion) [R06.09] 12/26/2016  . COPD GOLD II  [J44.9] 12/26/2016  . Orthostatic  hypotension [I95.1] 12/14/2016  . Chest tightness [R07.89] 12/14/2016  . Weight loss [R63.4] 12/14/2016  . Routine general medical examination at a health care facility [Z00.00] 10/09/2015  . 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 Hx of adenomatous polyp of colon [Z86.010] 12/04/2014  . Memory loss [R41.3] 10/04/2014  . Affective bipolar disorder ([place]) [F31.9] 03/12/2014  . Peripheral vascular disease, unspecified ([place]) [I73.9] 10/10/2013  . Smoker [F17.200] 08/28/2012  . Menopause [Z78.0] 08/28/2012  . Multiple pulmonary nodules [R91.8] 11/12/2010   Total Time spent with patient: 20 minutes  Past Psychiatric History: See admission H&P  Past Medical History:  Past Medical History:  Diagnosis Date  . Coccidioidomycosis, pulmonary ([place])   . Depression   . Hx of adenomatous polyp of colon 12/04/2014  . Osteopenia 11/2016   T score -2.2 FRAX 17%/2.4%  . Panic attack     Past Surgical History:  Procedure Laterality Date  . ABDOMINAL HYSTERECTOMY    . COLONOSCOPY  2001   hemorrhoidectomy  . LUNG SURGERY  2001   VATS surgery   . OVARIAN CYST REMOVAL  1970's  . VESICOVAGINAL FISTULA CLOSURE W/ TAH  2005   Family History:  Family History  Problem Relation Age of Onset  . Asthma Mother   . Ovarian cancer Mother   . Cancer Mother        OVARIAN  . Varicose Veins Mother   . Heart disease Father   . Peripheral vascular disease Father   . Cancer Maternal Grandmother        LUNG- LUNG  . Colon cancer Neg Hx    Family Psychiatric  History: See admission H&P Social History:  Social History   Substance and Sexual Activity  Alcohol Use No  . Alcohol/week: 0.0 standard drinks     Social History   Substance and Sexual Activity  Drug Use No    Social History   Socioeconomic History  . Marital status: Divorced    Spouse name: Not on file  . Number of children: 2  . Years of education: Not on file  . Highest education level: Not on file  Occupational History  .  Occupation: Forensic psychologist: [employer]  . Financial resource strain: Not on file  . Food insecurity:    Worry: Not on file    Inability: Not on file  . Transportation needs:    Medical: Not on file    Non-medical: Not on file  Tobacco Use  . Smoking status: Current Some Day Smoker    Packs/day: 1.00    Years: 42.00    Pack years: 42.00    Types: Cigarettes    Last attempt to quit: 09/25/2016    Years since quitting: 1.1  . Smokeless tobacco: Former Systems developer    Quit date: 06/13/2016  Substance and Sexual Activity  . Alcohol use: No    Alcohol/week: 0.0 standard drinks  . Drug use: No  . Sexual activity: Not on file  Lifestyle  . Physical activity:    Days per week: Not on file    Minutes per session: Not on file  . Stress: Not on file  Relationships  . Social connections:    Talks on phone: Not on file    Gets together: Not on file    Attends religious service: Not on file    Active member of club or organization: Not on file    Attends meetings of clubs or organizations: Not on file    Relationship status: Not on file  Other Topics Concern  . Not on file  Social History Narrative  . Not on file   Additional Social History:   Sleep: Fair  Appetite:  improving  Current Medications: Current Facility-Administered Medications  Medication Dose Route Frequency Provider Last Rate Last Dose  . acetaminophen (TYLENOL) tablet 650 mg  650 mg Oral Q6H PRN Ethelene Hal, NP   650 mg at 11/11/17 2135  . acidophilus (RISAQUAD) capsule 1 capsule  1 capsule Oral Daily Sharma Covert, MD   1 capsule at 11/12/17 0941  . alum & mag hydroxide-simeth (MAALOX/MYLANTA) 200-200-20 MG/5ML suspension 30 mL  30 mL Oral Q4H PRN Ethelene Hal, NP      . busPIRone (BUSPAR) tablet 15 mg  15 mg Oral TID Sharma Covert, MD   15 mg at 11/12/17 1144  . diclofenac sodium (VOLTAREN) 1 % transdermal gel 2 g  2 g Topical QID Sharma Covert, MD   2 g  at 11/10/17 1711  . gabapentin (NEURONTIN) capsule 400 mg  400 mg Oral TID Minda Ditto, RPH   400 mg at 11/12/17 1144  . hydrOXYzine (ATARAX/VISTARIL) tablet 25 mg  25 mg Oral TID PRN Ethelene Hal, NP   25 mg at 11/12/17 0943  . magnesium hydroxide (MILK OF MAGNESIA) suspension 30 mL  30 mL Oral Daily PRN Ethelene Hal, NP      . pneumococcal 23 valent vaccine (PNU-IMMUNE) injection 0.5 mL  0.5 mL Intramuscular Tomorrow-1000 Sharma Covert, MD      . propranolol (INDERAL) tablet 20 mg  20 mg Oral TID Mallie Darting,  Cordie Grice, MD   20 mg at 11/12/17 1144  . traZODone (DESYREL) tablet 100 mg  100 mg Oral QHS PRN Cobos, Myer Peer, MD   100 mg at 11/11/17 2135  . vortioxetine HBr (TRINTELLIX) tablet 15 mg  15 mg Oral Daily Cobos, Myer Peer, MD   15 mg at 11/12/17 0941    Lab Results:  No results found for this or any previous visit (from the past 48 hour(s)).  Blood Alcohol level:  Lab Results  Component Value Date   ETH <10 11/02/2017   ETH <10 16/01/9603    Metabolic Disorder Labs: Lab Results  Component Value Date   HGBA1C 5.4 07/21/2017   MPG 108.28 07/21/2017   MPG 114 08/28/2012   No results found for: PROLACTIN Lab Results  Component Value Date   CHOL 200 07/21/2017   TRIG 208 (H) 07/21/2017   HDL 54 07/21/2017   CHOLHDL 3.7 07/21/2017   VLDL 42 (H) 07/21/2017   LDLCALC 104 (H) 07/21/2017   LDLCALC 119 (H) 10/08/2015   Musculoskeletal: Strength & Muscle Tone: within normal limits Gait & Station: normal Patient leans: N/A  Psychiatric Specialty Exam: Physical Exam  Nursing note and vitals reviewed. Constitutional: She is oriented to person, place, and time. She appears well-developed and well-nourished.  HENT:  Head: Normocephalic and atraumatic.  Respiratory: Effort normal.  Neurological: She is alert and oriented to person, place, and time.    ROS  No nausea, no vomiting, mild distal ( lower extremity) bilateral edema  Blood pressure  110/73, pulse 77, temperature 98.7 F (37.1 C), temperature source Oral, resp. rate 16, height '5\' 4"'$  (1.626 m), weight 76.7 kg, SpO2 97 %.Body mass index is 29.01 kg/m.  General Appearance: Improving grooming  Eye Contact:  Good  Speech:  Normal Rate  Volume:  Normal  Mood:  remains significantly depressed, some improvement compared to admission  Affect:  constricted, vaguely anxious   Thought Process:  Linear and Descriptions of Associations: Intact  Orientation:  Full (Time, Place, and Person)  Thought Content:  No hallucinations, no delusions expressed  Suicidal Thoughts:  Yes.  without intent/plan-denies suicidal plan or intention, contracts for safety on unit, but expresses concern that she may become suicidal after discharge  Homicidal Thoughts:  No  Memory:  Recent and remote grossly intact  Judgement:  Other:  improving   Insight:  Present  Psychomotor Activity:  Normal  Concentration:  Concentration: Good and Attention Span: Good  Recall:  Good  Fund of Knowledge:  Good  Language:  Good  Akathisia:  Negative  Handed:  Right  AIMS (if indicated):     Assets:  Desire for Improvement Financial Resources/Insurance Housing Resilience Social Support Talents/Skills  ADL's:  Intact  Cognition:  WNL  Sleep:  Number of Hours: 3    Assessment -57 year old female with a history of chronic , severe major depressive disorder , status post suicidal attempt by overdose.  Improvement has been limited, partial thus far, but there has been some improvement since admission, as noted by improved grooming, improved eye contact, somewhat more reactive affect . Overall , reports ongoing subjective sense of hopelessness, and passive SI.  She is tolerating Trintellix well thus far. She is interested in adjuvant treatment- states Abilify was not helpful in the past, and that Lithium was poorly tolerated . She does express interest in low dose Cytomel as an adjuvant option ( TSH WNL). Side effects  discussed . She is interested in family meeting with  her son, daughter, on phone if needed, in order to further discuss disposition options, which at this time continues to be recommendation for ECT treatment, which she has responded to in the past .    Treatment Plan Summary: Treatment plan reviewed as below today August 11 Treatment team working on disposition planning-plan has been to work on transferring to  [facility] for inpatient ECT once bed is available. Continue Trintellix 15  mg daily for depression  Continue  Trazodone 100 mg QHS PRN  for insomnia  Start Cytomel 12.5 micrograms QDAY as augmentation/adjuvant antidepressant strategy Continue Propranolol 20 mg 3 times daily for anxiety Continue BuSpar 15 mg 3 times daily for anxiety Continue Neurontin 400 mg 3 times daily for anxiety/pain Continue Vistaril 25 mg every 8 hours PRN for anxiety as needed Family meeting with patient and adult children ( may be via phone conference call) tomorrow afternoon   Jenne Campus, MD 11/12/2017, 3:35 PM   Patient ID: Crystal Duarte, female   DOB: 1961/02/04, 57 y.o.   MRN: [id number]

## 2017-11-12 NOTE — Plan of Care (Signed)
  Problem: Education: Goal: Emotional status will improve Outcome: Progressing   

## 2017-11-12 NOTE — [facility] Group Notes (Signed)
[facility] LCSW Group Therapy Note  Date/Time:  11/12/2017 10:00-11:00AM  Type of Therapy and Topic:  Group Therapy:  Healthy and Unhealthy Supports  Participation Level:  Active   Description of Group:  Patients in this group were introduced to the idea of adding a variety of healthy supports to address the various needs in their lives.Patients discussed what additional healthy supports could be helpful in their recovery and wellness after discharge in order to prevent future hospitalizations.   An emphasis was placed on using counselor, doctor, therapy groups, 12-step groups, and problem-specific support groups to expand supports.    Therapeutic Goals:   1)  discuss importance of adding supports to stay well once out of the hospital  2)  compare healthy versus unhealthy supports and identify some examples of each  3)  generate ideas and descriptions of healthy supports that can be added  4)  offer mutual support about how to help supports to understand the mental health issues faced   5)  encourage active participation in and adherence to discharge plan    Summary of Patient Progress:  The patient stated that current healthy supports in her life are her 2 grown children, but she hates to "burden" them while current unhealthy supports include herself because she won't reach for help.  The patient expressed a willingness to add additional support(s) to help in her recovery journey.   Therapeutic Modalities:   Motivational Interviewing Brief Solution-Focused Therapy  Selmer Dominion, LCSW

## 2017-11-13 NOTE — Progress Notes (Addendum)
D: Patient continues to experience depressive symptoms.  Her affect is flat, blunted; her mood is sad. Patient indicates that she has passive thoughts of self harm, however, she is able to contract for safety on the unit. She continues to feel hopeless over her living situation.  She rates her depression as a 6; hopelessness and anxiety as an 8.  She is sleeping and eating well; her energy level is normal and her concentration is good.  Her goal today is to "meet with the doctor and my family to determine next steps."  Patient continues to wait for a bed at [facility] for ECT treatment. Patient complains about some pain in left wrist that she states is a prior fracture.    A: Continue to monitor medication management and MD orders.  Safety checks continued every 15 minutes per protocol.  Offer support and encouragement as needed.  R: Patient is receptive to staff; her behavior is appropriate.

## 2017-11-13 NOTE — Progress Notes (Addendum)
[facility] MD Progress Note  11/13/2017 2:40 PM [address]  MRN:  [id number]   Subjective: patient endorses improvement compared to admission, acknowledges mood has improved, but continues to report feeling depressed, states " I don't feel safe to discharge , I feel I could do something stupid , I am still in that bad depression state". She remains interested in ECT , which has been an effective/helpful and well tolerated treatment modality in the past . Denies medication side effects.   Objective: I have discussed case with treatment team and have met with patient. 57 year old female, long history of severe depression, recent suicidal attempt by overdose.  Had been planning suicide prior to admission by putting financial affairs in order. Patient presents with partially improved mood and a fuller range of affect- improvement is noted by improved grooming , a less blunted, more reactive affect, and more milieu participation. She states she feels better, but reports an ongoing sense of depression and " being in a dark place". States she has no current suicidal ideations but does not feel safe considering discharge at this time because " I think I would not do well, could do something stupid", which she acknowledges refers to suicidal attempt. Today we had family meeting - via conference call- with her adult son Elta Guadeloupe . Elta Guadeloupe corroborates patient is improving but still depressed . We discussed treatment options and disposition planning . Based on history of good response to ECT in the past, we agreed that plan is to continue working towards inpatient ECT . Elta Guadeloupe also reiterated importance of patient following up with maintenance ECT once she completes inpatient ECT course , as she has done well/improved significantly with ECT in the past, but improvement has been temporary once she completes inpatient course .  He wants her to go live with him after discharge and he Regis Bill will help provide support and  transportation for outpatient ECT . * patient reports residual pain on left wrist following a radius fracture earlier this year- reports cast was removed in July. Voltaren cream has been helpful and well tolerated .  Denies medication side effects.     Principal Problem: MDD Diagnosis:   Patient Active Problem List   Diagnosis Date Noted  . Severe episode of recurrent major depressive disorder, without psychotic features ([place]) [F33.2] 07/18/2017  . DOE (dyspnea on exertion) [R06.09] 12/26/2016  . COPD GOLD II  [J44.9] 12/26/2016  . Orthostatic hypotension [I95.1] 12/14/2016  . Chest tightness [R07.89] 12/14/2016  . Weight loss [R63.4] 12/14/2016  . Routine general medical examination at a health care facility [Z00.00] 10/09/2015  . 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 Hx of adenomatous polyp of colon [Z86.010] 12/04/2014  . Memory loss [R41.3] 10/04/2014  . Affective bipolar disorder ([place]) [F31.9] 03/12/2014  . Peripheral vascular disease, unspecified ([place]) [I73.9] 10/10/2013  . Smoker [F17.200] 08/28/2012  . Menopause [Z78.0] 08/28/2012  . Multiple pulmonary nodules [R91.8] 11/12/2010   Total Time spent with patient: 20 minutes  Past Psychiatric History: See admission H&P  Past Medical History:  Past Medical History:  Diagnosis Date  . Coccidioidomycosis, pulmonary ([place])   . Depression   . Hx of adenomatous polyp of colon 12/04/2014  . Osteopenia 11/2016   T score -2.2 FRAX 17%/2.4%  . Panic attack     Past Surgical History:  Procedure Laterality Date  . ABDOMINAL HYSTERECTOMY    . COLONOSCOPY  2001   hemorrhoidectomy  . LUNG SURGERY  2001   VATS surgery   .  OVARIAN CYST REMOVAL  1970's  . VESICOVAGINAL FISTULA CLOSURE W/ TAH  2005   Family History:  Family History  Problem Relation Age of Onset  . Asthma Mother   . Ovarian cancer Mother   . Cancer Mother        OVARIAN  . Varicose Veins Mother   . Heart disease Father   . Peripheral vascular  disease Father   . Cancer Maternal Grandmother        LUNG- LUNG  . Colon cancer Neg Hx    Family Psychiatric  History: See admission H&P Social History:  Social History   Substance and Sexual Activity  Alcohol Use No  . Alcohol/week: 0.0 standard drinks     Social History   Substance and Sexual Activity  Drug Use No    Social History   Socioeconomic History  . Marital status: Divorced    Spouse name: Not on file  . Number of children: 2  . Years of education: Not on file  . Highest education level: Not on file  Occupational History  . Occupation: Forensic psychologist: [employer]  . Financial resource strain: Not on file  . Food insecurity:    Worry: Not on file    Inability: Not on file  . Transportation needs:    Medical: Not on file    Non-medical: Not on file  Tobacco Use  . Smoking status: Current Some Day Smoker    Packs/day: 1.00    Years: 42.00    Pack years: 42.00    Types: Cigarettes    Last attempt to quit: 09/25/2016    Years since quitting: 1.1  . Smokeless tobacco: Former Systems developer    Quit date: 06/13/2016  Substance and Sexual Activity  . Alcohol use: No    Alcohol/week: 0.0 standard drinks  . Drug use: No  . Sexual activity: Not on file  Lifestyle  . Physical activity:    Days per week: Not on file    Minutes per session: Not on file  . Stress: Not on file  Relationships  . Social connections:    Talks on phone: Not on file    Gets together: Not on file    Attends religious service: Not on file    Active member of club or organization: Not on file    Attends meetings of clubs or organizations: Not on file    Relationship status: Not on file  Other Topics Concern  . Not on file  Social History Narrative  . Not on file   Additional Social History:   Sleep: improving   Appetite:  improving  Current Medications: Current Facility-Administered Medications  Medication Dose Route Frequency Provider Last Rate Last  Dose  . acetaminophen (TYLENOL) tablet 650 mg  650 mg Oral Q6H PRN Ethelene Hal, NP   650 mg at 11/11/17 2135  . acidophilus (RISAQUAD) capsule 1 capsule  1 capsule Oral Daily Sharma Covert, MD   1 capsule at 11/13/17 0828  . alum & mag hydroxide-simeth (MAALOX/MYLANTA) 200-200-20 MG/5ML suspension 30 mL  30 mL Oral Q4H PRN Ethelene Hal, NP      . busPIRone (BUSPAR) tablet 15 mg  15 mg Oral TID Sharma Covert, MD   15 mg at 11/13/17 1153  . diclofenac sodium (VOLTAREN) 1 % transdermal gel 2 g  2 g Topical QID Sharma Covert, MD   2 g at 11/10/17 1711  . gabapentin (NEURONTIN)  capsule 400 mg  400 mg Oral TID Minda Ditto, RPH   400 mg at 11/13/17 1153  . hydrOXYzine (ATARAX/VISTARIL) tablet 25 mg  25 mg Oral TID PRN Ethelene Hal, NP   25 mg at 11/12/17 2128  . liothyronine (CYTOMEL) tablet 12.5 mcg  12.5 mcg Oral Daily Cobos, Myer Peer, MD   12.5 mcg at 11/13/17 0829  . magnesium hydroxide (MILK OF MAGNESIA) suspension 30 mL  30 mL Oral Daily PRN Ethelene Hal, NP      . pneumococcal 23 valent vaccine (PNU-IMMUNE) injection 0.5 mL  0.5 mL Intramuscular Tomorrow-1000 Sharma Covert, MD      . propranolol (INDERAL) tablet 20 mg  20 mg Oral TID Sharma Covert, MD   20 mg at 11/13/17 1153  . traZODone (DESYREL) tablet 200 mg  200 mg Oral QHS PRN Laverle Hobby, PA-C      . vortioxetine HBr (TRINTELLIX) tablet 15 mg  15 mg Oral Daily Cobos, Myer Peer, MD   15 mg at 11/13/17 0315    Lab Results:  No results found for this or any previous visit (from the past 20 hour(s)).  Blood Alcohol level:  Lab Results  Component Value Date   ETH <10 11/02/2017   ETH <10 94/58/5929    Metabolic Disorder Labs: Lab Results  Component Value Date   HGBA1C 5.4 07/21/2017   MPG 108.28 07/21/2017   MPG 114 08/28/2012   No results found for: PROLACTIN Lab Results  Component Value Date   CHOL 200 07/21/2017   TRIG 208 (H) 07/21/2017   HDL 54  07/21/2017   CHOLHDL 3.7 07/21/2017   VLDL 42 (H) 07/21/2017   LDLCALC 104 (H) 07/21/2017   LDLCALC 119 (H) 10/08/2015   Musculoskeletal: Strength & Muscle Tone: within normal limits Gait & Station: normal Patient leans: N/A  Psychiatric Specialty Exam: Physical Exam  Nursing note and vitals reviewed. Constitutional: She is oriented to person, place, and time. She appears well-developed and well-nourished.  HENT:  Head: Normocephalic and atraumatic.  Respiratory: Effort normal.  Neurological: She is alert and oriented to person, place, and time.    ROS  No nausea, no vomiting, mild distal ( lower extremity) bilateral edema, Left wrist pain ( chronic following wrist fracture earlier this year)   Blood pressure 116/68, pulse 78, temperature 98.7 F (37.1 C), temperature source Oral, resp. rate 16, height 5' 4" (1.626 m), weight 76.7 kg, SpO2 97 %.Body mass index is 29.01 kg/m.  General Appearance: Improving grooming  Eye Contact:  Good  Speech:  Normal Rate  Volume:  Normal  Mood:  Partial improvement compared to admission, remains depressed   Affect:  constricted, vaguely anxious - affect becoming more reactive  Thought Process:  Linear and Descriptions of Associations: Intact  Orientation:  Full (Time, Place, and Person)  Thought Content:  No hallucinations, no delusions expressed  Suicidal Thoughts:  Yes.  without intent/plan-denies suicidal plan or intention, contracts for safety on unit, but expresses concern that she may become suicidal after discharge  Homicidal Thoughts:  No  Memory:  Recent and remote grossly intact  Judgement:  Other:  improving   Insight:  Present  Psychomotor Activity:  Normal  Concentration:  Concentration: Good and Attention Span: Good  Recall:  Good  Fund of Knowledge:  Good  Language:  Good  Akathisia:  Negative  Handed:  Right  AIMS (if indicated):     Assets:  Desire for Improvement Financial  Resources/Insurance  Housing Resilience Social Support Talents/Skills  ADL's:  Intact  Cognition:  WNL  Sleep:  Number of Hours: 6.75    Assessment -57 year old female with a history of chronic , severe major depressive disorder , status post suicidal attempt by overdose.   Patient has improved partially since admission, but remains depressed, and continues to report subjective hopelessness,  passive SI . She has history of good response to ECT in the past, and remains interested in this treatment modality. Adult son and daughter involved in her care and supportive of inpatient ECT followed by maintenance ECT . Denies medication side effects. Has tolerated Trintellix and low dose Cytomel as augmentation strategy well thus far .   Treatment Plan Summary: Treatment plan reviewed as below today August 12 Treatment team working on disposition planning-working on referral to Dr. Weber Cooks at [facility] / have also referred to  [facility] (  for inpatient ECT)  Continue Trintellix 15  mg daily for depression  Continue Trazodone 100 mg QHS PRN  for insomnia  Continue Cytomel 12.5 micrograms QDAY as augmentation/adjuvant antidepressant strategy Continue Propranolol 20 mg 3 times daily for anxiety Continue BuSpar 15 mg 3 times daily for anxiety Continue Neurontin 400 mg 3 times daily for anxiety/pain Continue Vistaril 25 mg every 8 hours PRN for anxiety as needed   Jenne Campus, MD 11/13/2017, 2:40 PM   Patient ID: Crystal Duarte, female   DOB: 10-Jun-1960, 57 y.o.   MRN: [id number]

## 2017-11-13 NOTE — Progress Notes (Signed)
Patient ID: Crystal Duarte, female   DOB: 1960-10-25, 57 y.o.   MRN: [id number] D: Patient in dayroom interacting well with peers. Pt mood and affect appears depressed and flat. Pt reports she had a family session today which went very well.  Pt attended evening wrap up group and engage in discussions. Denies  SI/HI/AVH and pain.  A: Support and encouragement offered as needed to express needs. Medications administered as prescribed.  R: Patient is safe and cooperative on unit. Will continue to monitor  for safety and stability.

## 2017-11-13 NOTE — [facility] Group Notes (Signed)
[place] Group Notes:   Nursing Group  Date:  11/13/2017  Time:  4:30 p.m.  Type of Therapy:  Psychoeducational Skills  Participation Level:  Active  Participation Quality:  Appropriate and Attentive  Affect:  Appropriate  Cognitive:  Alert and Appropriate  Insight:  Appropriate  Engagement in Group:  Engaged  Modes of Intervention:  Support  Summary of Progress/Problems: Patient was engaged.  States that she like to play with her 7 grandchildren in her spare time.  Discussed mental illness vs mental wellness.  Zipporah Plants 11/13/2017, 6:20 PM

## 2017-11-13 NOTE — Progress Notes (Signed)
Patient ID: Crystal Duarte, female   DOB: January 20, 1961, 57 y.o.   MRN: [id number] D: Patient in dayroom interacting well with peers. Pt mood and affect appears depressed and flat. Pt got upset about her medication decreased dosage. Pt refused to take the medication at one point till it is increased. Encouraged pt to take medication and if not sleepy will call for another. Pt attended evening wrap up group and engage in discussion. Denies  SI/HI/AVH and pain.  A: Support and encouragement offered as needed to express needs. Medications administered as prescribed.  R: Patient is safe and cooperative on unit. Will continue to monitor  for safety and stability.

## 2017-11-13 NOTE — [facility] Group Notes (Signed)
[place] LCSW Group Therapy Note  Date/Time: 11/13/17, 1315  Type of Therapy and Topic:  Group Therapy:  Overcoming Obstacles  Participation Level:  moderate  Description of Group:    In this group patients will be encouraged to explore what they see as obstacles to their own wellness and recovery. They will be guided to discuss their thoughts, feelings, and behaviors related to these obstacles. The group will process together ways to cope with barriers, with attention given to specific choices patients can make. Each patient will be challenged to identify changes they are motivated to make in order to overcome their obstacles. This group will be process-oriented, with patients participating in exploration of their own experiences as well as giving and receiving support and challenge from other group members.  Therapeutic Goals: 1. Patient will identify personal and current obstacles as they relate to admission. 2. Patient will identify barriers that currently interfere with their wellness or overcoming obstacles.  3. Patient will identify feelings, thought process and behaviors related to these barriers. 4. Patient will identify two changes they are willing to make to overcome these obstacles:    Summary of Patient Progress: Pt shared that depression and finances are obstacles in her life currently.  Pt made several comments during group discussion regarding ways to overcome obstacles and was attentive throughout.       Therapeutic Modalities:   Cognitive Behavioral Therapy Solution Focused Therapy Motivational Interviewing Relapse Prevention Therapy  Lurline Idol, LCSW

## 2017-11-13 NOTE — Plan of Care (Signed)
  Problem: Education: Goal: Knowledge of [place] [facility] Education information/materials will improve Outcome: Progressing Goal: Emotional status will improve Outcome: Progressing Goal: Mental status will improve Outcome: Progressing   Problem: Activity: Goal: Interest or engagement in activities will improve Outcome: Progressing Goal: Sleeping patterns will improve Outcome: Progressing   Problem: Coping: Goal: Ability to demonstrate self-control will improve Outcome: Progressing   Problem: Health Behavior/Discharge Planning: Goal: Compliance with treatment plan for underlying cause of condition will improve Outcome: Progressing   Problem: Safety: Goal: Periods of time without injury will increase Outcome: Progressing   Problem: Activity: Goal: Interest or engagement in leisure activities will improve Outcome: Progressing   Problem: Coping: Goal: Coping ability will improve Outcome: Progressing   Problem: Health Behavior/Discharge Planning: Goal: Ability to make decisions will improve Outcome: Progressing

## 2017-11-14 MED ORDER — NABUMETONE 500 MG PO TABS
500.0000 mg | ORAL_TABLET | Freq: Two times a day (BID) | ORAL | Status: DC
  Administered 2017-11-14 – 2017-11-16 (×5): 500 mg via ORAL
  Filled 2017-11-14 (×8): qty 1

## 2017-11-14 MED ORDER — FLUCONAZOLE 150 MG PO TABS
150.0000 mg | ORAL_TABLET | Freq: Once | ORAL | Status: AC
  Administered 2017-11-14: 150 mg via ORAL
  Filled 2017-11-14 (×2): qty 1

## 2017-11-14 MED ORDER — CAPSAICIN 0.025 % EX CREA
TOPICAL_CREAM | Freq: Two times a day (BID) | CUTANEOUS | Status: DC
  Administered 2017-11-14 – 2017-11-16 (×3): via TOPICAL
  Filled 2017-11-14 (×3): qty 60

## 2017-11-14 MED ORDER — GABAPENTIN 300 MG PO CAPS
600.0000 mg | ORAL_CAPSULE | Freq: Three times a day (TID) | ORAL | Status: DC
  Administered 2017-11-14 – 2017-11-15 (×3): 600 mg via ORAL
  Filled 2017-11-14 (×8): qty 2

## 2017-11-14 NOTE — [facility] Group Notes (Signed)
[place] LCSW Group Therapy Note  Date/Time: 11/14/17, 1315  Type of Therapy/Topic:  Group Therapy:  Feelings about Diagnosis  Participation Level:  Active   Mood: pleasant   Description of Group:    This group will allow patients to explore their thoughts and feelings about diagnoses they have received. Patients will be guided to explore their level of understanding and acceptance of these diagnoses. Facilitator will encourage patients to process their thoughts and feelings about the reactions of others to their diagnosis, and will guide patients in identifying ways to discuss their diagnosis with significant others in their lives. This group will be process-oriented, with patients participating in exploration of their own experiences as well as giving and receiving support and challenge from other group members.   Therapeutic Goals: 1. Patient will demonstrate understanding of diagnosis as evidence by identifying two or more symptoms of the disorder:  2. Patient will be able to express two feelings regarding the diagnosis 3. Patient will demonstrate ability to communicate their needs through discussion and/or role plays  Summary of Patient Progress:Pt was active during group discussion regarding symptoms of bipolar disorder, mental health stigma, and feelings about diagnosis. Pt appears to be doing much better, very engaged and more upbeat.            Therapeutic Modalities:   Cognitive Behavioral Therapy Brief Therapy Feelings Identification   Lurline Idol, LCSW

## 2017-11-14 NOTE — Progress Notes (Signed)
CSW spoke with Crystal Duarte in [facility].  Dr Weber Cooks now will not be returning until tomorrow, Wed, 8/14.  He asked that CSW call back about transfer then.  CSW also made two attempts to contact [facility] to inquire about open inpt beds.  No answer from [facility] and voicemail is not set up so no message could be left. Winferd Humphrey, MSW, LCSW Clinical Social Worker 11/14/2017 12:40 PM

## 2017-11-14 NOTE — Progress Notes (Signed)
Pt presents with a flat affect and a depressed mood. Pt appeared disheveled on approach. Pt noted to be minimal and guarded on approach. Pt forwarded little information during shift assessment. Pt expressed feeling tired this morning because she did not sleep well last night. Pt appears withdrawn this morning and is isolative to her room. Pt denies SI/HI.  Pt compliant with taking meds and denies any side effects.  Medications reviewed with pt. Verbal support provided. Pt encouraged to attend groups. 15 minute checks performed for safety.  No concerns verbalized by pt. Pt expressed waiting to get accepted for ECT treatments for depression.

## 2017-11-14 NOTE — Progress Notes (Signed)
[facility] MD Progress Note  11/14/2017 12:46 PM Monigue Spraggins  MRN:  [id number] Subjective: Patient is seen and examined.  Patient is a 57 year old female with a long-standing history of major depression, severe.  She is seen in follow-up.  When I last saw her on 8/5 the plan was to get an ECT consult, and transfer to an outside facility that had ECT if possible.  Unfortunately Dr. Weber Cooks was out of town on a vacation the entire time, and the staff was unable to get her transferred to [facility] for ECT.  We are waiting for Dr. Weber Cooks to return to the hospital on 8/14.  Hopefully he will be able to see her rapidly.  She remains depressed.  During the course that I have been out her Trintellix dosage was increased to 15 mg p.o. daily.  She stated her mood has improved to some degree.  She is able to get out of bed and go to groups now.  She still endorses helplessness, hopelessness and worthlessness.  There was a family meeting that took place during the course of the hospitalization, and everyone is in agreement with pursuing ECT.  She has several other complaints today.  She continues to have left wrist swelling.  The left wrist was fractured in May, and she was in a cast for 6 weeks.  Physical therapy as an outpatient asked her to keep the brace off as much as she could tolerate to keep the wrist from stiffening.  She also complains of some burning, tingling and itching in her feet bilaterally.  Physical examination showed no rashes or any other kind of skin breakdown.  She does have a significant history of orthopedic problems in her neck and spine.  These sound like peripheral neuropathy pains, and we discussed possible treatment options.     Objective: I have discussed case with treatment team and have met with patient. 57 year old female, long history of severe depression, recent suicidal attempt by overdose.  Had been planning suicide prior to admission by putting financial affairs in order. Patient  presents with partially improved mood and a fuller range of affect- improvement is noted by improved grooming , a less blunted, more reactive affect, and more milieu participation. She states she feels better, but reports an ongoing sense of depression and " being in a dark place". States she has no current suicidal ideations but does not feel safe considering discharge at this time because " I think I would not do well, could do something stupid", which she acknowledges refers to suicidal attempt. Today we had family meeting - via conference call- with her adult son Elta Guadeloupe . Elta Guadeloupe corroborates patient is improving but still depressed . We discussed treatment options and disposition planning . Based on history of good response to ECT in the past, we agreed that plan is to continue working towards inpatient ECT . Elta Guadeloupe also reiterated importance of patient following up with maintenance ECT once she completes inpatient ECT course , as she has done well/improved significantly with ECT in the past, but improvement has been temporary once she completes inpatient course .  He wants her to go live with him after discharge and he Regis Bill will help provide support and transportation for outpatient ECT . * patient reports residual pain on left wrist following a radius fracture earlier this year- reports cast was removed in July. Voltaren cream has been helpful and well tolerated .  Denies medication side effects.  Principal Problem: <principal problem not specified> Diagnosis:  Patient Active Problem List   Diagnosis Date Noted  . Severe episode of recurrent major depressive disorder, without psychotic features ([place]) [F33.2] 07/18/2017  . DOE (dyspnea on exertion) [R06.09] 12/26/2016  . COPD GOLD II  [J44.9] 12/26/2016  . Orthostatic hypotension [I95.1] 12/14/2016  . Chest tightness [R07.89] 12/14/2016  . Weight loss [R63.4] 12/14/2016  . Routine general medical examination at a health care facility [Z00.00]  10/09/2015  . 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 Hx of adenomatous polyp of colon [Z86.010] 12/04/2014  . Memory loss [R41.3] 10/04/2014  . Affective bipolar disorder ([place]) [F31.9] 03/12/2014  . Peripheral vascular disease, unspecified ([place]) [I73.9] 10/10/2013  . Smoker [F17.200] 08/28/2012  . Menopause [Z78.0] 08/28/2012  . Multiple pulmonary nodules [R91.8] 11/12/2010   Total Time spent with patient: 20 minutes  Past Psychiatric History: See admission H&P  Past Medical History:  Past Medical History:  Diagnosis Date  . Coccidioidomycosis, pulmonary ([place])   . Depression   . Hx of adenomatous polyp of colon 12/04/2014  . Osteopenia 11/2016   T score -2.2 FRAX 17%/2.4%  . Panic attack     Past Surgical History:  Procedure Laterality Date  . ABDOMINAL HYSTERECTOMY    . COLONOSCOPY  2001   hemorrhoidectomy  . LUNG SURGERY  2001   VATS surgery   . OVARIAN CYST REMOVAL  1970's  . VESICOVAGINAL FISTULA CLOSURE W/ TAH  2005   Family History:  Family History  Problem Relation Age of Onset  . Asthma Mother   . Ovarian cancer Mother   . Cancer Mother        OVARIAN  . Varicose Veins Mother   . Heart disease Father   . Peripheral vascular disease Father   . Cancer Maternal Grandmother        LUNG- LUNG  . Colon cancer Neg Hx    Family Psychiatric  History: See admission H&P Social History:  Social History   Substance and Sexual Activity  Alcohol Use No  . Alcohol/week: 0.0 standard drinks     Social History   Substance and Sexual Activity  Drug Use No    Social History   Socioeconomic History  . Marital status: Divorced    Spouse name: Not on file  . Number of children: 2  . Years of education: Not on file  . Highest education level: Not on file  Occupational History  . Occupation: Forensic psychologist: [employer]  . Financial resource strain: Not on file  . Food insecurity:    Worry: Not on file    Inability: Not on  file  . Transportation needs:    Medical: Not on file    Non-medical: Not on file  Tobacco Use  . Smoking status: Current Some Day Smoker    Packs/day: 1.00    Years: 42.00    Pack years: 42.00    Types: Cigarettes    Last attempt to quit: 09/25/2016    Years since quitting: 1.1  . Smokeless tobacco: Former Systems developer    Quit date: 06/13/2016  Substance and Sexual Activity  . Alcohol use: No    Alcohol/week: 0.0 standard drinks  . Drug use: No  . Sexual activity: Not on file  Lifestyle  . Physical activity:    Days per week: Not on file    Minutes per session: Not on file  . Stress: Not on file  Relationships  . Social connections:    Talks on phone:  Not on file    Gets together: Not on file    Attends religious service: Not on file    Active member of club or organization: Not on file    Attends meetings of clubs or organizations: Not on file    Relationship status: Not on file  Other Topics Concern  . Not on file  Social History Narrative  . Not on file   Additional Social History:                         Sleep: Good  Appetite:  Fair  Current Medications: Current Facility-Administered Medications  Medication Dose Route Frequency Provider Last Rate Last Dose  . acetaminophen (TYLENOL) tablet 650 mg  650 mg Oral Q6H PRN Ethelene Hal, NP   650 mg at 11/11/17 2135  . acidophilus (RISAQUAD) capsule 1 capsule  1 capsule Oral Daily Sharma Covert, MD   1 capsule at 11/14/17 0840  . alum & mag hydroxide-simeth (MAALOX/MYLANTA) 200-200-20 MG/5ML suspension 30 mL  30 mL Oral Q4H PRN Ethelene Hal, NP      . busPIRone (BUSPAR) tablet 15 mg  15 mg Oral TID Sharma Covert, MD   15 mg at 11/14/17 1206  . diclofenac sodium (VOLTAREN) 1 % transdermal gel 2 g  2 g Topical QID Sharma Covert, MD   2 g at 11/10/17 1711  . gabapentin (NEURONTIN) capsule 400 mg  400 mg Oral TID Minda Ditto, RPH   400 mg at 11/14/17 1206  . hydrOXYzine  (ATARAX/VISTARIL) tablet 25 mg  25 mg Oral TID PRN Ethelene Hal, NP   25 mg at 11/13/17 2113  . liothyronine (CYTOMEL) tablet 12.5 mcg  12.5 mcg Oral Daily Cobos, Myer Peer, MD   12.5 mcg at 11/14/17 0841  . magnesium hydroxide (MILK OF MAGNESIA) suspension 30 mL  30 mL Oral Daily PRN Ethelene Hal, NP      . pneumococcal 23 valent vaccine (PNU-IMMUNE) injection 0.5 mL  0.5 mL Intramuscular Tomorrow-1000 Sharma Covert, MD      . propranolol (INDERAL) tablet 20 mg  20 mg Oral TID Sharma Covert, MD   Stopped at 11/14/17 1206  . traZODone (DESYREL) tablet 200 mg  200 mg Oral QHS PRN Laverle Hobby, PA-C   200 mg at 11/13/17 2113  . vortioxetine HBr (TRINTELLIX) tablet 15 mg  15 mg Oral Daily Cobos, Myer Peer, MD   15 mg at 11/14/17 0840    Lab Results: No results found for this or any previous visit (from the past 72 hour(s)).  Blood Alcohol level:  Lab Results  Component Value Date   ETH <10 11/02/2017   ETH <10 40/11/6759    Metabolic Disorder Labs: Lab Results  Component Value Date   HGBA1C 5.4 07/21/2017   MPG 108.28 07/21/2017   MPG 114 08/28/2012   No results found for: PROLACTIN Lab Results  Component Value Date   CHOL 200 07/21/2017   TRIG 208 (H) 07/21/2017   HDL 54 07/21/2017   CHOLHDL 3.7 07/21/2017   VLDL 42 (H) 07/21/2017   LDLCALC 104 (H) 07/21/2017   LDLCALC 119 (H) 10/08/2015    Physical Findings: AIMS:  , ,  ,  ,    CIWA:    COWS:     Musculoskeletal: Strength & Muscle Tone: within normal limits Gait & Station: normal Patient leans: N/A  Psychiatric Specialty Exam: Physical Exam  Nursing note  and vitals reviewed. Constitutional: She is oriented to person, place, and time. She appears well-developed and well-nourished.  HENT:  Head: Normocephalic and atraumatic.  Respiratory: Effort normal.  Neurological: She is alert and oriented to person, place, and time.    ROS  Blood pressure 101/77, pulse 98, temperature 98.7  F (37.1 C), temperature source Oral, resp. rate 16, height '5\' 4"'$  (1.626 m), weight 76.7 kg, SpO2 97 %.Body mass index is 29.01 kg/m.  General Appearance: Casual  Eye Contact:  Fair  Speech:  Slow  Volume:  Decreased  Mood:  Depressed  Affect:  Congruent  Thought Process:  Coherent  Orientation:  Full (Time, Place, and Person)  Thought Content:  Logical  Suicidal Thoughts:  Yes.  without intent/plan  Homicidal Thoughts:  No  Memory:  Immediate;   Fair Recent;   Fair Remote;   Fair  Judgement:  Intact  Insight:  Fair  Psychomotor Activity:  Decreased  Concentration:  Concentration: Fair and Attention Span: Fair  Recall:  [employer] of Knowledge:  Fair  Language:  Fair  Akathisia:  Negative  Handed:  Right  AIMS (if indicated):     Assets:  Communication Skills Desire for Improvement Financial Resources/Insurance Housing Physical Health Resilience Social Support Talents/Skills  ADL's:  Intact  Cognition:  WNL  Sleep:  Number of Hours: 6.5     Treatment Plan Summary: Daily contact with patient to assess and evaluate symptoms and progress in treatment, Medication management and Plan Patient is seen and examined.  Patient is a 57 year old female with the above-stated past psychiatric history seen in follow-up.  She remains significantly depressed.  Her Trintellix was increased recently and she is a 15 mg p.o. daily.  ECT consultation is still pending, and hopefully Dr. Weber Cooks will be able to see the patient tomorrow or Thursday.  She has several other complaints today.  She is having some burning and numbness and tingling in her feet.  Most likely this is secondary to her spine disease.  I am going to increase her Neurontin to 400 mg p.o. 3 times daily.  Also will add Relafen 500 mg p.o. daily to reduce the swelling her in her wrist and hopefully her spine.  I have ordered physical therapy for her left wrist additionally.  She was placed on Cytomel this week to augment her  antidepressant treatment, and so far she seems to be tolerating that.  No other changes in her medications.  Hopefully she will tolerate all of these.  Hopefully she will be able to get to [facility] and get the ECT done to turn her around.  Sharma Covert, MD 11/14/2017, 12:46 PM

## 2017-11-14 NOTE — Progress Notes (Signed)
Adult Psychoeducational Group Note  Date:  11/14/2017 Time:  2:18 AM  Group Topic/Focus:  Wrap-Up Group:   The focus of this group is to help patients review their daily goal of treatment and discuss progress on daily workbooks.  Participation Level:  Active  Participation Quality:  Appropriate  Affect:  Appropriate  Cognitive:  Appropriate  Insight: Appropriate  Engagement in Group:  Engaged  Modes of Intervention:  Discussion  Additional Comments:  Pt met her goal of having a conference call with son, doctor, and social worker and it was determined that she will be going to another facility to assist with her mental health needs.  Pt rated the day at a 8/10.  Crystal Duarte 11/14/2017, 2:18 AM

## 2017-11-14 NOTE — Progress Notes (Signed)
Adult Psychoeducational Group Note  Date:  11/14/2017 Time:  9:27 PM  Group Topic/Focus:  Wrap-Up Group:   The focus of this group is to help patients review their daily goal of treatment and discuss progress on daily workbooks.  Participation Level:  Active  Participation Quality:  Appropriate  Affect:  Appropriate  Cognitive:  Alert and Oriented  Insight: Appropriate  Engagement in Group:  Improving  Modes of Intervention:  Clarification, Exploration and Support  Additional Comments:  Pt verbalized that she was waiting for transfers. Pt verbalized that she has been frustrated today. Patient rated her day a 7.  Russie Gulledge, Patrick North 11/14/2017, 9:27 PM

## 2017-11-15 ENCOUNTER — Inpatient Hospital Stay (HOSPITAL_COMMUNITY): Payer: Medicare HMO

## 2017-11-15 LAB — URINALYSIS, COMPLETE (UACMP) WITH MICROSCOPIC
Bilirubin Urine: NEGATIVE
Glucose, UA: NEGATIVE mg/dL
Ketones, ur: NEGATIVE mg/dL
Leukocytes, UA: NEGATIVE
Nitrite: NEGATIVE
Protein, ur: NEGATIVE mg/dL
Specific Gravity, Urine: 1.009 (ref 1.005–1.030)
pH: 6 (ref 5.0–8.0)

## 2017-11-15 MED ORDER — GABAPENTIN 400 MG PO CAPS
400.0000 mg | ORAL_CAPSULE | Freq: Three times a day (TID) | ORAL | Status: DC
  Administered 2017-11-15 – 2017-11-16 (×4): 400 mg via ORAL
  Filled 2017-11-15 (×8): qty 1

## 2017-11-15 NOTE — Progress Notes (Signed)
Pt presents with a flat affect and depressed mood. Pt rated on her self inventory sheet: depression 5/10, anxiety 7/10 and hopelessness 7/10. Pt denies SI/HI. Pt expressed feeling overwhelmed by the process of waiting for ECT. Pt expressed that when she gets overwhelmed, it triggers thoughts of suicide. Pt verbalized that if she becomes suicidal, that she will notify writer immediately. Pt stated goal for today, "getting transferred to [facility] for ECT.  Orders reviewed with pt. Verbal support provided. Pt encouraged to attend groups. 15 minute checks performed for safety.  Pt compliant with tx plan.

## 2017-11-15 NOTE — Plan of Care (Signed)
  Problem: Coping: Goal: Ability to demonstrate self-control will improve Outcome: Progressing   Problem: Coping: Goal: Will verbalize feelings Outcome: Progressing

## 2017-11-15 NOTE — Evaluation (Signed)
Occupational Therapy Evaluation Patient Details Name: Jenisa Monty MRN: [id number] DOB: October 02, 1960 Today's Date: 11/15/2017    History of Present Illness   Patient is a 57 year old female with a long-standing past psychiatric history significant for major depression; recurrent, severe without psychotic features who presented to the [facility] emergency department with suicidal ideation after an intentional overdose. Pt with significant hx of L radial styloid fracture, now with complaints of ulnar pain and possible fracture.   Clinical Impression   Met with pt, agreeable to OT. Pt presents with increased pain and extrinsic tightness of L wrist. This prevents her from opening packages, opening doors, and completing other fine motor tasks. Pt received one visit of OP PT after removal of cast before [facility] admission. Pt overly concerned therapy will worsen the state of her L wrist. Noted increased edema over outer aspect of  L ulna/ulnar styloid. Original fracture and casting previously done to treat L radial styloid closed fx. Pt now reporting that L ulna is also fractures (from x-ray per pt report on 7/12). OT unable to access x-ray results from chart on 7/12, deferred active and passive movement at L wrist. Educated pt on hand exercises to mitigate further tightness to increase function in BADL. Tendon glides and composite digit flexion exercises completed. Pt with velcro immobilizing brace, encouraged pt to wear when engaging in outside activity when on unit, but to defer wearing to prevent further extrinsic tightness. Consulted with MD to obtain new X-ray to confirm status of L ulna. Will continue to monitor pt while on unit to increase functional movement and ROM in L wrist to increase functional engagement in daily activities- pending X-ray results.    Follow Up Recommendations  Outpatient OT    Equipment Recommendations  None recommended by OT    Recommendations for Other Services        Precautions / Restrictions Precautions Precautions: None Restrictions Other Position/Activity Restrictions: pending x-ray results      Mobility Bed Mobility Overal bed mobility: Modified Independent                Transfers Overall transfer level: Modified independent                    Balance                                           ADL either performed or assessed with clinical judgement   ADL Overall ADL's : Needs assistance/impaired Eating/Feeding: Set up Eating/Feeding Details (indicate cue type and reason): difficulty with packages Grooming: Modified independent   Upper Body Bathing: Modified independent   Lower Body Bathing: Modified independent   Upper Body Dressing : Set up Upper Body Dressing Details (indicate cue type and reason): difficulty with buttons, fasteners Lower Body Dressing: Set up Lower Body Dressing Details (indicate cue type and reason): difficulty with buttons, fasteners- been wearing pull up pants Toilet Transfer: Modified Independent   Toileting- Clothing Manipulation and Hygiene: Modified independent   Tub/ Shower Transfer: Modified independent     General ADL Comments: Needs A for finemotor tasks and any WB in L wrist     Vision Baseline Vision/History: Wears glasses Wears Glasses: At all times Patient Visual Report: No change from baseline       Perception     Praxis      Pertinent Vitals/Pain Pain Assessment: 0-10 Pain  Location: localized over L ulnar styloid Pain Descriptors / Indicators: Aching;Sharp Pain Intervention(s): Limited activity within patient's tolerance;Monitored during session;Relaxation;Ice applied     Hand Dominance Right   Extremity/Trunk Assessment Upper Extremity Assessment Upper Extremity Assessment: LUE deficits/detail;RUE deficits/detail RUE Deficits / Details: functional LUE Deficits / Details: minimal AROM/PROM in L wrist 2/2 extrinsic tightness and increased  pain in ulna LUE: Unable to fully assess due to pain   Lower Extremity Assessment Lower Extremity Assessment: Overall WFL for tasks assessed       Communication Communication Communication: No difficulties   Cognition Arousal/Alertness: Awake/alert Behavior During Therapy: WFL for tasks assessed/performed Overall Cognitive Status: Within Functional Limits for tasks assessed                                 General Comments: somewhat histrionic, somatic in complaints, difficult to determine definitive pain    General Comments  focal swelling over L ulna, red and somewhat warm to touch    Exercises Other Exercises Other Exercises: tendon glides x10, composite digit flexion x10   Shoulder Instructions      Home Living Family/patient expects to be discharged to:: Private residence Living Arrangements: Children Available Help at Discharge: Family Type of Home: House       Home Layout: Two level     Bathroom Shower/Tub: Teacher, early years/pre: Standard     Home Equipment: None          Prior Functioning/Environment Level of Independence: Independent                 OT Problem List: Decreased strength;Increased edema;Decreased range of motion;Decreased coordination;Impaired UE functional use;Pain      OT Treatment/Interventions: Therapeutic exercise;Therapeutic activities    OT Goals(Current goals can be found in the care plan section) Acute Rehab OT Goals Patient Stated Goal: to regain mobility of L wrist OT Goal Formulation: With patient Time For Goal Achievement: 11/29/17 Potential to Achieve Goals: Good  OT Frequency: Min 1X/week   Barriers to D/C:            Co-evaluation              AM-PAC PT "6 Clicks" Daily Activity     Outcome Measure Help from another person eating meals?: A Little Help from another person taking care of personal grooming?: A Little Help from another person toileting, which includes using  toliet, bedpan, or urinal?: None Help from another person bathing (including washing, rinsing, drying)?: None Help from another person to put on and taking off regular upper body clothing?: A Little Help from another person to put on and taking off regular lower body clothing?: A Little 6 Click Score: 20   End of Session Nurse Communication: Mobility status  Activity Tolerance: Patient limited by pain Patient left: Other (comment)(to dayroom in [facility] unit)  OT Visit Diagnosis: Pain Pain - Right/Left: Left Pain - part of body: Hand                Time: 1439-1500 OT Time Calculation (min): 21 min Charges:  OT General Charges $OT Visit: 1 Visit OT Evaluation $OT Eval Low Complexity: 1 Low OT Treatments $Therapeutic Exercise: 8-22 mins  [name], MSOT, OTR/L  [place] 11/15/2017, 4:09 PM

## 2017-11-15 NOTE — Progress Notes (Signed)
[facility] MD Progress Note  11/15/2017 2:21 PM Crystal Duarte  MRN:  [id number] Subjective: Patient is seen and examined.  Patient is a 57 year old female with a long-standing past psychiatric history significant for major depression; severe.  She is seen in follow-up.  From a mood standpoint she is essentially unchanged.  We are still waiting for Dr. Weber Cooks to see her for an ECT consult.  She did get capsaicin cream last night for the peripheral neuropathy, and that really helped her feet.  I did increase her Neurontin yesterday, but the increased dosage is led to some daytime sedation.  She otherwise continues on Trintellix at 15 mg a day.  She continues to have helplessness, hopelessness and worthlessness.  She denied any gross suicidal ideation today.  She has been seen by physical therapy and they are going to start working on her wrist. Principal Problem: <principal problem not specified> Diagnosis:   Patient Active Problem List   Diagnosis Date Noted  . Severe episode of recurrent major depressive disorder, without psychotic features ([place]) [F33.2] 07/18/2017  . DOE (dyspnea on exertion) [R06.09] 12/26/2016  . COPD GOLD II  [J44.9] 12/26/2016  . Orthostatic hypotension [I95.1] 12/14/2016  . Chest tightness [R07.89] 12/14/2016  . Weight loss [R63.4] 12/14/2016  . Routine general medical examination at a health care facility [Z00.00] 10/09/2015  . 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 Hx of adenomatous polyp of colon [Z86.010] 12/04/2014  . Memory loss [R41.3] 10/04/2014  . Affective bipolar disorder ([place]) [F31.9] 03/12/2014  . Peripheral vascular disease, unspecified ([place]) [I73.9] 10/10/2013  . Smoker [F17.200] 08/28/2012  . Menopause [Z78.0] 08/28/2012  . Multiple pulmonary nodules [R91.8] 11/12/2010   Total Time spent with patient: 15 minutes  Past Psychiatric History: See admission H&P  Past Medical History:  Past Medical History:  Diagnosis Date  . Coccidioidomycosis,  pulmonary ([place])   . Depression   . Hx of adenomatous polyp of colon 12/04/2014  . Osteopenia 11/2016   T score -2.2 FRAX 17%/2.4%  . Panic attack     Past Surgical History:  Procedure Laterality Date  . ABDOMINAL HYSTERECTOMY    . COLONOSCOPY  2001   hemorrhoidectomy  . LUNG SURGERY  2001   VATS surgery   . OVARIAN CYST REMOVAL  1970's  . VESICOVAGINAL FISTULA CLOSURE W/ TAH  2005   Family History:  Family History  Problem Relation Age of Onset  . Asthma Mother   . Ovarian cancer Mother   . Cancer Mother        OVARIAN  . Varicose Veins Mother   . Heart disease Father   . Peripheral vascular disease Father   . Cancer Maternal Grandmother        LUNG- LUNG  . Colon cancer Neg Hx    Family Psychiatric  History: See admission H&P Social History:  Social History   Substance and Sexual Activity  Alcohol Use No  . Alcohol/week: 0.0 standard drinks     Social History   Substance and Sexual Activity  Drug Use No    Social History   Socioeconomic History  . Marital status: Divorced    Spouse name: Not on file  . Number of children: 2  . Years of education: Not on file  . Highest education level: Not on file  Occupational History  . Occupation: Forensic psychologist: [employer]  . Financial resource strain: Not on file  . Food insecurity:    Worry: Not on  file    Inability: Not on file  . Transportation needs:    Medical: Not on file    Non-medical: Not on file  Tobacco Use  . Smoking status: Current Some Day Smoker    Packs/day: 1.00    Years: 42.00    Pack years: 42.00    Types: Cigarettes    Last attempt to quit: 09/25/2016    Years since quitting: 1.1  . Smokeless tobacco: Former Systems developer    Quit date: 06/13/2016  Substance and Sexual Activity  . Alcohol use: No    Alcohol/week: 0.0 standard drinks  . Drug use: No  . Sexual activity: Not on file  Lifestyle  . Physical activity:    Days per week: Not on file    Minutes per  session: Not on file  . Stress: Not on file  Relationships  . Social connections:    Talks on phone: Not on file    Gets together: Not on file    Attends religious service: Not on file    Active member of club or organization: Not on file    Attends meetings of clubs or organizations: Not on file    Relationship status: Not on file  Other Topics Concern  . Not on file  Social History Narrative  . Not on file   Additional Social History:                         Sleep: Good  Appetite:  Fair  Current Medications: Current Facility-Administered Medications  Medication Dose Route Frequency Provider Last Rate Last Dose  . acetaminophen (TYLENOL) tablet 650 mg  650 mg Oral Q6H PRN Ethelene Hal, NP   650 mg at 11/11/17 2135  . acidophilus (RISAQUAD) capsule 1 capsule  1 capsule Oral Daily Sharma Covert, MD   1 capsule at 11/15/17 0807  . alum & mag hydroxide-simeth (MAALOX/MYLANTA) 200-200-20 MG/5ML suspension 30 mL  30 mL Oral Q4H PRN Ethelene Hal, NP      . busPIRone (BUSPAR) tablet 15 mg  15 mg Oral TID Sharma Covert, MD   15 mg at 11/15/17 1208  . capsaicin (ZOSTRIX) 0.025 % cream   Topical BID Sharma Covert, MD      . diclofenac sodium (VOLTAREN) 1 % transdermal gel 2 g  2 g Topical QID Sharma Covert, MD   2 g at 11/10/17 1711  . gabapentin (NEURONTIN) capsule 400 mg  400 mg Oral TID Sharma Covert, MD      . hydrOXYzine (ATARAX/VISTARIL) tablet 25 mg  25 mg Oral TID PRN Ethelene Hal, NP   25 mg at 11/14/17 2151  . liothyronine (CYTOMEL) tablet 12.5 mcg  12.5 mcg Oral Daily Cobos, Myer Peer, MD   12.5 mcg at 11/15/17 1035  . magnesium hydroxide (MILK OF MAGNESIA) suspension 30 mL  30 mL Oral Daily PRN Ethelene Hal, NP      . nabumetone (RELAFEN) tablet 500 mg  500 mg Oral BID Sharma Covert, MD   500 mg at 11/15/17 1017  . pneumococcal 23 valent vaccine (PNU-IMMUNE) injection 0.5 mL  0.5 mL Intramuscular  Tomorrow-1000 Sharma Covert, MD      . propranolol (INDERAL) tablet 20 mg  20 mg Oral TID Sharma Covert, MD   20 mg at 11/15/17 1208  . traZODone (DESYREL) tablet 200 mg  200 mg Oral QHS PRN Laverle Hobby, PA-C  200 mg at 11/14/17 2151  . vortioxetine HBr (TRINTELLIX) tablet 15 mg  15 mg Oral Daily Cobos, Myer Peer, MD   15 mg at 11/15/17 6734    Lab Results:  Results for orders placed or performed during the hospital encounter of 11/03/17 (from the past 48 hour(s))  Urinalysis, Complete w Microscopic     Status: Abnormal   Collection Time: 11/14/17  6:44 PM  Result Value Ref Range   Color, Urine STRAW (A) YELLOW   APPearance CLEAR CLEAR   Specific Gravity, Urine 1.009 1.005 - 1.030   pH 6.0 5.0 - 8.0   Glucose, UA NEGATIVE NEGATIVE mg/dL   Hgb urine dipstick SMALL (A) NEGATIVE   Bilirubin Urine NEGATIVE NEGATIVE   Ketones, ur NEGATIVE NEGATIVE mg/dL   Protein, ur NEGATIVE NEGATIVE mg/dL   Nitrite NEGATIVE NEGATIVE   Leukocytes, UA NEGATIVE NEGATIVE   RBC / HPF 0-5 0 - 5 RBC/hpf   WBC, UA 0-5 0 - 5 WBC/hpf   Bacteria, UA RARE (A) NONE SEEN   Squamous Epithelial / LPF 0-5 0 - 5    Comment: Performed at [facility], [place] [address] [place], [place] [address]    Blood Alcohol level:  Lab Results  Component Value Date   ETH <10 11/02/2017   ETH <10 02/40/9735    Metabolic Disorder Labs: Lab Results  Component Value Date   HGBA1C 5.4 07/21/2017   MPG 108.28 07/21/2017   MPG 114 08/28/2012   No results found for: PROLACTIN Lab Results  Component Value Date   CHOL 200 07/21/2017   TRIG 208 (H) 07/21/2017   HDL 54 07/21/2017   CHOLHDL 3.7 07/21/2017   VLDL 42 (H) 07/21/2017   LDLCALC 104 (H) 07/21/2017   LDLCALC 119 (H) 10/08/2015    Physical Findings: AIMS:  , ,  ,  ,    CIWA:    COWS:     Musculoskeletal: Strength & Muscle Tone: within normal limits Gait & Station: normal Patient leans: N/A  Psychiatric Specialty  Exam: Physical Exam  Nursing note and vitals reviewed. Constitutional: She is oriented to person, place, and time. She appears well-developed and well-nourished.  HENT:  Head: Normocephalic and atraumatic.  Respiratory: Effort normal.  Neurological: She is alert and oriented to person, place, and time.    ROS  Blood pressure 109/68, pulse 71, temperature 98.1 F (36.7 C), temperature source Oral, resp. rate 16, height 5\' 4"  (1.626 m), weight 76.7 kg, SpO2 97 %.Body mass index is 29.01 kg/m.  General Appearance: Casual  Eye Contact:  Fair  Speech:  Normal Rate  Volume:  Decreased  Mood:  Depressed  Affect:  Congruent  Thought Process:  Coherent  Orientation:  Full (Time, Place, and Person)  Thought Content:  Logical  Suicidal Thoughts:  Yes.  without intent/plan  Homicidal Thoughts:  No  Memory:  Immediate;   Fair Recent;   Fair Remote;   Fair  Judgement:  Impaired  Insight:  Fair  Psychomotor Activity:  Normal  Concentration:  Concentration: Fair and Attention Span: Fair  Recall:  [employer] of Knowledge:  Fair  Language:  Fair  Akathisia:  Negative  Handed:  Right  AIMS (if indicated):     Assets:  Communication Skills Desire for Improvement Financial Resources/Insurance Housing Physical Health Resilience  ADL's:  Intact  Cognition:  WNL  Sleep:  Number of Hours: 6.25     Treatment Plan Summary: Daily contact with patient to assess and evaluate  symptoms and progress in treatment, Medication management and Plan : Patient is seen and examined.  Patient is a 57 year old female with the above-stated past psychiatric and medical history seen in follow-up.  Her depression is essentially unchanged.  We are still waiting for the consult from Dr. Weber Cooks.  Hopefully today or tomorrow.  Her peripheral neuropathy pain has improved with the capsaicin.  That will be continued.  The oral Neurontin has sedated her during the day, so I will decrease that back to 400 mg p.o. 3 times  daily.  No other changes in her medications and hopefully we can get her to [facility] for ECT ASAP.  Her urinalysis on repeat was completely normal.  Sharma Covert, MD 11/15/2017, 2:21 PM

## 2017-11-15 NOTE — Therapy (Signed)
Occupational Therapy Group Note  Date:  11/15/2017 Time:  3:58 PM  Group Topic/Focus:  Self Care  Participation Level:  Active  Participation Quality:  Appropriate  Affect:  Flat  Cognitive:  Appropriate  Insight: Improving  Engagement in Group:  Engaged  Modes of Intervention:  Activity, Discussion, Education and Socialization  Additional Comments:    S: "I need to be better about my meals and meet new people"  O: Education given on self care and its importance in supporting mental health. Self care assessment given as a self rating scale for pt to identify areas of strength and weakness. Discussion given in how to improve current practices.  ?  A: Pt presents to group with flat affect, engaged and participatory throughout. Pt identifies physical and social areas to improve self care. ?  P: Education given on self care, handouts given to facilitate carryover into community.     [name], MSOT, OTR/L  [place] 11/15/2017, 3:58 PM

## 2017-11-15 NOTE — Progress Notes (Signed)
Nursing Progress Note: 7p-7a D: Pt currently presents with a anxious/pleasant affect and behavior. Interacting appropriately with the milieu. Pt reports good sleep during the previous night with current medication regimen. Pt did attend wrap-up group.  A: Pt provided with medications per providers orders. Pt's labs and vitals were monitored throughout the night. Pt supported emotionally and encouraged to express concerns and questions. Pt educated on medications.  R: Pt's safety ensured with 15 minute and environmental checks. Pt currently denies SI, HI, and AVH. Pt verbally contracts to seek staff if SI,HI, or AVH occurs and to consult with staff before acting on any harmful thoughts. Will continue to monitor.  

## 2017-11-15 NOTE — Plan of Care (Signed)
  Problem: Activity: Goal: Sleeping patterns will improve Outcome: Progressing-She reported difficulty sleeping last night.  She took medications for sleep and anxiety this evening and is currently resting with her eyes closed.  She appears to be asleep.

## 2017-11-15 NOTE — Tx Team (Signed)
Interdisciplinary Treatment and Diagnostic Plan Update  11/15/2017 Time of Session: [id number] Crystal Duarte MRN: [id number]  Principal Diagnosis: MDD, recurrent, severe, without psychotic features  Secondary Diagnoses: Active Problems:   Severe episode of recurrent major depressive disorder, without psychotic features ([place])   Current Medications:  Current Facility-Administered Medications  Medication Dose Route Frequency Provider Last Rate Last Dose  . acetaminophen (TYLENOL) tablet 650 mg  650 mg Oral Q6H PRN Ethelene Hal, NP   650 mg at 11/11/17 2135  . acidophilus (RISAQUAD) capsule 1 capsule  1 capsule Oral Daily Sharma Covert, MD   1 capsule at 11/15/17 0807  . alum & mag hydroxide-simeth (MAALOX/MYLANTA) 200-200-20 MG/5ML suspension 30 mL  30 mL Oral Q4H PRN Ethelene Hal, NP      . busPIRone (BUSPAR) tablet 15 mg  15 mg Oral TID Sharma Covert, MD   15 mg at 11/15/17 0807  . capsaicin (ZOSTRIX) 0.025 % cream   Topical BID Sharma Covert, MD      . diclofenac sodium (VOLTAREN) 1 % transdermal gel 2 g  2 g Topical QID Sharma Covert, MD   2 g at 11/10/17 1711  . gabapentin (NEURONTIN) capsule 600 mg  600 mg Oral TID Sharma Covert, MD   600 mg at 11/15/17 8938  . hydrOXYzine (ATARAX/VISTARIL) tablet 25 mg  25 mg Oral TID PRN Ethelene Hal, NP   25 mg at 11/14/17 2151  . liothyronine (CYTOMEL) tablet 12.5 mcg  12.5 mcg Oral Daily Cobos, Myer Peer, MD   12.5 mcg at 11/15/17 1035  . magnesium hydroxide (MILK OF MAGNESIA) suspension 30 mL  30 mL Oral Daily PRN Ethelene Hal, NP      . nabumetone (RELAFEN) tablet 500 mg  500 mg Oral BID Sharma Covert, MD   500 mg at 11/15/17 1017  . pneumococcal 23 valent vaccine (PNU-IMMUNE) injection 0.5 mL  0.5 mL Intramuscular Tomorrow-1000 Sharma Covert, MD      . propranolol (INDERAL) tablet 20 mg  20 mg Oral TID Sharma Covert, MD   20 mg at 11/15/17 0807  . traZODone (DESYREL) tablet  200 mg  200 mg Oral QHS PRN Laverle Hobby, PA-C   200 mg at 11/14/17 2151  . vortioxetine HBr (TRINTELLIX) tablet 15 mg  15 mg Oral Daily Cobos, Myer Peer, MD   15 mg at 11/15/17 0807   PTA Medications: Medications Prior to Admission  Medication Sig Dispense Refill Last Dose  . acetaminophen (TYLENOL) 500 MG tablet Take 2 tablets (1,000 mg total) by mouth every 6 (six) hours as needed. 30 tablet 0 Past Week at Unknown time  . albuterol (PROVENTIL HFA;VENTOLIN HFA) 108 (90 Base) MCG/ACT inhaler Inhale 2 puffs into the lungs every 4 (four) hours as needed for wheezing or shortness of breath. 1 Inhaler 0 Past Week at Unknown time  . busPIRone (BUSPAR) 15 MG tablet Take 15 mg by mouth 3 (three) times daily.   Past Week at Unknown time  . DULoxetine (CYMBALTA) 60 MG capsule Take 1 capsule (60 mg total) by mouth 2 (two) times daily. 60 capsule 0 Past Week at Unknown time  . fluticasone (FLONASE) 50 MCG/ACT nasal spray Place 2 sprays into both nostrils daily. (Patient not taking: Reported on 11/02/2017) 16 g 2 Not Taking at Unknown time  . gabapentin (NEURONTIN) 400 MG capsule Take 1 capsule (400 mg total) by mouth 3 (three) times daily. (Patient not taking: Reported on 11/02/2017)  90 capsule 0 Not Taking at Unknown time  . naproxen (NAPROSYN) 500 MG tablet Take 1 tablet (500 mg total) by mouth 2 (two) times daily. (Patient not taking: Reported on 11/02/2017) 30 tablet 0 Not Taking at Unknown time  . Naproxen Sod-diphenhydrAMINE (ALEVE PM PO) Take 15 tablets by mouth once.   Past Week at Unknown time  . orphenadrine (NORFLEX) 100 MG tablet Take 1 tablet (100 mg total) by mouth 2 (two) times daily. (Patient not taking: Reported on 11/02/2017) 30 tablet 0 Not Taking at Unknown time  . oxyCODONE-acetaminophen (PERCOCET/ROXICET) 5-325 MG tablet Take 1 tablet by mouth every 6 (six) hours as needed for severe pain. (Patient not taking: Reported on 11/02/2017) 12 tablet 0 Not Taking at Unknown time  . propranolol  (INDERAL) 20 MG tablet Take 20 mg by mouth 3 (three) times daily.   Past Week at Unknown time  . QUEtiapine (SEROQUEL) 300 MG tablet Take 1 tablet (300 mg total) by mouth at bedtime. (Patient not taking: Reported on 11/02/2017) 30 tablet 0 Not Taking at Unknown time  . traMADol (ULTRAM) 50 MG tablet 2 tablets every 6 hours may be taken in combination with acetaminophen and naproxen. (Patient not taking: Reported on 11/02/2017) 20 tablet 0 Not Taking at Unknown time  . traZODone (DESYREL) 100 MG tablet Take 1 tablet (100 mg total) by mouth at bedtime as needed and may repeat dose one time if needed for sleep. 30 tablet 0 11/01/2017 at Unknown time    Patient Stressors: Financial difficulties Health problems  Patient Strengths: Ability for insight Average or above average intelligence Capable of independent living Communication skills Supportive family/friends  Treatment Modalities: Medication Management, Group therapy, Case management,  1 to 1 session with clinician, Psychoeducation, Recreational therapy.   Physician Treatment Plan for Primary Diagnosis: MDD, recurrent, severe, without psychotic features Long Term Goal(s): Improvement in symptoms so as ready for discharge Improvement in symptoms so as ready for discharge   Short Term Goals: Ability to identify changes in lifestyle to reduce recurrence of condition will improve Ability to verbalize feelings will improve Ability to disclose and discuss suicidal ideas Ability to demonstrate self-control will improve Ability to identify and develop effective coping behaviors will improve Ability to maintain clinical measurements within normal limits will improve Ability to identify changes in lifestyle to reduce recurrence of condition will improve Ability to verbalize feelings will improve Ability to disclose and discuss suicidal ideas Ability to demonstrate self-control will improve Ability to identify and develop effective coping behaviors  will improve  Medication Management: Evaluate patient's response, side effects, and tolerance of medication regimen.  Therapeutic Interventions: 1 to 1 sessions, Unit Group sessions and Medication administration.  Evaluation of Outcomes: Progressing  Physician Treatment Plan for Secondary Diagnosis: Active Problems:   Severe episode of recurrent major depressive disorder, without psychotic features ([place])  Long Term Goal(s): Improvement in symptoms so as ready for discharge Improvement in symptoms so as ready for discharge   Short Term Goals: Ability to identify changes in lifestyle to reduce recurrence of condition will improve Ability to verbalize feelings will improve Ability to disclose and discuss suicidal ideas Ability to demonstrate self-control will improve Ability to identify and develop effective coping behaviors will improve Ability to maintain clinical measurements within normal limits will improve Ability to identify changes in lifestyle to reduce recurrence of condition will improve Ability to verbalize feelings will improve Ability to disclose and discuss suicidal ideas Ability to demonstrate self-control will improve Ability to identify  and develop effective coping behaviors will improve     Medication Management: Evaluate patient's response, side effects, and tolerance of medication regimen.  Therapeutic Interventions: 1 to 1 sessions, Unit Group sessions and Medication administration.  Evaluation of Outcomes: Progressing   RN Treatment Plan for Primary Diagnosis: MDD, recurrent, severe, without psychotic features Long Term Goal(s): Knowledge of disease and therapeutic regimen to maintain health will improve  Short Term Goals: Ability to disclose and discuss suicidal ideas, Ability to identify and develop effective coping behaviors will improve and Compliance with prescribed medications will improve  Medication Management: RN will administer medications as  ordered by provider, will assess and evaluate patient's response and provide education to patient for prescribed medication. RN will report any adverse and/or side effects to prescribing provider.  Therapeutic Interventions: 1 on 1 counseling sessions, Psychoeducation, Medication administration, Evaluate responses to treatment, Monitor vital signs and CBGs as ordered, Perform/monitor CIWA, COWS, AIMS and Fall Risk screenings as ordered, Perform wound care treatments as ordered.  Evaluation of Outcomes: Progressing   LCSW Treatment Plan for Primary Diagnosis: MDD, recurrent, severe, without psychotic features Long Term Goal(s): Safe transition to appropriate next level of care at discharge, Engage patient in therapeutic group addressing interpersonal concerns.  Short Term Goals: Engage patient in aftercare planning with referrals and resources  Therapeutic Interventions: Assess for all discharge needs, 1 to 1 time with Social worker, Explore available resources and support systems, Assess for adequacy in community support network, Educate family and significant other(s) on suicide prevention, Complete Psychosocial Assessment, Interpersonal group therapy.  Evaluation of Outcomes: Progressing   Progress in Treatment: Attending groups: Yes Participating in groups: No. Taking medication as prescribed: Yes. Toleration medication: Yes. Family/Significant other contact made: Yes, individual(s) contacted:  son Patient understands diagnosis: Yes. Discussing patient identified problems/goals with staff: Yes. Medical problems stabilized or resolved: Yes. Denies suicidal/homicidal ideation: Yes. Issues/concerns per patient self-inventory: No. Other:   New problem(s) identified: None  New Short Term/Long Term Goal(s):medication stabilization, elimination of SI thoughts, development of comprehensive mental wellness plan.   Patient Goals:   I want to get out of this hopelessness  Discharge Plan  or Barriers: Pt in review for ECT/Dr. Clapacs. [place] pamphlet, Mobile Crisis information provided to patient for additional community support and resources.   Reason for Continuation of Hospitalization: Depression Medication stabilization Suicidal ideation  Estimated Length of Stay: Friday, 11/17/17  Attendees: Patient: 11/10/2017   Physician: Dr. Mallie Darting, MD 11/10/2017   Nursing: Chrys Racer RN 11/10/2017   RN Care Manager:x 11/10/2017   Social Worker: Janice Norrie LCSW 11/10/2017   Recreational Therapist: x 11/10/2017   Other: Lindell Spar NP; Cecille Rubin NP 11/10/2017   Other:  11/10/2017   Other: 11/10/2017    Scribe for Treatment Team: Avelina Laine, LCSW 11/15/2017 10:59 AM

## 2017-11-15 NOTE — Progress Notes (Signed)
D:  Crystal Duarte was up and visible on the unit.  Minimal interaction with staff or peers.  She was pleasant and cooperative.  She denied SI/HI or A/V hallucinations.  She is hoping to hear something soon about the possibility of going to [facility] unit to have ECT completed.  She denied pain or discomfort and appeared to be in no physical distress.  She took prn medication for sleep and anxiety tonight.  She is currently resting with her eyes closed and appears to be asleep. A:  1:1 with RN for support and encouragement.  Medications as ordered.  Q 15 minute checks maintained for safety.  Encouraged participation in group and unit activities.   R:  Crystal Duarte remains safe on the unit.  We will continue to monitor the progress towards her goals.

## 2017-11-15 NOTE — Progress Notes (Signed)
Adult Psychoeducational Group Note  Date:  11/15/2017 Time:  10:13 PM  Group Topic/Focus:  Wrap-Up Group:   The focus of this group is to help patients review their daily goal of treatment and discuss progress on daily workbooks.  Participation Level:  Active  Participation Quality:  Appropriate  Affect:  Appropriate  Cognitive:  Alert and Oriented  Insight: Appropriate  Engagement in Group:  Developing/Improving  Modes of Intervention:  Clarification, Exploration and Support  Additional Comments:  Pt verbalized that she is waiting to hear about being transferred. Pt rated her day an 8. Pt verbalized that something positive is that the doctor is back from the vacation.  Crystal Duarte, Patrick North 11/15/2017, 10:13 PM

## 2017-11-16 ENCOUNTER — Inpatient Hospital Stay
Admission: AD | Admit: 2017-11-16 | Discharge: 2017-11-23 | DRG: 885 | Disposition: A | Discharge status: Home / Self Care | Payer: Medicare HMO | Source: Intra-hospital | Attending: Psychiatry | Admitting: Psychiatry

## 2017-11-16 ENCOUNTER — Encounter (HOSPITAL_COMMUNITY): Payer: Self-pay | Admitting: Behavioral Health

## 2017-11-16 ENCOUNTER — Other Ambulatory Visit: Payer: Self-pay

## 2017-11-16 DIAGNOSIS — Z90710 Acquired absence of both cervix and uterus: Secondary | ICD-10-CM | POA: Diagnosis not present

## 2017-11-16 DIAGNOSIS — Z79899 Other long term (current) drug therapy: Secondary | ICD-10-CM

## 2017-11-16 DIAGNOSIS — T1491XA Suicide attempt, initial encounter: Secondary | ICD-10-CM | POA: Diagnosis not present

## 2017-11-16 DIAGNOSIS — Z818 Family history of other mental and behavioral disorders: Secondary | ICD-10-CM

## 2017-11-16 DIAGNOSIS — Z609 Problem related to social environment, unspecified: Secondary | ICD-10-CM | POA: Diagnosis not present

## 2017-11-16 DIAGNOSIS — R45851 Suicidal ideations: Secondary | ICD-10-CM | POA: Diagnosis not present

## 2017-11-16 DIAGNOSIS — Z825 Family history of asthma and other chronic lower respiratory diseases: Secondary | ICD-10-CM | POA: Diagnosis not present

## 2017-11-16 DIAGNOSIS — J449 Chronic obstructive pulmonary disease, unspecified: Secondary | ICD-10-CM | POA: Diagnosis present

## 2017-11-16 DIAGNOSIS — M8580 Other specified disorders of bone density and structure, unspecified site: Secondary | ICD-10-CM | POA: Diagnosis present

## 2017-11-16 DIAGNOSIS — F332 Major depressive disorder, recurrent severe without psychotic features: Secondary | ICD-10-CM | POA: Diagnosis not present

## 2017-11-16 DIAGNOSIS — R69 Illness, unspecified: Secondary | ICD-10-CM | POA: Diagnosis not present

## 2017-11-16 DIAGNOSIS — Z882 Allergy status to sulfonamides status: Secondary | ICD-10-CM

## 2017-11-16 DIAGNOSIS — Z915 Personal history of self-harm: Secondary | ICD-10-CM | POA: Diagnosis not present

## 2017-11-16 DIAGNOSIS — M791 Myalgia, unspecified site: Secondary | ICD-10-CM | POA: Diagnosis not present

## 2017-11-16 DIAGNOSIS — F17210 Nicotine dependence, cigarettes, uncomplicated: Secondary | ICD-10-CM | POA: Diagnosis present

## 2017-11-16 DIAGNOSIS — Z888 Allergy status to other drugs, medicaments and biological substances status: Secondary | ICD-10-CM

## 2017-11-16 DIAGNOSIS — T50902A Poisoning by unspecified drugs, medicaments and biological substances, intentional self-harm, initial encounter: Secondary | ICD-10-CM | POA: Diagnosis not present

## 2017-11-16 MED ORDER — PROPRANOLOL HCL 20 MG PO TABS
20.0000 mg | ORAL_TABLET | Freq: Three times a day (TID) | ORAL | Status: DC
  Administered 2017-11-17 – 2017-11-23 (×18): 20 mg via ORAL
  Filled 2017-11-16 (×19): qty 1

## 2017-11-16 MED ORDER — HYDROXYZINE HCL 25 MG PO TABS: 25.0000 mg | ORAL_TABLET | Freq: Three times a day (TID) | ORAL | 0 refills | Status: DC | PRN

## 2017-11-16 MED ORDER — GABAPENTIN 400 MG PO CAPS: 400.0000 mg | ORAL_CAPSULE | Freq: Three times a day (TID) | ORAL | 0 refills | Status: DC

## 2017-11-16 MED ORDER — MAGNESIUM HYDROXIDE 400 MG/5ML PO SUSP: 30.0000 mL | Freq: Every day | ORAL | Status: DC | PRN

## 2017-11-16 MED ORDER — HYDROXYZINE HCL 25 MG PO TABS
25.0000 mg | ORAL_TABLET | Freq: Three times a day (TID) | ORAL | Status: DC | PRN
  Administered 2017-11-16 – 2017-11-22 (×10): 25 mg via ORAL
  Filled 2017-11-16 (×10): qty 1

## 2017-11-16 MED ORDER — NABUMETONE 500 MG PO TABS
500.0000 mg | ORAL_TABLET | Freq: Two times a day (BID) | ORAL | Status: DC
  Administered 2017-11-17 – 2017-11-23 (×13): 500 mg via ORAL
  Filled 2017-11-16 (×15): qty 1

## 2017-11-16 MED ORDER — PROPRANOLOL HCL 20 MG PO TABS: 20.0000 mg | ORAL_TABLET | Freq: Three times a day (TID) | ORAL | 0 refills | Status: DC

## 2017-11-16 MED ORDER — RISAQUAD PO CAPS: 1.0000 | ORAL_CAPSULE | Freq: Every day | ORAL | 0 refills | Status: DC

## 2017-11-16 MED ORDER — RISAQUAD PO CAPS
1.0000 | ORAL_CAPSULE | Freq: Every day | ORAL | Status: DC
  Administered 2017-11-17 – 2017-11-23 (×7): 1 via ORAL
  Filled 2017-11-16 (×7): qty 1

## 2017-11-16 MED ORDER — VORTIOXETINE HBR 5 MG PO TABS
15.0000 mg | ORAL_TABLET | Freq: Every day | ORAL | Status: DC
  Administered 2017-11-17 – 2017-11-23 (×7): 15 mg via ORAL
  Filled 2017-11-16 (×8): qty 3

## 2017-11-16 MED ORDER — CAPSAICIN 0.025 % EX CREA: TOPICAL_CREAM | Freq: Two times a day (BID) | CUTANEOUS | 0 refills | Status: DC

## 2017-11-16 MED ORDER — BUSPIRONE HCL 15 MG PO TABS: 15.0000 mg | ORAL_TABLET | Freq: Three times a day (TID) | ORAL | 0 refills | Status: DC

## 2017-11-16 MED ORDER — LIOTHYRONINE SODIUM 25 MCG PO TABS: 12.5000 ug | ORAL_TABLET | Freq: Every day | ORAL | 0 refills | Status: DC

## 2017-11-16 MED ORDER — NABUMETONE 500 MG PO TABS: 500.0000 mg | ORAL_TABLET | Freq: Two times a day (BID) | ORAL | 0 refills | Status: DC

## 2017-11-16 MED ORDER — LIOTHYRONINE SODIUM 25 MCG PO TABS
12.5000 ug | ORAL_TABLET | Freq: Every day | ORAL | Status: DC
  Administered 2017-11-17 – 2017-11-23 (×7): 12.5 ug via ORAL
  Filled 2017-11-16 (×7): qty 1

## 2017-11-16 MED ORDER — VORTIOXETINE HBR 5 MG PO TABS: 15.0000 mg | ORAL_TABLET | Freq: Every day | ORAL | 0 refills | Status: DC

## 2017-11-16 MED ORDER — TRAZODONE HCL 100 MG PO TABS: 200.0000 mg | ORAL_TABLET | Freq: Every evening | ORAL | 0 refills | Status: DC | PRN

## 2017-11-16 MED ORDER — CAPSAICIN 0.025 % EX CREA
TOPICAL_CREAM | Freq: Two times a day (BID) | CUTANEOUS | Status: DC
  Filled 2017-11-16 (×2): qty 60

## 2017-11-16 MED ORDER — DICLOFENAC SODIUM 1 % TD GEL
2.0000 g | Freq: Four times a day (QID) | TRANSDERMAL | Status: DC
  Administered 2017-11-17: 2 g via TOPICAL
  Filled 2017-11-16 (×2): qty 100

## 2017-11-16 MED ORDER — ACETAMINOPHEN 325 MG PO TABS
650.0000 mg | ORAL_TABLET | Freq: Four times a day (QID) | ORAL | Status: DC | PRN
  Administered 2017-11-20 – 2017-11-23 (×2): 650 mg via ORAL
  Filled 2017-11-16 (×2): qty 2

## 2017-11-16 MED ORDER — ALUM & MAG HYDROXIDE-SIMETH 200-200-20 MG/5ML PO SUSP: 30.0000 mL | ORAL | Status: DC | PRN

## 2017-11-16 MED ORDER — BUSPIRONE HCL 15 MG PO TABS
15.0000 mg | ORAL_TABLET | Freq: Three times a day (TID) | ORAL | Status: DC
  Administered 2017-11-17 – 2017-11-23 (×18): 15 mg via ORAL
  Filled 2017-11-16 (×21): qty 1

## 2017-11-16 MED ORDER — GABAPENTIN 400 MG PO CAPS
400.0000 mg | ORAL_CAPSULE | Freq: Three times a day (TID) | ORAL | Status: DC
  Administered 2017-11-17 – 2017-11-23 (×18): 400 mg via ORAL
  Filled 2017-11-16 (×19): qty 1

## 2017-11-16 MED ORDER — ACETAMINOPHEN 325 MG PO TABS: 650.0000 mg | ORAL_TABLET | Freq: Four times a day (QID) | ORAL | Status: DC | PRN

## 2017-11-16 MED ORDER — TRAZODONE HCL 100 MG PO TABS
200.0000 mg | ORAL_TABLET | Freq: Every evening | ORAL | Status: DC | PRN
  Administered 2017-11-16 – 2017-11-22 (×7): 200 mg via ORAL
  Filled 2017-11-16 (×8): qty 2

## 2017-11-16 MED ORDER — DICLOFENAC SODIUM 1 % TD GEL: 2.0000 g | Freq: Four times a day (QID) | TRANSDERMAL | 0 refills | Status: DC

## 2017-11-16 NOTE — Progress Notes (Signed)
Pt presents with a flat affect and depressed/anxious mood. Pt noted to be irritable on approach. Pt expressed that she's just frustrated at this point because she's been in the hosp for 13 days and is ready to start tx, so that she can feel better. Pt denies SI/HI. Pt reported fair sleep last night. Pt compliant with taking meds and denies any side effects.   Medication reviewed with pt. Verbal support provided. Pt encouraged to attend groups. 15 minute checks performed for safety.   Pt compliant with tx plan.

## 2017-11-16 NOTE — BHH Group Notes (Signed)
[facility] Group Notes:  (Nursing/MHT/Case Management/Adjunct)  Date:  11/16/2017  Time:  10:09 PM  Type of Therapy:  Group Therapy  Participation Level:  Did Not Attend   Summary of Progress/Problems: Not on the unit at the time of group.  Barnie Mort 11/16/2017, 10:09 PM
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
## 2017-11-16 NOTE — Discharge Summary (Signed)
Physician Discharge Summary Note  Patient:  Crystal Duarte is an 57 y.o., female MRN:  [id number] DOB:  26-Jan-1961 Patient phone:  [phone] (home)  Patient address:   [place] [address],  Total Time spent with patient: 30 minutes  Date of Admission:  11/03/2017 Date of Discharge: 11/16/2017  Reason for Admission:  suicidal ideation after an intentional overdose. Principal Problem: Severe episode of recurrent major depressive disorder, without psychotic features [facility]) Discharge Diagnoses: Patient Active Problem List   Diagnosis Date Noted  . Severe episode of recurrent major depressive disorder, without psychotic features ([place]) [F33.2] 07/18/2017  . DOE (dyspnea on exertion) [R06.09] 12/26/2016  . COPD GOLD II  [J44.9] 12/26/2016  . Orthostatic hypotension [I95.1] 12/14/2016  . Chest tightness [R07.89] 12/14/2016  . Weight loss [R63.4] 12/14/2016  . Routine general medical examination at a health care facility [Z00.00] 10/09/2015  . 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 Hx of adenomatous polyp of colon [Z86.010] 12/04/2014  . Memory loss [R41.3] 10/04/2014  . Affective bipolar disorder ([place]) [F31.9] 03/12/2014  . Peripheral vascular disease, unspecified ([place]) [I73.9] 10/10/2013  . Smoker [F17.200] 08/28/2012  . Menopause [Z78.0] 08/28/2012  . Multiple pulmonary nodules [R91.8] 11/12/2010    Past Psychiatric History: Patient has several psychiatric hospitalizations.  She was hospitalized here in April 2019.  She had previous hospitalizations at [facility] in 2012, [facility] in 2015, [place] in 2015 and February 2019.  Her outpatient psychiatrist is at [facility].   Past Medical History:  Past Medical History:  Diagnosis Date  . Coccidioidomycosis, pulmonary ([place])   . Depression   . Hx of adenomatous polyp of colon 12/04/2014  . Osteopenia 11/2016   T score -2.2 FRAX 17%/2.4%  . Panic attack     Past Surgical  History:  Procedure Laterality Date  . ABDOMINAL HYSTERECTOMY    . COLONOSCOPY  2001   hemorrhoidectomy  . LUNG SURGERY  2001   VATS surgery   . OVARIAN CYST REMOVAL  1970's  . VESICOVAGINAL FISTULA CLOSURE W/ TAH  2005   Family History:  Family History  Problem Relation Age of Onset  . Asthma Mother   . Ovarian cancer Mother   . Cancer Mother        OVARIAN  . Varicose Veins Mother   . Heart disease Father   . Peripheral vascular disease Father   . Cancer Maternal Grandmother        LUNG- LUNG  . Colon cancer Neg Hx    Family Psychiatric  History: Biological father; depression Social History:  Social History   Substance and Sexual Activity  Alcohol Use No  . Alcohol/week: 0.0 standard drinks     Social History   Substance and Sexual Activity  Drug Use No    Social History   Socioeconomic History  . Marital status: Divorced    Spouse name: Not on file  . Number of children: 2  . Years of education: Not on file  . Highest education level: Not on file  Occupational History  . Occupation: Forensic psychologist: [employer]  . Financial resource strain: Not on file  . Food insecurity:    Worry: Not on file    Inability: Not on file  . Transportation needs:    Medical: Not on file    Non-medical: Not on file  Tobacco Use  . Smoking status: Current Some Day Smoker    Packs/day: 1.00  Years: 42.00    Pack years: 42.00    Types: Cigarettes    Last attempt to quit: 09/25/2016    Years since quitting: 1.1  . Smokeless tobacco: Former Systems developer    Quit date: 06/13/2016  Substance and Sexual Activity  . Alcohol use: No    Alcohol/week: 0.0 standard drinks  . Drug use: No  . Sexual activity: Not on file  Lifestyle  . Physical activity:    Days per week: Not on file    Minutes per session: Not on file  . Stress: Not on file  Relationships  . Social connections:    Talks on phone: Not on file    Gets together: Not on file    Attends  religious service: Not on file    Active member of club or organization: Not on file    Attends meetings of clubs or organizations: Not on file    Relationship status: Not on file  Other Topics Concern  . Not on file  Social History Narrative  . Not on file    Hospital Course:  Patient is seen and examined.  Patient is a 57 year old female with a long-standing past psychiatric history significant for major depression; recurrent, severe without psychotic features who presented to the [facility] emergency department with suicidal ideation after an intentional overdose.  The patient was very nonchalant about the overdose.  She discussed the fact that she had placed all of her financial affairs in order for her children and grandchildren.  She had taken an overdose of medications that she had at home, and was on her way to go to the drugstore to purchase more over-the-counter medication to overdose with.  Her daughter intercepted her.  This is been planned for some time.  She stated she had taken multiple BuSpar and trazodone pills.  Her daughter took her to the emergency room.  She has been followed by a psychiatrist at [facility], and most recently discussed transcranial magnetic stimulation for treatment.  She has had ECT in the past both at [facility] as well as [facility].  Her last psychiatric hospitalization was 07/18/2017.  After the hospitalization she continued in the intensive outpatient program at the behavioral health hospital.  She is severely depressed, helpless, hopeless and worthless.  She remains suicidal.  We discussed the possibility of transfer to [facility], [facility] or [facility] for ECT treatment.  She is in agreement with that.  She was admitted to the hospital for evaluation and stabilization.  Crystal Duarte was started on medication regimen for presenting symptoms. She was medicated &  discharged on; medications as noted below. Patient has been adherent with treatment recommendations and tolerated the medications although she continued to endorsed ongoing depressive symptom which showed no improvement. She continued to have helplessness, hopelessness and worthlessness.  She denied any gross suicidal ideation. It was agreed that because patient had a long history of chronic depression with treatment and no improvement that she would benefit from ECT. She will be transferred to [facility] for ECT today and start ECT tomorrow. Patient has not voiced any suicidal thoughts. Patient will resume medications as noted below. Patient left [facility] with all personal belongings in no apparent distress. Transportation per patient/ family arrangement.    Labs: Reviewed and noted as below. TSH normal. UDS and pregnancy negative. Ethanol negative.    Physical Findings: AIMS:  , ,  ,  ,  CIWA:    COWS:     Musculoskeletal: Strength & Muscle Tone: within normal limits Gait & Station: normal Patient leans: N/A  Psychiatric Specialty Exam: SEE SRA BY MD  Physical Exam  Nursing note and vitals reviewed. Constitutional: She is oriented to person, place, and time.  Neurological: She is alert and oriented to person, place, and time.    Review of Systems  Psychiatric/Behavioral: Positive for depression. Negative for hallucinations, memory loss, substance abuse and suicidal ideas. The patient is nervous/anxious. The patient does not have insomnia.   All other systems reviewed and are negative.   Blood pressure 109/78, pulse 74, temperature 98.1 F (36.7 C), temperature source Oral, resp. rate 16, height 5\' 4"  (1.626 m), weight 76.7 kg, SpO2 97 %.Body mass index is 29.01 kg/m.      Has this patient used any form of tobacco in the last 30 days? (Cigarettes, Smokeless Tobacco, Cigars, and/or Pipes)  N/A  Blood Alcohol level:  Lab Results  Component Value Date   ETH <10 11/02/2017   ETH <10  62/22/9798    Metabolic Disorder Labs:  Lab Results  Component Value Date   HGBA1C 5.4 07/21/2017   MPG 108.28 07/21/2017   MPG 114 08/28/2012   No results found for: PROLACTIN Lab Results  Component Value Date   CHOL 200 07/21/2017   TRIG 208 (H) 07/21/2017   HDL 54 07/21/2017   CHOLHDL 3.7 07/21/2017   VLDL 42 (H) 07/21/2017   LDLCALC 104 (H) 07/21/2017   LDLCALC 119 (H) 10/08/2015    See Psychiatric Specialty Exam and Suicide Risk Assessment completed by Attending Physician prior to discharge.  Discharge destination:  Other:  [facility] for ECT  Is patient on multiple antipsychotic therapies at discharge:  No   Has Patient had three or more failed trials of antipsychotic monotherapy by history:  No  Recommended Plan for Multiple Antipsychotic Therapies: NA   Allergies as of 11/16/2017      Reactions   Latuda [lurasidone Hcl] Anxiety   Sulfa Antibiotics Rash   Only in sunlight       Medication List    STOP taking these medications   acetaminophen 500 MG tablet Commonly known as:  TYLENOL   albuterol 108 (90 Base) MCG/ACT inhaler Commonly known as:  PROVENTIL HFA;VENTOLIN HFA   ALEVE PM PO   DULoxetine 60 MG capsule Commonly known as:  CYMBALTA   fluticasone 50 MCG/ACT nasal spray Commonly known as:  FLONASE   naproxen 500 MG tablet Commonly known as:  NAPROSYN   orphenadrine 100 MG tablet Commonly known as:  NORFLEX   oxyCODONE-acetaminophen 5-325 MG tablet Commonly known as:  PERCOCET/ROXICET   QUEtiapine 300 MG tablet Commonly known as:  SEROQUEL   traMADol 50 MG tablet Commonly known as:  ULTRAM     TAKE these medications     Indication  acidophilus Caps capsule Take 1 capsule by mouth daily. Start taking on:  11/17/2017  Indication:  probiotic   busPIRone 15 MG tablet Commonly known as:  BUSPAR Take 1 tablet (15 mg total) by mouth 3 (three) times daily.  Indication:  Anxiety Disorder   capsaicin 0.025 %  cream Commonly known as:  ZOSTRIX Apply topically 2 (two) times daily. Affected area  Indication:  Pain   diclofenac sodium 1 % Gel Commonly known as:  VOLTAREN Apply 2 g topically 4 (four) times daily.  Indication:  pain   gabapentin 400 MG capsule Commonly known as:  NEURONTIN Take  1 capsule (400 mg total) by mouth 3 (three) times daily.  Indication:  Social Anxiety Disorder, anxiety   hydrOXYzine 25 MG tablet Commonly known as:  ATARAX/VISTARIL Take 1 tablet (25 mg total) by mouth 3 (three) times daily as needed for anxiety.  Indication:  Feeling Anxious   liothyronine 25 MCG tablet Commonly known as:  CYTOMEL Take 0.5 tablets (12.5 mcg total) by mouth daily. Start taking on:  11/17/2017  Indication:  Depression   nabumetone 500 MG tablet Commonly known as:  RELAFEN Take 1 tablet (500 mg total) by mouth 2 (two) times daily.  Indication:  arthritc pain   propranolol 20 MG tablet Commonly known as:  INDERAL Take 1 tablet (20 mg total) by mouth 3 (three) times daily.  Indication:  anxiety   traZODone 100 MG tablet Commonly known as:  DESYREL Take 2 tablets (200 mg total) by mouth at bedtime as needed for sleep. What changed:    how much to take  when to take this  Indication:  Trouble Sleeping   vortioxetine HBr 5 MG Tabs tablet Commonly known as:  TRINTELLIX Take 3 tablets (15 mg total) by mouth daily. Start taking on:  11/17/2017  Indication:  Major Depressive Disorder      Follow-up Information    [facility]. Go on 11/16/2017.   Why:  You will be transferred to [facility] today to begin ECT treatment. Contact information: [place] [phone] ar [place] [phone]          Follow-up recommendations:Follow up with your outpatient provided for any medical issues. Activity & diet as recommended by your primary care provider.  Comments:   Patient is instructed prior to discharge to:  Take all medications as prescribed by his/her mental healthcare provider. Report any adverse effects and or reactions from the medicines to his/her outpatient provider promptly. Patient has been instructed & cautioned: To not engage in alcohol and or illegal drug use while on prescription medicines. In the event of worsening symptoms, patient is instructed to call the crisis hotline, 911 and or go to the nearest ED for appropriate evaluation and treatment of symptoms. To follow-up with his/her primary care provider for your other medical issues, concerns and or health care needs.  Signed: Mordecai Maes, NP 11/16/2017, 11:53 AM

## 2017-11-16 NOTE — Progress Notes (Addendum)
OT Cancellation Note  Patient Details Name: Crystal Duarte MRN: [id number] DOB: 11/20/1960   Cancelled Treatment:     Met with pt for brief follow up regarding results of X-ray results from prior date. Findings show that no interval healing of L ulnar styloid fx has occurred, causing significant pain (original injury was radial fracture in May, which has healed, noted ulnar fx on 7/12). Symptoms of CRPS suspected. Per ulnar styloid protocol at 3-4 week mark, educated pt to wear wrist immobilization intermittently (at night or during more vigorous activity) for pain control (considering no union of fracture). Also encouraged continued AROM/PROM exercises to hand/thumb to maintain joint integrity to engage in BADL. Deferred wrist mobilization at this time. Pt encouraged to continue these exercises until seen at next follow up therapy.  Pt being discharged to [facility] for ECT, OTconsult appreciated to continue care of L ulnar styloid fracture.   [name], MSOT, OTR/L   [place] 11/16/2017, 1:19 PM

## 2017-11-16 NOTE — Progress Notes (Signed)
Report called to [facility]. Vitals and Labs WDL. Pt transferred from unit to [facility] after AVS, transition report, prescriptions, and suicide risk assessment were reviewed. Pt confirmed information with teach back. Pt denies SI/HI/AVH and contracts to seek support if thoughts as such occur. Patient was offered support and encouragement. All belongings returned to patient. Pts questions were answered and was escorted safely to transportation.

## 2017-11-16 NOTE — BHH Suicide Risk Assessment (Signed)
[facility] Discharge Suicide Risk Assessment   Principal Problem: <principal problem not specified> Discharge Diagnoses:  Patient Active Problem List   Diagnosis Date Noted  . Severe episode of recurrent major depressive disorder, without psychotic features ([place]) [F33.2] 07/18/2017  . DOE (dyspnea on exertion) [R06.09] 12/26/2016  . COPD GOLD II  [J44.9] 12/26/2016  . Orthostatic hypotension [I95.1] 12/14/2016  . Chest tightness [R07.89] 12/14/2016  . Weight loss [R63.4] 12/14/2016  . Routine general medical examination at a health care facility [Z00.00] 10/09/2015  . 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 Hx of adenomatous polyp of colon [Z86.010] 12/04/2014  . Memory loss [R41.3] 10/04/2014  . Affective bipolar disorder ([place]) [F31.9] 03/12/2014  . Peripheral vascular disease, unspecified ([place]) [I73.9] 10/10/2013  . Smoker [F17.200] 08/28/2012  . Menopause [Z78.0] 08/28/2012  . Multiple pulmonary nodules [R91.8] 11/12/2010    Total Time spent with patient: 20 minutes  Musculoskeletal: Strength & Muscle Tone: within normal limits Gait & Station: normal Patient leans: N/A  Psychiatric Specialty Exam: Review of Systems  All other systems reviewed and are negative.   Blood pressure 109/78, pulse 74, temperature 98.1 F (36.7 C), temperature source Oral, resp. rate 16, height 5\' 4"  (1.626 m), weight 76.7 kg, SpO2 97 %.Body mass index is 29.01 kg/m.  General Appearance: Casual  Eye Contact::  Fair  Speech:  Normal Rate409  Volume:  Normal  Mood:  Depressed  Affect:  Congruent  Thought Process:  Coherent  Orientation:  Full (Time, Place, and Person)  Thought Content:  Logical  Suicidal Thoughts:  Yes.  without intent/plan  Homicidal Thoughts:  No  Memory:  Immediate;   Fair Recent;   Fair Remote;   Fair  Judgement:  Intact  Insight:  Fair  Psychomotor Activity:  Normal  Concentration:  Fair  Recall:  [employer] [place]  Language: Fair  Akathisia:   Negative  Handed:  Right  AIMS (if indicated):     Assets:  Communication Skills Desire for Improvement Financial Resources/Insurance Housing Physical Health Resilience Social Support  Sleep:  Number of Hours: 6.5  Cognition: WNL  ADL's:  Intact   Mental Status Per Nursing Assessment::   On Admission:  Suicidal ideation indicated by patient  Demographic Factors:  Divorced or widowed, Caucasian and Living alone  Loss Factors: NA  Historical Factors: Prior suicide attempts and Impulsivity  Risk Reduction Factors:   Positive social support and Positive therapeutic relationship  Continued Clinical Symptoms:  Depression:   Anhedonia Hopelessness Impulsivity Insomnia  Cognitive Features That Contribute To Risk:  None    Suicide Risk:  Moderate:  Frequent suicidal ideation with limited intensity, and duration, some specificity in terms of plans, no associated intent, good self-control, limited dysphoria/symptomatology, some risk factors present, and identifiable protective factors, including available and accessible social support.  Follow-up Information    [facility]. Go on 11/16/2017.   Why:  You will be transferred to [facility] today to begin ECT treatment. Contact information: [place] [phone] ar [place] [phone]          Plan Of Care/Follow-up recommendations:  Activity:  ad lib  Sharma Covert, MD 11/16/2017, 10:58 AM

## 2017-11-16 NOTE — Progress Notes (Signed)
CSW spoke to Dr Weber Cooks at [facility] regarding ECT transfer.  He agreed to accept pt today with plan to start ECT tomorrow.  CSW spoke to [place] in [facility] who will obtain bed assignment.  CSW spoke to pt son Elta [place] and informed him as well. Winferd Humphrey, MSW, LCSW Clinical Social Worker 11/16/2017 10:59 AM

## 2017-11-16 NOTE — BHH Group Notes (Signed)
[place] LCSW Group Therapy Note  Date/Time: 11/16/17, 1315  Type of Therapy/Topic:  Group Therapy:  Balance in Life  Participation Level:  minimal  Description of Group:    This group will address the concept of balance and how it feels and looks when one is unbalanced. Patients will be encouraged to process areas in their lives that are out of balance, and identify reasons for remaining unbalanced. Facilitators will guide patients utilizing problem- solving interventions to address and correct the stressor making their life unbalanced. Understanding and applying boundaries will be explored and addressed for obtaining  and maintaining a balanced life. Patients will be encouraged to explore ways to assertively make their unbalanced needs known to significant others in their lives, using other group members and facilitator for support and feedback.  Therapeutic Goals: 1. Patient will identify two or more emotions or situations they have that consume much of in their lives. 2. Patient will identify signs/triggers that life has become out of balance:  3. Patient will identify two ways to set boundaries in order to achieve balance in their lives:  4. Patient will demonstrate ability to communicate their needs through discussion and/or role plays  Summary of Patient Progress:Pt shared that mental/emotional and financial are areas of her life that are out of balance.  Pt was attentive throughout group discussion and made several contributions.           Therapeutic Modalities:   Cognitive Behavioral Therapy Solution-Focused Therapy Assertiveness Training  Lurline Idol, [place]

## 2017-11-16 NOTE — Progress Notes (Signed)
A new voluntary consent for tx form reviewed with pt and signed. Consent form faxed to [place] unit at [phone].

## 2017-11-16 NOTE — Tx Team (Signed)
Initial Treatment Plan 11/16/2017 11:04 PM Alesia Morin Babe ZYS:[id number]    PATIENT STRESSORS: Medication change or noncompliance Traumatic event Other: AntiDepressant Resistant now to Explore ECT   PATIENT STRENGTHS: Ability for insight Average or above average intelligence Capable of independent living Motivation for treatment/growth Supportive family/friends   PATIENT IDENTIFIED PROBLEMS: Mood Instability  Suicidal Thoughts  Ineffective Coping Skills                 DISCHARGE CRITERIA:  Improved stabilization in mood, thinking, and/or behavior Motivation to continue treatment in a less acute level of care Need for constant or close observation no longer present Safe-care adequate arrangements made Verbal commitment to aftercare and medication compliance  PRELIMINARY DISCHARGE PLAN: Outpatient therapy Return to previous living arrangement  PATIENT/FAMILY INVOLVEMENT: This treatment plan has been presented to and reviewed with the patient, Maddisyn Hegwood.  The patient have been given the opportunity to ask questions and make suggestions.  Electa Sniff, RN 11/16/2017, 11:04 PM

## 2017-11-16 NOTE — BH Assessment (Signed)
Patient has been accepted to [facility].  Accepting physician is Dr. Weber Cooks.  Attending Physician will be Dr. Weber Cooks.  Patient has been assigned to room 325, by [place].  Call report to [phone].  Representative/Transfer Coordinator is Dispensing optician Patient pre-admitted by [facility] Patient Access Levada Dy)  [facility] staff Leone Brand., TTS) made aware of acceptance.

## 2017-11-17 DIAGNOSIS — F332 Major depressive disorder, recurrent severe without psychotic features: Principal | ICD-10-CM

## 2017-11-17 DIAGNOSIS — R45851 Suicidal ideations: Secondary | ICD-10-CM

## 2017-11-17 NOTE — H&P (Signed)
Psychiatric Admission Assessment Adult  Patient Identification: Crystal Duarte MRN:  [id number] Date of Evaluation:  11/17/2017 Chief Complaint:  Severe episode of recurrent major depressive disorder, without psychotic features Principal Diagnosis: Severe recurrent major depression without psychotic features ([place]) Diagnosis:   Patient Active Problem List   Diagnosis Date Noted  . Suicidal ideation [R45.851] 11/17/2017  . Severe recurrent major depression without psychotic features ([place]) [F33.2] 11/16/2017  . Severe episode of recurrent major depressive disorder, without psychotic features ([place]) [F33.2] 07/18/2017  . DOE (dyspnea on exertion) [R06.09] 12/26/2016  . COPD GOLD II  [J44.9] 12/26/2016  . Orthostatic hypotension [I95.1] 12/14/2016  . Chest tightness [R07.89] 12/14/2016  . Weight loss [R63.4] 12/14/2016  . Routine general medical examination at a health care facility [Z00.00] 10/09/2015  . 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 Hx of adenomatous polyp of colon [Z86.010] 12/04/2014  . Memory loss [R41.3] 10/04/2014  . Affective bipolar disorder ([place]) [F31.9] 03/12/2014  . Peripheral vascular disease, unspecified ([place]) [I73.9] 10/10/2013  . Smoker [F17.200] 08/28/2012  . Menopause [Z78.0] 08/28/2012  . Multiple pulmonary nodules [R91.8] 11/12/2010   History of Present Illness: Patient is [facility] [place] [facility].  She was admitted there on 6 August after a serious suicide attempt by overdose on multiple medications.  Patient describes a depression that is been going on with increasingly and tense symptoms for the past month or so despite her continued treatment with antidepressant medicine and therapy.  Patient still is feeling down and sad and depressed.  Energy level low.  Feels hopeless much of the time.  She is not having psychotic symptoms and her judgment and insight are good.  Patient has a history of good response to ECT in the past and  has been transferred to [place] with the intention of beginning ECT treatment and possibly having continued maintenance in the future.  Major stresses include being overwhelmed and her social life at home. Associated Signs/Symptoms: Depression Symptoms:  depressed mood, psychomotor retardation, hopelessness, suicidal attempt, (Hypo) Manic Symptoms:  None Anxiety Symptoms:  Excessive Worry, Psychotic Symptoms:  None PTSD Symptoms: Negative Total Time spent with patient: 1 hour  Past Psychiatric History: Patient has a long history of mood symptoms with multiple episodes of depression and positive prior suicide attempts.  She has had ECT on several occasions most recently within the last year in [place].  She responds well to the treatment although she frequently has cognitive side effects.  Patient is strongly convinced that she does not have bipolar disorder.  There is no history of true mania and she says she has had very bad reactions to all mood stabilizers she has tried.  Is the patient at risk to self? Yes.    Has the patient been a risk to self in the past 6 months? Yes.    Has the patient been a risk to self within the distant past? Yes.    Is the patient a risk to others? No.  Has the patient been a risk to others in the past 6 months? No.  Has the patient been a risk to others within the distant past? No.   Prior Inpatient Therapy:   Prior Outpatient Therapy:    Alcohol Screening: 1. How often do you have a drink containing alcohol?: Never 2. How many drinks containing alcohol do you have on a typical day when you are drinking?: 1 or 2 3. How often do you have six or more drinks on one occasion?:  Never AUDIT-C Score: 0 4. How often during the last year have you found that you were not able to stop drinking once you had started?: Never 5. How often during the last year have you failed to do what was normally expected from you becasue of drinking?: Never 6. How often during the  last year have you needed a first drink in the morning to get yourself going after a heavy drinking session?: Never 7. How often during the last year have you had a feeling of guilt of remorse after drinking?: Never 8. How often during the last year have you been unable to remember what happened the night before because you had been drinking?: Never 9. Have you or someone else been injured as a result of your drinking?: No 10. Has a relative or friend or a doctor or another health worker been concerned about your drinking or suggested you cut down?: No Alcohol Use Disorder Identification Test Final Score (AUDIT): 0 Intervention/Follow-up: AUDIT Score <7 follow-up not indicated Substance Abuse History in the last 12 months:  No. Consequences of Substance Abuse: Negative Previous Psychotropic Medications: Yes  Psychological Evaluations: Yes  Past Medical History:  Past Medical History:  Diagnosis Date  . Coccidioidomycosis, pulmonary ([place])   . Depression   . Hx of adenomatous polyp of colon 12/04/2014  . Osteopenia 11/2016   T score -2.2 FRAX 17%/2.4%  . Panic attack     Past Surgical History:  Procedure Laterality Date  . ABDOMINAL HYSTERECTOMY    . COLONOSCOPY  2001   hemorrhoidectomy  . LUNG SURGERY  2001   VATS surgery   . OVARIAN CYST REMOVAL  1970's  . VESICOVAGINAL FISTULA CLOSURE W/ TAH  2005   Family History:  Family History  Problem Relation Age of Onset  . Asthma Mother   . Ovarian cancer Mother   . Cancer Mother        OVARIAN  . Varicose Veins Mother   . Heart disease Father   . Peripheral vascular disease Father   . Cancer Maternal Grandmother        LUNG- LUNG  . Colon cancer Neg Hx    Family Psychiatric  History: Positive for depression and some family members Tobacco Screening: Have you used any form of tobacco in the last 30 days? (Cigarettes, Smokeless Tobacco, Cigars, and/or Pipes): Yes Tobacco use, Select all that apply: 5 or more cigarettes per  day Are you interested in Tobacco Cessation Medications?: Yes, will notify MD for an order Counseled patient on smoking cessation including recognizing danger situations, developing coping skills and basic information about quitting provided: Yes Social History:  Social History   Substance and Sexual Activity  Alcohol Use No  . Alcohol/week: 0.0 standard drinks     Social History   Substance and Sexual Activity  Drug Use No    Additional Social History:                           Allergies:   Allergies  Allergen Reactions  . Latuda [Lurasidone Hcl] Anxiety  . Sulfa Antibiotics Rash    Only in sunlight    Lab Results: No results found for this or any previous visit (from the past 48 hour(s)).  Blood Alcohol level:  Lab Results  Component Value Date   ETH <10 11/02/2017   ETH <10 31/51/7616    Metabolic Disorder Labs:  Lab Results  Component Value Date   HGBA1C 5.4  07/21/2017   MPG 108.28 07/21/2017   MPG 114 08/28/2012   No results found for: PROLACTIN Lab Results  Component Value Date   CHOL 200 07/21/2017   TRIG 208 (H) 07/21/2017   HDL 54 07/21/2017   CHOLHDL 3.7 07/21/2017   VLDL 42 (H) 07/21/2017   LDLCALC 104 (H) 07/21/2017   LDLCALC 119 (H) 10/08/2015    Current Medications: Current Facility-Administered Medications  Medication Dose Route Frequency Provider Last Rate Last Dose  . acetaminophen (TYLENOL) tablet 650 mg  650 mg Oral Q6H PRN Clapacs, John T, MD      . acidophilus (RISAQUAD) capsule 1 capsule  1 capsule Oral Daily Clapacs, Madie Reno, MD   1 capsule at 11/17/17 0929  . alum & mag hydroxide-simeth (MAALOX/MYLANTA) 200-200-20 MG/5ML suspension 30 mL  30 mL Oral Q4H PRN Clapacs, John T, MD      . busPIRone (BUSPAR) tablet 15 mg  15 mg Oral TID Clapacs, Madie Reno, MD   15 mg at 11/17/17 1733  . capsaicin (ZOSTRIX) 0.025 % cream   Topical BID Clapacs, John T, MD      . diclofenac sodium (VOLTAREN) 1 % transdermal gel 2 g  2 g Topical QID  Clapacs, John T, MD      . gabapentin (NEURONTIN) capsule 400 mg  400 mg Oral TID Clapacs, John T, MD   400 mg at 11/17/17 1733  . hydrOXYzine (ATARAX/VISTARIL) tablet 25 mg  25 mg Oral TID PRN Clapacs, Madie Reno, MD   25 mg at 11/16/17 2128  . liothyronine (CYTOMEL) tablet 12.5 mcg  12.5 mcg Oral Daily Clapacs, Madie Reno, MD   12.5 mcg at 11/17/17 0929  . magnesium hydroxide (MILK OF MAGNESIA) suspension 30 mL  30 mL Oral Daily PRN Clapacs, John T, MD      . nabumetone (RELAFEN) tablet 500 mg  500 mg Oral BID Clapacs, Madie Reno, MD   500 mg at 11/17/17 1733  . propranolol (INDERAL) tablet 20 mg  20 mg Oral TID Clapacs, Madie Reno, MD   20 mg at 11/17/17 1733  . traZODone (DESYREL) tablet 200 mg  200 mg Oral QHS PRN Clapacs, Madie Reno, MD   200 mg at 11/16/17 2128  . vortioxetine HBr (TRINTELLIX) tablet 15 mg  15 mg Oral Daily Clapacs, John T, MD   15 mg at 11/17/17 0932   PTA Medications: Medications Prior to Admission  Medication Sig Dispense Refill Last Dose  . acidophilus (RISAQUAD) CAPS capsule Take 1 capsule by mouth daily. 1 capsule 0   . busPIRone (BUSPAR) 15 MG tablet Take 1 tablet (15 mg total) by mouth 3 (three) times daily. 1 tablet 0   . capsaicin (ZOSTRIX) 0.025 % cream Apply topically 2 (two) times daily. Affected area 60 g 0   . diclofenac sodium (VOLTAREN) 1 % GEL Apply 2 g topically 4 (four) times daily. 1 Tube 0   . gabapentin (NEURONTIN) 400 MG capsule Take 1 capsule (400 mg total) by mouth 3 (three) times daily. 1 capsule 0   . hydrOXYzine (ATARAX/VISTARIL) 25 MG tablet Take 1 tablet (25 mg total) by mouth 3 (three) times daily as needed for anxiety. 1 tablet 0   . liothyronine (CYTOMEL) 25 MCG tablet Take 0.5 tablets (12.5 mcg total) by mouth daily. 1 tablet 0   . nabumetone (RELAFEN) 500 MG tablet Take 1 tablet (500 mg total) by mouth 2 (two) times daily. 1 tablet 0   . propranolol (INDERAL) 20 MG tablet Take  1 tablet (20 mg total) by mouth 3 (three) times daily. 1 tablet 0   . traZODone  (DESYREL) 100 MG tablet Take 2 tablets (200 mg total) by mouth at bedtime as needed for sleep. 1 tablet 0   . vortioxetine HBr (TRINTELLIX) 5 MG TABS tablet Take 3 tablets (15 mg total) by mouth daily. 1 tablet 0     Musculoskeletal: Strength & Muscle Tone: within normal limits Gait & Station: normal Patient leans: N/A  Psychiatric Specialty Exam: Physical Exam  Nursing note and vitals reviewed. Constitutional: She appears well-developed and well-nourished.  HENT:  Head: Normocephalic and atraumatic.  Eyes: Pupils are equal, round, and reactive to light. Conjunctivae are normal.  Neck: Normal range of motion.  Cardiovascular: Regular rhythm and normal heart sounds.  Respiratory: Effort normal. No respiratory distress.  GI: Soft.  Musculoskeletal: Normal range of motion.  Neurological: She is alert.  Skin: Skin is warm and dry.  Psychiatric: Judgment normal. Her speech is delayed. She is slowed. Cognition and memory are normal. She exhibits a depressed mood. She expresses suicidal ideation. She expresses no suicidal plans.    Review of Systems  Constitutional: Negative.   HENT: Negative.   Eyes: Negative.   Respiratory: Negative.   Cardiovascular: Negative.   Gastrointestinal: Negative.   Musculoskeletal: Negative.   Skin: Negative.   Neurological: Negative.   Psychiatric/Behavioral: Positive for depression and suicidal ideas. Negative for hallucinations, memory loss and substance abuse. The patient is nervous/anxious. The patient does not have insomnia.     Blood pressure 133/88, pulse 66, temperature 98.7 F (37.1 C), temperature source Oral, resp. rate 18, height 5\' 5"  (1.651 m), weight 85.3 kg, SpO2 100 %.Body mass index is 31.28 kg/m.  General Appearance: Casual  Eye Contact:  Good  Speech:  Clear and Coherent  Volume:  Normal  Mood:  Depressed and Dysphoric  Affect:  Congruent  Thought Process:  Goal Directed  Orientation:  Full (Time, Place, and Person)  Thought  Content:  Logical  Suicidal Thoughts:  Yes.  without intent/plan  Homicidal Thoughts:  No  Memory:  Immediate;   Fair Recent;   Fair Remote;   Fair  Judgement:  Fair  Insight:  Fair  Psychomotor Activity:  Decreased  Concentration:  Concentration: Fair  Recall:  [employer] of Knowledge:  Fair  Language:  Fair  Akathisia:  No  Handed:  Right  AIMS (if indicated):     Assets:  Desire for Improvement Housing Physical Health Resilience Social Support  ADL's:  Intact  Cognition:  WNL  Sleep:  Number of Hours: 6.75    Treatment Plan Summary: Daily contact with patient to assess and evaluate symptoms and progress in treatment, Medication management and Plan Patient had been maintained on high dose Cymbalta for years but feels it has not been very helpful although she is not clear that any medicines in the past it really made much of a difference.  She has confidence in ECT and is agreeable to the treatment.  We discussed risks and benefits and possible complications.  Patient is agreeable to starting right unilateral ECT beginning Monday morning.  Nursing has been notified and will put the patient on the schedule.  I will review all her labs and make sure we have a complete workup.  Her son has requested that I give him a call as well and I will follow-up with that.  Continue current medication plan and start ECT Monday.  Observation Level/Precautions:  15 minute  checks  Laboratory:  Chemistry Profile  Psychotherapy:    Medications:    Consultations:    Discharge Concerns:    Estimated LOS:  Other:     Physician Treatment Plan for Primary Diagnosis: Severe recurrent major depression without psychotic features ([place]) Long Term Goal(s): Improvement in symptoms so as ready for discharge  Short Term Goals: Ability to verbalize feelings will improve and Ability to disclose and discuss suicidal ideas  Physician Treatment Plan for Secondary Diagnosis: Principal Problem:   Severe  recurrent major depression without psychotic features ([place]) Active Problems:   Severe episode of recurrent major depressive disorder, without psychotic features ([place])   Suicidal ideation  Long Term Goal(s): Improvement in symptoms so as ready for discharge  Short Term Goals: Ability to maintain clinical measurements within normal limits will improve and Compliance with prescribed medications will improve  I certify that inpatient services furnished can reasonably be expected to improve the patient's condition.    Alethia Berthold, MD 8/16/20196:21 PM

## 2017-11-17 NOTE — Progress Notes (Signed)
Recreation Therapy Notes  Date: 11/17/2017  Time: 9:30 am   Location: Craft Room   Behavioral response: N/A   Intervention Topic: Leisure Skills  Discussion/Intervention: Patient did not attend group.   Clinical Observations/Feedback:  Patient did not attend group.   Crystal Duarte LRT/CTRS         Crystal Duarte 11/17/2017 12:26 PM
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
## 2017-11-17 NOTE — Progress Notes (Signed)
Recreation Therapy Notes  INPATIENT RECREATION THERAPY ASSESSMENT  Patient Details Name: Ovida Delagarza MRN: [id number] DOB: 1960/08/29 Today's Date: 11/17/2017       Information Obtained From: (Patient refused assessment)  Able to Participate in Assessment/Interview:    Patient Presentation:    Reason for Admission (Per Patient):    Patient Stressors:    Coping Skills:      Leisure Interests (2+):     Frequency of Recreation/Participation:    Awareness of Community Resources:     [employer]:     Current Use:    If no, Barriers?:    Expressed Interest in [place] of Residence:     Patient Main Form of Transportation:    Patient Strengths:     Patient Identified Areas of Improvement:     Patient Goal for Hospitalization:     Current SI (including self-harm):     Current HI:     Current AVH:    Staff Intervention Plan:    Consent to Intern Participation:    Carmen Tolliver 11/17/2017, 3:05 PM

## 2017-11-17 NOTE — BHH Suicide Risk Assessment (Signed)
[facility] INPATIENT:  Family/Significant Other Suicide Prevention Education  Suicide Prevention Education:  Education Completed; Pt's son, Carleene Mains, at [phone] has been identified by the patient as the family member/significant other with whom the patient will be residing, and identified as the person(s) who will aid the patient in the event of a mental health crisis (suicidal ideations/suicide attempt).  With written consent from the patient, the family member/significant other has been provided the following suicide prevention education, prior to the and/or following the discharge of the patient.  The suicide prevention education provided includes the following:  Suicide risk factors  Suicide prevention and interventions  National Suicide Hotline telephone number  [facility] assessment telephone number  [facility] Emergency Assistance [place] and/or Residential Mobile Crisis Unit telephone number  Request made of family/significant other to:  Remove weapons (e.g., guns, rifles, knives), all items previously/currently identified as safety concern.    Remove drugs/medications (over-the-counter, prescriptions, illicit drugs), all items previously/currently identified as a safety concern.  The family member/significant other verbalizes understanding of the suicide prevention education information provided.  The family member/significant other agrees to remove the items of safety concern listed above.  Pt's son stated, "She was with [place] about 3-4 days prior to her being admitted at [facility]. In the days before her admission, she kind of went dark on us-she does this often. She'll ignore our texts, blow [place] off. She lives with my grandmother (her mother) and that scenario has been very bad for a very long time. I went to check on her, and she tells me she's been abusing her medicines and says she doesn't want to do life anymore. I took all of her medicines  and made her come with me to my house. I was watching her there. She slept for 24-48 hours. I thought maybe she was coming off meds. On Thursday, my mom tried to leave while I was at work and my wife was watching her. She stuck around because my wife told her my sister was coming to see her. She said she was going to park her car and 'get this over with and not wake up.' My sister took her right over to the hospital. Then she was admitted and that's where she's been. It's been a horrible experience so far at [facility]. She's been inpatient many times in the past. There's been no counseling for her at all. She's had ECT with you guys years ago. Also, just to make you guys aware-she has the world's worst veins. So, they had to put a pick in at the last hospital because they couldn't even find her veins. I just wanted you guys to be aware of that." CSW will continue to coordinate with pt's son as needed for updates/discharge planning.   Alden Hipp, LCSW 11/17/2017, 10:08 AM
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
## 2017-11-17 NOTE — Tx Team (Addendum)
Interdisciplinary Treatment and Diagnostic Plan Update  11/17/2017 Time of Session: 0900 Crystal Duarte MRN: [id number]  Principal Diagnosis: <principal problem not specified>  Secondary Diagnoses: Active Problems:   Severe recurrent major depression without psychotic features (HCC)   Current Medications:  Current Facility-Administered Medications  Medication Dose Route Frequency Provider Last Rate Last Dose  . acetaminophen (TYLENOL) tablet 650 mg  650 mg Oral Q6H PRN Clapacs, John T, MD      . acidophilus (RISAQUAD) capsule 1 capsule  1 capsule Oral Daily Clapacs, John T, MD      . alum & mag hydroxide-simeth (MAALOX/MYLANTA) 200-200-20 MG/5ML suspension 30 mL  30 mL Oral Q4H PRN Clapacs, John T, MD      . busPIRone (BUSPAR) tablet 15 mg  15 mg Oral TID Clapacs, John T, MD      . capsaicin (ZOSTRIX) 0.025 % cream   Topical BID Clapacs, John T, MD      . diclofenac sodium (VOLTAREN) 1 % transdermal gel 2 g  2 g Topical QID Clapacs, John T, MD      . gabapentin (NEURONTIN) capsule 400 mg  400 mg Oral TID Clapacs, John T, MD      . hydrOXYzine (ATARAX/VISTARIL) tablet 25 mg  25 mg Oral TID PRN Clapacs, Madie Reno, MD   25 mg at 11/16/17 2128  . liothyronine (CYTOMEL) tablet 12.5 mcg  12.5 mcg Oral Daily Clapacs, John T, MD      . magnesium hydroxide (MILK OF MAGNESIA) suspension 30 mL  30 mL Oral Daily PRN Clapacs, John T, MD      . nabumetone (RELAFEN) tablet 500 mg  500 mg Oral BID Clapacs, John T, MD      . propranolol (INDERAL) tablet 20 mg  20 mg Oral TID Clapacs, John T, MD      . traZODone (DESYREL) tablet 200 mg  200 mg Oral QHS PRN Clapacs, Madie Reno, MD   200 mg at 11/16/17 2128  . vortioxetine HBr (TRINTELLIX) tablet 15 mg  15 mg Oral Daily Clapacs, John T, MD       PTA Medications: Medications Prior to Admission  Medication Sig Dispense Refill Last Dose  . acidophilus (RISAQUAD) CAPS capsule Take 1 capsule by mouth daily. 1 capsule 0   . busPIRone (BUSPAR) 15 MG tablet Take  1 tablet (15 mg total) by mouth 3 (three) times daily. 1 tablet 0   . capsaicin (ZOSTRIX) 0.025 % cream Apply topically 2 (two) times daily. Affected area 60 g 0   . diclofenac sodium (VOLTAREN) 1 % GEL Apply 2 g topically 4 (four) times daily. 1 Tube 0   . gabapentin (NEURONTIN) 400 MG capsule Take 1 capsule (400 mg total) by mouth 3 (three) times daily. 1 capsule 0   . hydrOXYzine (ATARAX/VISTARIL) 25 MG tablet Take 1 tablet (25 mg total) by mouth 3 (three) times daily as needed for anxiety. 1 tablet 0   . liothyronine (CYTOMEL) 25 MCG tablet Take 0.5 tablets (12.5 mcg total) by mouth daily. 1 tablet 0   . nabumetone (RELAFEN) 500 MG tablet Take 1 tablet (500 mg total) by mouth 2 (two) times daily. 1 tablet 0   . propranolol (INDERAL) 20 MG tablet Take 1 tablet (20 mg total) by mouth 3 (three) times daily. 1 tablet 0   . traZODone (DESYREL) 100 MG tablet Take 2 tablets (200 mg total) by mouth at bedtime as needed for sleep. 1 tablet 0   . vortioxetine HBr (TRINTELLIX)  5 MG TABS tablet Take 3 tablets (15 mg total) by mouth daily. 1 tablet 0     Patient Stressors: Medication change or noncompliance Traumatic event Other: AntiDepressant Resistant now to Explore ECT  Patient Strengths: Ability for insight Average or above average intelligence Capable of independent living Motivation for treatment/growth Supportive family/friends  Treatment Modalities: Medication Management, Group therapy, Case management,  1 to 1 session with clinician, Psychoeducation, Recreational therapy.   Physician Treatment Plan for Primary Diagnosis: <principal problem not specified> Long Term Goal(s):     Short Term Goals:    Medication Management: Evaluate patient's response, side effects, and tolerance of medication regimen.  Therapeutic Interventions: 1 to 1 sessions, Unit Group sessions and Medication administration.  Evaluation of Outcomes: Not Progressing  Physician Treatment Plan for Secondary  Diagnosis: Active Problems:   Severe recurrent major depression without psychotic features ([place])  Long Term Goal(s):     Short Term Goals:       Medication Management: Evaluate patient's response, side effects, and tolerance of medication regimen.  Therapeutic Interventions: 1 to 1 sessions, Unit Group sessions and Medication administration.  Evaluation of Outcomes: Not Progressing   RN Treatment Plan for Primary Diagnosis: <principal problem not specified> Long Term Goal(s): Knowledge of disease and therapeutic regimen to maintain health will improve  Short Term Goals: Ability to participate in decision making will improve, Ability to identify and develop effective coping behaviors will improve and Compliance with prescribed medications will improve  Medication Management: RN will administer medications as ordered by provider, will assess and evaluate patient's response and provide education to patient for prescribed medication. RN will report any adverse and/or side effects to prescribing provider.  Therapeutic Interventions: 1 on 1 counseling sessions, Psychoeducation, Medication administration, Evaluate responses to treatment, Monitor vital signs and CBGs as ordered, Perform/monitor CIWA, COWS, AIMS and Fall Risk screenings as ordered, Perform wound care treatments as ordered.  Evaluation of Outcomes: Not Progressing   LCSW Treatment Plan for Primary Diagnosis: <principal problem not specified> Long Term Goal(s): Safe transition to appropriate next level of care at discharge, Engage patient in therapeutic group addressing interpersonal concerns.  Short Term Goals: Engage patient in aftercare planning with referrals and resources, Increase ability to appropriately verbalize feelings and Increase skills for wellness and recovery  Therapeutic Interventions: Assess for all discharge needs, 1 to 1 time with Social worker, Explore available resources and support systems, Assess for  adequacy in community support network, Educate family and significant other(s) on suicide prevention, Complete Psychosocial Assessment, Interpersonal group therapy.  Evaluation of Outcomes: Not Progressing   Progress in Treatment: Attending groups: No. Participating in groups: No. Taking medication as prescribed: Yes. Toleration medication: Yes. Family/Significant other contact made: No, will contact:  a support if pt provides consent. Patient understands diagnosis: Yes. Discussing patient identified problems/goals with staff: Yes. Medical problems stabilized or resolved: Yes. Denies suicidal/homicidal ideation: Yes. Issues/concerns per patient self-inventory: No. Other: None at this time.   New problem(s) identified: No, Describe:  None at this time.  New Short Term/Long Term Goal(s): Pt will demonstrate an improved mood by at least 48 hours prior to discharge.   Patient Goals:  Pt reported her goal for treatment is, "to get ECT."   Discharge Plan or Barriers: Pt will continue ECT in the outpatient setting upon discharge.   Reason for Continuation of Hospitalization: Depression Medication stabilization  Estimated Length of Stay: 7 days  Recreational Therapy: Patient Stressors: N/A Patient Goal: Patient will engage in groups without  prompting or encouragement from LRT x3 group sessions within 5 recreation therapy group sessions  Attendees: Patient: 11/17/2017 9:01 AM  Physician: Dr. Weber Cooks 11/17/2017 9:01 AM  Nursing:  11/17/2017 9:01 AM  RN Care Manager: 11/17/2017 9:01 AM  Social Worker: Alden Hipp, LCSW 11/17/2017 9:01 AM  Recreational Therapist:  11/17/2017 9:01 AM  Other:  11/17/2017 9:01 AM  Other:  11/17/2017 9:01 AM  Other: 11/17/2017 9:01 AM    Scribe for Treatment Team: Alden Hipp, LCSW 11/17/2017 9:01 AM

## 2017-11-17 NOTE — [facility] Group Notes (Signed)
  11/17/2017  Time: 1PM  Type of Therapy and Topic:  Group Therapy:  Feelings around Relapse and Recovery  Participation Level:  Did Not Attend   Description of Group:    Patients in this group will discuss emotions they experience before and after a relapse. They will process how experiencing these feelings, or avoidance of experiencing them, relates to having a relapse. Facilitator will guide patients to explore emotions they have related to recovery. Patients will be encouraged to process which emotions are more powerful. They will be guided to discuss the emotional reaction significant others in their lives may have to their relapse or recovery. Patients will be assisted in exploring ways to respond to the emotions of others without this contributing to a relapse.  Therapeutic Goals: 1. Patient will identify two or more emotions that lead to a relapse for them 2. Patient will identify two emotions that result when they relapse 3. Patient will identify two emotions related to recovery 4. Patient will demonstrate ability to communicate their needs through discussion and/or role plays   Summary of Patient Progress: Pt was invited to attend group but chose not to attend. CSW will continue to encourage pt to attend group throughout their admission.    Therapeutic Modalities:   Cognitive Behavioral Therapy Solution-Focused Therapy Assertiveness Training Relapse Prevention Therapy  Alden Hipp, MSW, LCSW Clinical Social Worker 11/17/2017 1:40 PM

## 2017-11-17 NOTE — Progress Notes (Signed)
Patient denies SI/HI/AVH. Patient is compliant with medications. Patient has no complaints. Patient is seen in milieu during meal times and interacts with peers. Patient has frequent complaints of being cold and requests extra blankets and for rooms climate to be increased.

## 2017-11-18 LAB — LIPID PANEL
Cholesterol: 232 mg/dL — ABNORMAL HIGH (ref 0–200)
HDL: 49 mg/dL (ref >40)
LDL Cholesterol: 137 mg/dL — ABNORMAL HIGH (ref 0–99)
Total CHOL/HDL Ratio: 4.7 RATIO
Triglycerides: 230 mg/dL — ABNORMAL HIGH (ref <150)
VLDL: 46 mg/dL — ABNORMAL HIGH (ref 0–40)

## 2017-11-18 LAB — TSH: TSH: 0.583 u[IU]/mL (ref 0.350–4.500)

## 2017-11-18 LAB — HEMOGLOBIN A1C
Hgb A1c MFr Bld: 5.7 % — ABNORMAL HIGH (ref 4.8–5.6)
Mean Plasma Glucose: 116.89 mg/dL

## 2017-11-18 NOTE — Plan of Care (Signed)
  Problem: Education: Goal: Knowledge of the prescribed therapeutic regimen will improve Outcome: Progressing  Patient complaint with medication regimen

## 2017-11-18 NOTE — Progress Notes (Signed)
D: Patient alert and oriented x 4, affect is flat but brightens upon approach, thoughts are organized and coherent denies SI/HI/AVH. Patient is pleasant and cooperative, with treatment plan, she stated " l believe l would feel better from doing ECT" she appears less anxious and she is interacting with peers and staff appropriately.  A: Patient was offered support and encouragement, and was given scheduled medications. Pt was encouraged to attend groups.15 minute checks were done for safety.  R:Pt attends groups and interacts appropriately with peers and staff. Pt is complaint with medication.Safety maintained on unit eill continue to monitor.

## 2017-11-18 NOTE — Plan of Care (Signed)
Data: Patient is appropriate and cooperative to assessment. Patient denies SI/HI/AVH. Patient has completed daily self inventory worksheet. Patient reports depression and anxiety today. Patient has a pain rating of 0/10. Patient reports good sleep quality, appetite is good. Patient rates depression "6/10" , feelings of hopelessness "6/10" and anxiety "6/10" Patients goal for today is "go to group."  Action:  Q x 15 minute observation checks were completed for safety. Patient was provided with education on medications. Patient was offered support and encouragement. Patient was given scheduled medications. Patient  was encourage to attend groups, participate in unit activities and continue with plan of care.     Response: Patient is compliant with provided medications. Patient has no complaints at this time. Patient is receptive to treatment and safety maintained on unit.    Problem: Education: Goal: Knowledge of the prescribed therapeutic regimen will improve Outcome: Progressing   Problem: Coping: Goal: Will verbalize feelings Outcome: Progressing   Problem: Health Behavior/Discharge Planning: Goal: Compliance with therapeutic regimen will improve Outcome: Progressing   Problem: Education: Goal: Ability to make informed decisions regarding treatment will improve Outcome: Progressing   Problem: Self-Concept: Goal: Ability to disclose and discuss suicidal ideas will improve Outcome: Progressing   Problem: Safety: Goal: Periods of time without injury will increase Outcome: Progressing

## 2017-11-18 NOTE — Progress Notes (Signed)
[facility] MD Progress Note  11/18/2017 3:45 PM Crystal Duarte  MRN:  [id number] Subjective:   Nothing changed" Pt reports feeling depressed, tired, denies SI, somewhat  hopeful from  ECT starting on Monday.  Principal Problem: Severe recurrent major depression without psychotic features ([place]) Diagnosis:   Patient Active Problem List   Diagnosis Date Noted  . Suicidal ideation [R45.851] 11/17/2017  . Severe recurrent major depression without psychotic features ([place]) [F33.2] 11/16/2017  . Severe episode of recurrent major depressive disorder, without psychotic features ([place]) [F33.2] 07/18/2017  . DOE (dyspnea on exertion) [R06.09] 12/26/2016  . COPD GOLD II  [J44.9] 12/26/2016  . Orthostatic hypotension [I95.1] 12/14/2016  . Chest tightness [R07.89] 12/14/2016  . Weight loss [R63.4] 12/14/2016  . Routine general medical examination at a health care facility [Z00.00] 10/09/2015  . 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 Hx of adenomatous polyp of colon [Z86.010] 12/04/2014  . Memory loss [R41.3] 10/04/2014  . Affective bipolar disorder ([place]) [F31.9] 03/12/2014  . Peripheral vascular disease, unspecified ([place]) [I73.9] 10/10/2013  . Smoker [F17.200] 08/28/2012  . Menopause [Z78.0] 08/28/2012  . Multiple pulmonary nodules [R91.8] 11/12/2010   Total Time spent with patient: 25 min  Past Psychiatric History: no new info  Past Medical History:  Past Medical History:  Diagnosis Date  . Coccidioidomycosis, pulmonary ([place])   . Depression   . Hx of adenomatous polyp of colon 12/04/2014  . Osteopenia 11/2016   T score -2.2 FRAX 17%/2.4%  . Panic attack     Past Surgical History:  Procedure Laterality Date  . ABDOMINAL HYSTERECTOMY    . COLONOSCOPY  2001   hemorrhoidectomy  . LUNG SURGERY  2001   VATS surgery   . OVARIAN CYST REMOVAL  1970's  . VESICOVAGINAL FISTULA CLOSURE W/ TAH  2005   Family History:  Family History  Problem Relation Age of Onset  . Asthma Mother   .  Ovarian cancer Mother   . Cancer Mother        OVARIAN  . Varicose Veins Mother   . Heart disease Father   . Peripheral vascular disease Father   . Cancer Maternal Grandmother        LUNG- LUNG  . Colon cancer Neg Hx    Family Psychiatric  History: no new info Social History:  Social History   Substance and Sexual Activity  Alcohol Use No  . Alcohol/week: 0.0 standard drinks     Social History   Substance and Sexual Activity  Drug Use No    Social History   Socioeconomic History  . Marital status: Divorced    Spouse name: Not on file  . Number of children: 2  . Years of education: Not on file  . Highest education level: Not on file  Occupational History  . Occupation: Forensic psychologist: [employer]  . Financial resource strain: Not on file  . Food insecurity:    Worry: Not on file    Inability: Not on file  . Transportation needs:    Medical: Not on file    Non-medical: Not on file  Tobacco Use  . Smoking status: Current Some Day Smoker    Packs/day: 1.00    Years: 42.00    Pack years: 42.00    Types: Cigarettes    Last attempt to quit: 09/25/2016    Years since quitting: 1.1  . Smokeless tobacco: Former Systems developer    Quit date: 06/13/2016  Substance and Sexual  Activity  . Alcohol use: No    Alcohol/week: 0.0 standard drinks  . Drug use: No  . Sexual activity: Not on file  Lifestyle  . Physical activity:    Days per week: Not on file    Minutes per session: Not on file  . Stress: Not on file  Relationships  . Social connections:    Talks on phone: Not on file    Gets together: Not on file    Attends religious service: Not on file    Active member of club or organization: Not on file    Attends meetings of clubs or organizations: Not on file    Relationship status: Not on file  Other Topics Concern  . Not on file  Social History Narrative  . Not on file   Additional Social History:                         Sleep:  Fair  Appetite:  Fair  Current Medications: Current Facility-Administered Medications  Medication Dose Route Frequency Provider Last Rate Last Dose  . acetaminophen (TYLENOL) tablet 650 mg  650 mg Oral Q6H PRN Clapacs, John T, MD      . acidophilus (RISAQUAD) capsule 1 capsule  1 capsule Oral Daily Clapacs, Madie Reno, MD   1 capsule at 11/18/17 0859  . alum & mag hydroxide-simeth (MAALOX/MYLANTA) 200-200-20 MG/5ML suspension 30 mL  30 mL Oral Q4H PRN Clapacs, John T, MD      . busPIRone (BUSPAR) tablet 15 mg  15 mg Oral TID Clapacs, Madie Reno, MD   15 mg at 11/18/17 1204  . capsaicin (ZOSTRIX) 0.025 % cream   Topical BID Clapacs, John T, MD      . diclofenac sodium (VOLTAREN) 1 % transdermal gel 2 g  2 g Topical QID Clapacs, Madie Reno, MD   2 g at 11/17/17 2138  . gabapentin (NEURONTIN) capsule 400 mg  400 mg Oral TID Clapacs, Madie Reno, MD   400 mg at 11/18/17 1203  . hydrOXYzine (ATARAX/VISTARIL) tablet 25 mg  25 mg Oral TID PRN Clapacs, Madie Reno, MD   25 mg at 11/18/17 0900  . liothyronine (CYTOMEL) tablet 12.5 mcg  12.5 mcg Oral Daily Clapacs, Madie Reno, MD   12.5 mcg at 11/18/17 0859  . magnesium hydroxide (MILK OF MAGNESIA) suspension 30 mL  30 mL Oral Daily PRN Clapacs, John T, MD      . nabumetone (RELAFEN) tablet 500 mg  500 mg Oral BID Clapacs, Madie Reno, MD   500 mg at 11/18/17 0859  . propranolol (INDERAL) tablet 20 mg  20 mg Oral TID Clapacs, Madie Reno, MD   20 mg at 11/18/17 1204  . traZODone (DESYREL) tablet 200 mg  200 mg Oral QHS PRN Clapacs, Madie Reno, MD   200 mg at 11/17/17 2159  . vortioxetine HBr (TRINTELLIX) tablet 15 mg  15 mg Oral Daily Clapacs, Madie Reno, MD   15 mg at 11/18/17 0919    Lab Results:  Results for orders placed or performed during the hospital encounter of 11/16/17 (from the past 48 hour(s))  Hemoglobin A1c     Status: Abnormal   Collection Time: 11/18/17  6:43 AM  Result Value Ref Range   Hgb A1c MFr Bld 5.7 (H) 4.8 - 5.6 %    Comment: (NOTE) Pre diabetes:           5.7%-6.4% Diabetes:              >  6.4% Glycemic control for   <7.0% adults with diabetes    Mean Plasma Glucose 116.89 mg/dL    Comment: Performed at [facility] [address] [place], [place] [address]  Lipid panel     Status: Abnormal   Collection Time: 11/18/17  6:43 AM  Result Value Ref Range   Cholesterol 232 (H) 0 - 200 mg/dL   Triglycerides 230 (H) <150 mg/dL   HDL 49 >40 mg/dL   Total CHOL/HDL Ratio 4.7 RATIO   VLDL 46 (H) 0 - 40 mg/dL   LDL Cholesterol 137 (H) 0 - 99 mg/dL    Comment:        Total Cholesterol/HDL:CHD Risk Coronary Heart Disease Risk Table                     Men   Women  1/2 Average Risk   3.4   3.3  Average Risk       5.0   4.4  2 X Average Risk   9.6   7.1  3 X Average Risk  23.4   11.0        Use the calculated Patient Ratio above and the CHD Risk Table to determine the patient's CHD Risk.        ATP III CLASSIFICATION (LDL):  <100     mg/dL   Optimal  100-129  mg/dL   Near or Above                    Optimal  130-159  mg/dL   Borderline  160-189  mg/dL   High  >190     mg/dL   Very High Performed at [facility], [address] [place], [place] [address]   TSH     Status: None   Collection Time: 11/18/17  6:43 AM  Result Value Ref Range   TSH 0.583 0.350 - 4.500 uIU/mL    Comment: Performed by a 3rd Generation assay with a functional sensitivity of <=0.01 uIU/mL. Performed at [facility], [address] [place],  [address]     Blood Alcohol level:  Lab Results  Component Value Date   [facility] <10 11/02/2017   ETH <10 38/18/2993    Metabolic Disorder Labs: Lab Results  Component Value Date   HGBA1C 5.7 (H) 11/18/2017   MPG 116.89 11/18/2017   MPG 108.28 07/21/2017   No results found for: PROLACTIN Lab Results  Component Value Date   CHOL 232 (H) 11/18/2017   TRIG 230 (H) 11/18/2017   HDL 49 11/18/2017   CHOLHDL 4.7 11/18/2017   VLDL 46 (H) 11/18/2017   LDLCALC 137 (H) 11/18/2017    LDLCALC 104 (H) 07/21/2017    Physical Findings: AIMS: Facial and Oral Movements Muscles of Facial Expression: None, normal Lips and Perioral Area: None, normal Jaw: None, normal Tongue: None, normal,Extremity Movements Upper (arms, wrists, hands, fingers): None, normal Lower (legs, knees, ankles, toes): None, normal, Trunk Movements Neck, shoulders, hips: None, normal, Overall Severity Severity of abnormal movements (highest score from questions above): None, normal Incapacitation due to abnormal movements: None, normal Patient's awareness of abnormal movements (rate only patient's report): No Awareness, Dental Status Current problems with teeth and/or dentures?: No Does patient usually wear dentures?: No  CIWA:    COWS:     Musculoskeletal: Strength & Muscle Tone: within normal limits Gait & Station: normal Patient leans:   Psychiatric Specialty Exam: Physical Exam  Nursing note and vitals  reviewed.   ROS  Blood pressure 99/70, pulse 82, temperature 98.2 F (36.8 C), temperature source Oral, resp. rate 16, height 5\' 5"  (1.651 m), weight 85.3 kg, SpO2 99 %.Body mass index is 31.28 kg/m.   General Appearance: Casual  Eye Contact:  Good  Speech:  Clear and Coherent  Volume:  Normal  Mood:  Depressed and Dysphoric  Affect:  Congruent  Thought Process:  Goal Directed  Orientation:  Full (Time, Place, and Person)  Thought Content:  depressed  Suicidal Thoughts:  denies  Homicidal Thoughts:  No  Memory:  Immediate;   Fair Recent;   Fair Remote;   Fair  Judgement:  Fair  Insight:  Fair  Psychomotor Activity:  Decreased  Concentration:  Concentration: Fair  Recall:  [employer] of Knowledge:  Fair  Language:  Fair  Akathisia:  No  Handed:  Right  AIMS (if indicated):     Assets:  Desire for Improvement Housing Physical Health Resilience Social Support  ADL's:  Intact  Cognition:  WNL  Sleep:  Number of Hours: 6.75     Treatment Plan Summary: Daily contact  with patient to assess and evaluate symptoms and progress in treatment and Medication management  Continue current medication- trintellixa   and start ECT Monday.   Lenward Chancellor, MD 11/18/2017, 3:45 PM

## 2017-11-18 NOTE — BHH Group Notes (Signed)
LCSW Group Therapy Note  11/18/2017 1:15pm  Type of Therapy and Topic:  Group Therapy:  Healthy Self Image and Positive Change  Participation Level:  Active   Description of Group:  In this group, patients will compare and contrast their current "I am...." statements to the visions they identify as desirable for their lives.  Patients discuss fears and how they can make positive changes in their cognitions that will positively impact their behaviors.  Facilitator played a motivational 3-minute speech and patients were left with the task of thinking about what "I am...." statements they can start using in their lives immediately.  Therapeutic Goals: 1. Patient will state their current self-perception as expressed in an "I Am" statement 2. Patient will contrast this with their desired vision for their live 3. Patient will identify 3 fears that negatively impact their behavior 4. Patient will discuss cognitive distortions that stem from their fears 5. Patient will verbalize statements that challenge their cognitive distortions  Summary of Patient Progress:  The patient reported that  she feels "okay today." The patient stated, "I am a good mother and grandmother" Patient discussed her fears and how he can make positive changes in their cognitions that will positively impact her behaviors. Patient was able to discuss and process cognitive distortions that stem from her fears. Patient actively and appropriately engaged in the group. Patient was able to provide support and validation to other group members. Patient practiced active listening when interacting with the facilitator and other group members.    Therapeutic Modalities Cognitive Behavioral Therapy Motivational Interviewing  Angeldejesus Callaham  CUEBAS-COLON, LCSW 11/18/2017 12:15 PM

## 2017-11-19 ENCOUNTER — Other Ambulatory Visit: Payer: Self-pay | Admitting: Psychiatry

## 2017-11-19 NOTE — Progress Notes (Signed)
[facility] MD Progress Note  11/19/2017 4:30 PM Breella Vanostrand  MRN:  [id number] Subjective:   "tired" Pt reports feeling depressed, tired. Denies AVH, pt isolative in room. Taking meds.  Principal Problem: Severe recurrent major depression without psychotic features ([place]) Diagnosis:   Patient Active Problem List   Diagnosis Date Noted  . Suicidal ideation [R45.851] 11/17/2017  . Severe recurrent major depression without psychotic features ([place]) [F33.2] 11/16/2017  . Severe episode of recurrent major depressive disorder, without psychotic features ([place]) [F33.2] 07/18/2017  . DOE (dyspnea on exertion) [R06.09] 12/26/2016  . COPD GOLD II  [J44.9] 12/26/2016  . Orthostatic hypotension [I95.1] 12/14/2016  . Chest tightness [R07.89] 12/14/2016  . Weight loss [R63.4] 12/14/2016  . Routine general medical examination at a health care facility [Z00.00] 10/09/2015  . 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 Hx of adenomatous polyp of colon [Z86.010] 12/04/2014  . Memory loss [R41.3] 10/04/2014  . Affective bipolar disorder ([place]) [F31.9] 03/12/2014  . Peripheral vascular disease, unspecified ([place]) [I73.9] 10/10/2013  . Smoker [F17.200] 08/28/2012  . Menopause [Z78.0] 08/28/2012  . Multiple pulmonary nodules [R91.8] 11/12/2010   Total Time spent with patient: 25 min  Past Psychiatric History: no new info  Past Medical History:  Past Medical History:  Diagnosis Date  . Coccidioidomycosis, pulmonary ([place])   . Depression   . Hx of adenomatous polyp of colon 12/04/2014  . Osteopenia 11/2016   T score -2.2 FRAX 17%/2.4%  . Panic attack     Past Surgical History:  Procedure Laterality Date  . ABDOMINAL HYSTERECTOMY    . COLONOSCOPY  2001   hemorrhoidectomy  . LUNG SURGERY  2001   VATS surgery   . OVARIAN CYST REMOVAL  1970's  . VESICOVAGINAL FISTULA CLOSURE W/ TAH  2005   Family History:  Family History  Problem Relation Age of Onset  . Asthma Mother   . Ovarian cancer Mother    . Cancer Mother        OVARIAN  . Varicose Veins Mother   . Heart disease Father   . Peripheral vascular disease Father   . Cancer Maternal Grandmother        LUNG- LUNG  . Colon cancer Neg Hx    Family Psychiatric  History: no new info Social History:  Social History   Substance and Sexual Activity  Alcohol Use No  . Alcohol/week: 0.0 standard drinks     Social History   Substance and Sexual Activity  Drug Use No    Social History   Socioeconomic History  . Marital status: Divorced    Spouse name: Not on file  . Number of children: 2  . Years of education: Not on file  . Highest education level: Not on file  Occupational History  . Occupation: Forensic psychologist: [employer]  . Financial resource strain: Not on file  . Food insecurity:    Worry: Not on file    Inability: Not on file  . Transportation needs:    Medical: Not on file    Non-medical: Not on file  Tobacco Use  . Smoking status: Current Some Day Smoker    Packs/day: 1.00    Years: 42.00    Pack years: 42.00    Types: Cigarettes    Last attempt to quit: 09/25/2016    Years since quitting: 1.1  . Smokeless tobacco: Former Systems developer    Quit date: 06/13/2016  Substance and Sexual Activity  . Alcohol  use: No    Alcohol/week: 0.0 standard drinks  . Drug use: No  . Sexual activity: Not on file  Lifestyle  . Physical activity:    Days per week: Not on file    Minutes per session: Not on file  . Stress: Not on file  Relationships  . Social connections:    Talks on phone: Not on file    Gets together: Not on file    Attends religious service: Not on file    Active member of club or organization: Not on file    Attends meetings of clubs or organizations: Not on file    Relationship status: Not on file  Other Topics Concern  . Not on file  Social History Narrative  . Not on file   Additional Social History:                         Sleep: Fair  Appetite:   Fair  Current Medications: Current Facility-Administered Medications  Medication Dose Route Frequency Provider Last Rate Last Dose  . acetaminophen (TYLENOL) tablet 650 mg  650 mg Oral Q6H PRN Clapacs, John T, MD      . acidophilus (RISAQUAD) capsule 1 capsule  1 capsule Oral Daily Clapacs, John T, MD   1 capsule at 11/19/17 0900  . alum & mag hydroxide-simeth (MAALOX/MYLANTA) 200-200-20 MG/5ML suspension 30 mL  30 mL Oral Q4H PRN Clapacs, John T, MD      . busPIRone (BUSPAR) tablet 15 mg  15 mg Oral TID Clapacs, Madie Reno, MD   15 mg at 11/19/17 1231  . capsaicin (ZOSTRIX) 0.025 % cream   Topical BID Clapacs, John T, MD      . diclofenac sodium (VOLTAREN) 1 % transdermal gel 2 g  2 g Topical QID Clapacs, Madie Reno, MD   2 g at 11/17/17 2138  . gabapentin (NEURONTIN) capsule 400 mg  400 mg Oral TID Clapacs, John T, MD   400 mg at 11/19/17 1230  . hydrOXYzine (ATARAX/VISTARIL) tablet 25 mg  25 mg Oral TID PRN Clapacs, Madie Reno, MD   25 mg at 11/18/17 2130  . liothyronine (CYTOMEL) tablet 12.5 mcg  12.5 mcg Oral Daily Clapacs, John T, MD   12.5 mcg at 11/19/17 0900  . magnesium hydroxide (MILK OF MAGNESIA) suspension 30 mL  30 mL Oral Daily PRN Clapacs, John T, MD      . nabumetone (RELAFEN) tablet 500 mg  500 mg Oral BID Clapacs, John T, MD   500 mg at 11/19/17 0900  . propranolol (INDERAL) tablet 20 mg  20 mg Oral TID Clapacs, Madie Reno, MD   20 mg at 11/19/17 1230  . traZODone (DESYREL) tablet 200 mg  200 mg Oral QHS PRN Clapacs, Madie Reno, MD   200 mg at 11/18/17 2130  . vortioxetine HBr (TRINTELLIX) tablet 15 mg  15 mg Oral Daily Clapacs, John T, MD   15 mg at 11/19/17 0930    Lab Results:  Results for orders placed or performed during the hospital encounter of 11/16/17 (from the past 48 hour(s))  Hemoglobin A1c     Status: Abnormal   Collection Time: 11/18/17  6:43 AM  Result Value Ref Range   Hgb A1c MFr Bld 5.7 (H) 4.8 - 5.6 %    Comment: (NOTE) Pre diabetes:          5.7%-6.4% Diabetes:               >  6.4% Glycemic control for   <7.0% adults with diabetes    Mean Plasma Glucose 116.89 mg/dL    Comment: Performed at [facility] [address] [place], [place] [address]  Lipid panel     Status: Abnormal   Collection Time: 11/18/17  6:43 AM  Result Value Ref Range   Cholesterol 232 (H) 0 - 200 mg/dL   Triglycerides 230 (H) <150 mg/dL   HDL 49 >40 mg/dL   Total CHOL/HDL Ratio 4.7 RATIO   VLDL 46 (H) 0 - 40 mg/dL   LDL Cholesterol 137 (H) 0 - 99 mg/dL    Comment:        Total Cholesterol/HDL:CHD Risk Coronary Heart Disease Risk Table                     Men   Women  1/2 Average Risk   3.4   3.3  Average Risk       5.0   4.4  2 X Average Risk   9.6   7.1  3 X Average Risk  23.4   11.0        Use the calculated Patient Ratio above and the CHD Risk Table to determine the patient's CHD Risk.        ATP III CLASSIFICATION (LDL):  <100     mg/dL   Optimal  100-129  mg/dL   Near or Above                    Optimal  130-159  mg/dL   Borderline  160-189  mg/dL   High  >190     mg/dL   Very High Performed at [facility], [address] [place], [place] [address]   TSH     Status: None   Collection Time: 11/18/17  6:43 AM  Result Value Ref Range   TSH 0.583 0.350 - 4.500 uIU/mL    Comment: Performed by a 3rd Generation assay with a functional sensitivity of <=0.01 uIU/mL. Performed at [facility], [address] [place],  [address]     Blood Alcohol level:  Lab Results  Component Value Date   [facility] <10 11/02/2017   ETH <10 32/44/0102    Metabolic Disorder Labs: Lab Results  Component Value Date   HGBA1C 5.7 (H) 11/18/2017   MPG 116.89 11/18/2017   MPG 108.28 07/21/2017   No results found for: PROLACTIN Lab Results  Component Value Date   CHOL 232 (H) 11/18/2017   TRIG 230 (H) 11/18/2017   HDL 49 11/18/2017   CHOLHDL 4.7 11/18/2017   VLDL 46 (H) 11/18/2017   LDLCALC 137 (H) 11/18/2017   LDLCALC 104 (H) 07/21/2017     Physical Findings: AIMS: Facial and Oral Movements Muscles of Facial Expression: None, normal Lips and Perioral Area: None, normal Jaw: None, normal Tongue: None, normal,Extremity Movements Upper (arms, wrists, hands, fingers): None, normal Lower (legs, knees, ankles, toes): None, normal, Trunk Movements Neck, shoulders, hips: None, normal, Overall Severity Severity of abnormal movements (highest score from questions above): None, normal Incapacitation due to abnormal movements: None, normal Patient's awareness of abnormal movements (rate only patient's report): No Awareness, Dental Status Current problems with teeth and/or dentures?: No Does patient usually wear dentures?: No  CIWA:    COWS:     Musculoskeletal: Strength & Muscle Tone: within normal limits Gait & Station: normal Patient leans:   Psychiatric Specialty Exam: Physical Exam  Nursing note and vitals  reviewed.   ROS  Blood pressure 110/70, pulse 78, temperature 98.9 F (37.2 C), temperature source Oral, resp. rate 18, height 5\' 5"  (1.651 m), weight 85.3 kg, SpO2 96 %.Body mass index is 31.28 kg/m.   General Appearance: Casual  Eye Contact:  Good  Speech:  Clear and Coherent  Volume:  Normal  Mood:  Depressed , tired  Affect:  Congruent  Thought Process:  Goal Directed  Orientation:  Full (Time, Place, and Person)  Thought Content:  depressed  Suicidal Thoughts:  denies  Homicidal Thoughts:  No  Memory:  Immediate;   Fair Recent;   Fair Remote;   Fair  Judgement:  Fair  Insight:  Fair  Psychomotor Activity:  Decreased  Concentration:  Concentration: Fair  Recall:  [employer] of Knowledge:  Fair  Language:  Fair  Akathisia:  No  Handed:  Right  AIMS (if indicated):     Assets:  Desire for Improvement Housing Physical Health Resilience Social Support  ADL's:  Intact  Cognition:  WNL  Sleep:  Number of Hours: 6.75     Treatment Plan Summary: Daily contact with patient to assess and  evaluate symptoms and progress in treatment and Medication management . Pt still severely depressed.  Continue current medication- trintellixa   and start ECT Monday.   Lenward Chancellor, MD 11/19/2017, 4:30 PMPatient ID: [name], female   DOB: 1960-12-20, 57 y.o.   MRN: [id number]

## 2017-11-19 NOTE — BHH Group Notes (Signed)
LCSW Group Therapy Note 11/19/2017 1:15pm  Type of Therapy and Topic: Group Therapy: Feelings Around Returning Home & Establishing a Supportive Framework and Supporting Oneself When Supports Not Available  Participation Level: Did Not Attend  Description of Group:  Patients first processed thoughts and feelings about upcoming discharge. These included fears of upcoming changes, lack of change, new living environments, judgements and expectations from others and overall stigma of mental health issues. The group then discussed the definition of a supportive framework, what that looks and feels like, and how do to discern it from an unhealthy non-supportive network. The group identified different types of supports as well as what to do when your family/friends are less than helpful or unavailable  Therapeutic Goals  1. Patient will identify one healthy supportive network that they can use at discharge. 2. Patient will identify one factor of a supportive framework and how to tell it from an unhealthy network. 3. Patient able to identify one coping skill to use when they do not have positive supports from others. 4. Patient will demonstrate ability to communicate their needs through discussion and/or role plays.  Summary of Patient Progress:  Pt was invited to attend group but chose not to attend. CSW will continue to encourage pt to attend group throughout their admission.   Therapeutic Modalities Cognitive Behavioral Therapy Motivational Interviewing   Suezette Lafave  CUEBAS-COLON, LCSW 11/19/2017 12:45 PM

## 2017-11-19 NOTE — Progress Notes (Signed)
Patient was visible in the milieu until bedtime. Alert and oriented. Denying suicidal thoughts. Denying hallucinations. Expressing hopelessness and helplessness. Patient attended HS group and had a snack. Presented to the medication room for HS medications. Refused Voltaren gel reporting that she does not need it. Requested Vistaril for restlessness. Had no major concern. Currently in bed sleeping. No sign of distress. Safety and security maintained.

## 2017-11-19 NOTE — [facility] Group Notes (Signed)
[facility] Group Notes:  (Nursing/MHT/Case Management/Adjunct)  Date:  11/19/2017  Time:  12:22 AM  Type of Therapy:  Group Therapy  Participation Level:  Active  Participation Quality:  Appropriate  Affect:  Appropriate  Cognitive:  Appropriate  Insight:  Appropriate  Engagement in Group:  Limited  Modes of Intervention:  Socialization  Summary of Progress/Problems: Adelma attended group, but did not want to share. Noya listened to others, but did not add to group conversation. Barnie Mort 11/19/2017, 12:22 AM

## 2017-11-19 NOTE — Plan of Care (Signed)
D: Pt denies SI/HI/AV hallucinations. Patient continues with a flat affect. Patient rated her anxiety a/10, depression a 6/10 and her hopelessness 8/10.  A: Pt was offered support and encouragement. Pt was given scheduled medications. Pt was encourage to attend groups. Q 15 minute checks were done for safety.  R:Pt attends groups and interacts well with peers and staff. Pt is taking medication. Pt has no complaints.Pt receptive to treatment and safety maintained on unit.    Problem: Education: Goal: Knowledge of the prescribed therapeutic regimen will improve Outcome: Progressing   Problem: Safety: Goal: Periods of time without injury will increase Outcome: Progressing   Problem: Safety: Goal: Ability to remain free from injury will improve Outcome: Progressing

## 2017-11-19 NOTE — Plan of Care (Signed)
Pleasant and compliant with treatment

## 2017-11-20 ENCOUNTER — Inpatient Hospital Stay: Payer: Medicare HMO | Admitting: Anesthesiology

## 2017-11-20 ENCOUNTER — Inpatient Hospital Stay: Payer: Medicare HMO

## 2017-11-20 ENCOUNTER — Encounter: Payer: Self-pay | Admitting: Anesthesiology

## 2017-11-20 DIAGNOSIS — R69 Illness, unspecified: Secondary | ICD-10-CM | POA: Diagnosis not present

## 2017-11-20 LAB — GLUCOSE, CAPILLARY: Glucose-Capillary: 102 mg/dL — ABNORMAL HIGH (ref 70–99)

## 2017-11-20 MED ORDER — SODIUM CHLORIDE 0.9 % IV SOLN
500.0000 mL | Freq: Once | INTRAVENOUS | Status: AC
  Administered 2017-11-20: 11:00:00 via INTRAVENOUS

## 2017-11-20 MED ORDER — SUCCINYLCHOLINE CHLORIDE 200 MG/10ML IV SOSY
PREFILLED_SYRINGE | INTRAVENOUS | Status: DC | PRN
  Administered 2017-11-20: 100 mg via INTRAVENOUS

## 2017-11-20 MED ORDER — SUCCINYLCHOLINE CHLORIDE 20 MG/ML IJ SOLN
INTRAMUSCULAR | Status: AC
  Filled 2017-11-20: qty 1

## 2017-11-20 MED ORDER — METHOHEXITAL SODIUM 100 MG/10ML IV SOSY
PREFILLED_SYRINGE | INTRAVENOUS | Status: DC | PRN
  Administered 2017-11-20: 80 mg via INTRAVENOUS

## 2017-11-20 NOTE — Progress Notes (Signed)
Patient was visible in the milieu until bed time. Alert and oriented. Denying suicidal/homicidal thoughts. Expressed motivation for ECT and said that she gets some relief from it. Patient attended HS group activities. Had a snack and presented to the medication room. Discussed about her mental illness and stated " I have had depression since I was 14. I am glad my children did not get it..." Patient mentioned that she lives with her elderly mother and caring for her is a one of the stressors. Received HS medications. Emotional support provided. Patient went to bed and slept throughout the night. Had no major concern throughout this shift. Staff continue to monitor for safety.

## 2017-11-20 NOTE — Anesthesia Post-op Follow-up Note (Signed)
Anesthesia QCDR form completed.        

## 2017-11-20 NOTE — Transfer of Care (Signed)
Immediate Anesthesia Transfer of Care Note  Patient: Crystal Duarte  Procedure(s) Performed: ECT TX  Patient Location: PACU  Anesthesia Type:General  Level of Consciousness: sedated  Airway & Oxygen Therapy: Patient Spontanous Breathing and Patient connected to face mask oxygen  Post-op Assessment: Report given to RN and Post -op Vital signs reviewed and stable  Post vital signs: Reviewed and stable  Last Vitals:  Vitals Value Taken Time  BP    Temp    Pulse 88 11/20/2017 11:48 AM  Resp 23 11/20/2017 11:48 AM  SpO2 100 % 11/20/2017 11:48 AM  Vitals shown include unvalidated device data.  Last Pain:  Vitals:   11/20/17 1019  TempSrc: Oral  PainSc: 5       Patients Stated Pain Goal: 0 (87/27/61 [id number])  Complications: No apparent anesthesia complications

## 2017-11-20 NOTE — Progress Notes (Addendum)
Received Crystal Duarte this AM in route to ECT. She returned from ECT without incident. Her lunch tray was ordered and PACU was called  to retrieve her jacket. Her post ECT status was assessed, she ate and OOB in the milieu with her peers. On her self inventory sheet she rated depression 6/10,anxiety 6/10 and feeling hopelessness 6/10.

## 2017-11-20 NOTE — Anesthesia Preprocedure Evaluation (Deleted)
Anesthesia Evaluation    Airway        Dental   Pulmonary COPD, Current Smoker,           Cardiovascular + Peripheral Vascular Disease and + DOE       Neuro/Psych PSYCHIATRIC DISORDERS Anxiety Depression Bipolar Disorder    GI/Hepatic   Endo/Other    Renal/GU      Musculoskeletal   Abdominal   Peds  Hematology   Anesthesia Other Findings   Reproductive/Obstetrics                             Anesthesia Physical Anesthesia Plan  ASA: III  Anesthesia Plan:    Post-op Pain Management:    Induction:   PONV Risk Score and Plan: TIVA  Airway Management Planned:   Additional Equipment:   Intra-op Plan:   Post-operative Plan:   Informed Consent: I have reviewed the patients History and Physical, chart, labs and discussed the procedure including the risks, benefits and alternatives for the proposed anesthesia with the patient or authorized representative who has indicated his/her understanding and acceptance.   Dental Advisory Given  Plan Discussed with: Anesthesiologist and CRNA  Anesthesia Plan Comments:         Anesthesia Quick Evaluation

## 2017-11-20 NOTE — Anesthesia Postprocedure Evaluation (Signed)
Anesthesia Post Note  Patient: Evvie Behrmann Claar  Procedure(s) Performed: ECT TX  Patient location during evaluation: PACU Anesthesia Type: General Level of consciousness: awake and alert Pain management: pain level controlled Vital Signs Assessment: post-procedure vital signs reviewed and stable Respiratory status: spontaneous breathing, nonlabored ventilation, respiratory function stable and patient connected to nasal cannula oxygen Cardiovascular status: blood pressure returned to baseline and stable Postop Assessment: no apparent nausea or vomiting Anesthetic complications: no     Last Vitals:  Vitals:   11/20/17 1157 11/20/17 1207  BP: (!) 159/83 133/78  Pulse: 86 75  Resp:  15  Temp:    SpO2: 93% 94%    Last Pain:  Vitals:   11/20/17 1207  TempSrc:   PainSc: 0-No pain                 Precious Haws Piscitello

## 2017-11-20 NOTE — Anesthesia Preprocedure Evaluation (Signed)
Anesthesia Evaluation  Patient identified by MRN, date of birth, ID band Patient awake    Reviewed: Allergy & Precautions, H&P , NPO status , Patient's Chart, lab work & pertinent test results  History of Anesthesia Complications Negative for: history of anesthetic complications  Airway Mallampati: III  TM Distance: >3 FB Neck ROM: full    Dental  (+) Chipped, Poor Dentition   Pulmonary shortness of breath and with exertion, COPD, Current Smoker,           Cardiovascular Exercise Tolerance: Good + Peripheral Vascular Disease and + DOE  (-) Valvular Problems/Murmurs     Neuro/Psych PSYCHIATRIC DISORDERS Anxiety Depression Bipolar Disorder negative neurological ROS     GI/Hepatic negative GI ROS, Neg liver ROS, neg GERD  ,  Endo/Other  negative endocrine ROS  Renal/GU negative Renal ROS  negative genitourinary   Musculoskeletal   Abdominal   Peds  Hematology negative hematology ROS (+)   Anesthesia Other Findings Past Medical History: No date: Coccidioidomycosis, pulmonary (HCC) No date: Depression 12/04/2014: Hx of adenomatous polyp of colon 11/2016: Osteopenia     Comment:  T score -2.2 FRAX 17%/2.4% No date: Panic attack  Past Surgical History: No date: ABDOMINAL HYSTERECTOMY 2001: COLONOSCOPY     Comment:  hemorrhoidectomy 2001: LUNG SURGERY     Comment:  VATS surgery  1970's: OVARIAN CYST REMOVAL 2005: VESICOVAGINAL FISTULA CLOSURE W/ TAH  BMI    Body Mass Index:  31.28 kg/m      Reproductive/Obstetrics negative OB ROS                             Anesthesia Physical Anesthesia Plan  ASA: III  Anesthesia Plan: General   Post-op Pain Management:    Induction: Intravenous  PONV Risk Score and Plan: TIVA  Airway Management Planned: Natural Airway and Mask  Additional Equipment:   Intra-op Plan:   Post-operative Plan:   Informed Consent: I have reviewed the  patients History and Physical, chart, labs and discussed the procedure including the risks, benefits and alternatives for the proposed anesthesia with the patient or authorized representative who has indicated his/her understanding and acceptance.   Dental Advisory Given  Plan Discussed with: Anesthesiologist, CRNA and Surgeon  Anesthesia Plan Comments: (Patient consented for risks of anesthesia including but not limited to:  - adverse reactions to medications - risk of intubation if required - damage to teeth, lips or other oral mucosa - sore throat or hoarseness - Damage to heart, brain, lungs or loss of life  Patient voiced understanding.)        Anesthesia Quick Evaluation

## 2017-11-20 NOTE — Anesthesia Procedure Notes (Signed)
Date/Time: 11/20/2017 11:37 AM Performed by: Dionne Bucy, CRNA Pre-anesthesia Checklist: Patient identified, Emergency Drugs available, Suction available and Patient being monitored Patient Re-evaluated:Patient Re-evaluated prior to induction Oxygen Delivery Method: Circle system utilized Preoxygenation: Pre-oxygenation with 100% oxygen Induction Type: IV induction Ventilation: Mask ventilation without difficulty and Mask ventilation throughout procedure Airway Equipment and Method: Bite block Placement Confirmation: positive ETCO2 Dental Injury: Teeth and Oropharynx as per pre-operative assessment

## 2017-11-20 NOTE — Plan of Care (Signed)
Patient is hopeful, reporting that she is ready for ECT. Pleasant and compliant with treatment

## 2017-11-20 NOTE — Progress Notes (Signed)
[facility] MD Progress Note  11/20/2017 7:34 PM Crystal Duarte  MRN:  [id number] Subjective: Follow-up for patient with major depression.  Had ECT today.  Mood continues to feel depressed with some anxiety but no active suicidal ideation no psychotic symptoms.  This evening after ECT has some muscle soreness all over but no other complaints. Principal Problem: Severe recurrent major depression without psychotic features ([place]) Diagnosis:   Patient Active Problem List   Diagnosis Date Noted  . Suicidal ideation [R45.851] 11/17/2017  . Severe recurrent major depression without psychotic features ([place]) [F33.2] 11/16/2017  . Severe episode of recurrent major depressive disorder, without psychotic features ([place]) [F33.2] 07/18/2017  . DOE (dyspnea on exertion) [R06.09] 12/26/2016  . COPD GOLD II  [J44.9] 12/26/2016  . Orthostatic hypotension [I95.1] 12/14/2016  . Chest tightness [R07.89] 12/14/2016  . Weight loss [R63.4] 12/14/2016  . Routine general medical examination at a health care facility [Z00.00] 10/09/2015  . 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 Hx of adenomatous polyp of colon [Z86.010] 12/04/2014  . Memory loss [R41.3] 10/04/2014  . Affective bipolar disorder ([place]) [F31.9] 03/12/2014  . Peripheral vascular disease, unspecified ([place]) [I73.9] 10/10/2013  . Smoker [F17.200] 08/28/2012  . Menopause [Z78.0] 08/28/2012  . Multiple pulmonary nodules [R91.8] 11/12/2010   Total Time spent with patient: 30 minutes  Past Psychiatric History: Long history of depression with positive response to ECT in the past  Past Medical History:  Past Medical History:  Diagnosis Date  . Coccidioidomycosis, pulmonary ([place])   . Depression   . Hx of adenomatous polyp of colon 12/04/2014  . Osteopenia 11/2016   T score -2.2 FRAX 17%/2.4%  . Panic attack     Past Surgical History:  Procedure Laterality Date  . ABDOMINAL HYSTERECTOMY    . COLONOSCOPY  2001   hemorrhoidectomy  . LUNG SURGERY   2001   VATS surgery   . OVARIAN CYST REMOVAL  1970's  . VESICOVAGINAL FISTULA CLOSURE W/ TAH  2005   Family History:  Family History  Problem Relation Age of Onset  . Asthma Mother   . Ovarian cancer Mother   . Cancer Mother        OVARIAN  . Varicose Veins Mother   . Heart disease Father   . Peripheral vascular disease Father   . Cancer Maternal Grandmother        LUNG- LUNG  . Colon cancer Neg Hx    Family Psychiatric  History: Depression Social History:  Social History   Substance and Sexual Activity  Alcohol Use No  . Alcohol/week: 0.0 standard drinks     Social History   Substance and Sexual Activity  Drug Use No    Social History   Socioeconomic History  . Marital status: Divorced    Spouse name: Not on file  . Number of children: 2  . Years of education: Not on file  . Highest education level: Not on file  Occupational History  . Occupation: Forensic psychologist: [employer]  . Financial resource strain: Not on file  . Food insecurity:    Worry: Not on file    Inability: Not on file  . Transportation needs:    Medical: Not on file    Non-medical: Not on file  Tobacco Use  . Smoking status: Current Some Day Smoker    Packs/day: 1.00    Years: 42.00    Pack years: 42.00    Types: Cigarettes  Last attempt to quit: 09/25/2016    Years since quitting: 1.1  . Smokeless tobacco: Former Systems developer    Quit date: 06/13/2016  Substance and Sexual Activity  . Alcohol use: No    Alcohol/week: 0.0 standard drinks  . Drug use: No  . Sexual activity: Not on file  Lifestyle  . Physical activity:    Days per week: Not on file    Minutes per session: Not on file  . Stress: Not on file  Relationships  . Social connections:    Talks on phone: Not on file    Gets together: Not on file    Attends religious service: Not on file    Active member of club or organization: Not on file    Attends meetings of clubs or organizations: Not on file     Relationship status: Not on file  Other Topics Concern  . Not on file  Social History Narrative  . Not on file   Additional Social History:                         Sleep: Good  Appetite:  Good  Current Medications: Current Facility-Administered Medications  Medication Dose Route Frequency Provider Last Rate Last Dose  . acetaminophen (TYLENOL) tablet 650 mg  650 mg Oral Q6H PRN Jes Costales, Madie Reno, MD   650 mg at 11/20/17 1418  . acidophilus (RISAQUAD) capsule 1 capsule  1 capsule Oral Daily Orhan Mayorga, Madie Reno, MD   1 capsule at 11/20/17 1404  . alum & mag hydroxide-simeth (MAALOX/MYLANTA) 200-200-20 MG/5ML suspension 30 mL  30 mL Oral Q4H PRN Jailani Hogans T, MD      . busPIRone (BUSPAR) tablet 15 mg  15 mg Oral TID Avier Jech, Madie Reno, MD   15 mg at 11/20/17 1822  . capsaicin (ZOSTRIX) 0.025 % cream   Topical BID Yaneth Fairbairn T, MD      . diclofenac sodium (VOLTAREN) 1 % transdermal gel 2 g  2 g Topical QID Fermina Mishkin, Madie Reno, MD   2 g at 11/17/17 2138  . gabapentin (NEURONTIN) capsule 400 mg  400 mg Oral TID Jacqueleen Pulver, Madie Reno, MD   400 mg at 11/20/17 1823  . hydrOXYzine (ATARAX/VISTARIL) tablet 25 mg  25 mg Oral TID PRN Rebecca Motta, Madie Reno, MD   25 mg at 11/20/17 1409  . liothyronine (CYTOMEL) tablet 12.5 mcg  12.5 mcg Oral Daily Metta Koranda, Madie Reno, MD   12.5 mcg at 11/20/17 1405  . magnesium hydroxide (MILK OF MAGNESIA) suspension 30 mL  30 mL Oral Daily PRN Susie Pousson T, MD      . nabumetone (RELAFEN) tablet 500 mg  500 mg Oral BID Corayma Cashatt, Madie Reno, MD   500 mg at 11/20/17 1823  . propranolol (INDERAL) tablet 20 mg  20 mg Oral TID Jamarkus Lisbon, Madie Reno, MD   20 mg at 11/20/17 1404  . traZODone (DESYREL) tablet 200 mg  200 mg Oral QHS PRN Odel Schmid, Madie Reno, MD   200 mg at 11/19/17 2139  . vortioxetine HBr (TRINTELLIX) tablet 15 mg  15 mg Oral Daily Srinivas Lippman, Madie Reno, MD   15 mg at 11/20/17 1824    Lab Results:  Results for orders placed or performed during the hospital encounter of 11/16/17  (from the past 48 hour(s))  Glucose, capillary     Status: Abnormal   Collection Time: 11/20/17  7:06 AM  Result Value Ref Range   Glucose-Capillary 102 (H) 70 -  99 mg/dL    Blood Alcohol level:  Lab Results  Component Value Date   ETH <10 11/02/2017   ETH <10 96/78/9381    Metabolic Disorder Labs: Lab Results  Component Value Date   HGBA1C 5.7 (H) 11/18/2017   MPG 116.89 11/18/2017   MPG 108.28 07/21/2017   No results found for: PROLACTIN Lab Results  Component Value Date   CHOL 232 (H) 11/18/2017   TRIG 230 (H) 11/18/2017   HDL 49 11/18/2017   CHOLHDL 4.7 11/18/2017   VLDL 46 (H) 11/18/2017   LDLCALC 137 (H) 11/18/2017   LDLCALC 104 (H) 07/21/2017    Physical Findings: AIMS: Facial and Oral Movements Muscles of Facial Expression: None, normal Lips and Perioral Area: None, normal Jaw: None, normal Tongue: None, normal,Extremity Movements Upper (arms, wrists, hands, fingers): None, normal Lower (legs, knees, ankles, toes): None, normal, Trunk Movements Neck, shoulders, hips: None, normal, Overall Severity Severity of abnormal movements (highest score from questions above): None, normal Incapacitation due to abnormal movements: None, normal Patient's awareness of abnormal movements (rate only patient's report): No Awareness, Dental Status Current problems with teeth and/or dentures?: No Does patient usually wear dentures?: No  CIWA:    COWS:     Musculoskeletal: Strength & Muscle Tone: within normal limits Gait & Station: normal Patient leans: N/A  Psychiatric Specialty Exam: Physical Exam  Nursing note and vitals reviewed. Constitutional: She appears well-developed and well-nourished.  HENT:  Head: Normocephalic and atraumatic.  Eyes: Pupils are equal, round, and reactive to light. Conjunctivae are normal.  Neck: Normal range of motion.  Cardiovascular: Regular rhythm and normal heart sounds.  Respiratory: Effort normal. No respiratory distress.  GI:  Soft.  Musculoskeletal: Normal range of motion.  Neurological: She is alert.  Skin: Skin is warm and dry.  Psychiatric: Her speech is normal and behavior is normal. Judgment and thought content normal. Cognition and memory are normal. She exhibits a depressed mood.    Review of Systems  Constitutional: Negative.   HENT: Negative.   Eyes: Negative.   Respiratory: Negative.   Cardiovascular: Negative.   Gastrointestinal: Negative.   Musculoskeletal: Negative.   Skin: Negative.   Neurological: Negative.   Psychiatric/Behavioral: Positive for depression. The patient is nervous/anxious.     Blood pressure (!) 86/50, pulse 77, temperature 98.1 F (36.7 C), resp. rate 15, height 5\' 5"  (1.651 m), weight 85.3 kg, SpO2 94 %.Body mass index is 31.28 kg/m.  General Appearance: Casual  Eye Contact:  Good  Speech:  Clear and Coherent  Volume:  Decreased  Mood:  Dysphoric  Affect:  Constricted  Thought Process:  Coherent  Orientation:  Full (Time, Place, and Person)  Thought Content:  Logical  Suicidal Thoughts:  No  Homicidal Thoughts:  No  Memory:  Immediate;   Fair Recent;   Fair Remote;   Fair  Judgement:  Fair  Insight:  Fair  Psychomotor Activity:  Decreased  Concentration:  Concentration: Fair  Recall:  [employer] of Knowledge:  Fair  Language:  Fair  Akathisia:  No  Handed:  Right  AIMS (if indicated):     Assets:  Desire for Improvement Housing Physical Health Resilience Social Support  ADL's:  Intact  Cognition:  WNL  Sleep:  Number of Hours: 5.45     Treatment Plan Summary: Daily contact with patient to assess and evaluate symptoms and progress in treatment, Medication management and Plan ECT today and then we are going to do it on an ad hoc  basis because of her history of bad side effects.  At least this evening she seems to be lucid with only a little bit of soreness.  Anticipate another ECT treatment on Wednesday no change to medicine for now.  Supportive  counseling and therapy and review of plan.  Alethia Berthold, MD 11/20/2017, 7:34 PM

## 2017-11-20 NOTE — [facility] Group Notes (Signed)
[facility] Group Notes:  (Nursing/MHT/Case Management/Adjunct)  Date:  11/20/2017  Time:  10:06 PM  Type of Therapy:  Group Therapy  Participation Level:  Active  Participation Quality:  Appropriate  Affect:  Appropriate  Cognitive:  Alert  Insight:  Good  Engagement in Group:  Engaged  Modes of Intervention:  Support  Summary of Progress/Problems:  Crystal Duarte 11/20/2017, 10:06 PM

## 2017-11-20 NOTE — Procedures (Signed)
[facility] Physician's Interval Evaluation & Treatment Note  Patient Identification: Arelyn Gauer MRN:  [id number] Date of Evaluation:  11/20/2017 TX #: 1  MADRS:   MMSE: 30  P.E. Findings:  No acute change to physical exam.  Nothing remarkable.  Vitals unremarkable heart and lungs normal.  Psychiatric Interval Note:  Depressed mood chronic with worsening depression and anxiety  Subjective:  Patient is a 57 y.o. female seen for evaluation for Electroconvulsive Therapy. Depression with passive suicidal ideation  Treatment Summary:   [x]   Right Unilateral             []  Bilateral   % Energy : 0.3 ms 60%   Impedance: 1840 ohms  Seizure Energy Index: 14,911 V squared  Postictal Suppression Index: 51%  Seizure Concordance Index: 97%  Medications  Pre Shock: Brevital 80 mg succinylcholine 100 mg  Post Shock:    Seizure Duration: 26 seconds EMG 51 seconds EEG   Comments: Follow-up Wednesday if she tolerated this treatment adequately.  Lungs:  [x]   Clear to auscultation               []  Other:   Heart:    [x]   Regular rhythm             []  irregular rhythm    [x]   Previous H&P reviewed, patient examined and there are NO CHANGES                 []   Previous H&P reviewed, patient examined and there are changes noted.   Alethia Berthold, MD 8/19/201911:30 AM

## 2017-11-20 NOTE — H&P (Signed)
Crystal Duarte is an 57 y.o. female.   Chief Complaint: Patient with major depression transferred to [place] for ECT.  Continues to have depressed mood no specific physical symptoms HPI: History of recurrent depression that has had good response to ECT in the past  Past Medical History:  Diagnosis Date  . Coccidioidomycosis, pulmonary ([place])   . Depression   . Hx of adenomatous polyp of colon 12/04/2014  . Osteopenia 11/2016   T score -2.2 FRAX 17%/2.4%  . Panic attack     Past Surgical History:  Procedure Laterality Date  . ABDOMINAL HYSTERECTOMY    . COLONOSCOPY  2001   hemorrhoidectomy  . LUNG SURGERY  2001   VATS surgery   . OVARIAN CYST REMOVAL  1970's  . VESICOVAGINAL FISTULA CLOSURE W/ TAH  2005    Family History  Problem Relation Age of Onset  . Asthma Mother   . Ovarian cancer Mother   . Cancer Mother        OVARIAN  . Varicose Veins Mother   . Heart disease Father   . Peripheral vascular disease Father   . Cancer Maternal Grandmother        LUNG- LUNG  . Colon cancer Neg Hx    Social History:  reports that she has been smoking cigarettes. She has a 42.00 pack-year smoking history. She quit smokeless tobacco use about 17 months ago. She reports that she does not drink alcohol or use drugs.  Allergies:  Allergies  Allergen Reactions  . Latuda [Lurasidone Hcl] Anxiety  . Sulfa Antibiotics Rash    Only in sunlight     Medications Prior to Admission  Medication Sig Dispense Refill  . acidophilus (RISAQUAD) CAPS capsule Take 1 capsule by mouth daily. 1 capsule 0  . busPIRone (BUSPAR) 15 MG tablet Take 1 tablet (15 mg total) by mouth 3 (three) times daily. 1 tablet 0  . capsaicin (ZOSTRIX) 0.025 % cream Apply topically 2 (two) times daily. Affected area 60 g 0  . diclofenac sodium (VOLTAREN) 1 % GEL Apply 2 g topically 4 (four) times daily. 1 Tube 0  . gabapentin (NEURONTIN) 400 MG capsule Take 1 capsule (400 mg total) by mouth 3 (three) times daily. 1 capsule 0   . hydrOXYzine (ATARAX/VISTARIL) 25 MG tablet Take 1 tablet (25 mg total) by mouth 3 (three) times daily as needed for anxiety. 1 tablet 0  . liothyronine (CYTOMEL) 25 MCG tablet Take 0.5 tablets (12.5 mcg total) by mouth daily. 1 tablet 0  . nabumetone (RELAFEN) 500 MG tablet Take 1 tablet (500 mg total) by mouth 2 (two) times daily. 1 tablet 0  . propranolol (INDERAL) 20 MG tablet Take 1 tablet (20 mg total) by mouth 3 (three) times daily. 1 tablet 0  . traZODone (DESYREL) 100 MG tablet Take 2 tablets (200 mg total) by mouth at bedtime as needed for sleep. 1 tablet 0  . vortioxetine HBr (TRINTELLIX) 5 MG TABS tablet Take 3 tablets (15 mg total) by mouth daily. 1 tablet 0    Results for orders placed or performed during the hospital encounter of 11/16/17 (from the past 48 hour(s))  Glucose, capillary     Status: Abnormal   Collection Time: 11/20/17  7:06 AM  Result Value Ref Range   Glucose-Capillary 102 (H) 70 - 99 mg/dL   No results found.  Review of Systems  Constitutional: Negative.   HENT: Negative.   Eyes: Negative.   Respiratory: Negative.   Cardiovascular: Negative.  Gastrointestinal: Negative.   Musculoskeletal: Negative.   Skin: Negative.   Neurological: Negative.   Psychiatric/Behavioral: Positive for depression and suicidal ideas. Negative for hallucinations, memory loss and substance abuse. The patient is nervous/anxious. The patient does not have insomnia.     Blood pressure 130/85, pulse 80, temperature 97.8 F (36.6 C), temperature source Oral, resp. rate 16, height 5\' 5"  (1.651 m), weight 85.3 kg, SpO2 98 %. Physical Exam  Nursing note and vitals reviewed. Constitutional: She appears well-developed and well-nourished.  HENT:  Head: Normocephalic and atraumatic.  Eyes: Pupils are equal, round, and reactive to light. Conjunctivae are normal.  Neck: Normal range of motion.  Cardiovascular: Regular rhythm and normal heart sounds.  Respiratory: Effort normal.   GI: Soft.  Musculoskeletal: Normal range of motion.  Neurological: She is alert.  Skin: Skin is warm and dry.  Psychiatric: Judgment normal. Her affect is blunt. Her speech is delayed. She is slowed. Cognition and memory are normal. She expresses no suicidal ideation.     Assessment/Plan ECT today right unilateral going forward one by one treatments because of her history of side effects  Alethia Berthold, MD 11/20/2017, 11:29 AM

## 2017-11-20 NOTE — Progress Notes (Signed)
Recreation Therapy Notes  Date: 11/20/2017  Time: 9:30 am   Location: Craft Room   Behavioral response: N/A   Intervention Topic: Stress  Discussion/Intervention: Patient did not attend group.   Clinical Observations/Feedback:  Patient did not attend group.   Meagan Spease LRT/CTRS        Ryli Standlee 11/20/2017 11:46 AM

## 2017-11-21 ENCOUNTER — Other Ambulatory Visit: Payer: Self-pay | Admitting: Psychiatry

## 2017-11-21 MED ORDER — IBUPROFEN 200 MG PO TABS
800.0000 mg | ORAL_TABLET | Freq: Three times a day (TID) | ORAL | Status: DC | PRN
  Administered 2017-11-21: 800 mg via ORAL
  Filled 2017-11-21: qty 1

## 2017-11-21 MED ORDER — IBUPROFEN 200 MG PO TABS
400.0000 mg | ORAL_TABLET | Freq: Four times a day (QID) | ORAL | Status: DC | PRN
  Administered 2017-11-21 (×2): 400 mg via ORAL
  Filled 2017-11-21 (×3): qty 2

## 2017-11-21 NOTE — Plan of Care (Signed)
D:Patient is very pleasant and cooperative during assessment. Patient denies SI/HI/AVH. Patient has complaints of muscle soreness to bilateral legs, back and neck. Patient also has complaints of minor anxiety related to the pain.  A:Patient is without any injury and safety is maintained this shift. Patient is provided with scheduled medications. R: patient is cooperative, compliant and seen in milieu participating with peers.    Problem: Education: Goal: Knowledge of the prescribed therapeutic regimen will improve Outcome: Progressing   Problem: Coping: Goal: Will verbalize feelings Outcome: Progressing   Problem: Self-Concept: Goal: Ability to disclose and discuss suicidal ideas will improve Outcome: Progressing   Problem: Safety: Goal: Periods of time without injury will increase Outcome: Progressing

## 2017-11-21 NOTE — Plan of Care (Signed)
Pleasant on approach, mood and affect hopeful, optimistic, "I had ECT with Dr. Weber Cooks, he answered all my questions and I am feeling better already... I talked to Elta [place] and one of my grand kids; everyone is fine..." Denied pain, denied SI/HI/AVH. Trazodone 200 mg and Vistaril 25 mg given as needed @ bedtime.   Patient slept for Estimated Hours of 7.50; Precautionary checks every 15 minutes for safety maintained, room free of safety hazards, patient sustains no injury or falls during this shift.  Problem: Coping: Goal: Will verbalize feelings Outcome: Progressing   Problem: Health Behavior/Discharge Planning: Goal: Compliance with therapeutic regimen will improve Outcome: Progressing   Problem: Role Relationship: Goal: Will demonstrate positive changes in social behaviors and relationships Outcome: Progressing   Problem: Coping: Goal: Coping ability will improve Outcome: Progressing   Problem: Self-Concept: Goal: Ability to disclose and discuss suicidal ideas will improve Outcome: Progressing   Problem: Activity: Goal: Interest or engagement in activities will improve Outcome: Progressing   Problem: Safety: Goal: Periods of time without injury will increase Outcome: Progressing   Problem: Safety: Goal: Ability to remain free from injury will improve Outcome: Progressing

## 2017-11-21 NOTE — Progress Notes (Signed)
Recreation Therapy Notes   Date: 11/21/2017  Time: 9:30 am   Location: Craft Room   Behavioral response: N/A   Intervention Topic: Self-Care  Discussion/Intervention: Patient did not attend group.   Clinical Observations/Feedback:  Patient did not attend group.   Adriel Kessen LRT/CTRS        Merrin Mcvicker 11/21/2017 10:49 AM

## 2017-11-21 NOTE — [facility] Group Notes (Signed)
11/21/2017 1PM  Type of Therapy/Topic:  Group Therapy:  Feelings about Diagnosis  Participation Level:  Did Not Attend   Description of Group:   This group will allow patients to explore their thoughts and feelings about diagnoses they have received. Patients will be guided to explore their level of understanding and acceptance of these diagnoses. Facilitator will encourage patients to process their thoughts and feelings about the reactions of others to their diagnosis and will guide patients in identifying ways to discuss their diagnosis with significant others in their lives. This group will be process-oriented, with patients participating in exploration of their own experiences, giving and receiving support, and processing challenge from other group members.   Therapeutic Goals: 1. Patient will demonstrate understanding of diagnosis as evidenced by identifying two or more symptoms of the disorder 2. Patient will be able to express two feelings regarding the diagnosis 3. Patient will demonstrate their ability to communicate their needs through discussion and/or role play  Summary of Patient Progress: Patient was encouraged and invited to attend group. Patient did not attend group. Social worker will continue to encourage group participation in the future.        Therapeutic Modalities:   Cognitive Behavioral Therapy Brief Therapy Feelings Identification    Darin Engels, Marlinda Mike 11/21/2017 2:33 PM

## 2017-11-21 NOTE — Progress Notes (Signed)
[facility] MD Progress Note  11/21/2017 5:15 PM Crystal Duarte  MRN:  [id number] Subjective: Follow-up for this patient with major depression receiving ECT.  Patient was complaining of some muscle aches and pains today.  Mood is about the same.  Depressed but not actively threatening suicide.  No psychotic symptoms.  Not aware at this point of any memory changes. Principal Problem: Severe recurrent major depression without psychotic features ([place]) Diagnosis:   Patient Active Problem List   Diagnosis Date Noted  . Suicidal ideation [R45.851] 11/17/2017  . Severe recurrent major depression without psychotic features ([place]) [F33.2] 11/16/2017  . Severe episode of recurrent major depressive disorder, without psychotic features ([place]) [F33.2] 07/18/2017  . DOE (dyspnea on exertion) [R06.09] 12/26/2016  . COPD GOLD II  [J44.9] 12/26/2016  . Orthostatic hypotension [I95.1] 12/14/2016  . Chest tightness [R07.89] 12/14/2016  . Weight loss [R63.4] 12/14/2016  . Routine general medical examination at a health care facility [Z00.00] 10/09/2015  . 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 Hx of adenomatous polyp of colon [Z86.010] 12/04/2014  . Memory loss [R41.3] 10/04/2014  . Affective bipolar disorder ([place]) [F31.9] 03/12/2014  . Peripheral vascular disease, unspecified ([place]) [I73.9] 10/10/2013  . Smoker [F17.200] 08/28/2012  . Menopause [Z78.0] 08/28/2012  . Multiple pulmonary nodules [R91.8] 11/12/2010   Total Time spent with patient: 30 minutes  Past Psychiatric History: History of recurrent severe depression with response to ECT  Past Medical History:  Past Medical History:  Diagnosis Date  . Coccidioidomycosis, pulmonary ([place])   . Depression   . Hx of adenomatous polyp of colon 12/04/2014  . Osteopenia 11/2016   T score -2.2 FRAX 17%/2.4%  . Panic attack     Past Surgical History:  Procedure Laterality Date  . ABDOMINAL HYSTERECTOMY    . COLONOSCOPY  2001   hemorrhoidectomy  .  LUNG SURGERY  2001   VATS surgery   . OVARIAN CYST REMOVAL  1970's  . VESICOVAGINAL FISTULA CLOSURE W/ TAH  2005   Family History:  Family History  Problem Relation Age of Onset  . Asthma Mother   . Ovarian cancer Mother   . Cancer Mother        OVARIAN  . Varicose Veins Mother   . Heart disease Father   . Peripheral vascular disease Father   . Cancer Maternal Grandmother        LUNG- LUNG  . Colon cancer Neg Hx    Family Psychiatric  History: See previous notes Social History:  Social History   Substance and Sexual Activity  Alcohol Use No  . Alcohol/week: 0.0 standard drinks     Social History   Substance and Sexual Activity  Drug Use No    Social History   Socioeconomic History  . Marital status: Divorced    Spouse name: Not on file  . Number of children: 2  . Years of education: Not on file  . Highest education level: Not on file  Occupational History  . Occupation: Forensic psychologist: [employer]  . Financial resource strain: Not on file  . Food insecurity:    Worry: Not on file    Inability: Not on file  . Transportation needs:    Medical: Not on file    Non-medical: Not on file  Tobacco Use  . Smoking status: Current Some Day Smoker    Packs/day: 1.00    Years: 42.00    Pack years: 42.00    Types:  Cigarettes    Last attempt to quit: 09/25/2016    Years since quitting: 1.1  . Smokeless tobacco: Former Systems developer    Quit date: 06/13/2016  Substance and Sexual Activity  . Alcohol use: No    Alcohol/week: 0.0 standard drinks  . Drug use: No  . Sexual activity: Not on file  Lifestyle  . Physical activity:    Days per week: Not on file    Minutes per session: Not on file  . Stress: Not on file  Relationships  . Social connections:    Talks on phone: Not on file    Gets together: Not on file    Attends religious service: Not on file    Active member of club or organization: Not on file    Attends meetings of clubs or  organizations: Not on file    Relationship status: Not on file  Other Topics Concern  . Not on file  Social History Narrative  . Not on file   Additional Social History:                         Sleep: Good  Appetite:  Fair  Current Medications: Current Facility-Administered Medications  Medication Dose Route Frequency Provider Last Rate Last Dose  . acetaminophen (TYLENOL) tablet 650 mg  650 mg Oral Q6H PRN Clapacs, Madie Reno, MD   650 mg at 11/20/17 1418  . acidophilus (RISAQUAD) capsule 1 capsule  1 capsule Oral Daily Clapacs, Madie Reno, MD   1 capsule at 11/21/17 0741  . alum & mag hydroxide-simeth (MAALOX/MYLANTA) 200-200-20 MG/5ML suspension 30 mL  30 mL Oral Q4H PRN Clapacs, John T, MD      . busPIRone (BUSPAR) tablet 15 mg  15 mg Oral TID Clapacs, Madie Reno, MD   15 mg at 11/21/17 1600  . capsaicin (ZOSTRIX) 0.025 % cream   Topical BID Clapacs, John T, MD      . diclofenac sodium (VOLTAREN) 1 % transdermal gel 2 g  2 g Topical QID Clapacs, Madie Reno, MD   2 g at 11/17/17 2138  . gabapentin (NEURONTIN) capsule 400 mg  400 mg Oral TID Clapacs, John T, MD   400 mg at 11/21/17 1600  . hydrOXYzine (ATARAX/VISTARIL) tablet 25 mg  25 mg Oral TID PRN Clapacs, Madie Reno, MD   25 mg at 11/21/17 1246  . ibuprofen (ADVIL,MOTRIN) tablet 400 mg  400 mg Oral Q6H PRN Clapacs, Madie Reno, MD   400 mg at 11/21/17 1600  . liothyronine (CYTOMEL) tablet 12.5 mcg  12.5 mcg Oral Daily Clapacs, Madie Reno, MD   12.5 mcg at 11/21/17 0741  . magnesium hydroxide (MILK OF MAGNESIA) suspension 30 mL  30 mL Oral Daily PRN Clapacs, John T, MD      . nabumetone (RELAFEN) tablet 500 mg  500 mg Oral BID Clapacs, John T, MD   500 mg at 11/21/17 1600  . propranolol (INDERAL) tablet 20 mg  20 mg Oral TID Clapacs, Madie Reno, MD   20 mg at 11/21/17 1600  . traZODone (DESYREL) tablet 200 mg  200 mg Oral QHS PRN Clapacs, Madie Reno, MD   200 mg at 11/20/17 2139  . vortioxetine HBr (TRINTELLIX) tablet 15 mg  15 mg Oral Daily Clapacs, Madie Reno, MD   15 mg at 11/21/17 1245    Lab Results:  Results for orders placed or performed during the hospital encounter of 11/16/17 (from the past 48 hour(s))  Glucose, capillary     Status: Abnormal   Collection Time: 11/20/17  7:06 AM  Result Value Ref Range   Glucose-Capillary 102 (H) 70 - 99 mg/dL    Blood Alcohol level:  Lab Results  Component Value Date   ETH <10 11/02/2017   ETH <10 34/74/2595    Metabolic Disorder Labs: Lab Results  Component Value Date   HGBA1C 5.7 (H) 11/18/2017   MPG 116.89 11/18/2017   MPG 108.28 07/21/2017   No results found for: PROLACTIN Lab Results  Component Value Date   CHOL 232 (H) 11/18/2017   TRIG 230 (H) 11/18/2017   HDL 49 11/18/2017   CHOLHDL 4.7 11/18/2017   VLDL 46 (H) 11/18/2017   LDLCALC 137 (H) 11/18/2017   LDLCALC 104 (H) 07/21/2017    Physical Findings: AIMS: Facial and Oral Movements Muscles of Facial Expression: None, normal Lips and Perioral Area: None, normal Jaw: None, normal Tongue: None, normal,Extremity Movements Upper (arms, wrists, hands, fingers): None, normal Lower (legs, knees, ankles, toes): None, normal, Trunk Movements Neck, shoulders, hips: None, normal, Overall Severity Severity of abnormal movements (highest score from questions above): None, normal Incapacitation due to abnormal movements: None, normal Patient's awareness of abnormal movements (rate only patient's report): No Awareness, Dental Status Current problems with teeth and/or dentures?: No Does patient usually wear dentures?: No  CIWA:    COWS:     Musculoskeletal: Strength & Muscle Tone: within normal limits Gait & Station: normal Patient leans: N/A  Psychiatric Specialty Exam: Physical Exam  Nursing note and vitals reviewed. Constitutional: She appears well-developed and well-nourished.  HENT:  Head: Normocephalic and atraumatic.  Eyes: Pupils are equal, round, and reactive to light. Conjunctivae are normal.  Neck: Normal  range of motion.  Cardiovascular: Regular rhythm and normal heart sounds.  Respiratory: Effort normal. No respiratory distress.  GI: Soft.  Musculoskeletal: Normal range of motion.  Neurological: She is alert.  Skin: Skin is warm and dry.  Psychiatric: Judgment normal. Her speech is delayed. She is slowed. Thought content is not paranoid. Cognition and memory are normal. She exhibits a depressed mood. She expresses no homicidal and no suicidal ideation.    Review of Systems  Constitutional: Negative.   HENT: Negative.   Eyes: Negative.   Respiratory: Negative.   Cardiovascular: Negative.   Gastrointestinal: Negative.   Musculoskeletal: Positive for myalgias.  Skin: Negative.   Neurological: Negative.   Psychiatric/Behavioral: Positive for depression. Negative for hallucinations, memory loss, substance abuse and suicidal ideas. The patient is not nervous/anxious and does not have insomnia.     Blood pressure (!) 125/93, pulse 77, temperature 97.9 F (36.6 C), temperature source Oral, resp. rate 18, height 5\' 5"  (1.651 m), weight 85.3 kg, SpO2 98 %.Body mass index is 31.28 kg/m.  General Appearance: Casual  Eye Contact:  Good  Speech:  Clear and Coherent  Volume:  Normal  Mood:  Euthymic  Affect:  Congruent  Thought Process:  Goal Directed  Orientation:  Full (Time, Place, and Person)  Thought Content:  Logical  Suicidal Thoughts:  No  Homicidal Thoughts:  No  Memory:  Immediate;   Fair Recent;   Fair Remote;   Fair  Judgement:  Fair  Insight:  Fair  Psychomotor Activity:  Decreased  Concentration:  Concentration: Fair  Recall:  [employer] of Knowledge:  Fair  Language:  Fair  Akathisia:  No  Handed:  Right  AIMS (if indicated):     Assets:  Desire for Improvement Housing  Physical Health  ADL's:  Intact  Cognition:  WNL  Sleep:  Number of Hours: 7.5     Treatment Plan Summary: Daily contact with patient to assess and evaluate symptoms and progress in treatment,  Medication management and Plan Patient tolerating ECT.  Next treatment scheduled for tomorrow.  No change to medicine today other than the addition of Motrin for aches and pains.  Psychoeducation completed.  Discussed overall treatment plan with possibility of discharge later this week.  Alethia Berthold, MD 11/21/2017, 5:15 PM

## 2017-11-22 ENCOUNTER — Inpatient Hospital Stay: Payer: Medicare HMO | Admitting: Anesthesiology

## 2017-11-22 ENCOUNTER — Inpatient Hospital Stay
Admission: AD | Admit: 2017-11-22 | Discharge: 2017-11-22 | Disposition: A | Discharge status: Home / Self Care | Payer: Medicare HMO | Source: Intra-hospital | Attending: Psychiatry | Admitting: Psychiatry

## 2017-11-22 LAB — GLUCOSE, CAPILLARY: Glucose-Capillary: 96 mg/dL (ref 70–99)

## 2017-11-22 MED ORDER — METHOHEXITAL SODIUM 0.5 G IJ SOLR
INTRAMUSCULAR | Status: AC
  Filled 2017-11-22: qty 500

## 2017-11-22 MED ORDER — SUCCINYLCHOLINE CHLORIDE 20 MG/ML IJ SOLN
INTRAMUSCULAR | Status: AC
  Filled 2017-11-22: qty 1

## 2017-11-22 MED ORDER — KETOROLAC TROMETHAMINE 30 MG/ML IJ SOLN
INTRAMUSCULAR | Status: AC
  Filled 2017-11-22: qty 1

## 2017-11-22 MED ORDER — SODIUM CHLORIDE 0.9 % IV SOLN
500.0000 mL | Freq: Once | INTRAVENOUS | Status: AC
  Administered 2017-11-22: 09:00:00 via INTRAVENOUS

## 2017-11-22 MED ORDER — KETOROLAC TROMETHAMINE 30 MG/ML IJ SOLN: 30.0000 mg | Freq: Once | INTRAMUSCULAR | Status: DC

## 2017-11-22 MED ORDER — GLYCOPYRROLATE 0.2 MG/ML IJ SOLN: 0.1000 mg | Freq: Once | INTRAMUSCULAR | Status: DC

## 2017-11-22 MED ORDER — GLYCOPYRROLATE 0.2 MG/ML IJ SOLN
INTRAMUSCULAR | Status: AC
  Filled 2017-11-22: qty 1

## 2017-11-22 NOTE — Tx Team (Signed)
Interdisciplinary Treatment and Diagnostic Plan Update  11/22/2017 Time of Session: 0900 Crystal Duarte MRN: [id number]  Principal Diagnosis: Severe recurrent major depression without psychotic features [facility])  Secondary Diagnoses: Principal Problem:   Severe recurrent major depression without psychotic features ([place]) Active Problems:   Severe episode of recurrent major depressive disorder, without psychotic features ([place])   Suicidal ideation   Current Medications:  Current Facility-Administered Medications  Medication Dose Route Frequency Provider Last Rate Last Dose  . acetaminophen (TYLENOL) tablet 650 mg  650 mg Oral Q6H PRN Clapacs, Madie Reno, MD   650 mg at 11/20/17 1418  . acidophilus (RISAQUAD) capsule 1 capsule  1 capsule Oral Daily Clapacs, Madie Reno, MD   1 capsule at 11/22/17 1137  . alum & mag hydroxide-simeth (MAALOX/MYLANTA) 200-200-20 MG/5ML suspension 30 mL  30 mL Oral Q4H PRN Clapacs, John T, MD      . busPIRone (BUSPAR) tablet 15 mg  15 mg Oral TID Clapacs, John T, MD   15 mg at 11/22/17 1137  . capsaicin (ZOSTRIX) 0.025 % cream   Topical BID Clapacs, John T, MD      . diclofenac sodium (VOLTAREN) 1 % transdermal gel 2 g  2 g Topical QID Clapacs, Madie Reno, MD   2 g at 11/17/17 2138  . gabapentin (NEURONTIN) capsule 400 mg  400 mg Oral TID Clapacs, John T, MD   400 mg at 11/22/17 1137  . hydrOXYzine (ATARAX/VISTARIL) tablet 25 mg  25 mg Oral TID PRN Clapacs, John T, MD   25 mg at 11/21/17 2113  . ibuprofen (ADVIL,MOTRIN) tablet 800 mg  800 mg Oral Q8H PRN Clapacs, Madie Reno, MD   800 mg at 11/21/17 2114  . liothyronine (CYTOMEL) tablet 12.5 mcg  12.5 mcg Oral Daily Clapacs, Madie Reno, MD   12.5 mcg at 11/22/17 1136  . magnesium hydroxide (MILK OF MAGNESIA) suspension 30 mL  30 mL Oral Daily PRN Clapacs, John T, MD      . nabumetone (RELAFEN) tablet 500 mg  500 mg Oral BID Clapacs, Madie Reno, MD   500 mg at 11/22/17 1137  . propranolol (INDERAL) tablet 20 mg  20 mg Oral TID Clapacs,  Madie Reno, MD   20 mg at 11/22/17 1135  . traZODone (DESYREL) tablet 200 mg  200 mg Oral QHS PRN Clapacs, John T, MD   200 mg at 11/21/17 2113  . vortioxetine HBr (TRINTELLIX) tablet 15 mg  15 mg Oral Daily Clapacs, Madie Reno, MD   Stopped at 11/22/17 1700   Facility-Administered Medications Ordered in Other Encounters  Medication Dose Route Frequency Provider Last Rate Last Dose  . glycopyrrolate (ROBINUL) injection 0.1 mg  0.1 mg Intravenous Once Clapacs, John T, MD      . ketorolac (TORADOL) 30 MG/ML injection 30 mg  30 mg Intravenous Once Clapacs, Madie Reno, MD       PTA Medications: Medications Prior to Admission  Medication Sig Dispense Refill Last Dose  . acidophilus (RISAQUAD) CAPS capsule Take 1 capsule by mouth daily. 1 capsule 0 11/19/2017  . busPIRone (BUSPAR) 15 MG tablet Take 1 tablet (15 mg total) by mouth 3 (three) times daily. 1 tablet 0 11/19/2017  . capsaicin (ZOSTRIX) 0.025 % cream Apply topically 2 (two) times daily. Affected area 60 g 0 11/19/2017  . diclofenac sodium (VOLTAREN) 1 % GEL Apply 2 g topically 4 (four) times daily. 1 Tube 0 11/19/2017  . gabapentin (NEURONTIN) 400 MG capsule Take 1 capsule (400 mg total)  by mouth 3 (three) times daily. 1 capsule 0 11/19/2017  . hydrOXYzine (ATARAX/VISTARIL) 25 MG tablet Take 1 tablet (25 mg total) by mouth 3 (three) times daily as needed for anxiety. 1 tablet 0 11/19/2017  . liothyronine (CYTOMEL) 25 MCG tablet Take 0.5 tablets (12.5 mcg total) by mouth daily. 1 tablet 0 11/19/2017  . nabumetone (RELAFEN) 500 MG tablet Take 1 tablet (500 mg total) by mouth 2 (two) times daily. 1 tablet 0 11/19/2017  . propranolol (INDERAL) 20 MG tablet Take 1 tablet (20 mg total) by mouth 3 (three) times daily. 1 tablet 0 11/19/2017  . traZODone (DESYREL) 100 MG tablet Take 2 tablets (200 mg total) by mouth at bedtime as needed for sleep. 1 tablet 0 11/19/2017  . vortioxetine HBr (TRINTELLIX) 5 MG TABS tablet Take 3 tablets (15 mg total) by mouth daily. 1 tablet  0 11/19/2017    Patient Stressors: Medication change or noncompliance Traumatic event Other: AntiDepressant Resistant now to Explore ECT  Patient Strengths: Ability for insight Average or above average intelligence Capable of independent living Motivation for treatment/growth Supportive family/friends  Treatment Modalities: Medication Management, Group therapy, Case management,  1 to 1 session with clinician, Psychoeducation, Recreational therapy.   Physician Treatment Plan for Primary Diagnosis: Severe recurrent major depression without psychotic features ([place]) Long Term Goal(s): Improvement in symptoms so as ready for discharge Improvement in symptoms so as ready for discharge   Short Term Goals: Ability to verbalize feelings will improve Ability to disclose and discuss suicidal ideas Ability to maintain clinical measurements within normal limits will improve Compliance with prescribed medications will improve  Medication Management: Evaluate patient's response, side effects, and tolerance of medication regimen.  Therapeutic Interventions: 1 to 1 sessions, Unit Group sessions and Medication administration.  Evaluation of Outcomes: Progressing  Physician Treatment Plan for Secondary Diagnosis: Principal Problem:   Severe recurrent major depression without psychotic features ([place]) Active Problems:   Severe episode of recurrent major depressive disorder, without psychotic features ([place])   Suicidal ideation  Long Term Goal(s): Improvement in symptoms so as ready for discharge Improvement in symptoms so as ready for discharge   Short Term Goals: Ability to verbalize feelings will improve Ability to disclose and discuss suicidal ideas Ability to maintain clinical measurements within normal limits will improve Compliance with prescribed medications will improve     Medication Management: Evaluate patient's response, side effects, and tolerance of medication  regimen.  Therapeutic Interventions: 1 to 1 sessions, Unit Group sessions and Medication administration.  Evaluation of Outcomes: Progressing   RN Treatment Plan for Primary Diagnosis: Severe recurrent major depression without psychotic features ([place]) Long Term Goal(s): Knowledge of disease and therapeutic regimen to maintain health will improve  Short Term Goals: Ability to participate in decision making will improve, Ability to identify and develop effective coping behaviors will improve and Compliance with prescribed medications will improve  Medication Management: RN will administer medications as ordered by provider, will assess and evaluate patient's response and provide education to patient for prescribed medication. RN will report any adverse and/or side effects to prescribing provider.  Therapeutic Interventions: 1 on 1 counseling sessions, Psychoeducation, Medication administration, Evaluate responses to treatment, Monitor vital signs and CBGs as ordered, Perform/monitor CIWA, COWS, AIMS and Fall Risk screenings as ordered, Perform wound care treatments as ordered.  Evaluation of Outcomes: Progressing   LCSW Treatment Plan for Primary Diagnosis: Severe recurrent major depression without psychotic features ([place]) Long Term Goal(s): Safe transition to appropriate next level of care  at discharge, Engage patient in therapeutic group addressing interpersonal concerns.  Short Term Goals: Engage patient in aftercare planning with referrals and resources, Increase ability to appropriately verbalize feelings and Increase skills for wellness and recovery  Therapeutic Interventions: Assess for all discharge needs, 1 to 1 time with Social worker, Explore available resources and support systems, Assess for adequacy in community support network, Educate family and significant other(s) on suicide prevention, Complete Psychosocial Assessment, Interpersonal group therapy.  Evaluation of Outcomes:  Progressing   Progress in Treatment: Attending groups: No. Participating in groups: No. Taking medication as prescribed: Yes. Toleration medication: Yes. Family/Significant other contact made: Yes, individual(s) contacted:  Patients Son Patient understands diagnosis: Yes. Discussing patient identified problems/goals with staff: Yes. Medical problems stabilized or resolved: Yes. Denies suicidal/homicidal ideation: Yes. Issues/concerns per patient self-inventory: No. Other: None at this time.   New problem(s) identified: No, Describe:  None at this time.  New Short Term/Long Term Goal(s): Pt will demonstrate an improved mood by at least 48 hours prior to discharge.   Patient Goals:  Pt reported her goal for treatment is, "to get ECT."   Discharge Plan or Barriers: Pt will continue ECT in the outpatient setting upon discharge.   Reason for Continuation of Hospitalization: Depression Medication stabilization  Estimated Length of Stay: 1-5 days  Recreational Therapy: Patient Stressors: N/A Patient Goal: Patient will engage in groups without prompting or encouragement from LRT x3 group sessions within 5 recreation therapy group sessions  Attendees: Patient: 11/22/2017 11:42 AM  Physician: Dr. Weber Cooks 11/22/2017 11:42 AM  Nursing:  11/22/2017 11:42 AM  RN Care Manager: 11/22/2017 11:42 AM  Social Worker: Darin Engels, [place] 11/22/2017 11:42 AM  Recreational Therapist:  11/22/2017 11:42 AM  Other:  11/22/2017 11:42 AM  Other:  11/22/2017 11:42 AM  Other: 11/22/2017 11:42 AM    Scribe for Treatment Team: Darin Engels, LCSW 11/22/2017 11:42 AM

## 2017-11-22 NOTE — Progress Notes (Signed)
Patient stated she did not want to be "stuck" for an IV more than once. She stated that it was very stressful to have an IV every time. Anesthesiology spoke with the patient regarding the need for an IV to administer the medication. IV team was called. After the IV team was contacted the patient stated she refused service, did not want to have an IV and "it is time for my coffee." Patient transferred back to the [place] Unit. Dr. Weber Cooks present during the entire situation.

## 2017-11-22 NOTE — Anesthesia Preprocedure Evaluation (Signed)
Anesthesia Evaluation  Patient identified by MRN, date of birth, ID band Patient awake    Reviewed: Allergy & Precautions, H&P , NPO status , reviewed documented beta blocker date and time   Airway Mallampati: II  TM Distance: >3 FB Neck ROM: full    Dental  (+) Poor Dentition, Chipped   Pulmonary COPD, Current Smoker,    Pulmonary exam normal        Cardiovascular + Peripheral Vascular Disease and + DOE  Normal cardiovascular exam     Neuro/Psych PSYCHIATRIC DISORDERS Anxiety Depression Bipolar Disorder    GI/Hepatic   Endo/Other    Renal/GU      Musculoskeletal   Abdominal   Peds  Hematology   Anesthesia Other Findings Past Medical History: No date: Coccidioidomycosis, pulmonary ([place]) No date: Depression 12/04/2014: Hx of adenomatous polyp of colon 11/2016: Osteopenia     Comment:  T score -2.2 FRAX 17%/2.4% No date: Panic attack  Past Surgical History: No date: ABDOMINAL HYSTERECTOMY 2001: COLONOSCOPY     Comment:  hemorrhoidectomy 2001: LUNG SURGERY     Comment:  VATS surgery  1970's: OVARIAN CYST REMOVAL 2005: VESICOVAGINAL FISTULA CLOSURE W/ TAH     Reproductive/Obstetrics                             Anesthesia Physical Anesthesia Plan  ASA: III  Anesthesia Plan: General   Post-op Pain Management:    Induction: Intravenous  PONV Risk Score and Plan: 3 and Treatment may vary due to age or medical condition and TIVA  Airway Management Planned: Mask  Additional Equipment:   Intra-op Plan:   Post-operative Plan:   Informed Consent: I have reviewed the patients History and Physical, chart, labs and discussed the procedure including the risks, benefits and alternatives for the proposed anesthesia with the patient or authorized representative who has indicated his/her understanding and acceptance.   Dental Advisory Given  Plan Discussed with: CRNA  Anesthesia  Plan Comments:         Anesthesia Quick Evaluation

## 2017-11-22 NOTE — [facility] Group Notes (Signed)
[place] Group Notes:  (Nursing/MHT/Case Management/Adjunct)  Date:  11/22/2017  Time:  9:46 PM  Type of Therapy:  Group Therapy  Participation Level:  Did Not Attend   Barnie Mort 11/22/2017, 9:46 PM

## 2017-11-22 NOTE — Progress Notes (Signed)
[facility] MD Progress Note  11/22/2017 7:32 PM Crystal Duarte  MRN:  [id number] Subjective: Follow-up for this patient with major depression.  Patient was scheduled for ECT today.  During the preparations for ECT there was some difficulty getting her IV line started as has been a problem in the past.  Patient was unwilling to allow the nurses to continue after they missed the first attempt.  We talked about it and she understood that we would not then be able to proceed with ECT.  Patient says that generally she is feeling much better.  Mood is not nearly as sad.  Almost back to baseline.  Denies any suicidal ideation.  No psychotic symptoms no new physical complaints. Principal Problem: Severe recurrent major depression without psychotic features ([place]) Diagnosis:   Patient Active Problem List   Diagnosis Date Noted  . Suicidal ideation [R45.851] 11/17/2017  . Severe recurrent major depression without psychotic features ([place]) [F33.2] 11/16/2017  . Severe episode of recurrent major depressive disorder, without psychotic features ([place]) [F33.2] 07/18/2017  . DOE (dyspnea on exertion) [R06.09] 12/26/2016  . COPD GOLD II  [J44.9] 12/26/2016  . Orthostatic hypotension [I95.1] 12/14/2016  . Chest tightness [R07.89] 12/14/2016  . Weight loss [R63.4] 12/14/2016  . Routine general medical examination at a health care facility [Z00.00] 10/09/2015  . 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 Hx of adenomatous polyp of colon [Z86.010] 12/04/2014  . Memory loss [R41.3] 10/04/2014  . Affective bipolar disorder ([place]) [F31.9] 03/12/2014  . Peripheral vascular disease, unspecified ([place]) [I73.9] 10/10/2013  . Smoker [F17.200] 08/28/2012  . Menopause [Z78.0] 08/28/2012  . Multiple pulmonary nodules [R91.8] 11/12/2010   Total Time spent with patient: 20 minutes  Past Psychiatric History: Long history of depression multiple hospitalizations has had good response to ECT but difficulty with keeping it going  in part because of the trouble with IV access  Past Medical History:  Past Medical History:  Diagnosis Date  . Coccidioidomycosis, pulmonary ([place])   . Depression   . Hx of adenomatous polyp of colon 12/04/2014  . Osteopenia 11/2016   T score -2.2 FRAX 17%/2.4%  . Panic attack     Past Surgical History:  Procedure Laterality Date  . ABDOMINAL HYSTERECTOMY    . COLONOSCOPY  2001   hemorrhoidectomy  . LUNG SURGERY  2001   VATS surgery   . OVARIAN CYST REMOVAL  1970's  . VESICOVAGINAL FISTULA CLOSURE W/ TAH  2005   Family History:  Family History  Problem Relation Age of Onset  . Asthma Mother   . Ovarian cancer Mother   . Cancer Mother        OVARIAN  . Varicose Veins Mother   . Heart disease Father   . Peripheral vascular disease Father   . Cancer Maternal Grandmother        LUNG- LUNG  . Colon cancer Neg Hx    Family Psychiatric  History: See previous note Social History:  Social History   Substance and Sexual Activity  Alcohol Use No  . Alcohol/week: 0.0 standard drinks     Social History   Substance and Sexual Activity  Drug Use No    Social History   Socioeconomic History  . Marital status: Divorced    Spouse name: Not on file  . Number of children: 2  . Years of education: Not on file  . Highest education level: Not on file  Occupational History  . Occupation: Forensic psychologist: [employer]  Social Needs  . Financial resource strain: Not on file  . Food insecurity:    Worry: Not on file    Inability: Not on file  . Transportation needs:    Medical: Not on file    Non-medical: Not on file  Tobacco Use  . Smoking status: Current Some Day Smoker    Packs/day: 1.00    Years: 42.00    Pack years: 42.00    Types: Cigarettes    Last attempt to quit: 09/25/2016    Years since quitting: 1.1  . Smokeless tobacco: Former Systems developer    Quit date: 06/13/2016  Substance and Sexual Activity  . Alcohol use: No    Alcohol/week: 0.0 standard drinks   . Drug use: No  . Sexual activity: Not on file  Lifestyle  . Physical activity:    Days per week: Not on file    Minutes per session: Not on file  . Stress: Not on file  Relationships  . Social connections:    Talks on phone: Not on file    Gets together: Not on file    Attends religious service: Not on file    Active member of club or organization: Not on file    Attends meetings of clubs or organizations: Not on file    Relationship status: Not on file  Other Topics Concern  . Not on file  Social History Narrative  . Not on file   Additional Social History:                         Sleep: Fair  Appetite:  Fair  Current Medications: Current Facility-Administered Medications  Medication Dose Route Frequency Provider Last Rate Last Dose  . acetaminophen (TYLENOL) tablet 650 mg  650 mg Oral Q6H PRN Clapacs, Madie Reno, MD   650 mg at 11/20/17 1418  . acidophilus (RISAQUAD) capsule 1 capsule  1 capsule Oral Daily Clapacs, Madie Reno, MD   1 capsule at 11/22/17 1137  . alum & mag hydroxide-simeth (MAALOX/MYLANTA) 200-200-20 MG/5ML suspension 30 mL  30 mL Oral Q4H PRN Clapacs, John T, MD      . busPIRone (BUSPAR) tablet 15 mg  15 mg Oral TID Clapacs, Madie Reno, MD   15 mg at 11/22/17 1656  . capsaicin (ZOSTRIX) 0.025 % cream   Topical BID Clapacs, John T, MD      . diclofenac sodium (VOLTAREN) 1 % transdermal gel 2 g  2 g Topical QID Clapacs, Madie Reno, MD   2 g at 11/17/17 2138  . gabapentin (NEURONTIN) capsule 400 mg  400 mg Oral TID Clapacs, Madie Reno, MD   400 mg at 11/22/17 1658  . hydrOXYzine (ATARAX/VISTARIL) tablet 25 mg  25 mg Oral TID PRN Clapacs, John T, MD   25 mg at 11/21/17 2113  . ibuprofen (ADVIL,MOTRIN) tablet 800 mg  800 mg Oral Q8H PRN Clapacs, Madie Reno, MD   800 mg at 11/21/17 2114  . liothyronine (CYTOMEL) tablet 12.5 mcg  12.5 mcg Oral Daily Clapacs, Madie Reno, MD   12.5 mcg at 11/22/17 1136  . magnesium hydroxide (MILK OF MAGNESIA) suspension 30 mL  30 mL Oral Daily PRN  Clapacs, John T, MD      . nabumetone (RELAFEN) tablet 500 mg  500 mg Oral BID Clapacs, Madie Reno, MD   500 mg at 11/22/17 1656  . propranolol (INDERAL) tablet 20 mg  20 mg Oral TID Clapacs, Madie Reno, MD  20 mg at 11/22/17 1706  . traZODone (DESYREL) tablet 200 mg  200 mg Oral QHS PRN Clapacs, John T, MD   200 mg at 11/21/17 2113  . vortioxetine HBr (TRINTELLIX) tablet 15 mg  15 mg Oral Daily Clapacs, Madie Reno, MD   15 mg at 11/22/17 1657   Facility-Administered Medications Ordered in Other Encounters  Medication Dose Route Frequency Provider Last Rate Last Dose  . glycopyrrolate (ROBINUL) injection 0.1 mg  0.1 mg Intravenous Once Clapacs, John T, MD      . ketorolac (TORADOL) 30 MG/ML injection 30 mg  30 mg Intravenous Once Clapacs, Madie Reno, MD        Lab Results:  Results for orders placed or performed during the hospital encounter of 11/16/17 (from the past 48 hour(s))  Glucose, capillary     Status: None   Collection Time: 11/22/17  6:41 AM  Result Value Ref Range   Glucose-Capillary 96 70 - 99 mg/dL    Blood Alcohol level:  Lab Results  Component Value Date   ETH <10 11/02/2017   ETH <10 63/84/5364    Metabolic Disorder Labs: Lab Results  Component Value Date   HGBA1C 5.7 (H) 11/18/2017   MPG 116.89 11/18/2017   MPG 108.28 07/21/2017   No results found for: PROLACTIN Lab Results  Component Value Date   CHOL 232 (H) 11/18/2017   TRIG 230 (H) 11/18/2017   HDL 49 11/18/2017   CHOLHDL 4.7 11/18/2017   VLDL 46 (H) 11/18/2017   LDLCALC 137 (H) 11/18/2017   LDLCALC 104 (H) 07/21/2017    Physical Findings: AIMS: Facial and Oral Movements Muscles of Facial Expression: None, normal Lips and Perioral Area: None, normal Jaw: None, normal Tongue: None, normal,Extremity Movements Upper (arms, wrists, hands, fingers): None, normal Lower (legs, knees, ankles, toes): None, normal, Trunk Movements Neck, shoulders, hips: None, normal, Overall Severity Severity of abnormal movements  (highest score from questions above): None, normal Incapacitation due to abnormal movements: None, normal Patient's awareness of abnormal movements (rate only patient's report): No Awareness, Dental Status Current problems with teeth and/or dentures?: No Does patient usually wear dentures?: No  CIWA:    COWS:     Musculoskeletal: Strength & Muscle Tone: within normal limits Gait & Station: normal Patient leans: N/A  Psychiatric Specialty Exam: Physical Exam  Nursing note and vitals reviewed. Constitutional: She appears well-developed and well-nourished.  HENT:  Head: Normocephalic and atraumatic.  Eyes: Pupils are equal, round, and reactive to light. Conjunctivae are normal.  Neck: Normal range of motion.  Cardiovascular: Normal heart sounds.  Respiratory: Effort normal.  GI: Soft.  Musculoskeletal: Normal range of motion.  Neurological: She is alert.  Skin: Skin is warm and dry.  Psychiatric: She has a normal mood and affect. Her speech is normal and behavior is normal. Judgment and thought content normal. Cognition and memory are normal.    Review of Systems  Constitutional: Negative.   HENT: Negative.   Eyes: Negative.   Respiratory: Negative.   Cardiovascular: Negative.   Gastrointestinal: Negative.   Musculoskeletal: Negative.   Skin: Negative.   Neurological: Negative.   Psychiatric/Behavioral: Negative.     Blood pressure 117/70, pulse 74, temperature 98.5 F (36.9 C), temperature source Oral, resp. rate 18, height 5\' 5"  (1.651 m), weight 85.3 kg, SpO2 100 %.Body mass index is 31.28 kg/m.  General Appearance: Casual  Eye Contact:  Good  Speech:  Clear and Coherent  Volume:  Normal  Mood:  Euthymic  Affect:  Congruent  Thought Process:  Goal Directed  Orientation:  Full (Time, Place, and Person)  Thought Content:  Logical  Suicidal Thoughts:  No  Homicidal Thoughts:  No  Memory:  Immediate;   Fair Recent;   Fair Remote;   Fair  Judgement:  Fair   Insight:  Fair  Psychomotor Activity:  Normal  Concentration:  Concentration: Fair  Recall:  [employer] of Knowledge:  Fair  Language:  Fair  Akathisia:  No  Handed:  Right  AIMS (if indicated):     Assets:  Desire for Improvement Financial Resources/Insurance Physical Health Resilience Social Support  ADL's:  Intact  Cognition:  WNL  Sleep:  Number of Hours: 6     Treatment Plan Summary: Daily contact with patient to assess and evaluate symptoms and progress in treatment, Medication management and Plan Patient seems to be improving which is consistent with her previous reports of feeling much better with even a short amount of ECT.  Given the difficulty that she had tolerating IV access today it seems unlikely that continued hospitalization just for ECT would be beneficial.  Fortunately she is doing much better.  No longer suicidal or psychotic.  Has outpatient treatment in place.  Tolerating medicine well.  We talked about possible options and the patient is agreeable to a plan for discharge tomorrow.  I spoke to her son as well and he is agreeable to this.  We will tentatively plan for getting her discharge by tomorrow afternoon.  She will follow-up with her provider in [facility].  We talked about the possibility of a PICC line for ECT.  I am not sure how practical that would be given the need for longer term treatment but the patient may look into it on her own.  Alethia Berthold, MD 11/22/2017, 7:32 PM

## 2017-11-22 NOTE — Progress Notes (Signed)
Received Crystal Duarte from the PACU after it was cancelled today. She was compliant with her medications. She continues to endorse depression with passive SI without a plan. She has been OOB at intervals in the milieu. No change in her status this PM.

## 2017-11-22 NOTE — Plan of Care (Addendum)
Visitation by a friend from [place] went well according to patient; pleasant, polite, "the residual effect of ECT did not start until after MN yesterday, I did not feel any pain when I went to bed..." Ibuprofen 800 mg, Trazodone 200 mg and Hydroxyzine 25  mg  PO PRNs prior to bed; verbalized understanding of the anticipating upcoming ECT in am, NPO after MN, and Pre-ECT CBG=96.  Patient slept for Estimated Hours of 6; Precautionary checks every 15 minutes for safety maintained, room free of safety hazards, patient sustains no injury or falls during this shift.  Problem: Education: Goal: Knowledge of the prescribed therapeutic regimen will improve Outcome: Progressing   Problem: Coping: Goal: Will verbalize feelings Outcome: Progressing   Problem: Health Behavior/Discharge Planning: Goal: Compliance with therapeutic regimen will improve Outcome: Progressing   Problem: Education: Goal: Ability to make informed decisions regarding treatment will improve Outcome: Progressing   Problem: Coping: Goal: Coping ability will improve Outcome: Progressing   Problem: Self-Concept: Goal: Ability to disclose and discuss suicidal ideas will improve Outcome: Progressing   Problem: Activity: Goal: Interest or engagement in activities will improve Outcome: Progressing   Problem: Safety: Goal: Periods of time without injury will increase Outcome: Progressing   Problem: Safety: Goal: Ability to remain free from injury will improve Outcome: Progressing

## 2017-11-22 NOTE — BHH Group Notes (Signed)
LCSW Group Therapy Note  11/22/2017 1:00pm  Type of Therapy/Topic:  Group Therapy:  Emotion Regulation  Participation Level:  Did Not Attend   Description of Group:    The purpose of this group is to assist patients in learning to regulate negative emotions and experience positive emotions. Patients will be guided to discuss ways in which they have been vulnerable to their negative emotions. These vulnerabilities will be juxtaposed with experiences of positive emotions or situations, and patients will be challenged to use positive emotions to combat negative ones. Special emphasis will be placed on coping with negative emotions in conflict situations, and patients will process healthy conflict resolution skills.  Therapeutic Goals: 1. Patient will identify two positive emotions or experiences to reflect on in order to balance out negative emotions 2. Patient will label two or more emotions that they find the most difficult to experience 3. Patient will demonstrate positive conflict resolution skills through discussion and/or role plays  Summary of Patient Progress:  Crystal Duarte was invited to today's group, but chose not to attend.     Therapeutic Modalities:   Cognitive Behavioral Therapy Feelings Identification Dialectical Behavioral Therapy

## 2017-11-23 MED ORDER — DICLOFENAC SODIUM 1 % TD GEL: 2.0000 g | Freq: Four times a day (QID) | TRANSDERMAL | 0 refills | Status: DC

## 2017-11-23 MED ORDER — CAPSAICIN 0.025 % EX CREA: TOPICAL_CREAM | Freq: Two times a day (BID) | CUTANEOUS | 0 refills | Status: DC

## 2017-11-23 MED ORDER — TRAZODONE HCL 100 MG PO TABS: 200.0000 mg | ORAL_TABLET | Freq: Every evening | ORAL | 0 refills | Status: DC | PRN

## 2017-11-23 MED ORDER — BUSPIRONE HCL 15 MG PO TABS: 15.0000 mg | ORAL_TABLET | Freq: Three times a day (TID) | ORAL | 0 refills | Status: DC

## 2017-11-23 MED ORDER — PROPRANOLOL HCL 20 MG PO TABS: 20.0000 mg | ORAL_TABLET | Freq: Three times a day (TID) | ORAL | 0 refills | Status: DC

## 2017-11-23 MED ORDER — HYDROXYZINE HCL 25 MG PO TABS: 25.0000 mg | ORAL_TABLET | Freq: Three times a day (TID) | ORAL | 0 refills | Status: DC | PRN

## 2017-11-23 MED ORDER — VORTIOXETINE HBR 5 MG PO TABS: 15.0000 mg | ORAL_TABLET | Freq: Every day | ORAL | 0 refills | Status: DC

## 2017-11-23 MED ORDER — GABAPENTIN 400 MG PO CAPS: 400.0000 mg | ORAL_CAPSULE | Freq: Three times a day (TID) | ORAL | 0 refills | Status: DC

## 2017-11-23 MED ORDER — RISAQUAD PO CAPS: 1.0000 | ORAL_CAPSULE | Freq: Every day | ORAL | 0 refills | Status: DC

## 2017-11-23 MED ORDER — NABUMETONE 500 MG PO TABS: 500.0000 mg | ORAL_TABLET | Freq: Two times a day (BID) | ORAL | 0 refills | Status: DC

## 2017-11-23 MED ORDER — LIOTHYRONINE SODIUM 25 MCG PO TABS: 12.5000 ug | ORAL_TABLET | Freq: Every day | ORAL | 0 refills | Status: DC

## 2017-11-23 NOTE — Progress Notes (Signed)
Recreation Therapy Notes  INPATIENT RECREATION TR PLAN  Patient Details Name: Malayla Granberry MRN: [id number] DOB: 1960-10-09 Today's Date: 11/23/2017  Rec Therapy Plan Is patient appropriate for Therapeutic Recreation?: Yes Treatment times per week: at least 3 Estimated Length of Stay: 5-7 days TR Treatment/Interventions: Group participation (Comment)  Discharge Criteria Pt will be discharged from therapy if:: Discharged Treatment plan/goals/alternatives discussed and agreed upon by:: Patient/family  Discharge Summary Short term goals set: Patient will engage in groups without prompting or encouragement from LRT x3 group sessions within 5 recreation therapy group sessions Short term goals met: Not met Reason goals not met: Patient did not attend any groups Therapeutic equipment acquired: N/A Reason patient discharged from therapy: Discharge from hospital Pt/family agrees with progress & goals achieved: Yes Date patient discharged from therapy: 11/23/17   Tylyn Derwin 11/23/2017, 11:28 AM

## 2017-11-23 NOTE — Plan of Care (Signed)
Mood and affect sad, flat, depressed, discouraged, disappointed about inability to go with ECT today, "they could not start PIV, the whole hospital!.." She became bright when I asked about [place] and [place] as a distraction from ECT. Trazodone 200 mg and Vistaril 25 mg given at bedtime.  Patient slept for Estimated Hours of 7.30; Precautionary checks every 15 minutes for safety maintained, room free of safety hazards, patient sustains no injury or falls during this shift.  Problem: Coping: Goal: Will verbalize feelings Outcome: Progressing   Problem: Health Behavior/Discharge Planning: Goal: Compliance with therapeutic regimen will improve Outcome: Progressing   Problem: Education: Goal: Ability to make informed decisions regarding treatment will improve Outcome: Progressing   Problem: Safety: Goal: Periods of time without injury will increase Outcome: Progressing   Problem: Safety: Goal: Ability to remain free from injury will improve Outcome: Progressing

## 2017-11-23 NOTE — Discharge Summary (Signed)
Physician Discharge Summary Note  Patient:  Crystal Duarte is an 57 y.o., female MRN:  [id number] DOB:  1961/02/29 Patient phone:  [phone] (home)  Patient address:   [place] [address],  Total Time spent with patient: 45 minutes  Date of Admission:  11/16/2017 Date of Discharge: November 23, 2017  Reason for Admission: Admitted in transfer from [facility] for consideration of ECT treatment for severe depression  Principal Problem: Severe recurrent major depression without psychotic features [facility]) Discharge Diagnoses: Patient Active Problem List   Diagnosis Date Noted  . Suicidal ideation [R45.851] 11/17/2017  . Severe recurrent major depression without psychotic features ([place]) [F33.2] 11/16/2017  . Severe episode of recurrent major depressive disorder, without psychotic features ([place]) [F33.2] 07/18/2017  . DOE (dyspnea on exertion) [R06.09] 12/26/2016  . COPD GOLD II  [J44.9] 12/26/2016  . Orthostatic hypotension [I95.1] 12/14/2016  . Chest tightness [R07.89] 12/14/2016  . Weight loss [R63.4] 12/14/2016  . Routine general medical examination at a health care facility [Z00.00] 10/09/2015  . 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 Hx of adenomatous polyp of colon [Z86.010] 12/04/2014  . Memory loss [R41.3] 10/04/2014  . Affective bipolar disorder ([place]) [F31.9] 03/12/2014  . Peripheral vascular disease, unspecified ([place]) [I73.9] 10/10/2013  . Smoker [F17.200] 08/28/2012  . Menopause [Z78.0] 08/28/2012  . Multiple pulmonary nodules [R91.8] 11/12/2010    Past Psychiatric History: History of recurrent depression with positive prior response to ECT recent suicidality resulting in acute hospitalization  Past Medical History:  Past Medical History:  Diagnosis Date  . Coccidioidomycosis, pulmonary ([place])   . Depression   . Hx of adenomatous polyp of colon 12/04/2014  . Osteopenia 11/2016   T score -2.2 FRAX 17%/2.4%  . Panic attack      Past Surgical History:  Procedure Laterality Date  . ABDOMINAL HYSTERECTOMY    . COLONOSCOPY  2001   hemorrhoidectomy  . LUNG SURGERY  2001   VATS surgery   . OVARIAN CYST REMOVAL  1970's  . VESICOVAGINAL FISTULA CLOSURE W/ TAH  2005   Family History:  Family History  Problem Relation Age of Onset  . Asthma Mother   . Ovarian cancer Mother   . Cancer Mother        OVARIAN  . Varicose Veins Mother   . Heart disease Father   . Peripheral vascular disease Father   . Cancer Maternal Grandmother        LUNG- LUNG  . Colon cancer Neg Hx    Family Psychiatric  History: See previous notes Social History:  Social History   Substance and Sexual Activity  Alcohol Use No  . Alcohol/week: 0.0 standard drinks     Social History   Substance and Sexual Activity  Drug Use No    Social History   Socioeconomic History  . Marital status: Divorced    Spouse name: Not on file  . Number of children: 2  . Years of education: Not on file  . Highest education level: Not on file  Occupational History  . Occupation: Forensic psychologist: [employer]  . Financial resource strain: Not on file  . Food insecurity:    Worry: Not on file    Inability: Not on file  . Transportation needs:    Medical: Not on file    Non-medical: Not on file  Tobacco Use  . Smoking status: Current Some Day Smoker    Packs/day: 1.00  Years: 42.00    Pack years: 42.00    Types: Cigarettes    Last attempt to quit: 09/25/2016    Years since quitting: 1.1  . Smokeless tobacco: Former Systems developer    Quit date: 06/13/2016  Substance and Sexual Activity  . Alcohol use: No    Alcohol/week: 0.0 standard drinks  . Drug use: No  . Sexual activity: Not on file  Lifestyle  . Physical activity:    Days per week: Not on file    Minutes per session: Not on file  . Stress: Not on file  Relationships  . Social connections:    Talks on phone: Not on file    Gets together: Not on file     Attends religious service: Not on file    Active member of club or organization: Not on file    Attends meetings of clubs or organizations: Not on file    Relationship status: Not on file  Other Topics Concern  . Not on file  Social History Narrative  . Not on file    Hospital Course: Patient was admitted to our hospital in transfer voluntarily from [facility].  Patient met with treatment team and I spoke with her about ECT with review of potential benefits and risks.  Patient was in favor of pursuing ECT.  I also spoke with her son at her request.  Patient had one right unilateral ECT treatment which was completed without complication.  Her psychiatric and other medications were continued without any significant change.  Patient reported mood improvement during her short time in the hospital and did not engage in any dangerous behavior.  We attempted a second ECT treatment but the patient ultimately declined the treatment because of difficulty placing the IV line.  We discussed pros and cons of further plans and the patient was in favor of discharge given the significant improvement she had had.  She is to follow-up with an outpatient provider in the community.  Physical Findings: AIMS: Facial and Oral Movements Muscles of Facial Expression: None, normal Lips and Perioral Area: None, normal Jaw: None, normal Tongue: None, normal,Extremity Movements Upper (arms, wrists, hands, fingers): None, normal Lower (legs, knees, ankles, toes): None, normal, Trunk Movements Neck, shoulders, hips: None, normal, Overall Severity Severity of abnormal movements (highest score from questions above): None, normal Incapacitation due to abnormal movements: None, normal Patient's awareness of abnormal movements (rate only patient's report): No Awareness, Dental Status Current problems with teeth and/or dentures?: No Does patient usually wear dentures?: No  CIWA:    COWS:      Musculoskeletal: Strength & Muscle Tone: within normal limits Gait & Station: normal Patient leans: N/A  Psychiatric Specialty Exam: Physical Exam  Nursing note and vitals reviewed. Constitutional: She appears well-developed and well-nourished.  HENT:  Head: Normocephalic and atraumatic.  Eyes: Pupils are equal, round, and reactive to light. Conjunctivae are normal.  Neck: Normal range of motion.  Cardiovascular: Regular rhythm and normal heart sounds.  Respiratory: Effort normal. No respiratory distress.  GI: Soft.  Musculoskeletal: Normal range of motion.  Neurological: She is alert.  Skin: Skin is warm and dry.  Psychiatric: She has a normal mood and affect. Her behavior is normal. Judgment and thought content normal.    Review of Systems  Constitutional: Negative.   HENT: Negative.   Eyes: Negative.   Respiratory: Negative.   Cardiovascular: Negative.   Gastrointestinal: Negative.   Musculoskeletal: Positive for myalgias.  Skin: Negative.   Neurological: Negative.  Psychiatric/Behavioral: Negative.     Blood pressure 109/76, pulse 68, temperature 97.9 F (36.6 C), temperature source Oral, resp. rate 16, height _0  (1.651 m), weight 85.3 kg, SpO2 100 %.Body mass index is 31.28 kg/m.  General Appearance: Fairly Groomed  Eye Contact:  Good  Speech:  Clear and Coherent  Volume:  Normal  Mood:  Euthymic  Affect:  Congruent  Thought Process:  Goal Directed  Orientation:  Full (Time, Place, and Person)  Thought Content:  Logical  Suicidal Thoughts:  No  Homicidal Thoughts:  No  Memory:  Immediate;   Fair Recent;   Fair Remote;   Fair  Judgement:  Fair  Insight:  Fair  Psychomotor Activity:  Normal  Concentration:  Concentration: Fair  Recall:  [place] of Knowledge:  Fair  Language:  Fair  Akathisia:  No  Handed:  Right  AIMS (if indicated):     Assets:  Communication Skills Housing Physical Health  ADL's:  Intact  Cognition:  WNL  Sleep:  Number  of Hours: 7.3     Have you used any form of tobacco in the last 30 days? (Cigarettes, Smokeless Tobacco, Cigars, and/or Pipes): Yes  Has this patient used any form of tobacco in the last 30 days? (Cigarettes, Smokeless Tobacco, Cigars, and/or Pipes) Yes, No  Blood Alcohol level:  Lab Results  Component Value Date   ETH <10 11/02/2017   ETH <10 16/01/9603    Metabolic Disorder Labs:  Lab Results  Component Value Date   HGBA1C 5.7 (H) 11/18/2017   MPG 116.89 11/18/2017   MPG 108.28 07/21/2017   No results found for: PROLACTIN Lab Results  Component Value Date   CHOL 232 (H) 11/18/2017   TRIG 230 (H) 11/18/2017   HDL 49 11/18/2017   CHOLHDL 4.7 11/18/2017   VLDL 46 (H) 11/18/2017   LDLCALC 137 (H) 11/18/2017   LDLCALC 104 (H) 07/21/2017    See Psychiatric Specialty Exam and Suicide Risk Assessment completed by Attending Physician prior to discharge.  Discharge destination:  Home  Is patient on multiple antipsychotic therapies at discharge:  No   Has Patient had three or more failed trials of antipsychotic monotherapy by history:  No  Recommended Plan for Multiple Antipsychotic Therapies: NA  Discharge Instructions    Diet - low sodium heart healthy   Complete by:  As directed    Increase activity slowly   Complete by:  As directed      Allergies as of 11/23/2017      Reactions   Latuda [lurasidone Hcl] Anxiety   Sulfa Antibiotics Rash   Only in sunlight       Medication List    TAKE these medications     Indication  acidophilus Caps capsule Take 1 capsule by mouth daily.  Indication:  probiotic   busPIRone 15 MG tablet Commonly known as:  BUSPAR Take 1 tablet (15 mg total) by mouth 3 (three) times daily.  Indication:  Anxiety Disorder   capsaicin 0.025 % cream Commonly known as:  ZOSTRIX Apply topically 2 (two) times daily. Affected area  Indication:  Pain   diclofenac sodium 1 % Gel Commonly known as:  VOLTAREN Apply 2 g topically 4 (four)  times daily.  Indication:  pain   gabapentin 400 MG capsule Commonly known as:  NEURONTIN Take 1 capsule (400 mg total) by mouth 3 (three) times daily.  Indication:  Social Anxiety Disorder, anxiety   hydrOXYzine 25 MG tablet Commonly known as:  ATARAX/VISTARIL Take 1 tablet (25 mg total) by mouth 3 (three) times daily as needed for anxiety.  Indication:  Feeling Anxious   liothyronine 25 MCG tablet Commonly known as:  CYTOMEL Take 0.5 tablets (12.5 mcg total) by mouth daily.  Indication:  Depression   nabumetone 500 MG tablet Commonly known as:  RELAFEN Take 1 tablet (500 mg total) by mouth 2 (two) times daily.  Indication:  arthritc pain   propranolol 20 MG tablet Commonly known as:  INDERAL Take 1 tablet (20 mg total) by mouth 3 (three) times daily.  Indication:  anxiety   traZODone 100 MG tablet Commonly known as:  DESYREL Take 2 tablets (200 mg total) by mouth at bedtime as needed for sleep.  Indication:  Trouble Sleeping   vortioxetine HBr 5 MG Tabs tablet Commonly known as:  TRINTELLIX Take 3 tablets (15 mg total) by mouth daily.  Indication:  Major Depressive Disorder      Follow-up Information    [place] on 12/25/2017.   Why:  Please follow up with your provider on Monday December 25, 2017 at 11am. The office will call you and schedule something sooner after arranging with the doctor. Thank you. Contact information: Address: [address] [place], [place] [address] Phone: [phone] Fax: [phone]       Dr. Doroteo Glassman PhD-[facility]. Go on 11/30/2017.   Why:  Please follow up with Mrs. Wilson on Thursday November 30, 2017 at 4pm. Please follow up with her to confirm this appointment. She will also follow up with you if something sooner becomes avalible. Thank you.  Contact information: [address] [place], [place] [address] Phone: [phone]. [phone] Fax: [phone]         [facility] Follow up.   Why:  Providers will call you regarding next appointment information if applicable. Contact information: [place] [id number] [address] [place] [phone]          Follow-up recommendations:  Activity:  As tolerated Diet:  Regular diet Other:  Follow-up outpatient treatment in the community  Comments: We discussed the option of a PICC line for further ECT treatment.  This is out of my usual experience and I advised the patient and I was not sure if that would really be appropriate although he could consult with her outpatient provider in the future.  Signed: Alethia Berthold, MD 11/23/2017, 6:20 PM

## 2017-11-23 NOTE — BHH Suicide Risk Assessment (Signed)
[facility] Discharge Suicide Risk Assessment   Principal Problem: Severe recurrent major depression without psychotic features [facility]) Discharge Diagnoses:  Patient Active Problem List   Diagnosis Date Noted  . Suicidal ideation [R45.851] 11/17/2017  . Severe recurrent major depression without psychotic features ([place]) [F33.2] 11/16/2017  . Severe episode of recurrent major depressive disorder, without psychotic features ([place]) [F33.2] 07/18/2017  . DOE (dyspnea on exertion) [R06.09] 12/26/2016  . COPD GOLD II  [J44.9] 12/26/2016  . Orthostatic hypotension [I95.1] 12/14/2016  . Chest tightness [R07.89] 12/14/2016  . Weight loss [R63.4] 12/14/2016  . Routine general medical examination at a health care facility [Z00.00] 10/09/2015  . 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 Hx of adenomatous polyp of colon [Z86.010] 12/04/2014  . Memory loss [R41.3] 10/04/2014  . Affective bipolar disorder ([place]) [F31.9] 03/12/2014  . Peripheral vascular disease, unspecified ([place]) [I73.9] 10/10/2013  . Smoker [F17.200] 08/28/2012  . Menopause [Z78.0] 08/28/2012  . Multiple pulmonary nodules [R91.8] 11/12/2010    Total Time spent with patient: 1 hour  Musculoskeletal: Strength & Muscle Tone: within normal limits Gait & Station: normal Patient leans: N/A  Psychiatric Specialty Exam: Review of Systems  Constitutional: Negative.   HENT: Negative.   Eyes: Negative.   Respiratory: Negative.   Cardiovascular: Negative.   Gastrointestinal: Negative.   Musculoskeletal: Positive for back pain, joint pain and myalgias.  Skin: Negative.   Neurological: Negative.   Psychiatric/Behavioral: Negative for depression, hallucinations, memory loss, substance abuse and suicidal ideas. The patient is not nervous/anxious and does not have insomnia.     Blood pressure 111/66, pulse 89, temperature 98.7 F (37.1 C), temperature source Oral, resp. rate 16, height 5\' 5"  (1.651 m), weight 85.3 kg, SpO2 98 %.Body mass  index is 31.28 kg/m.  General Appearance: Casual  Eye Contact::  Good  Speech:  Clear and [id number]  Volume:  Normal  Mood:  Euthymic  Affect:  Appropriate  Thought Process:  Goal Directed  Orientation:  Full (Time, Place, and Person)  Thought Content:  Logical  Suicidal Thoughts:  No  Homicidal Thoughts:  No  Memory:  Immediate;   Fair Recent;   Fair Remote;   Fair  Judgement:  Fair  Insight:  Fair  Psychomotor Activity:  Normal  Concentration:  Fair  Recall:  [employer] [place]  Language: Fair  Akathisia:  No  Handed:  Right  AIMS (if indicated):     Assets:  Desire for Improvement Housing Physical Health Resilience Social Support  Sleep:  Number of Hours: 7.3  Cognition: WNL  ADL's:  Intact   Mental Status Per Nursing Assessment::   On Admission:  Suicidal ideation indicated by patient, Self-harm thoughts, Belief that plan would result in death  Demographic Factors:  Caucasian  Loss Factors: Decline in physical health and Financial problems/change in socioeconomic status  Historical Factors: Prior suicide attempts  Risk Reduction Factors:   Sense of responsibility to family, Religious beliefs about death, Living with another person, especially a relative, Positive social support and Positive therapeutic relationship  Continued Clinical Symptoms:  Depression:   Severe  Cognitive Features That Contribute To Risk:  Thought constriction (tunnel vision)    Suicide Risk:  Minimal: No identifiable suicidal ideation.  Patients presenting with no risk factors but with morbid ruminations; may be classified as minimal risk based on the severity of the depressive symptoms  Follow-up [place] on 12/25/2017.   Why:  Please follow up with your  provider on Monday December 25, 2017 at 11am. The office will call you and schedule something sooner after arranging with the doctor. Thank you. Contact  information: Address: [address], [place], [place] [address] Phone: [phone] Fax: [phone]       Dr. Doroteo Glassman PhD-[facility]. Go on 11/30/2017.   Why:  Please follow up with Mrs. Wilson on Thursday November 30, 2017 at 4pm. Please follow up with her to confirm this appointment. She will also follow up with you if something sooner becomes avalible. Thank you.  Contact information: [address] [place],  [address] Phone: [phone]. [phone] Fax: [phone]           Plan Of Care/Follow-up recommendations:  Activity:  as tolerated Diet:  regular Other:  follow up outpatient psychiatry  Alethia Berthold, MD 11/23/2017, 10:22 AM

## 2017-11-23 NOTE — Progress Notes (Signed)
Recreation Therapy Notes  Date: 11/23/2017  Time: 9:30 am   Location: Craft Room   Behavioral response: N/A   Intervention Topic: Happiness  Discussion/Intervention: Patient did not attend group.   Clinical Observations/Feedback:  Patient did not attend group.   Selvin Yun LRT/CTRS        Dequante Tremaine 11/23/2017 11:26 AM

## 2017-11-23 NOTE — Progress Notes (Signed)
Patient ID: Crystal Duarte, female   DOB: 1960/06/24, 57 y.o.   MRN: [id number]   Discharge Note:  Patient denies SI/HI/AVH at this time. Discharge instructions, AVS, prescriptions, and transition record gone over with patient. Patient agrees to comply with medication management, follow-up visit, and outpatient therapy. Patient belongings returned to patient. Patient questions and concerns addressed and answered. Patient ambulatory off unit. Patient discharged to home with son.

## 2017-11-23 NOTE — Progress Notes (Signed)
  [facility] Adult Case Management Discharge Plan :  Will you be returning to the same living situation after discharge:  Yes,  Home with family At discharge, do you have transportation home?: Yes,  Family will come at discharge Do you have the ability to pay for your medications: Yes,  Insurance   Release of information consent forms completed and in the chart;  Patient's signature needed at discharge.  Patient to Follow up at: Follow-up [facility] on 12/25/2017.   Why:  Please follow up with your provider on Monday December 25, 2017 at 11am. The office will call you and schedule something sooner after arranging with the doctor. Thank you. Contact information: Address: [address] [place], [place] [address] Phone: [phone] Fax: [phone]       Dr. Doroteo Glassman PhD-[facility]. Go on 11/30/2017.   Why:  Please follow up with Mrs. Wilson on Thursday November 30, 2017 at 4pm. Please follow up with her to confirm this appointment. She will also follow up with you if something sooner becomes avalible. Thank you.  Contact information: [address] [place],  [address] Phone: [phone] ext. [phone] Fax: [phone]        [facility] Follow up.   Why:  Providers will call you regarding next appointment information if applicable. Contact information: [place] [phone] [address] [place] [phone]          Next level of care provider has access to [place] and Suicide Prevention discussed: Yes,  Completed with patient and son  Have you used any form of tobacco in the last 30 days? (Cigarettes, Smokeless Tobacco, Cigars, and/or Pipes): Yes  Has patient been referred to the Quitline?: Patient refused referral  Patient has been referred for addiction treatment: N/A  Darin Engels, LCSW 11/23/2017, 12:20 PM

## 2017-11-28 DIAGNOSIS — R69 Illness, unspecified: Secondary | ICD-10-CM | POA: Diagnosis not present

## 2017-11-28 DIAGNOSIS — F411 Generalized anxiety disorder: Secondary | ICD-10-CM | POA: Diagnosis not present

## 2017-11-30 DIAGNOSIS — R69 Illness, unspecified: Secondary | ICD-10-CM | POA: Diagnosis not present

## 2017-12-01 DIAGNOSIS — S52552D Other extraarticular fracture of lower end of left radius, subsequent encounter for closed fracture with routine healing: Secondary | ICD-10-CM | POA: Diagnosis not present

## 2017-12-01 DIAGNOSIS — M25532 Pain in left wrist: Secondary | ICD-10-CM | POA: Diagnosis not present

## 2017-12-01 DIAGNOSIS — M25632 Stiffness of left wrist, not elsewhere classified: Secondary | ICD-10-CM | POA: Diagnosis not present

## 2017-12-01 DIAGNOSIS — M25432 Effusion, left wrist: Secondary | ICD-10-CM | POA: Diagnosis not present

## 2017-12-02 ENCOUNTER — Other Ambulatory Visit: Payer: Self-pay | Admitting: Psychiatry

## 2017-12-05 DIAGNOSIS — R69 Illness, unspecified: Secondary | ICD-10-CM | POA: Diagnosis not present

## 2017-12-06 DIAGNOSIS — M25432 Effusion, left wrist: Secondary | ICD-10-CM | POA: Diagnosis not present

## 2017-12-06 DIAGNOSIS — M25632 Stiffness of left wrist, not elsewhere classified: Secondary | ICD-10-CM | POA: Diagnosis not present

## 2017-12-06 DIAGNOSIS — S52552D Other extraarticular fracture of lower end of left radius, subsequent encounter for closed fracture with routine healing: Secondary | ICD-10-CM | POA: Diagnosis not present

## 2017-12-06 DIAGNOSIS — R69 Illness, unspecified: Secondary | ICD-10-CM | POA: Diagnosis not present

## 2017-12-06 DIAGNOSIS — M25532 Pain in left wrist: Secondary | ICD-10-CM | POA: Diagnosis not present

## 2017-12-11 ENCOUNTER — Encounter: Payer: Medicare HMO | Admitting: Gynecology

## 2017-12-12 DIAGNOSIS — M25432 Effusion, left wrist: Secondary | ICD-10-CM | POA: Diagnosis not present

## 2017-12-12 DIAGNOSIS — S52552A Other extraarticular fracture of lower end of left radius, initial encounter for closed fracture: Secondary | ICD-10-CM | POA: Diagnosis not present

## 2017-12-12 DIAGNOSIS — M25532 Pain in left wrist: Secondary | ICD-10-CM | POA: Diagnosis not present

## 2017-12-12 DIAGNOSIS — S52552D Other extraarticular fracture of lower end of left radius, subsequent encounter for closed fracture with routine healing: Secondary | ICD-10-CM | POA: Diagnosis not present

## 2017-12-12 DIAGNOSIS — M25632 Stiffness of left wrist, not elsewhere classified: Secondary | ICD-10-CM | POA: Diagnosis not present

## 2017-12-13 DIAGNOSIS — R69 Illness, unspecified: Secondary | ICD-10-CM | POA: Diagnosis not present

## 2017-12-14 DIAGNOSIS — F411 Generalized anxiety disorder: Secondary | ICD-10-CM | POA: Diagnosis not present

## 2017-12-14 DIAGNOSIS — R69 Illness, unspecified: Secondary | ICD-10-CM | POA: Diagnosis not present

## 2017-12-15 DIAGNOSIS — R69 Illness, unspecified: Secondary | ICD-10-CM | POA: Diagnosis not present

## 2017-12-16 ENCOUNTER — Other Ambulatory Visit: Payer: Self-pay | Admitting: Psychiatry

## 2017-12-18 DIAGNOSIS — R69 Illness, unspecified: Secondary | ICD-10-CM | POA: Diagnosis not present

## 2017-12-19 DIAGNOSIS — R69 Illness, unspecified: Secondary | ICD-10-CM | POA: Diagnosis not present

## 2017-12-20 DIAGNOSIS — R69 Illness, unspecified: Secondary | ICD-10-CM | POA: Diagnosis not present

## 2017-12-21 DIAGNOSIS — R69 Illness, unspecified: Secondary | ICD-10-CM | POA: Diagnosis not present

## 2017-12-22 DIAGNOSIS — R69 Illness, unspecified: Secondary | ICD-10-CM | POA: Diagnosis not present

## 2017-12-22 DIAGNOSIS — M25432 Effusion, left wrist: Secondary | ICD-10-CM | POA: Diagnosis not present

## 2017-12-22 DIAGNOSIS — M25532 Pain in left wrist: Secondary | ICD-10-CM | POA: Diagnosis not present

## 2017-12-22 DIAGNOSIS — M25632 Stiffness of left wrist, not elsewhere classified: Secondary | ICD-10-CM | POA: Diagnosis not present

## 2017-12-22 DIAGNOSIS — S52552D Other extraarticular fracture of lower end of left radius, subsequent encounter for closed fracture with routine healing: Secondary | ICD-10-CM | POA: Diagnosis not present

## 2017-12-25 DIAGNOSIS — F411 Generalized anxiety disorder: Secondary | ICD-10-CM | POA: Diagnosis not present

## 2017-12-25 DIAGNOSIS — R69 Illness, unspecified: Secondary | ICD-10-CM | POA: Diagnosis not present

## 2017-12-26 DIAGNOSIS — M25532 Pain in left wrist: Secondary | ICD-10-CM | POA: Diagnosis not present

## 2017-12-26 DIAGNOSIS — M25632 Stiffness of left wrist, not elsewhere classified: Secondary | ICD-10-CM | POA: Diagnosis not present

## 2017-12-26 DIAGNOSIS — M25432 Effusion, left wrist: Secondary | ICD-10-CM | POA: Diagnosis not present

## 2017-12-26 DIAGNOSIS — R69 Illness, unspecified: Secondary | ICD-10-CM | POA: Diagnosis not present

## 2017-12-26 DIAGNOSIS — S52552D Other extraarticular fracture of lower end of left radius, subsequent encounter for closed fracture with routine healing: Secondary | ICD-10-CM | POA: Diagnosis not present

## 2017-12-27 ENCOUNTER — Encounter: Payer: Self-pay | Admitting: Gynecology

## 2017-12-27 ENCOUNTER — Ambulatory Visit: Payer: Medicare HMO | Admitting: Gynecology

## 2017-12-27 VITALS — BP 120/74 | Ht 64.0 in | Wt 184.0 lb

## 2017-12-27 DIAGNOSIS — Z01419 Encounter for gynecological examination (general) (routine) without abnormal findings: Secondary | ICD-10-CM | POA: Diagnosis not present

## 2017-12-27 DIAGNOSIS — N952 Postmenopausal atrophic vaginitis: Secondary | ICD-10-CM | POA: Diagnosis not present

## 2017-12-27 DIAGNOSIS — Z1272 Encounter for screening for malignant neoplasm of vagina: Secondary | ICD-10-CM | POA: Diagnosis not present

## 2017-12-27 DIAGNOSIS — R69 Illness, unspecified: Secondary | ICD-10-CM | POA: Diagnosis not present

## 2017-12-27 NOTE — Patient Instructions (Signed)
Call to Schedule your mammogram   1)  [facility]. [place] [address] Phone: [phone]     Mammogram A mammogram is an X-ray test to find changes in a woman's breast. You should get a mammogram if:  You are 58 years of age or older  You have risk factors.   Your doctor recommends that you have one.  BEFORE THE TEST  Do not schedule the test the week before your period, especially if your breasts are sore during this time.  On the day of your mammogram:  Wash your breasts and armpits well. After washing, do not put on any deodorant or talcum powder on until after your test.   Eat and drink as you usually do.   Take your medicines as usual.   If you are diabetic and take insulin, make sure you:   Eat before coming for your test.   Take your insulin as usual.   If you cannot keep your appointment, call before the appointment to cancel. Schedule another appointment.  TEST  You will need to undress from the waist up. You will put on a hospital gown.   Your breast will be put on the mammogram machine, and it will press firmly on your breast with a piece of plastic called a compression paddle. This will make your breast flatter so that the machine can X-ray all parts of your breast.   Both breasts will be X-rayed. Each breast will be X-rayed from above and from the side. An X-ray might need to be taken again if the picture is not good enough.   The mammogram will last about 15 to 30 minutes.  AFTER THE TEST Finding out the results of your test Ask when your test results will be ready. Make sure you get your test results.  Document Released: 06/17/2008 Document Revised: 03/10/2011 Document Reviewed: 06/17/2008 [facility] Patient Information 2012 [place].

## 2017-12-27 NOTE — Progress Notes (Signed)
    Crystal Duarte May 01, 1960 [id number]        57 y.o.  G2P2 for annual gynecologic exam.  Former patient of Dr. Toney Rakes.  Status post TVH BSO in the past.  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  Colposcopy was negative.  Follow-up Pap smear/HPV was negative and 2016.  Pap smear 2017 was negative.  No Pap smear 2018.  Past medical history,surgical history, problem list, medications, allergies, family history and social history were all reviewed and documented as reviewed in the EPIC chart.  ROS:  Performed with pertinent positives and negatives included in the history, assessment and plan.   Additional significant findings : None   Exam: Crystal Duarte assistant Vitals:   12/27/17 1203  BP: 120/74  Weight: 184 lb (83.5 kg)  Height: 5\' 4"  (1.626 m)   Body mass index is 31.58 kg/m.  General appearance:  Normal affect, orientation and appearance. Skin: Grossly normal HEENT: Without gross lesions.  No cervical or supraclavicular adenopathy. Thyroid normal.  Lungs:  Clear without wheezing, rales or rhonchi Cardiac: RR, without RMG Abdominal:  Soft, nontender, without masses, guarding, rebound, organomegaly or hernia Breasts:  Examined lying and sitting without masses, retractions, discharge or axillary adenopathy. Pelvic:  Ext, BUS, Vagina: With atrophic changes.  Scattered classic appearing small sebaceous cysts both labia majora.  Pap smear of vaginal cuff done  Adnexa: Without masses or tenderness    Anus and perineum: Normal   Rectovaginal: Normal sphincter tone without palpated masses or tenderness.    Assessment/Plan:  57 y.o. G2P2 female for annual gynecologic exam.   1. Postmenopausal status post TVH BSO in the past.  Without significant menopausal symptoms. 2. History of VAIN 1.  Positive high risk HPV 2015.  Follow-up screen 2016 negative.  Pap smear done today. 3. Osteopenia.  DEXA 11/2016 T score -2.2 FRAX 17% / 2.4%.   Vitamin D level in the 50 range.  We will plan repeat DEXA next year at 2-year interval.  Has quit smoking 4. Mammography overdue.  Recommended patient schedule screening mammogram and she agrees to do so.  Names and numbers provided.  Breast exam normal today. 5. Sebaceous cysts labia majora.  Not bothersome to the patient.  Discussed with her and she will monitor for now.  Will report if any enlarge or change and become bothersome. 6. Colonoscopy 2016.  Repeat at their recommended interval. 7. Health maintenance.  No routine lab work done as patient does this elsewhere.  Has quit smoking and she is in a weight loss program now.  Follow-up in 1 year, sooner as needed.   Anastasio Auerbach MD, 12:45 PM 12/27/2017

## 2017-12-27 NOTE — Addendum Note (Signed)
Addended by: Nelva Nay on: 12/27/2017 01:02 PM   Modules accepted: Orders

## 2017-12-28 DIAGNOSIS — R69 Illness, unspecified: Secondary | ICD-10-CM | POA: Diagnosis not present

## 2017-12-28 LAB — PAP IG W/ RFLX HPV ASCU

## 2017-12-29 DIAGNOSIS — R69 Illness, unspecified: Secondary | ICD-10-CM | POA: Diagnosis not present

## 2018-01-01 DIAGNOSIS — R69 Illness, unspecified: Secondary | ICD-10-CM | POA: Diagnosis not present

## 2018-01-02 DIAGNOSIS — M25532 Pain in left wrist: Secondary | ICD-10-CM | POA: Diagnosis not present

## 2018-01-02 DIAGNOSIS — R69 Illness, unspecified: Secondary | ICD-10-CM | POA: Diagnosis not present

## 2018-01-02 DIAGNOSIS — M25632 Stiffness of left wrist, not elsewhere classified: Secondary | ICD-10-CM | POA: Diagnosis not present

## 2018-01-02 DIAGNOSIS — M25432 Effusion, left wrist: Secondary | ICD-10-CM | POA: Diagnosis not present

## 2018-01-02 DIAGNOSIS — S52552D Other extraarticular fracture of lower end of left radius, subsequent encounter for closed fracture with routine healing: Secondary | ICD-10-CM | POA: Diagnosis not present

## 2018-01-03 DIAGNOSIS — R69 Illness, unspecified: Secondary | ICD-10-CM | POA: Diagnosis not present

## 2018-01-04 DIAGNOSIS — R69 Illness, unspecified: Secondary | ICD-10-CM | POA: Diagnosis not present

## 2018-01-05 DIAGNOSIS — R69 Illness, unspecified: Secondary | ICD-10-CM | POA: Diagnosis not present

## 2018-01-08 ENCOUNTER — Other Ambulatory Visit: Payer: Self-pay | Admitting: Gynecology

## 2018-01-08 DIAGNOSIS — Z1231 Encounter for screening mammogram for malignant neoplasm of breast: Secondary | ICD-10-CM

## 2018-01-08 DIAGNOSIS — R69 Illness, unspecified: Secondary | ICD-10-CM | POA: Diagnosis not present

## 2018-01-08 DIAGNOSIS — F411 Generalized anxiety disorder: Secondary | ICD-10-CM | POA: Diagnosis not present

## 2018-01-09 ENCOUNTER — Ambulatory Visit (INDEPENDENT_AMBULATORY_CARE_PROVIDER_SITE_OTHER): Payer: Medicare HMO | Admitting: Internal Medicine

## 2018-01-09 ENCOUNTER — Encounter: Payer: Self-pay | Admitting: Internal Medicine

## 2018-01-09 VITALS — BP 120/80 | HR 83 | Temp 98.6°F | Ht 64.0 in | Wt 188.0 lb

## 2018-01-09 DIAGNOSIS — I739 Peripheral vascular disease, unspecified: Secondary | ICD-10-CM | POA: Diagnosis not present

## 2018-01-09 DIAGNOSIS — M25532 Pain in left wrist: Secondary | ICD-10-CM | POA: Diagnosis not present

## 2018-01-09 DIAGNOSIS — F17200 Nicotine dependence, unspecified, uncomplicated: Secondary | ICD-10-CM

## 2018-01-09 DIAGNOSIS — F3160 Bipolar disorder, current episode mixed, unspecified: Secondary | ICD-10-CM

## 2018-01-09 DIAGNOSIS — S52552D Other extraarticular fracture of lower end of left radius, subsequent encounter for closed fracture with routine healing: Secondary | ICD-10-CM | POA: Diagnosis not present

## 2018-01-09 DIAGNOSIS — Z0000 Encounter for general adult medical examination without abnormal findings: Secondary | ICD-10-CM | POA: Diagnosis not present

## 2018-01-09 DIAGNOSIS — M25632 Stiffness of left wrist, not elsewhere classified: Secondary | ICD-10-CM | POA: Diagnosis not present

## 2018-01-09 DIAGNOSIS — M25432 Effusion, left wrist: Secondary | ICD-10-CM | POA: Diagnosis not present

## 2018-01-09 DIAGNOSIS — J449 Chronic obstructive pulmonary disease, unspecified: Secondary | ICD-10-CM | POA: Diagnosis not present

## 2018-01-09 DIAGNOSIS — Z23 Encounter for immunization: Secondary | ICD-10-CM | POA: Diagnosis not present

## 2018-01-09 DIAGNOSIS — R69 Illness, unspecified: Secondary | ICD-10-CM | POA: Diagnosis not present

## 2018-01-09 NOTE — Patient Instructions (Addendum)
We have given you the shingles shot and the flu shot today. Come back in 2 months for the last shingles shot.   Health Maintenance, Female Adopting a healthy lifestyle and getting preventive care can go a long way to promote health and wellness. Talk with your health care provider about what schedule of regular examinations is right for you. This is a good chance for you to check in with your provider about disease prevention and staying healthy. In between checkups, there are plenty of things you can do on your own. Experts have done a lot of research about which lifestyle changes and preventive measures are most likely to keep you healthy. Ask your health care provider for more information. Weight and diet Eat a healthy diet  Be sure to include plenty of vegetables, fruits, low-fat dairy products, and lean protein.  Do not eat a lot of foods high in solid fats, added sugars, or salt.  Get regular exercise. This is one of the most important things you can do for your health. ? Most adults should exercise for at least 150 minutes each week. The exercise should increase your heart rate and make you sweat (moderate-intensity exercise). ? Most adults should also do strengthening exercises at least twice a week. This is in addition to the moderate-intensity exercise.  Maintain a healthy weight  Body mass index (BMI) is a measurement that can be used to identify possible weight problems. It estimates body fat based on height and weight. Your health care provider can help determine your BMI and help you achieve or maintain a healthy weight.  For females 24 years of age and older: ? A BMI below 18.5 is considered underweight. ? A BMI of 18.5 to 24.9 is normal. ? A BMI of 25 to 29.9 is considered overweight. ? A BMI of 30 and above is considered obese.  Watch levels of cholesterol and blood lipids  You should start having your blood tested for lipids and cholesterol at 58 years of age, then have  this test every 5 years.  You may need to have your cholesterol levels checked more often if: ? Your lipid or cholesterol levels are high. ? You are older than 58 years of age. ? You are at high risk for heart disease.  Cancer screening Lung Cancer  Lung cancer screening is recommended for adults 15-72 years old who are at high risk for lung cancer because of a history of smoking.  A yearly low-dose CT scan of the lungs is recommended for people who: ? Currently smoke. ? Have quit within the past 15 years. ? Have at least a 30-pack-year history of smoking. A pack year is smoking an average of one pack of cigarettes a day for 1 year.  Yearly screening should continue until it has been 15 years since you quit.  Yearly screening should stop if you develop a health problem that would prevent you from having lung cancer treatment.  Breast Cancer  Practice breast self-awareness. This means understanding how your breasts normally appear and feel.  It also means doing regular breast self-exams. Let your health care provider know about any changes, no matter how small.  If you are in your 20s or 30s, you should have a clinical breast exam (CBE) by a health care provider every 1-3 years as part of a regular health exam.  If you are 72 or older, have a CBE every year. Also consider having a breast X-ray (mammogram) every year.  If  you have a family history of breast cancer, talk to your health care provider about genetic screening.  If you are at high risk for breast cancer, talk to your health care provider about having an MRI and a mammogram every year.  Breast cancer gene (BRCA) assessment is recommended for women who have family members with BRCA-related cancers. BRCA-related cancers include: ? Breast. ? Ovarian. ? Tubal. ? Peritoneal cancers.  Results of the assessment will determine the need for genetic counseling and BRCA1 and BRCA2 testing.  Cervical Cancer Your health care  provider may recommend that you be screened regularly for cancer of the pelvic organs (ovaries, uterus, and vagina). This screening involves a pelvic examination, including checking for microscopic changes to the surface of your cervix (Pap test). You may be encouraged to have this screening done every 3 years, beginning at age 60.  For women ages 28-65, health care providers may recommend pelvic exams and Pap testing every 3 years, or they may recommend the Pap and pelvic exam, combined with testing for human papilloma virus (HPV), every 5 years. Some types of HPV increase your risk of cervical cancer. Testing for HPV may also be done on women of any age with unclear Pap test results.  Other health care providers may not recommend any screening for nonpregnant women who are considered low risk for pelvic cancer and who do not have symptoms. Ask your health care provider if a screening pelvic exam is right for you.  If you have had past treatment for cervical cancer or a condition that could lead to cancer, you need Pap tests and screening for cancer for at least 20 years after your treatment. If Pap tests have been discontinued, your risk factors (such as having a new sexual partner) need to be reassessed to determine if screening should resume. Some women have medical problems that increase the chance of getting cervical cancer. In these cases, your health care provider may recommend more frequent screening and Pap tests.  Colorectal Cancer  This type of cancer can be detected and often prevented.  Routine colorectal cancer screening usually begins at 58 years of age and continues through 58 years of age.  Your health care provider may recommend screening at an earlier age if you have risk factors for colon cancer.  Your health care provider may also recommend using home test kits to check for hidden blood in the stool.  A small camera at the end of a tube can be used to examine your colon  directly (sigmoidoscopy or colonoscopy). This is done to check for the earliest forms of colorectal cancer.  Routine screening usually begins at age 62.  Direct examination of the colon should be repeated every 5-10 years through 58 years of age. However, you may need to be screened more often if early forms of precancerous polyps or small growths are found.  Skin Cancer  Check your skin from head to toe regularly.  Tell your health care provider about any new moles or changes in moles, especially if there is a change in a mole's shape or color.  Also tell your health care provider if you have a mole that is larger than the size of a pencil eraser.  Always use sunscreen. Apply sunscreen liberally and repeatedly throughout the day.  Protect yourself by wearing long sleeves, pants, a wide-brimmed hat, and sunglasses whenever you are outside.  Heart disease, diabetes, and high blood pressure  High blood pressure causes heart disease and increases  the risk of stroke. High blood pressure is more likely to develop in: ? People who have blood pressure in the high end of the normal range (130-139/85-89 mm Hg). ? People who are overweight or obese. ? People who are African American.  If you are 72-28 years of age, have your blood pressure checked every 3-5 years. If you are 1 years of age or older, have your blood pressure checked every year. You should have your blood pressure measured twice-once when you are at a hospital or clinic, and once when you are not at a hospital or clinic. Record the average of the two measurements. To check your blood pressure when you are not at a hospital or clinic, you can use: ? An automated blood pressure machine at a pharmacy. ? A home blood pressure monitor.  If you are between 59 years and 56 years old, ask your health care provider if you should take aspirin to prevent strokes.  Have regular diabetes screenings. This involves taking a blood sample to  check your fasting blood sugar level. ? If you are at a normal weight and have a low risk for diabetes, have this test once every three years after 58 years of age. ? If you are overweight and have a high risk for diabetes, consider being tested at a younger age or more often. Preventing infection Hepatitis B  If you have a higher risk for hepatitis B, you should be screened for this virus. You are considered at high risk for hepatitis B if: ? You were born in a country where hepatitis B is common. Ask your health care provider which countries are considered high risk. ? Your parents were born in a high-risk country, and you have not been immunized against hepatitis B (hepatitis B vaccine). ? You have HIV or AIDS. ? You use needles to inject street drugs. ? You live with someone who has hepatitis B. ? You have had sex with someone who has hepatitis B. ? You get hemodialysis treatment. ? You take certain medicines for conditions, including cancer, organ transplantation, and autoimmune conditions.  Hepatitis C  Blood testing is recommended for: ? Everyone born from [birth year] through 1966. ? Anyone with known risk factors for hepatitis C.  Sexually transmitted infections (STIs)  You should be screened for sexually transmitted infections (STIs) including gonorrhea and chlamydia if: ? You are sexually active and are younger than 58 years of age. ? You are older than 58 years of age and your health care provider tells you that you are at risk for this type of infection. ? Your sexual activity has changed since you were last screened and you are at an increased risk for chlamydia or gonorrhea. Ask your health care provider if you are at risk.  If you do not have HIV, but are at risk, it may be recommended that you take a prescription medicine daily to prevent HIV infection. This is called pre-exposure prophylaxis (PrEP). You are considered at risk if: ? You are sexually active and do not regularly  use condoms or know the HIV status of your partner(s). ? You take drugs by injection. ? You are sexually active with a partner who has HIV.  Talk with your health care provider about whether you are at high risk of being infected with HIV. If you choose to begin PrEP, you should first be tested for HIV. You should then be tested every 3 months for as long as you are taking PrEP.  Pregnancy  If you are premenopausal and you may become pregnant, ask your health care provider about preconception counseling.  If you may become pregnant, take 400 to 800 micrograms (mcg) of folic acid every day.  If you want to prevent pregnancy, talk to your health care provider about birth control (contraception). Osteoporosis and menopause  Osteoporosis is a disease in which the bones lose minerals and strength with aging. This can result in serious bone fractures. Your risk for osteoporosis can be identified using a bone density scan.  If you are 36 years of age or older, or if you are at risk for osteoporosis and fractures, ask your health care provider if you should be screened.  Ask your health care provider whether you should take a calcium or vitamin D supplement to lower your risk for osteoporosis.  Menopause may have certain physical symptoms and risks.  Hormone replacement therapy may reduce some of these symptoms and risks. Talk to your health care provider about whether hormone replacement therapy is right for you. Follow these instructions at home:  Schedule regular health, dental, and eye exams.  Stay current with your immunizations.  Do not use any tobacco products including cigarettes, chewing tobacco, or electronic cigarettes.  If you are pregnant, do not drink alcohol.  If you are breastfeeding, limit how much and how often you drink alcohol.  Limit alcohol intake to no more than 1 drink per day for nonpregnant women. One drink equals 12 ounces of beer, 5 ounces of wine, or 1 ounces  of hard liquor.  Do not use street drugs.  Do not share needles.  Ask your health care provider for help if you need support or information about quitting drugs.  Tell your health care provider if you often feel depressed.  Tell your health care provider if you have ever been abused or do not feel safe at home. This information is not intended to replace advice given to you by your health care provider. Make sure you discuss any questions you have with your health care provider. Document Released: 10/04/2010 Document Revised: 08/27/2015 Document Reviewed: 12/23/2014 Elsevier Interactive Patient Education  [employer].

## 2018-01-09 NOTE — Progress Notes (Signed)
   Subjective:    Patient ID: Crystal Duarte, female    DOB: Oct 30, 1960, 57 y.o.   MRN: [id number]  HPI The patient is a 57 YO female coming in for physical.  PMH, [place], social history reviewed and updated.   Review of Systems  Constitutional: Negative.   HENT: Negative.   Eyes: Negative.   Respiratory: Negative for cough, chest tightness and shortness of breath.   Cardiovascular: Negative for chest pain, palpitations and leg swelling.  Gastrointestinal: Negative for abdominal distention, abdominal pain, constipation, diarrhea, nausea and vomiting.  Musculoskeletal: Negative.   Skin: Negative.   Neurological: Negative.   Psychiatric/Behavioral: Positive for decreased concentration and dysphoric mood.      Objective:   Physical Exam  Constitutional: She is oriented to person, place, and time. She appears well-developed and well-nourished.  HENT:  Head: Normocephalic and atraumatic.  Eyes: EOM are normal.  Neck: Normal range of motion.  Cardiovascular: Normal rate and regular rhythm.  Pulmonary/Chest: Effort normal and breath sounds normal. No respiratory distress. She has no wheezes. She has no rales.  Abdominal: Soft. Bowel sounds are normal. She exhibits no distension. There is no tenderness. There is no rebound.  Musculoskeletal: She exhibits no edema.  Neurological: She is alert and oriented to person, place, and time. Coordination normal.  Skin: Skin is warm and dry.  Psychiatric:  Some flat affect   Vitals:   01/09/18 1018  BP: 120/80  Pulse: 83  Temp: 98.6 F (37 C)  TempSrc: Oral  SpO2: 98%  Weight: 188 lb (85.3 kg)  Height: 5\' 4"  (1.626 m)      Assessment & Plan:  Flu shot given at visit

## 2018-01-10 DIAGNOSIS — R69 Illness, unspecified: Secondary | ICD-10-CM | POA: Diagnosis not present

## 2018-01-10 NOTE — Assessment & Plan Note (Signed)
Flu shot given. Pneumonia not indicated. Shingrix given 1st today. Tetanus up to date. Colonoscopy up to date. Mammogram scheduled later this month, pap smear up to date with gyn. Counseled about sun safety and mole surveillance. Counseled about the dangers of distracted driving. Given 10 year screening recommendations.

## 2018-01-10 NOTE — Assessment & Plan Note (Signed)
Breathing stable without meds currently. Still smoking and reminded that continued smoking will worsen her lung disease with time.

## 2018-01-10 NOTE — Assessment & Plan Note (Signed)
No symptoms currently but we talked about how smoking does significantly worsen PVD long term and increase her risk for CV disease.

## 2018-01-10 NOTE — Assessment & Plan Note (Signed)
Treatment through psych and has been in mental health hospital since last visit and is still not stabilized.

## 2018-01-10 NOTE — Assessment & Plan Note (Signed)
Time spent counseling about tobacco usage: 4 minutes. I have asked about smoking and is smoking same as usual. The patient is advised to quit. The patient is not willing to quit. They would like to try to quit in the next 6 months. We will follow up with them in 6 months.  

## 2018-01-11 DIAGNOSIS — R69 Illness, unspecified: Secondary | ICD-10-CM | POA: Diagnosis not present

## 2018-01-12 DIAGNOSIS — R69 Illness, unspecified: Secondary | ICD-10-CM | POA: Diagnosis not present

## 2018-01-15 DIAGNOSIS — S52552D Other extraarticular fracture of lower end of left radius, subsequent encounter for closed fracture with routine healing: Secondary | ICD-10-CM | POA: Diagnosis not present

## 2018-01-15 DIAGNOSIS — R69 Illness, unspecified: Secondary | ICD-10-CM | POA: Diagnosis not present

## 2018-01-15 DIAGNOSIS — S6982XD Other specified injuries of left wrist, hand and finger(s), subsequent encounter: Secondary | ICD-10-CM | POA: Diagnosis not present

## 2018-01-15 DIAGNOSIS — S52612D Displaced fracture of left ulna styloid process, subsequent encounter for closed fracture with routine healing: Secondary | ICD-10-CM | POA: Diagnosis not present

## 2018-01-16 ENCOUNTER — Other Ambulatory Visit: Payer: Self-pay | Admitting: Orthopedic Surgery

## 2018-01-16 DIAGNOSIS — S52612D Displaced fracture of left ulna styloid process, subsequent encounter for closed fracture with routine healing: Secondary | ICD-10-CM

## 2018-01-16 DIAGNOSIS — R69 Illness, unspecified: Secondary | ICD-10-CM | POA: Diagnosis not present

## 2018-01-16 DIAGNOSIS — S6982XD Other specified injuries of left wrist, hand and finger(s), subsequent encounter: Secondary | ICD-10-CM

## 2018-01-16 DIAGNOSIS — S52552D Other extraarticular fracture of lower end of left radius, subsequent encounter for closed fracture with routine healing: Secondary | ICD-10-CM

## 2018-01-17 DIAGNOSIS — R69 Illness, unspecified: Secondary | ICD-10-CM | POA: Diagnosis not present

## 2018-01-22 DIAGNOSIS — R69 Illness, unspecified: Secondary | ICD-10-CM | POA: Diagnosis not present

## 2018-01-23 ENCOUNTER — Ambulatory Visit
Admission: RE | Admit: 2018-01-23 | Discharge: 2018-01-23 | Disposition: A | Discharge status: Home / Self Care | Payer: Medicare HMO | Source: Ambulatory Visit | Attending: Gynecology | Admitting: Gynecology

## 2018-01-23 DIAGNOSIS — R69 Illness, unspecified: Secondary | ICD-10-CM | POA: Diagnosis not present

## 2018-01-23 DIAGNOSIS — Z1231 Encounter for screening mammogram for malignant neoplasm of breast: Secondary | ICD-10-CM

## 2018-01-24 ENCOUNTER — Ambulatory Visit
Admission: RE | Admit: 2018-01-24 | Discharge: 2018-01-24 | Disposition: A | Discharge status: Home / Self Care | Payer: Medicare HMO | Source: Ambulatory Visit | Attending: Orthopedic Surgery | Admitting: Orthopedic Surgery

## 2018-01-24 DIAGNOSIS — S52552D Other extraarticular fracture of lower end of left radius, subsequent encounter for closed fracture with routine healing: Secondary | ICD-10-CM

## 2018-01-24 DIAGNOSIS — S52612D Displaced fracture of left ulna styloid process, subsequent encounter for closed fracture with routine healing: Secondary | ICD-10-CM

## 2018-01-24 DIAGNOSIS — S6982XD Other specified injuries of left wrist, hand and finger(s), subsequent encounter: Secondary | ICD-10-CM

## 2018-01-24 DIAGNOSIS — M25432 Effusion, left wrist: Secondary | ICD-10-CM | POA: Diagnosis not present

## 2018-01-24 DIAGNOSIS — R69 Illness, unspecified: Secondary | ICD-10-CM | POA: Diagnosis not present

## 2018-01-24 MED ORDER — IOPAMIDOL (ISOVUE-M 200) INJECTION 41%
2.0000 mL | Freq: Once | INTRAMUSCULAR | Status: AC
  Administered 2018-01-24: 2 mL via INTRA_ARTICULAR

## 2018-01-25 DIAGNOSIS — R69 Illness, unspecified: Secondary | ICD-10-CM | POA: Diagnosis not present

## 2018-01-26 DIAGNOSIS — R69 Illness, unspecified: Secondary | ICD-10-CM | POA: Diagnosis not present

## 2018-01-29 DIAGNOSIS — S52552D Other extraarticular fracture of lower end of left radius, subsequent encounter for closed fracture with routine healing: Secondary | ICD-10-CM | POA: Diagnosis not present

## 2018-01-29 DIAGNOSIS — R69 Illness, unspecified: Secondary | ICD-10-CM | POA: Diagnosis not present

## 2018-01-29 DIAGNOSIS — S6982XD Other specified injuries of left wrist, hand and finger(s), subsequent encounter: Secondary | ICD-10-CM | POA: Diagnosis not present

## 2018-01-30 DIAGNOSIS — R69 Illness, unspecified: Secondary | ICD-10-CM | POA: Diagnosis not present

## 2018-02-01 ENCOUNTER — Other Ambulatory Visit: Payer: Self-pay | Admitting: Orthopedic Surgery

## 2018-02-02 ENCOUNTER — Ambulatory Visit (INDEPENDENT_AMBULATORY_CARE_PROVIDER_SITE_OTHER): Payer: Medicare HMO | Admitting: Family

## 2018-02-02 ENCOUNTER — Encounter: Payer: Self-pay | Admitting: Family

## 2018-02-02 VITALS — BP 128/76 | HR 81 | Temp 98.6°F | Ht 64.0 in | Wt 187.0 lb

## 2018-02-02 DIAGNOSIS — J209 Acute bronchitis, unspecified: Secondary | ICD-10-CM | POA: Diagnosis not present

## 2018-02-02 DIAGNOSIS — J019 Acute sinusitis, unspecified: Secondary | ICD-10-CM

## 2018-02-02 DIAGNOSIS — R69 Illness, unspecified: Secondary | ICD-10-CM | POA: Diagnosis not present

## 2018-02-02 MED ORDER — FLUTICASONE PROPIONATE 50 MCG/ACT NA SUSP: 2.0000 | Freq: Every day | NASAL | 6 refills | Status: DC

## 2018-02-02 MED ORDER — DOXYCYCLINE HYCLATE 100 MG PO TABS: 100.0000 mg | ORAL_TABLET | Freq: Two times a day (BID) | ORAL | 0 refills | Status: DC

## 2018-02-02 MED ORDER — FLUTICASONE FUROATE-VILANTEROL 100-25 MCG/INH IN AEPB: 1.0000 | INHALATION_SPRAY | Freq: Every day | RESPIRATORY_TRACT | Status: DC

## 2018-02-02 NOTE — Progress Notes (Signed)
Crystal Duarte is a 57 y.o. female with the following history as recorded in Epic[employer]:  Patient Active Problem List   Diagnosis Date Noted  . Suicidal ideation 11/17/2017  . Severe episode of recurrent major depressive disorder, without psychotic features ([place]) 07/18/2017  . COPD GOLD II  12/26/2016  . Orthostatic hypotension 12/14/2016  . Routine general medical examination at a health care facility 10/09/2015  . Memory loss 10/04/2014  . Affective bipolar disorder ([place]) 03/12/2014  . Peripheral vascular disease, unspecified ([place]) 10/10/2013  . Smoker 08/28/2012  . Multiple pulmonary nodules 11/12/2010    Current Outpatient Medications  Medication Sig Dispense Refill  . busPIRone (BUSPAR) 10 MG tablet     . capsaicin (ZOSTRIX) 0.025 % cream Apply topically 2 (two) times daily. Affected area 60 g 0  . Cholecalciferol (VITAMIN D3) 2000 units capsule Take by mouth.    . diclofenac sodium (VOLTAREN) 1 % GEL Apply 2 g topically 4 (four) times daily. 1 Tube 0  . gabapentin (NEURONTIN) 400 MG capsule Take 1 capsule (400 mg total) by mouth 3 (three) times daily. (Patient taking differently: Take 400 mg by mouth 2 (two) times daily. ) 90 capsule 0  . hydrOXYzine (ATARAX/VISTARIL) 25 MG tablet Take 1 tablet (25 mg total) by mouth 3 (three) times daily as needed for anxiety. 90 tablet 0  . hydrOXYzine (VISTARIL) 25 MG capsule TAKE ONE CAPSULE BY MOUTH 3 TIMES A DAY AS NEEDED FOR ANXIETY  0  . Multiple Vitamin (MULTI-VITAMINS) TABS Take by mouth.    . propranolol (INDERAL) 20 MG tablet Take 1 tablet (20 mg total) by mouth 3 (three) times daily. (Patient taking differently: Take 10 mg by mouth 3 (three) times daily. ) 90 tablet 0  . traZODone (DESYREL) 100 MG tablet Take 2 tablets (200 mg total) by mouth at bedtime as needed for sleep. (Patient taking differently: Take 250 mg by mouth at bedtime as needed for sleep. ) 60 tablet 0  . venlafaxine XR (EFFEXOR-XR) 150 MG 24 hr capsule Take 150 mg by  mouth daily with breakfast.    . busPIRone (BUSPAR) 15 MG tablet Take 1 tablet (15 mg total) by mouth 3 (three) times daily. (Patient not taking: Reported on 02/02/2018) 90 tablet 0  . doxycycline (VIBRA-TABS) 100 MG tablet Take 1 tablet (100 mg total) by mouth 2 (two) times daily. 20 tablet 0  . fluticasone (FLONASE) 50 MCG/ACT nasal spray Place 2 sprays into both nostrils daily. 16 g 6  . fluticasone furoate-vilanterol (BREO ELLIPTA) 100-25 MCG/INH AEPB Inhale 1 puff into the lungs daily.    Marland Kitchen gabapentin (NEURONTIN) 600 MG tablet      No current facility-administered medications for this visit.     Allergies: Latuda [lurasidone hcl] and Sulfa antibiotics  Past Medical History:  Diagnosis Date  . Coccidioidomycosis, pulmonary ([place])   . Depression   . Hx of adenomatous polyp of colon 12/04/2014  . Osteopenia 11/2016   T score -2.2 FRAX 17%/2.4%  . Panic attack   . VAIN I (vaginal intraepithelial neoplasia grade I) 2015   Positive HPV negative subtype 16, 18/45    Past Surgical History:  Procedure Laterality Date  . ABDOMINAL HYSTERECTOMY     TAH BSO  . BREAST EXCISIONAL BIOPSY Right    benign  . COLONOSCOPY  2001   hemorrhoidectomy  . LUNG SURGERY  2001   VATS surgery   . OVARIAN CYST REMOVAL  1970's  . VESICOVAGINAL FISTULA CLOSURE W/ TAH  2005  Family History  Problem Relation Age of Onset  . Asthma Mother   . Ovarian cancer Mother   . Cancer Mother        OVARIAN  . Varicose Veins Mother   . Heart disease Father   . Peripheral vascular disease Father   . Cancer Maternal Grandmother        LUNG- LUNG  . Colon cancer Neg Hx     Social History   Tobacco Use  . Smoking status: Current Some Day Smoker    Packs/day: 1.00    Years: 42.00    Pack years: 42.00    Types: Cigarettes    Last attempt to quit: 09/25/2016    Years since quitting: 1.3  . Smokeless tobacco: Former Systems developer    Quit date: 06/13/2016  Substance Use Topics  . Alcohol use: No    Alcohol/week: 0.0  standard drinks    Subjective:  Patient presents with concerns for sinus pain/ pressure; symptoms x 3 weeks; + facial pain/ pressure; no relief with OTC medications; + deep cough; + prone to bronchitis;    Objective:  Vitals:   02/02/18 1307  BP: 128/76  Pulse: 81  Temp: 98.6 F (37 C)  TempSrc: Oral  SpO2: 95%  Weight: 187 lb 0.6 oz (84.8 kg)  Height: 5\' 4"  (1.626 m)    General: Well developed, well nourished, in no acute distress  Skin : Warm and dry.  Head: Normocephalic and atraumatic  Eyes: Sclera and conjunctiva clear; pupils round and reactive to light; extraocular movements intact  Ears: External normal; canals clear; tympanic membranes normal  Oropharynx: Pink, supple. No suspicious lesions  Neck: Supple without thyromegaly, adenopathy  Lungs: Respirations unlabored; wheezing in all 4 lobes;  CVS exam: normal rate and regular rhythm.  Neurologic: Alert and oriented; speech intact; face symmetrical; moves all extremities well; CNII-XII intact without focal deficit   Assessment:  1. Acute sinusitis, recurrence not specified, unspecified location   2. Acute bronchitis, unspecified organism     Plan:  Rx for Doxycycline 100 mg bid x 10 days, Flonase, Sample of BREO 100 mg qd x 10 days; increase fluids, rest and follow-up worse, no better.   No follow-ups on file.  No orders of the defined types were placed in this encounter.   Requested Prescriptions   Signed Prescriptions Disp Refills  . doxycycline (VIBRA-TABS) 100 MG tablet 20 tablet 0    Sig: Take 1 tablet (100 mg total) by mouth 2 (two) times daily.  . fluticasone (FLONASE) 50 MCG/ACT nasal spray 16 g 6    Sig: Place 2 sprays into both nostrils daily.  . fluticasone furoate-vilanterol (BREO ELLIPTA) 100-25 MCG/INH AEPB      Sig: Inhale 1 puff into the lungs daily.

## 2018-02-05 DIAGNOSIS — R69 Illness, unspecified: Secondary | ICD-10-CM | POA: Diagnosis not present

## 2018-02-08 ENCOUNTER — Telehealth: Payer: Self-pay | Admitting: Internal Medicine

## 2018-02-08 NOTE — Telephone Encounter (Signed)
Copied from [place] [id number]. Topic: General - Inquiry >> Feb 08, 2018 10:55 AM Margot Ables wrote: Reason for CRM: Pt is still having nasal congestion, deep cough, and wheezing as she did at [place] 11/1 with Jodi Mourning, NP. She feels like the ABX given are not working. She is also using the sample inhaler that was given (Breo- 7 doses left). She is using the flonase as well. Pt is asking for advice on medications or possible change of medication.  [facility] [address], [place] [phone] (Phone) [phone] (Fax)

## 2018-02-09 NOTE — Telephone Encounter (Signed)
Pt called to follow up on request for something to be sent in. Advised we are waiting for provider response.

## 2018-02-09 NOTE — Telephone Encounter (Signed)
Spoke with patient today. Appointment times offered to her today but she was unable to make it. Offered Sat clinic as well but patient has decided to wait until Monday to follow up with Dr. Sharlet Salina for recheck.

## 2018-02-09 NOTE — Telephone Encounter (Signed)
She needs to be seen again; may need to consider a CXR also.

## 2018-02-12 ENCOUNTER — Ambulatory Visit (INDEPENDENT_AMBULATORY_CARE_PROVIDER_SITE_OTHER): Payer: Medicare HMO | Admitting: Internal Medicine

## 2018-02-12 ENCOUNTER — Ambulatory Visit (INDEPENDENT_AMBULATORY_CARE_PROVIDER_SITE_OTHER)
Admission: RE | Admit: 2018-02-12 | Discharge: 2018-02-12 | Disposition: A | Discharge status: Home / Self Care | Payer: Medicare HMO | Source: Ambulatory Visit | Attending: Internal Medicine | Admitting: Internal Medicine

## 2018-02-12 ENCOUNTER — Encounter: Payer: Self-pay | Admitting: Internal Medicine

## 2018-02-12 VITALS — BP 110/80 | HR 71 | Temp 98.2°F | Ht 64.0 in | Wt 186.0 lb

## 2018-02-12 DIAGNOSIS — J441 Chronic obstructive pulmonary disease with (acute) exacerbation: Secondary | ICD-10-CM

## 2018-02-12 DIAGNOSIS — R05 Cough: Secondary | ICD-10-CM | POA: Diagnosis not present

## 2018-02-12 MED ORDER — IPRATROPIUM-ALBUTEROL 0.5-2.5 (3) MG/3ML IN SOLN
3.0000 mL | Freq: Once | RESPIRATORY_TRACT | Status: AC
  Administered 2018-02-12: 3 mL via RESPIRATORY_TRACT

## 2018-02-12 MED ORDER — PREDNISONE 20 MG PO TABS: 40.0000 mg | ORAL_TABLET | Freq: Every day | ORAL | 0 refills | Status: AC

## 2018-02-12 MED ORDER — HYDROCODONE-HOMATROPINE 5-1.5 MG/5ML PO SYRP: 5.0000 mL | ORAL_SOLUTION | Freq: Every evening | ORAL | 0 refills | Status: DC | PRN

## 2018-02-12 MED ORDER — ALBUTEROL SULFATE HFA 108 (90 BASE) MCG/ACT IN AERS: 2.0000 | INHALATION_SPRAY | Freq: Four times a day (QID) | RESPIRATORY_TRACT | 2 refills | Status: DC | PRN

## 2018-02-12 NOTE — Assessment & Plan Note (Signed)
Given duo-neb in the office for wheezing on exam and severe flare with persistent symptoms. Rx for prednisone and checking CXR today. If any findings treat as appropriate. Will continue flonase and rx for albuterol inhaler to use for SOB.

## 2018-02-12 NOTE — Patient Instructions (Signed)
We have sent in prednisone to take 2 pills daily for 1 week.   We have sent in albuterol inhaler to use if needed for shortness of breath.  We have sent in cough medicine to use at night time if needed.   We are doing the chest x-ray today.

## 2018-02-12 NOTE — Progress Notes (Signed)
   Subjective:    Patient ID: Crystal Duarte, female    DOB: Sep 11, 1960, 57 y.o.   MRN: [id number]  HPI The patient is a 57 YO female coming in for persistent cough and SOB. Started about 2 weeks ago and she came in. Was given rx for doxycycline and sample of breo and given flonase. She has not been able to tell a difference with breo. The flonase she is using and that is helping some with sinus symptoms. She is just getting more SOB and cannot even walk 100 feet without getting winded. She denies fevers but some chills. She is coughing up white sputum which is stable. She took all the antibiotic and this did not help. She is a smoker and still smoking (although not the last week or so). Coughing is hurting and deep breathing is hurting on the right side more. She has had low energy. Still sinus drainage and pressure right sinus.   Review of Systems  Constitutional: Positive for activity change, appetite change and chills. Negative for fatigue, fever and unexpected weight change.  HENT: Positive for congestion, postnasal drip, rhinorrhea and sinus pressure. Negative for ear discharge, ear pain, sinus pain, sneezing, sore throat, tinnitus, trouble swallowing and voice change.   Eyes: Negative.   Respiratory: Positive for cough, chest tightness, shortness of breath and wheezing.   Cardiovascular: Positive for chest pain. Negative for palpitations and leg swelling.  Gastrointestinal: Negative.   Musculoskeletal: Positive for myalgias.  Neurological: Negative.       Objective:   Physical Exam  Constitutional: She is oriented to person, place, and time. She appears well-developed and well-nourished.  HENT:  Head: Normocephalic and atraumatic.  Oropharynx with redness and clear drainage, nose with swollen turbinates, TMs normal bilaterally  Eyes: EOM are normal.  Neck: Normal range of motion. No thyromegaly present.  Cardiovascular: Normal rate and regular rhythm.  Pulmonary/Chest: Effort  normal. No respiratory distress. She has wheezes. She has no rales.  Initial lung exam with wheezing on the right, after duo-neb bilateral wheezing with some scattered rhonchi  Abdominal: Soft.  Musculoskeletal: She exhibits tenderness.  Lymphadenopathy:    She has no cervical adenopathy.  Neurological: She is alert and oriented to person, place, and time.  Skin: Skin is warm and dry.   Vitals:   02/12/18 1310  BP: 110/80  Pulse: 71  Temp: 98.2 F (36.8 C)  TempSrc: Oral  SpO2: 96%  Weight: 186 lb (84.4 kg)  Height: 5\' 4"  (1.626 m)   Duoneb given in office    Assessment & Plan:

## 2018-02-14 DIAGNOSIS — R69 Illness, unspecified: Secondary | ICD-10-CM | POA: Diagnosis not present

## 2018-02-19 DIAGNOSIS — R69 Illness, unspecified: Secondary | ICD-10-CM | POA: Diagnosis not present

## 2018-02-19 DIAGNOSIS — F411 Generalized anxiety disorder: Secondary | ICD-10-CM | POA: Diagnosis not present

## 2018-02-26 DIAGNOSIS — S52552D Other extraarticular fracture of lower end of left radius, subsequent encounter for closed fracture with routine healing: Secondary | ICD-10-CM | POA: Diagnosis not present

## 2018-02-26 DIAGNOSIS — S6982XD Other specified injuries of left wrist, hand and finger(s), subsequent encounter: Secondary | ICD-10-CM | POA: Diagnosis not present

## 2018-02-27 ENCOUNTER — Other Ambulatory Visit: Payer: Self-pay

## 2018-02-27 ENCOUNTER — Encounter (HOSPITAL_BASED_OUTPATIENT_CLINIC_OR_DEPARTMENT_OTHER): Payer: Self-pay | Admitting: *Deleted

## 2018-02-27 NOTE — Progress Notes (Signed)
Pt states she is a very difficult IV start, requiring multiple sticks, would like to discuss with anesthesia prior to IV start

## 2018-03-05 ENCOUNTER — Encounter (HOSPITAL_BASED_OUTPATIENT_CLINIC_OR_DEPARTMENT_OTHER)
Admission: RE | Admit: 2018-03-05 | Discharge: 2018-03-05 | Disposition: A | Discharge status: Home / Self Care | Payer: Medicare HMO | Source: Ambulatory Visit | Attending: Orthopedic Surgery | Admitting: Orthopedic Surgery

## 2018-03-05 DIAGNOSIS — X58XXXD Exposure to other specified factors, subsequent encounter: Secondary | ICD-10-CM | POA: Diagnosis not present

## 2018-03-05 DIAGNOSIS — Z01818 Encounter for other preprocedural examination: Secondary | ICD-10-CM | POA: Insufficient documentation

## 2018-03-05 DIAGNOSIS — S52502D Unspecified fracture of the lower end of left radius, subsequent encounter for closed fracture with routine healing: Secondary | ICD-10-CM | POA: Diagnosis not present

## 2018-03-05 LAB — BASIC METABOLIC PANEL
Anion gap: 11 (ref 5–15)
BUN: 11 mg/dL (ref 6–20)
CO2: 27 mmol/L (ref 22–32)
Calcium: 10 mg/dL (ref 8.9–10.3)
Chloride: 100 mmol/L (ref 98–111)
Creatinine, Ser: 0.88 mg/dL (ref 0.44–1.00)
GFR calc Af Amer: >60 mL/min (ref >60)
GFR calc non Af Amer: >60 mL/min (ref >60)
Glucose, Bld: 94 mg/dL (ref 70–99)
Potassium: 5 mmol/L (ref 3.5–5.1)
Sodium: 138 mmol/L (ref 135–145)

## 2018-03-13 ENCOUNTER — Other Ambulatory Visit: Payer: Self-pay

## 2018-03-13 ENCOUNTER — Ambulatory Visit (HOSPITAL_BASED_OUTPATIENT_CLINIC_OR_DEPARTMENT_OTHER)
Admission: RE | Admit: 2018-03-13 | Discharge: 2018-03-13 | Disposition: A | Discharge status: Home / Self Care | Payer: Medicare HMO | Attending: Orthopedic Surgery | Admitting: Orthopedic Surgery

## 2018-03-13 ENCOUNTER — Ambulatory Visit: Payer: Self-pay

## 2018-03-13 ENCOUNTER — Ambulatory Visit (HOSPITAL_BASED_OUTPATIENT_CLINIC_OR_DEPARTMENT_OTHER): Payer: Medicare HMO | Admitting: Anesthesiology

## 2018-03-13 ENCOUNTER — Encounter (HOSPITAL_BASED_OUTPATIENT_CLINIC_OR_DEPARTMENT_OTHER)
Admission: RE | Disposition: A | Discharge status: Home / Self Care | Payer: Self-pay | Source: Home / Self Care | Attending: Orthopedic Surgery

## 2018-03-13 ENCOUNTER — Encounter (HOSPITAL_BASED_OUTPATIENT_CLINIC_OR_DEPARTMENT_OTHER): Payer: Self-pay

## 2018-03-13 DIAGNOSIS — G8918 Other acute postprocedural pain: Secondary | ICD-10-CM | POA: Diagnosis not present

## 2018-03-13 DIAGNOSIS — F329 Major depressive disorder, single episode, unspecified: Secondary | ICD-10-CM | POA: Insufficient documentation

## 2018-03-13 DIAGNOSIS — S63592A Other specified sprain of left wrist, initial encounter: Secondary | ICD-10-CM | POA: Diagnosis not present

## 2018-03-13 DIAGNOSIS — F17210 Nicotine dependence, cigarettes, uncomplicated: Secondary | ICD-10-CM | POA: Diagnosis not present

## 2018-03-13 DIAGNOSIS — X58XXXA Exposure to other specified factors, initial encounter: Secondary | ICD-10-CM | POA: Diagnosis not present

## 2018-03-13 DIAGNOSIS — Z882 Allergy status to sulfonamides status: Secondary | ICD-10-CM | POA: Insufficient documentation

## 2018-03-13 DIAGNOSIS — R69 Illness, unspecified: Secondary | ICD-10-CM | POA: Diagnosis not present

## 2018-03-13 DIAGNOSIS — S63512A Sprain of carpal joint of left wrist, initial encounter: Secondary | ICD-10-CM | POA: Diagnosis not present

## 2018-03-13 DIAGNOSIS — Z79899 Other long term (current) drug therapy: Secondary | ICD-10-CM | POA: Diagnosis not present

## 2018-03-13 HISTORY — PX: WRIST ARTHROSCOPY: SHX838

## 2018-03-13 HISTORY — DX: Major depressive disorder, single episode, unspecified: F32.9

## 2018-03-13 HISTORY — DX: Nausea with vomiting, unspecified: R11.2

## 2018-03-13 HISTORY — DX: Other specified postprocedural states: Z98.890

## 2018-03-13 SURGERY — ARTHROSCOPY, WRIST
Anesthesia: General | Site: Wrist | Laterality: Left

## 2018-03-13 MED ORDER — PROPOFOL 10 MG/ML IV BOLUS
INTRAVENOUS | Status: AC
  Filled 2018-03-13: qty 20

## 2018-03-13 MED ORDER — DEXAMETHASONE SODIUM PHOSPHATE 10 MG/ML IJ SOLN
INTRAMUSCULAR | Status: AC
  Filled 2018-03-13: qty 1

## 2018-03-13 MED ORDER — DEXAMETHASONE SODIUM PHOSPHATE 4 MG/ML IJ SOLN
INTRAMUSCULAR | Status: DC | PRN
  Administered 2018-03-13: 10 mg via INTRAVENOUS

## 2018-03-13 MED ORDER — MIDAZOLAM HCL 2 MG/2ML IJ SOLN
INTRAMUSCULAR | Status: AC
  Filled 2018-03-13: qty 2

## 2018-03-13 MED ORDER — CEFAZOLIN SODIUM-DEXTROSE 2-4 GM/100ML-% IV SOLN
2.0000 g | INTRAVENOUS | Status: AC
  Administered 2018-03-13: 2 g via INTRAVENOUS

## 2018-03-13 MED ORDER — LACTATED RINGERS IV SOLN
INTRAVENOUS | Status: DC
  Administered 2018-03-13: 13:00:00 via INTRAVENOUS

## 2018-03-13 MED ORDER — BUPIVACAINE-EPINEPHRINE (PF) 0.5% -1:200000 IJ SOLN
INTRAMUSCULAR | Status: DC | PRN
  Administered 2018-03-13: 25 mL via PERINEURAL

## 2018-03-13 MED ORDER — FENTANYL CITRATE (PF) 100 MCG/2ML IJ SOLN: 25.0000 ug | INTRAMUSCULAR | Status: DC | PRN

## 2018-03-13 MED ORDER — PROPOFOL 10 MG/ML IV BOLUS
INTRAVENOUS | Status: DC | PRN
  Administered 2018-03-13: 200 mg via INTRAVENOUS

## 2018-03-13 MED ORDER — CLONIDINE HCL (ANALGESIA) 100 MCG/ML EP SOLN
EPIDURAL | Status: DC | PRN
  Administered 2018-03-13: 50 ug

## 2018-03-13 MED ORDER — ONDANSETRON HCL 4 MG/2ML IJ SOLN: 4.0000 mg | Freq: Once | INTRAMUSCULAR | Status: DC | PRN

## 2018-03-13 MED ORDER — LIDOCAINE 2% (20 MG/ML) 5 ML SYRINGE
INTRAMUSCULAR | Status: AC
  Filled 2018-03-13: qty 5

## 2018-03-13 MED ORDER — ONDANSETRON HCL 4 MG/2ML IJ SOLN
INTRAMUSCULAR | Status: AC
  Filled 2018-03-13: qty 2

## 2018-03-13 MED ORDER — CHLORHEXIDINE GLUCONATE 4 % EX LIQD: 60.0000 mL | Freq: Once | CUTANEOUS | Status: DC

## 2018-03-13 MED ORDER — CEFAZOLIN SODIUM-DEXTROSE 2-4 GM/100ML-% IV SOLN
INTRAVENOUS | Status: AC
  Filled 2018-03-13: qty 100

## 2018-03-13 MED ORDER — SCOPOLAMINE 1 MG/3DAYS TD PT72: 1.0000 | MEDICATED_PATCH | Freq: Once | TRANSDERMAL | Status: DC | PRN

## 2018-03-13 MED ORDER — FENTANYL CITRATE (PF) 100 MCG/2ML IJ SOLN
INTRAMUSCULAR | Status: AC
  Filled 2018-03-13: qty 2

## 2018-03-13 MED ORDER — FENTANYL CITRATE (PF) 100 MCG/2ML IJ SOLN
50.0000 ug | INTRAMUSCULAR | Status: DC | PRN
  Administered 2018-03-13: 100 ug via INTRAVENOUS

## 2018-03-13 MED ORDER — HYDROCODONE-ACETAMINOPHEN 5-325 MG PO TABS: ORAL_TABLET | ORAL | 0 refills | Status: DC

## 2018-03-13 MED ORDER — MIDAZOLAM HCL 2 MG/2ML IJ SOLN
1.0000 mg | INTRAMUSCULAR | Status: DC | PRN
  Administered 2018-03-13: 2 mg via INTRAVENOUS

## 2018-03-13 SURGICAL SUPPLY — 75 items
BANDAGE ACE 3X5.8 VEL STRL LF (GAUZE/BANDAGES/DRESSINGS) ×2 IMPLANT
BANDAGE ACE 4X5 VEL STRL LF (GAUZE/BANDAGES/DRESSINGS) IMPLANT
BLADE CUDA 2.0 (BLADE) IMPLANT
BLADE EAR TYMPAN 2.5 60D BEAV (BLADE) IMPLANT
BLADE MINI RND TIP GREEN BEAV (BLADE) IMPLANT
BLADE SURG 15 STRL LF DISP TIS (BLADE) ×1 IMPLANT
BLADE SURG 15 STRL SS (BLADE) ×1
BNDG ESMARK 4X9 LF (GAUZE/BANDAGES/DRESSINGS) IMPLANT
BNDG GAUZE ELAST 4 BULKY (GAUZE/BANDAGES/DRESSINGS) IMPLANT
BUR CUDA 2.9 (BURR) IMPLANT
BUR FULL RADIUS 2.0 (BURR) IMPLANT
BUR FULL RADIUS 2.9 (BURR) IMPLANT
BUR GATOR 2.9 (BURR) IMPLANT
BUR SPHERICAL 2.9 (BURR) IMPLANT
CANISTER SUCT 1200ML W/VALVE (MISCELLANEOUS) IMPLANT
CHLORAPREP W/TINT 26ML (MISCELLANEOUS) ×2 IMPLANT
CORD BIPOLAR FORCEPS 12FT (ELECTRODE) IMPLANT
COVER BACK TABLE 60X90IN (DRAPES) ×2 IMPLANT
COVER WAND RF STERILE (DRAPES) IMPLANT
CUFF TOURNIQUET SINGLE 18IN (TOURNIQUET CUFF) ×2 IMPLANT
DRAPE EXTREMITY T 121X128X90 (DRAPE) ×2 IMPLANT
DRAPE IMP U-DRAPE 54X76 (DRAPES) ×2 IMPLANT
DRAPE OEC MINIVIEW 54X84 (DRAPES) IMPLANT
DRAPE SURG 17X23 STRL (DRAPES) ×2 IMPLANT
ELECT SMALL JOINT 90D BASC (ELECTRODE) IMPLANT
GAUZE SPONGE 4X4 12PLY STRL (GAUZE/BANDAGES/DRESSINGS) ×2 IMPLANT
GAUZE XEROFORM 1X8 LF (GAUZE/BANDAGES/DRESSINGS) ×2 IMPLANT
GLOVE BIO SURGEON STRL SZ7.5 (GLOVE) ×2 IMPLANT
GLOVE BIOGEL PI IND STRL 7.0 (GLOVE) ×1 IMPLANT
GLOVE BIOGEL PI IND STRL 8 (GLOVE) ×1 IMPLANT
GLOVE BIOGEL PI IND STRL 8.5 (GLOVE) IMPLANT
GLOVE BIOGEL PI INDICATOR 7.0 (GLOVE) ×1
GLOVE BIOGEL PI INDICATOR 8 (GLOVE) ×1
GLOVE BIOGEL PI INDICATOR 8.5 (GLOVE)
GLOVE SURG ORTHO 8.0 STRL STRW (GLOVE) ×2 IMPLANT
GLOVE SURG SYN 8.0 (GLOVE) ×2 IMPLANT
GOWN STRL REUS W/ TWL LRG LVL3 (GOWN DISPOSABLE) ×1 IMPLANT
GOWN STRL REUS W/TWL LRG LVL3 (GOWN DISPOSABLE) ×1
GOWN STRL REUS W/TWL XL LVL3 (GOWN DISPOSABLE) ×4 IMPLANT
IV SET EXT 30 76VOL 4 MALE LL (IV SETS) ×2 IMPLANT
NDL SAFETY ECLIPSE 18X1.5 (NEEDLE) ×1 IMPLANT
NEEDLE HYPO 18GX1.5 SHARP (NEEDLE) ×1
NEEDLE HYPO 22GX1.5 SAFETY (NEEDLE) ×2 IMPLANT
NEEDLE SPNL 18GX3.5 QUINCKE PK (NEEDLE) IMPLANT
NEEDLE TUOHY 20GX3.5 (NEEDLE) IMPLANT
PACK BASIN DAY SURGERY FS (CUSTOM PROCEDURE TRAY) ×2 IMPLANT
PAD CAST 3X4 CTTN HI CHSV (CAST SUPPLIES) ×1 IMPLANT
PADDING CAST ABS 3INX4YD NS (CAST SUPPLIES) ×1
PADDING CAST ABS 4INX4YD NS (CAST SUPPLIES) ×1
PADDING CAST ABS COTTON 3X4 (CAST SUPPLIES) ×1 IMPLANT
PADDING CAST ABS COTTON 4X4 ST (CAST SUPPLIES) ×1 IMPLANT
PADDING CAST COTTON 3X4 STRL (CAST SUPPLIES) ×1
PROBE BIPOLAR ARTHRO 85MM 30D (MISCELLANEOUS) IMPLANT
ROUTER HOODED VORTEX 2.9MM (BLADE) IMPLANT
SET SM JOINT TUBING/CANN (CANNULA) IMPLANT
SLEEVE SCD COMPRESS KNEE MED (MISCELLANEOUS) ×2 IMPLANT
SPLINT PLASTER CAST XFAST 3X15 (CAST SUPPLIES) IMPLANT
SPLINT PLASTER XTRA FASTSET 3X (CAST SUPPLIES)
STOCKINETTE 4X48 STRL (DRAPES) ×2 IMPLANT
STRIP CLOSURE SKIN 1/2X4 (GAUZE/BANDAGES/DRESSINGS) IMPLANT
SUCTION FRAZIER HANDLE 10FR (MISCELLANEOUS)
SUCTION TUBE FRAZIER 10FR DISP (MISCELLANEOUS) IMPLANT
SUT ETHILON 4 0 PS 2 18 (SUTURE) ×2 IMPLANT
SUT ETHILON 5 0 P 3 18 (SUTURE)
SUT NYLON ETHILON 5-0 P-3 1X18 (SUTURE) IMPLANT
SUT PDS AB 2-0 CT2 27 (SUTURE) IMPLANT
SUT STEEL 4 0 (SUTURE) IMPLANT
SUT VIC AB 2-0 PS2 27 (SUTURE) IMPLANT
SUT VICRYL 4-0 PS2 18IN ABS (SUTURE) IMPLANT
SYR BULB 3OZ (MISCELLANEOUS) ×2 IMPLANT
SYR CONTROL 10ML LL (SYRINGE) ×2 IMPLANT
TUBE CONNECTING 20X1/4 (TUBING) ×2 IMPLANT
TUBING ARTHRO INFLOW-ONLY STRL (TUBING) ×2 IMPLANT
UNDERPAD 30X30 (UNDERPADS AND DIAPERS) ×2 IMPLANT
WATER STERILE IRR 1000ML POUR (IV SOLUTION) ×2 IMPLANT

## 2018-03-13 NOTE — Anesthesia Procedure Notes (Signed)
Anesthesia Regional Block: Supraclavicular block   Pre-Anesthetic Checklist: ,, timeout performed, Correct Patient, Correct Site, Correct Laterality, Correct Procedure, Correct Position, site marked, Risks and benefits discussed,  Surgical consent,  Pre-op evaluation,  At surgeon's request and post-op pain management  Laterality: Left  Prep: chloraprep       Needles:  Injection technique: Single-shot  Needle Type: Echogenic Needle     Needle Length: 9cm  Needle Gauge: 21     Additional Needles:   Procedures:,,,, ultrasound used (permanent image in chart),,,,   Nerve Stimulator or Paresthesia:  Response: forearm/ wrist/ biceps, 0.5 mA,   Additional Responses:   Narrative:  Start time: 03/13/2018 1:02 PM End time: 03/13/2018 1:09 PM Injection made incrementally with aspirations every 5 mL.  Performed by: Personally  Anesthesiologist: Suzette Battiest, MD

## 2018-03-13 NOTE — Progress Notes (Signed)
Assisted Dr. Rodman Comp with left, ultrasound guided, supraclavicular block. Side rails up, monitors on throughout procedure. See vital signs in flow sheet. Tolerated Procedure well.

## 2018-03-13 NOTE — H&P (Signed)
Crystal Duarte is an 57 y.o. female.   Chief Complaint: left wrist pain HPI: 57 yo female s/p left distal radius fracture with continued left wrist ulnar sided pain.  She wishes to have left wrist arthroscopy with repair or debridement as necessary.  Allergies:  Allergies  Allergen Reactions  . Other Nausea And Vomiting    novacaine   . Latuda [Lurasidone Hcl] Anxiety  . Sulfa Antibiotics Rash    Only in sunlight     Past Medical History:  Diagnosis Date  . Coccidioidomycosis, pulmonary ([place])   . Depression   . Hx of adenomatous polyp of colon 12/04/2014  . Major depressive disorder    since 14, electroconvulsive therapy, and transcranial magnetic stimulation recently for 6 weeks every day   . Osteopenia 11/2016   T score -2.2 FRAX 17%/2.4%  . Panic attack   . PONV (postoperative nausea and vomiting)   . VAIN I (vaginal intraepithelial neoplasia grade I) 2015   Positive HPV negative subtype 16, 18/45    Past Surgical History:  Procedure Laterality Date  . ABDOMINAL HYSTERECTOMY     TAH BSO  . BILATERAL VATS ABLATION     vats for biopsy due to infection  . BREAST EXCISIONAL BIOPSY Right    benign  . COLONOSCOPY  2001   hemorrhoidectomy  . LUNG SURGERY  2001   VATS surgery   . OVARIAN CYST REMOVAL  1970's  . VESICOVAGINAL FISTULA CLOSURE W/ TAH  2005    Family History: Family History  Problem Relation Age of Onset  . Asthma Mother   . Ovarian cancer Mother   . Cancer Mother        OVARIAN  . Varicose Veins Mother   . Heart disease Father   . Peripheral vascular disease Father   . Cancer Maternal Grandmother        LUNG- LUNG  . Colon cancer Neg Hx     Social History:   reports that she has been smoking cigarettes. She has a 10.50 pack-year smoking history. She quit smokeless tobacco use about 20 months ago. She reports that she does not drink alcohol or use drugs.  Medications: Medications Prior to Admission  Medication Sig Dispense Refill  .  busPIRone (BUSPAR) 10 MG tablet 20 mg 3 (three) times daily.     . busPIRone (BUSPAR) 15 MG tablet Take 1 tablet (15 mg total) by mouth 3 (three) times daily. 90 tablet 0  . calcium carbonate (OSCAL) 1500 (600 Ca) MG TABS tablet Take 600 mg of elemental calcium by mouth daily with breakfast.    . Cholecalciferol (VITAMIN D3) 2000 units capsule Take by mouth.    . gabapentin (NEURONTIN) 400 MG capsule Take 1 capsule (400 mg total) by mouth 3 (three) times daily. (Patient taking differently: Take 400 mg by mouth 2 (two) times daily. ) 90 capsule 0  . gabapentin (NEURONTIN) 600 MG tablet     . Multiple Vitamin (MULTI-VITAMINS) TABS Take by mouth.    . propranolol (INDERAL) 20 MG tablet Take 1 tablet (20 mg total) by mouth 3 (three) times daily. (Patient taking differently: Take 10 mg by mouth 3 (three) times daily. ) 90 tablet 0  . traZODone (DESYREL) 100 MG tablet Take 2 tablets (200 mg total) by mouth at bedtime as needed for sleep. (Patient taking differently: Take 250 mg by mouth at bedtime as needed for sleep. ) 60 tablet 0  . venlafaxine XR (EFFEXOR-XR) 150 MG 24 hr capsule Take 150  mg by mouth daily with breakfast.    . albuterol (PROVENTIL HFA;VENTOLIN HFA) 108 (90 Base) MCG/ACT inhaler Inhale 2 puffs into the lungs every 6 (six) hours as needed for wheezing or shortness of breath. 1 Inhaler 2  . capsaicin (ZOSTRIX) 0.025 % cream Apply topically 2 (two) times daily. Affected area 60 g 0  . diclofenac sodium (VOLTAREN) 1 % GEL Apply 2 g topically 4 (four) times daily. 1 Tube 0  . fluticasone (FLONASE) 50 MCG/ACT nasal spray Place 2 sprays into both nostrils daily. 16 g 6  . fluticasone furoate-vilanterol (BREO ELLIPTA) 100-25 MCG/INH AEPB Inhale 1 puff into the lungs daily.    Marland Kitchen HYDROcodone-homatropine (HYCODAN) 5-1.5 MG/5ML syrup Take 5-10 mLs by mouth at bedtime as needed for cough. 50 mL 0  . hydrOXYzine (ATARAX/VISTARIL) 25 MG tablet Take 1 tablet (25 mg total) by mouth 3 (three) times daily  as needed for anxiety. 90 tablet 0  . hydrOXYzine (VISTARIL) 25 MG capsule TAKE ONE CAPSULE BY MOUTH 3 TIMES A DAY AS NEEDED FOR ANXIETY  0    No results found for this or any previous visit (from the past 48 hour(s)).  No results found.   A comprehensive review of systems was negative.  Blood pressure 116/75, pulse 87, temperature 98.1 F (36.7 C), temperature source Oral, resp. rate 18, height 5\' 5"  (1.651 m), weight 85.5 kg, SpO2 95 %.  General appearance: alert, cooperative and appears stated age Head: Normocephalic, without obvious abnormality, atraumatic Neck: supple, symmetrical, trachea midline Cardio: regular rate and rhythm Resp: clear to auscultation bilaterally Extremities: Intact sensation and capillary refill all digits.  +epl/fpl/io.  No wounds.  Pulses: 2+ and symmetric Skin: Skin color, texture, turgor normal. No rashes or lesions Neurologic: Grossly normal Incision/Wound: none  Assessment/Plan Left wrist pain with possible TFCC injury.  Plan wrist arthroscopy with debridement vs repair tfcc.  Non operative and operative treatment options have been discussed with the patient and patient wishes to proceed with operative treatment. Risks, benefits, and alternatives of surgery have been discussed and the patient agrees with the plan of care.   Leanora Cover 03/13/2018, 12:33 PM

## 2018-03-13 NOTE — Transfer of Care (Signed)
Immediate Anesthesia Transfer of Care Note  Patient: Crystal Duarte  Procedure(s) Performed: ARTHROSCOPY LEFT WRIST WITH DEBRIDEMENT (Left Wrist)  Patient Location: PACU  Anesthesia Type:General and Regional  Level of Consciousness: awake, alert  and oriented  Airway & Oxygen Therapy: Patient Spontanous Breathing and Patient connected to face mask oxygen  Post-op Assessment: Report given to RN and Post -op Vital signs reviewed and stable  Post vital signs: Reviewed and stable  Last Vitals:  Vitals Value Taken Time  BP 133/82 03/13/2018  2:47 PM  Temp    Pulse 89 03/13/2018  2:51 PM  Resp 15 03/13/2018  2:51 PM  SpO2 98 % 03/13/2018  2:51 PM  Vitals shown include unvalidated device data.  Last Pain:  Vitals:   03/13/18 1215  TempSrc: Oral  PainSc: 5       Patients Stated Pain Goal: 2 (18/34/37 3578)  Complications: No apparent anesthesia complications

## 2018-03-13 NOTE — Op Note (Signed)
NAME: Crystal Duarte MEDICAL RECORD NO: [id number] DATE OF BIRTH: Jul 22, 1960 FACILITY: [facility] LOCATION: [facility] PHYSICIAN: Tennis Must, MD   OPERATIVE REPORT   DATE OF PROCEDURE: 03/13/18    PREOPERATIVE DIAGNOSIS:   Left wrist pain with possible TFCC injury   POSTOPERATIVE DIAGNOSIS:   Left wrist central TFCC tear, lunotriquetral ligament tear, posttraumatic degeneration   PROCEDURE:   After wrist arthroscopy with debridement   SURGEON:  Leanora Cover, M.D.   ASSISTANT: Daryll Brod, MD   ANESTHESIA:  General with regional   INTRAVENOUS FLUIDS:  Per anesthesia flow sheet.   ESTIMATED BLOOD LOSS:  Minimal.   COMPLICATIONS:  None.   SPECIMENS:  none   TOURNIQUET TIME:   None   DISPOSITION:  Stable to PACU.   INDICATIONS: 57 year old female status post left distal radius fracture treated nonoperatively with continued pain in the left wrist particularly on the ulnar side.  MRI shows possible TFCC tear.  She wishes to undergo arthroscopy with debridement versus repair as necessary. Risks, benefits and alternatives of surgery were discussed including the risks of blood loss, infection, damage to nerves, vessels, tendons, ligaments, bone for surgery, need for additional surgery, complications with wound healing, continued pain, nonunion, malunion, stiffness.  She voiced understanding of these risks and elected to proceed.  OPERATIVE COURSE:  After being identified preoperatively by myself,  the patient and I agreed on the procedure and site of the procedure.  The surgical site was marked.  Surgical consent had been signed. She was given IV Ancef as preoperative antibiotic prophylaxis. She was transferred to the operating room and placed on the operating table in supine position with the Left upper extremity on an arm board.  General anesthesia was induced by the anesthesiologist. A regional block had been performed by anesthesia in preoperative holding.   Left  upper extremity was prepped and draped in normal sterile orthopedic fashion.  A surgical pause was performed between the surgeons, anesthesia, and operating room staff and all were in agreement as to the patient, procedure, and site of procedure.  Tourniquet was not inflated.    The wrist and hand were secured in the arthroscopy tower.  The joint was insufflated with sterile saline.  The 3-4 portal was formed through skin only and the joint entered with the arthroscopy equipment after spreading through the soft tissues.  An 18-gauge needle was used as a outflow.  The joint was examined.  There was degenerative change and fraying of cartilage surface and ligaments.  The volar ligaments were intact.  The scapholunate ligament was frayed but was intact.  Probe was not able be placed into the interval.  There was degenerative change at the lunate with a large chondral flap.  There was synovitis both radially and volarly.  There is synovitis at the TFCC insertion.  There is a pinhole tear in the central portion of the TFCC.  There did not appear to be a peripheral tear.  There was a tear of the lunotriquetral ligament with fraying.  The probe was able to be passed into this interval.  The 4-5 portal had been made for introduction of the probe.  The camera was used in both the 3 4 and 4 5 portals for examination.  A shaver was then used to debride the frayed chondral surface and fraying of the ligaments.  The chondral flap was debrided.  The central tear of the TFCC was lightly debrided.  It was felt that this was  a pinhole tear and there was no flap so further debridement was not necessary.  The midcarpal joint was entered and the camera introduced.  The SL interval was in appropriate position and tight.  There was motion at the LT interval.  The proximal pole of the capitate was not degenerative.  The arthroscopy equipment was removed.  The portals were closed with 4-0 nylon in a horizontal mattress fashion.  They were  dressed with sterile Xeroform 4 x 4's and wrapped with a Kerlix bandage.  Volar splint was placed and wrapped with Kerlix and Ace bandage.  A volar splint was placed and wrapped with Kerlix and Ace bandage. Fingertips were pink with brisk capillary refill at completion of the procedure.  The operative  drapes were broken down.  The patient was awoken from anesthesia safely.  She was transferred back to the stretcher and taken to PACU in stable condition.  I will see her back in the office in 1 week for postoperative followup.  I will give her a prescription for Norco 5/325 1-2 tabs PO q6 hours prn pain, dispense # 20.   Leanora Cover, MD Electronically signed, 03/13/18

## 2018-03-13 NOTE — Op Note (Signed)
I assisted Surgeon(s) and Role:    * Leanora Cover, MD - Primary    * Daryll Brod, MD - Assisting on the Procedure(s): ARTHROSCOPY LEFT WRIST WITH DEBRIDEMENT, POSSIBLE TRIANGULAR FIBROCARTILAGE COMPLEX TEAR REPAIR on 03/13/2018.  I provided assistance on this case as follows: Set up, traction, inspection and debridement of the joint and closure of the portals application of the dressing and splint  Electronically signed by: Daryll Brod, MD Date: 03/13/2018 Time: 2:43 PM

## 2018-03-13 NOTE — Anesthesia Postprocedure Evaluation (Signed)
Anesthesia Post Note  Patient: Crystal Duarte  Procedure(s) Performed: ARTHROSCOPY LEFT WRIST WITH DEBRIDEMENT (Left Wrist)     Anesthesia Post Evaluation  Last Vitals:  Vitals:   03/13/18 1515 03/13/18 1530  BP: 125/64 130/76  Pulse: 87 79  Resp: 20 16  Temp:  37.1 C  SpO2: 96% 96%    Last Pain:  Vitals:   03/13/18 1530  TempSrc:   PainSc: 0-No pain                 Tiajuana Amass

## 2018-03-13 NOTE — Anesthesia Preprocedure Evaluation (Addendum)
Anesthesia Evaluation  Patient identified by MRN, date of birth, ID band Patient awake    Reviewed: Allergy & Precautions, NPO status , Patient's Chart, lab work & pertinent test results  Airway Mallampati: III  TM Distance: >3 FB Neck ROM: Full    Dental  (+) Dental Advisory Given   Pulmonary COPD, Current Smoker,    breath sounds clear to auscultation       Cardiovascular negative cardio ROS   Rhythm:Regular Rate:Normal     Neuro/Psych Anxiety Depression Bipolar Disorder negative neurological ROS     GI/Hepatic negative GI ROS, Neg liver ROS,   Endo/Other  negative endocrine ROS  Renal/GU negative Renal ROS     Musculoskeletal   Abdominal   Peds  Hematology negative hematology ROS (+)   Anesthesia Other Findings   Reproductive/Obstetrics                           Anesthesia Physical Anesthesia Plan  ASA: II  Anesthesia Plan: General   Post-op Pain Management:  Regional for Post-op pain   Induction: Intravenous  PONV Risk Score and Plan: 2 and Ondansetron, Dexamethasone and Treatment may vary due to age or medical condition  Airway Management Planned: LMA  Additional Equipment:   Intra-op Plan:   Post-operative Plan: Extubation in OR  Informed Consent: I have reviewed the patients History and Physical, chart, labs and discussed the procedure including the risks, benefits and alternatives for the proposed anesthesia with the patient or authorized representative who has indicated his/her understanding and acceptance.     Plan Discussed with:   Anesthesia Plan Comments:        Anesthesia Quick Evaluation

## 2018-03-13 NOTE — Discharge Instructions (Addendum)
[facility] Instructions Hand Surgery  Wound Care: Keep your hand elevated above the level of your heart.  Do not allow it to dangle by your side.  Keep the dressing dry and do not remove it unless your doctor advises you to do so.  He will usually change it at the time of your post-op visit.  Moving your fingers is advised to stimulate circulation but will depend on the site of your surgery.  If you have a splint applied, your doctor will advise you regarding movement.  Activity: Do not drive or operate machinery today.  Rest today and then you may return to your normal activity and work as indicated by your physician.  Diet:  Drink liquids today or eat a light diet.  You may resume a regular diet tomorrow.      Post Anesthesia Home Care Instructions  Activity: Get plenty of rest for the remainder of the day. A responsible individual must stay with you for 24 hours following the procedure.  For the next 24 hours, DO NOT: -Drive a car -Paediatric nurse -Drink alcoholic beverages -Take any medication unless instructed by your physician -Make any legal decisions or sign important papers.  Meals: Start with liquid foods such as gelatin or soup. Progress to regular foods as tolerated. Avoid greasy, spicy, heavy foods. If nausea and/or vomiting occur, drink only clear liquids until the nausea and/or vomiting subsides. Call your physician if vomiting continues.  Special Instructions/Symptoms: Your throat may feel dry or sore from the anesthesia or the breathing tube placed in your throat during surgery. If this causes discomfort, gargle with warm salt water. The discomfort should disappear within 24 hours.  If you had a scopolamine patch placed behind your ear for the management of post- operative nausea and/or vomiting:  1. The medication in the patch is effective for 72 hours, after which it should be removed.  Wrap patch in a tissue and discard in the trash. Wash hands thoroughly with  soap and water. 2. You may remove the patch earlier than 72 hours if you experience unpleasant side effects which may include dry mouth, dizziness or visual disturbances. 3. Avoid touching the patch. Wash your hands with soap and water after contact with the patch.     General expectations: Pain for two to three days. Fingers may become slightly swollen.  Call your doctor if any of the following occur: Severe pain not relieved by pain medication. Elevated temperature. Dressing soaked with blood. Inability to move fingers. White or bluish color to fingers.   Regional Anesthesia Blocks  1. Numbness or the inability to move the "blocked" extremity may last from 3-48 hours after placement. The length of time depends on the medication injected and your individual response to the medication. If the numbness is not going away after 48 hours, call your surgeon.  2. The extremity that is blocked will need to be protected until the numbness is gone and the  Strength has returned. Because you cannot feel it, you will need to take extra care to avoid injury. Because it may be weak, you may have difficulty moving it or using it. You may not know what position it is in without looking at it while the block is in effect.  3. For blocks in the legs and feet, returning to weight bearing and walking needs to be done carefully. You will need to wait until the numbness is entirely gone and the strength has returned. You should be able to  move your leg and foot normally before you try and bear weight or walk. You will need someone to be with you when you first try to ensure you do not fall and possibly risk injury.  4. Bruising and tenderness at the needle site are common side effects and will resolve in a few days.  5. Persistent numbness or new problems with movement should be communicated to the surgeon or the [facility] [phone] [place] [phone]).

## 2018-03-13 NOTE — Anesthesia Procedure Notes (Signed)
Procedure Name: LMA Insertion Date/Time: 03/13/2018 1:35 PM Performed by: Maryella Shivers, CRNA Pre-anesthesia Checklist: Patient identified, Emergency Drugs available, Suction available and Patient being monitored Patient Re-evaluated:Patient Re-evaluated prior to induction Oxygen Delivery Method: Circle system utilized Preoxygenation: Pre-oxygenation with 100% oxygen Induction Type: IV induction Ventilation: Mask ventilation without difficulty LMA: LMA inserted LMA Size: 4.0 Number of attempts: 1 Airway Equipment and Method: Bite block Placement Confirmation: positive ETCO2 Tube secured with: Tape Dental Injury: Teeth and Oropharynx as per pre-operative assessment

## 2018-03-14 ENCOUNTER — Encounter (HOSPITAL_BASED_OUTPATIENT_CLINIC_OR_DEPARTMENT_OTHER): Payer: Self-pay | Admitting: Orthopedic Surgery

## 2018-03-15 NOTE — Addendum Note (Signed)
Addendum  created 03/15/18 1307 by Tawni Millers, CRNA   Charge Capture section accepted

## 2018-03-21 DIAGNOSIS — S52552D Other extraarticular fracture of lower end of left radius, subsequent encounter for closed fracture with routine healing: Secondary | ICD-10-CM | POA: Diagnosis not present

## 2018-03-21 DIAGNOSIS — S6982XD Other specified injuries of left wrist, hand and finger(s), subsequent encounter: Secondary | ICD-10-CM | POA: Diagnosis not present

## 2018-04-02 ENCOUNTER — Ambulatory Visit: Payer: Self-pay

## 2018-04-05 ENCOUNTER — Ambulatory Visit (INDEPENDENT_AMBULATORY_CARE_PROVIDER_SITE_OTHER): Payer: Medicare HMO | Admitting: *Deleted

## 2018-04-05 DIAGNOSIS — Z23 Encounter for immunization: Secondary | ICD-10-CM | POA: Diagnosis not present

## 2018-04-09 DIAGNOSIS — R69 Illness, unspecified: Secondary | ICD-10-CM | POA: Diagnosis not present

## 2018-04-20 DIAGNOSIS — R69 Illness, unspecified: Secondary | ICD-10-CM | POA: Diagnosis not present

## 2018-04-20 DIAGNOSIS — F411 Generalized anxiety disorder: Secondary | ICD-10-CM | POA: Diagnosis not present

## 2018-05-01 DIAGNOSIS — M19132 Post-traumatic osteoarthritis, left wrist: Secondary | ICD-10-CM | POA: Insufficient documentation

## 2018-05-28 DIAGNOSIS — M19132 Post-traumatic osteoarthritis, left wrist: Secondary | ICD-10-CM | POA: Diagnosis not present

## 2018-06-04 DIAGNOSIS — R69 Illness, unspecified: Secondary | ICD-10-CM | POA: Diagnosis not present

## 2018-06-04 DIAGNOSIS — F411 Generalized anxiety disorder: Secondary | ICD-10-CM | POA: Diagnosis not present

## 2018-06-18 ENCOUNTER — Ambulatory Visit: Payer: Self-pay

## 2018-06-18 NOTE — Telephone Encounter (Signed)
Pt back home for 5 days and was visiting her sister in [place]. Pt denies any SOB, fever or cough. Advised pt that she was in a high risk area but because she is having no symptoms, she should self quarantine for 14 days. Pt stated she has not knowingly been in contact with any person with Covid 19. Advised pt to is she has some symptoms, but not all symptoms, continue to monitor at home and seek medical attention if symptoms worsens. Pt stated she was worried because she lives with her mother who has pulmonary comorbidity. Pt also given number to [facility]. Pt advised to wash hands often with soap and water, no shaking hands, avoid touching eyes, face or mouth. Pt verbalized understanding.   Reason for Disposition . Health Information question, no triage required and triager able to answer question  Answer Assessment - Initial Assessment Questions 1. REASON FOR CALL or QUESTION: "What is your reason for calling today?" or "How can I best help you?" or "What question do you have that I can help answer?"     Pt just back from [place]. She has been home for 5 days. She stayed with her sister in [place] who works in the city in [place] that is quarantined. She has been on subways and site-seeing. Pt concerned about being around her mother who has a respiratory illness. Pt was provided information for [facility] e-visit/video visit for potential screening and advised she may want to self-quarantine.  Protocols used: INFORMATION ONLY CALL-A-AH

## 2018-06-22 ENCOUNTER — Ambulatory Visit: Payer: Self-pay

## 2018-06-22 NOTE — Telephone Encounter (Signed)
Pt called stating that she has been in quarantine since returning form [place] on 3/11.  She states that she has developed symptoms of dry cough,nasal congestion.. She gets winded easily and feels fatigued. Pt denies fever.  She was in [place] staying with her sister who works in [employer].  She is unsure of a direct contact. Per protocol Office was notified. Per Marcelo Baldy RN, pt is to continue self quarentine.  She is to monitor symptoms. And call back for worsening of breathing of development of fever. Stay at home precautions of the CDC read to patient. Pt verbalized understanding of all instructions.  Reason for Disposition . [1] Cough occurs AND [2] within 14 days of COVID-19 EXPOSURE  Answer Assessment - Initial Assessment Questions 1. CONFIRMED CASE: "Who is the person with the confirmed COVID-19 infection that you were exposed to?"     Unsure Visiting sister from [place] Sister works in [place] 2. PLACE of CONTACT: "Where were you when you were exposed to COVID-19  (coronavirus disease 2019)?" (e.g., city, state, country)     [place] 3. TYPE of CONTACT: "How much contact was there?" (e.g., live in same house, work in same office, same school)     Visited with sister 6 days 4. DATE of CONTACT: "When did you have contact with a coronavirus patient?" (e.g., days)     unsure 5. DURATION of CONTACT: "How long were you in contact with the COVID-19 (coronavirus disease) patient?" (e.g., a few seconds, passed by person, a few minutes, live with the patient)     unsure 6. SYMPTOMS: "Do you have any symptoms?" (e.g., fever, cough, breathing difficulty)     Dry cough nasal congestion, Gets winded with exertion fatigue 7. PREGNANCY OR POSTPARTUM: "Is there any chance you are pregnant?" "When was your last menstrual period?" "Did you deliver in the last 2 weeks?"     No 8. HIGH RISK: "Do you have any heart or lung problems? Do you have a weakened immune system?" (e.g., CHF, COPD, asthma, HIV  positive, chemotherapy, renal failure, diabetes mellitus, sickle cell anemia)     COPD  Protocols used: CORONAVIRUS (COVID-19) EXPOSURE-A-AH

## 2018-07-17 DIAGNOSIS — R69 Illness, unspecified: Secondary | ICD-10-CM | POA: Diagnosis not present

## 2018-07-26 DIAGNOSIS — R69 Illness, unspecified: Secondary | ICD-10-CM | POA: Diagnosis not present

## 2018-08-09 DIAGNOSIS — R69 Illness, unspecified: Secondary | ICD-10-CM | POA: Diagnosis not present

## 2018-09-03 DIAGNOSIS — R69 Illness, unspecified: Secondary | ICD-10-CM | POA: Diagnosis not present

## 2018-09-03 DIAGNOSIS — F411 Generalized anxiety disorder: Secondary | ICD-10-CM | POA: Diagnosis not present

## 2018-09-06 DIAGNOSIS — R69 Illness, unspecified: Secondary | ICD-10-CM | POA: Diagnosis not present

## 2018-09-26 ENCOUNTER — Encounter: Payer: Self-pay | Admitting: Neurology

## 2018-09-26 DIAGNOSIS — M19132 Post-traumatic osteoarthritis, left wrist: Secondary | ICD-10-CM | POA: Diagnosis not present

## 2018-09-26 DIAGNOSIS — G5622 Lesion of ulnar nerve, left upper limb: Secondary | ICD-10-CM | POA: Diagnosis not present

## 2018-09-27 DIAGNOSIS — M542 Cervicalgia: Secondary | ICD-10-CM | POA: Diagnosis not present

## 2018-09-28 ENCOUNTER — Other Ambulatory Visit: Payer: Self-pay

## 2018-09-28 DIAGNOSIS — G5622 Lesion of ulnar nerve, left upper limb: Secondary | ICD-10-CM

## 2018-10-04 DIAGNOSIS — R69 Illness, unspecified: Secondary | ICD-10-CM | POA: Diagnosis not present

## 2018-10-08 ENCOUNTER — Ambulatory Visit (INDEPENDENT_AMBULATORY_CARE_PROVIDER_SITE_OTHER): Payer: Medicare HMO | Admitting: Internal Medicine

## 2018-10-08 ENCOUNTER — Encounter: Payer: Self-pay | Admitting: Internal Medicine

## 2018-10-08 ENCOUNTER — Ambulatory Visit
Admission: RE | Admit: 2018-10-08 | Discharge: 2018-10-08 | Disposition: A | Discharge status: Home / Self Care | Payer: Medicare HMO | Source: Ambulatory Visit | Attending: Internal Medicine | Admitting: Internal Medicine

## 2018-10-08 ENCOUNTER — Other Ambulatory Visit: Payer: Self-pay

## 2018-10-08 ENCOUNTER — Other Ambulatory Visit (INDEPENDENT_AMBULATORY_CARE_PROVIDER_SITE_OTHER): Payer: Medicare HMO

## 2018-10-08 VITALS — BP 112/80 | HR 77 | Temp 98.6°F | Ht 65.0 in | Wt 186.0 lb

## 2018-10-08 DIAGNOSIS — R109 Unspecified abdominal pain: Secondary | ICD-10-CM | POA: Diagnosis not present

## 2018-10-08 LAB — CBC
HCT: 40.5 % (ref 36.0–46.0)
Hemoglobin: 13.5 g/dL (ref 12.0–15.0)
MCHC: 33.5 g/dL (ref 30.0–36.0)
MCV: 94.9 fl (ref 78.0–100.0)
Platelets: 304 10*3/uL (ref 150.0–400.0)
RBC: 4.26 Mil/uL (ref 3.87–5.11)
RDW: 13.9 % (ref 11.5–15.5)
WBC: 10.3 10*3/uL (ref 4.0–10.5)

## 2018-10-08 LAB — POCT URINALYSIS DIPSTICK
Bilirubin, UA: NEGATIVE
Blood, UA: POSITIVE
Glucose, UA: NEGATIVE
Ketones, UA: NEGATIVE
Leukocytes, UA: NEGATIVE
Nitrite, UA: NEGATIVE
Protein, UA: NEGATIVE
Spec Grav, UA: 1.015 (ref 1.010–1.025)
Urobilinogen, UA: 0.2 E.U./dL
pH, UA: 6 (ref 5.0–8.0)

## 2018-10-08 LAB — COMPREHENSIVE METABOLIC PANEL
ALT: 14 U/L (ref 0–35)
AST: 14 U/L (ref 0–37)
Albumin: 4.5 g/dL (ref 3.5–5.2)
Alkaline Phosphatase: 100 U/L (ref 39–117)
BUN: 11 mg/dL (ref 6–23)
CO2: 30 mEq/L (ref 19–32)
Calcium: 9.5 mg/dL (ref 8.4–10.5)
Chloride: 103 mEq/L (ref 96–112)
Creatinine, Ser: 0.83 mg/dL (ref 0.40–1.20)
GFR: 70.3 mL/min (ref >60.00)
Glucose, Bld: 92 mg/dL (ref 70–99)
Potassium: 4.2 mEq/L (ref 3.5–5.1)
Sodium: 139 mEq/L (ref 135–145)
Total Bilirubin: 0.4 mg/dL (ref 0.2–1.2)
Total Protein: 7.3 g/dL (ref 6.0–8.3)

## 2018-10-08 MED ORDER — CEPHALEXIN 500 MG PO CAPS: 500.0000 mg | ORAL_CAPSULE | Freq: Two times a day (BID) | ORAL | 0 refills | Status: DC

## 2018-10-08 MED ORDER — HYDROCODONE-ACETAMINOPHEN 5-325 MG PO TABS: 1.0000 | ORAL_TABLET | Freq: Three times a day (TID) | ORAL | 0 refills | Status: DC | PRN

## 2018-10-08 NOTE — Patient Instructions (Signed)
We have sent in hydrocodone for the pain which you can take up to 3 times per day.   We have sent in keflex to take 1 pill twice a day for 5 days. We can adjust this if needed based on the culture.   We will get the CT scan to look for kidney stones.

## 2018-10-08 NOTE — Progress Notes (Signed)
   Subjective:   Patient ID: Crystal Duarte, female    DOB: 1960/06/22, 58 y.o.   MRN: [id number]  HPI The patient is a 58 YO female coming in for concerns about right back pain radiating down to her leg. She is also having some problems going to the bathroom. Has never had kidney stones before. Used to have kidney infections and this feels similar. Denies fevers or chills. Denies nausea or vomiting. Denies pain while urinating. She has had many infections as a late teens with fevers to 105F. She denies cough or SOB. Denies sinus congestion. Prior CT abdomen and MRI in 2012 due to flank pain with a hemorrhagic cyst right kidney 58mm and she does not recall this at all. Pain is 10/10 and she has not tried anything for it. Started 3 days ago. Overall worsening. Son with kidney stones.   PMH, [facility], social history reviewed and updated  Review of Systems  Constitutional: Negative.   HENT: Negative.   Eyes: Negative.   Respiratory: Negative for cough, chest tightness and shortness of breath.   Cardiovascular: Negative for chest pain, palpitations and leg swelling.  Gastrointestinal: Positive for abdominal pain. Negative for abdominal distention, anal bleeding, blood in stool, constipation, diarrhea, nausea, rectal pain and vomiting.  Genitourinary: Positive for difficulty urinating, flank pain and frequency. Negative for decreased urine volume, dysuria, enuresis, hematuria, pelvic pain, urgency, vaginal bleeding, vaginal discharge and vaginal pain.  Musculoskeletal: Positive for back pain.  Skin: Negative.   Neurological: Negative.   Psychiatric/Behavioral: Negative.     Objective:  Physical Exam Constitutional:      Appearance: She is well-developed.  HENT:     Head: Normocephalic and atraumatic.  Neck:     Musculoskeletal: Normal range of motion.  Cardiovascular:     Rate and Rhythm: Normal rate and regular rhythm.  Pulmonary:     Effort: Pulmonary effort is normal. No respiratory  distress.     Breath sounds: Normal breath sounds. No wheezing or rales.  Abdominal:     General: Bowel sounds are normal. There is no distension.     Palpations: Abdomen is soft.     Tenderness: There is no abdominal tenderness. There is right CVA tenderness. There is no rebound.  Skin:    General: Skin is warm and dry.  Neurological:     Mental Status: She is alert and oriented to person, place, and time.     Coordination: Coordination normal.     Vitals:   10/08/18 1407  BP: 112/80  Pulse: 77  Temp: 98.6 F (37 C)  TempSrc: Oral  SpO2: 97%  Weight: 186 lb (84.4 kg)  Height: 5\' 5"  (1.651 m)    Assessment & Plan:  Visit time 25 minutes: greater than 50% of that time was spent in face to face counseling and coordination of care with the patient: counseled about possible etiologies and various treatment as well as limitations of outpatient therapy for pain management

## 2018-10-08 NOTE — Assessment & Plan Note (Signed)
We talked about possibility of kidney stones, early infection, recurrent hemorrhagic cyst right kidney. Start keflex to cover for infection. U/A done in the office with hematuria. Ordered CBC, CMP and stat CT abdomen to rule out kidney stone. Rx vicodin for pain and advised push fluids.

## 2018-10-09 LAB — URINE CULTURE
MICRO NUMBER:: 637025
SPECIMEN QUALITY:: ADEQUATE

## 2018-10-10 ENCOUNTER — Telehealth: Payer: Self-pay

## 2018-10-10 DIAGNOSIS — R3911 Hesitancy of micturition: Secondary | ICD-10-CM

## 2018-10-10 NOTE — Telephone Encounter (Signed)
Copied from [place] [id number]. Topic: General - Inquiry >> Oct 10, 2018 11:09 AM Virl Axe D wrote: Reason for CRM: Pt called to request Dr. Sharlet Salina call her to go over her recent test results. Agent offered to have appt scheduled and pt declined. Please advise. CB#[phone]

## 2018-10-10 NOTE — Telephone Encounter (Signed)
States still has the pain in her back states seems to be helping, but still has the throbbing in the back. Since there is no infection what could be causing it? Also why was there blood in the urine? And can her bladder being decompressed be causing any of her symptoms? Stating having trouble emptying bladder still, has to strain to get a stream but then has to wait for the rest to trickle out. What is the next step?

## 2018-10-11 NOTE — Addendum Note (Signed)
Addended by: Pricilla Holm A on: 10/11/2018 10:08 AM   Modules accepted: Orders

## 2018-10-11 NOTE — Telephone Encounter (Signed)
The pain in her back is likely muscular and not likely related to her bladder at all. We should check a urine in a few months and if still blood in there we will get her in with a urologist.

## 2018-10-11 NOTE — Telephone Encounter (Signed)
Referral placed.

## 2018-10-11 NOTE — Telephone Encounter (Signed)
Patient is willing to get the referral to the urologist

## 2018-10-12 NOTE — Telephone Encounter (Signed)
i'm sending the referral now and will mark it urgent. Pt is aware

## 2018-10-12 NOTE — Telephone Encounter (Signed)
Noted  

## 2018-10-12 NOTE — Telephone Encounter (Signed)
I am not sure if there is a long wait or referral to urology do you know

## 2018-10-12 NOTE — Telephone Encounter (Signed)
Pt following up on referral fo urology. Pt would like to know if you can expedite, as the pain is severe in her right back kidney and abx is not helping.

## 2018-10-16 DIAGNOSIS — M542 Cervicalgia: Secondary | ICD-10-CM | POA: Diagnosis not present

## 2018-10-22 DIAGNOSIS — N3281 Overactive bladder: Secondary | ICD-10-CM | POA: Diagnosis not present

## 2018-10-22 DIAGNOSIS — R1084 Generalized abdominal pain: Secondary | ICD-10-CM | POA: Diagnosis not present

## 2018-10-23 DIAGNOSIS — M542 Cervicalgia: Secondary | ICD-10-CM | POA: Diagnosis not present

## 2018-11-01 ENCOUNTER — Ambulatory Visit (INDEPENDENT_AMBULATORY_CARE_PROVIDER_SITE_OTHER): Payer: Medicare HMO | Admitting: Neurology

## 2018-11-01 ENCOUNTER — Other Ambulatory Visit: Payer: Self-pay

## 2018-11-01 DIAGNOSIS — G5622 Lesion of ulnar nerve, left upper limb: Secondary | ICD-10-CM | POA: Diagnosis not present

## 2018-11-01 NOTE — Procedures (Signed)
[facility]  [address], [place]  [place], [place] [address] Tel: [phone] Fax:  [phone] Test Date:  11/01/2018  Patient: Crystal Duarte DOB: [birth year] Physician: Narda Amber, DO  Sex: Female Height: 5\' 5"  Ref Phys: Leanora Cover, MD  ID#: [id number] Temp: 32.0C Technician:    Patient Complaints: This is a 58 year old female referred for evaluation of left hand pain and numbness/tingling.  NCV & EMG Findings: Extensive electrodiagnostic testing of the left upper extremity and additional studies of the right shows: 1. Bilateral median and mixed palmar sensory responses are within normal limits.  Left ulnar sensory response is asymmetrically reduced as compared to the right. 2. Left ulnar motor response shows slowed conduction velocity across the elbow (A Elbow-B Elbow, 48 m/s), with normal amplitude and latency.  Bilateral median and right ulnar motor responses are within normal limits. 3. There is no evidence of active or chronic motor axonal loss changes affecting any of the tested muscles.  Motor unit configuration and recruitment pattern is within normal limits.  Impression: 1. Left ulnar neuropathy across the elbow, predominantly demyelinating, mild. 2. There is no cervical radiculopathy or carpal tunnel syndrome affecting the upper extremities.   ___________________________ Narda Amber, DO    Nerve Conduction Studies Anti Sensory Summary Tabl purelye   Site NR Peak ( mild.ms) Norm Peak (ms) P-T Amp (V) Norm P-T Amp  Left DorsCutan Anti Sensory (Dorsum 5th MC)  32C  Wrist    1.5 <3.1 14.1 >10  Left Median Anti Sensory (2nd Digit)  32C  Wrist    3.0 <3.6 61.3 >15  Right Median Anti Sensory (2nd Digit)  32C  Wrist    2.9 <3.6 68.0 >15  Left Ulnar Anti Sensory (5th Digit)  32C  Wrist    2.8 <3.1 36.4 >10  Right Ulnar Anti Sensory (5th Digit)  32C  Wrist    2.5 <3.1 57.9 >10   Motor Summary Table   Site NR Onset (ms) Norm Onset (ms) O-P  Amp (mV) Norm O-P Amp Site1 Site2 Delta-0 (ms) Dist (cm) Vel (m/s) Norm Vel (m/s)  Left Median Motor (Abd Poll Brev)  32C  Wrist    2.9 <4.0 9.2 >6 Elbow Wrist 4.3 27.0 63 >50  Elbow    7.2  8.1         Right Median Motor (Abd Poll Brev)  32C  Wrist    2.7 <4.0 12.3 >6 Elbow Wrist 4.6 27.0 59 >50  Elbow    7.3  11.5         Left Ulnar Motor (Abd Dig Minimi)  32C  Wrist    2.2 <3.1 9.5 >7 B Elbow Wrist 3.7 24.0 65 >50  B Elbow    5.9  9.1  A Elbow B Elbow 2.1 10.0 48 >50  A Elbow    8.0  8.8         Right Ulnar Motor (Abd Dig Minimi)  32C  Wrist    2.2 <3.1 7.9 >7 B Elbow Wrist 3.0 20.0 67 >50  B Elbow    5.2  7.2  A Elbow B Elbow 1.8 10.0 56 >50  A Elbow    7.0  6.2          Comparison Summary Table   Site NR Peak (ms) Norm Peak (ms) P-T Amp (V) Site1 Site2 Delta-P (ms) Norm Delta (ms)  Left Median/Ulnar Palm Comparison (Wrist - 8cm)  32C  Median Palm    1.6 <2.2 53.3  Median Palm Ulnar Palm 0.1   Ulnar Palm    1.5 <2.2 11.0      Right Median/Ulnar Palm Comparison (Wrist - 8cm)  32C  Median Palm    1.6 <2.2 74.4 Median Palm Ulnar Palm 0.0   Ulnar Palm    1.6 <2.2 19.0       EMG   Side Muscle Ins Act Fibs Psw Fasc Number Recrt Dur Dur. Amp Amp. Poly Poly. Comment  Left 1stDorInt Nml Nml Nml Nml Nml Nml Nml Nml Nml Nml Nml Nml N/A  Left Ext Indicis Nml Nml Nml Nml Nml Nml Nml Nml Nml Nml Nml Nml N/A  Left PronatorTeres Nml Nml Nml Nml Nml Nml Nml Nml Nml Nml Nml Nml N/A  Left Biceps Nml Nml Nml Nml Nml Nml Nml Nml Nml Nml Nml Nml N/A  Left Triceps Nml Nml Nml Nml Nml Nml Nml Nml Nml Nml Nml Nml N/A  Left Deltoid Nml Nml Nml Nml Nml Nml Nml Nml Nml Nml Nml Nml N/A  Left ABD Dig Min Nml Nml Nml Nml Nml Nml Nml Nml Nml Nml Nml Nml N/A  Left FlexCarpiUln Nml Nml Nml Nml Nml Nml Nml Nml Nml Nml Nml Nml N/A  Right 1stDorInt Nml Nml Nml Nml Nml Nml Nml Nml Nml Nml Nml Nml N/A  Right PronatorTeres Nml Nml Nml Nml Nml Nml Nml Nml Nml Nml Nml Nml N/A  Right Deltoid Nml Nml Nml Nml  Nml Nml Nml Nml Nml Nml Nml Nml N/A  Right Biceps Nml Nml Nml Nml Nml Nml Nml Nml Nml Nml Nml Nml N/A  Right Triceps Nml Nml Nml Nml Nml Nml Nml Nml Nml Nml Nml Nml N/A      Waveforms:

## 2018-11-06 ENCOUNTER — Other Ambulatory Visit: Payer: Self-pay | Admitting: Orthopedic Surgery

## 2018-11-06 DIAGNOSIS — G5622 Lesion of ulnar nerve, left upper limb: Secondary | ICD-10-CM | POA: Diagnosis not present

## 2018-11-15 DIAGNOSIS — G5621 Lesion of ulnar nerve, right upper limb: Secondary | ICD-10-CM | POA: Diagnosis not present

## 2018-11-15 DIAGNOSIS — M5412 Radiculopathy, cervical region: Secondary | ICD-10-CM | POA: Insufficient documentation

## 2018-11-15 DIAGNOSIS — M7918 Myalgia, other site: Secondary | ICD-10-CM | POA: Insufficient documentation

## 2018-11-20 DIAGNOSIS — F411 Generalized anxiety disorder: Secondary | ICD-10-CM | POA: Diagnosis not present

## 2018-11-20 DIAGNOSIS — R69 Illness, unspecified: Secondary | ICD-10-CM | POA: Diagnosis not present

## 2018-11-22 DIAGNOSIS — H2513 Age-related nuclear cataract, bilateral: Secondary | ICD-10-CM | POA: Diagnosis not present

## 2018-11-22 DIAGNOSIS — H52203 Unspecified astigmatism, bilateral: Secondary | ICD-10-CM | POA: Diagnosis not present

## 2018-11-22 DIAGNOSIS — H524 Presbyopia: Secondary | ICD-10-CM | POA: Diagnosis not present

## 2018-11-22 DIAGNOSIS — H25013 Cortical age-related cataract, bilateral: Secondary | ICD-10-CM | POA: Diagnosis not present

## 2018-11-22 DIAGNOSIS — H5203 Hypermetropia, bilateral: Secondary | ICD-10-CM | POA: Diagnosis not present

## 2018-11-23 ENCOUNTER — Other Ambulatory Visit: Payer: Self-pay

## 2018-11-23 ENCOUNTER — Encounter (HOSPITAL_BASED_OUTPATIENT_CLINIC_OR_DEPARTMENT_OTHER): Payer: Self-pay | Admitting: *Deleted

## 2018-11-28 NOTE — Progress Notes (Signed)
Drink at 0630 the morning of your surgery.        Enhanced Recovery after Surgery for Orthopedics Enhanced Recovery after Surgery is a protocol used to improve the stress on your body and your recovery after surgery.  Patient Instructions  . The night before surgery:  o No food after midnight. ONLY clear liquids after midnight  . The day of surgery (if you do NOT have diabetes):  o Drink ONE (1) Pre-Surgery Clear Ensure as directed.   o This drink was given to you during your hospital  pre-op appointment visit. o The pre-op nurse will instruct you on the time to drink the  Pre-Surgery Ensure depending on your surgery time. o Finish the drink at the designated time by the pre-op nurse.  o Nothing else to drink after completing the  Pre-Surgery Clear Ensure.  . The day of surgery (if you have diabetes): o Drink ONE (1) Gatorade 2 (G2) as directed. o This drink was given to you during your hospital  pre-op appointment visit.  o The pre-op nurse will instruct you on the time to drink the   Gatorade 2 (G2) depending on your surgery time. o Color of the Gatorade may vary. Red is not allowed. o Nothing else to drink after completing the  Gatorade 2 (G2).         If you have questions, please contact your surgeon's office.        2% CHG Cloths CHG  Bathing Process . If using a cloth warmer, warm cloths before use. Marland Kitchen Cloths may be used without being warmed. . Use a clean CHG cloth for each area of the body to reduce the chance of spreading germs from one area to another. . Do not use above jawline (should not come in contact to eyes or ears). Wendee Copp face and head first with warm cloth before starting with CHG. o Use shampoo cap or directly use shampoo sparingly, avoid contact with rest of the body, as it may deactivate CHG. . Use all six cloths in the following order:  1. Cloth 1: Neck, shoulders, and chest. 2. Cloth 2: Both arms, both hands, web spaces, and axilla. 3.  Cloth 3: Abdomen and then groin/perineum. 4. Cloth 4: Right leg, right foot, and web spaces. 5. Cloth 5: Left leg, left foot, and web spaces. 6. Cloth 6: Back of neck, back, and then buttocks.  . After application to each body site, be sure to clean tubing from [place], drains, G-tube/J-tubes, rectal tubes, chest tubes within 6 inches of the patient.    CHG Bathing Process - Key Points . Firmly massage skin with CHG cloth.  o Skin may feel sticky for a few minutes. o Let air dry . CHG can replace bathing:  o Do NOT bathe with soap and water after using the CHG wipes.  If you chose to do a soap and water bath, it must be done first, allowed to dry, then use CHG wipes.  . If additional moisturizer is needed, use only CHG-compatible products. . Certain lotions will inactivate CHG, ensure to check with manufacturer for compatibility. . Dispose of leftover cloths - Do NOT flush wipes.

## 2018-12-03 ENCOUNTER — Other Ambulatory Visit (HOSPITAL_COMMUNITY)
Admission: RE | Admit: 2018-12-03 | Discharge: 2018-12-03 | Disposition: A | Discharge status: Home / Self Care | Payer: Medicare HMO | Source: Ambulatory Visit | Attending: Orthopedic Surgery | Admitting: Orthopedic Surgery

## 2018-12-04 ENCOUNTER — Other Ambulatory Visit (HOSPITAL_COMMUNITY)
Admission: RE | Admit: 2018-12-04 | Discharge: 2018-12-04 | Disposition: A | Discharge status: Home / Self Care | Payer: Medicare HMO | Source: Ambulatory Visit | Attending: Orthopedic Surgery | Admitting: Orthopedic Surgery

## 2018-12-04 DIAGNOSIS — Z20828 Contact with and (suspected) exposure to other viral communicable diseases: Secondary | ICD-10-CM | POA: Insufficient documentation

## 2018-12-04 DIAGNOSIS — G5622 Lesion of ulnar nerve, left upper limb: Secondary | ICD-10-CM | POA: Insufficient documentation

## 2018-12-04 DIAGNOSIS — Z01812 Encounter for preprocedural laboratory examination: Secondary | ICD-10-CM | POA: Insufficient documentation

## 2018-12-04 LAB — SARS CORONAVIRUS 2 (TAT 6-24 HRS): SARS Coronavirus 2: NEGATIVE

## 2018-12-06 ENCOUNTER — Encounter (HOSPITAL_BASED_OUTPATIENT_CLINIC_OR_DEPARTMENT_OTHER): Payer: Self-pay | Admitting: Anesthesiology

## 2018-12-06 ENCOUNTER — Ambulatory Visit (HOSPITAL_BASED_OUTPATIENT_CLINIC_OR_DEPARTMENT_OTHER): Payer: Medicare HMO | Admitting: Certified Registered"

## 2018-12-06 ENCOUNTER — Encounter (HOSPITAL_BASED_OUTPATIENT_CLINIC_OR_DEPARTMENT_OTHER)
Admission: RE | Disposition: A | Discharge status: Home / Self Care | Payer: Self-pay | Source: Home / Self Care | Attending: Orthopedic Surgery

## 2018-12-06 ENCOUNTER — Ambulatory Visit (HOSPITAL_BASED_OUTPATIENT_CLINIC_OR_DEPARTMENT_OTHER)
Admission: RE | Admit: 2018-12-06 | Discharge: 2018-12-06 | Disposition: A | Discharge status: Home / Self Care | Payer: Medicare HMO | Attending: Orthopedic Surgery | Admitting: Orthopedic Surgery

## 2018-12-06 DIAGNOSIS — M8580 Other specified disorders of bone density and structure, unspecified site: Secondary | ICD-10-CM | POA: Diagnosis not present

## 2018-12-06 DIAGNOSIS — Z8249 Family history of ischemic heart disease and other diseases of the circulatory system: Secondary | ICD-10-CM | POA: Insufficient documentation

## 2018-12-06 DIAGNOSIS — F329 Major depressive disorder, single episode, unspecified: Secondary | ICD-10-CM | POA: Insufficient documentation

## 2018-12-06 DIAGNOSIS — G8918 Other acute postprocedural pain: Secondary | ICD-10-CM | POA: Diagnosis not present

## 2018-12-06 DIAGNOSIS — F17210 Nicotine dependence, cigarettes, uncomplicated: Secondary | ICD-10-CM | POA: Insufficient documentation

## 2018-12-06 DIAGNOSIS — Z882 Allergy status to sulfonamides status: Secondary | ICD-10-CM | POA: Insufficient documentation

## 2018-12-06 DIAGNOSIS — Z90710 Acquired absence of both cervix and uterus: Secondary | ICD-10-CM | POA: Insufficient documentation

## 2018-12-06 DIAGNOSIS — G5622 Lesion of ulnar nerve, left upper limb: Secondary | ICD-10-CM | POA: Diagnosis not present

## 2018-12-06 DIAGNOSIS — Z888 Allergy status to other drugs, medicaments and biological substances status: Secondary | ICD-10-CM | POA: Diagnosis not present

## 2018-12-06 DIAGNOSIS — Z801 Family history of malignant neoplasm of trachea, bronchus and lung: Secondary | ICD-10-CM | POA: Insufficient documentation

## 2018-12-06 DIAGNOSIS — Z86010 Personal history of colonic polyps: Secondary | ICD-10-CM | POA: Diagnosis not present

## 2018-12-06 DIAGNOSIS — F419 Anxiety disorder, unspecified: Secondary | ICD-10-CM | POA: Insufficient documentation

## 2018-12-06 DIAGNOSIS — I739 Peripheral vascular disease, unspecified: Secondary | ICD-10-CM | POA: Diagnosis not present

## 2018-12-06 DIAGNOSIS — J441 Chronic obstructive pulmonary disease with (acute) exacerbation: Secondary | ICD-10-CM | POA: Diagnosis not present

## 2018-12-06 DIAGNOSIS — Z825 Family history of asthma and other chronic lower respiratory diseases: Secondary | ICD-10-CM | POA: Diagnosis not present

## 2018-12-06 DIAGNOSIS — R69 Illness, unspecified: Secondary | ICD-10-CM | POA: Diagnosis not present

## 2018-12-06 DIAGNOSIS — Z8041 Family history of malignant neoplasm of ovary: Secondary | ICD-10-CM | POA: Insufficient documentation

## 2018-12-06 HISTORY — PX: ULNAR NERVE TRANSPOSITION: SHX2595

## 2018-12-06 SURGERY — ULNAR NERVE DECOMPRESSION/TRANSPOSITION
Anesthesia: General | Site: Elbow | Laterality: Left

## 2018-12-06 MED ORDER — MIDAZOLAM HCL 2 MG/2ML IJ SOLN
INTRAMUSCULAR | Status: AC
  Filled 2018-12-06: qty 2

## 2018-12-06 MED ORDER — FENTANYL CITRATE (PF) 100 MCG/2ML IJ SOLN
INTRAMUSCULAR | Status: AC
  Filled 2018-12-06: qty 2

## 2018-12-06 MED ORDER — FENTANYL CITRATE (PF) 100 MCG/2ML IJ SOLN
INTRAMUSCULAR | Status: DC | PRN
  Administered 2018-12-06: 25 ug via INTRAVENOUS
  Administered 2018-12-06: 50 ug via INTRAVENOUS

## 2018-12-06 MED ORDER — CEFAZOLIN SODIUM-DEXTROSE 2-4 GM/100ML-% IV SOLN
INTRAVENOUS | Status: AC
  Filled 2018-12-06: qty 100

## 2018-12-06 MED ORDER — FENTANYL CITRATE (PF) 100 MCG/2ML IJ SOLN
25.0000 ug | INTRAMUSCULAR | Status: DC | PRN
  Administered 2018-12-06 (×2): 25 ug via INTRAVENOUS

## 2018-12-06 MED ORDER — ROPIVACAINE HCL 7.5 MG/ML IJ SOLN
INTRAMUSCULAR | Status: DC | PRN
  Administered 2018-12-06: 20 mL via PERINEURAL

## 2018-12-06 MED ORDER — OXYCODONE HCL 5 MG PO TABS
5.0000 mg | ORAL_TABLET | Freq: Once | ORAL | Status: AC
  Administered 2018-12-06: 5 mg via ORAL

## 2018-12-06 MED ORDER — LACTATED RINGERS IV SOLN: INTRAVENOUS | Status: DC

## 2018-12-06 MED ORDER — ONDANSETRON HCL 4 MG/2ML IJ SOLN
INTRAMUSCULAR | Status: DC | PRN
  Administered 2018-12-06: 4 mg via INTRAVENOUS

## 2018-12-06 MED ORDER — PHENYLEPHRINE HCL (PRESSORS) 10 MG/ML IV SOLN
INTRAVENOUS | Status: DC | PRN
  Administered 2018-12-06 (×5): 80 ug via INTRAVENOUS

## 2018-12-06 MED ORDER — MEPERIDINE HCL 25 MG/ML IJ SOLN: 6.2500 mg | INTRAMUSCULAR | Status: DC | PRN

## 2018-12-06 MED ORDER — HYDROCODONE-ACETAMINOPHEN 5-325 MG PO TABS: ORAL_TABLET | ORAL | 0 refills | Status: DC

## 2018-12-06 MED ORDER — LACTATED RINGERS IV SOLN
INTRAVENOUS | Status: DC
  Administered 2018-12-06 (×2): via INTRAVENOUS

## 2018-12-06 MED ORDER — CHLORHEXIDINE GLUCONATE 4 % EX LIQD: 60.0000 mL | Freq: Once | CUTANEOUS | Status: DC

## 2018-12-06 MED ORDER — OXYCODONE HCL 5 MG PO TABS
ORAL_TABLET | ORAL | Status: AC
  Filled 2018-12-06: qty 1

## 2018-12-06 MED ORDER — PROPOFOL 10 MG/ML IV BOLUS
INTRAVENOUS | Status: DC | PRN
  Administered 2018-12-06: 150 mg via INTRAVENOUS

## 2018-12-06 MED ORDER — DEXAMETHASONE SODIUM PHOSPHATE 4 MG/ML IJ SOLN
INTRAMUSCULAR | Status: DC | PRN
  Administered 2018-12-06: 10 mg via INTRAVENOUS

## 2018-12-06 MED ORDER — METOCLOPRAMIDE HCL 5 MG/ML IJ SOLN: 10.0000 mg | Freq: Once | INTRAMUSCULAR | Status: DC | PRN

## 2018-12-06 MED ORDER — CEFAZOLIN SODIUM-DEXTROSE 2-4 GM/100ML-% IV SOLN
2.0000 g | INTRAVENOUS | Status: AC
  Administered 2018-12-06: 2 g via INTRAVENOUS

## 2018-12-06 MED ORDER — MIDAZOLAM HCL 2 MG/2ML IJ SOLN
INTRAMUSCULAR | Status: AC
  Filled 2018-12-06: qty 6

## 2018-12-06 MED ORDER — MIDAZOLAM HCL 2 MG/2ML IJ SOLN
1.0000 mg | INTRAMUSCULAR | Status: DC | PRN
  Administered 2018-12-06: 2 mg via INTRAVENOUS

## 2018-12-06 MED ORDER — LIDOCAINE HCL (CARDIAC) PF 100 MG/5ML IV SOSY
PREFILLED_SYRINGE | INTRAVENOUS | Status: DC | PRN
  Administered 2018-12-06: 10 mg via INTRAVENOUS

## 2018-12-06 MED ORDER — PROPOFOL 10 MG/ML IV BOLUS
INTRAVENOUS | Status: AC
  Filled 2018-12-06: qty 20

## 2018-12-06 MED ORDER — FENTANYL CITRATE (PF) 100 MCG/2ML IJ SOLN
50.0000 ug | INTRAMUSCULAR | Status: DC | PRN
  Administered 2018-12-06: 100 ug via INTRAVENOUS

## 2018-12-06 SURGICAL SUPPLY — 58 items
BLADE MINI RND TIP GREEN BEAV (BLADE) ×2 IMPLANT
BLADE SURG 15 STRL LF DISP TIS (BLADE) ×2 IMPLANT
BLADE SURG 15 STRL SS (BLADE) ×2
BNDG ELASTIC 3X5.8 VLCR STR LF (GAUZE/BANDAGES/DRESSINGS) ×4 IMPLANT
BNDG ELASTIC 4X5.8 VLCR STR LF (GAUZE/BANDAGES/DRESSINGS) ×2 IMPLANT
BNDG ESMARK 4X9 LF (GAUZE/BANDAGES/DRESSINGS) ×2 IMPLANT
BNDG GAUZE ELAST 4 BULKY (GAUZE/BANDAGES/DRESSINGS) ×2 IMPLANT
CHLORAPREP W/TINT 26 (MISCELLANEOUS) ×2 IMPLANT
CORD BIPOLAR FORCEPS 12FT (ELECTRODE) ×2 IMPLANT
COVER BACK TABLE REUSABLE LG (DRAPES) ×2 IMPLANT
COVER MAYO STAND REUSABLE (DRAPES) ×2 IMPLANT
COVER WAND RF STERILE (DRAPES) IMPLANT
CUFF TOURN SGL QUICK 18X3 (MISCELLANEOUS) ×2 IMPLANT
CUFF TOURN SGL QUICK 18X4 (TOURNIQUET CUFF) ×2 IMPLANT
DECANTER SPIKE VIAL GLASS SM (MISCELLANEOUS) IMPLANT
DRAPE EXTREMITY T 121X128X90 (DISPOSABLE) ×2 IMPLANT
DRAPE SURG 17X23 STRL (DRAPES) ×2 IMPLANT
DRSG PAD ABDOMINAL 8X10 ST (GAUZE/BANDAGES/DRESSINGS) ×2 IMPLANT
GAUZE 4X4 16PLY RFD (DISPOSABLE) IMPLANT
GAUZE SPONGE 4X4 12PLY STRL (GAUZE/BANDAGES/DRESSINGS) ×2 IMPLANT
GAUZE XEROFORM 1X8 LF (GAUZE/BANDAGES/DRESSINGS) ×2 IMPLANT
GLOVE BIO SURGEON STRL SZ 6.5 (GLOVE) ×2 IMPLANT
GLOVE BIO SURGEON STRL SZ7.5 (GLOVE) ×2 IMPLANT
GLOVE BIOGEL M 7.0 STRL (GLOVE) ×2 IMPLANT
GLOVE BIOGEL PI IND STRL 7.0 (GLOVE) ×1 IMPLANT
GLOVE BIOGEL PI IND STRL 8 (GLOVE) ×2 IMPLANT
GLOVE BIOGEL PI IND STRL 8.5 (GLOVE) ×1 IMPLANT
GLOVE BIOGEL PI INDICATOR 7.0 (GLOVE) ×1
GLOVE BIOGEL PI INDICATOR 8 (GLOVE) ×2
GLOVE BIOGEL PI INDICATOR 8.5 (GLOVE) ×1
GLOVE SURG ORTHO 8.0 STRL STRW (GLOVE) ×2 IMPLANT
GOWN STRL REUS W/ TWL LRG LVL3 (GOWN DISPOSABLE) ×1 IMPLANT
GOWN STRL REUS W/TWL LRG LVL3 (GOWN DISPOSABLE) ×1
GOWN STRL REUS W/TWL XL LVL3 (GOWN DISPOSABLE) ×4 IMPLANT
NEEDLE HYPO 25X1 1.5 SAFETY (NEEDLE) IMPLANT
NS IRRIG 1000ML POUR BTL (IV SOLUTION) ×2 IMPLANT
PACK BASIN DAY SURGERY FS (CUSTOM PROCEDURE TRAY) ×2 IMPLANT
PAD CAST 3X4 CTTN HI CHSV (CAST SUPPLIES) ×1 IMPLANT
PAD CAST 4YDX4 CTTN HI CHSV (CAST SUPPLIES) ×1 IMPLANT
PADDING CAST ABS 4INX4YD NS (CAST SUPPLIES) ×1
PADDING CAST ABS COTTON 4X4 ST (CAST SUPPLIES) ×1 IMPLANT
PADDING CAST COTTON 3X4 STRL (CAST SUPPLIES) ×1
PADDING CAST COTTON 4X4 STRL (CAST SUPPLIES) ×1
SLEEVE SCD COMPRESS KNEE MED (MISCELLANEOUS) ×2 IMPLANT
SLING ARM FOAM STRAP MED (SOFTGOODS) ×2 IMPLANT
SPLINT FAST PLASTER 5X30 (CAST SUPPLIES)
SPLINT PLASTER CAST FAST 5X30 (CAST SUPPLIES) IMPLANT
SPLINT PLASTER CAST XFAST 3X15 (CAST SUPPLIES) IMPLANT
SPLINT PLASTER XTRA FASTSET 3X (CAST SUPPLIES)
STOCKINETTE 4X48 STRL (DRAPES) ×2 IMPLANT
SUT ETHILON 4 0 PS 2 18 (SUTURE) ×2 IMPLANT
SUT VIC AB 2-0 SH 27 (SUTURE) ×1
SUT VIC AB 2-0 SH 27XBRD (SUTURE) ×1 IMPLANT
SUT VICRYL 4-0 PS2 18IN ABS (SUTURE) IMPLANT
SYR BULB 3OZ (MISCELLANEOUS) ×2 IMPLANT
SYR CONTROL 10ML LL (SYRINGE) IMPLANT
TOWEL GREEN STERILE FF (TOWEL DISPOSABLE) ×2 IMPLANT
UNDERPAD 30X36 HEAVY ABSORB (UNDERPADS AND DIAPERS) ×2 IMPLANT

## 2018-12-06 NOTE — Anesthesia Preprocedure Evaluation (Addendum)
Anesthesia Evaluation  Patient identified by MRN, date of birth, ID band Patient awake    Reviewed: Allergy & Precautions, NPO status , Patient's Chart, lab work & pertinent test results  History of Anesthesia Complications (+) PONV  Airway Mallampati: II  TM Distance: >3 FB Neck ROM: Full    Dental no notable dental hx.    Pulmonary Current Smoker and Patient abstained from smoking.,  H/o Coccidioidomycosis, pulmonary    Pulmonary exam normal breath sounds clear to auscultation       Cardiovascular negative cardio ROS Normal cardiovascular exam Rhythm:Regular Rate:Normal     Neuro/Psych Anxiety Depression negative neurological ROS  negative psych ROS   GI/Hepatic negative GI ROS, Neg liver ROS,   Endo/Other  negative endocrine ROS  Renal/GU negative Renal ROS  negative genitourinary   Musculoskeletal negative musculoskeletal ROS (+)   Abdominal   Peds negative pediatric ROS (+)  Hematology negative hematology ROS (+)   Anesthesia Other Findings   Reproductive/Obstetrics negative OB ROS                            Anesthesia Physical Anesthesia Plan  ASA: II  Anesthesia Plan: General   Post-op Pain Management:  Regional for Post-op pain   Induction: Intravenous  PONV Risk Score and Plan: 3 and Ondansetron, Dexamethasone, Midazolam and Treatment may vary due to age or medical condition  Airway Management Planned: LMA  Additional Equipment:   Intra-op Plan:   Post-operative Plan:   Informed Consent: I have reviewed the patients History and Physical, chart, labs and discussed the procedure including the risks, benefits and alternatives for the proposed anesthesia with the patient or authorized representative who has indicated his/her understanding and acceptance.     Dental advisory given  Plan Discussed with: CRNA  Anesthesia Plan Comments:        Anesthesia  Quick Evaluation

## 2018-12-06 NOTE — Progress Notes (Signed)
Assisted Dr. Carignan with left, ultrasound guided, supraclavicular block. Side rails up, monitors on throughout procedure. See vital signs in flow sheet. Tolerated Procedure well. 

## 2018-12-06 NOTE — Transfer of Care (Signed)
Immediate Anesthesia Transfer of Care Note  Patient: Crystal Duarte  Procedure(s) Performed: LEFT ULNAR NERVE DECOMPRESSION, POSSIBLE TRANSPOSITION (Left Elbow)  Patient Location: PACU  Anesthesia Type:GA combined with regional for post-op pain  Level of Consciousness: awake and patient cooperative  Airway & Oxygen Therapy: Patient Spontanous Breathing and Patient connected to face mask oxygen  Post-op Assessment: Report given to RN and Post -op Vital signs reviewed and stable  Post vital signs: Reviewed and stable  Last Vitals:  Vitals Value Taken Time  BP    Temp    Pulse 95 12/06/18 1135  Resp 16 12/06/18 1135  SpO2 95 % 12/06/18 1135  Vitals shown include unvalidated device data.  Last Pain:  Vitals:   12/06/18 0936  TempSrc: Oral  PainSc: 5          Complications: No apparent anesthesia complications

## 2018-12-06 NOTE — Anesthesia Procedure Notes (Signed)
Procedure Name: LMA Insertion Date/Time: 12/06/2018 10:46 AM Performed by: Marrianne Mood, CRNA Pre-anesthesia Checklist: Patient identified, Emergency Drugs available, Suction available, Patient being monitored and Timeout performed Patient Re-evaluated:Patient Re-evaluated prior to induction Oxygen Delivery Method: Circle system utilized Preoxygenation: Pre-oxygenation with 100% oxygen Induction Type: IV induction Ventilation: Mask ventilation without difficulty LMA: LMA inserted LMA Size: 4.0 Number of attempts: 1 Airway Equipment and Method: Bite block Placement Confirmation: positive ETCO2 Tube secured with: Tape Dental Injury: Teeth and Oropharynx as per pre-operative assessment

## 2018-12-06 NOTE — Anesthesia Procedure Notes (Signed)
Anesthesia Regional Block: Supraclavicular block   Pre-Anesthetic Checklist: ,, timeout performed, Correct Patient, Correct Site, Correct Laterality, Correct Procedure, Correct Position, site marked, Risks and benefits discussed,  Surgical consent,  Pre-op evaluation,  At surgeon's request and post-op pain management  Laterality: Left and Upper  Prep: Maximum Sterile Barrier Precautions used, chloraprep       Needles:  Injection technique: Single-shot  Needle Type: Echogenic Stimulator Needle     Needle Length: 10cm      Additional Needles:   Procedures:,,,, ultrasound used (permanent image in chart),,,,  Narrative:  Start time: 12/06/2018 10:10 AM End time: 12/06/2018 10:20 AM Injection made incrementally with aspirations every 5 mL.  Performed by: Personally  Anesthesiologist: Montez Hageman, MD  Additional Notes: Risks, benefits and alternative to block explained extensively.  Patient tolerated procedure well, without complications.

## 2018-12-06 NOTE — Anesthesia Postprocedure Evaluation (Signed)
Anesthesia Post Note  Patient: Crystal Duarte  Procedure(s) Performed: LEFT ULNAR NERVE DECOMPRESSION,   (Left Elbow)     Patient location during evaluation: PACU Anesthesia Type: General Level of consciousness: awake and alert Pain management: pain level controlled Vital Signs Assessment: post-procedure vital signs reviewed and stable Respiratory status: spontaneous breathing, nonlabored ventilation, respiratory function stable and patient connected to nasal cannula oxygen Cardiovascular status: blood pressure returned to baseline and stable Postop Assessment: no apparent nausea or vomiting Anesthetic complications: no    Last Vitals:  Vitals:   12/06/18 1215 12/06/18 1230  BP: 130/71 116/67  Pulse: 87 85  Resp: (!) 21 (!) 23  Temp:    SpO2: 90% 98%    Last Pain:  Vitals:   12/06/18 1230  TempSrc:   PainSc: 4                  Montez Hageman

## 2018-12-06 NOTE — Op Note (Signed)
I assisted Surgeon(s) and Role:    * Leanora Cover, MD - Primary    Daryll Brod, MD on the Procedure(s): LEFT ULNAR NERVE DECOMPRESSION, POSSIBLE TRANSPOSITION on 12/06/2018.  I provided assistance on this case as follows: setup, approach, retraction, identification of the nerve, decompression, closure of the wound and application of the dressing.  Electronically signed by: Daryll Brod, MD Date: 12/06/2018 Time: 11:35 AM

## 2018-12-06 NOTE — Discharge Instructions (Addendum)

## 2018-12-06 NOTE — Op Note (Addendum)
NAME: Crystal Duarte MEDICAL RECORD NO: [id number] DATE OF BIRTH: Crystal Duarte FACILITY: [facility] LOCATION: [facility] PHYSICIAN: Tennis Must, MD   OPERATIVE REPORT   DATE OF PROCEDURE: 12/06/18    PREOPERATIVE DIAGNOSIS:   Left ulnar nerve compression at the elbow   POSTOPERATIVE DIAGNOSIS:   Left ulnar nerve compression at the elbow   PROCEDURE:   Left ulnar nerve decompression at the elbow   SURGEON:  Leanora Cover, M.D.   ASSISTANT: Daryll Brod, MD   ANESTHESIA:  General with regional   INTRAVENOUS FLUIDS:  Per anesthesia flow sheet.   ESTIMATED BLOOD LOSS:  Minimal.   COMPLICATIONS:  None.   SPECIMENS:  none   TOURNIQUET TIME:    Total Tourniquet Time Documented: Upper Arm (Left) - 36 minutes Total: Upper Arm (Left) - 36 minutes    DISPOSITION:  Stable to PACU.   INDICATIONS: 58 year old female with nerve type pain from the elbow radiating down the ulnar side of the forearm into the hand.  Positive nerve conduction studies.  She wishes to undergo ulnar nerve decompression at the elbow. Risks, benefits and alternatives of surgery were discussed including the risks of blood loss, infection, damage to nerves, vessels, tendons, ligaments, bone for surgery, need for additional surgery, complications with wound healing, continued pain, stiffness.  She voiced understanding of these risks and elected to proceed.  OPERATIVE COURSE:  After being identified preoperatively by myself,  the patient and I agreed on the procedure and site of the procedure.  The surgical site was marked.  Surgical consent had been signed. She was given IV antibiotics as preoperative antibiotic prophylaxis. She was transferred to the operating room and placed on the operating table in supine position with the Left upper extremity on an arm board.  General anesthesia was induced by the anesthesiologist. A regional block had been performed by anesthesia in preoperative holding.   Left  upper extremity was prepped and draped in normal sterile orthopedic fashion.  A surgical pause was performed between the surgeons, anesthesia, and operating room staff and all were in agreement as to the patient, procedure, and site of procedure.  Tourniquet at the proximal aspect of the extremity was inflated to 250 mmHg after exsanguination of the arm with an Esmarch bandage.    Incision was made at the ulnar side of the elbow and carried in subcutaneous tissues respirator technique.  Bipolar electrocautery was used to obtain hemostasis.  Subcutaneous tissues were spread.  The ulnar nerve was identified and the fascia over top released.  Osborne's ligament was released.  The fascia over the FCU muscle was released and then the investing fascia surrounding the nerve was released while protecting the nerve with a KMI guide.  The proximal aspect of the nerve was then decompressed again decompressing the fascia and then any investing fascia surrounding the nerve released while protecting the nerve with a K MI guide.  The elbow was placed into flexion and there was no subluxation of the nerve.  The wound was copiously irrigated with sterile saline.  The anterior flap of Osborne's ligament was repaired to the posterior skin flap.  Care was taken to ensure no compression was created.  2 inverted interrupted Vicryl sutures were placed in subcutaneous tissues and skin was closed with a 4-0 nylon in a horizontal mattress fashion.  The wound was dressed with sterile Xeroform 4 x 4 and ABD and wrapped with Kerlix and Ace bandage.  The tourniquet was deflated at 36  minutes.  Fingertips were pink with brisk capillary refill after deflation of tourniquet.  The operative  drapes were broken down.  The patient was awoken from anesthesia safely.  She was transferred back to the stretcher and taken to PACU in stable condition.  I will see her back in the office in 1 week for postoperative followup.  I will give her a prescription  for Norco 5/325 1-2 tabs PO q6 hours prn pain, dispense # 30.   Leanora Cover, MD Electronically signed, 12/06/18

## 2018-12-06 NOTE — H&P (Signed)
Crystal Duarte is an 58 y.o. female.   Chief Complaint: left ulnar nerve compression HPI: 58 yo female with nerve type pain on ulnar side of wrist and forearm. Positive nerve conduction studies.  She wishes to proceed with left ulnar nerve decompression with possible transposition.  Allergies:  Allergies  Allergen Reactions  . Other Nausea And Vomiting    novacaine   . Latuda [Lurasidone Hcl] Anxiety  . Sulfa Antibiotics Rash    Only in sunlight     Past Medical History:  Diagnosis Date  . Coccidioidomycosis, pulmonary ([place])   . Depression   . Hx of adenomatous polyp of colon 12/04/2014  . Major depressive disorder    since 14, electroconvulsive therapy, and transcranial magnetic stimulation recently for 6 weeks every day   . Osteopenia 11/2016   T score -2.2 FRAX 17%/2.4%  . Panic attack   . PONV (postoperative nausea and vomiting)   . VAIN I (vaginal intraepithelial neoplasia grade I) 2015   Positive HPV negative subtype 16, 18/45    Past Surgical History:  Procedure Laterality Date  . ABDOMINAL HYSTERECTOMY     TAH BSO  . BILATERAL VATS ABLATION     vats for biopsy due to infection  . BREAST EXCISIONAL BIOPSY Right    benign  . COLONOSCOPY  2001   hemorrhoidectomy  . LUNG SURGERY  2001   VATS surgery   . OVARIAN CYST REMOVAL  1970's  . VESICOVAGINAL FISTULA CLOSURE W/ TAH  2005  . WRIST ARTHROSCOPY Left 03/13/2018   Procedure: ARTHROSCOPY LEFT WRIST WITH DEBRIDEMENT;  Surgeon: Leanora Cover, MD;  Location: [facility];  Service: Orthopedics;  Laterality: Left;  block    Family History: Family History  Problem Relation Age of Onset  . Asthma Mother   . Ovarian cancer Mother   . Cancer Mother        OVARIAN  . Varicose Veins Mother   . Heart disease Father   . Peripheral vascular disease Father   . Cancer Maternal Grandmother        LUNG- LUNG  . Colon cancer Neg Hx     Social History:   reports that she has been smoking cigarettes.  She has a 10.50 pack-year smoking history. She quit smokeless tobacco use about 2 years ago. She reports that she does not drink alcohol or use drugs.  Medications: No medications prior to admission.    Results for orders placed or performed during the hospital encounter of 12/04/18 (from the past 48 hour(s))  SARS CORONAVIRUS 2 (TAT 6-24 HRS) Nasopharyngeal Nasopharyngeal Swab     Status: None   Collection Time: 12/04/18 10:53 AM   Specimen: Nasopharyngeal Swab  Result Value Ref Range   SARS Coronavirus 2 NEGATIVE NEGATIVE    Comment: (NOTE) SARS-CoV-2 target nucleic acids are NOT DETECTED. The SARS-CoV-2 RNA is generally detectable in upper and lower respiratory specimens during the acute phase of infection. Negative results do not preclude SARS-CoV-2 infection, do not rule out co-infections with other pathogens, and should not be used as the sole basis for treatment or other patient management decisions. Negative results must be combined with clinical observations, patient history, and epidemiological information. The expected result is Negative. Fact Sheet for Patients: SugarRoll.be Fact Sheet for Healthcare Providers: https://www.woods-mathews.com/ This test is not yet approved or cleared by the [place] FDA and  has been authorized for detection and/or diagnosis of SARS-CoV-2 by FDA under an Emergency Use Authorization (EUA). This EUA will  remain  in effect (meaning this test can be used) for the duration of the COVID-19 declaration under Section 56 4(b)(1) of the Act, 21 U.S.C. section 360bbb-3(b)(1), unless the authorization is terminated or revoked sooner. Performed at [facility], [place] [address], [place], [place] [address]     No results found.   A comprehensive review of systems was negative.  Height 5\' 5"  (1.651 m), weight 83.9 kg.  General appearance: alert, cooperative and appears stated age Head:  Normocephalic, without obvious abnormality, atraumatic Neck: supple, symmetrical, trachea midline Cardio: regular rate and rhythm Resp: clear to auscultation bilaterally Extremities: Intact sensation and capillary refill all digits.  +epl/fpl/io.  No wounds.  Pulses: 2+ and symmetric Skin: Skin color, texture, turgor normal. No rashes or lesions Neurologic: Grossly normal Incision/Wound: none  Assessment/Plan Left ulnar nerve compression at elbow.  Non operative and operative treatment options have been discussed with the patient and patient wishes to proceed with operative treatment. Risks, benefits, and alternatives of surgery have been discussed and the patient agrees with the plan of care.   Leanora Cover 12/06/2018, 8:25 AM

## 2018-12-07 ENCOUNTER — Encounter (HOSPITAL_BASED_OUTPATIENT_CLINIC_OR_DEPARTMENT_OTHER): Payer: Self-pay | Admitting: Orthopedic Surgery

## 2018-12-18 ENCOUNTER — Ambulatory Visit (INDEPENDENT_AMBULATORY_CARE_PROVIDER_SITE_OTHER): Payer: Medicare HMO | Admitting: Internal Medicine

## 2018-12-18 ENCOUNTER — Encounter: Payer: Self-pay | Admitting: Internal Medicine

## 2018-12-18 DIAGNOSIS — R69 Illness, unspecified: Secondary | ICD-10-CM | POA: Diagnosis not present

## 2018-12-18 DIAGNOSIS — J441 Chronic obstructive pulmonary disease with (acute) exacerbation: Secondary | ICD-10-CM

## 2018-12-18 DIAGNOSIS — F17200 Nicotine dependence, unspecified, uncomplicated: Secondary | ICD-10-CM

## 2018-12-18 MED ORDER — ANORO ELLIPTA 62.5-25 MCG/INH IN AEPB: 1.0000 | INHALATION_SPRAY | Freq: Every day | RESPIRATORY_TRACT | 3 refills | Status: DC

## 2018-12-18 NOTE — Progress Notes (Signed)
Virtual Visit via Video Note  I connected with Crystal Duarte on 12/18/18 at 10:40 AM EDT by a video enabled telemedicine application and verified that I am speaking with the correct person using two identifiers.  The patient and the provider were at separate locations throughout the entire encounter.   I discussed the limitations of evaluation and management by telemedicine and the availability of in person appointments. The patient expressed understanding and agreed to proceed.  History of Present Illness: The patient is a 58 y.o. female with visit for SOB. Started since she has been smoking more. The pandemic has caused her to resume smoking and even more than ever. She is aware that this is not good for her lungs. She cannot take chantix due to meds and had reaction with wellbutrin with skin crawling. She will get some patches as she wants to stop again. She is coughing some stable for the last several months. She is getting SOB with exertion. She has albuterol inhaler at home and this has not helped much. She is limiting activities some due to SOB. Denies fevers or chills or change in cough or SOB recently. Recent surgery and was tested for covid-19 prior to this and it was negative so she does not feel like that is related. Overall it is stable but worse than usual. Has tried albuterol  Observations/Objective: Appearance: normal, breathing appears normal, minimal coughing during visit, casual grooming, abdomen does not appear distended, throat normal, memory normal, mental status is A and O times 3  Assessment and Plan: See problem oriented charting  Follow Up Instructions: rx anoro and advised to stop smoking  I discussed the assessment and treatment plan with the patient. The patient was provided an opportunity to ask questions and all were answered. The patient agreed with the plan and demonstrated an understanding of the instructions.   The patient was advised to call back or seek an  in-person evaluation if the symptoms worsen or if the condition fails to improve as anticipated.  [name], MD

## 2018-12-18 NOTE — Assessment & Plan Note (Signed)
Will rx anoro to see if this helps. Advised to quit smoking as this will help most. Recent covid-19 testing negative. Can use albuterol prn but she is not getting much benefit from this.

## 2018-12-18 NOTE — Assessment & Plan Note (Signed)
Time spent counseling about tobacco usage: 4 minutes. I have asked about smoking and is smoking more than usual. The patient is advised to quit. The patient is willing to quit. They would like to try to quit in the next 3 months. We will follow up with them in 3 months.

## 2018-12-27 DIAGNOSIS — R69 Illness, unspecified: Secondary | ICD-10-CM | POA: Diagnosis not present

## 2018-12-31 ENCOUNTER — Encounter: Payer: Medicare HMO | Admitting: Gynecology

## 2019-01-01 ENCOUNTER — Other Ambulatory Visit: Payer: Self-pay

## 2019-01-01 ENCOUNTER — Encounter: Payer: Self-pay | Admitting: Gynecology

## 2019-01-01 DIAGNOSIS — R69 Illness, unspecified: Secondary | ICD-10-CM | POA: Diagnosis not present

## 2019-01-02 ENCOUNTER — Encounter: Payer: Self-pay | Admitting: Gynecology

## 2019-01-02 ENCOUNTER — Ambulatory Visit (INDEPENDENT_AMBULATORY_CARE_PROVIDER_SITE_OTHER): Payer: Medicare HMO | Admitting: Gynecology

## 2019-01-02 ENCOUNTER — Other Ambulatory Visit: Payer: Self-pay

## 2019-01-02 VITALS — BP 122/76 | Ht 64.0 in | Wt 189.0 lb

## 2019-01-02 DIAGNOSIS — Z9189 Other specified personal risk factors, not elsewhere classified: Secondary | ICD-10-CM

## 2019-01-02 DIAGNOSIS — Z01419 Encounter for gynecological examination (general) (routine) without abnormal findings: Secondary | ICD-10-CM | POA: Diagnosis not present

## 2019-01-02 DIAGNOSIS — N952 Postmenopausal atrophic vaginitis: Secondary | ICD-10-CM

## 2019-01-02 DIAGNOSIS — M8580 Other specified disorders of bone density and structure, unspecified site: Secondary | ICD-10-CM

## 2019-01-02 DIAGNOSIS — N907 Vulvar cyst: Secondary | ICD-10-CM

## 2019-01-02 NOTE — Patient Instructions (Signed)
Follow-up for your bone density as scheduled.  Your mammogram is due at the end of October.  Follow-up in 1 year for annual exam

## 2019-01-02 NOTE — Progress Notes (Signed)
    Crystal Duarte Oct 22, 1960 [id number]        58 y.o.  G2P2 for next and pelvic exam.  Without gynecologic complaints.  Past medical history,surgical history, problem list, medications, allergies, family history and social history were all reviewed and documented as reviewed in the EPIC chart.  ROS:  Performed with pertinent positives and negatives included in the history, assessment and plan.   Additional significant findings : None   Exam: [name] assistant Vitals:   01/02/19 1124  BP: 122/76  Weight: 189 lb (85.7 kg)  Height: 5\' 4"  (1.626 m)   Body mass index is 32.44 kg/m.  General appearance:  Normal affect, orientation and appearance. Skin: Grossly normal HEENT: Without gross lesions.  No cervical or supraclavicular adenopathy. Thyroid normal.  Lungs:  Clear without wheezing, rales or rhonchi Cardiac: RR, without RMG Abdominal:  Soft, nontender, without masses, guarding, rebound, organomegaly or hernia Breasts:  Examined lying and sitting without masses, retractions, discharge or axillary adenopathy. Pelvic:  Ext, BUS, Vagina: With atrophic changes.  Scattered classic sebaceous cysts along both labia majora  Adnexa: Without masses or tenderness    Anus and perineum: Normal   Rectovaginal: Normal sphincter tone without palpated masses or tenderness.    Assessment/Plan:  58 y.o. G2P2 female for breast and pelvic exam.  Status post hysterectomy BSO in the past  1. Postmenopausal.  No significant menopausal symptoms. 2. History of VAIN 1 positive high risk HPV negative subtype 16, 18/45 and 2015.  Follow-up Pap smears have been negative to include a negative HPV screen.  Last Pap smear 2019.  No Pap smear done today.  We will plan continued surveillance for now at less frequent screening interval. 3. Scattered sebaceous cyst both labia majora.  Patient asked if she could do anything about these.  They are not bothersome to the patient.  Recommend no intervention  at this time. 4. Mammography coming due end of October and I reminded her to schedule this.  Breast exam normal today. 5. Colonoscopy 2016.  Repeat at their recommended interval. 6. Osteopenia.  DEXA 2018 T score -2.2 FRAX 17% / 2.4%.  The patient has a DEXA scheduled and she will follow-up for this. 7. Health maintenance.  No routine lab work done as patient does this elsewhere.  Follow-up 1 year, sooner as needed.   Anastasio Auerbach MD, 11:51 AM 01/02/2019

## 2019-01-03 ENCOUNTER — Other Ambulatory Visit: Payer: Self-pay | Admitting: *Deleted

## 2019-01-03 ENCOUNTER — Ambulatory Visit (INDEPENDENT_AMBULATORY_CARE_PROVIDER_SITE_OTHER): Payer: Medicare HMO

## 2019-01-03 DIAGNOSIS — M8580 Other specified disorders of bone density and structure, unspecified site: Secondary | ICD-10-CM

## 2019-01-03 DIAGNOSIS — Z780 Asymptomatic menopausal state: Secondary | ICD-10-CM

## 2019-01-03 DIAGNOSIS — M8589 Other specified disorders of bone density and structure, multiple sites: Secondary | ICD-10-CM

## 2019-01-04 ENCOUNTER — Encounter: Payer: Self-pay | Admitting: Gynecology

## 2019-01-04 ENCOUNTER — Other Ambulatory Visit: Payer: Self-pay | Admitting: Gynecology

## 2019-01-04 DIAGNOSIS — M8589 Other specified disorders of bone density and structure, multiple sites: Secondary | ICD-10-CM

## 2019-01-04 DIAGNOSIS — Z780 Asymptomatic menopausal state: Secondary | ICD-10-CM

## 2019-01-10 DIAGNOSIS — G5621 Lesion of ulnar nerve, right upper limb: Secondary | ICD-10-CM | POA: Diagnosis not present

## 2019-01-11 ENCOUNTER — Encounter: Payer: Medicare HMO | Admitting: Internal Medicine

## 2019-01-14 DIAGNOSIS — M542 Cervicalgia: Secondary | ICD-10-CM | POA: Insufficient documentation

## 2019-01-14 DIAGNOSIS — M5412 Radiculopathy, cervical region: Secondary | ICD-10-CM | POA: Diagnosis not present

## 2019-01-14 DIAGNOSIS — M7918 Myalgia, other site: Secondary | ICD-10-CM | POA: Diagnosis not present

## 2019-01-14 DIAGNOSIS — G5621 Lesion of ulnar nerve, right upper limb: Secondary | ICD-10-CM | POA: Insufficient documentation

## 2019-01-14 DIAGNOSIS — R030 Elevated blood-pressure reading, without diagnosis of hypertension: Secondary | ICD-10-CM | POA: Insufficient documentation

## 2019-01-14 DIAGNOSIS — R69 Illness, unspecified: Secondary | ICD-10-CM | POA: Diagnosis not present

## 2019-01-15 DIAGNOSIS — R69 Illness, unspecified: Secondary | ICD-10-CM | POA: Diagnosis not present

## 2019-01-16 DIAGNOSIS — R69 Illness, unspecified: Secondary | ICD-10-CM | POA: Diagnosis not present

## 2019-01-18 DIAGNOSIS — H25813 Combined forms of age-related cataract, bilateral: Secondary | ICD-10-CM | POA: Diagnosis not present

## 2019-01-21 ENCOUNTER — Other Ambulatory Visit: Payer: Self-pay

## 2019-01-21 ENCOUNTER — Encounter: Payer: Self-pay | Admitting: Internal Medicine

## 2019-01-21 ENCOUNTER — Ambulatory Visit (INDEPENDENT_AMBULATORY_CARE_PROVIDER_SITE_OTHER): Payer: Medicare HMO | Admitting: Internal Medicine

## 2019-01-21 ENCOUNTER — Other Ambulatory Visit (INDEPENDENT_AMBULATORY_CARE_PROVIDER_SITE_OTHER): Payer: Medicare HMO

## 2019-01-21 VITALS — BP 140/90 | HR 76 | Temp 98.5°F | Ht 64.0 in | Wt 189.0 lb

## 2019-01-21 DIAGNOSIS — F17200 Nicotine dependence, unspecified, uncomplicated: Secondary | ICD-10-CM

## 2019-01-21 DIAGNOSIS — Z0000 Encounter for general adult medical examination without abnormal findings: Secondary | ICD-10-CM

## 2019-01-21 DIAGNOSIS — J42 Unspecified chronic bronchitis: Secondary | ICD-10-CM

## 2019-01-21 DIAGNOSIS — F332 Major depressive disorder, recurrent severe without psychotic features: Secondary | ICD-10-CM | POA: Diagnosis not present

## 2019-01-21 DIAGNOSIS — Z23 Encounter for immunization: Secondary | ICD-10-CM

## 2019-01-21 DIAGNOSIS — R69 Illness, unspecified: Secondary | ICD-10-CM | POA: Diagnosis not present

## 2019-01-21 LAB — COMPREHENSIVE METABOLIC PANEL
ALT: 13 U/L (ref 0–35)
AST: 13 U/L (ref 0–37)
Albumin: 4.3 g/dL (ref 3.5–5.2)
Alkaline Phosphatase: 97 U/L (ref 39–117)
BUN: 13 mg/dL (ref 6–23)
CO2: 27 mEq/L (ref 19–32)
Calcium: 9.5 mg/dL (ref 8.4–10.5)
Chloride: 103 mEq/L (ref 96–112)
Creatinine, Ser: 0.76 mg/dL (ref 0.40–1.20)
GFR: 77.74 mL/min (ref >60.00)
Glucose, Bld: 97 mg/dL (ref 70–99)
Potassium: 4.3 mEq/L (ref 3.5–5.1)
Sodium: 139 mEq/L (ref 135–145)
Total Bilirubin: 0.3 mg/dL (ref 0.2–1.2)
Total Protein: 6.7 g/dL (ref 6.0–8.3)

## 2019-01-21 LAB — CBC
HCT: 40.6 % (ref 36.0–46.0)
Hemoglobin: 13.3 g/dL (ref 12.0–15.0)
MCHC: 32.8 g/dL (ref 30.0–36.0)
MCV: 95.4 fl (ref 78.0–100.0)
Platelets: 291 10*3/uL (ref 150.0–400.0)
RBC: 4.26 Mil/uL (ref 3.87–5.11)
RDW: 13.9 % (ref 11.5–15.5)
WBC: 12.4 10*3/uL — ABNORMAL HIGH (ref 4.0–10.5)

## 2019-01-21 LAB — LIPID PANEL
Cholesterol: 185 mg/dL (ref 0–200)
HDL: 57.9 mg/dL (ref >39.00)
LDL Cholesterol: 90 mg/dL (ref 0–99)
NonHDL: 127.15
Total CHOL/HDL Ratio: 3
Triglycerides: 186 mg/dL — ABNORMAL HIGH (ref 0.0–149.0)
VLDL: 37.2 mg/dL (ref 0.0–40.0)

## 2019-01-21 LAB — HEMOGLOBIN A1C: Hgb A1c MFr Bld: 6.1 % (ref 4.6–6.5)

## 2019-01-21 LAB — VITAMIN D 25 HYDROXY (VIT D DEFICIENCY, FRACTURES): VITD: 36.74 ng/mL (ref 30.00–100.00)

## 2019-01-21 LAB — TSH: TSH: 2.12 u[IU]/mL (ref 0.35–4.50)

## 2019-01-21 MED ORDER — NYSTATIN-TRIAMCINOLONE 100000-0.1 UNIT/GM-% EX OINT: 1.0000 "application " | TOPICAL_OINTMENT | Freq: Two times a day (BID) | CUTANEOUS | 0 refills | Status: DC

## 2019-01-21 NOTE — Patient Instructions (Signed)
Health Maintenance, Female Adopting a healthy lifestyle and getting preventive care are important in promoting health and wellness. Ask your health care provider about:  The right schedule for you to have regular tests and exams.  Things you can do on your own to prevent diseases and keep yourself healthy. What should I know about diet, weight, and exercise? Eat a healthy diet   Eat a diet that includes plenty of vegetables, fruits, low-fat dairy products, and lean protein.  Do not eat a lot of foods that are high in solid fats, added sugars, or sodium. Maintain a healthy weight Body mass index (BMI) is used to identify weight problems. It estimates body fat based on height and weight. Your health care provider can help determine your BMI and help you achieve or maintain a healthy weight. Get regular exercise Get regular exercise. This is one of the most important things you can do for your health. Most adults should:  Exercise for at least 150 minutes each week. The exercise should increase your heart rate and make you sweat (moderate-intensity exercise).  Do strengthening exercises at least twice a week. This is in addition to the moderate-intensity exercise.  Spend less time sitting. Even light physical activity can be beneficial. Watch cholesterol and blood lipids Have your blood tested for lipids and cholesterol at 59 years of age, then have this test every 5 years. Have your cholesterol levels checked more often if:  Your lipid or cholesterol levels are high.  You are older than 59 years of age.  You are at high risk for heart disease. What should I know about cancer screening? Depending on your health history and family history, you may need to have cancer screening at various ages. This may include screening for:  Breast cancer.  Cervical cancer.  Colorectal cancer.  Skin cancer.  Lung cancer. What should I know about heart disease, diabetes, and high blood  pressure? Blood pressure and heart disease  High blood pressure causes heart disease and increases the risk of stroke. This is more likely to develop in people who have high blood pressure readings, are of African descent, or are overweight.  Have your blood pressure checked: ? Every 3-5 years if you are 18-39 years of age. ? Every year if you are 40 years old or older. Diabetes Have regular diabetes screenings. This checks your fasting blood sugar level. Have the screening done:  Once every three years after age 40 if you are at a normal weight and have a low risk for diabetes.  More often and at a younger age if you are overweight or have a high risk for diabetes. What should I know about preventing infection? Hepatitis B If you have a higher risk for hepatitis B, you should be screened for this virus. Talk with your health care provider to find out if you are at risk for hepatitis B infection. Hepatitis C Testing is recommended for:  Everyone born from 1945 through 1965.  Anyone with known risk factors for hepatitis C. Sexually transmitted infections (STIs)  Get screened for STIs, including gonorrhea and chlamydia, if: ? You are sexually active and are younger than 59 years of age. ? You are older than 59 years of age and your health care provider tells you that you are at risk for this type of infection. ? Your sexual activity has changed since you were last screened, and you are at increased risk for chlamydia or gonorrhea. Ask your health care provider if   you are at risk.  Ask your health care provider about whether you are at high risk for HIV. Your health care provider may recommend a prescription medicine to help prevent HIV infection. If you choose to take medicine to prevent HIV, you should first get tested for HIV. You should then be tested every 3 months for as long as you are taking the medicine. Pregnancy  If you are about to stop having your period (premenopausal) and  you may become pregnant, seek counseling before you get pregnant.  Take 400 to 800 micrograms (mcg) of folic acid every day if you become pregnant.  Ask for birth control (contraception) if you want to prevent pregnancy. Osteoporosis and menopause Osteoporosis is a disease in which the bones lose minerals and strength with aging. This can result in bone fractures. If you are 65 years old or older, or if you are at risk for osteoporosis and fractures, ask your health care provider if you should:  Be screened for bone loss.  Take a calcium or vitamin D supplement to lower your risk of fractures.  Be given hormone replacement therapy (HRT) to treat symptoms of menopause. Follow these instructions at home: Lifestyle  Do not use any products that contain nicotine or tobacco, such as cigarettes, e-cigarettes, and chewing tobacco. If you need help quitting, ask your health care provider.  Do not use street drugs.  Do not share needles.  Ask your health care provider for help if you need support or information about quitting drugs. Alcohol use  Do not drink alcohol if: ? Your health care provider tells you not to drink. ? You are pregnant, may be pregnant, or are planning to become pregnant.  If you drink alcohol: ? Limit how much you use to 0-1 drink a day. ? Limit intake if you are breastfeeding.  Be aware of how much alcohol is in your drink. In the [place] one drink equals one 12 oz bottle of beer (355 mL), one 5 oz glass of wine (148 mL), or one 1 oz glass of hard liquor (44 mL). General instructions  Schedule regular health, dental, and eye exams.  Stay current with your vaccines.  Tell your health care provider if: ? You often feel depressed. ? You have ever been abused or do not feel safe at home. Summary  Adopting a healthy lifestyle and getting preventive care are important in promoting health and wellness.  Follow your health care provider's instructions about healthy  diet, exercising, and getting tested or screened for diseases.  Follow your health care provider's instructions on monitoring your cholesterol and blood pressure. This information is not intended to replace advice given to you by your health care provider. Make sure you discuss any questions you have with your health care provider. Document Released: 10/04/2010 Document Revised: 03/14/2018 Document Reviewed: 03/14/2018 Elsevier Patient Education  2020 [employer]  

## 2019-01-21 NOTE — Progress Notes (Signed)
   Subjective:   Patient ID: Crystal Duarte, female    DOB: 03-Dec-1960, 58 y.o.   MRN: [id number]  HPI The patient is a 58 YO female coming in for physical.   PMH, [place], social history reviewed and updated  Review of Systems  Constitutional: Negative.   HENT: Negative.   Eyes: Negative.   Respiratory: Negative for cough, chest tightness and shortness of breath.   Cardiovascular: Negative for chest pain, palpitations and leg swelling.  Gastrointestinal: Negative for abdominal distention, abdominal pain, constipation, diarrhea, nausea and vomiting.  Musculoskeletal: Negative.   Skin: Negative.   Neurological: Negative.   Psychiatric/Behavioral: Negative.     Objective:  Physical Exam Constitutional:      Appearance: She is well-developed.  HENT:     Head: Normocephalic and atraumatic.  Neck:     Musculoskeletal: Normal range of motion.  Cardiovascular:     Rate and Rhythm: Normal rate and regular rhythm.  Pulmonary:     Effort: Pulmonary effort is normal. No respiratory distress.     Breath sounds: Normal breath sounds. No wheezing or rales.  Abdominal:     General: Bowel sounds are normal. There is no distension.     Palpations: Abdomen is soft.     Tenderness: There is no abdominal tenderness. There is no rebound.  Skin:    General: Skin is warm and dry.  Neurological:     Mental Status: She is alert and oriented to person, place, and time.     Coordination: Coordination normal.     Vitals:   01/21/19 1310  BP: 140/90  Pulse: 76  Temp: 98.5 F (36.9 C)  TempSrc: Oral  SpO2: 97%  Weight: 189 lb (85.7 kg)  Height: 5\' 4"  (1.626 m)    Assessment & Plan:  Flu shot given at visit

## 2019-01-22 DIAGNOSIS — R69 Illness, unspecified: Secondary | ICD-10-CM | POA: Diagnosis not present

## 2019-01-22 NOTE — Assessment & Plan Note (Signed)
Still smoking and advised to quit. Taking anoro which is helping well for SOB.

## 2019-01-22 NOTE — Assessment & Plan Note (Signed)
Stable on meds from psych.

## 2019-01-22 NOTE — Assessment & Plan Note (Signed)
Flu shot given. Pneumonia up to date. Shingrix complete. Tetanus up to date. Colonoscopy up to date. Mammogram up to date, pap smear up to date. Counseled about sun safety and mole surveillance. Counseled about the dangers of distracted driving. Given 10 year screening recommendations.

## 2019-01-22 NOTE — Assessment & Plan Note (Signed)
Counseled to quit and she has cut back some but due to pandemic she does not feel able to quit right now and with weight as well.

## 2019-01-23 DIAGNOSIS — R69 Illness, unspecified: Secondary | ICD-10-CM | POA: Diagnosis not present

## 2019-01-24 DIAGNOSIS — R69 Illness, unspecified: Secondary | ICD-10-CM | POA: Diagnosis not present

## 2019-01-28 DIAGNOSIS — R69 Illness, unspecified: Secondary | ICD-10-CM | POA: Diagnosis not present

## 2019-01-29 ENCOUNTER — Other Ambulatory Visit: Payer: Self-pay | Admitting: Gynecology

## 2019-01-29 DIAGNOSIS — F411 Generalized anxiety disorder: Secondary | ICD-10-CM | POA: Diagnosis not present

## 2019-01-29 DIAGNOSIS — Z1231 Encounter for screening mammogram for malignant neoplasm of breast: Secondary | ICD-10-CM

## 2019-01-29 DIAGNOSIS — R69 Illness, unspecified: Secondary | ICD-10-CM | POA: Diagnosis not present

## 2019-01-30 DIAGNOSIS — R69 Illness, unspecified: Secondary | ICD-10-CM | POA: Diagnosis not present

## 2019-02-01 DIAGNOSIS — R69 Illness, unspecified: Secondary | ICD-10-CM | POA: Diagnosis not present

## 2019-02-04 DIAGNOSIS — R69 Illness, unspecified: Secondary | ICD-10-CM | POA: Diagnosis not present

## 2019-02-05 DIAGNOSIS — R69 Illness, unspecified: Secondary | ICD-10-CM | POA: Diagnosis not present

## 2019-02-06 DIAGNOSIS — R69 Illness, unspecified: Secondary | ICD-10-CM | POA: Diagnosis not present

## 2019-02-08 DIAGNOSIS — R69 Illness, unspecified: Secondary | ICD-10-CM | POA: Diagnosis not present

## 2019-02-11 DIAGNOSIS — R69 Illness, unspecified: Secondary | ICD-10-CM | POA: Diagnosis not present

## 2019-02-12 DIAGNOSIS — R69 Illness, unspecified: Secondary | ICD-10-CM | POA: Diagnosis not present

## 2019-02-13 DIAGNOSIS — R69 Illness, unspecified: Secondary | ICD-10-CM | POA: Diagnosis not present

## 2019-02-14 DIAGNOSIS — R69 Illness, unspecified: Secondary | ICD-10-CM | POA: Diagnosis not present

## 2019-02-15 DIAGNOSIS — R69 Illness, unspecified: Secondary | ICD-10-CM | POA: Diagnosis not present

## 2019-02-18 DIAGNOSIS — R69 Illness, unspecified: Secondary | ICD-10-CM | POA: Diagnosis not present

## 2019-02-20 DIAGNOSIS — H25011 Cortical age-related cataract, right eye: Secondary | ICD-10-CM | POA: Diagnosis not present

## 2019-02-20 DIAGNOSIS — H25012 Cortical age-related cataract, left eye: Secondary | ICD-10-CM | POA: Diagnosis not present

## 2019-02-20 DIAGNOSIS — H2512 Age-related nuclear cataract, left eye: Secondary | ICD-10-CM | POA: Diagnosis not present

## 2019-02-20 DIAGNOSIS — H2511 Age-related nuclear cataract, right eye: Secondary | ICD-10-CM | POA: Diagnosis not present

## 2019-02-27 DIAGNOSIS — H25012 Cortical age-related cataract, left eye: Secondary | ICD-10-CM | POA: Diagnosis not present

## 2019-02-27 DIAGNOSIS — H2512 Age-related nuclear cataract, left eye: Secondary | ICD-10-CM | POA: Diagnosis not present

## 2019-03-21 ENCOUNTER — Other Ambulatory Visit: Payer: Self-pay

## 2019-03-21 ENCOUNTER — Ambulatory Visit
Admission: RE | Admit: 2019-03-21 | Discharge: 2019-03-21 | Disposition: A | Discharge status: Home / Self Care | Payer: Medicare HMO | Source: Ambulatory Visit | Attending: Gynecology | Admitting: Gynecology

## 2019-03-21 DIAGNOSIS — Z1231 Encounter for screening mammogram for malignant neoplasm of breast: Secondary | ICD-10-CM

## 2019-03-22 ENCOUNTER — Other Ambulatory Visit: Payer: Self-pay | Admitting: Gynecology

## 2019-03-22 DIAGNOSIS — N644 Mastodynia: Secondary | ICD-10-CM

## 2019-04-02 ENCOUNTER — Ambulatory Visit
Admission: RE | Admit: 2019-04-02 | Discharge: 2019-04-02 | Disposition: A | Discharge status: Home / Self Care | Payer: Medicare HMO | Source: Ambulatory Visit | Attending: Gynecology | Admitting: Gynecology

## 2019-04-02 ENCOUNTER — Other Ambulatory Visit: Payer: Self-pay

## 2019-04-02 DIAGNOSIS — N644 Mastodynia: Secondary | ICD-10-CM

## 2019-04-02 DIAGNOSIS — R928 Other abnormal and inconclusive findings on diagnostic imaging of breast: Secondary | ICD-10-CM | POA: Diagnosis not present

## 2019-04-02 DIAGNOSIS — N6489 Other specified disorders of breast: Secondary | ICD-10-CM | POA: Diagnosis not present

## 2019-04-03 DIAGNOSIS — Z961 Presence of intraocular lens: Secondary | ICD-10-CM | POA: Diagnosis not present

## 2019-04-03 DIAGNOSIS — Z0100 Encounter for examination of eyes and vision without abnormal findings: Secondary | ICD-10-CM | POA: Diagnosis not present

## 2019-04-03 IMAGING — DX DG CHEST 2V
2 series · 2 of 2 positions shown · non-contrast
Comparison: 12/26/2016

CLINICAL DATA: Cough

EXAM:
CHEST - 2 VIEW

[chest pa]
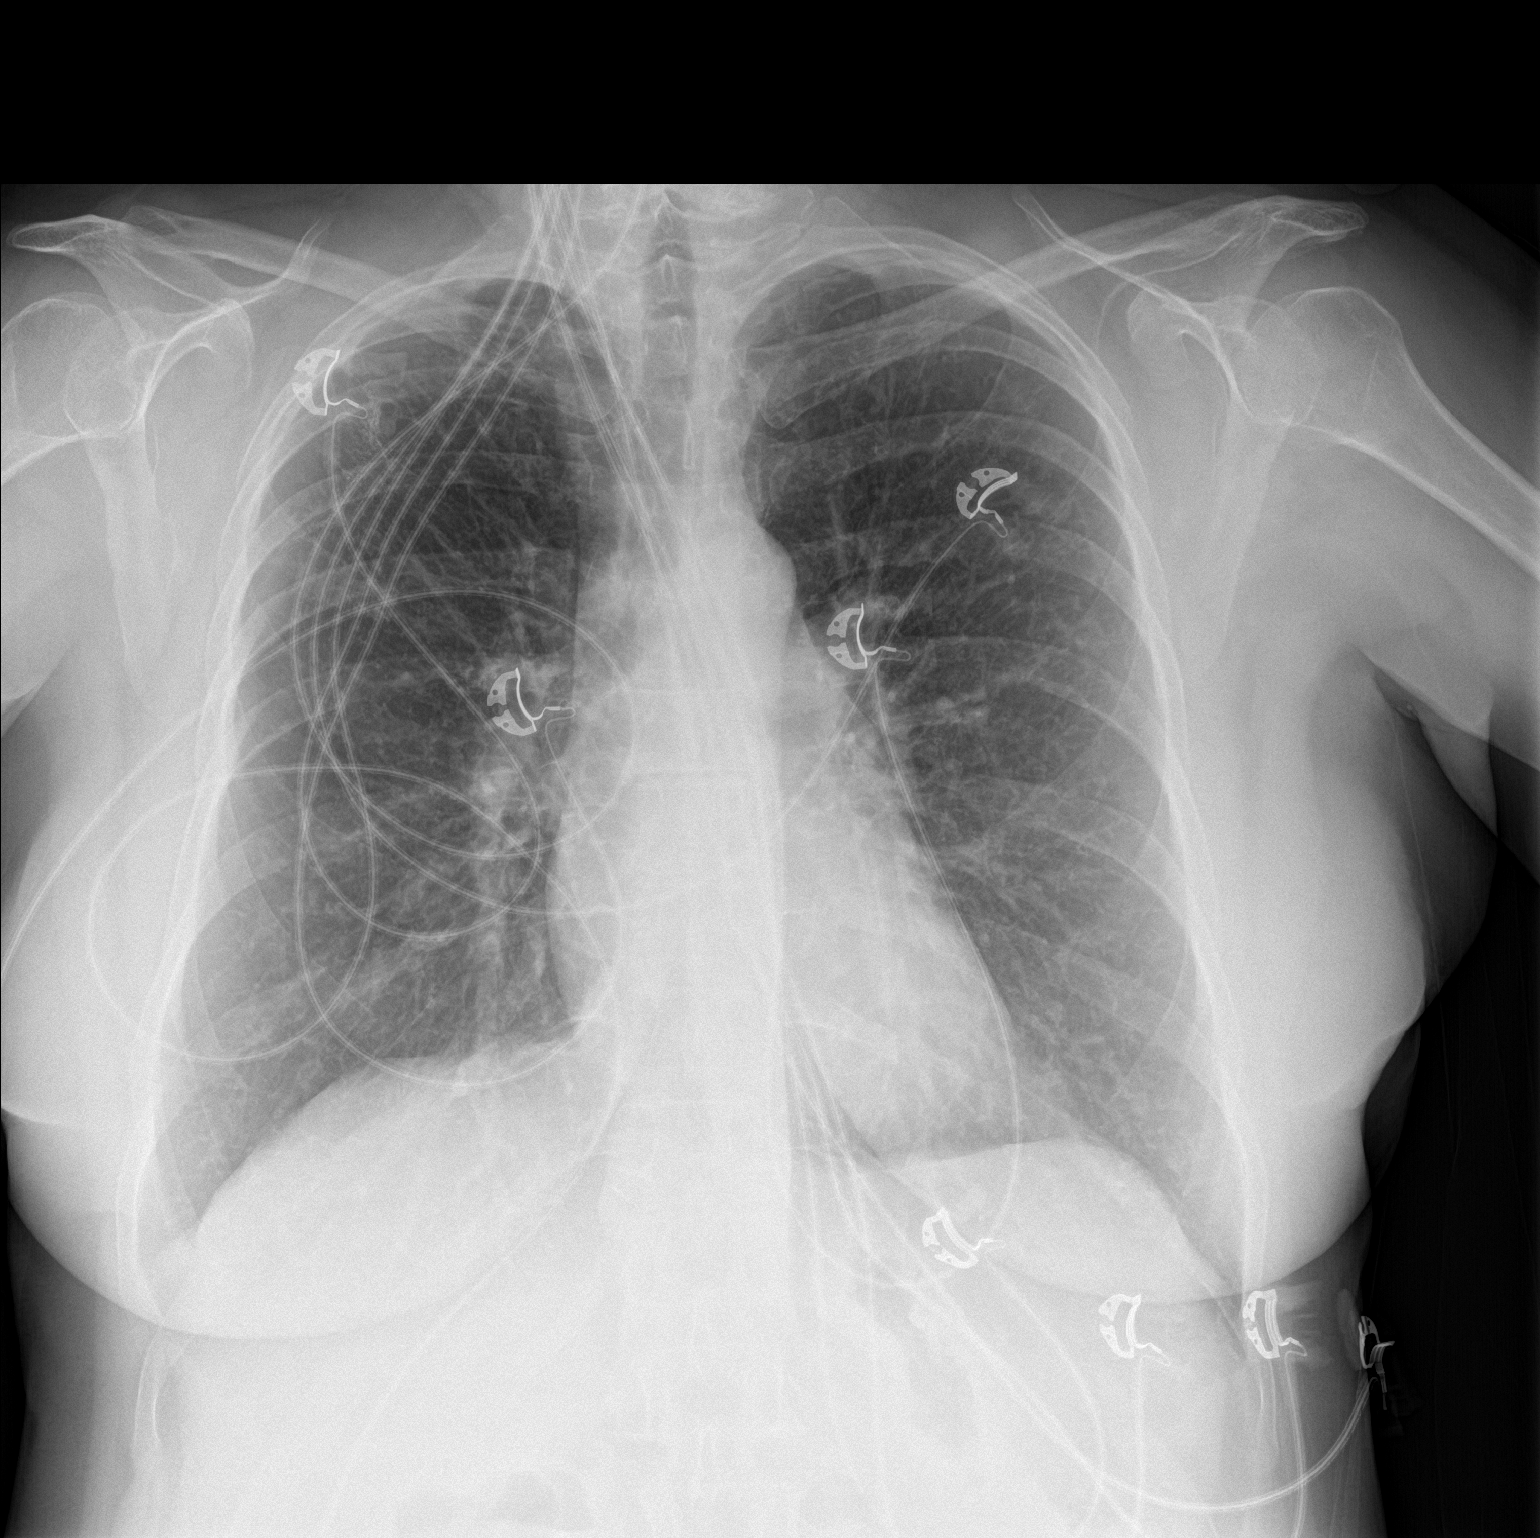

[chest lat]
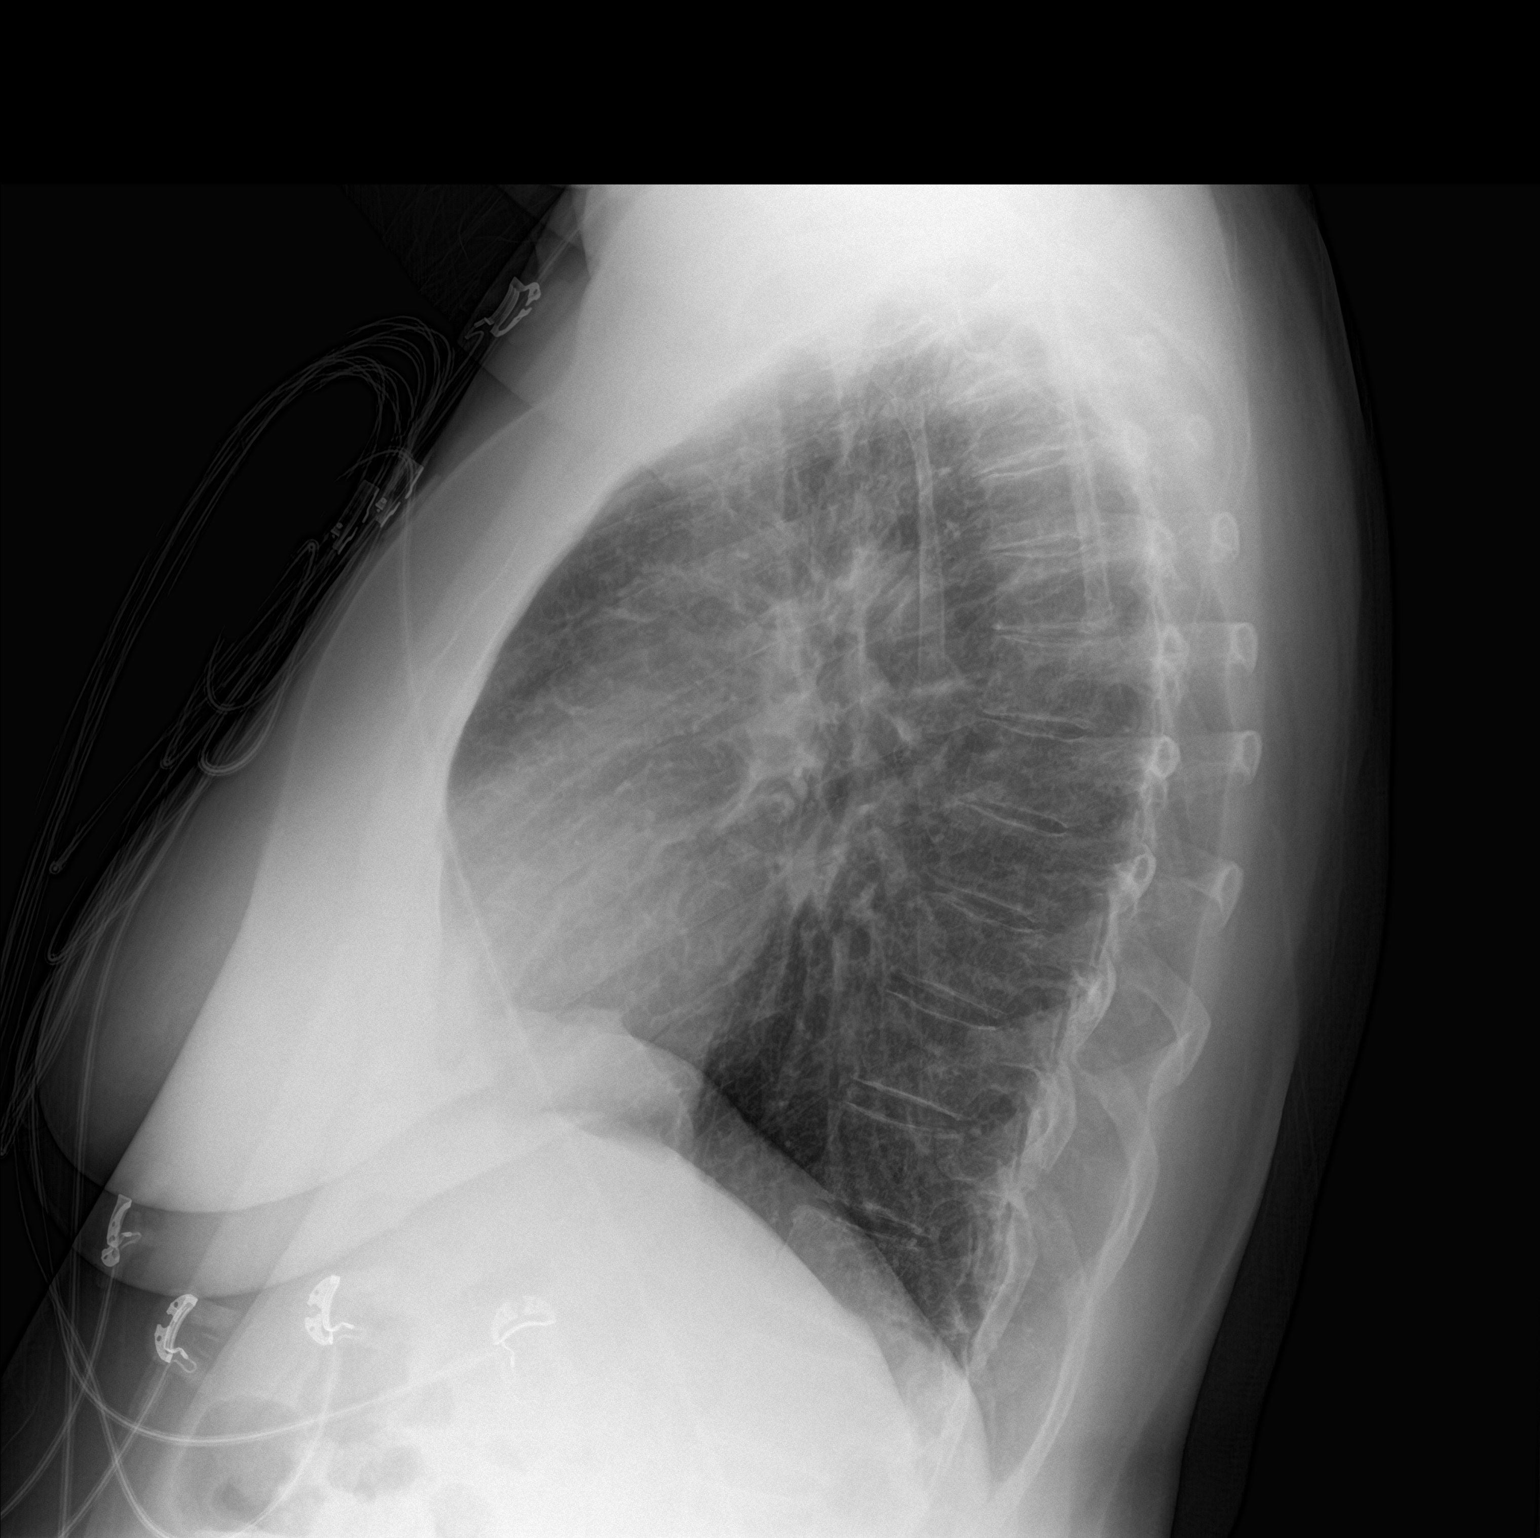

[2 of 2 positions shown; findings below may reference images not displayed]

FINDINGS: The heart size and mediastinal contours are within normal limits.
Both lungs are clear. Postsurgical changes at the right lung apex.
IMPRESSION: No active cardiopulmonary disease.

## 2019-04-08 ENCOUNTER — Other Ambulatory Visit: Payer: Self-pay | Admitting: Internal Medicine

## 2019-04-08 MED ORDER — ANORO ELLIPTA 62.5-25 MCG/INH IN AEPB: 1.0000 | INHALATION_SPRAY | Freq: Every day | RESPIRATORY_TRACT | 3 refills | Status: DC

## 2019-04-08 NOTE — Telephone Encounter (Signed)
Medication Refill - Medication:  umeclidinium-vilanterol (ANORO ELLIPTA) 62.5-25 MCG/INH AEPB   Has the patient contacted their pharmacy? Yes advised to call office.   Preferred Pharmacy (with phone number or street name):  [facility] [address], [place] Phone:  [phone]  Fax:  [phone]     Agent: Please be advised that RX refills may take up to 3 business days. We ask that you follow-up with your pharmacy.

## 2019-04-11 DIAGNOSIS — H524 Presbyopia: Secondary | ICD-10-CM | POA: Diagnosis not present

## 2019-04-11 DIAGNOSIS — Z0100 Encounter for examination of eyes and vision without abnormal findings: Secondary | ICD-10-CM | POA: Diagnosis not present

## 2019-04-20 IMAGING — DX DG WRIST 2V*L*
2 series · 2 of 2 positions shown · non-contrast
Comparison: 10/13/2017

CLINICAL DATA: Pain and swelling after fracture 10/13/2017.

EXAM:
LEFT WRIST - 2 VIEW

[wrist pa]
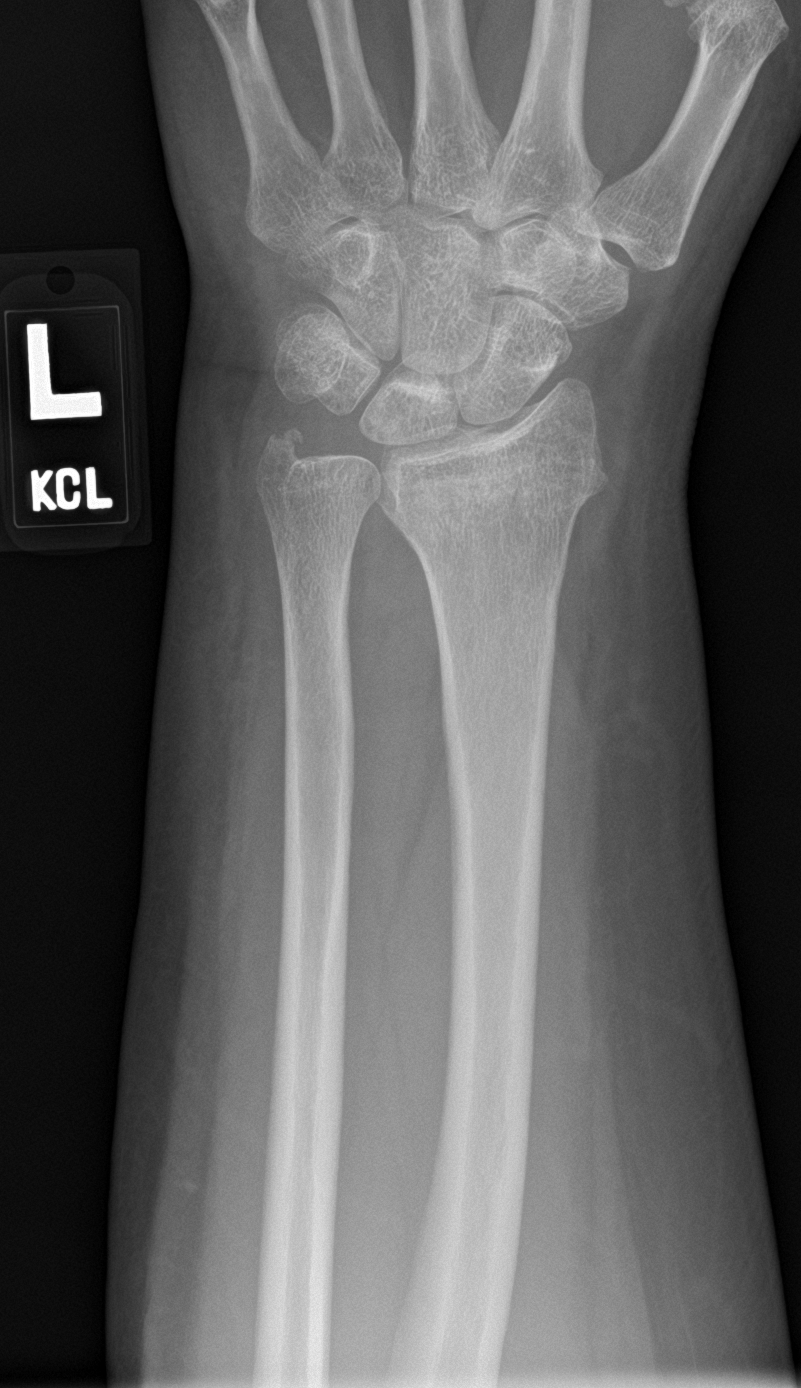

[wrist lat]
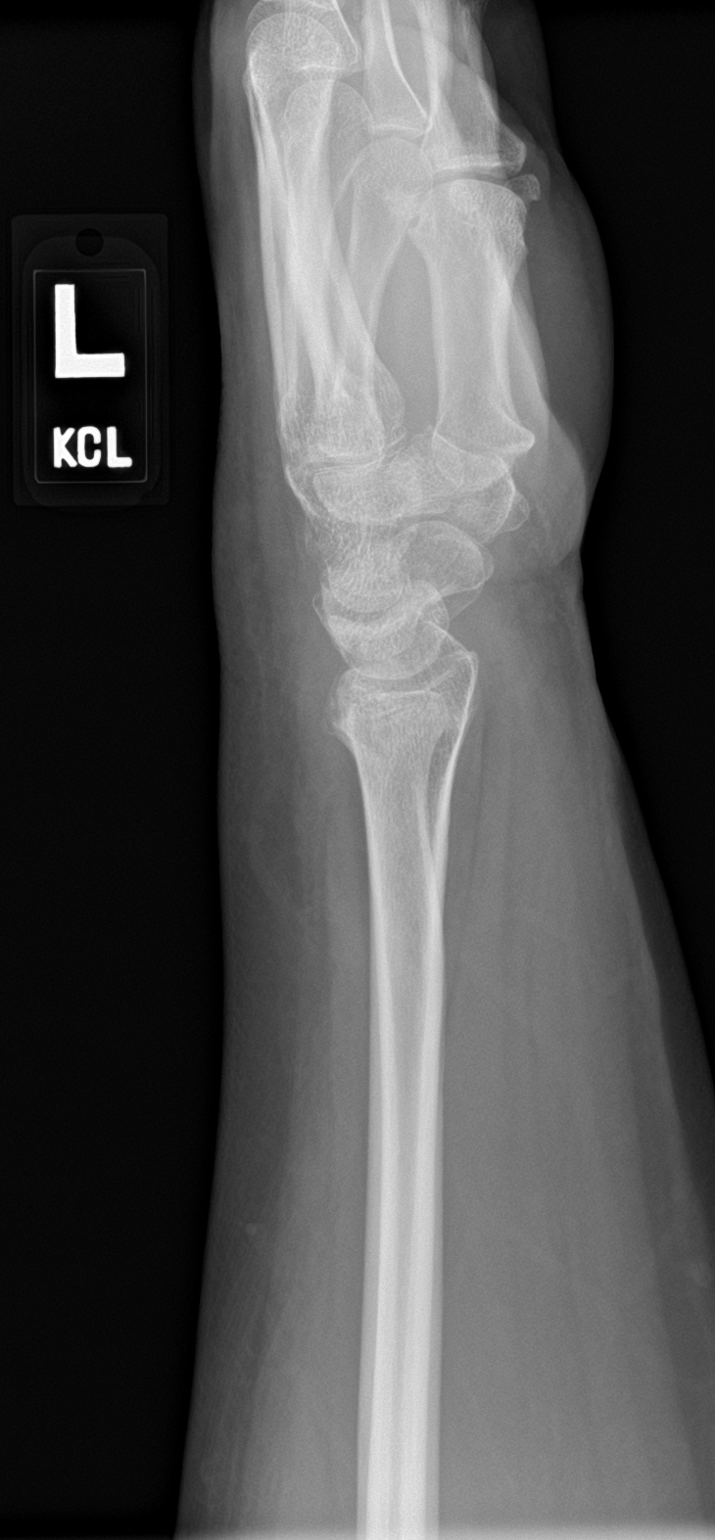

[2 of 2 positions shown; findings below may reference images not displayed]

FINDINGS: Diffuse soft tissue swelling. Again identified is an ulnar styloid
fracture, without significant interval healing. Remote distal radius
fracture is evidenced by sclerosis, similar.
IMPRESSION: No significant healing of previously described ulnar styloid
fracture.

Moderate diffuse soft tissue swelling.

## 2019-04-23 DIAGNOSIS — R69 Illness, unspecified: Secondary | ICD-10-CM | POA: Diagnosis not present

## 2019-04-23 DIAGNOSIS — F411 Generalized anxiety disorder: Secondary | ICD-10-CM | POA: Diagnosis not present

## 2019-05-21 DIAGNOSIS — R69 Illness, unspecified: Secondary | ICD-10-CM | POA: Diagnosis not present

## 2019-05-21 DIAGNOSIS — Z79899 Other long term (current) drug therapy: Secondary | ICD-10-CM | POA: Diagnosis not present

## 2019-05-21 DIAGNOSIS — F411 Generalized anxiety disorder: Secondary | ICD-10-CM | POA: Diagnosis not present

## 2019-06-29 IMAGING — MR MR WRIST*L* W/CM
6 series · 40 of 40 positions shown · IV contrast (agent unspecified)
Comparison: None.

CLINICAL DATA: History of distal radial fracture. Entire wrist
swollen and painful. Weakness and numbness.

EXAM:
MRI OF THE LEFT WRIST WITH CONTRAST (MR Arthrogram)
TECHNIQUE: Multiplanar, multisequence MR imaging of the wrist was performed
immediately following contrast injection into the radiocarpal joint
under fluoroscopic guidance. No intravenous contrast was
administered.

[Series 4: T1 fat-sat · axial · 3.0mm · 0.20mm/px · z∈[-53,+14]mm · 7 of 20 slices shown]
[im 1/20]
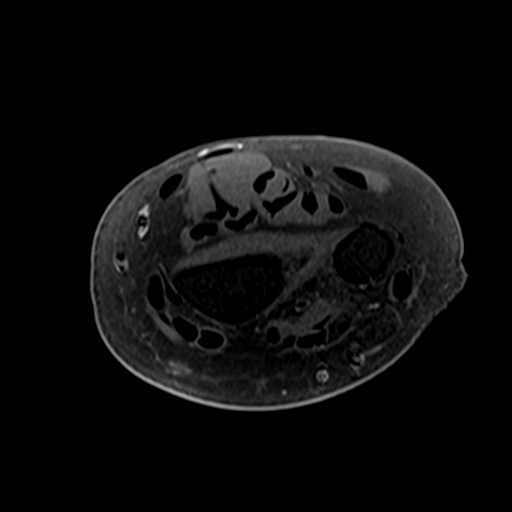
[im 4/20]
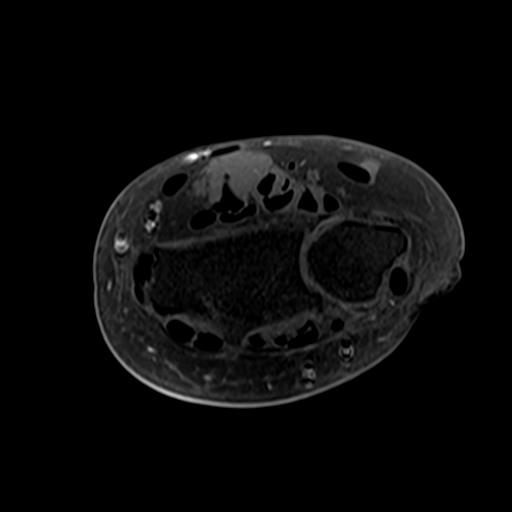
[im 7/20]
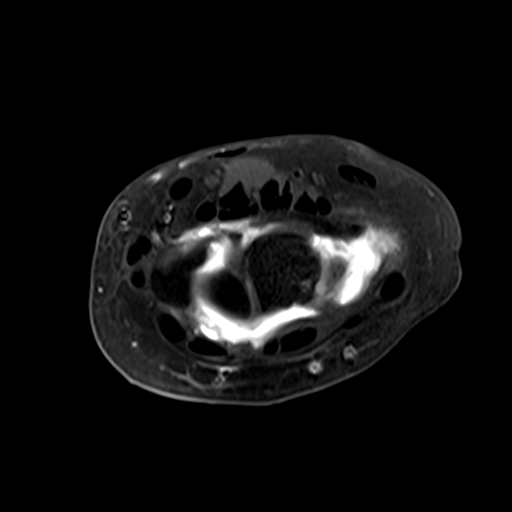
[im 10/20]
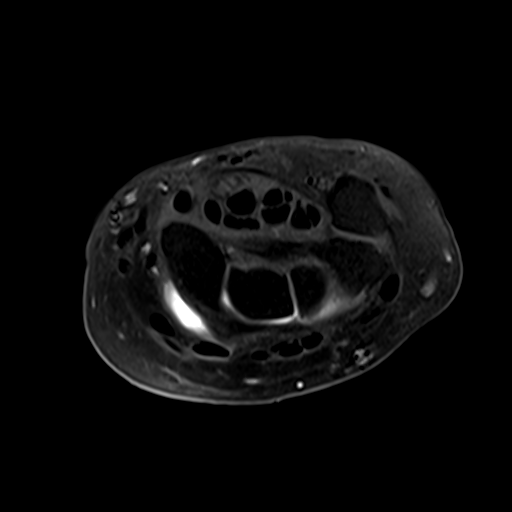
[im 13/20]
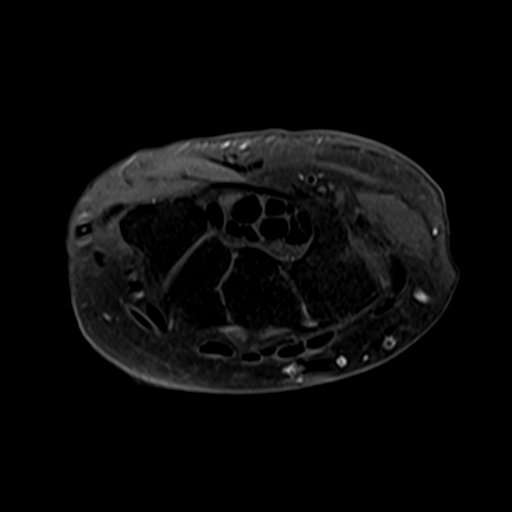
[im 16/20]
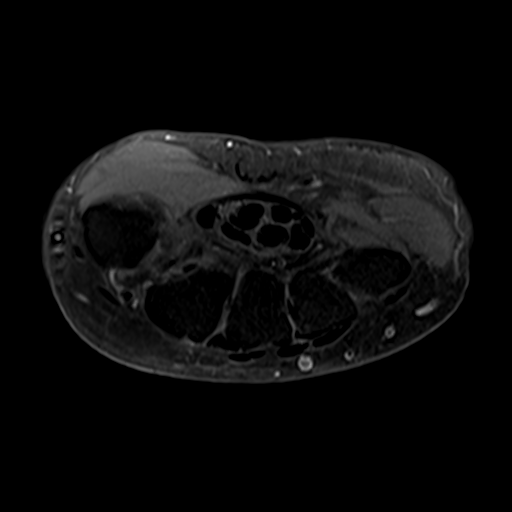
[im 20/20]
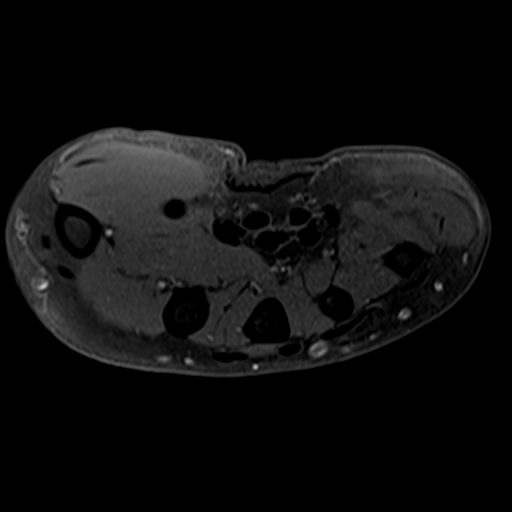

[Series 5: T2 fat-sat · axial · 3.0mm · 0.47mm/px · z∈[-47,+7]mm · 6 of 16 slices shown (1 of 2)]
[im 1/16]
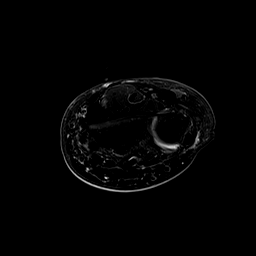
[im 4/16]
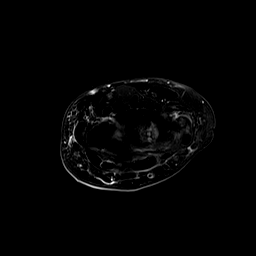
[im 7/16]
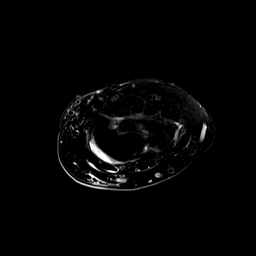
[im 10/16]
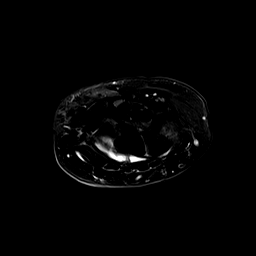
[im 13/16]
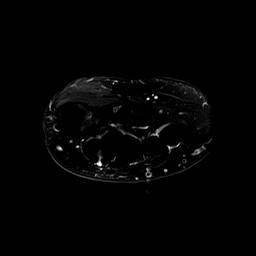
[im 16/16]
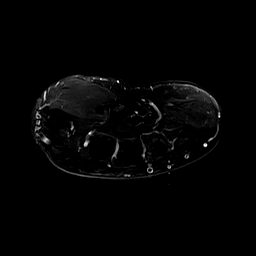

[Series 6: t1_tse_cor_fs · coronal · 3.0mm · 0.39mm/px · 6 of 16 slices shown]
[im 1/16]
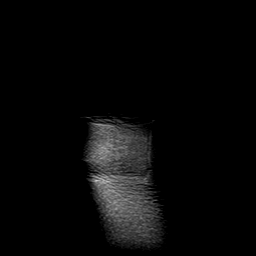
[im 4/16]
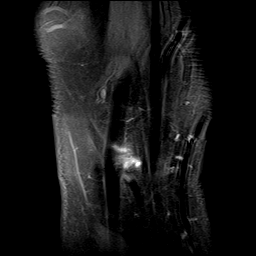
[im 7/16]
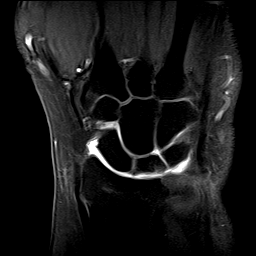
[im 10/16]
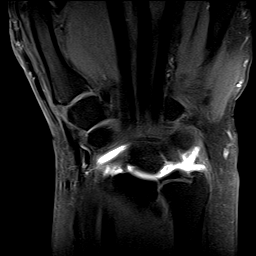
[im 13/16]
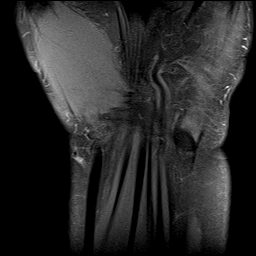
[im 16/16]
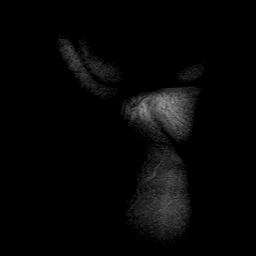

[Series 7: t1_tse_cor_no fs · coronal · 3.0mm · 0.39mm/px · 6 of 16 slices shown]
[im 1/16]
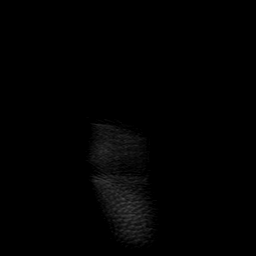
[im 4/16]
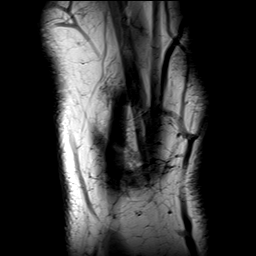
[im 7/16]
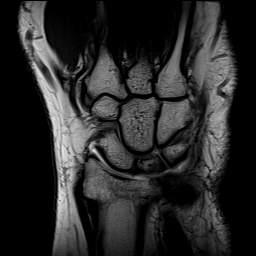
[im 10/16]
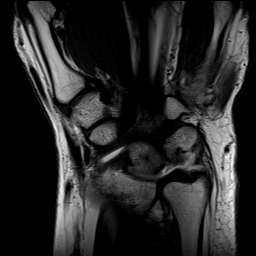
[im 13/16]
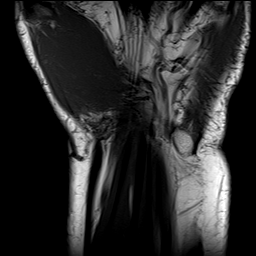
[im 16/16]
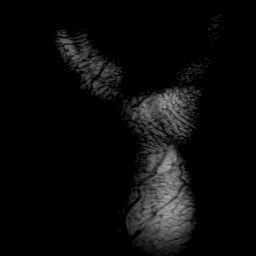

[Series 8: T2 fat-sat · coronal · 3.0mm · 0.39mm/px · 6 of 15 slices shown (2 of 2)]
[im 1/15]
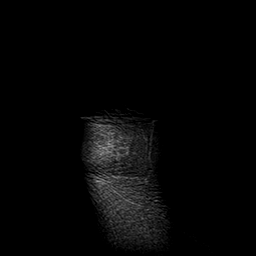
[im 3/15]
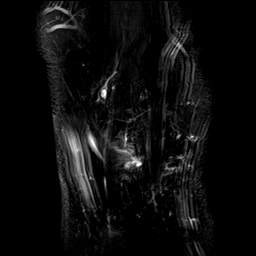
[im 6/15]
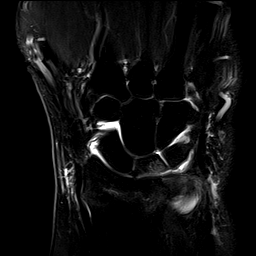
[im 9/15]
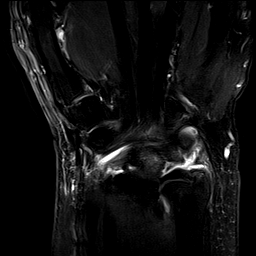
[im 12/15]
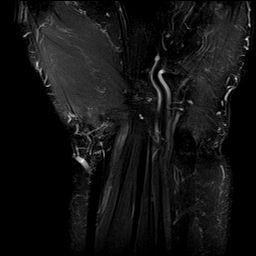
[im 15/15]
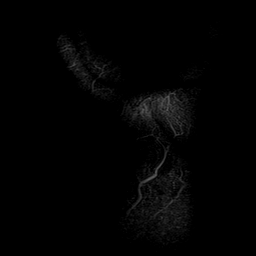

[Series 9: t1_tse_sag_fs · sagittal · 3.0mm · 0.39mm/px · 9 of 24 slices shown]
[im 1/24]
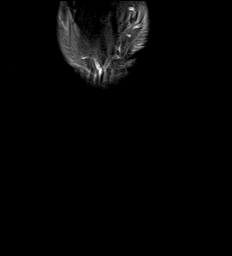
[im 3/24]
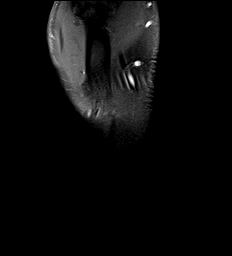
[im 6/24]
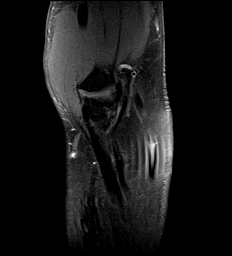
[im 9/24]
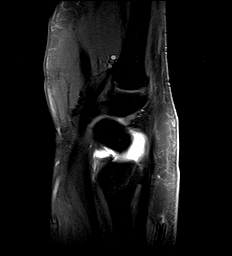
[im 12/24]
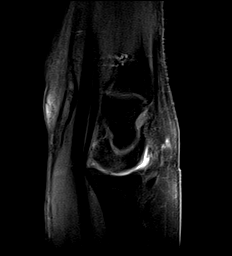
[im 15/24]
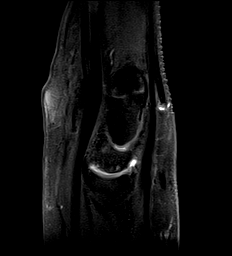
[im 18/24]
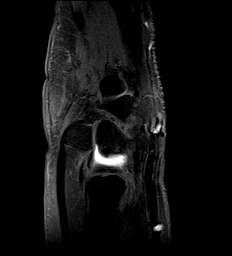
[im 21/24]
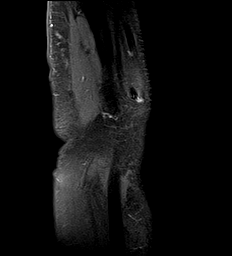
[im 24/24]
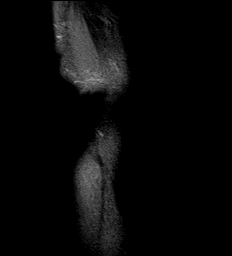

[40 of 40 positions shown; findings below may reference images not displayed]

FINDINGS: Ligaments: Intact scapholunate and lunotriquetral ligaments.

Triangular fibrocartilage: Mild degeneration of the body of the TFCC
without a tear.

Tendons: Flexor compartment tendons are intact. Mild tendinosis of
the extensor carpi ulnaris tendon. Remainder the extensor
compartment tendons are intact.

Carpal tunnel/median nerve: Normal carpal tunnel. Normal median
nerve.

Guyon's canal: Normal.

Joint/cartilage: Mild chondral thinning of the radiocarpal joint.
Partial-thickness cartilage loss with marginal osteophytes of the
first CMC joint. Intraarticular contrast distending the radiocarpal
joint.

Bones/carpal alignment: No acute fracture or dislocation. Marrow
edema and subchondral cystic changes involving the proximal ulnar
aspect of the lunate as can be seen with ulnar abutment syndrome.
Ulnar neutral variance.

Other: No fluid collection or hematoma.  No soft tissue mass.
IMPRESSION: 1. Marrow edema and subchondral cystic changes involving the
proximal ulnar aspect of the lunate as can be seen with ulnar
abutment syndrome. Mild degeneration of the body of the TFCC without
a tear.
2. Mild tendinosis of the extensor carpi ulnaris tendon.

## 2019-07-02 DIAGNOSIS — R69 Illness, unspecified: Secondary | ICD-10-CM | POA: Diagnosis not present

## 2019-07-05 DIAGNOSIS — Z961 Presence of intraocular lens: Secondary | ICD-10-CM | POA: Diagnosis not present

## 2019-07-10 DIAGNOSIS — M19132 Post-traumatic osteoarthritis, left wrist: Secondary | ICD-10-CM | POA: Diagnosis not present

## 2019-07-10 DIAGNOSIS — S63502A Unspecified sprain of left wrist, initial encounter: Secondary | ICD-10-CM | POA: Diagnosis not present

## 2019-07-10 DIAGNOSIS — M7918 Myalgia, other site: Secondary | ICD-10-CM | POA: Diagnosis not present

## 2019-07-25 DIAGNOSIS — R69 Illness, unspecified: Secondary | ICD-10-CM | POA: Diagnosis not present

## 2019-08-01 DIAGNOSIS — R69 Illness, unspecified: Secondary | ICD-10-CM | POA: Diagnosis not present

## 2019-08-08 DIAGNOSIS — R69 Illness, unspecified: Secondary | ICD-10-CM | POA: Diagnosis not present

## 2019-08-13 DIAGNOSIS — Z79899 Other long term (current) drug therapy: Secondary | ICD-10-CM | POA: Diagnosis not present

## 2019-08-13 DIAGNOSIS — F411 Generalized anxiety disorder: Secondary | ICD-10-CM | POA: Diagnosis not present

## 2019-08-13 DIAGNOSIS — R69 Illness, unspecified: Secondary | ICD-10-CM | POA: Diagnosis not present

## 2019-08-15 DIAGNOSIS — R69 Illness, unspecified: Secondary | ICD-10-CM | POA: Diagnosis not present

## 2019-08-21 DIAGNOSIS — R69 Illness, unspecified: Secondary | ICD-10-CM | POA: Diagnosis not present

## 2019-08-23 DIAGNOSIS — R69 Illness, unspecified: Secondary | ICD-10-CM | POA: Diagnosis not present

## 2019-08-29 DIAGNOSIS — R69 Illness, unspecified: Secondary | ICD-10-CM | POA: Diagnosis not present

## 2019-09-02 ENCOUNTER — Other Ambulatory Visit: Payer: Self-pay | Admitting: Internal Medicine

## 2019-09-05 DIAGNOSIS — R69 Illness, unspecified: Secondary | ICD-10-CM | POA: Diagnosis not present

## 2019-09-18 DIAGNOSIS — R69 Illness, unspecified: Secondary | ICD-10-CM | POA: Diagnosis not present

## 2019-09-25 DIAGNOSIS — R69 Illness, unspecified: Secondary | ICD-10-CM | POA: Diagnosis not present

## 2019-09-26 DIAGNOSIS — R69 Illness, unspecified: Secondary | ICD-10-CM | POA: Diagnosis not present

## 2019-09-27 DIAGNOSIS — L814 Other melanin hyperpigmentation: Secondary | ICD-10-CM | POA: Diagnosis not present

## 2019-09-27 DIAGNOSIS — R21 Rash and other nonspecific skin eruption: Secondary | ICD-10-CM | POA: Diagnosis not present

## 2019-09-27 DIAGNOSIS — L280 Lichen simplex chronicus: Secondary | ICD-10-CM | POA: Diagnosis not present

## 2019-09-27 DIAGNOSIS — L304 Erythema intertrigo: Secondary | ICD-10-CM | POA: Diagnosis not present

## 2019-10-16 DIAGNOSIS — R69 Illness, unspecified: Secondary | ICD-10-CM | POA: Diagnosis not present

## 2019-10-30 DIAGNOSIS — R69 Illness, unspecified: Secondary | ICD-10-CM | POA: Diagnosis not present

## 2019-11-12 DIAGNOSIS — Z79899 Other long term (current) drug therapy: Secondary | ICD-10-CM | POA: Diagnosis not present

## 2019-11-12 DIAGNOSIS — R69 Illness, unspecified: Secondary | ICD-10-CM | POA: Diagnosis not present

## 2019-11-12 DIAGNOSIS — F3342 Major depressive disorder, recurrent, in full remission: Secondary | ICD-10-CM | POA: Insufficient documentation

## 2019-12-01 ENCOUNTER — Other Ambulatory Visit: Payer: Self-pay | Admitting: Internal Medicine

## 2019-12-31 ENCOUNTER — Other Ambulatory Visit: Payer: Medicare HMO

## 2019-12-31 DIAGNOSIS — Z20822 Contact with and (suspected) exposure to covid-19: Secondary | ICD-10-CM | POA: Diagnosis not present

## 2020-01-01 LAB — NOVEL CORONAVIRUS, NAA: SARS-CoV-2, NAA: NOT DETECTED

## 2020-01-01 LAB — SARS-COV-2, NAA 2 DAY TAT

## 2020-01-03 ENCOUNTER — Other Ambulatory Visit: Payer: Self-pay | Admitting: Internal Medicine

## 2020-01-13 ENCOUNTER — Encounter: Payer: Self-pay | Admitting: Obstetrics and Gynecology

## 2020-01-13 ENCOUNTER — Ambulatory Visit (INDEPENDENT_AMBULATORY_CARE_PROVIDER_SITE_OTHER): Payer: Medicare HMO | Admitting: Obstetrics and Gynecology

## 2020-01-13 ENCOUNTER — Other Ambulatory Visit: Payer: Self-pay

## 2020-01-13 VITALS — BP 130/78 | Ht 64.0 in | Wt 186.0 lb

## 2020-01-13 DIAGNOSIS — Z1329 Encounter for screening for other suspected endocrine disorder: Secondary | ICD-10-CM | POA: Diagnosis not present

## 2020-01-13 DIAGNOSIS — R591 Generalized enlarged lymph nodes: Secondary | ICD-10-CM

## 2020-01-13 DIAGNOSIS — N907 Vulvar cyst: Secondary | ICD-10-CM

## 2020-01-13 DIAGNOSIS — Z01419 Encounter for gynecological examination (general) (routine) without abnormal findings: Secondary | ICD-10-CM

## 2020-01-13 DIAGNOSIS — Z23 Encounter for immunization: Secondary | ICD-10-CM | POA: Diagnosis not present

## 2020-01-13 DIAGNOSIS — N951 Menopausal and female climacteric states: Secondary | ICD-10-CM

## 2020-01-13 DIAGNOSIS — Z833 Family history of diabetes mellitus: Secondary | ICD-10-CM | POA: Diagnosis not present

## 2020-01-13 DIAGNOSIS — M8580 Other specified disorders of bone density and structure, unspecified site: Secondary | ICD-10-CM | POA: Diagnosis not present

## 2020-01-13 DIAGNOSIS — Z13220 Encounter for screening for lipoid disorders: Secondary | ICD-10-CM

## 2020-01-13 NOTE — Progress Notes (Signed)
Crystal Duarte [birth year] [id number]  SUBJECTIVE:  59 y.o. G2P2 female for annual routine gynecologic exam Having return of night sweats, not really having hot flashes during the day, however. Concerned about some bumps in her right triceps/bicep area.  Also having more irritation of the vulvar sebaceous cysts noted on her previous exams.   Current Outpatient Medications  Medication Sig Dispense Refill  . ANORO ELLIPTA 62.5-25 MCG/INH AEPB INHALE ONE DOSE BY MOUTH DAILY 60 each 2  . busPIRone (BUSPAR) 10 MG tablet 20 mg 3 (three) times daily.     Marland Kitchen gabapentin (NEURONTIN) 400 MG capsule Take 1 capsule (400 mg total) by mouth 3 (three) times daily. (Patient taking differently: Take 400 mg by mouth 2 (two) times daily. ) 90 capsule 0  . gabapentin (NEURONTIN) 600 MG tablet     . Multiple Vitamin (MULTI-VITAMINS) TABS Take by mouth.    . nystatin-triamcinolone ointment (MYCOLOG) Apply 1 application topically 2 (two) times daily. 100 g 0  . propranolol (INDERAL) 20 MG tablet Take 1 tablet (20 mg total) by mouth 3 (three) times daily. (Patient taking differently: Take 10 mg by mouth 3 (three) times daily. ) 90 tablet 0  . traZODone (DESYREL) 100 MG tablet Take 2 tablets (200 mg total) by mouth at bedtime as needed for sleep. (Patient taking differently: Take 250 mg by mouth at bedtime as needed for sleep. ) 60 tablet 0  . venlafaxine XR (EFFEXOR-XR) 150 MG 24 hr capsule Take 150 mg by mouth daily with breakfast.     No current facility-administered medications for this visit.   Allergies: Other, Latuda [lurasidone hcl], and Sulfa antibiotics  No LMP recorded. Patient has had a hysterectomy.  Past medical history,surgical history, problem list, medications, allergies, family history and social history were all reviewed and documented as reviewed in the EPIC chart.  ROS:  Feeling well. No dyspnea or chest pain on exertion.  No abdominal pain, change in bowel habits, black or bloody stools.   No urinary tract symptoms. GYN ROS: no abnormal bleeding, pelvic pain or discharge, no breast pain or new or enlarging lumps on self exam.No neurological complaints.   OBJECTIVE:  BP 130/78   Ht 5\' 4"  (1.626 m)   Wt 186 lb (84.4 kg)   BMI 31.93 kg/m  The patient appears well, alert, oriented, in no distress. ENT normal.  Neck supple. No cervical or supraclavicular adenopathy or thyromegaly.  Palpable lymphadenopathy versus subcutaneous cysts in medial right upper arm. Lungs are clear, good air entry, no wheezes, rhonchi or rales. S1 and S2 normal, no murmurs, regular rate and rhythm.  Abdomen soft without tenderness, guarding, mass or organomegaly.  Neurological is normal, no focal findings.  BREAST EXAM: breasts appear normal, no suspicious masses, no skin or nipple changes or axillary nodes  PELVIC EXAM: VULVA: normal appearing vulva with atrophic change, scattered sebaceous cyst along the bilateral labia majora of different sizes all less than 5 mm in size, no other masses, tenderness or lesions, VAGINA: normal appearing vagina with trophic change, normal color and discharge, no lesions, CERVIX: surgically absent, UTERUS: surgically absent, vaginal cuff normal, ADNEXA: no masses, nontender  Chaperone: [name] present during the examination  ASSESSMENT:  59 y.o. G2P2 here for annual gynecologic exam  PLAN:   1. Postmenopausal.  Prior hysterectomy and BSO.  Had minimal hot flash symptoms since initially recovering from surgical menopause, but now has had a resurgence of night sweats only.  Recommended trying OTC products such as  black cohosh and soy.  We discussed potential other causes and she identifies no changes in her medication regimen, although some of her medications can cause the side effect of vasomotor symptoms but she has not had any doses changed around or any other modifications.  We will check labs as below to evaluate for potential other causes. 2.  History VAIN 1.   Pap smear 2019 normal.  No Pap smear today, would recommend next Pap smear in 2022 following the 3-year interval.  History high risk HPV positive, negative for subtype 16, 18, 45 in 2015. 3. Mammogram 03/2019.  Normal breast exam today.  She is reminded to schedule an annual mammogram this year when due. 4.  Sebaceous cyst of the bilateral labia majora.  She is having a little bit more symptoms now.  I discussed we could try excising but given the extent I would recommend doing this in the operating room, which might also be fraught with more problems with healing from the procedure than what her current symptoms entail.  She is welcome to also just try warm compresses and sitz bath's, but she says that nothing is really helping symptoms in the area so I will leave to her if she wants to pursue more aggressive treatment. 5. Right upper arm lymphadenopathy vs subcutaneous cysts.  Recommended that she keep an eye on this and let her primary care doctor know if those bumps are enlarging.  Indicates she has not had any other evidence of lymphadenopathy elsewhere in the body.  No axillary lymphadenopathy so I do not think this is representative of a problem from the breast area.   6. Colonoscopy 2016.  Recommended that she follow up at the recommended interval by her GI specialist. 7. Osteopenia.  DEXA 2020.  T score -2.1.  FRAX 9.6% / 2.1%.  BMD stable.  Next DEXA recommended 2022 at the 2-year interval.  8. Health maintenance.  She will return another day for fasting labs including CMP, CBC, lipid panel.  We will also check hemoglobin A1c for family history of diabetes mellitus.  Also checking FSH, estradiol level, and a TSH due to the night sweats.  Additional time was spent in today's encounter discussing other items and health concerns above and beyond the routine gynecologic exam.  Return annually or sooner, prn.  Joseph Pierini MD 01/13/20

## 2020-01-27 ENCOUNTER — Other Ambulatory Visit: Payer: Medicare HMO

## 2020-01-27 ENCOUNTER — Other Ambulatory Visit: Payer: Self-pay

## 2020-01-27 DIAGNOSIS — Z13220 Encounter for screening for lipoid disorders: Secondary | ICD-10-CM

## 2020-01-27 DIAGNOSIS — E559 Vitamin D deficiency, unspecified: Secondary | ICD-10-CM | POA: Diagnosis not present

## 2020-01-27 DIAGNOSIS — M8580 Other specified disorders of bone density and structure, unspecified site: Secondary | ICD-10-CM | POA: Diagnosis not present

## 2020-01-27 DIAGNOSIS — N951 Menopausal and female climacteric states: Secondary | ICD-10-CM | POA: Diagnosis not present

## 2020-01-27 DIAGNOSIS — Z1329 Encounter for screening for other suspected endocrine disorder: Secondary | ICD-10-CM | POA: Diagnosis not present

## 2020-01-27 DIAGNOSIS — Z833 Family history of diabetes mellitus: Secondary | ICD-10-CM

## 2020-01-27 DIAGNOSIS — Z01419 Encounter for gynecological examination (general) (routine) without abnormal findings: Secondary | ICD-10-CM

## 2020-01-27 DIAGNOSIS — E781 Pure hyperglyceridemia: Secondary | ICD-10-CM | POA: Diagnosis not present

## 2020-01-28 LAB — COMPREHENSIVE METABOLIC PANEL
AG Ratio: 1.7 (calc) (ref 1.0–2.5)
ALT: 19 U/L (ref 6–29)
AST: 22 U/L (ref 10–35)
Albumin: 4 g/dL (ref 3.6–5.1)
Alkaline phosphatase (APISO): 93 U/L (ref 37–153)
BUN: 9 mg/dL (ref 7–25)
CO2: 25 mmol/L (ref 20–32)
Calcium: 9.4 mg/dL (ref 8.6–10.4)
Chloride: 106 mmol/L (ref 98–110)
Creat: 0.83 mg/dL (ref 0.50–0.99)
Globulin: 2.4 g/dL (calc) (ref 1.9–3.7)
Glucose, Bld: 104 mg/dL — ABNORMAL HIGH (ref 65–99)
Potassium: 4.6 mmol/L (ref 3.5–5.3)
Sodium: 140 mmol/L (ref 135–146)
Total Bilirubin: 0.7 mg/dL (ref 0.2–1.2)
Total Protein: 6.4 g/dL (ref 6.1–8.1)

## 2020-01-28 LAB — HEMOGLOBIN A1C
Hgb A1c MFr Bld: 5.7 % of total Hgb — ABNORMAL HIGH (ref <5.7)
Mean Plasma Glucose: 117 (calc)
eAG (mmol/L): 6.5 (calc)

## 2020-01-28 LAB — CBC
HCT: 42.3 % (ref 35.0–45.0)
Hemoglobin: 13.8 g/dL (ref 11.7–15.5)
MCH: 31.6 pg (ref 27.0–33.0)
MCHC: 32.6 g/dL (ref 32.0–36.0)
MCV: 96.8 fL (ref 80.0–100.0)
MPV: 10.7 fL (ref 7.5–12.5)
Platelets: 254 10*3/uL (ref 140–400)
RBC: 4.37 10*6/uL (ref 3.80–5.10)
RDW: 12.9 % (ref 11.0–15.0)
WBC: 11.3 10*3/uL — ABNORMAL HIGH (ref 3.8–10.8)

## 2020-01-28 LAB — LIPID PANEL
Cholesterol: 192 mg/dL (ref <200)
HDL: 57 mg/dL (ref >=50)
LDL Cholesterol (Calc): 89 mg/dL (calc)
Non-HDL Cholesterol (Calc): 135 mg/dL (calc) — ABNORMAL HIGH (ref <130)
Total CHOL/HDL Ratio: 3.4 (calc) (ref <5.0)
Triglycerides: 345 mg/dL — ABNORMAL HIGH (ref <150)

## 2020-01-28 LAB — TSH: TSH: 3.15 mIU/L (ref 0.40–4.50)

## 2020-01-28 LAB — VITAMIN D 25 HYDROXY (VIT D DEFICIENCY, FRACTURES): Vit D, 25-Hydroxy: 27 ng/mL — ABNORMAL LOW (ref 30–100)

## 2020-01-28 LAB — FOLLICLE STIMULATING HORMONE: FSH: 84 m[IU]/mL

## 2020-01-28 LAB — ESTRADIOL: Estradiol: <15 pg/mL

## 2020-01-28 NOTE — Progress Notes (Signed)
No that is okay if she read it, thank you

## 2020-01-28 NOTE — Progress Notes (Signed)
Not sure if this patient uses her my chart, but I wanted to inform her of what I put in my message.

## 2020-02-10 ENCOUNTER — Other Ambulatory Visit: Payer: Self-pay

## 2020-02-10 ENCOUNTER — Ambulatory Visit (INDEPENDENT_AMBULATORY_CARE_PROVIDER_SITE_OTHER): Payer: Medicare HMO | Admitting: Family

## 2020-02-10 ENCOUNTER — Encounter: Payer: Self-pay | Admitting: Family

## 2020-02-10 VITALS — BP 138/64 | HR 82 | Temp 98.3°F | Ht 64.0 in | Wt 187.8 lb

## 2020-02-10 DIAGNOSIS — E785 Hyperlipidemia, unspecified: Secondary | ICD-10-CM

## 2020-02-10 DIAGNOSIS — M79621 Pain in right upper arm: Secondary | ICD-10-CM

## 2020-02-10 DIAGNOSIS — R0602 Shortness of breath: Secondary | ICD-10-CM

## 2020-02-10 DIAGNOSIS — Z720 Tobacco use: Secondary | ICD-10-CM

## 2020-02-10 DIAGNOSIS — Z8249 Family history of ischemic heart disease and other diseases of the circulatory system: Secondary | ICD-10-CM | POA: Diagnosis not present

## 2020-02-10 DIAGNOSIS — R7303 Prediabetes: Secondary | ICD-10-CM | POA: Diagnosis not present

## 2020-02-10 MED ORDER — ROSUVASTATIN CALCIUM 10 MG PO TABS: 10.0000 mg | ORAL_TABLET | Freq: Every day | ORAL | 0 refills | Status: DC

## 2020-02-10 MED ORDER — TRELEGY ELLIPTA 100-62.5-25 MCG/INH IN AEPB: INHALATION_SPRAY | RESPIRATORY_TRACT | 1 refills | Status: DC

## 2020-02-10 NOTE — Progress Notes (Signed)
Crystal Duarte is a 59 y.o. female with the following history as recorded in EpicCare:  Patient Active Problem List   Diagnosis Date Noted  . Severe episode of recurrent major depressive disorder, without psychotic features ([place]) 07/18/2017  . COPD (chronic obstructive pulmonary disease) ([place]) 12/26/2016  . Routine general medical examination at a health care facility 10/09/2015  . Memory loss 10/04/2014  . Affective bipolar disorder ([place]) 03/12/2014  . Peripheral vascular disease, unspecified ([place]) 10/10/2013  . Smoker 08/28/2012  . Multiple pulmonary nodules 11/12/2010    Current Outpatient Medications  Medication Sig Dispense Refill  . ANORO ELLIPTA 62.5-25 MCG/INH AEPB INHALE ONE DOSE BY MOUTH DAILY 60 each 2  . busPIRone (BUSPAR) 10 MG tablet 20 mg 3 (three) times daily.     Marland Kitchen gabapentin (NEURONTIN) 400 MG capsule Take 1 capsule (400 mg total) by mouth 3 (three) times daily. (Patient taking differently: Take 400 mg by mouth 2 (two) times daily. ) 90 capsule 0  . gabapentin (NEURONTIN) 600 MG tablet     . Multiple Vitamin (MULTI-VITAMINS) TABS Take by mouth.    . propranolol (INDERAL) 20 MG tablet Take 1 tablet (20 mg total) by mouth 3 (three) times daily. (Patient taking differently: Take 10 mg by mouth 3 (three) times daily. ) 90 tablet 0  . traZODone (DESYREL) 100 MG tablet Take 2 tablets (200 mg total) by mouth at bedtime as needed for sleep. (Patient taking differently: Take 250 mg by mouth at bedtime as needed for sleep. ) 60 tablet 0  . venlafaxine XR (EFFEXOR-XR) 150 MG 24 hr capsule Take 187.5 mg by mouth daily with breakfast.     . Fluticasone-Umeclidin-Vilant (TRELEGY ELLIPTA) 100-62.5-25 MCG/INH AEPB Use daily as directed 60 each 1  . nystatin-triamcinolone ointment (MYCOLOG) Apply 1 application topically 2 (two) times daily. (Patient not taking: Reported on 02/10/2020) 100 g 0  . rosuvastatin (CRESTOR) 10 MG tablet Take 1 tablet (10 mg total) by mouth daily. 90 tablet 0    No current facility-administered medications for this visit.    Allergies: Other, Latuda [lurasidone hcl], and Sulfa antibiotics  Past Medical History:  Diagnosis Date  . Coccidioidomycosis, pulmonary ([place])   . Depression   . Hx of adenomatous polyp of colon 12/04/2014  . Major depressive disorder    since 14, electroconvulsive therapy, and transcranial magnetic stimulation recently for 6 weeks every day   . Osteopenia 01/2019   T score -2.1 FRAX 9.6% / 2.1% stable from prior DEXA  . Panic attack   . PONV (postoperative nausea and vomiting)   . VAIN I (vaginal intraepithelial neoplasia grade I) 2015   Positive HPV negative subtype 16, 18/45    Past Surgical History:  Procedure Laterality Date  . ABDOMINAL HYSTERECTOMY     TAH BSO  . BILATERAL VATS ABLATION     vats for biopsy due to infection  . BREAST EXCISIONAL BIOPSY Right    benign  . COLONOSCOPY  2001   hemorrhoidectomy  . LUNG SURGERY  2001   VATS surgery   . OVARIAN CYST REMOVAL  1970's  . ULNAR NERVE TRANSPOSITION Left 12/06/2018   Procedure: LEFT ULNAR NERVE DECOMPRESSION,  ;  Surgeon: Leanora Cover, MD;  Location: [facility];  Service: Orthopedics;  Laterality: Left;  Marland Kitchen VESICOVAGINAL FISTULA CLOSURE W/ TAH  2005  . WRIST ARTHROSCOPY Left 03/13/2018   Procedure: ARTHROSCOPY LEFT WRIST WITH DEBRIDEMENT;  Surgeon: Leanora Cover, MD;  Location: [facility];  Service: Orthopedics;  Laterality: Left;  block    Family History  Problem Relation Age of Onset  . Asthma Mother   . Ovarian cancer Mother   . Cancer Mother        OVARIAN  . Varicose Veins Mother   . Heart disease Father   . Peripheral vascular disease Father   . Cancer Maternal Grandmother        LUNG- LUNG  . Colon cancer Neg Hx     Social History   Tobacco Use  . Smoking status: Current Some Day Smoker    Packs/day: 0.25    Years: 42.00    Pack years: 10.50    Types: Cigarettes    Last attempt to quit: 09/25/2016     Years since quitting: 3.3  . Smokeless tobacco: Former Systems developer    Quit date: 06/13/2016  Substance Use Topics  . Alcohol use: No    Alcohol/week: 0.0 standard drinks    Subjective:   Patient recently had labs done with her GYN as part of her CPE; was noted to be pre-diabetic and elevated TGs;  + smoker; +FH of CAD; admits that she is not eating like she is supposed to be eating;   The 10-year ASCVD risk score Mikey Bussing DC Brooke Bonito., et al., 2013) is: 7.5%   Values used to calculate the score:     Age: 33 years     Sex: Female     Is Non-Hispanic African American: No     Diabetic: No     Tobacco smoker: Yes     Systolic Blood Pressure: 086 mmHg     Is BP treated: No     HDL Cholesterol: 57 mg/dL     Total Cholesterol: 192 mg/dL   Objective:  Vitals:   02/10/20 1346  BP: 138/64  Pulse: 82  Temp: 98.3 F (36.8 C)  TempSrc: Oral  SpO2: 99%  Weight: 187 lb 12.8 oz (85.2 kg)  Height: 5\' 4"  (1.626 m)    General: Well developed, well nourished, in no acute distress  Skin : Warm and dry.  Head: Normocephalic and atraumatic  Eyes: Sclera and conjunctiva clear; pupils round and reactive to light; extraocular movements intact  Ears: External normal; canals clear; tympanic membranes normal  Oropharynx: Pink, supple. No suspicious lesions  Neck: Supple without thyromegaly, adenopathy  Lungs: Respirations unlabored; clear to auscultation bilaterally without wheeze, rales, rhonchi  CVS exam: normal rate and regular rhythm.  Abdomen: Soft; nontender; nondistended; normoactive bowel sounds; no masses or hepatosplenomegaly  Musculoskeletal: No deformities; no active joint inflammation  Extremities: No edema, cyanosis, clubbing  Vessels: Symmetric bilaterally  Neurologic: Alert and oriented; speech intact; face symmetrical; moves all extremities well; CNII-XII intact without focal deficit  Assessment:  1. Tobacco abuse   2. SOB (shortness of breath)   3. FH: CAD (coronary artery disease)   4.  Hyperlipidemia, unspecified hyperlipidemia type   5. Axillary pain, right   6. Pre-diabetes     Plan:  1. Needs to quit smoking; refer for lung cancer screening; 2. Change Anoro to Trelegy; may need to refer to pulmonology; 3. Refer to cardiology for possible stress test/ management; 4. Start Crestor 10 mg daily; refer to nutrition; 5. Schedule for diagnostic mammogram; follow-up to be determined; 6. Work on diet/ exercise quit smoking;   This visit occurred during the SARS-CoV-2 public health emergency.  Safety protocols were in place, including screening questions prior to the visit, additional usage of staff PPE, and extensive cleaning of exam  room while observing appropriate contact time as indicated for disinfecting solutions.     No follow-ups on file.  Orders Placed This Encounter  Procedures  . MM Digital Diagnostic Bilat    Standing Status:   Future    Standing Expiration Date:   02/09/2021    Order Specific Question:   Reason for Exam (SYMPTOM  OR DIAGNOSIS REQUIRED)    Answer:   right axillary pain    Order Specific Question:   Is the patient pregnant?    Answer:   No    Order Specific Question:   Preferred imaging location?    Answer:   [facility]  . Ambulatory Referral for Lung Cancer Scre    Referral Priority:   Routine    Referral Type:   Consultation    Referral Reason:   Specialty Services Required    Number of Visits Requested:   1  . Ambulatory referral to Cardiology    Referral Priority:   Routine    Referral Type:   Consultation    Referral Reason:   Specialty Services Required    Requested Specialty:   Cardiology    Number of Visits Requested:   1  . Amb ref to Medical Nutrition Therapy-MNT    Referral Priority:   Routine    Referral Type:   Consultation    Referral Reason:   Specialty Services Required    Requested Specialty:   Nutrition    Number of Visits Requested:   1    Requested Prescriptions   Signed Prescriptions Disp Refills  .  rosuvastatin (CRESTOR) 10 MG tablet 90 tablet 0    Sig: Take 1 tablet (10 mg total) by mouth daily.  . Fluticasone-Umeclidin-Vilant (TRELEGY ELLIPTA) 100-62.5-25 MCG/INH AEPB 60 each 1    Sig: Use daily as directed

## 2020-02-11 ENCOUNTER — Telehealth: Payer: Self-pay | Admitting: Internal Medicine

## 2020-02-11 DIAGNOSIS — R197 Diarrhea, unspecified: Secondary | ICD-10-CM

## 2020-02-11 NOTE — Telephone Encounter (Signed)
Patient was seen by Jodi Mourning yesterday and she wanted to let her know about a few more concerns she was having she forgot to mention and get someone to give her a call back on what to do about them.  She has diarrhea every morning for the past couple months so she is concerned about that. Rash on her back she previously had medication prescribed for and it didn't work so she wanted to know if she could get something different.  Patient [phone]

## 2020-02-12 ENCOUNTER — Telehealth: Payer: Self-pay

## 2020-02-12 NOTE — Telephone Encounter (Signed)
With the diarrhea, she would need to see GI; referral done.  As far as a rash, she would need to be seen;

## 2020-02-12 NOTE — Telephone Encounter (Signed)
Information given to patient and apt has been scheduled for 11/15 at 1:20

## 2020-02-17 ENCOUNTER — Other Ambulatory Visit: Payer: Self-pay

## 2020-02-17 ENCOUNTER — Ambulatory Visit (INDEPENDENT_AMBULATORY_CARE_PROVIDER_SITE_OTHER): Payer: Medicare HMO | Admitting: Family

## 2020-02-17 ENCOUNTER — Encounter: Payer: Self-pay | Admitting: Family

## 2020-02-17 VITALS — BP 128/80 | HR 74 | Temp 98.3°F | Ht 64.0 in | Wt 186.3 lb

## 2020-02-17 DIAGNOSIS — R21 Rash and other nonspecific skin eruption: Secondary | ICD-10-CM | POA: Diagnosis not present

## 2020-02-17 DIAGNOSIS — R197 Diarrhea, unspecified: Secondary | ICD-10-CM | POA: Diagnosis not present

## 2020-02-17 LAB — AMYLASE: Amylase: 30 U/L (ref 27–131)

## 2020-02-17 LAB — LIPASE: Lipase: 16 U/L (ref 11.0–59.0)

## 2020-02-17 MED ORDER — PREDNISONE 20 MG PO TABS: 20.0000 mg | ORAL_TABLET | Freq: Every day | ORAL | 0 refills | Status: DC

## 2020-02-17 NOTE — Progress Notes (Signed)
Crystal Duarte is a 59 y.o. female with the following history as recorded in EpicCare:  Patient Active Problem List   Diagnosis Date Noted  . Severe episode of recurrent major depressive disorder, without psychotic features ([place]) 07/18/2017  . COPD (chronic obstructive pulmonary disease) ([place]) 12/26/2016  . Routine general medical examination at a health care facility 10/09/2015  . Memory loss 10/04/2014  . Affective bipolar disorder ([place]) 03/12/2014  . Peripheral vascular disease, unspecified ([place]) 10/10/2013  . Smoker 08/28/2012  . Multiple pulmonary nodules 11/12/2010    Current Outpatient Medications  Medication Sig Dispense Refill  . ANORO ELLIPTA 62.5-25 MCG/INH AEPB INHALE ONE DOSE BY MOUTH DAILY 60 each 2  . busPIRone (BUSPAR) 10 MG tablet 20 mg 3 (three) times daily.     . Fluticasone-Umeclidin-Vilant (TRELEGY ELLIPTA) 100-62.5-25 MCG/INH AEPB Use daily as directed 60 each 1  . gabapentin (NEURONTIN) 400 MG capsule Take 1 capsule (400 mg total) by mouth 3 (three) times daily. (Patient taking differently: Take 400 mg by mouth 2 (two) times daily. ) 90 capsule 0  . gabapentin (NEURONTIN) 600 MG tablet     . Multiple Vitamin (MULTI-VITAMINS) TABS Take by mouth.    . nystatin-triamcinolone ointment (MYCOLOG) Apply 1 application topically 2 (two) times daily. 100 g 0  . propranolol (INDERAL) 20 MG tablet Take 1 tablet (20 mg total) by mouth 3 (three) times daily. (Patient taking differently: Take 10 mg by mouth 3 (three) times daily. ) 90 tablet 0  . rosuvastatin (CRESTOR) 10 MG tablet Take 1 tablet (10 mg total) by mouth daily. 90 tablet 0  . traZODone (DESYREL) 100 MG tablet Take 2 tablets (200 mg total) by mouth at bedtime as needed for sleep. (Patient taking differently: Take 250 mg by mouth at bedtime as needed for sleep. ) 60 tablet 0  . venlafaxine XR (EFFEXOR-XR) 150 MG 24 hr capsule Take 187.5 mg by mouth daily with breakfast.     . predniSONE (DELTASONE) 20 MG tablet  Take 1 tablet (20 mg total) by mouth daily with breakfast. 7 tablet 0   No current facility-administered medications for this visit.    Allergies: Other, Latuda [lurasidone hcl], and Sulfa antibiotics  Past Medical History:  Diagnosis Date  . Coccidioidomycosis, pulmonary ([place])   . Depression   . Hx of adenomatous polyp of colon 12/04/2014  . Major depressive disorder    since 14, electroconvulsive therapy, and transcranial magnetic stimulation recently for 6 weeks every day   . Osteopenia 01/2019   T score -2.1 FRAX 9.6% / 2.1% stable from prior DEXA  . Panic attack   . PONV (postoperative nausea and vomiting)   . VAIN I (vaginal intraepithelial neoplasia grade I) 2015   Positive HPV negative subtype 16, 18/45    Past Surgical History:  Procedure Laterality Date  . ABDOMINAL HYSTERECTOMY     TAH BSO  . BILATERAL VATS ABLATION     vats for biopsy due to infection  . BREAST EXCISIONAL BIOPSY Right    benign  . COLONOSCOPY  2001   hemorrhoidectomy  . LUNG SURGERY  2001   VATS surgery   . OVARIAN CYST REMOVAL  1970's  . ULNAR NERVE TRANSPOSITION Left 12/06/2018   Procedure: LEFT ULNAR NERVE DECOMPRESSION,  ;  Surgeon: Leanora Cover, MD;  Location: [facility];  Service: Orthopedics;  Laterality: Left;  Marland Kitchen VESICOVAGINAL FISTULA CLOSURE W/ TAH  2005  . WRIST ARTHROSCOPY Left 03/13/2018   Procedure: ARTHROSCOPY LEFT WRIST WITH  DEBRIDEMENT;  Surgeon: Leanora Cover, MD;  Location: [facility];  Service: Orthopedics;  Laterality: Left;  block    Family History  Problem Relation Age of Onset  . Asthma Mother   . Ovarian cancer Mother   . Cancer Mother        OVARIAN  . Varicose Veins Mother   . Heart disease Father   . Peripheral vascular disease Father   . Cancer Maternal Grandmother        LUNG- LUNG  . Colon cancer Neg Hx     Social History   Tobacco Use  . Smoking status: Current Some Day Smoker    Packs/day: 0.25    Years: 42.00    Pack years:  10.50    Types: Cigarettes    Last attempt to quit: 09/25/2016    Years since quitting: 3.3  . Smokeless tobacco: Former Systems developer    Quit date: 06/13/2016  Substance Use Topics  . Alcohol use: No    Alcohol/week: 0.0 standard drinks    Subjective:   Patient is concerned about recurrent rash on her back; has seen dermatology and biopsy was done; lichen dermatitis was result; patient was given topical steroid- unsure of name of medication but did not find beneficial and hard to apply to affected area; has not seen her dermatologist in follow up about persisting symptoms;  Also mentions chronic diarrhea; has been referred to GI; thought was related to her [place] Yogurt intake; symptoms seemed to improve after avoiding yogurt but re-flared again today; no blood in stool; no specific/ localized area of abdominal pain;     Objective:  Vitals:   02/17/20 1336  BP: 128/80  Pulse: 74  Temp: 98.3 F (36.8 C)  TempSrc: Oral  SpO2: 97%  Weight: 186 lb 4.8 oz (84.5 kg)  Height: 5\' 4"  (1.626 m)    General: Well developed, well nourished, in no acute distress  Skin : Warm and dry. Raised rash noted over right side of mid back Head: Normocephalic and atraumatic  Lungs: Respirations unlabored;  Abdomen: Soft; nontender;  Musculoskeletal: No deformities; no active joint inflammation  Extremities: No edema, cyanosis, clubbing  Vessels: Symmetric bilaterally  Neurologic: Alert and oriented; speech intact; face symmetrical; moves all extremities well; CNII-XII intact without focal deficit   Assessment:  1. Diarrhea, unspecified type   2. Rash and nonspecific skin eruption     Plan:  1. Referral to GI was done last week and is pending; check amylase/ lipase today; follow up to be determined; 2. Patient defers topical medication- short term oral prescription for Prednisone; follow up with her dermatologist with continued concerns;  This visit occurred during the SARS-CoV-2 public health emergency.   Safety protocols were in place, including screening questions prior to the visit, additional usage of staff PPE, and extensive cleaning of exam room while observing appropriate contact time as indicated for disinfecting solutions.     No follow-ups on file.  Orders Placed This Encounter  Procedures  . Amylase    Standing Status:   Future    Number of Occurrences:   1    Standing Expiration Date:   02/16/2021  . Lipase    Standing Status:   Future    Number of Occurrences:   1    Standing Expiration Date:   02/16/2021    Requested Prescriptions   Signed Prescriptions Disp Refills  . predniSONE (DELTASONE) 20 MG tablet 7 tablet 0    Sig: Take 1 tablet (  20 mg total) by mouth daily with breakfast.

## 2020-02-21 DIAGNOSIS — R69 Illness, unspecified: Secondary | ICD-10-CM | POA: Diagnosis not present

## 2020-02-21 DIAGNOSIS — Z79899 Other long term (current) drug therapy: Secondary | ICD-10-CM | POA: Diagnosis not present

## 2020-02-21 DIAGNOSIS — F3342 Major depressive disorder, recurrent, in full remission: Secondary | ICD-10-CM | POA: Diagnosis not present

## 2020-02-26 ENCOUNTER — Other Ambulatory Visit: Payer: Self-pay | Admitting: Family

## 2020-02-26 DIAGNOSIS — M79621 Pain in right upper arm: Secondary | ICD-10-CM

## 2020-03-02 ENCOUNTER — Telehealth: Payer: Self-pay | Admitting: Acute Care

## 2020-03-02 ENCOUNTER — Other Ambulatory Visit: Payer: Self-pay

## 2020-03-02 ENCOUNTER — Encounter: Payer: Self-pay | Admitting: Internal Medicine

## 2020-03-02 ENCOUNTER — Ambulatory Visit: Payer: Medicare HMO | Admitting: Internal Medicine

## 2020-03-02 VITALS — BP 140/70 | HR 80 | Ht 64.0 in | Wt 190.0 lb

## 2020-03-02 DIAGNOSIS — I739 Peripheral vascular disease, unspecified: Secondary | ICD-10-CM | POA: Diagnosis not present

## 2020-03-02 DIAGNOSIS — E781 Pure hyperglyceridemia: Secondary | ICD-10-CM

## 2020-03-02 DIAGNOSIS — Z87891 Personal history of nicotine dependence: Secondary | ICD-10-CM

## 2020-03-02 DIAGNOSIS — R0602 Shortness of breath: Secondary | ICD-10-CM | POA: Diagnosis not present

## 2020-03-02 DIAGNOSIS — F17210 Nicotine dependence, cigarettes, uncomplicated: Secondary | ICD-10-CM

## 2020-03-02 DIAGNOSIS — J42 Unspecified chronic bronchitis: Secondary | ICD-10-CM | POA: Diagnosis not present

## 2020-03-02 DIAGNOSIS — R69 Illness, unspecified: Secondary | ICD-10-CM | POA: Diagnosis not present

## 2020-03-02 DIAGNOSIS — F17200 Nicotine dependence, unspecified, uncomplicated: Secondary | ICD-10-CM

## 2020-03-02 NOTE — Patient Instructions (Signed)
Medication Instructions:  Your physician recommends that you continue on your current medications as directed. Please refer to the Current Medication list given to you today.  *If you need a refill on your cardiac medications before your next appointment, please call your pharmacy*   Lab Work: TODAY: BMET, BNP  Your physician recommends that you return for a FASTING lipid profile and liver function panel in 2 months  If you have labs (blood work) drawn today and your tests are completely normal, you will receive your results only by: Marland Kitchen MyChart Message (if you have MyChart) OR . A paper copy in the mail If you have any lab test that is abnormal or we need to change your treatment, we will call you to review the results.   Testing/Procedures: Your physician has requested that you have an echocardiogram. Echocardiography is a painless test that uses sound waves to create images of your heart. It provides your doctor with information about the size and shape of your heart and how well your heart's chambers and valves are working. This procedure takes approximately one hour. There are no restrictions for this procedure.  Your physician has requested that you have a lower extremity arterial duplex. This test is an ultrasound of the arteries in the legs. It looks at arterial blood flow in the legs. Allow one hour for Lower Arterial scans. There are no restrictions or special instructions  Your physician has requested that you have an ankle brachial index (ABI). During this test an ultrasound and blood pressure cuff are used to evaluate the arteries that supply the arms and legs with blood. Allow thirty minutes for this exam. There are no restrictions or special instructions.   Follow-Up: At [facility], you and your health needs are our priority.  As part of our continuing mission to provide you with exceptional heart care, we have created designated Provider Care Teams.  These Care Teams  include your primary Cardiologist (physician) and Advanced Practice Providers (APPs -  Physician Assistants and Nurse Practitioners) who all work together to provide you with the care you need, when you need it.  We recommend signing up for the patient portal called "MyChart".  Sign up information is provided on this After Visit Summary.  MyChart is used to connect with patients for Virtual Visits (Telemedicine).  Patients are able to view lab/test results, encounter notes, upcoming appointments, etc.  Non-urgent messages can be sent to your provider as well.   To learn more about what you can do with MyChart, go to [email or web address].    Your next appointment:   2 month(s)  The format for your next appointment:   In Person  Provider:   Rudean Haskell, MD   Other Instructions None

## 2020-03-02 NOTE — Telephone Encounter (Signed)
Spoke with pt and scheduled SDMV 04/06/20 2:30 CT ordered Nothing further needed

## 2020-03-02 NOTE — Progress Notes (Signed)
. Cardiology Office Note:    Date:  03/02/2020   ID:  Crystal Duarte, DOB Apr 29, 1960, MRN [id number]  PCP:  [name], MD  [facility] Cardiologist:  No primary care provider on file.  [facility] Electrophysiologist:  None   Referring MD: Marrian Salvage,*   CC: Elavated Triglycerides Consulted for the evaluation of shortness of breath at the behest of [name], MD  History of Present Illness:    Crystal Duarte is a 59 y.o. female with a hx of Pulmonary Coccidomycosis in 1996, COPD, Tobacco Abuse, Peripheral Arterial Disease who presents with SOB.    Patient notes that she has a lung screening scan 04/07/19.  Patient has different types of shortness of breath.  With COPD exacerbations, patient had problems catching her breath.  Patient works in Scientist, research (medical).  Patient is on her feet 8 hours a day.  With exertion, has right sided chest pain.  Patient notes that she feels that she can't catch her breath.  Has neck pain but this is separate from her right sided chest pain.  No jaw/abdominal/back/throat pain. No changes with DOE.  No orthopnea or PND.  Breathing does improve with inhaler.  Notes that when she walks around on her feet and walking all day she has leg pain and weakness.  Notes right heel pain.  No ulcers or sores on the leg.  Past Medical History:  Diagnosis Date  . Coccidioidomycosis, pulmonary ([place])   . Depression   . Hx of adenomatous polyp of colon 12/04/2014  . Major depressive disorder    since 14, electroconvulsive therapy, and transcranial magnetic stimulation recently for 6 weeks every day   . Osteopenia 01/2019   T score -2.1 FRAX 9.6% / 2.1% stable from prior DEXA  . Panic attack   . PONV (postoperative nausea and vomiting)   . VAIN I (vaginal intraepithelial neoplasia grade I) 2015   Positive HPV negative subtype 16, 18/45    Past Surgical History:  Procedure Laterality Date  . ABDOMINAL HYSTERECTOMY     TAH BSO  .  BILATERAL VATS ABLATION     vats for biopsy due to infection  . BREAST EXCISIONAL BIOPSY Right    benign  . COLONOSCOPY  2001   hemorrhoidectomy  . LUNG SURGERY  2001   VATS surgery   . OVARIAN CYST REMOVAL  1970's  . ULNAR NERVE TRANSPOSITION Left 12/06/2018   Procedure: LEFT ULNAR NERVE DECOMPRESSION,  ;  Surgeon: Leanora Cover, MD;  Location: [place];  Service: Orthopedics;  Laterality: Left;  Marland Kitchen VESICOVAGINAL FISTULA CLOSURE W/ TAH  2005  . WRIST ARTHROSCOPY Left 03/13/2018   Procedure: ARTHROSCOPY LEFT WRIST WITH DEBRIDEMENT;  Surgeon: Leanora Cover, MD;  Location: [place];  Service: Orthopedics;  Laterality: Left;  block   Current Medications: Current Meds  Medication Sig  . ANORO ELLIPTA 62.5-25 MCG/INH AEPB INHALE ONE DOSE BY MOUTH DAILY  . busPIRone (BUSPAR) 10 MG tablet 20 mg 3 (three) times daily.   . Fluticasone-Umeclidin-Vilant (TRELEGY ELLIPTA) 100-62.5-25 MCG/INH AEPB Use daily as directed  . gabapentin (NEURONTIN) 400 MG capsule Take 400 mg by mouth 2 (two) times daily.  Marland Kitchen gabapentin (NEURONTIN) 600 MG tablet Take 600 mg by mouth daily.   . Multiple Vitamin (MULTI-VITAMINS) TABS Take 1 tablet by mouth daily.   Marland Kitchen nystatin-triamcinolone ointment (MYCOLOG) Apply 1 application topically 2 (two) times daily.  . propranolol (INDERAL) 20 MG tablet Take 1 tablet (20 mg  total) by mouth 3 (three) times daily.  . rosuvastatin (CRESTOR) 10 MG tablet Take 1 tablet (10 mg total) by mouth daily.  . traZODone (DESYREL) 100 MG tablet Take 250 mg by mouth at bedtime.  Marland Kitchen venlafaxine XR (EFFEXOR-XR) 150 MG 24 hr capsule Take 150 mg by mouth daily with breakfast.   . venlafaxine XR (EFFEXOR-XR) 37.5 MG 24 hr capsule Take 37.5 mg by mouth daily.    Allergies:   Other, Latuda [lurasidone hcl], and Sulfa antibiotics   Social History   Socioeconomic History  . Marital status: Single    Spouse name: Not on file  . Number of children: 2  . Years of  education: Not on file  . Highest education level: Not on file  Occupational History  . Occupation: Herbalist    Comment: disability    Employer: [employer]  Tobacco Use  . Smoking status: Current Some Day Smoker    Packs/day: 0.25    Years: 42.00    Pack years: 10.50    Types: Cigarettes    Last attempt to quit: 09/25/2016    Years since quitting: 3.4  . Smokeless tobacco: Former Systems developer    Quit date: 06/13/2016  Vaping Use  . Vaping Use: Never used  Substance and Sexual Activity  . Alcohol use: No    Alcohol/week: 0.0 standard drinks  . Drug use: No  . Sexual activity: Not Currently    Birth control/protection: Surgical    Comment: 1st intercourse 59 yo-More than 5 partners  Other Topics Concern  . Not on file  Social History Narrative   Lives with son, daughter in law and 4 grandchildren    Social Determinants of Health   Financial Resource Strain:   . Difficulty of Paying Living Expenses: Not on file  Food Insecurity:   . Worried About Charity fundraiser in the Last Year: Not on file  . Ran Out of Food in the Last Year: Not on file  Transportation Needs:   . Lack of Transportation (Medical): Not on file  . Lack of Transportation (Non-Medical): Not on file  Physical Activity:   . Days of Exercise per Week: Not on file  . Minutes of Exercise per Session: Not on file  Stress:   . Feeling of Stress : Not on file  Social Connections:   . Frequency of Communication with Friends and Family: Not on file  . Frequency of Social Gatherings with Friends and Family: Not on file  . Attends Religious Services: Not on file  . Active Member of Clubs or Organizations: Not on file  . Attends Archivist Meetings: Not on file  . Marital Status: Not on file   Family History: The patient's family history includes Asthma in her mother; Cancer in her maternal grandmother and mother; Heart disease in her father; Ovarian cancer in her mother; Peripheral vascular disease  in her father; Varicose Veins in her mother. There is no history of Colon cancer. Father had CABG at age 51  ROS:   Please see the history of present illness.    All other systems reviewed and are negative.  EKGs/Labs/Other Studies Reviewed:    The following studies were reviewed today:  EKG:  EKG is  ordered today.  The ekg ordered today demonstrates sinus rhythm rate 73 no ST changes or TWI 11/03/2017 EKG: Sinus Tachycardia 109 no ST/T wave abnormalities  Recent Labs: 01/27/2020: ALT 19; BUN 9; Creat 0.83; Hemoglobin 13.8; Platelets 254; Potassium 4.6;  Sodium 140; TSH 3.15  Recent Lipid Panel    Component Value Date/Time   CHOL 192 01/27/2020 0940   TRIG 345 (H) 01/27/2020 0940   HDL 57 01/27/2020 0940   CHOLHDL 3.4 01/27/2020 0940   VLDL 37.2 01/21/2019 1352   LDLCALC 89 01/27/2020 0940   LDLDIRECT 77.0 07/28/2016 1428   11/29/2010- no aortic or coronary atherosclerosis  Risk Assessment/Calculations:     ASCVD Risk 7.7%  Physical Exam:    VS:  BP 140/70   Pulse 80   Ht 5\' 4"  (1.626 m)   Wt 190 lb (86.2 kg)   SpO2 95%   BMI 32.61 kg/m     Wt Readings from Last 3 Encounters:  03/02/20 190 lb (86.2 kg)  02/17/20 186 lb 4.8 oz (84.5 kg)  02/10/20 187 lb 12.8 oz (85.2 kg)    GEN: Well nourished, well developed in no acute distress HEENT: Normal NECK: No JVD; No carotid bruits LYMPHATICS: No lymphadenopathy CARDIAC: RRR, no murmurs, rubs, gallops RESPIRATORY:  Clear to auscultation with expiratory wheezes in bases ABDOMEN: Soft, non-tender, non-distended MUSCULOSKELETAL:  +1 edema LE R leg > left leg; No deformity  SKIN: Warm and dry NEUROLOGIC:  Alert and oriented x 3 PSYCHIATRIC:  Normal affect   ASSESSMENT:    1. PAD (peripheral artery disease) ([place])   2. SOB (shortness of breath)   3. High triglycerides   4. Chronic bronchitis, unspecified chronic bronchitis type ([place])   5. Smoker    PLAN:    In order of problems listed above:  SOB COPD - BMP  and BNP  - will get echocardiogram - likely will evaluation for CAD after 04/07/19 Low Dose CT Scan  Hypertriglyceridemia - (recent start) rosuvastatin 10 mg and check lipids and lfts in 2 months  Peripheral Arterial Disease - Bilateral Duplex Study and ABI's - continue statin  Tobacco Abuse- contemplative; has tried hyponsis in the past - discussed the dangers of tobacco use, both inhaled and oral, which include, but are not limited to cardiovascular disease, increased cancer risk of multiple types of cancer, COPD, peripheral arterial disease, strokes. - counseled on the benefits of smoking cessation. - firmly advised to quit.  - we also reviewed strategies to maximize success, including:  Removing cigarettes and smoking materials from environment   Stress management  Substitution of other forms of reinforcement (the one cigarette a day approach)  Support of family/friends and group smoking cessation  Selecting a quit date.   Patient provided contact information for [facility]uitline[place] or 1-800-QUIT-NOW  Patient provided with [place]'s 8 free smoking cessation classes: [phone] and [email or web address].[email or web address]   2-3 follow up unless new symptoms or abnormal test results warranting change in plan  Would be reasonable for Virtual Follow up Would be reasonable for APP Follow up    Shared Decision Making/Informed Consent   none    Medication Adjustments/Labs and Tests Ordered: Current medicines are reviewed at length with the patient today.  Concerns regarding medicines are outlined above.  Orders Placed This Encounter  Procedures  . Basic metabolic panel  . Pro b natriuretic peptide (BNP)  . Hepatic function panel  . Lipid panel  . EKG 12-Lead  . ECHOCARDIOGRAM COMPLETE  . VAS [place] ABI WITH/WO TBI  . VAS [place] LOWER EXTREMITY ARTERIAL DUPLEX   No orders of the defined types were placed in this encounter.   Patient Instructions  Medication Instructions:  Your  physician recommends that you continue on your current medications as directed.  Please refer to the Current Medication list given to you today.  *If you need a refill on your cardiac medications before your next appointment, please call your pharmacy*   Lab Work: TODAY: BMET, BNP  Your physician recommends that you return for a FASTING lipid profile and liver function panel in 2 months  If you have labs (blood work) drawn today and your tests are completely normal, you will receive your results only by: Marland Kitchen MyChart Message (if you have MyChart) OR . A paper copy in the mail If you have any lab test that is abnormal or we need to change your treatment, we will call you to review the results.   Testing/Procedures: Your physician has requested that you have an echocardiogram. Echocardiography is a painless test that uses sound waves to create images of your heart. It provides your doctor with information about the size and shape of your heart and how well your heart's chambers and valves are working. This procedure takes approximately one hour. There are no restrictions for this procedure.  Your physician has requested that you have a lower extremity arterial duplex. This test is an ultrasound of the arteries in the legs. It looks at arterial blood flow in the legs. Allow one hour for Lower Arterial scans. There are no restrictions or special instructions  Your physician has requested that you have an ankle brachial index (ABI). During this test an ultrasound and blood pressure cuff are used to evaluate the arteries that supply the arms and legs with blood. Allow thirty minutes for this exam. There are no restrictions or special instructions.   Follow-Up: At [facility], you and your health needs are our priority.  As part of our continuing mission to provide you with exceptional heart care, we have created designated Provider Care Teams.  These Care Teams include your primary Cardiologist  (physician) and Advanced Practice Providers (APPs -  Physician Assistants and Nurse Practitioners) who all work together to provide you with the care you need, when you need it.  We recommend signing up for the patient portal called "MyChart".  Sign up information is provided on this After Visit Summary.  MyChart is used to connect with patients for Virtual Visits (Telemedicine).  Patients are able to view lab/test results, encounter notes, upcoming appointments, etc.  Non-urgent messages can be sent to your provider as well.   To learn more about what you can do with MyChart, go to [email or web address].    Your next appointment:   2 month(s)  The format for your next appointment:   In Person  Provider:   Rudean Haskell, MD   Other Instructions None     Signed, Werner Lean, MD  03/02/2020 3:56 PM    [place]

## 2020-03-03 LAB — BASIC METABOLIC PANEL
BUN/Creatinine Ratio: 8 — ABNORMAL LOW (ref 12–28)
BUN: 6 mg/dL — ABNORMAL LOW (ref 8–27)
CO2: 22 mmol/L (ref 20–29)
Calcium: 9.5 mg/dL (ref 8.7–10.3)
Chloride: 103 mmol/L (ref 96–106)
Creatinine, Ser: 0.76 mg/dL (ref 0.57–1.00)
GFR calc Af Amer: 99 mL/min/{1.73_m2} (ref >59)
GFR calc non Af Amer: 86 mL/min/{1.73_m2} (ref >59)
Glucose: 90 mg/dL (ref 65–99)
Potassium: 4.5 mmol/L (ref 3.5–5.2)
Sodium: 140 mmol/L (ref 134–144)

## 2020-03-03 LAB — PRO B NATRIURETIC PEPTIDE: NT-Pro BNP: 57 pg/mL (ref 0–287)

## 2020-03-05 DIAGNOSIS — R69 Illness, unspecified: Secondary | ICD-10-CM | POA: Diagnosis not present

## 2020-03-17 ENCOUNTER — Other Ambulatory Visit: Payer: Self-pay | Admitting: Family

## 2020-03-30 ENCOUNTER — Ambulatory Visit (HOSPITAL_COMMUNITY)
Admission: RE | Admit: 2020-03-30 | Discharge: 2020-03-30 | Disposition: A | Discharge status: Home / Self Care | Payer: Medicare HMO | Source: Ambulatory Visit | Attending: Cardiovascular Disease | Admitting: Cardiovascular Disease

## 2020-03-30 ENCOUNTER — Ambulatory Visit (HOSPITAL_BASED_OUTPATIENT_CLINIC_OR_DEPARTMENT_OTHER): Payer: Medicare HMO

## 2020-03-30 ENCOUNTER — Other Ambulatory Visit: Payer: Self-pay

## 2020-03-30 DIAGNOSIS — R0602 Shortness of breath: Secondary | ICD-10-CM | POA: Insufficient documentation

## 2020-03-30 DIAGNOSIS — I739 Peripheral vascular disease, unspecified: Secondary | ICD-10-CM | POA: Diagnosis not present

## 2020-03-30 LAB — ECHOCARDIOGRAM COMPLETE
Area-P 1/2: 3.68 cm2
S' Lateral: 3.2 cm

## 2020-04-01 ENCOUNTER — Other Ambulatory Visit: Payer: Self-pay | Admitting: Internal Medicine

## 2020-04-06 ENCOUNTER — Ambulatory Visit (INDEPENDENT_AMBULATORY_CARE_PROVIDER_SITE_OTHER)
Admission: RE | Admit: 2020-04-06 | Discharge: 2020-04-06 | Disposition: A | Discharge status: Home / Self Care | Payer: Medicare HMO | Source: Ambulatory Visit | Attending: Internal Medicine | Admitting: Internal Medicine

## 2020-04-06 ENCOUNTER — Ambulatory Visit (INDEPENDENT_AMBULATORY_CARE_PROVIDER_SITE_OTHER): Payer: Medicare HMO | Admitting: Acute Care

## 2020-04-06 ENCOUNTER — Encounter: Payer: Self-pay | Admitting: Acute Care

## 2020-04-06 ENCOUNTER — Other Ambulatory Visit: Payer: Self-pay

## 2020-04-06 VITALS — BP 120/64 | HR 64 | Temp 97.6°F | Ht 64.0 in | Wt 190.0 lb

## 2020-04-06 DIAGNOSIS — F17210 Nicotine dependence, cigarettes, uncomplicated: Secondary | ICD-10-CM

## 2020-04-06 DIAGNOSIS — R69 Illness, unspecified: Secondary | ICD-10-CM | POA: Diagnosis not present

## 2020-04-06 DIAGNOSIS — Z87891 Personal history of nicotine dependence: Secondary | ICD-10-CM | POA: Diagnosis not present

## 2020-04-06 DIAGNOSIS — Z122 Encounter for screening for malignant neoplasm of respiratory organs: Secondary | ICD-10-CM

## 2020-04-06 NOTE — Patient Instructions (Signed)
Thank you for participating in the [facility] Screening Program. It was our pleasure to meet you today. We will call you with the results of your scan within the next few days. Your scan will be assigned a Lung RADS category score by the physicians reading the scans.  This Lung RADS score determines follow up scanning.  See below for description of categories, and follow up screening recommendations. We will be in touch to schedule your follow up screening annually or based on recommendations of our providers. We will fax a copy of your scan results to your Primary Care Physician, or the physician who referred you to the program, to ensure they have the results. Please call the office if you have any questions or concerns regarding your scanning experience or results.  Our office number is [phone]. Please speak with Denise Phelps, RN. She is our Lung Cancer Screening RN. If she is unavailable when you call, please have the office staff send her a message. She will return your call at her earliest convenience. Remember, if your scan is normal, we will scan you annually as long as you continue to meet the criteria for the program. (Age 55-77, Current smoker or smoker who has quit within the last 15 years). If you are a smoker, remember, quitting is the single most powerful action that you can take to decrease your risk of lung cancer and other pulmonary, breathing related problems. We know quitting is hard, and we are here to help.  Please let us know if there is anything we can do to help you meet your goal of quitting. If you are a former smoker, congratulations. We are proud of you! Remain smoke free! Remember you can refer friends or family members through the number above.  We will screen them to make sure they meet criteria for the program. Thank you for helping us take better care of you by participating in Lung Screening.  Lung RADS Categories:  Lung RADS 1: no nodules  or definitely non-concerning nodules.  Recommendation is for a repeat annual scan in 12 months.  Lung RADS 2:  nodules that are non-concerning in appearance and behavior with a very low likelihood of becoming an active cancer. Recommendation is for a repeat annual scan in 12 months.  Lung RADS 3: nodules that are probably non-concerning , includes nodules with a low likelihood of becoming an active cancer.  Recommendation is for a 6-month repeat screening scan. Often noted after an upper respiratory illness. We will be in touch to make sure you have no questions, and to schedule your 6-month scan.  Lung RADS 4 A: nodules with concerning findings, recommendation is most often for a follow up scan in 3 months or additional testing based on our provider's assessment of the scan. We will be in touch to make sure you have no questions and to schedule the recommended 3 month follow up scan.  Lung RADS 4 B:  indicates findings that are concerning. We will be in touch with you to schedule additional diagnostic testing based on our provider's  assessment of the scan.   

## 2020-04-06 NOTE — Progress Notes (Signed)
Shared Decision Making Visit Lung Cancer Screening Program [phone])   Eligibility:  Age 60 y.o.  Pack Years Smoking History Calculation 40 pack year smoking history (# packs/per year x # years smoked)  Recent History of coughing up blood  no  Unexplained weight loss? no ( >Than 15 pounds within the last 6 months )  Prior History Lung / other cancer no (Diagnosis within the last 5 years already requiring surveillance chest CT Scans).  Smoking Status Current Smoker  Former Smokers: Years since quit: NA  Quit Date: NA  Visit Components:  Discussion included one or more decision making aids. yes  Discussion included risk/benefits of screening. yes  Discussion included potential follow up diagnostic testing for abnormal scans. yes  Discussion included meaning and risk of over diagnosis. yes  Discussion included meaning and risk of False Positives. yes  Discussion included meaning of total radiation exposure. yes  Counseling Included:  Importance of adherence to annual lung cancer LDCT screening. yes  Impact of comorbidities on ability to participate in the program. yes  Ability and willingness to under diagnostic treatment. yes  Smoking Cessation Counseling:  Current Smokers:   Discussed importance of smoking cessation. yes  Information about tobacco cessation classes and interventions provided to patient. yes  Patient provided with "ticket" for LDCT Scan. yes  Symptomatic Patient. no  Counseling NA  Diagnosis Code: Tobacco Use Z72.0  Asymptomatic Patient yes  Counseling (Intermediate counseling: > three minutes counseling) V8938  Former Smokers:   Discussed the importance of maintaining cigarette abstinence. yes  Diagnosis Code: Personal History of Nicotine Dependence. B01.751  Information about tobacco cessation classes and interventions provided to patient. Yes  Patient provided with "ticket" for LDCT Scan. yes  Written Order for Lung Cancer  Screening with LDCT placed in Epic. Yes (CT Chest Lung Cancer Screening Low Dose W/O CM) [id number] Z12.2-Screening of respiratory organs Z87.891-Personal history of nicotine dependence  I have spent 25 minutes of face to face time with Ms. Justus discussing the risks and benefits of lung cancer screening. We viewed a power point together that explained in detail the above noted topics. We paused at intervals to allow for questions to be asked and answered to ensure understanding.We discussed that the single most powerful action that she can take to decrease her risk of developing lung cancer is to quit smoking. We discussed whether or not she is ready to commit to setting a quit date. We discussed options for tools to aid in quitting smoking including nicotine replacement therapy, non-nicotine medications, support groups, Quit Smart classes, and behavior modification. We discussed that often times setting smaller, more achievable goals, such as eliminating 1 cigarette a day for a week and then 2 cigarettes a day for a week can be helpful in slowly decreasing the number of cigarettes smoked. This allows for a sense of accomplishment as well as providing a clinical benefit. I gave her the " Be Stronger Than Your Excuses" card with contact information for community resources, classes, free nicotine replacement therapy, and access to mobile apps, text messaging, and on-line smoking cessation help. I have also given her my card and contact information in the event she needs to contact me. We discussed the time and location of the scan, and that either Abigail Miyamoto RN or I will call with the results within 24-48 hours of receiving them. I have offered her  a copy of the power point we viewed  as a resource in the event they need reinforcement  of the concepts we discussed today in the office. The patient verbalized understanding of all of  the above and had no further questions upon leaving the office. They have my  contact information in the event they have any further questions.  I spent 4 minutes counseling on smoking cessation and the health risks of continued tobacco abuse.  I explained to the patient that there has been a high incidence of coronary artery disease noted on these exams. I explained that this is a non-gated exam therefore degree or severity cannot be determined. This patient is currently on statin therapy. I have asked the patient to follow-up with their PCP regarding any incidental finding of coronary artery disease and management with diet or medication as their PCP  feels is clinically indicated. The patient verbalized understanding of the above and had no further questions upon completion of the visit.  Pt. Has had VATS in the past for a fungal mass. ( 2001)    Magdalen Spatz, NP 04/06/2020

## 2020-04-09 ENCOUNTER — Other Ambulatory Visit: Payer: Self-pay | Admitting: *Deleted

## 2020-04-09 DIAGNOSIS — F17210 Nicotine dependence, cigarettes, uncomplicated: Secondary | ICD-10-CM

## 2020-04-09 DIAGNOSIS — Z87891 Personal history of nicotine dependence: Secondary | ICD-10-CM

## 2020-04-09 NOTE — Progress Notes (Signed)
Please call patient and let them  know their  low dose Ct was read as a Lung RADS 2: nodules that are benign in appearance and behavior with a very low likelihood of becoming a clinically active cancer due to size or lack of growth. Recommendation per radiology is for a repeat LDCT in 12 months. .Please let them  know we will order and schedule their  annual screening scan for 04/2021 Please let them  know there was notation of CAD on their  scan.  Please remind the patient  that this is a non-gated exam therefore degree or severity of disease  cannot be determined. Please have them  follow up with their PCP regarding potential risk factor modification, dietary therapy or pharmacologic therapy if clinically indicated. Pt.  is  currently on statin therapy. Please place order for annual  screening scan for  04/2021 and fax results to PCP. Thanks so much. 

## 2020-04-13 ENCOUNTER — Ambulatory Visit
Admission: RE | Admit: 2020-04-13 | Discharge: 2020-04-13 | Disposition: A | Discharge status: Home / Self Care | Payer: Medicare HMO | Source: Ambulatory Visit | Attending: Family | Admitting: Family

## 2020-04-13 ENCOUNTER — Other Ambulatory Visit: Payer: Self-pay

## 2020-04-13 DIAGNOSIS — M79621 Pain in right upper arm: Secondary | ICD-10-CM

## 2020-04-13 DIAGNOSIS — N6489 Other specified disorders of breast: Secondary | ICD-10-CM | POA: Diagnosis not present

## 2020-04-13 DIAGNOSIS — R928 Other abnormal and inconclusive findings on diagnostic imaging of breast: Secondary | ICD-10-CM | POA: Diagnosis not present

## 2020-04-20 ENCOUNTER — Other Ambulatory Visit: Payer: Medicare HMO

## 2020-04-22 ENCOUNTER — Other Ambulatory Visit: Payer: Medicare HMO

## 2020-04-27 ENCOUNTER — Ambulatory Visit: Payer: Medicare HMO | Admitting: Internal Medicine

## 2020-04-27 NOTE — Progress Notes (Deleted)
. Cardiology Office Note:    Date:  04/27/2020   ID:  Crystal Duarte, DOB 1960/10/30, MRN [id number]  PCP:  [name], MD  [facility] Cardiologist:  No primary care provider on file.  [facility] Electrophysiologist:  None   Referring MD: [name], *   CC: Follow up SOB  History of Present Illness:    Crystal Duarte is a 60 y.o. female with a hx of Pulmonary Coccidomycosis in 1996, COPD, Tobacco Abuse, Aortic Atherosclerosis who presented with SOB 02/11/20.  In interval had normal BNP, echocardiogram, and ABIs.  Patient notes that she is doing ***.  Since day prior/last visit notes *** changes.  Relevant interval testing or therapy include ***.  There are no*** interval hospital/ED visit.    No chest pain or pressure ***.  No SOB/DOE*** and no PND/Orthopnea***.  No weight gain or leg swelling***.  No palpitations or syncope ***.  Ambulatory blood pressure ***.   Past Medical History:  Diagnosis Date  . Coccidioidomycosis, pulmonary ([place])   . Depression   . Hx of adenomatous polyp of colon 12/04/2014  . Major depressive disorder    since 14, electroconvulsive therapy, and transcranial magnetic stimulation recently for 6 weeks every day   . Osteopenia 01/2019   T score -2.1 FRAX 9.6% / 2.1% stable from prior DEXA  . Panic attack   . PONV (postoperative nausea and vomiting)   . VAIN I (vaginal intraepithelial neoplasia grade I) 2015   Positive HPV negative subtype 16, 18/45    Past Surgical History:  Procedure Laterality Date  . ABDOMINAL HYSTERECTOMY     TAH BSO  . BILATERAL VATS ABLATION     vats for biopsy due to infection  . BREAST EXCISIONAL BIOPSY Right    benign  . COLONOSCOPY  2001   hemorrhoidectomy  . LUNG SURGERY  2001   VATS surgery   . OVARIAN CYST REMOVAL  1970's  . ULNAR NERVE TRANSPOSITION Left 12/06/2018   Procedure: LEFT ULNAR NERVE DECOMPRESSION,  ;  Surgeon: Leanora Cover, MD;  Location: [facility];  Service: Orthopedics;  Laterality: Left;  Marland Kitchen VESICOVAGINAL FISTULA CLOSURE W/ TAH  2005  . WRIST ARTHROSCOPY Left 03/13/2018   Procedure: ARTHROSCOPY LEFT WRIST WITH DEBRIDEMENT;  Surgeon: Leanora Cover, MD;  Location: [facility];  Service: Orthopedics;  Laterality: Left;  block   Current Medications: No outpatient medications have been marked as taking for the 04/27/20 encounter (Appointment) with Werner Lean, MD.    Allergies:   Other, Latuda [lurasidone hcl], and Sulfa antibiotics   Social History   Socioeconomic History  . Marital status: Single    Spouse name: Not on file  . Number of children: 2  . Years of education: Not on file  . Highest education level: Not on file  Occupational History  . Occupation: Herbalist    Comment: disability    Employer: [employer]  Tobacco Use  . Smoking status: Current Some Day Smoker    Packs/day: 0.25    Years: 42.00    Pack years: 10.50    Types: Cigarettes    Last attempt to quit: 09/25/2016    Years since quitting: 3.5  . Smokeless tobacco: Former Systems developer    Quit date: 06/13/2016  Vaping Use  . Vaping Use: Never used  Substance and Sexual Activity  . Alcohol use: No    Alcohol/week: 0.0 standard drinks  . Drug use: No  .  Sexual activity: Not Currently    Birth control/protection: Surgical    Comment: 1st intercourse 60 yo-More than 5 partners  Other Topics Concern  . Not on file  Social History Narrative   Lives with son, daughter in Sports coach and 4 grandchildren    Social Determinants of Health   Financial Resource Strain: Not on file  Food Insecurity: Not on file  Transportation Needs: Not on file  Physical Activity: Not on file  Stress: Not on file  Social Connections: Not on file   SOCIAL: I see her mother  Family History: The patient's family history includes Asthma in her mother; Cancer in her maternal grandmother and mother; Heart disease in her father; Ovarian cancer in her  mother; Peripheral vascular disease in her father; Varicose Veins in her mother. There is no history of Colon cancer. Father had CABG at age 23  ROS:   Please see the history of present illness.    All other systems reviewed and are negative.  EKGs/Labs/Other Studies Reviewed:    The following studies were reviewed today:  EKG:   03/02/20: sinus rhythm rate 73 no ST changes or TWI 11/03/2017 EKG: Sinus Tachycardia 109 no ST/T wave abnormalities  Duplex and ABIs: Date: 03/30/2020 Results: Summary:  Right: Resting right ankle-brachial index is within normal range. No  evidence of significant right lower extremity arterial disease. The right  toe-brachial index is normal.   Left: Resting left ankle-brachial index is within normal range. No  evidence of significant left lower extremity arterial disease. The left  toe-brachial index is normal.   Transthoracic Echocardiogram: Date: 03/30/2020 Results: 1. Left ventricular ejection fraction, by estimation, is 60 to 65%. The  left ventricle has normal function. The left ventricle has no regional  wall motion abnormalities. Left ventricular diastolic parameters were  normal.  2. Right ventricular systolic function is normal. The right ventricular  size is normal. Tricuspid regurgitation signal is inadequate for assessing  PA pressure.  3. The mitral valve is normal in structure. No evidence of mitral valve  regurgitation. No evidence of mitral stenosis.  4. The aortic valve is tricuspid. Aortic valve regurgitation is not  visualized. No aortic stenosis is present.    NonCardiac CT: Date:04/06/2020 Results:No coronary artery calcifications with small aortic arch calcification  Recent Labs: 01/27/2020: ALT 19; Hemoglobin 13.8; Platelets 254; TSH 3.15 03/02/2020: BUN 6; Creatinine, Ser 0.76; NT-Pro BNP 57; Potassium 4.5; Sodium 140  Recent Lipid Panel    Component Value Date/Time   CHOL 192 01/27/2020 0940   TRIG 345 (H)  01/27/2020 0940   HDL 57 01/27/2020 0940   CHOLHDL 3.4 01/27/2020 0940   VLDL 37.2 01/21/2019 1352   LDLCALC 89 01/27/2020 0940   LDLDIRECT 77.0 07/28/2016 1428   Risk Assessment/Calculations:     ASCVD Risk 7.7%  Physical Exam:    VS:  There were no vitals taken for this visit.    Wt Readings from Last 3 Encounters:  04/06/20 190 lb (86.2 kg)  03/02/20 190 lb (86.2 kg)  02/17/20 186 lb 4.8 oz (84.5 kg)    GEN: Well nourished, well developed in no acute distress HEENT: Normal NECK: No JVD; No carotid bruits LYMPHATICS: No lymphadenopathy CARDIAC: RRR, no murmurs, rubs, gallops RESPIRATORY:  Clear to auscultation with expiratory wheezes in bases ABDOMEN: Soft, non-tender, non-distended MUSCULOSKELETAL:  ***; No deformity  SKIN: Warm and dry NEUROLOGIC:  Alert and oriented x 3 PSYCHIATRIC:  Normal affect   ASSESSMENT:    No diagnosis found.  PLAN:    In order of problems listed above:  DOE concerning for angina equivalent *** - The patient presents with cardiac/possibly cardiac/non-cardiac *** - EKG shows *** without evidence of accessory pathway, ventricular pacing, digoxin use, LBBB, or baseline ST changes. - ASCVD risk estimated at ***.  - Would recommend CCTA with possible FFR as needed to exclude obstructive CAD and to assess for non-obstructive CAD requiring secondary prevention - Would recommend exercise/pharmacological ***nuclear medicine stress test (NPO at midnight/hold beta blocker in AM); discussed risks, benefits, and alternatives of the diagnostic procedure including chest pain, arrhythmia, and death.  Patient amenable for testing. - if positive, discussed risks and benefits of cardiac catheterization have been discussed with the patient.  These include bleeding, infection, kidney damage, stroke, heart attack, death.  The patient understands these risks and is willing to proceed if necessary   Hypertriglyceridemia Aortic Atherosclerosis - will check  fasting lipids on rosuvastatin ***  Tobacco Abuse- contemplative; has tried hyponsis in the past*** - discussed the dangers of tobacco use, both inhaled and oral, which include, but are not limited to cardiovascular disease, increased cancer risk of multiple types of cancer, COPD, peripheral arterial disease, strokes. - counseled on the benefits of smoking cessation. - firmly advised to quit.  - we also reviewed strategies to maximize success, including:  Removing cigarettes and smoking materials from environment   Stress management  Substitution of other forms of reinforcement (the one cigarette a day approach)  Support of family/friends and group smoking cessation  Selecting a quit date.   Patient provided contact information for [facility] or 1-800-QUIT-NOW  Patient provided with [facility] 8 free smoking cessation classes: [phone] ([phone] and [employer]fr   *** follow up unless new symptoms or abnormal test results warranting change in plan  Would be reasonable for *** Virtual Follow up  Would be reasonable for *** APP Follow up     Shared Decision Making/Informed Consent   none    Medication Adjustments/Labs and Tests Ordered: Current medicines are reviewed at length with the patient today.  Concerns regarding medicines are outlined above.  No orders of the defined types were placed in this encounter.  No orders of the defined types were placed in this encounter.   There are no Patient Instructions on file for this visit.   Signed, Werner Lean, MD  04/27/2020 10:57 AM    [place]

## 2020-05-05 ENCOUNTER — Other Ambulatory Visit: Payer: Self-pay

## 2020-05-05 ENCOUNTER — Encounter: Payer: Self-pay | Admitting: Internal Medicine

## 2020-05-05 ENCOUNTER — Ambulatory Visit: Payer: Medicare HMO | Admitting: Internal Medicine

## 2020-05-05 DIAGNOSIS — F17210 Nicotine dependence, cigarettes, uncomplicated: Secondary | ICD-10-CM

## 2020-05-05 DIAGNOSIS — J449 Chronic obstructive pulmonary disease, unspecified: Secondary | ICD-10-CM | POA: Diagnosis not present

## 2020-05-05 DIAGNOSIS — R69 Illness, unspecified: Secondary | ICD-10-CM | POA: Diagnosis not present

## 2020-05-05 MED ORDER — BREZTRI AEROSPHERE 160-9-4.8 MCG/ACT IN AERO: 2.0000 | INHALATION_SPRAY | Freq: Two times a day (BID) | RESPIRATORY_TRACT | 11 refills | Status: DC

## 2020-05-05 MED ORDER — ALBUTEROL SULFATE HFA 108 (90 BASE) MCG/ACT IN AERS: 1.0000 | INHALATION_SPRAY | RESPIRATORY_TRACT | 2 refills | Status: DC | PRN

## 2020-05-05 MED ORDER — BREZTRI AEROSPHERE 160-9-4.8 MCG/ACT IN AERO: 2.0000 | INHALATION_SPRAY | Freq: Two times a day (BID) | RESPIRATORY_TRACT | 0 refills | Status: DC

## 2020-05-05 NOTE — Progress Notes (Signed)
Subjective:     Patient ID: Crystal Duarte, female   DOB: 1960/09/28    MRN: [id number]    Brief patient profile:  2 yowf former smoker quit June 2018  born in [place] 2015 with onset of "sinus problem" on arrival stuffy nose more in spring better on [place] worse since spring / summer 2018 then gradually worse sob around July 2018 assoc with chest tightness and dry cough assoc with wt loss from baseline 200  referred to pulmonary clinic 12/26/2016 by Dr   Sharlet Salina with prob GOLD II copd on initial eval   S/p lung bx at [facility] c/w "[place] fungus around 2001" presumably cocci   12/26/2016 1st [facility] office visit/ Amirrah Quigley   Chief Complaint  Patient presents with  . Pulmonary Consult    Referred by Dr. Pricilla Holm. Pt c/o DOE x 2 months- gets winded walking up stairs.  She also c/o non prod cough.   sob vacuuming / doe x MMRC2 = can't walk a nl pace on a flat grade s sob but does fine slow and flat eg shopping  Sleeping ok s cough / cough is dry daytime > noct rec Try dulera 100 Take x 1  puffs first thing in am and then another 1puffs about 12 hours later and  If not better then 2 puffs every 12 hours and fill the Rx if improve - in not>> don't fill it GERD diet    05/05/2020  f/u ov/Abrina Petz re-establish p > 3 y absence   Re copd / anoro but no saba  Chief Complaint  Patient presents with  . Follow-up    Patient wants to work on quitting smoking, states she has a lot of wheezing going on. States that she did not like the Trelegy and feels like it didn't help anymore than the Anoro and would like to switch back.   Dyspnea:  Vacuuming is difficulty stopsp  half a room / appt to mother's which is a mile and half stops 3 x  Cough: variable hs and variably am not waking her though / mucoid Sleeping: 45 degrees x months w/o worse cough  SABA use: none  02: none    No obvious day to day or daytime variability or assoc excess/ purulent  sputum or mucus plugs or hemoptysis or cp or chest tightness, subjective wheeze or overt sinus or hb symptoms.   Sleeping as above without nocturnal  or early am exacerbation  of respiratory  c/o's or need for noct saba. Also denies any obvious fluctuation of symptoms with weather or environmental changes or other aggravating or alleviating factors except as outlined above   No unusual exposure hx or h/o childhood pna/ asthma or knowledge of premature birth.  Current Allergies, Complete Past Medical History, Past Surgical History, Family History, and Social History were reviewed in [facility] record.  ROS  The following are not active complaints unless bolded Hoarseness, sore throat, dysphagia, dental problems, itching, sneezing,  nasal congestion or discharge of excess mucus or purulent secretions, ear ache,   fever, chills, sweats, unintended wt loss or wt gain, classically pleuritic or exertional cp,  orthopnea pnd or arm/hand swelling  or leg swelling, presyncope, palpitations, abdominal pain, anorexia, nausea, vomiting, diarrhea  or change in bowel habits or change in bladder habits, change in stools or change in urine, dysuria, hematuria,  rash, arthralgias, visual complaints, headache, numbness, weakness or ataxia or problems with walking or  coordination,  change in mood or  memory.        Current Meds  Medication Sig  . ANORO ELLIPTA 62.5-25 MCG/INH AEPB INHALE ONE DOSE BY MOUTH DAILY  . busPIRone (BUSPAR) 10 MG tablet 20 mg 3 (three) times daily.   Marland Kitchen gabapentin (NEURONTIN) 400 MG capsule Take 400 mg by mouth 2 (two) times daily.  Marland Kitchen gabapentin (NEURONTIN) 600 MG tablet Take 600 mg by mouth daily.   . Multiple Vitamin (MULTI-VITAMINS) TABS Take 1 tablet by mouth daily.   . propranolol (INDERAL) 20 MG tablet Take 1 tablet (20 mg total) by mouth 3 (three) times daily.  . rosuvastatin (CRESTOR) 10 MG tablet TAKE ONE TABLET BY MOUTH DAILY  . traZODone (DESYREL) 100 MG  tablet Take 250 mg by mouth at bedtime.  Marland Kitchen venlafaxine XR (EFFEXOR-XR) 150 MG 24 hr capsule Take 150 mg by mouth daily with breakfast.   . venlafaxine XR (EFFEXOR-XR) 37.5 MG 24 hr capsule Take 37.5 mg by mouth daily.              Objective:   Physical Exam    05/05/2020          182   12/26/16 181 lb (82.1 kg)  12/14/16 178 lb (80.7 kg)  10/13/16 199 lb (90.3 kg)    Vital signs reviewed  05/05/2020  - Note at rest 02 sats  95% on RA   General appearance:    Middle aged wf nad   HEENT : pt wearing mask not removed for exam due to covid - 19 concerns.   NECK :  without JVD/Nodes/TM/ nl carotid upstrokes bilaterally   LUNGS: no acc muscle use,  Min barrel  contour chest wall with bilateral  slightly decreased bs s audible wheeze and  without cough on insp or exp maneuvers and min  Hyperresonant  to  percussion bilaterally     CV:  RRR  no s3 or murmur or increase in P2, and no edema   ABD:  soft and nontender with pos end  insp Hoover's  in the supine position. No bruits or organomegaly appreciated, bowel sounds nl  MS:   Nl gait/  ext warm without deformities, calf tenderness, cyanosis or clubbing No obvious joint restrictions   SKIN: warm and dry without lesions    NEURO:  alert, approp, nl sensorium with  no motor or cerebellar deficits apparent.              Assessment:

## 2020-05-05 NOTE — Patient Instructions (Addendum)
Plan A = Automatic = Always=    breztri Take 2 puffs first thing in am and then another 2 puffs about 12 hours later.    Work on inhaler technique:  relax and gently blow all the way out then take a nice smooth deep breath back in, triggering the inhaler at same time you start breathing in.  Hold for up to 5 seconds if you can. Blow out thru nose. Rinse and gargle with water when done      Plan B = Backup (to supplement plan A, not to replace it) Only use your albuterol inhaler as a rescue medication to be used if you can't catch your breath by resting or doing a relaxed purse lip breathing pattern.  - The less you use it, the better it will work when you need it. - Ok to use the inhaler up to 2 puffs  every 4 hours if you must but call for appointment if use goes up over your usual need - Don't leave home without it !!  (think of it like the spare tire for your car)   Plan C = Crisis (instead of Plan B but only if Plan B stops working) - only use your albuterol nebulizer if you first try Plan B and it fails to help > ok to use the nebulizer up to every 4 hours but if start needing it regularly call for immediate appointment   If still coughing > Try prilosec otc 20mg   Take 30-60 min before first meal of the day and Pepcid ac  cough is completely gone for at least a week without the need for cough suppression   Please schedule a follow up office visit in 6 weeks, call sooner if needed - bring inhalers   Please schedule a follow up office visit in 6 weeks, call sooner if needed

## 2020-05-05 NOTE — Assessment & Plan Note (Addendum)
Quit smoking 09/2016 with worsening sob since 10/2016   Spirometry 12/26/2016  FEV1 1.63 (60%)  Ratio 70 with curvature,  Texp < 6 sec  - 12/26/2016   try dulera 100 one bid as sensitive to SABA  > changed to anoro then trelegy  - 05/05/2020  After extensive coaching inhaler device,  effectiveness =    75% (short Ti)    Group D in terms of symptom/risk and laba/lama/ICS  therefore appropriate rx at this point >>>  Try breztri and prn saba  I spent extra time with pt today reviewing appropriate use of albuterol for prn use on exertion with the following points: 1) saba is for relief of sob that does not improve by walking a slower pace or resting but rather if the pt does not improve after trying this first. 2) If the pt is convinced, as many are, that saba helps recover from activity faster then it's easy to tell if this is the case by re-challenging : ie stop, take the inhaler, then p 5 minutes try the exact same activity (intensity of workload) that just caused the symptoms and see if they are substantially diminished or not after saba 3) if there is an activity that reproducibly causes the symptoms, try the saba 15 min before the activity on alternate days   If in fact the saba really does help, then fine to continue to use it prn but advised may need to look closer at the maintenance regimen being used to achieve better control of airways disease with exertion.   Also advised re use of Inderal not ideal in this setting > she will discuss with psych

## 2020-05-05 NOTE — Assessment & Plan Note (Signed)
.  Counseled re importance of smoking cessation but did not meet time criteria for separate billing            Each maintenance medication was reviewed in detail including emphasizing most importantly the difference between maintenance and prns and under what circumstances the prns are to be triggered using an action plan format where appropriate.  Total time for H and P, chart review, counseling, reviewing hfa device(s) and generating customized AVS unique to this office visit / same day charting  > 35 min

## 2020-05-06 DIAGNOSIS — R69 Illness, unspecified: Secondary | ICD-10-CM | POA: Diagnosis not present

## 2020-05-07 DIAGNOSIS — R69 Illness, unspecified: Secondary | ICD-10-CM | POA: Diagnosis not present

## 2020-05-08 DIAGNOSIS — R69 Illness, unspecified: Secondary | ICD-10-CM | POA: Diagnosis not present

## 2020-05-12 DIAGNOSIS — R69 Illness, unspecified: Secondary | ICD-10-CM | POA: Diagnosis not present

## 2020-05-13 DIAGNOSIS — R69 Illness, unspecified: Secondary | ICD-10-CM | POA: Diagnosis not present

## 2020-05-14 DIAGNOSIS — R69 Illness, unspecified: Secondary | ICD-10-CM | POA: Diagnosis not present

## 2020-05-15 DIAGNOSIS — R69 Illness, unspecified: Secondary | ICD-10-CM | POA: Diagnosis not present

## 2020-05-18 DIAGNOSIS — R69 Illness, unspecified: Secondary | ICD-10-CM | POA: Diagnosis not present

## 2020-05-19 DIAGNOSIS — R69 Illness, unspecified: Secondary | ICD-10-CM | POA: Diagnosis not present

## 2020-05-20 DIAGNOSIS — R69 Illness, unspecified: Secondary | ICD-10-CM | POA: Diagnosis not present

## 2020-05-21 DIAGNOSIS — R69 Illness, unspecified: Secondary | ICD-10-CM | POA: Diagnosis not present

## 2020-05-22 DIAGNOSIS — R69 Illness, unspecified: Secondary | ICD-10-CM | POA: Diagnosis not present

## 2020-05-26 DIAGNOSIS — R69 Illness, unspecified: Secondary | ICD-10-CM | POA: Diagnosis not present

## 2020-05-27 DIAGNOSIS — R69 Illness, unspecified: Secondary | ICD-10-CM | POA: Diagnosis not present

## 2020-05-28 DIAGNOSIS — R69 Illness, unspecified: Secondary | ICD-10-CM | POA: Diagnosis not present

## 2020-05-29 DIAGNOSIS — R69 Illness, unspecified: Secondary | ICD-10-CM | POA: Diagnosis not present

## 2020-06-02 DIAGNOSIS — R69 Illness, unspecified: Secondary | ICD-10-CM | POA: Diagnosis not present

## 2020-06-08 DIAGNOSIS — Z79891 Long term (current) use of opiate analgesic: Secondary | ICD-10-CM | POA: Diagnosis not present

## 2020-06-08 DIAGNOSIS — F411 Generalized anxiety disorder: Secondary | ICD-10-CM | POA: Diagnosis not present

## 2020-06-08 DIAGNOSIS — R69 Illness, unspecified: Secondary | ICD-10-CM | POA: Diagnosis not present

## 2020-06-10 DIAGNOSIS — R69 Illness, unspecified: Secondary | ICD-10-CM | POA: Diagnosis not present

## 2020-06-16 ENCOUNTER — Ambulatory Visit: Payer: Medicare HMO | Admitting: Internal Medicine

## 2020-07-08 DIAGNOSIS — R69 Illness, unspecified: Secondary | ICD-10-CM | POA: Diagnosis not present

## 2020-07-08 DIAGNOSIS — F411 Generalized anxiety disorder: Secondary | ICD-10-CM | POA: Diagnosis not present

## 2020-07-13 DIAGNOSIS — G8929 Other chronic pain: Secondary | ICD-10-CM | POA: Diagnosis not present

## 2020-07-13 DIAGNOSIS — M25532 Pain in left wrist: Secondary | ICD-10-CM | POA: Diagnosis not present

## 2020-07-14 DIAGNOSIS — R69 Illness, unspecified: Secondary | ICD-10-CM | POA: Diagnosis not present

## 2020-07-14 DIAGNOSIS — F411 Generalized anxiety disorder: Secondary | ICD-10-CM | POA: Diagnosis not present

## 2020-07-21 DIAGNOSIS — Z961 Presence of intraocular lens: Secondary | ICD-10-CM | POA: Diagnosis not present

## 2020-07-21 DIAGNOSIS — H52202 Unspecified astigmatism, left eye: Secondary | ICD-10-CM | POA: Diagnosis not present

## 2020-07-21 DIAGNOSIS — H524 Presbyopia: Secondary | ICD-10-CM | POA: Diagnosis not present

## 2020-07-21 DIAGNOSIS — H5213 Myopia, bilateral: Secondary | ICD-10-CM | POA: Diagnosis not present

## 2020-07-23 DIAGNOSIS — F411 Generalized anxiety disorder: Secondary | ICD-10-CM | POA: Diagnosis not present

## 2020-07-23 DIAGNOSIS — R69 Illness, unspecified: Secondary | ICD-10-CM | POA: Diagnosis not present

## 2020-07-29 DIAGNOSIS — F411 Generalized anxiety disorder: Secondary | ICD-10-CM | POA: Diagnosis not present

## 2020-07-29 DIAGNOSIS — R69 Illness, unspecified: Secondary | ICD-10-CM | POA: Diagnosis not present

## 2020-08-02 NOTE — Progress Notes (Signed)
08/02/2020 Crystal Duarte [id number] 13-Mar-1961   CHIEF COMPLAINT: Chronic diarrhea   HISTORY OF PRESENT ILLNESS:  Crystal Duarte is a 60 year old female with a past medical history of anxiety, depression, benign lung mass s/p VATS in 2001 and colon polyps.  Presents to our office today as referred by Dr. Sharlet Salina for further evaluation regarding diarrhea.  She reports having daily diarrhea since December 2021.  No specific food or stress triggers.  No associated abdominal pain.  She describes passing 2-3 nonbloody brown to greenish diarrhea stools daily.  No rectal bleeding or melena.  No antibiotics within the past 3 to 4 months.  Her newest medication lovastatin was started about 6 months ago.  She stopped eating yogurt.  She uses cream in her coffee otherwise no other dairy products.  She has mild nausea and sometimes feels full easily.  No heartburn or dysphagia.  She reports losing 20 pounds over the past 4 to 5 months.  No fever, sweats or chills.  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  She underwent a colonoscopy by Dr. Carlean Purl 11/2014 which identified two 46mm polyps which were removed from the cecum and ascending colon.  Biopsies of the cecal polyp showed benign polypoid mucosa and the other polyp was tubular adenomatous.  A repeat colonoscopy in 7 years was recommended.  No known family history of colorectal cancer.  Colonoscopy by Dr. Carlean Purl 11/2014: 1. Two 68mm sessile polyps were found at the cecum and in the descending colon; polypectomies were performed with cold forceps 2. The examination was otherwise normal - Recall colonoscopy 11/2021 Biopsy Report: 1. Surgical [P], cecum, polyp - BENIGN POLYPOID COLONIC MUCOSA. - NO ADENOMATOUS CHANGE OR MALIGNANCY. 2. Surgical [P], descending, polyp - TUBULAR ADENOMA. NO HIGH GRADE DYSPLASIA OR MALIGNANCY IDENTIFIED.  ECHO 03/30/2020: 1. Left ventricular ejection fraction, by estimation, is 60 to  65%. The left ventricle has normal function. The left ventricle has no regional wall motion abnormalities. Left ventricular diastolic parameters were normal. 2. Right ventricular systolic function is normal. The right ventricular size is normal. Tricuspid regurgitation signal is inadequate for assessing PA pressure. 3. The mitral valve is normal in structure. No evidence of mitral valve regurgitation. No evidence of mitral stenosis. 4. The aortic valve is tricuspid. Aortic valve regurgitation is not visualized. No aortic stenosis is present.   Past Medical History:  Diagnosis Date  . Coccidioidomycosis, pulmonary ([place])   . Depression   . Hx of adenomatous polyp of colon 12/04/2014  . Major depressive disorder    since 14, electroconvulsive therapy, and transcranial magnetic stimulation recently for 6 weeks every day   . Osteopenia 01/2019   T score -2.1 FRAX 9.6% / 2.1% stable from prior DEXA  . Panic attack   . PONV (postoperative nausea and vomiting)   . VAIN I (vaginal intraepithelial neoplasia grade I) 2015   Positive HPV negative subtype 16, 18/45   Past Surgical History:  Procedure Laterality Date  . ABDOMINAL HYSTERECTOMY     TAH BSO  . BILATERAL VATS ABLATION     vats for biopsy due to infection  . BREAST EXCISIONAL BIOPSY Right    benign  . COLONOSCOPY  2001   hemorrhoidectomy  . LUNG SURGERY  2001   VATS surgery   . OVARIAN CYST REMOVAL  1970's  . ULNAR NERVE TRANSPOSITION Left 12/06/2018   Procedure: LEFT ULNAR NERVE DECOMPRESSION,  ;  Surgeon: Leanora Cover, MD;  Location: [facility];  Service: Orthopedics;  Laterality: Left;  Marland Kitchen VESICOVAGINAL FISTULA CLOSURE W/ TAH  2005  . WRIST ARTHROSCOPY Left 03/13/2018   Procedure: ARTHROSCOPY LEFT WRIST WITH DEBRIDEMENT;  Surgeon: Leanora Cover, MD;  Location: [facility];  Service: Orthopedics;  Laterality: Left;  block   Social History: She smokes cigarettes 1ppd 30 years. No alcohol or drug use.    Family History:  Mother with ovarian cancer.  Father with heart disease and vascular disease.  Maternal grandmother with lung cancer.    Allergies  Allergen Reactions  . Other Nausea And Vomiting    novacaine   . Latuda [Lurasidone Hcl] Anxiety  . Sulfa Antibiotics Rash    Only in sunlight       Outpatient Encounter Medications as of 08/03/2020  Medication Sig  . albuterol (PROAIR HFA) 108 (90 Base) MCG/ACT inhaler Inhale 1-2 puffs into the lungs every 4 (four) hours as needed for wheezing.  . Budeson-Glycopyrrol-Formoterol (BREZTRI AEROSPHERE) 160-9-4.8 MCG/ACT AERO Inhale 2 puffs into the lungs 2 (two) times daily.  . Budeson-Glycopyrrol-Formoterol (BREZTRI AEROSPHERE) 160-9-4.8 MCG/ACT AERO Inhale 2 puffs into the lungs 2 (two) times daily.  . busPIRone (BUSPAR) 10 MG tablet 20 mg 3 (three) times daily.   Marland Kitchen gabapentin (NEURONTIN) 400 MG capsule Take 400 mg by mouth 2 (two) times daily.  Marland Kitchen gabapentin (NEURONTIN) 600 MG tablet Take 600 mg by mouth daily.   . Multiple Vitamin (MULTI-VITAMINS) TABS Take 1 tablet by mouth daily.   . propranolol (INDERAL) 20 MG tablet Take 1 tablet (20 mg total) by mouth 3 (three) times daily.  . rosuvastatin (CRESTOR) 10 MG tablet TAKE ONE TABLET BY MOUTH DAILY  . traZODone (DESYREL) 100 MG tablet Take 250 mg by mouth at bedtime.  Marland Kitchen venlafaxine XR (EFFEXOR-XR) 150 MG 24 hr capsule Take 150 mg by mouth daily with breakfast.   . venlafaxine XR (EFFEXOR-XR) 37.5 MG 24 hr capsule Take 37.5 mg by mouth daily.   No facility-administered encounter medications on file as of 08/03/2020.   REVIEW OF SYSTEMS: Gen: Denies fever, sweats or chills. No weight loss.  CV: Denies chest pain, palpitations or edema. Resp: Denies cough, shortness of breath of hemoptysis.  GI: See HPI.  GU : Denies urinary burning, blood in urine, increased urinary frequency or incontinence. MS: Denies joint pain, muscles aches or weakness. Derm: Denies rash, itchiness, skin lesions or  unhealing ulcers. Psych: Denies depression, anxiety or memory loss. Heme: Denies bruising, bleeding. Neuro:  Denies headaches, dizziness or paresthesias. Endo:  Denies any problems with DM, thyroid or adrenal function.   PHYSICAL EXAM: BP 94/60   Pulse 76   Ht 5\' 4"  (1.626 m)   Wt 173 lb 6.4 oz (78.7 kg)   BMI 29.76 kg/m   General: 60 year old female in no acute distress. Head: Normocephalic and atraumatic. Eyes:  Sclerae non-icteric, conjunctive pink. Ears: Normal auditory acuity. Mouth: Dentition intact. No ulcers or lesions.  Neck: Supple, no lymphadenopathy or thyromegaly.  Lungs: Clear bilaterally to auscultation without wheezes, crackles or rhonchi. Heart: Regular rate and rhythm. No murmur, rub or gallop appreciated.  Abdomen: Soft, nontender, non distended. No masses. No hepatosplenomegaly. Normoactive bowel sounds x 4 quadrants.  Rectal: Deferred.  Musculoskeletal: Symmetrical with no gross deformities. Skin: Warm and dry. No rash or lesions on visible extremities. Extremities: No edema. Neurological: Alert oriented x 4, no focal deficits.  Psychological:  Alert and cooperative. Normal mood and affect.  ASSESSMENT AND PLAN:  47.  60 year old female with daily diarrhea since 03/2020 and intermittent abdominal cramping  -GI pathogen panel, CBC, CMP, CRP, TTG and IgA -I discussed scheduling a diagnostic colonoscopy if diarrhea persists or worsens -Consider trial of cholestyramine, await GI pathogen results -Dicyclomine 10 mg 1 p.o. 3 times daily as needed for abdominal cramping  2. Weight loss -See plan in #1 -I discussed scheduling an abdominal/pelvic CT scan if the above labs unrevealing and if symptoms persist  3.  Early satiety -I recommended a 3-4 small snacks sized meals -See plan in #1 and 2  Further recommendations to be determined after the above lab results reviewed Patient will call our office if her symptoms worsen    CC:  [name],  *

## 2020-08-03 ENCOUNTER — Encounter: Payer: Self-pay | Admitting: Nurse Practitioner

## 2020-08-03 ENCOUNTER — Ambulatory Visit (INDEPENDENT_AMBULATORY_CARE_PROVIDER_SITE_OTHER): Payer: Medicare HMO | Admitting: Nurse Practitioner

## 2020-08-03 ENCOUNTER — Other Ambulatory Visit (INDEPENDENT_AMBULATORY_CARE_PROVIDER_SITE_OTHER): Payer: Medicare HMO

## 2020-08-03 VITALS — BP 94/60 | HR 76 | Ht 64.0 in | Wt 173.4 lb

## 2020-08-03 DIAGNOSIS — R197 Diarrhea, unspecified: Secondary | ICD-10-CM | POA: Diagnosis not present

## 2020-08-03 DIAGNOSIS — Z860100 Personal history of colon polyps, unspecified: Secondary | ICD-10-CM

## 2020-08-03 DIAGNOSIS — R6881 Early satiety: Secondary | ICD-10-CM

## 2020-08-03 LAB — CBC WITH DIFFERENTIAL/PLATELET
Basophils Absolute: 0.1 10*3/uL (ref 0.0–0.1)
Basophils Relative: 0.7 % (ref 0.0–3.0)
Eosinophils Absolute: 0.3 10*3/uL (ref 0.0–0.7)
Eosinophils Relative: 2.6 % (ref 0.0–5.0)
HCT: 43.2 % (ref 36.0–46.0)
Hemoglobin: 14.6 g/dL (ref 12.0–15.0)
Lymphocytes Relative: 28.9 % (ref 12.0–46.0)
Lymphs Abs: 3.4 10*3/uL (ref 0.7–4.0)
MCHC: 33.7 g/dL (ref 30.0–36.0)
MCV: 94.4 fl (ref 78.0–100.0)
Monocytes Absolute: 0.6 10*3/uL (ref 0.1–1.0)
Monocytes Relative: 5.1 % (ref 3.0–12.0)
Neutro Abs: 7.4 10*3/uL (ref 1.4–7.7)
Neutrophils Relative %: 62.7 % (ref 43.0–77.0)
Platelets: 244 10*3/uL (ref 150.0–400.0)
RBC: 4.58 Mil/uL (ref 3.87–5.11)
RDW: 13.9 % (ref 11.5–15.5)
WBC: 11.8 10*3/uL — ABNORMAL HIGH (ref 4.0–10.5)

## 2020-08-03 LAB — COMPREHENSIVE METABOLIC PANEL
ALT: 13 U/L (ref 0–35)
AST: 11 U/L (ref 0–37)
Albumin: 4.3 g/dL (ref 3.5–5.2)
Alkaline Phosphatase: 80 U/L (ref 39–117)
BUN: 8 mg/dL (ref 6–23)
CO2: 28 mEq/L (ref 19–32)
Calcium: 9.6 mg/dL (ref 8.4–10.5)
Chloride: 105 mEq/L (ref 96–112)
Creatinine, Ser: 0.81 mg/dL (ref 0.40–1.20)
GFR: 78.53 mL/min (ref >60.00)
Glucose, Bld: 111 mg/dL — ABNORMAL HIGH (ref 70–99)
Potassium: 4.6 mEq/L (ref 3.5–5.1)
Sodium: 142 mEq/L (ref 135–145)
Total Bilirubin: 0.5 mg/dL (ref 0.2–1.2)
Total Protein: 6.8 g/dL (ref 6.0–8.3)

## 2020-08-03 LAB — C-REACTIVE PROTEIN: CRP: <1 mg/dL (ref 0.5–20.0)

## 2020-08-03 MED ORDER — DICYCLOMINE HCL 10 MG PO CAPS: 10.0000 mg | ORAL_CAPSULE | Freq: Three times a day (TID) | ORAL | 1 refills | Status: DC | PRN

## 2020-08-03 NOTE — Patient Instructions (Addendum)
If you are age 61 or younger, your body mass index should be between 19-25. Your Body mass index is 29.76 kg/m. If this is out of the aformentioned range listed, please consider follow up with your Primary Care Provider.   LABS:  Lab work has been ordered for you today. Our lab is located in the basement. Press "B" on the elevator. The lab is located at the first door on the left as you exit the elevator.  HEALTHCARE LAWS AND MY CHART RESULTS: Due to recent changes in healthcare laws, you may see the results of your imaging and laboratory studies on MyChart before your provider has had a chance to review them.   We understand that in some cases there may be results that are confusing or concerning to you. Not all laboratory results come back in the same time frame and the provider may be waiting for multiple results in order to interpret others.  Please give [place] 48 hours in order for your provider to thoroughly review all the results before contacting the office for clarification of your results.   MEDICATION: We have sent the following medication to your pharmacy for you to pick up at your convenience: Dicyclomine 10 MG tablet, take 1 tablet three times a day if needed.  Please call our office if your symptoms worsen.  It was great seeing you today! Thank you for entrusting me with your care and choosing [facility].  Noralyn Pick, CRNP

## 2020-08-04 ENCOUNTER — Other Ambulatory Visit: Payer: Medicare HMO

## 2020-08-04 DIAGNOSIS — Z86010 Personal history of colonic polyps: Secondary | ICD-10-CM | POA: Diagnosis not present

## 2020-08-04 DIAGNOSIS — R197 Diarrhea, unspecified: Secondary | ICD-10-CM

## 2020-08-04 DIAGNOSIS — R6881 Early satiety: Secondary | ICD-10-CM

## 2020-08-04 LAB — TISSUE TRANSGLUTAMINASE ABS,IGG,IGA
(tTG) Ab, IgA: <1 U/mL
(tTG) Ab, IgG: <1 U/mL

## 2020-08-04 LAB — IGA: Immunoglobulin A: 148 mg/dL (ref 70–320)

## 2020-08-05 DIAGNOSIS — R69 Illness, unspecified: Secondary | ICD-10-CM | POA: Diagnosis not present

## 2020-08-06 DIAGNOSIS — R69 Illness, unspecified: Secondary | ICD-10-CM | POA: Diagnosis not present

## 2020-08-08 LAB — GI PROFILE, STOOL, PCR

## 2020-08-10 DIAGNOSIS — Z0100 Encounter for examination of eyes and vision without abnormal findings: Secondary | ICD-10-CM | POA: Diagnosis not present

## 2020-08-10 DIAGNOSIS — H524 Presbyopia: Secondary | ICD-10-CM | POA: Diagnosis not present

## 2020-08-10 DIAGNOSIS — R69 Illness, unspecified: Secondary | ICD-10-CM | POA: Diagnosis not present

## 2020-08-11 DIAGNOSIS — R69 Illness, unspecified: Secondary | ICD-10-CM | POA: Diagnosis not present

## 2020-08-11 MED ORDER — CHOLESTYRAMINE 4 G PO PACK: 4.0000 g | PACK | Freq: Every day | ORAL | 1 refills | Status: DC

## 2020-08-18 ENCOUNTER — Telehealth: Payer: Self-pay | Admitting: Internal Medicine

## 2020-08-18 ENCOUNTER — Other Ambulatory Visit: Payer: Self-pay | Admitting: Internal Medicine

## 2020-08-18 DIAGNOSIS — R69 Illness, unspecified: Secondary | ICD-10-CM | POA: Diagnosis not present

## 2020-08-18 DIAGNOSIS — F411 Generalized anxiety disorder: Secondary | ICD-10-CM | POA: Diagnosis not present

## 2020-08-19 DIAGNOSIS — M25532 Pain in left wrist: Secondary | ICD-10-CM | POA: Diagnosis not present

## 2020-08-19 DIAGNOSIS — S52501P Unspecified fracture of the lower end of right radius, subsequent encounter for closed fracture with malunion: Secondary | ICD-10-CM | POA: Diagnosis not present

## 2020-08-19 DIAGNOSIS — G8929 Other chronic pain: Secondary | ICD-10-CM | POA: Diagnosis not present

## 2020-08-19 DIAGNOSIS — R69 Illness, unspecified: Secondary | ICD-10-CM | POA: Diagnosis not present

## 2020-08-19 NOTE — Telephone Encounter (Signed)
    Patient requesting refill, please advise on status  umeclidinium-vilanterol (ANORO ELLIPTA) 62.5-25 MCG/INH AEPB       Sig - Route: Inhale 1 puff into the lungs daily. - Inhalation   Class: Historical Med

## 2020-08-20 ENCOUNTER — Telehealth: Payer: Self-pay | Admitting: Nurse Practitioner

## 2020-08-20 DIAGNOSIS — R69 Illness, unspecified: Secondary | ICD-10-CM | POA: Diagnosis not present

## 2020-08-20 NOTE — Telephone Encounter (Signed)
Called the patient back. No answer. No voicemail.

## 2020-08-20 NOTE — Telephone Encounter (Signed)
Would need visit as this was not what she was prescribed at last visit here with Mickel Baas. I have not seen her since 2020 so I do not know if this change is appropriate.

## 2020-08-20 NOTE — Telephone Encounter (Signed)
Pt has questions regarding cholestyramine. She stated that she takes meds all day and doesn't have a 4hr window. Please give her a call. Thank you

## 2020-08-20 NOTE — Telephone Encounter (Signed)
Ok to refill? Please advise.  

## 2020-08-20 NOTE — Telephone Encounter (Signed)
Patient has been scheduled for Monday 08/24/2020 at 1:40 pm

## 2020-08-21 DIAGNOSIS — R69 Illness, unspecified: Secondary | ICD-10-CM | POA: Diagnosis not present

## 2020-08-21 NOTE — Telephone Encounter (Signed)
The patient takes medication 4 times a day. She is inflexible in the times she takes her medications. Her concern is that the cholestyramine instructions says she cannot take it within 4 hours of her other medications. Diarrhea is slightly improved on dicyclomine. Stools are still very green.

## 2020-08-21 NOTE — Telephone Encounter (Signed)
She will need to wait atleast 2 hours after taking any other meds before cholestyramine and needs to wait for 3-4 hours before she can take other meds after cholestyramine. Hooe this helps, she will need to discuss with her prescribing MD if the timing of her other medications can be adjusted. Thanks

## 2020-08-21 NOTE — Telephone Encounter (Signed)
Left the information on her voicemail. Also sent it to her through My Chart patient advise request.

## 2020-08-24 ENCOUNTER — Telehealth (INDEPENDENT_AMBULATORY_CARE_PROVIDER_SITE_OTHER): Payer: Medicare HMO | Admitting: Internal Medicine

## 2020-08-24 DIAGNOSIS — E781 Pure hyperglyceridemia: Secondary | ICD-10-CM

## 2020-08-24 DIAGNOSIS — J449 Chronic obstructive pulmonary disease, unspecified: Secondary | ICD-10-CM

## 2020-08-24 DIAGNOSIS — F17210 Nicotine dependence, cigarettes, uncomplicated: Secondary | ICD-10-CM | POA: Diagnosis not present

## 2020-08-24 DIAGNOSIS — R69 Illness, unspecified: Secondary | ICD-10-CM | POA: Diagnosis not present

## 2020-08-24 MED ORDER — ANORO ELLIPTA 62.5-25 MCG/INH IN AEPB: 1.0000 | INHALATION_SPRAY | Freq: Every day | RESPIRATORY_TRACT | 3 refills | Status: DC

## 2020-08-24 NOTE — Progress Notes (Signed)
Virtual Visit via Video Note  I connected with Crystal Duarte on 08/24/20 at  1:40 PM EDT by a video enabled telemedicine application and verified that I am speaking with the correct person using two identifiers.  The patient and the provider were at separate locations throughout the entire encounter. Patient location: home, Provider location: work   I discussed the limitations of evaluation and management by telemedicine and the availability of in person appointments. The patient expressed understanding and agreed to proceed. The patient and the provider were the only parties present for the visit unless noted in HPI below.  History of Present Illness: The patient is a 60 y.o. female with visit for COPD. Needs refill anoro which has worked well for her. Tried on trelegy and breztri and felt these did not work as well so went back to old rx for anoro. Still smoking.  Observations/Objective: Appearance: normal, breathing appears normal, casual grooming, abdomen does not appear distended, mental status is A and O times 3  Assessment and Plan: See problem oriented charting  Follow Up Instructions: see A/P  I discussed the assessment and treatment plan with the patient. The patient was provided an opportunity to ask questions and all were answered. The patient agreed with the plan and demonstrated an understanding of the instructions.   The patient was advised to call back or seek an in-person evaluation if the symptoms worsen or if the condition fails to improve as anticipated.  [name], MD

## 2020-08-25 DIAGNOSIS — M25532 Pain in left wrist: Secondary | ICD-10-CM | POA: Diagnosis not present

## 2020-08-25 DIAGNOSIS — R69 Illness, unspecified: Secondary | ICD-10-CM | POA: Diagnosis not present

## 2020-08-26 ENCOUNTER — Encounter: Payer: Self-pay | Admitting: Internal Medicine

## 2020-08-26 DIAGNOSIS — R69 Illness, unspecified: Secondary | ICD-10-CM | POA: Diagnosis not present

## 2020-08-26 NOTE — Assessment & Plan Note (Signed)
Advised to quit unable to make attempt currently.

## 2020-08-26 NOTE — Assessment & Plan Note (Signed)
Rx anoro per patient request. She is not in flare. Taking albuterol prn. Has had 3 covid-19 vaccines and advised to get 4th.

## 2020-08-27 DIAGNOSIS — F411 Generalized anxiety disorder: Secondary | ICD-10-CM | POA: Diagnosis not present

## 2020-08-27 DIAGNOSIS — R69 Illness, unspecified: Secondary | ICD-10-CM | POA: Diagnosis not present

## 2020-08-27 DIAGNOSIS — M25532 Pain in left wrist: Secondary | ICD-10-CM | POA: Diagnosis not present

## 2020-08-27 DIAGNOSIS — M67834 Other specified disorders of tendon, left wrist: Secondary | ICD-10-CM | POA: Diagnosis not present

## 2020-08-28 DIAGNOSIS — R69 Illness, unspecified: Secondary | ICD-10-CM | POA: Diagnosis not present

## 2020-09-01 DIAGNOSIS — R69 Illness, unspecified: Secondary | ICD-10-CM | POA: Diagnosis not present

## 2020-09-03 ENCOUNTER — Telehealth: Payer: Self-pay | Admitting: Nurse Practitioner

## 2020-09-03 DIAGNOSIS — F411 Generalized anxiety disorder: Secondary | ICD-10-CM | POA: Diagnosis not present

## 2020-09-03 DIAGNOSIS — R69 Illness, unspecified: Secondary | ICD-10-CM | POA: Diagnosis not present

## 2020-09-03 MED ORDER — DICYCLOMINE HCL 10 MG PO CAPS: 10.0000 mg | ORAL_CAPSULE | Freq: Three times a day (TID) | ORAL | 1 refills | Status: DC | PRN

## 2020-09-03 NOTE — Telephone Encounter (Signed)
RX sent

## 2020-09-03 NOTE — Telephone Encounter (Signed)
Inbound call from patient requesting refill for dicyclmine 10mg  to [facility] [address].

## 2020-09-04 DIAGNOSIS — R69 Illness, unspecified: Secondary | ICD-10-CM | POA: Diagnosis not present

## 2020-09-07 DIAGNOSIS — R69 Illness, unspecified: Secondary | ICD-10-CM | POA: Diagnosis not present

## 2020-09-08 DIAGNOSIS — R69 Illness, unspecified: Secondary | ICD-10-CM | POA: Diagnosis not present

## 2020-09-09 DIAGNOSIS — R69 Illness, unspecified: Secondary | ICD-10-CM | POA: Diagnosis not present

## 2020-09-10 DIAGNOSIS — F411 Generalized anxiety disorder: Secondary | ICD-10-CM | POA: Diagnosis not present

## 2020-09-10 DIAGNOSIS — R69 Illness, unspecified: Secondary | ICD-10-CM | POA: Diagnosis not present

## 2020-09-11 DIAGNOSIS — R69 Illness, unspecified: Secondary | ICD-10-CM | POA: Diagnosis not present

## 2020-09-14 ENCOUNTER — Telehealth: Payer: Self-pay | Admitting: Nurse Practitioner

## 2020-09-14 DIAGNOSIS — R69 Illness, unspecified: Secondary | ICD-10-CM | POA: Diagnosis not present

## 2020-09-14 DIAGNOSIS — F411 Generalized anxiety disorder: Secondary | ICD-10-CM | POA: Diagnosis not present

## 2020-09-15 DIAGNOSIS — L649 Androgenic alopecia, unspecified: Secondary | ICD-10-CM | POA: Diagnosis not present

## 2020-09-15 DIAGNOSIS — R69 Illness, unspecified: Secondary | ICD-10-CM | POA: Diagnosis not present

## 2020-09-15 NOTE — Telephone Encounter (Signed)
Spoke with the patient. Her symptoms did not improve or change with the addition of Questran and Bentyl, so she stopped them. She continues to feel full all the time. She feels bloated, not hungry, contines to lose weight. She does not feel nauseated. Please advise on the next step.

## 2020-09-16 ENCOUNTER — Telehealth: Payer: Self-pay | Admitting: Internal Medicine

## 2020-09-16 DIAGNOSIS — R69 Illness, unspecified: Secondary | ICD-10-CM | POA: Diagnosis not present

## 2020-09-16 DIAGNOSIS — L659 Nonscarring hair loss, unspecified: Secondary | ICD-10-CM

## 2020-09-16 DIAGNOSIS — F411 Generalized anxiety disorder: Secondary | ICD-10-CM | POA: Diagnosis not present

## 2020-09-16 DIAGNOSIS — M778 Other enthesopathies, not elsewhere classified: Secondary | ICD-10-CM | POA: Diagnosis not present

## 2020-09-16 DIAGNOSIS — S63502A Unspecified sprain of left wrist, initial encounter: Secondary | ICD-10-CM | POA: Diagnosis not present

## 2020-09-16 NOTE — Telephone Encounter (Signed)
Patient called and was wondering if lab orders for D3, thyroid and iron could be added to her existing lab orders. She said that she seen her dermatologist and is experiencing hair loss and recommended those labs be checked. Please advise   Please call patient if labs are ordered

## 2020-09-17 DIAGNOSIS — R69 Illness, unspecified: Secondary | ICD-10-CM | POA: Diagnosis not present

## 2020-09-17 NOTE — Telephone Encounter (Signed)
Called and spoke with pt to advise her that her Vitamin D3 and thyroid was checked in the fall and it was normal. Pt states that was then and not relevant to now. Pt states that she has loss more hair since then and her derm provider would like to get her vitamin d3, thyroid, and iron checked. Pt has future labs for lipid and cmp from 08/24/20 OV, she wanting to add these labs before she comes to have her blood drawn.

## 2020-09-17 NOTE — Telephone Encounter (Signed)
Vitamin D and thyroid checked in the fall and normal so I don't really have a reason to recheck and blood counts normal May so no reason to check iron.

## 2020-09-18 ENCOUNTER — Other Ambulatory Visit: Payer: Self-pay

## 2020-09-18 DIAGNOSIS — R69 Illness, unspecified: Secondary | ICD-10-CM | POA: Diagnosis not present

## 2020-09-18 DIAGNOSIS — R197 Diarrhea, unspecified: Secondary | ICD-10-CM

## 2020-09-18 NOTE — Telephone Encounter (Signed)
I am talking with our referral coordinators on the pre-certification for an imaging study. Your first opening for a double is 11/05/20 last of the schedule. As of this moment.

## 2020-09-18 NOTE — Telephone Encounter (Signed)
Dr Carlean Purl I routed this incorrectly and have delayed her care. Crystal Duarte indicated in her note that a CT a/p would be the next step. Patient has persisted with her symptoms.  Is it okay to order the CT?

## 2020-09-18 NOTE — Telephone Encounter (Signed)
Please see if it is possible to do her procedures at the hospital - by that I mean would her insurance allow it - you could ask the referral coord that also

## 2020-09-18 NOTE — Telephone Encounter (Signed)
Have entered the labs

## 2020-09-18 NOTE — Telephone Encounter (Addendum)
I was told the egd/colon at the [facility] unit is not an issue with insurance.  Insurance restricts location. She can have her CT at [facility]. They can do this for her 09/25/20. No sooner openings.   How do you want me to proceed with scheduling her procedures?

## 2020-09-18 NOTE — Telephone Encounter (Signed)
Pt called to f/u on msg below. She states that her sxs keep worsening so she would like some advise.

## 2020-09-18 NOTE — Telephone Encounter (Signed)
I need to see CT to see if she needs a double or just one proceduere so pending CT result

## 2020-09-18 NOTE — Telephone Encounter (Signed)
I spoke to the patient about this.  I had told her we were going to put her on for an EGD and a colonoscopy but tell her I looked over the chart and I do think that it makes sense for her to have a CT scan of the abdomen and pelvis first.    This is for early satiety unintentional weight loss and diarrhea  It will be with oral but no IV contrast  Please try to get it done by Monday or Tuesday of next week we could even do on the weekend if possible though pre-CERT may be an issue  Please look and tell me where the first available double EGD colonoscopy in the [place] is?

## 2020-09-21 DIAGNOSIS — R69 Illness, unspecified: Secondary | ICD-10-CM | POA: Diagnosis not present

## 2020-09-21 NOTE — Telephone Encounter (Signed)
Patient aware of this plan.

## 2020-09-21 NOTE — Telephone Encounter (Signed)
Called and spoke to pt about future labs, she verbalized understanding.

## 2020-09-22 DIAGNOSIS — M778 Other enthesopathies, not elsewhere classified: Secondary | ICD-10-CM | POA: Insufficient documentation

## 2020-09-22 DIAGNOSIS — R69 Illness, unspecified: Secondary | ICD-10-CM | POA: Diagnosis not present

## 2020-09-25 ENCOUNTER — Ambulatory Visit
Admission: RE | Admit: 2020-09-25 | Discharge: 2020-09-25 | Disposition: A | Discharge status: Home / Self Care | Payer: Medicare HMO | Source: Ambulatory Visit | Attending: Internal Medicine | Admitting: Internal Medicine

## 2020-09-25 DIAGNOSIS — R197 Diarrhea, unspecified: Secondary | ICD-10-CM

## 2020-09-25 DIAGNOSIS — R634 Abnormal weight loss: Secondary | ICD-10-CM | POA: Diagnosis not present

## 2020-09-30 DIAGNOSIS — R69 Illness, unspecified: Secondary | ICD-10-CM | POA: Diagnosis not present

## 2020-09-30 DIAGNOSIS — F411 Generalized anxiety disorder: Secondary | ICD-10-CM | POA: Diagnosis not present

## 2020-10-02 ENCOUNTER — Ambulatory Visit (AMBULATORY_SURGERY_CENTER): Payer: Medicare HMO | Admitting: *Deleted

## 2020-10-02 ENCOUNTER — Other Ambulatory Visit: Payer: Self-pay

## 2020-10-02 VITALS — Ht 64.0 in | Wt 173.0 lb

## 2020-10-02 DIAGNOSIS — R197 Diarrhea, unspecified: Secondary | ICD-10-CM

## 2020-10-02 DIAGNOSIS — R634 Abnormal weight loss: Secondary | ICD-10-CM

## 2020-10-02 DIAGNOSIS — Z86010 Personal history of colonic polyps: Secondary | ICD-10-CM

## 2020-10-02 MED ORDER — SUTAB 1479-225-188 MG PO TABS
1.0000 | ORAL_TABLET | ORAL | 0 refills | Status: DC
Start: 2020-10-02 — End: 2020-10-16

## 2020-10-02 NOTE — Progress Notes (Signed)
Patient's pre-visit was done today over the phone with the patient due to COVID-19 pandemic. Name,DOB and address verified. Insurance verified. Patient denies any allergies to Eggs and Soy. Patient denies any problems with anesthesia/sedation. Patient denies taking diet pills or blood thinners. Packet of Prep instructions mailed to patient including a copy of a consent form-pt is aware. Patient understands to call us back with any questions or concerns. Patient is aware of our care-partner policy and [id number] safety protocol.   EMMI education assigned to the patient for the procedure, sent to [place].   The patient is COVID-19 vaccinated, per patient.  Patient request sutab-pt aware of the cost with the coupon.

## 2020-10-06 ENCOUNTER — Ambulatory Visit: Payer: Medicare HMO | Admitting: Nurse Practitioner

## 2020-10-08 ENCOUNTER — Other Ambulatory Visit (INDEPENDENT_AMBULATORY_CARE_PROVIDER_SITE_OTHER): Payer: Medicare HMO

## 2020-10-08 DIAGNOSIS — L659 Nonscarring hair loss, unspecified: Secondary | ICD-10-CM

## 2020-10-08 DIAGNOSIS — E781 Pure hyperglyceridemia: Secondary | ICD-10-CM | POA: Diagnosis not present

## 2020-10-08 LAB — COMPREHENSIVE METABOLIC PANEL
ALT: 14 U/L (ref 0–35)
AST: 15 U/L (ref 0–37)
Albumin: 4.2 g/dL (ref 3.5–5.2)
Alkaline Phosphatase: 109 U/L (ref 39–117)
BUN: 14 mg/dL (ref 6–23)
CO2: 28 mEq/L (ref 19–32)
Calcium: 9.6 mg/dL (ref 8.4–10.5)
Chloride: 105 mEq/L (ref 96–112)
Creatinine, Ser: 1.07 mg/dL (ref 0.40–1.20)
GFR: 56.15 mL/min — ABNORMAL LOW (ref >60.00)
Glucose, Bld: 122 mg/dL — ABNORMAL HIGH (ref 70–99)
Potassium: 4.3 mEq/L (ref 3.5–5.1)
Sodium: 144 mEq/L (ref 135–145)
Total Bilirubin: 0.3 mg/dL (ref 0.2–1.2)
Total Protein: 6.8 g/dL (ref 6.0–8.3)

## 2020-10-08 LAB — FERRITIN: Ferritin: 54.3 ng/mL (ref 10.0–291.0)

## 2020-10-08 LAB — VITAMIN D 25 HYDROXY (VIT D DEFICIENCY, FRACTURES): VITD: 27.45 ng/mL — ABNORMAL LOW (ref 30.00–100.00)

## 2020-10-08 LAB — LIPID PANEL
Cholesterol: 217 mg/dL — ABNORMAL HIGH (ref 0–200)
HDL: 55.3 mg/dL (ref >39.00)
NonHDL: 161.53
Total CHOL/HDL Ratio: 4
Triglycerides: 231 mg/dL — ABNORMAL HIGH (ref 0.0–149.0)
VLDL: 46.2 mg/dL — ABNORMAL HIGH (ref 0.0–40.0)

## 2020-10-08 LAB — VITAMIN B12: Vitamin B-12: 460 pg/mL (ref 211–911)

## 2020-10-08 LAB — TSH: TSH: 2.41 u[IU]/mL (ref 0.35–5.50)

## 2020-10-08 LAB — LDL CHOLESTEROL, DIRECT: Direct LDL: 120 mg/dL

## 2020-10-09 ENCOUNTER — Encounter: Payer: Self-pay | Admitting: Internal Medicine

## 2020-10-12 ENCOUNTER — Telehealth: Payer: Self-pay | Admitting: Internal Medicine

## 2020-10-15 DIAGNOSIS — F411 Generalized anxiety disorder: Secondary | ICD-10-CM | POA: Diagnosis not present

## 2020-10-15 DIAGNOSIS — R69 Illness, unspecified: Secondary | ICD-10-CM | POA: Diagnosis not present

## 2020-10-16 ENCOUNTER — Other Ambulatory Visit: Payer: Self-pay

## 2020-10-16 ENCOUNTER — Encounter: Payer: Self-pay | Admitting: Gastroenterology

## 2020-10-16 ENCOUNTER — Ambulatory Visit (AMBULATORY_SURGERY_CENTER): Payer: Medicare HMO | Admitting: Gastroenterology

## 2020-10-16 ENCOUNTER — Other Ambulatory Visit (INDEPENDENT_AMBULATORY_CARE_PROVIDER_SITE_OTHER): Payer: Medicare HMO

## 2020-10-16 ENCOUNTER — Encounter: Payer: Self-pay | Admitting: Internal Medicine

## 2020-10-16 ENCOUNTER — Telehealth (INDEPENDENT_AMBULATORY_CARE_PROVIDER_SITE_OTHER): Payer: Medicare HMO | Admitting: Internal Medicine

## 2020-10-16 VITALS — BP 154/88 | HR 73 | Temp 97.7°F | Resp 22 | Ht 64.0 in | Wt 173.0 lb

## 2020-10-16 DIAGNOSIS — K319 Disease of stomach and duodenum, unspecified: Secondary | ICD-10-CM | POA: Diagnosis not present

## 2020-10-16 DIAGNOSIS — R197 Diarrhea, unspecified: Secondary | ICD-10-CM

## 2020-10-16 DIAGNOSIS — K2970 Gastritis, unspecified, without bleeding: Secondary | ICD-10-CM | POA: Diagnosis not present

## 2020-10-16 DIAGNOSIS — R739 Hyperglycemia, unspecified: Secondary | ICD-10-CM

## 2020-10-16 DIAGNOSIS — I739 Peripheral vascular disease, unspecified: Secondary | ICD-10-CM | POA: Diagnosis not present

## 2020-10-16 DIAGNOSIS — E781 Pure hyperglyceridemia: Secondary | ICD-10-CM | POA: Diagnosis not present

## 2020-10-16 DIAGNOSIS — R69 Illness, unspecified: Secondary | ICD-10-CM | POA: Diagnosis not present

## 2020-10-16 DIAGNOSIS — F17210 Nicotine dependence, cigarettes, uncomplicated: Secondary | ICD-10-CM

## 2020-10-16 DIAGNOSIS — R634 Abnormal weight loss: Secondary | ICD-10-CM | POA: Diagnosis not present

## 2020-10-16 DIAGNOSIS — K2100 Gastro-esophageal reflux disease with esophagitis, without bleeding: Secondary | ICD-10-CM

## 2020-10-16 DIAGNOSIS — Z1211 Encounter for screening for malignant neoplasm of colon: Secondary | ICD-10-CM | POA: Diagnosis not present

## 2020-10-16 DIAGNOSIS — K529 Noninfective gastroenteritis and colitis, unspecified: Secondary | ICD-10-CM | POA: Diagnosis not present

## 2020-10-16 DIAGNOSIS — R7309 Other abnormal glucose: Secondary | ICD-10-CM

## 2020-10-16 DIAGNOSIS — K635 Polyp of colon: Secondary | ICD-10-CM | POA: Diagnosis not present

## 2020-10-16 DIAGNOSIS — K2980 Duodenitis without bleeding: Secondary | ICD-10-CM | POA: Diagnosis not present

## 2020-10-16 DIAGNOSIS — Z86010 Personal history of colonic polyps: Secondary | ICD-10-CM

## 2020-10-16 DIAGNOSIS — D125 Benign neoplasm of sigmoid colon: Secondary | ICD-10-CM

## 2020-10-16 DIAGNOSIS — D123 Benign neoplasm of transverse colon: Secondary | ICD-10-CM

## 2020-10-16 LAB — HEMOGLOBIN A1C: Hgb A1c MFr Bld: 5.9 % (ref 4.6–6.5)

## 2020-10-16 MED ORDER — OMEPRAZOLE 20 MG PO CPDR: 20.0000 mg | DELAYED_RELEASE_CAPSULE | Freq: Every day | ORAL | 0 refills | Status: DC

## 2020-10-16 MED ORDER — SODIUM CHLORIDE 0.9 % IV SOLN: 500.0000 mL | Freq: Once | INTRAVENOUS | Status: DC

## 2020-10-16 MED ORDER — ROSUVASTATIN CALCIUM 10 MG PO TABS: 10.0000 mg | ORAL_TABLET | Freq: Every day | ORAL | 3 refills | Status: DC

## 2020-10-16 NOTE — Patient Instructions (Addendum)
Begin Omeprazole 20 mg daily - sent to [facility] for pick up  Handout on polyps given to you today  Await pathology results on colon polyps removed and on biopsies of colon  Await pathology results on biopsies of duodenum and stomach     YOU HAD AN ENDOSCOPIC PROCEDURE TODAY AT [facility]:   Refer to the procedure report that was given to you for any specific questions about what was found during the examination.  If the procedure report does not answer your questions, please call your gastroenterologist to clarify.  If you requested that your care partner not be given the details of your procedure findings, then the procedure report has been included in a sealed envelope for you to review at your convenience later.  YOU SHOULD EXPECT: Some feelings of bloating in the abdomen. Passage of more gas than usual.  Walking can help get rid of the air that was put into your GI tract during the procedure and reduce the bloating. If you had a lower endoscopy (such as a colonoscopy or flexible sigmoidoscopy) you may notice spotting of blood in your stool or on the toilet paper. If you underwent a bowel prep for your procedure, you may not have a normal bowel movement for a few days.  Please Note:  You might notice some irritation and congestion in your nose or some drainage.  This is from the oxygen used during your procedure.  There is no need for concern and it should clear up in a day or so.  SYMPTOMS TO REPORT IMMEDIATELY:  Following lower endoscopy (colonoscopy or flexible sigmoidoscopy):  Excessive amounts of blood in the stool  Significant tenderness or worsening of abdominal pains  Swelling of the abdomen that is new, acute  Fever of 100F or higher  Following upper endoscopy (EGD)  Vomiting of blood or coffee ground material  New chest pain or pain under the shoulder blades  Painful or persistently difficult swallowing  New shortness of breath  Fever of 100F  or higher  Black, tarry-looking stools  For urgent or emergent issues, a gastroenterologist can be reached at any hour by calling [phone]. Do not use MyChart messaging for urgent concerns.    DIET:  We do recommend a small meal at first, but then you may proceed to your regular diet.  Drink plenty of fluids but you should avoid alcoholic beverages for 24 hours.  ACTIVITY:  You should plan to take it easy for the rest of today and you should NOT DRIVE or use heavy machinery until tomorrow (because of the sedation medicines used during the test).    FOLLOW UP: Our staff will call the number listed on your records 48-72 hours following your procedure to check on you and address any questions or concerns that you may have regarding the information given to you following your procedure. If we do not reach you, we will leave a message.  We will attempt to reach you two times.  During this call, we will ask if you have developed any symptoms of COVID 19. If you develop any symptoms (ie: fever, flu-like symptoms, shortness of breath, cough etc.) before then, please call [phone].  If you test positive for Covid 19 in the 2 weeks post procedure, please call and report this information to [place].    If any biopsies were taken you will be contacted by phone or by letter within the next 1-3 weeks.  Please call us  at [phone] if you have not heard about the biopsies in 3 weeks.    SIGNATURES/CONFIDENTIALITY: You and/or your care partner have signed paperwork which will be entered into your electronic medical record.  These signatures attest to the fact that that the information above on your After Visit Summary has been reviewed and is understood.  Full responsibility of the confidentiality of this discharge information lies with you and/or your care-partner.

## 2020-10-16 NOTE — Progress Notes (Signed)
Called to room to assist during endoscopic procedure.  Patient ID and intended procedure confirmed with present staff. Received instructions for my participation in the procedure from the performing physician.  

## 2020-10-16 NOTE — Assessment & Plan Note (Signed)
Strong family history of DM so checking HgA1c.

## 2020-10-16 NOTE — Assessment & Plan Note (Signed)
This raises her risk for CAD and not willing to make quit attempt currently.

## 2020-10-16 NOTE — Op Note (Signed)
Crystal Duarte: Crystal Duarte Procedure Date: 10/16/2020 3:02 PM MRN: [id number] Endoscopist: Nicki Reaper E. Candis Schatz , MD Age: 60 Referring MD:  Date of Birth: September 28, 1960 Gender: Female Account #: [id number] Procedure:                Upper GI endoscopy Indications:              Diarrhea, Weight loss Medicines:                Monitored Anesthesia Care Procedure:                Pre-Anesthesia Assessment:                           - Prior to the procedure, a History and Physical                            was performed, and patient medications and                            allergies were reviewed. The patient's tolerance of                            previous anesthesia was also reviewed. The risks                            and benefits of the procedure and the sedation                            options and risks were discussed with the patient.                            All questions were answered, and informed consent                            was obtained. Prior Anticoagulants: The patient has                            taken no previous anticoagulant or antiplatelet                            agents. ASA Grade Assessment: II - A patient with                            mild systemic disease. After reviewing the risks                            and benefits, the patient was deemed in                            satisfactory condition to undergo the procedure.                           After obtaining informed consent, the endoscope was  passed under direct vision. Throughout the                            procedure, the patient's blood pressure, pulse, and                            oxygen saturations were monitored continuously. The                            GIF [id number] #[id number] was introduced through the                            mouth, and advanced to the second part of duodenum.                            The upper GI endoscopy was  accomplished without                            difficulty. The patient tolerated the procedure                            well. Scope In: Scope Out: Findings:                 The examined portions of the nasopharynx,                            oropharynx and larynx were normal.                           LA Grade A (one or more mucosal breaks less than 5                            mm, not extending between tops of 2 mucosal folds)                            esophagitis was found.                           The exam of the esophagus was otherwise normal.                           Diffuse mildly erythematous mucosa was found in the                            gastric antrum. Biopsies were taken with a cold                            forceps for Helicobacter pylori testing. Estimated                            blood loss was minimal.                           Diffuse granular mucosa was found in  the gastric                            body. Biopsies were taken with a cold forceps for                            histology. Estimated blood loss was minimal.                           Hematin (altered blood/coffee-ground-like material)                            was found in the gastric body and in the gastric                            antrum.                           A few diffuse, diminutive erosions without bleeding                            were found in the duodenal bulb and in the second                            portion of the duodenum. Biopsies for histology                            were taken with a cold forceps for evaluation of                            celiac disease. Biopsies for histology were taken                            with a cold forceps for evaluation of celiac                            disease. Estimated blood loss was minimal.                           The exam of the duodenum was otherwise normal. Complications:            No immediate complications. Estimated  Blood Loss:     Estimated blood loss was minimal. Impression:               - The examined portions of the nasopharynx,                            oropharynx and larynx were normal.                           - LA Grade A esophagitis.                           - Erythematous mucosa in the antrum. Biopsied.                           -  Granular gastric mucosa. Biopsied.                           - Hematin (altered blood/coffee-ground-like                            material) in the gastric antrum and in the gastric                            body.                           - Erosive duodenopathy without bleeding. Biopsied.                           - No abnormalities to explain patient's weight loss                            and diarrhea. Recommendation:           - Patient has a contact number available for                            emergencies. The signs and symptoms of potential                            delayed complications were discussed with the                            patient. Return to normal activities tomorrow.                            Written discharge instructions were provided to the                            patient.                           - Resume previous diet.                           - Continue present medications.                           - Use Prilosec (omeprazole) 20 mg PO daily for 8                            weeks to heal esophagitis and duodenal erosions.                           - Await pathology results.                           - Return to GI clinic at appointment to be                            scheduled. Maribella Kuna E. Candis Schatz,  MD 10/16/2020 4:21:30 PM This report has been signed electronically.

## 2020-10-16 NOTE — Progress Notes (Signed)
A and O x3. Report to RN. Tolerated MAC anesthesia well.Teeth unchanged after procedure. 

## 2020-10-16 NOTE — Assessment & Plan Note (Signed)
Refer to cardiology to be seen for risk assessment given her high risk of CAD. Current smoker, strong family hx CAD.

## 2020-10-16 NOTE — Assessment & Plan Note (Signed)
Refill crestor 10 mg daily as her cholesterol levels are increased off this. She did not have side effect from it.

## 2020-10-16 NOTE — Progress Notes (Signed)
Virtual Visit via Video Note  I connected with Crystal Duarte on 10/16/20 at 10:20 AM EDT by a video enabled telemedicine application and verified that I am speaking with the correct person using two identifiers.  The patient and the provider were at separate locations throughout the entire encounter. Patient location: home, Provider location: work   I discussed the limitations of evaluation and management by telemedicine and the availability of in person appointments. The patient expressed understanding and agreed to proceed. The patient and the provider were the only parties present for the visit unless noted in HPI below.  History of Present Illness: The patient is a 60 y.o. female with visit for discussion about labs. Chronic diarrhea and losing weight. Going for colonoscopy and endoscopy today to help figure that out. Would be interested in going back on crestor for cholesterol and wants to see cardiology again just for screening given her high risk for CAD.   Observations/Objective: Appearance: normal, breathing appears normal, casual grooming, abdomen not visualized, mental status is A and O times 3  Assessment and Plan: See problem oriented charting  Follow Up Instructions: see A/P  I discussed the assessment and treatment plan with the patient. The patient was provided an opportunity to ask questions and all were answered. The patient agreed with the plan and demonstrated an understanding of the instructions.   The patient was advised to call back or seek an in-person evaluation if the symptoms worsen or if the condition fails to improve as anticipated.  [name], MD

## 2020-10-16 NOTE — Progress Notes (Signed)
Pt's states no medical or surgical changes since previsit or office visit.   VS taken by [facility]

## 2020-10-16 NOTE — Op Note (Signed)
[facility] Patient Name: Crystal Duarte Procedure Date: 10/16/2020 3:02 PM MRN: [id number] Endoscopist: Nicki Reaper E. Candis Schatz , MD Age: 60 Referring MD:  Date of Birth: 09/23/1960 Gender: Female Account #: [id number] Procedure:                Colonoscopy Indications:              Chronic diarrhea, Weight loss Medicines:                Monitored Anesthesia Care Procedure:                Pre-Anesthesia Assessment:                           - Prior to the procedure, a History and Physical                            was performed, and patient medications and                            allergies were reviewed. The patient's tolerance of                            previous anesthesia was also reviewed. The risks                            and benefits of the procedure and the sedation                            options and risks were discussed with the patient.                            All questions were answered, and informed consent                            was obtained. Prior Anticoagulants: The patient has                            taken no previous anticoagulant or antiplatelet                            agents. ASA Grade Assessment: II - A patient with                            mild systemic disease. After reviewing the risks                            and benefits, the patient was deemed in                            satisfactory condition to undergo the procedure.                           After obtaining informed consent, the colonoscope  was passed under direct vision. Throughout the                            procedure, the patient's blood pressure, pulse, and                            oxygen saturations were monitored continuously. The                            CF [id number] #[id number] was introduced through the anus                            and advanced to the the terminal ileum, with                            identification of the  appendiceal orifice and IC                            valve. The colonoscopy was performed without                            difficulty. The patient tolerated the procedure                            well. The quality of the bowel preparation was                            adequate. The terminal ileum, ileocecal valve,                            appendiceal orifice, and rectum were photographed. Scope In: 3:29:32 PM Scope Out: 3:55:44 PM Scope Withdrawal Time: 0 hours 20 minutes 11 seconds  Total Procedure Duration: 0 hours 26 minutes 12 seconds  Findings:                 Hemorrhoids were found on perianal exam.                           A 3 mm polyp was found in the splenic flexure. The                            polyp was flat. The polyp was removed with a jumbo                            cold forceps. Resection and retrieval were                            complete. Estimated blood loss was minimal.                           A 6 mm polyp was found in the sigmoid colon. The  polyp was sessile. The polyp was removed with a                            cold snare. Resection and retrieval were complete.                            Estimated blood loss was minimal.                           Normal mucosa was found in the entire colon.                            Biopsies for histology were taken with a cold                            forceps from the ascending colon, transverse colon,                            descending colon and sigmoid colon for evaluation                            of microscopic colitis. Estimated blood loss was                            minimal.                           The terminal ileum appeared normal.                           The retroflexed view of the distal rectum and anal                            verge was normal and showed no anal or rectal                            abnormalities. Complications:            No immediate  complications. Estimated Blood Loss:     Estimated blood loss was minimal. Impression:               - Hemorrhoids found on perianal exam.                           - One 3 mm polyp at the splenic flexure, removed                            with a jumbo cold forceps. Resected and retrieved.                           - One 6 mm polyp in the sigmoid colon, removed with                            a cold snare. Resected and retrieved.                           -  Normal mucosa in the entire examined colon.                            Biopsied.                           - The examined portion of the ileum was normal.                           - The distal rectum and anal verge are normal on                            retroflexion view.                           - No abnormalities to explain patient's symptoms of                            diarrhea and weight loss. Recommendation:           - Patient has a contact number available for                            emergencies. The signs and symptoms of potential                            delayed complications were discussed with the                            patient. Return to normal activities tomorrow.                            Written discharge instructions were provided to the                            patient.                           - Resume previous diet.                           - Continue present medications.                           - Await pathology results.                           - Repeat colonoscopy in 7 years for surveillance.                           - Return to GI clinic at appointment to be                            scheduled. Hakeen Shipes E. Candis Schatz, MD 10/16/2020 4:26:58 PM This report has been signed electronically.

## 2020-10-20 ENCOUNTER — Telehealth: Payer: Self-pay | Admitting: *Deleted

## 2020-10-20 NOTE — Telephone Encounter (Signed)
  Follow up Call-  Call back number 10/16/2020  Post procedure Call Back phone  # [phone]  Permission to leave phone message Yes  Some recent data might be hidden     Patient questions:  Message left to call us if necessary.

## 2020-10-20 NOTE — Telephone Encounter (Signed)
  Follow up Call-  Call back number 10/16/2020  Post procedure Call Back phone  # [phone]  Permission to leave phone message Yes  Some recent data might be hidden     Patient questions:  Do you have a fever, pain , or abdominal swelling? No. Pain Score  0 *  Have you tolerated food without any problems? Yes.    Have you been able to return to your normal activities? Yes.    Do you have any questions about your discharge instructions: Diet   No. Medications  No. Follow up visit  No.  Do you have questions or concerns about your Care? No.  Actions: * If pain score is 4 or above: No action needed, pain <4.  Have you developed a fever since your procedure? no  2.   Have you had an respiratory symptoms (SOB or cough) since your procedure? no  3.   Have you tested positive for COVID 19 since your procedure no  4.   Have you had any family members/close contacts diagnosed with the COVID 19 since your procedure?  no   If yes to any of these questions please route to Joylene John, RN and Joella Prince, RN

## 2020-10-29 DIAGNOSIS — F411 Generalized anxiety disorder: Secondary | ICD-10-CM | POA: Diagnosis not present

## 2020-10-29 DIAGNOSIS — R69 Illness, unspecified: Secondary | ICD-10-CM | POA: Diagnosis not present

## 2020-11-04 ENCOUNTER — Ambulatory Visit: Payer: Medicare HMO | Admitting: Internal Medicine

## 2020-11-04 ENCOUNTER — Other Ambulatory Visit: Payer: Self-pay

## 2020-11-04 ENCOUNTER — Encounter: Payer: Self-pay | Admitting: Internal Medicine

## 2020-11-04 VITALS — BP 120/77 | HR 80 | Ht 65.0 in | Wt 175.0 lb

## 2020-11-04 DIAGNOSIS — J449 Chronic obstructive pulmonary disease, unspecified: Secondary | ICD-10-CM | POA: Diagnosis not present

## 2020-11-04 DIAGNOSIS — R0602 Shortness of breath: Secondary | ICD-10-CM

## 2020-11-04 DIAGNOSIS — E781 Pure hyperglyceridemia: Secondary | ICD-10-CM | POA: Diagnosis not present

## 2020-11-04 DIAGNOSIS — R0600 Dyspnea, unspecified: Secondary | ICD-10-CM | POA: Diagnosis not present

## 2020-11-04 DIAGNOSIS — Z8249 Family history of ischemic heart disease and other diseases of the circulatory system: Secondary | ICD-10-CM

## 2020-11-04 DIAGNOSIS — R69 Illness, unspecified: Secondary | ICD-10-CM | POA: Diagnosis not present

## 2020-11-04 DIAGNOSIS — I700 Atherosclerosis of aorta: Secondary | ICD-10-CM | POA: Diagnosis not present

## 2020-11-04 DIAGNOSIS — R0609 Other forms of dyspnea: Secondary | ICD-10-CM

## 2020-11-04 DIAGNOSIS — F17210 Nicotine dependence, cigarettes, uncomplicated: Secondary | ICD-10-CM

## 2020-11-04 MED ORDER — ROSUVASTATIN CALCIUM 20 MG PO TABS: 20.0000 mg | ORAL_TABLET | Freq: Every day | ORAL | 3 refills | Status: DC

## 2020-11-04 NOTE — Patient Instructions (Addendum)
Medication Instructions:  Your physician has recommended you make the following change in your medication:  INCREASE: rosuvastatin (Crestor) to 20 mg by mouth daily  *If you need a refill on your cardiac medications before your next appointment, please call your pharmacy*   Lab Work: IN 3 Months: FLP, LFT If you have labs (blood work) drawn today and your tests are completely normal, you will receive your results only by: [place] (if you have MyChart) OR A paper copy in the mail If you have any lab test that is abnormal or we need to change your treatment, we will call you to review the results.   Testing/Procedures: Your physician has requested that you have en exercise stress myoview. For further information please visit [email or web address]. Please follow instruction sheet, as given.    Follow-Up: At [facility], you and your health needs are our priority.  As part of our continuing mission to provide you with exceptional heart care, we have created designated Provider Care Teams.  These Care Teams include your primary Cardiologist (physician) and Advanced Practice Providers (APPs -  Physician Assistants and Nurse Practitioners) who all work together to provide you with the care you need, when you need it.  We recommend signing up for the patient portal called "MyChart".  Sign up information is provided on this After Visit Summary.  MyChart is used to connect with patients for Virtual Visits (Telemedicine).  Patients are able to view lab/test results, encounter notes, upcoming appointments, etc.  Non-urgent messages can be sent to your provider as well.   To learn more about what you can do with MyChart, go to [email or web address].    Your next appointment:   3 month(s)  The format for your next appointment:   In Person  Provider:   You may see Mahesh Chandrasekhar,MD or one of the following Advanced Practice Providers on your designated Care Team:   Melina Copa,  PA-C Ermalinda Barrios, PA-C   Other Instructions  You are scheduled for a Myocardial Perfusion Imaging Study. Please arrive 15 minutes prior to your appointment time for registration and insurance purposes.   The test will take approximately 3 to 4 hours to complete; you may bring reading material.  If someone comes with you to your appointment, they will need to remain in the main lobby due to limited space in the testing area. **If you are pregnant or breastfeeding, please notify the nuclear lab prior to your appointment**   How to prepare for your Myocardial Perfusion Test: Do not eat or drink 3 hours prior to your test, except you may have water. Do not consume products containing caffeine (regular or decaffeinated) 12 hours prior to your test. (ex: coffee, chocolate, sodas, tea). Do bring a list of your current medications with you.  If not listed below, you may take your medications as normal.  DO NOT TAKE PROPRANOLO 24 HOURS PRIOR TO TEST Do wear comfortable clothes (no dresses or overalls) and walking shoes, tennis shoes preferred (No heels or open toe shoes are allowed). Do NOT wear cologne, perfume, aftershave, or lotions (deodorant is allowed). If these instructions are not followed, your test will have to be rescheduled.  If you cannot keep your appointment, please provide 24 hours notification to the Nuclear Lab, to avoid a possible $50 charge to your account.

## 2020-11-04 NOTE — Progress Notes (Signed)
. Cardiology Office Note:    Date:  11/04/2020   ID:  Crystal Duarte, DOB 1960-05-31, MRN [id number]  PCP:  [name], MD  [facility] Cardiologist:  None  [facility] Electrophysiologist:  None   Referring MD: [name], *   CC: Elavated Triglycerides  History of Present Illness:    Crystal Duarte is a 60 y.o. female with a hx of Pulmonary Coccidomycosis in 1996, COPD, Tobacco Abuse, Peripheral Arterial Disease who presents with SOB.  Since Winter 2022  has worsening SOB.  Seen 11/04/20  Patient notes that she is doing about the same.  Since last visit notes elevated cholesterol despite rosuvastatin 10 mg PO daily changes.  Relevant interval testing or therapy include working through some stomach issues and s/p EGD and colonscopy.  There are no interval hospital/ED visit.    No chest pain or pressure.  Notes SOB with exertion and playing with her grandchildren and no PND/Orthopnea.  No weight gain or leg swelling.  No palpitations or syncope.  Working to stop smoking.   Past Medical History:  Diagnosis Date   Coccidioidomycosis, pulmonary ([place])    Depression    Hx of adenomatous polyp of colon 12/04/2014   Major depressive disorder    since 14, electroconvulsive therapy, and transcranial magnetic stimulation recently for 6 weeks every day    Osteopenia 01/2019   T score -2.1 FRAX 9.6% / 2.1% stable from prior DEXA   Panic attack    PONV (postoperative nausea and vomiting)    VAIN I (vaginal intraepithelial neoplasia grade I) 2015   Positive HPV negative subtype 16, 18/45    Past Surgical History:  Procedure Laterality Date   ABDOMINAL HYSTERECTOMY     TAH BSO   BILATERAL VATS ABLATION     vats for biopsy due to infection   BREAST EXCISIONAL BIOPSY Right    benign   COLONOSCOPY  2001   hemorrhoidectomy   HEMORRHOID SURGERY     LUNG SURGERY  2001   VATS surgery    OVARIAN CYST REMOVAL  1970's   ULNAR NERVE TRANSPOSITION Left 12/06/2018    Procedure: LEFT ULNAR NERVE DECOMPRESSION,  ;  Surgeon: Leanora Cover, MD;  Location: [place];  Service: Orthopedics;  Laterality: Left;   VESICOVAGINAL FISTULA CLOSURE W/ TAH  2005   WRIST ARTHROSCOPY Left 03/13/2018   Procedure: ARTHROSCOPY LEFT WRIST WITH DEBRIDEMENT;  Surgeon: Leanora Cover, MD;  Location: [place];  Service: Orthopedics;  Laterality: Left;  block   Current Medications: Current Meds  Medication Sig   busPIRone (BUSPAR) 10 MG tablet 20 mg 3 (three) times daily.    gabapentin (NEURONTIN) 400 MG capsule Take 400 mg by mouth 2 (two) times daily.   gabapentin (NEURONTIN) 600 MG tablet Take 600 mg by mouth daily.    Multiple Vitamin (MULTI-VITAMINS) TABS Take 1 tablet by mouth daily.    omeprazole (PRILOSEC) 20 MG capsule Take 1 capsule (20 mg total) by mouth daily.   propranolol (INDERAL) 20 MG tablet Take 1 tablet (20 mg total) by mouth 3 (three) times daily.   rosuvastatin (CRESTOR) 20 MG tablet Take 1 tablet (20 mg total) by mouth daily.   traZODone (DESYREL) 100 MG tablet Take 250 mg by mouth at bedtime.   umeclidinium-vilanterol (ANORO ELLIPTA) 62.5-25 MCG/INH AEPB Inhale 1 puff into the lungs daily.   venlafaxine XR (EFFEXOR-XR) 150 MG 24 hr capsule Take 150 mg by mouth daily with breakfast.  venlafaxine XR (EFFEXOR-XR) 37.5 MG 24 hr capsule Take 37.5 mg by mouth daily.   [DISCONTINUED] rosuvastatin (CRESTOR) 10 MG tablet Take 1 tablet (10 mg total) by mouth daily.    Allergies:   Other, Procaine, Latuda [lurasidone hcl], and Sulfa antibiotics   Social History   Socioeconomic History   Marital status: Single    Spouse name: Not on file   Number of children: 2   Years of education: Not on file   Highest education level: Not on file  Occupational History   Occupation: Herbalist    Comment: disability    Employer: [employer]  Tobacco Use   Smoking status: Some Days    Packs/day: 0.25    Years: 42.00    Pack  years: 10.50    Types: Cigarettes   Smokeless tobacco: Never  Vaping Use   Vaping Use: Never used  Substance and Sexual Activity   Alcohol use: No    Alcohol/week: 0.0 standard drinks   Drug use: No   Sexual activity: Not Currently    Birth control/protection: Surgical    Comment: 1st intercourse 60 yo-More than 5 partners  Other Topics Concern   Not on file  Social History Narrative   Lives with son, daughter in Sports coach and 4 grandchildren    Social Determinants of Health   Financial Resource Strain: Not on file  Food Insecurity: Not on file  Transportation Needs: Not on file  Physical Activity: Not on file  Stress: Not on file  Social Connections: Not on file   Family History: The patient's family history includes Asthma in her mother; Colon polyps in her father and mother; Heart disease in her father; Lung cancer in her maternal grandmother; Ovarian cancer in her mother; Peripheral vascular disease in her father; Varicose Veins in her mother. There is no history of Colon cancer, Esophageal cancer, Stomach cancer, Pancreatic cancer, or Liver disease. Father had CABG at age 53  ROS:   Please see the history of present illness.    All other systems reviewed and are negative.  EKGs/Labs/Other Studies Reviewed:    The following studies were reviewed today:  EKG:  EKG is  ordered today.  The ekg ordered today demonstrates sinus rhythm rate 73 no ST changes or TWI 11/03/2017 EKG: Sinus Tachycardia 109 no ST/T wave abnormalities   Transthoracic Echocardiogram: Date: 03/30/20 Results: 1. Left ventricular ejection fraction, by estimation, is 60 to 65%. The  left ventricle has normal function. The left ventricle has no regional  wall motion abnormalities. Left ventricular diastolic parameters were  normal.   2. Right ventricular systolic function is normal. The right ventricular  size is normal. Tricuspid regurgitation signal is inadequate for assessing  PA pressure.   3. The  mitral valve is normal in structure. No evidence of mitral valve  regurgitation. No evidence of mitral stenosis.   4. The aortic valve is tricuspid. Aortic valve regurgitation is not  visualized. No aortic stenosis is present.   Cardiac CT Date: 04/06/20 Results: Small aortic atherosclerosis  Recent Labs: 03/02/2020: NT-Pro BNP 57 08/03/2020: Hemoglobin 14.6; Platelets 244.0 10/08/2020: ALT 14; BUN 14; Creatinine, Ser 1.07; Potassium 4.3; Sodium 144; TSH 2.41  Recent Lipid Panel    Component Value Date/Time   CHOL 217 (H) 10/08/2020 1204   TRIG 231.0 (H) 10/08/2020 1204   HDL 55.30 10/08/2020 1204   CHOLHDL 4 10/08/2020 1204   VLDL 46.2 (H) 10/08/2020 1204   LDLCALC 89 01/27/2020 0940   LDLDIRECT 120.0  10/08/2020 1204    Risk Assessment/Calculations:     ASCVD Risk 7.7%  Physical Exam:    VS:  BP 120/77   Pulse 80   Ht '5\' 5"'$  (1.651 m)   Wt 175 lb (79.4 kg)   SpO2 97%   BMI 29.12 kg/m     Wt Readings from Last 3 Encounters:  11/04/20 175 lb (79.4 kg)  10/16/20 173 lb (78.5 kg)  10/02/20 173 lb (78.5 kg)    GEN: Well nourished, well developed in no acute distress HEENT: Normal NECK: No JVD; No carotid bruits LYMPHATICS: No lymphadenopathy CARDIAC: RRR, no murmurs, rubs, gallops RESPIRATORY:  Clear to auscultation with expiratory wheezes in bases ABDOMEN: Soft, non-tender, non-distended MUSCULOSKELETAL: no LE edema, No deformity  SKIN: Warm and dry NEUROLOGIC:  Alert and oriented x 3 PSYCHIATRIC:  Normal affect   ASSESSMENT:    1. SOB (shortness of breath)   2. DOE (dyspnea on exertion)   3. Family history of early CAD   38. COPD  GOLD 2/ still smoking    5. Cigarette smoker   6. Aortic atherosclerosis ([place])   7. Hypertriglyceridemia     PLAN:    In order of problems listed above:  SOB and DOE COPD Family history of CAD - will get Exercise NM Stress Test  Hypertriglyceridemia Aortic Atherosclerosis - rosuvastatin 200 mg and check lipids and lfts in  3 months  Tobacco Abuse- contemplative working to cut her  Three month f/u   Medication Adjustments/Labs and Tests Ordered: Current medicines are reviewed at length with the patient today.  Concerns regarding medicines are outlined above.  Orders Placed This Encounter  Procedures   Lipid panel   ALT   Cardiac Stress Test: Informed Consent Details: Physician/Practitioner Attestation; Transcribe to consent form and obtain patient signature   MYOCARDIAL PERFUSION IMAGING    Meds ordered this encounter  Medications   rosuvastatin (CRESTOR) 20 MG tablet    Sig: Take 1 tablet (20 mg total) by mouth daily.    Dispense:  90 tablet    Refill:  3     Patient Instructions  Medication Instructions:  Your physician has recommended you make the following change in your medication:  INCREASE: rosuvastatin (Crestor) to 20 mg by mouth daily  *If you need a refill on your cardiac medications before your next appointment, please call your pharmacy*   Lab Work: IN 3 Months: FLP, LFT If you have labs (blood work) drawn today and your tests are completely normal, you will receive your results only by: [place] (if you have MyChart) OR A paper copy in the mail If you have any lab test that is abnormal or we need to change your treatment, we will call you to review the results.   Testing/Procedures: Your physician has requested that you have en exercise stress myoview. For further information please visit [email or web address]. Please follow instruction sheet, as given.    Follow-Up: At [facility], you and your health needs are our priority.  As part of our continuing mission to provide you with exceptional heart care, we have created designated Provider Care Teams.  These Care Teams include your primary Cardiologist (physician) and Advanced Practice Providers (APPs -  Physician Assistants and Nurse Practitioners) who all work together to provide you with the care you need, when you  need it.  We recommend signing up for the patient portal called "MyChart".  Sign up information is provided on this After Visit Summary.  MyChart is used  to connect with patients for Virtual Visits (Telemedicine).  Patients are able to view lab/test results, encounter notes, upcoming appointments, etc.  Non-urgent messages can be sent to your provider as well.   To learn more about what you can do with MyChart, go to [email or web address].    Your next appointment:   3 month(s)  The format for your next appointment:   In Person  Provider:   You may see Ladawn Boullion,MD or one of the following Advanced Practice Providers on your designated Care Team:   Melina Copa, PA-C Ermalinda Barrios, PA-C   Other Instructions  You are scheduled for a Myocardial Perfusion Imaging Study. Please arrive 15 minutes prior to your appointment time for registration and insurance purposes.   The test will take approximately 3 to 4 hours to complete; you may bring reading material.  If someone comes with you to your appointment, they will need to remain in the main lobby due to limited space in the testing area. **If you are pregnant or breastfeeding, please notify the nuclear lab prior to your appointment**   How to prepare for your Myocardial Perfusion Test: Do not eat or drink 3 hours prior to your test, except you may have water. Do not consume products containing caffeine (regular or decaffeinated) 12 hours prior to your test. (ex: coffee, chocolate, sodas, tea). Do bring a list of your current medications with you.  If not listed below, you may take your medications as normal.  DO NOT TAKE PROPRANOLO 24 HOURS PRIOR TO TEST Do wear comfortable clothes (no dresses or overalls) and walking shoes, tennis shoes preferred (No heels or open toe shoes are allowed). Do NOT wear cologne, perfume, aftershave, or lotions (deodorant is allowed). If these instructions are not followed, your test will have to be  rescheduled.  If you cannot keep your appointment, please provide 24 hours notification to the Nuclear Lab, to avoid a possible $50 charge to your account.        Signed, Werner Lean, MD  11/04/2020 12:10 PM    [facility]

## 2020-11-05 ENCOUNTER — Telehealth: Payer: Self-pay

## 2020-11-05 NOTE — Telephone Encounter (Signed)
Detailed instructions left on the patient's answering machine. Asked to call back with any questions. S.Austen Wygant EMTP 

## 2020-11-12 ENCOUNTER — Telehealth (HOSPITAL_COMMUNITY): Payer: Self-pay | Admitting: Internal Medicine

## 2020-11-12 ENCOUNTER — Encounter (HOSPITAL_COMMUNITY): Payer: Medicare HMO

## 2020-11-12 NOTE — Telephone Encounter (Signed)
I called patient to reschedule Myoview and patient did not wish to reschedule at this time. She will call us back when she is ready. Order will be removed from the [facility] and when pt calls back we will reinstate the order. Thank you.

## 2020-11-24 ENCOUNTER — Other Ambulatory Visit: Payer: Medicare HMO

## 2020-11-24 ENCOUNTER — Ambulatory Visit: Payer: Medicare HMO | Admitting: Internal Medicine

## 2020-11-24 ENCOUNTER — Encounter: Payer: Self-pay | Admitting: Internal Medicine

## 2020-11-24 ENCOUNTER — Encounter: Payer: Self-pay | Admitting: Gastroenterology

## 2020-11-24 VITALS — BP 130/80 | HR 75 | Ht 64.0 in | Wt 180.0 lb

## 2020-11-24 DIAGNOSIS — K2980 Duodenitis without bleeding: Secondary | ICD-10-CM

## 2020-11-24 DIAGNOSIS — K2100 Gastro-esophageal reflux disease with esophagitis, without bleeding: Secondary | ICD-10-CM

## 2020-11-24 DIAGNOSIS — R197 Diarrhea, unspecified: Secondary | ICD-10-CM | POA: Diagnosis not present

## 2020-11-24 MED ORDER — OMEPRAZOLE 20 MG PO CPDR: 20.0000 mg | DELAYED_RELEASE_CAPSULE | Freq: Every day | ORAL | 3 refills | Status: DC

## 2020-11-24 NOTE — Progress Notes (Signed)
Crystal Duarte 60 y.o. [birth year] [id number]  Assessment & Plan:   Encounter Diagnoses  Name Primary?   Peptic duodenitis Yes   Gastroesophageal reflux disease with esophagitis without hemorrhage    Diarrhea, unspecified type     Not sure why the omeprazole is helping, one would have to assume she had a hyperacidity state and Zollinger-Ellison syndrome is a possibility so I will check a gastrin level.  It may be elevated in the face of omeprazole but should not be significantly high.  Depending upon the results we could need to repeat it off PPI.  For the meantime we will continue a PPI, not sure we need to commit to lifelong therapy.   CC: Crystal Koch, MD Dr. Dustin Duarte   Subjective:   Chief Complaint:  HPI 60 year old woman with a history of anxiety and depression, a benign lung mass and colon polyps, was seen in May by  [facility] regarding diarrhea.  She also has a history of prior colonoscopy in 2016 with a single adenoma.  She had an EGD and colonoscopy colonoscopy as part of her diarrhea work-up on October 16, 2020 by Dr. Candis Duarte.  Pathology as below.  2 polyps were removed from the colon and biopsies were taken as below.   1. Surgical [P], duodenal bulb and 2nd portion of duodenum - PEPTIC DUODENITIS. - NO DYSPLASIA OR MALIGNANCY. 2. Surgical [P], gastric antrum - REACTIVE GASTROPATHY. Crystal Duarte IS NEGATIVE FOR HELICOBACTER PYLORI. - NO INTESTINAL METAPLASIA, DYSPLASIA, OR MALIGNANCY. 3. Surgical [P], gastric body - MILD REACTIVE GASTROPATHY. Crystal Duarte IS NEGATIVE FOR HELICOBACTER PYLORI. - NO INTESTINAL METAPLASIA, DYSPLASIA, OR MALIGNANCY. 4. Surgical [P], random colon sites - BENIGN COLONIC MUCOSA. - NO ACTIVE INFLAMMATION OR EVIDENCE OF MICROSCOPIC COLITIS. - NO DYSPLASIA OR MALIGNANCY. 5. Surgical [P], colon, splenic flexure and sigmoid, polyp (2) - HYPERPLASTIC POLYP (X2 FRAGMENTS). - NO DYSPLASIA OR  MALIGNANCY. She reports that since starting omeprazole 20 mg daily she is completely better.  Diarrhea went away she is eating and gaining weight and she can leave the house without fear of diarrhea and incontinence.  Weight as below.  Wt Readings from Last 3 Encounters:  11/24/20 180 lb (81.6 kg)  11/04/20 175 lb (79.4 kg)  10/16/20 173 lb (78.5 kg)    Allergies  Allergen Reactions   Other Nausea And Vomiting    novacaine    Procaine Nausea And Vomiting   Latuda [Lurasidone Hcl] Anxiety   Sulfa Antibiotics Rash    Only in sunlight    Current Meds  Medication Sig   busPIRone (BUSPAR) 10 MG tablet 20 mg 3 (three) times daily.    gabapentin (NEURONTIN) 400 MG capsule Take 400 mg by mouth 2 (two) times daily.   gabapentin (NEURONTIN) 600 MG tablet Take 600 mg by mouth daily.    Multiple Vitamin (MULTI-VITAMINS) TABS Take 1 tablet by mouth daily.    omeprazole (PRILOSEC) 20 MG capsule Take 1 capsule (20 mg total) by mouth daily.   propranolol (INDERAL) 20 MG tablet Take 1 tablet (20 mg total) by mouth 3 (three) times daily.   rosuvastatin (CRESTOR) 20 MG tablet Take 1 tablet (20 mg total) by mouth daily.   traZODone (DESYREL) 100 MG tablet Take 250 mg by mouth at bedtime.   umeclidinium-vilanterol (ANORO ELLIPTA) 62.5-25 MCG/INH AEPB Inhale 1 puff into the lungs daily.   venlafaxine XR (EFFEXOR-XR) 150 MG 24 hr capsule Take 150 mg by mouth daily with breakfast.  venlafaxine XR (EFFEXOR-XR) 37.5 MG 24 hr capsule Take 37.5 mg by mouth daily.   Past Medical History:  Diagnosis Date   Coccidioidomycosis, pulmonary ([place])    Depression    Hx of adenomatous polyp of colon 12/04/2014   Major depressive disorder    since 14, electroconvulsive therapy, and transcranial magnetic stimulation recently for 6 weeks every day    Osteopenia 01/2019   T score -2.1 FRAX 9.6% / 2.1% stable from prior DEXA   Panic attack    PONV (postoperative nausea and vomiting)    VAIN I (vaginal  intraepithelial neoplasia grade I) 2015   Positive HPV negative subtype 16, 18/45   Past Surgical History:  Procedure Laterality Date   ABDOMINAL HYSTERECTOMY     TAH BSO   BILATERAL VATS ABLATION     vats for biopsy due to infection   BREAST EXCISIONAL BIOPSY Right    benign   COLONOSCOPY  2001   hemorrhoidectomy   HEMORRHOID SURGERY     LUNG SURGERY  2001   VATS surgery    OVARIAN CYST REMOVAL  1970's   ULNAR NERVE TRANSPOSITION Left 12/06/2018   Procedure: LEFT ULNAR NERVE DECOMPRESSION,  ;  Surgeon: Leanora Cover, MD;  Location: [place];  Service: Orthopedics;  Laterality: Left;   VESICOVAGINAL FISTULA CLOSURE W/ TAH  2005   WRIST ARTHROSCOPY Left 03/13/2018   Procedure: ARTHROSCOPY LEFT WRIST WITH DEBRIDEMENT;  Surgeon: Leanora Cover, MD;  Location: [place];  Service: Orthopedics;  Laterality: Left;  block   Social History   Social History Narrative   Lives with son, daughter in law and 4 grandchildren    family history includes Asthma in her mother; Colon polyps in her father and mother; Heart disease in her father; Lung cancer in her maternal grandmother; Ovarian cancer in her mother; Peripheral vascular disease in her father; Varicose Veins in her mother.   Review of Systems As above  Objective:   Physical Exam BP 130/80   Pulse 75   Ht '5\' 4"'$  (1.626 m)   Wt 180 lb (81.6 kg)   BMI 30.90 kg/m

## 2020-11-24 NOTE — Patient Instructions (Addendum)
      Here is the letter you will receive    November 24, 2020     Crystal Duarte [address] [place] [address]   Dear Crystal Duarte,   The biopsies taken from your duodenum and stomach were notable for mild reactive gastropathy and peptic duodenitis which is a common finding and often related to use of certain medications (usually NSAIDs which include common pain medications such as ibuprofen, naproxen, etc), but there was no evidence of Helicobacter pylori infection. This common finding is not felt to necessarily be a cause of any particular symptom and there is no specific treatment or further evaluation recommended, other than to avoid/limit NSAID use.  The biopsies taken from your colon were normal and there was no evidence of Microscopic Colitis or chronic inflammatory changes.   Good news: the polyps that I removed during your recent examination were NOT precancerous.  You should continue to follow current colorectal cancer screening guidelines with a repeat colonoscopy in 10 years.    Please follow up US in the clinic if your symptoms are not improving.   If you have any questions or concerns, please don't hesitate to call.  Sincerely,    Daryel November, MD    Your provider has requested that you go to the basement level for lab work before leaving today. Press "B" on the elevator. The lab is located at the first door on the left as you exit the elevator.  Due to recent changes in healthcare laws, you may see the results of your imaging and laboratory studies on MyChart before your provider has had a chance to review them.  We understand that in some cases there may be results that are confusing or concerning to you. Not all laboratory results come back in the same time frame and the provider may be waiting for multiple results in order to interpret others.  Please give [place] 48 hours in order for your provider to thoroughly review all the results  before contacting the office for clarification of your results.   We have sent the following medications to your pharmacy for you to pick up at your convenience: Omeprazole  I appreciate the opportunity to care for you. Crystal Duarte, [facility]

## 2020-11-25 ENCOUNTER — Telehealth: Payer: Self-pay | Admitting: Internal Medicine

## 2020-11-25 NOTE — Telephone Encounter (Signed)
Left message for patient to call me back at [phone] to schedule Medicare Annual Wellness Visit   No hx of AWV eligible as of 11/03/15  Please schedule at anytime with [facility] Advisor if patient calls the office back.    40 Minutes appointment   Any questions, please call me at [phone]

## 2020-11-26 DIAGNOSIS — R69 Illness, unspecified: Secondary | ICD-10-CM | POA: Diagnosis not present

## 2020-11-26 DIAGNOSIS — F411 Generalized anxiety disorder: Secondary | ICD-10-CM | POA: Diagnosis not present

## 2020-11-27 LAB — GASTRIN: Gastrin: 32 pg/mL (ref <=100)

## 2020-12-09 DIAGNOSIS — R69 Illness, unspecified: Secondary | ICD-10-CM | POA: Diagnosis not present

## 2020-12-09 DIAGNOSIS — F411 Generalized anxiety disorder: Secondary | ICD-10-CM | POA: Diagnosis not present

## 2020-12-09 DIAGNOSIS — F331 Major depressive disorder, recurrent, moderate: Secondary | ICD-10-CM | POA: Diagnosis not present

## 2020-12-14 DIAGNOSIS — F411 Generalized anxiety disorder: Secondary | ICD-10-CM | POA: Diagnosis not present

## 2020-12-14 DIAGNOSIS — R69 Illness, unspecified: Secondary | ICD-10-CM | POA: Diagnosis not present

## 2020-12-14 DIAGNOSIS — F331 Major depressive disorder, recurrent, moderate: Secondary | ICD-10-CM | POA: Diagnosis not present

## 2020-12-26 ENCOUNTER — Other Ambulatory Visit: Payer: Self-pay | Admitting: Internal Medicine

## 2021-01-07 DIAGNOSIS — R69 Illness, unspecified: Secondary | ICD-10-CM | POA: Diagnosis not present

## 2021-01-07 DIAGNOSIS — F411 Generalized anxiety disorder: Secondary | ICD-10-CM | POA: Diagnosis not present

## 2021-01-07 DIAGNOSIS — F331 Major depressive disorder, recurrent, moderate: Secondary | ICD-10-CM | POA: Diagnosis not present

## 2021-01-26 ENCOUNTER — Telehealth (INDEPENDENT_AMBULATORY_CARE_PROVIDER_SITE_OTHER): Payer: Medicare HMO | Admitting: Internal Medicine

## 2021-01-26 DIAGNOSIS — B001 Herpesviral vesicular dermatitis: Secondary | ICD-10-CM

## 2021-01-26 DIAGNOSIS — B372 Candidiasis of skin and nail: Secondary | ICD-10-CM

## 2021-01-26 DIAGNOSIS — F17210 Nicotine dependence, cigarettes, uncomplicated: Secondary | ICD-10-CM

## 2021-01-26 DIAGNOSIS — R051 Acute cough: Secondary | ICD-10-CM

## 2021-01-26 DIAGNOSIS — R062 Wheezing: Secondary | ICD-10-CM | POA: Diagnosis not present

## 2021-01-26 DIAGNOSIS — R69 Illness, unspecified: Secondary | ICD-10-CM | POA: Diagnosis not present

## 2021-01-26 MED ORDER — PREDNISONE 10 MG PO TABS: ORAL_TABLET | ORAL | 0 refills | Status: DC

## 2021-01-26 MED ORDER — VALACYCLOVIR HCL 1 G PO TABS: 1000.0000 mg | ORAL_TABLET | Freq: Two times a day (BID) | ORAL | 0 refills | Status: DC

## 2021-01-26 MED ORDER — LEVOFLOXACIN 500 MG PO TABS: 500.0000 mg | ORAL_TABLET | Freq: Every day | ORAL | 0 refills | Status: AC

## 2021-01-26 MED ORDER — FLUCONAZOLE 100 MG PO TABS: 100.0000 mg | ORAL_TABLET | Freq: Every day | ORAL | 0 refills | Status: AC

## 2021-01-26 MED ORDER — HYDROCODONE BIT-HOMATROP MBR 5-1.5 MG/5ML PO SOLN: 5.0000 mL | Freq: Four times a day (QID) | ORAL | 0 refills | Status: AC | PRN

## 2021-01-26 NOTE — Progress Notes (Signed)
Patient ID: Crystal Duarte, female   DOB: 1960-11-26, 60 y.o.   MRN: [id number]  Virtual Visit via Video Note  I connected with Charle Mclaurin Brewton on 02/02/21 at  3:40 PM EDT by a video enabled telemedicine application and verified that I am speaking with the correct person using two identifiers.  Location of all participants today Patient: at home Provider: at office   I discussed the limitations of evaluation and management by telemedicine and the availability of in person appointments. The patient expressed understanding and agreed to proceed.  History of Present Illness: Here with acute onset mild to mod 2-3 days ST, HA, general weakness and malaise, with prod cough greenish sputum, but Pt denies chest pain, increased sob or doe, wheezing, orthopnea, PND, increased LE swelling, palpitations, dizziness or syncope, except for onset wheezing last pm with mild sob.  Also has a new onset cold sore to upper lip, asks for treatment for pain mild, constant worse to talk and chew, nothing else makes better or worse.  Also has 2 wks worsening bilateral yeast like skin infection under the breasts, itchy, annoying but no fever, chills, drainage. Stil smoking, not ready to quit.  Has not checkd covid and does not want to as this seems typical for other resp infections in the past Past Medical History:  Diagnosis Date   Coccidioidomycosis, pulmonary ([place])    Depression    Hx of adenomatous polyp of colon 12/04/2014   Major depressive disorder    since 14, electroconvulsive therapy, and transcranial magnetic stimulation recently for 6 weeks every day    Osteopenia 01/2019   T score -2.1 FRAX 9.6% / 2.1% stable from prior DEXA   Panic attack    PONV (postoperative nausea and vomiting)    VAIN I (vaginal intraepithelial neoplasia grade I) 2015   Positive HPV negative subtype 16, 18/45   Past Surgical History:  Procedure Laterality Date   ABDOMINAL HYSTERECTOMY     TAH BSO   BILATERAL VATS  ABLATION     vats for biopsy due to infection   BREAST EXCISIONAL BIOPSY Right    benign   COLONOSCOPY  2001   hemorrhoidectomy   HEMORRHOID SURGERY     LUNG SURGERY  2001   VATS surgery    OVARIAN CYST REMOVAL  1970's   ULNAR NERVE TRANSPOSITION Left 12/06/2018   Procedure: LEFT ULNAR NERVE DECOMPRESSION,  ;  Surgeon: Leanora Cover, MD;  Location: [place];  Service: Orthopedics;  Laterality: Left;   VESICOVAGINAL FISTULA CLOSURE W/ TAH  2005   WRIST ARTHROSCOPY Left 03/13/2018   Procedure: ARTHROSCOPY LEFT WRIST WITH DEBRIDEMENT;  Surgeon: Leanora Cover, MD;  Location: [place];  Service: Orthopedics;  Laterality: Left;  block    reports that she has been smoking cigarettes. She has a 10.50 pack-year smoking history. She has never used smokeless tobacco. She reports that she does not drink alcohol and does not use drugs. family history includes Asthma in her mother; Colon polyps in her father and mother; Heart disease in her father; Lung cancer in her maternal grandmother; Ovarian cancer in her mother; Peripheral vascular disease in her father; Varicose Veins in her mother. Allergies  Allergen Reactions   Other Nausea And Vomiting    novacaine    Procaine Nausea And Vomiting   Latuda [Lurasidone Hcl] Anxiety   Sulfa Antibiotics Rash    Only in sunlight    Current Outpatient Medications on File Prior to Visit  Medication  Sig Dispense Refill   busPIRone (BUSPAR) 10 MG tablet 20 mg 3 (three) times daily.      gabapentin (NEURONTIN) 400 MG capsule Take 400 mg by mouth 2 (two) times daily.     gabapentin (NEURONTIN) 600 MG tablet Take 600 mg by mouth daily.      Multiple Vitamin (MULTI-VITAMINS) TABS Take 1 tablet by mouth daily.      omeprazole (PRILOSEC) 20 MG capsule Take 1 capsule (20 mg total) by mouth daily. 90 capsule 3   propranolol (INDERAL) 20 MG tablet Take 1 tablet (20 mg total) by mouth 3 (three) times daily. 90 tablet 0   rosuvastatin  (CRESTOR) 20 MG tablet Take 1 tablet (20 mg total) by mouth daily. 90 tablet 3   traZODone (DESYREL) 100 MG tablet Take 250 mg by mouth at bedtime.     venlafaxine XR (EFFEXOR-XR) 150 MG 24 hr capsule Take 150 mg by mouth daily with breakfast.      venlafaxine XR (EFFEXOR-XR) 37.5 MG 24 hr capsule Take 37.5 mg by mouth daily.     No current facility-administered medications on file prior to visit.    Observations/Objective: Alert, NAD, appropriate mood and affect, resps normal, cn 2-12 intact, moves all 4s, no visible swelling Past Medical History:  Diagnosis Date   Coccidioidomycosis, pulmonary ([place])    Depression    Hx of adenomatous polyp of colon 12/04/2014   Major depressive disorder    since 14, electroconvulsive therapy, and transcranial magnetic stimulation recently for 6 weeks every day    Osteopenia 01/2019   T score -2.1 FRAX 9.6% / 2.1% stable from prior DEXA   Panic attack    PONV (postoperative nausea and vomiting)    VAIN I (vaginal intraepithelial neoplasia grade I) 2015   Positive HPV negative subtype 16, 18/45   Past Surgical History:  Procedure Laterality Date   ABDOMINAL HYSTERECTOMY     TAH BSO   BILATERAL VATS ABLATION     vats for biopsy due to infection   BREAST EXCISIONAL BIOPSY Right    benign   COLONOSCOPY  2001   hemorrhoidectomy   HEMORRHOID SURGERY     LUNG SURGERY  2001   VATS surgery    OVARIAN CYST REMOVAL  1970's   ULNAR NERVE TRANSPOSITION Left 12/06/2018   Procedure: LEFT ULNAR NERVE DECOMPRESSION,  ;  Surgeon: Leanora Cover, MD;  Location: [place];  Service: Orthopedics;  Laterality: Left;   VESICOVAGINAL FISTULA CLOSURE W/ TAH  2005   WRIST ARTHROSCOPY Left 03/13/2018   Procedure: ARTHROSCOPY LEFT WRIST WITH DEBRIDEMENT;  Surgeon: Leanora Cover, MD;  Location: [place];  Service: Orthopedics;  Laterality: Left;  block    reports that she has been smoking cigarettes. She has a 10.50 pack-year smoking history.  She has never used smokeless tobacco. She reports that she does not drink alcohol and does not use drugs. family history includes Asthma in her mother; Colon polyps in her father and mother; Heart disease in her father; Lung cancer in her maternal grandmother; Ovarian cancer in her mother; Peripheral vascular disease in her father; Varicose Veins in her mother. Allergies  Allergen Reactions   Other Nausea And Vomiting    novacaine    Procaine Nausea And Vomiting   Latuda [Lurasidone Hcl] Anxiety   Sulfa Antibiotics Rash    Only in sunlight    Current Outpatient Medications on File Prior to Visit  Medication Sig Dispense Refill   busPIRone (BUSPAR) 10  MG tablet 20 mg 3 (three) times daily.      gabapentin (NEURONTIN) 400 MG capsule Take 400 mg by mouth 2 (two) times daily.     gabapentin (NEURONTIN) 600 MG tablet Take 600 mg by mouth daily.      Multiple Vitamin (MULTI-VITAMINS) TABS Take 1 tablet by mouth daily.      omeprazole (PRILOSEC) 20 MG capsule Take 1 capsule (20 mg total) by mouth daily. 90 capsule 3   propranolol (INDERAL) 20 MG tablet Take 1 tablet (20 mg total) by mouth 3 (three) times daily. 90 tablet 0   rosuvastatin (CRESTOR) 20 MG tablet Take 1 tablet (20 mg total) by mouth daily. 90 tablet 3   traZODone (DESYREL) 100 MG tablet Take 250 mg by mouth at bedtime.     venlafaxine XR (EFFEXOR-XR) 150 MG 24 hr capsule Take 150 mg by mouth daily with breakfast.      venlafaxine XR (EFFEXOR-XR) 37.5 MG 24 hr capsule Take 37.5 mg by mouth daily.     No current facility-administered medications on file prior to visit.   Assessment and Plan: See notes  Follow Up Instructions: See notes   I discussed the assessment and treatment plan with the patient. The patient was provided an opportunity to ask questions and all were answered. The patient agreed with the plan and demonstrated an understanding of the instructions.   The patient was advised to call back or seek an in-person  evaluation if the symptoms worsen or if the condition fails to improve as anticipated.   Cathlean Cower, MD

## 2021-01-27 ENCOUNTER — Other Ambulatory Visit: Payer: Self-pay | Admitting: Internal Medicine

## 2021-01-27 DIAGNOSIS — R69 Illness, unspecified: Secondary | ICD-10-CM | POA: Diagnosis not present

## 2021-01-27 DIAGNOSIS — F411 Generalized anxiety disorder: Secondary | ICD-10-CM | POA: Diagnosis not present

## 2021-01-27 DIAGNOSIS — F331 Major depressive disorder, recurrent, moderate: Secondary | ICD-10-CM | POA: Diagnosis not present

## 2021-01-29 ENCOUNTER — Other Ambulatory Visit: Payer: Self-pay | Admitting: Internal Medicine

## 2021-02-02 ENCOUNTER — Encounter: Payer: Self-pay | Admitting: Internal Medicine

## 2021-02-02 DIAGNOSIS — R059 Cough, unspecified: Secondary | ICD-10-CM | POA: Insufficient documentation

## 2021-02-02 DIAGNOSIS — B372 Candidiasis of skin and nail: Secondary | ICD-10-CM | POA: Insufficient documentation

## 2021-02-02 DIAGNOSIS — B001 Herpesviral vesicular dermatitis: Secondary | ICD-10-CM | POA: Insufficient documentation

## 2021-02-02 DIAGNOSIS — R062 Wheezing: Secondary | ICD-10-CM | POA: Insufficient documentation

## 2021-02-02 NOTE — Patient Instructions (Signed)
Please take all new medication as prescribed 

## 2021-02-02 NOTE — Assessment & Plan Note (Signed)
Mild to mod, for predpac asd, albuterol hfa prn,  to f/u any worsening symptoms or concerns

## 2021-02-02 NOTE — Assessment & Plan Note (Signed)
Counseled to quit, pt not ready 

## 2021-02-02 NOTE — Assessment & Plan Note (Signed)
Mild to mod, for diflucan course,  to f/u any worsening symptoms or concerns 

## 2021-02-02 NOTE — Assessment & Plan Note (Signed)
Mild to mod, for valtrex course,  to f/u any worsening symptoms or concerns

## 2021-02-02 NOTE — Assessment & Plan Note (Signed)
Mild to mod, decliens cxr, for antibx course, cough med prn,  to f/u any worsening symptoms or concerns

## 2021-02-05 ENCOUNTER — Other Ambulatory Visit: Payer: Medicare HMO

## 2021-02-07 ENCOUNTER — Other Ambulatory Visit: Payer: Self-pay | Admitting: Internal Medicine

## 2021-02-07 NOTE — Telephone Encounter (Signed)
To PCP please ?

## 2021-02-11 NOTE — Progress Notes (Signed)
. Cardiology Office Note:    Date:  02/12/2021   ID:  Crystal Duarte, DOB 1960-06-28, MRN [id number]  PCP:  [name], MD  [facility] Cardiologist:  Werner Lean, MD  [facility] Electrophysiologist:  None   Referring MD: [name], *   CC:DOE f/u  History of Present Illness:    Crystal Duarte is a 61 y.o. female with a hx of Pulmonary Coccidomycosis in 1996, COPD, Tobacco Abuse, Peripheral Arterial Disease who presents with SOB.  Since Winter 2022  has worsening SOB.  Seen 11/04/20.  In interim of this visit, patient had stress test ordered but not performed.  Seen 02/12/21.  Patient notes that she is having a new change:  she now has issues walking the dog.  Can work any more because she gets so winded.  She feels really hard hard beats and doesn't feel like she can catch her breath.  Has chest pain that is on the right side.  Notes significant fatigue.  Wakes up exhausted. Everything took a turn for the worst last October.  Was placed on prednisone and didn't make much of a different.  Has new palpitations that have occurred at least 10 times over the last two weeks.     Past Medical History:  Diagnosis Date   Coccidioidomycosis, pulmonary ([place])    Depression    Hx of adenomatous polyp of colon 12/04/2014   Major depressive disorder    since 14, electroconvulsive therapy, and transcranial magnetic stimulation recently for 6 weeks every day    Osteopenia 01/2019   T score -2.1 FRAX 9.6% / 2.1% stable from prior DEXA   Panic attack    PONV (postoperative nausea and vomiting)    VAIN I (vaginal intraepithelial neoplasia grade I) 2015   Positive HPV negative subtype 16, 18/45    Past Surgical History:  Procedure Laterality Date   ABDOMINAL HYSTERECTOMY     TAH BSO   BILATERAL VATS ABLATION     vats for biopsy due to infection   BREAST EXCISIONAL BIOPSY Right    benign   COLONOSCOPY  2001   hemorrhoidectomy   HEMORRHOID SURGERY      LUNG SURGERY  2001   VATS surgery    OVARIAN CYST REMOVAL  1970's   ULNAR NERVE TRANSPOSITION Left 12/06/2018   Procedure: LEFT ULNAR NERVE DECOMPRESSION,  ;  Surgeon: Leanora Cover, MD;  Location: [place];  Service: Orthopedics;  Laterality: Left;   VESICOVAGINAL FISTULA CLOSURE W/ TAH  2005   WRIST ARTHROSCOPY Left 03/13/2018   Procedure: ARTHROSCOPY LEFT WRIST WITH DEBRIDEMENT;  Surgeon: Leanora Cover, MD;  Location: [place];  Service: Orthopedics;  Laterality: Left;  block   Current Medications: Current Meds  Medication Sig   albuterol (VENTOLIN HFA) 108 (90 Base) MCG/ACT inhaler Inhale into the lungs as needed.   ANORO ELLIPTA 62.5-25 MCG/ACT AEPB INHALE ONE DOSE BY MOUTH DAILY   busPIRone (BUSPAR) 10 MG tablet 20 mg 3 (three) times daily.    gabapentin (NEURONTIN) 400 MG capsule Take 400 mg by mouth 2 (two) times daily.   gabapentin (NEURONTIN) 600 MG tablet Take 600 mg by mouth daily.    Multiple Vitamin (MULTI-VITAMINS) TABS Take 1 tablet by mouth daily.    omeprazole (PRILOSEC) 20 MG capsule Take 1 capsule (20 mg total) by mouth daily.   predniSONE (DELTASONE) 10 MG tablet 3 tabs by mouth per day for 3 days,2tabs per day for 3  days,1tab per day for 3 days   propranolol (INDERAL) 20 MG tablet Take 1 tablet (20 mg total) by mouth 3 (three) times daily.   traZODone (DESYREL) 100 MG tablet Take 250 mg by mouth at bedtime.   valACYclovir (VALTREX) 1000 MG tablet TAKE ONE TABLET BY MOUTH TWICE A DAY FOR 7 DAYS   venlafaxine XR (EFFEXOR-XR) 150 MG 24 hr capsule Take 150 mg by mouth daily with breakfast.    venlafaxine XR (EFFEXOR-XR) 37.5 MG 24 hr capsule Take 37.5 mg by mouth daily.    Allergies:   Other, Procaine, Latuda [lurasidone hcl], and Sulfa antibiotics   Social History   Socioeconomic History   Marital status: Single    Spouse name: Not on file   Number of children: 2   Years of education: Not on file   Highest education level: Not  on file  Occupational History   Occupation: Herbalist    Comment: disability    Employer: [employer]  Tobacco Use   Smoking status: Some Days    Packs/day: 0.25    Years: 42.00    Pack years: 10.50    Types: Cigarettes   Smokeless tobacco: Never  Vaping Use   Vaping Use: Never used  Substance and Sexual Activity   Alcohol use: No    Alcohol/week: 0.0 standard drinks   Drug use: No   Sexual activity: Not Currently    Birth control/protection: Surgical    Comment: 1st intercourse 60 yo-More than 5 partners  Other Topics Concern   Not on file  Social History Narrative   Lives with son, daughter in [place] and 4 grandchildren    Social Determinants of Health   Financial Resource Strain: Not on file  Food Insecurity: Not on file  Transportation Needs: Not on file  Physical Activity: Not on file  Stress: Not on file  Social Connections: Not on file   Family History: The patient's family history includes Asthma in her mother; Colon polyps in her father and mother; Heart disease in her father; Lung cancer in her maternal grandmother; Ovarian cancer in her mother; Peripheral vascular disease in her father; Varicose Veins in her mother. There is no history of Colon cancer, Esophageal cancer, Stomach cancer, Pancreatic cancer, or Liver disease. Father had CABG at age 9  ROS:   Please see the history of present illness.    All other systems reviewed and are negative.  EKGs/Labs/Other Studies Reviewed:    The following studies were reviewed today:  EKG:   02/12/21: ST rate 75 WNL 2021 sinus rhythm rate 73 no ST changes or TWI 11/03/2017 EKG: Sinus Tachycardia 109 no ST/T wave abnormalities  Transthoracic Echocardiogram: Date: 03/30/20 Results: 1. Left ventricular ejection fraction, by estimation, is 60 to 65%. The  left ventricle has normal function. The left ventricle has no regional  wall motion abnormalities. Left ventricular diastolic parameters were  normal.    2. Right ventricular systolic function is normal. The right ventricular  size is normal. Tricuspid regurgitation signal is inadequate for assessing  PA pressure.   3. The mitral valve is normal in structure. No evidence of mitral valve  regurgitation. No evidence of mitral stenosis.   4. The aortic valve is tricuspid. Aortic valve regurgitation is not  visualized. No aortic stenosis is present.   CT Chest Date: 04/06/20 Results: Small aortic atherosclerosis  Recent Labs: 03/02/2020: NT-Pro BNP 57 08/03/2020: Hemoglobin 14.6; Platelets 244.0 10/08/2020: ALT 14; BUN 14; Creatinine, Ser 1.07; Potassium 4.3; Sodium  144; TSH 2.41  Recent Lipid Panel    Component Value Date/Time   CHOL 217 (H) 10/08/2020 1204   TRIG 231.0 (H) 10/08/2020 1204   HDL 55.30 10/08/2020 1204   CHOLHDL 4 10/08/2020 1204   VLDL 46.2 (H) 10/08/2020 1204   LDLCALC 89 01/27/2020 0940   LDLDIRECT 120.0 10/08/2020 1204    Risk Assessment/Calculations:     ASCVD Risk 7.7%  Physical Exam:    VS:  BP 100/60   Pulse 75   Ht 5\' 5"  (1.651 m)   Wt 187 lb 9.6 oz (85.1 kg)   SpO2 95%   BMI 31.22 kg/m     Wt Readings from Last 3 Encounters:  02/12/21 187 lb 9.6 oz (85.1 kg)  11/24/20 180 lb (81.6 kg)  11/04/20 175 lb (79.4 kg)    Gen: No distress, well nourished Neck: No JVD Cardiac: No Rubs or Gallops, no Murmur, normal rhythm +2r adial pulses Respiratory: Clear to auscultation bilaterally (no wheezes today), normal effort, normal  respiratory rate GI: Soft, nontender, non-distended  MS: No  edema;  moves all extremities Integument: Skin feels warm Neuro:  At time of evaluation, alert and oriented to person/place/time/situation  Psych: Normal affect, patient feels poorly   ASSESSMENT:    1. DOE (dyspnea on exertion)   2. Palpitations   3. Aortic atherosclerosis (HCC)      PLAN:    In order of problems listed above:  SOB and DOE COPD Family history of CAD - will get NM Stress test (lexi  scan) - will get BNP and BMP - if work up is negative may need pulm and PFTs  New Paplitations - 14 day non live ziopatch - may need to alter her Inderal based on results  Hypertriglyceridemia Aortic Atherosclerosis - rosuvastatin 20 mg and check lipids and lfts today  Tobacco Abuse - actively working to stop smoking but has not made much progress  Three month f/u with me   Medication Adjustments/Labs and Tests Ordered: Current medicines are reviewed at length with the patient today.  Concerns regarding medicines are outlined above.  Orders Placed This Encounter  Procedures   Basic metabolic panel   Pro b natriuretic peptide (BNP)   Lipid panel   Hepatic function panel   Cardiac Stress Test: Informed Consent Details: Physician/Practitioner Attestation; Transcribe to consent form and obtain patient signature   LONG TERM MONITOR (3-14 DAYS)   MYOCARDIAL PERFUSION IMAGING   EKG 12-Lead     No orders of the defined types were placed in this encounter.    Patient Instructions  Medication Instructions:  Your physician recommends that you continue on your current medications as directed. Please refer to the Current Medication list given to you today.  *If you need a refill on your cardiac medications before your next appointment, please call your pharmacy*   Lab Work: TODAY: BNP, BMP, Lipid panel, LFT If you have labs (blood work) drawn today and your tests are completely normal, you will receive your results only by: [place] (if you have MyChart) OR A paper copy in the mail If you have any lab test that is abnormal or we need to change your treatment, we will call you to review the results.   Testing/Procedures: Your physician has requested that you have a lexiscan myoview. For further information please visit [email or web address]. Please follow instruction sheet, as given.    Follow-Up: At [facility], you and your health needs are our priority.  As part  of  our continuing mission to provide you with exceptional heart care, we have created designated Provider Care Teams.  These Care Teams include your primary Cardiologist (physician) and Advanced Practice Providers (APPs -  Physician Assistants and Nurse Practitioners) who all work together to provide you with the care you need, when you need it.  We recommend signing up for the patient portal called "MyChart".  Sign up information is provided on this After Visit Summary.  MyChart is used to connect with patients for Virtual Visits (Telemedicine).  Patients are able to view lab/test results, encounter notes, upcoming appointments, etc.  Non-urgent messages can be sent to your provider as well.   To learn more about what you can do with MyChart, go to [email or web address].    Your next appointment:   3 month(s)  The format for your next appointment:   In Person  Provider:   Werner Lean, MD     Other Instructions  You are scheduled for a Myocardial Perfusion Imaging Study. Please arrive 15 minutes prior to your appointment time for registration and insurance purposes.   The test will take approximately 3 to 4 hours to complete; you may bring reading material.  If someone comes with you to your appointment, they will need to remain in the main lobby due to limited space in the testing area.    How to prepare for your Myocardial Perfusion Test: Do not eat or drink 3 hours prior to your test, except you may have water. Do not consume products containing caffeine (regular or decaffeinated) 12 hours prior to your test. (ex: coffee, chocolate, sodas, tea). Do bring a list of your current medications with you.  If not listed below, you may take your medications as normal. DO NOT TAKE PROPRANOLOL 24 HOURS PRIOR TO TEST Do wear comfortable clothes (no dresses or overalls) and walking shoes, tennis shoes preferred (No heels or open toe shoes are allowed). Do NOT wear cologne, perfume,  aftershave, or lotions (deodorant is allowed). If these instructions are not followed, your test will have to be rescheduled.  If you cannot keep your appointment, please provide 24 hours notification to the Nuclear Lab, to avoid a possible $50 charge to your account.      ZIO XT- Long Term Monitor Instructions  Your physician has requested you wear a ZIO patch monitor for 14 days.  This is a single patch monitor. [employer] supplies one patch monitor per enrollment. Additional stickers are not available. Please do not apply patch if you will be having a Nuclear Stress Test,  Echocardiogram, Cardiac CT, MRI, or Chest Xray during the period you would be wearing the  monitor. The patch cannot be worn during these tests. You cannot remove and re-apply the  ZIO XT patch monitor.  Your ZIO patch monitor will be mailed 3 day USPS to your address on file. It may take 3-5 days  to receive your monitor after you have been enrolled.  Once you have received your monitor, please review the enclosed instructions. Your monitor  has already been registered assigning a specific monitor serial # to you.  Billing and Patient Assistance Program Information  We have supplied [employer] with any of your insurance information on file for billing purposes. [employer] offers a sliding scale Patient Assistance Program for patients that do not have  insurance, or whose insurance does not completely cover the cost of the ZIO monitor.  You must apply for the Patient Assistance Program to qualify for this discounted rate.  To apply, please call [employer] at [phone], select option 4, select option 2, ask to apply for  Patient Assistance Program. Theodore Demark will ask your household income, and how many people  are in your household. They will quote your out-of-pocket cost based on that information.  [employer] will also be able to set up a 49-month, interest-free payment plan if needed.  Applying the monitor   Shave hair from  upper left chest.  Hold abrader disc by orange tab. Rub abrader in 40 strokes over the upper left chest as  indicated in your monitor instructions.  Clean area with 4 enclosed alcohol pads. Let dry.  Apply patch as indicated in monitor instructions. Patch will be placed under collarbone on left  side of chest with arrow pointing upward.  Rub patch adhesive wings for 2 minutes. Remove white label marked "1". Remove the white  label marked "2". Rub patch adhesive wings for 2 additional minutes.  While looking in a mirror, press and release button in center of patch. A small green light will  flash 3-4 times. This will be your only indicator that the monitor has been turned on.  Do not shower for the first 24 hours. You may shower after the first 24 hours.  Press the button if you feel a symptom. You will hear a small click. Record Date, Time and  Symptom in the Patient Logbook.  When you are ready to remove the patch, follow instructions on the last 2 pages of Patient  Logbook. Stick patch monitor onto the last page of Patient Logbook.  Place Patient Logbook in the blue and white box. Use locking tab on box and tape box closed  securely. The blue and white box has prepaid postage on it. Please place it in the mailbox as  soon as possible. Your physician should have your test results approximately 7 days after the  monitor has been mailed back to [facility].  Call [place] at [phone] if you have questions regarding  your ZIO XT patch monitor. Call them immediately if you see an orange light blinking on your  monitor.  If your monitor falls off in less than 4 days, contact our Monitor department at [phone].  If your monitor becomes loose or falls off after 4 days call [employer] at [phone] for  suggestions on securing your monitor   Signed, Werner Lean, MD  02/12/2021 2:48 PM    [place]

## 2021-02-12 ENCOUNTER — Ambulatory Visit: Payer: Medicare HMO | Admitting: Internal Medicine

## 2021-02-12 ENCOUNTER — Encounter: Payer: Self-pay | Admitting: Internal Medicine

## 2021-02-12 ENCOUNTER — Ambulatory Visit (INDEPENDENT_AMBULATORY_CARE_PROVIDER_SITE_OTHER): Payer: Medicare HMO

## 2021-02-12 ENCOUNTER — Other Ambulatory Visit: Payer: Self-pay

## 2021-02-12 ENCOUNTER — Encounter (HOSPITAL_COMMUNITY): Payer: Self-pay | Admitting: *Deleted

## 2021-02-12 VITALS — BP 100/60 | HR 75 | Ht 65.0 in | Wt 187.6 lb

## 2021-02-12 DIAGNOSIS — R0609 Other forms of dyspnea: Secondary | ICD-10-CM | POA: Diagnosis not present

## 2021-02-12 DIAGNOSIS — I700 Atherosclerosis of aorta: Secondary | ICD-10-CM

## 2021-02-12 DIAGNOSIS — R002 Palpitations: Secondary | ICD-10-CM | POA: Diagnosis not present

## 2021-02-12 NOTE — Progress Notes (Unsigned)
Enrolled patient for a 14 day Zio XT  monitor to be mailed to patients home  °

## 2021-02-12 NOTE — Patient Instructions (Addendum)
Medication Instructions:  Your physician recommends that you continue on your current medications as directed. Please refer to the Current Medication list given to you today.  *If you need a refill on your cardiac medications before your next appointment, please call your pharmacy*   Lab Work: TODAY: BNP, BMP, Lipid panel, LFT If you have labs (blood work) drawn today and your tests are completely normal, you will receive your results only by: [place] (if you have MyChart) OR A paper copy in the mail If you have any lab test that is abnormal or we need to change your treatment, we will call you to review the results.   Testing/Procedures: Your physician has requested that you have a lexiscan myoview. For further information please visit [email or web address]. Please follow instruction sheet, as given.    Follow-Up: At [facility], you and your health needs are our priority.  As part of our continuing mission to provide you with exceptional heart care, we have created designated Provider Care Teams.  These Care Teams include your primary Cardiologist (physician) and Advanced Practice Providers (APPs -  Physician Assistants and Nurse Practitioners) who all work together to provide you with the care you need, when you need it.  We recommend signing up for the patient portal called "MyChart".  Sign up information is provided on this After Visit Summary.  MyChart is used to connect with patients for Virtual Visits (Telemedicine).  Patients are able to view lab/test results, encounter notes, upcoming appointments, etc.  Non-urgent messages can be sent to your provider as well.   To learn more about what you can do with MyChart, go to [email or web address].    Your next appointment:   3 month(s)  The format for your next appointment:   In Person  Provider:   Werner Lean, MD     Other Instructions  You are scheduled for a Myocardial Perfusion Imaging Study. Please  arrive 15 minutes prior to your appointment time for registration and insurance purposes.   The test will take approximately 3 to 4 hours to complete; you may bring reading material.  If someone comes with you to your appointment, they will need to remain in the main lobby due to limited space in the testing area.    How to prepare for your Myocardial Perfusion Test: Do not eat or drink 3 hours prior to your test, except you may have water. Do not consume products containing caffeine (regular or decaffeinated) 12 hours prior to your test. (ex: coffee, chocolate, sodas, tea). Do bring a list of your current medications with you.  If not listed below, you may take your medications as normal. DO NOT TAKE PROPRANOLOL 24 HOURS PRIOR TO TEST Do wear comfortable clothes (no dresses or overalls) and walking shoes, tennis shoes preferred (No heels or open toe shoes are allowed). Do NOT wear cologne, perfume, aftershave, or lotions (deodorant is allowed). If these instructions are not followed, your test will have to be rescheduled.  If you cannot keep your appointment, please provide 24 hours notification to the Nuclear Lab, to avoid a possible $50 charge to your account.      ZIO XT- Long Term Monitor Instructions  Your physician has requested you wear a ZIO patch monitor for 14 days.  This is a single patch monitor. [employer] supplies one patch monitor per enrollment. Additional stickers are not available. Please do not apply patch if you will be having a Nuclear Stress Test,  Echocardiogram, Cardiac CT, MRI, or  Chest Xray during the period you would be wearing the  monitor. The patch cannot be worn during these tests. You cannot remove and re-apply the  ZIO XT patch monitor.  Your ZIO patch monitor will be mailed 3 day USPS to your address on file. It may take 3-5 days  to receive your monitor after you have been enrolled.  Once you have received your monitor, please review the enclosed  instructions. Your monitor  has already been registered assigning a specific monitor serial # to you.  Billing and Patient Assistance Program Information  We have supplied [employer] with any of your insurance information on file for billing purposes. [employer] offers a sliding scale Patient Assistance Program for patients that do not have  insurance, or whose insurance does not completely cover the cost of the ZIO monitor.  You must apply for the Patient Assistance Program to qualify for this discounted rate.  To apply, please call [employer] at [phone], select option 4, select option 2, ask to apply for  Patient Assistance Program. Theodore Demark will ask your household income, and how many people  are in your household. They will quote your out-of-pocket cost based on that information.  [employer] will also be able to set up a 51-month, interest-free payment plan if needed.  Applying the monitor   Shave hair from upper left chest.  Hold abrader disc by orange tab. Rub abrader in 40 strokes over the upper left chest as  indicated in your monitor instructions.  Clean area with 4 enclosed alcohol pads. Let dry.  Apply patch as indicated in monitor instructions. Patch will be placed under collarbone on left  side of chest with arrow pointing upward.  Rub patch adhesive wings for 2 minutes. Remove white label marked "1". Remove the white  label marked "2". Rub patch adhesive wings for 2 additional minutes.  While looking in a mirror, press and release button in center of patch. A small green light will  flash 3-4 times. This will be your only indicator that the monitor has been turned on.  Do not shower for the first 24 hours. You may shower after the first 24 hours.  Press the button if you feel a symptom. You will hear a small click. Record Date, Time and  Symptom in the Patient Logbook.  When you are ready to remove the patch, follow instructions on the last 2 pages of Patient  Logbook. Stick patch  monitor onto the last page of Patient Logbook.  Place Patient Logbook in the blue and white box. Use locking tab on box and tape box closed  securely. The blue and white box has prepaid postage on it. Please place it in the mailbox as  soon as possible. Your physician should have your test results approximately 7 days after the  monitor has been mailed back to [facility].  Call [place] at [phone] if you have questions regarding  your ZIO XT patch monitor. Call them immediately if you see an orange light blinking on your  monitor.  If your monitor falls off in less than 4 days, contact our Monitor department at [phone].  If your monitor becomes loose or falls off after 4 days call [employer] at [phone] for  suggestions on securing your monitor

## 2021-02-13 LAB — LIPID PANEL
Chol/HDL Ratio: 2.3 ratio (ref 0.0–4.4)
Cholesterol, Total: 126 mg/dL (ref 100–199)
HDL: 56 mg/dL (ref >39)
LDL Chol Calc (NIH): 54 mg/dL (ref 0–99)
Triglycerides: 81 mg/dL (ref 0–149)
VLDL Cholesterol Cal: 16 mg/dL (ref 5–40)

## 2021-02-13 LAB — PRO B NATRIURETIC PEPTIDE: NT-Pro BNP: 57 pg/mL (ref 0–287)

## 2021-02-13 LAB — HEPATIC FUNCTION PANEL
ALT: 19 IU/L (ref 0–32)
AST: 16 IU/L (ref 0–40)
Albumin: 4.2 g/dL (ref 3.8–4.8)
Alkaline Phosphatase: 92 IU/L (ref 44–121)
Bilirubin Total: 0.3 mg/dL (ref 0.0–1.2)
Bilirubin, Direct: 0.12 mg/dL (ref 0.00–0.40)
Total Protein: 5.9 g/dL — ABNORMAL LOW (ref 6.0–8.5)

## 2021-02-13 LAB — BASIC METABOLIC PANEL
BUN/Creatinine Ratio: 11 — ABNORMAL LOW (ref 12–28)
BUN: 10 mg/dL (ref 8–27)
CO2: 25 mmol/L (ref 20–29)
Calcium: 9.3 mg/dL (ref 8.7–10.3)
Chloride: 108 mmol/L — ABNORMAL HIGH (ref 96–106)
Creatinine, Ser: 0.87 mg/dL (ref 0.57–1.00)
Glucose: 106 mg/dL — ABNORMAL HIGH (ref 70–99)
Potassium: 5 mmol/L (ref 3.5–5.2)
Sodium: 147 mmol/L — ABNORMAL HIGH (ref 134–144)
eGFR: 76 mL/min/{1.73_m2} (ref >59)

## 2021-02-15 MED ORDER — ROSUVASTATIN CALCIUM 20 MG PO TABS: 20.0000 mg | ORAL_TABLET | Freq: Every day | ORAL | 3 refills | Status: DC

## 2021-02-15 NOTE — Telephone Encounter (Signed)
Refilled rosuvastatin per pt my chart message.

## 2021-02-16 ENCOUNTER — Other Ambulatory Visit: Payer: Self-pay

## 2021-02-16 ENCOUNTER — Encounter: Payer: Self-pay | Admitting: Primary Care

## 2021-02-16 ENCOUNTER — Telehealth: Payer: Self-pay | Admitting: Primary Care

## 2021-02-16 ENCOUNTER — Ambulatory Visit: Payer: Medicare HMO | Admitting: Primary Care

## 2021-02-16 VITALS — BP 128/82 | HR 75 | Ht 65.0 in | Wt 190.0 lb

## 2021-02-16 DIAGNOSIS — J309 Allergic rhinitis, unspecified: Secondary | ICD-10-CM | POA: Diagnosis not present

## 2021-02-16 DIAGNOSIS — J449 Chronic obstructive pulmonary disease, unspecified: Secondary | ICD-10-CM

## 2021-02-16 DIAGNOSIS — R002 Palpitations: Secondary | ICD-10-CM | POA: Diagnosis not present

## 2021-02-16 DIAGNOSIS — I700 Atherosclerosis of aorta: Secondary | ICD-10-CM | POA: Diagnosis not present

## 2021-02-16 MED ORDER — MONTELUKAST SODIUM 10 MG PO TABS: 10.0000 mg | ORAL_TABLET | Freq: Every day | ORAL | 11 refills | Status: DC

## 2021-02-16 NOTE — Telephone Encounter (Signed)
Fine with me

## 2021-02-16 NOTE — Telephone Encounter (Signed)
I will see her if that's what she wants.

## 2021-02-16 NOTE — Patient Instructions (Addendum)
Recommendations: - Stay on Anoro one puff daily - Take Mucinex 600-1200mg  twice a day - Use flutter valve three times a day - Start Montelukast (Singulair) 10mg  daily at bedtime   Orders: - Pulmonary function testing (first available- please order)  - CT chest wo contrast re: shortness of breath (first available- please order)   Follow-up  - 2-4 weeks with Dr. Annamaria Boots (1 hour PFT prior)   Montelukast Tablets What is this medication? MONTELUKAST (mon te LOO kast) prevents and treats the symptoms of asthma and allergies. It works by decreasing inflammation in the airways, making it easier to breathe. Do not use this medication to treat a sudden asthma attack. This medicine may be used for other purposes; ask your health care provider or pharmacist if you have questions. COMMON BRAND NAME(S): Singulair What should I tell my care team before I take this medication? They need to know if you have any of these conditions: Liver disease An unusual or allergic reaction to montelukast, other medications, foods, dyes, or preservatives Pregnant or trying to get pregnant Breast-feeding How should I use this medication? Take this medication by mouth with water. Take it as directed on the prescription label at the same time every day. You can take this medication with or without food. If it upsets your stomach, take it with food. Keep taking it unless your care team tells you to stop. A special MedGuide will be given to you by the pharmacist with each prescription and refill. Be sure to read this information carefully each time. Talk to your care team about the use of this medication in children. While this medication may be prescribed for children as young as 15 years for selected conditions, precautions do apply. Overdosage: If you think you have taken too much of this medicine contact a poison control center or emergency room at once. NOTE: This medicine is only for you. Do not share this medicine  with others. What if I miss a dose? If you miss a dose, skip it. Take your next dose at the normal time. Do not take extra or 2 doses at the same time to make up for the missed dose. What may interact with this medication? Medications for seizures like phenytoin, phenobarbital, and carbamazepine Rifabutin Rifampin This list may not describe all possible interactions. Give your health care provider a list of all the medicines, herbs, non-prescription drugs, or dietary supplements you use. Also tell them if you smoke, drink alcohol, or use illegal drugs. Some items may interact with your medicine. What should I watch for while using this medication? Visit your health care provider for regular checks on your progress. Tell your health care provider if your allergy or asthma symptoms do not improve. Take your medication even when you do not have symptoms. If you have asthma, talk to your health care provider about what to do in an acute asthma attack. Always have your rescue medication for asthma attacks with you. Patients and their families should watch for new or worsening thoughts of suicide or depression. Also watch for sudden changes in feelings such as feeling anxious, agitated, panicky, irritable, hostile, aggressive, impulsive, severely restless, overly excited and hyperactive, or not being able to sleep. Any worsening of mood or thoughts of suicide or dying should be reported to your health care provider right away. What side effects may I notice from receiving this medication? Side effects that you should report to your care team as soon as possible: Allergic reactions--skin rash, itching,  hives, swelling of the face, lips, tongue, or throat Flu-like symptoms--fever, chills, muscle pain, cough, headache, fatigue Mood and behavior changes such as anxiety, nervousness, confusion, hallucinations, irritability, hostility, thoughts of suicide or self-harm, worsening mood, feelings of  depression Pain, tingling, or numbness in the hands or feet Sinus pain or pressure around the face or forehead Trouble sleeping Vivid dreams or nightmares Side effects that usually do not require medical attention (report to your care team if they continue or are bothersome): Cough Diarrhea Headache Runny or stuffy nose Sore throat Stomach pain This list may not describe all possible side effects. Call your doctor for medical advice about side effects. You may report side effects to FDA at 1-800-FDA-1088. Where should I keep my medication? Keep out of the reach of children and pets. Store at room temperature between 15 and 30 degrees C (59 and 86 degrees F). Protect from light and moisture. Protect from light and moisture. Keep the container tightly closed. Get rid of any unused medication after the expiration date. To get rid of medications that are no longer needed or expired: Take the medication to a medication take-back program. Check with your pharmacy or law enforcement to find a location. If you cannot return the medication, check the label or package insert to see if the medication should be thrown out in the garbage or flushed down the toilet. If you are not sure, ask your care team. If it is safe to put in the trash, empty the medication out of the container. Mix the medication with cat litter, dirt, coffee grounds, or other unwanted substance. Seal the mixture in a bag or container. Put it in the trash. NOTE: This sheet is a summary. It may not cover all possible information. If you have questions about this medicine, talk to your doctor, pharmacist, or health care provider.  2022 Elsevier/Gold Standard (2020-04-17 00:00:00)

## 2021-02-16 NOTE — Assessment & Plan Note (Signed)
-  Start Singulair 10mg  at bedtime as she had elevated eosinophils on CBC in May 2022 and has some associated allergy symptoms

## 2021-02-16 NOTE — Telephone Encounter (Signed)
Patient would like to change providers to Dr. Annamaria Boots, is that alright with you both?

## 2021-02-16 NOTE — Progress Notes (Signed)
@Patient  ID: Crystal Duarte, female    DOB: [birth year], 60 y.o.   MRN: [id number]  Chief Complaint  Patient presents with   Follow-up    Increased non-productive cough for the past few weeks. Just completed a round of prednisone.  States the Anoro is not working for her.     Referring provider: [name], *  HPI: 60 year old female, current someday smoker (10.5-pack-year history).  Past medical history significant for COPD mixed type, aortic atherosclerosis, multiple pulmonary nodules, bipolar disorder.  Patient of Dr. Melvyn Novas, last seen in office on 05/05/2020.  02/16/2021- Interim hx  Patient presents today for acute office visit. She is a current smoker, reports shortness of breath has been worse the last month. She is really struggling to breath. She was seen by internal medicine on 01/26/21 for video visit d/t acute cough and wheezing. Prescribed levaquin and prednisone taper. Prednisone did help when she took it but caused insomnia symptoms. States that inhalers are no longer working. She is currently on Anoro 1 puff daily. She uses albuterol every 4 hours without much relief. She has tried both trelegy and breztri without improvement. Patient saw cardiology 02/12/2021, they ordered Zio patch x 14 days and cardiac stress test. Cholesterol panel and BNP were normal.    Allergies  Allergen Reactions   Other Nausea And Vomiting    novacaine    Procaine Nausea And Vomiting   Latuda [Lurasidone Hcl] Anxiety   Sulfa Antibiotics Rash    Only in sunlight     Immunization History  Administered Date(s) Administered   Influenza Whole 02/11/2015   Influenza,inj,Quad PF,6+ Mos 03/14/2016, 12/26/2016, 01/09/2018, 01/21/2019, 01/13/2020   Influenza,inj,quad, With Preservative 02/11/2015   Moderna Sars-Covid-2 Vaccination 05/16/2019, 06/12/2019, 03/08/2020   Tdap 10/08/2015   Zoster Recombinat (Shingrix) 01/09/2018, 04/05/2018    Past Medical History:  Diagnosis Date    Coccidioidomycosis, pulmonary ([place])    Depression    Hx of adenomatous polyp of colon 12/04/2014   Major depressive disorder    since 14, electroconvulsive therapy, and transcranial magnetic stimulation recently for 6 weeks every day    Osteopenia 01/2019   T score -2.1 FRAX 9.6% / 2.1% stable from prior DEXA   Panic attack    PONV (postoperative nausea and vomiting)    VAIN I (vaginal intraepithelial neoplasia grade I) 2015   Positive HPV negative subtype 16, 18/45    Tobacco History: Social History   Tobacco Use  Smoking Status Some Days   Packs/day: 0.25   Years: 42.00   Pack years: 10.50   Types: Cigarettes  Smokeless Tobacco Never   Ready to quit: Not Answered Counseling given: Not Answered   Outpatient Medications Prior to Visit  Medication Sig Dispense Refill   albuterol (VENTOLIN HFA) 108 (90 Base) MCG/ACT inhaler Inhale into the lungs as needed.     ANORO ELLIPTA 62.5-25 MCG/ACT AEPB INHALE ONE DOSE BY MOUTH DAILY 60 each 0   busPIRone (BUSPAR) 10 MG tablet 20 mg 3 (three) times daily.      gabapentin (NEURONTIN) 400 MG capsule Take 400 mg by mouth 2 (two) times daily.     gabapentin (NEURONTIN) 600 MG tablet Take 600 mg by mouth daily.      Multiple Vitamin (MULTI-VITAMINS) TABS Take 1 tablet by mouth daily.      omeprazole (PRILOSEC) 20 MG capsule Take 1 capsule (20 mg total) by mouth daily. 90 capsule 3   propranolol (INDERAL) 20 MG tablet Take 1 tablet (20 mg  total) by mouth 3 (three) times daily. 90 tablet 0   rosuvastatin (CRESTOR) 20 MG tablet Take 1 tablet (20 mg total) by mouth daily. 90 tablet 3   traZODone (DESYREL) 100 MG tablet Take 250 mg by mouth at bedtime.     venlafaxine XR (EFFEXOR-XR) 150 MG 24 hr capsule Take 150 mg by mouth daily with breakfast.      venlafaxine XR (EFFEXOR-XR) 37.5 MG 24 hr capsule Take 37.5 mg by mouth daily.     valACYclovir (VALTREX) 1000 MG tablet TAKE ONE TABLET BY MOUTH TWICE A DAY FOR 7 DAYS 180 tablet 0   predniSONE  (DELTASONE) 10 MG tablet 3 tabs by mouth per day for 3 days,2tabs per day for 3 days,1tab per day for 3 days 18 tablet 0   No facility-administered medications prior to visit.    Review of Systems  Review of Systems  Constitutional: Negative.   HENT: Negative.    Respiratory:  Positive for cough and shortness of breath.        NP cough  Cardiovascular:  Positive for palpitations.    Physical Exam  BP 128/82   Pulse 75   Ht 5\' 5"  (1.651 m)   Wt 190 lb (86.2 kg)   SpO2 100% Comment: on RA  BMI 31.62 kg/m  Physical Exam Constitutional:      Appearance: Normal appearance.  HENT:     Head: Normocephalic and atraumatic.     Mouth/Throat:     Mouth: Mucous membranes are moist.     Pharynx: Oropharynx is clear.  Cardiovascular:     Rate and Rhythm: Normal rate and regular rhythm.  Pulmonary:     Effort: Pulmonary effort is normal.     Breath sounds: Normal breath sounds. No wheezing, rhonchi or rales.  Musculoskeletal:        General: Normal range of motion.  Skin:    General: Skin is warm and dry.  Neurological:     General: No focal deficit present.     Mental Status: She is alert and oriented to person, place, and time. Mental status is at baseline.  Psychiatric:        Mood and Affect: Mood normal.        Behavior: Behavior normal.        Thought Content: Thought content normal.        Judgment: Judgment normal.     Lab Results:  CBC    Component Value Date/Time   WBC 11.8 (H) 08/03/2020 1213   RBC 4.58 08/03/2020 1213   HGB 14.6 08/03/2020 1213   HGB 13.4 05/14/2013 1829   HCT 43.2 08/03/2020 1213   HCT 40.4 05/14/2013 1829   PLT 244.0 08/03/2020 1213   PLT 267 05/14/2013 1829   MCV 94.4 08/03/2020 1213   MCV 95 05/14/2013 1829   MCH 31.6 01/27/2020 0940   MCHC 33.7 08/03/2020 1213   RDW 13.9 08/03/2020 1213   RDW 13.3 05/14/2013 1829   LYMPHSABS 3.4 08/03/2020 1213   LYMPHSABS 3.6 05/14/2013 1829   MONOABS 0.6 08/03/2020 1213   MONOABS 0.6  05/14/2013 1829   EOSABS 0.3 08/03/2020 1213   EOSABS 0.2 05/14/2013 1829   BASOSABS 0.1 08/03/2020 1213   BASOSABS 0.1 05/14/2013 1829    BMET    Component Value Date/Time   NA 147 (H) 02/12/2021 1409   NA 136 05/14/2013 1829   K 5.0 02/12/2021 1409   K 3.8 05/14/2013 1829   CL 108 (H) 02/12/2021 1409  CL 98 05/14/2013 1829   CO2 25 02/12/2021 1409   CO2 35 (H) 05/14/2013 1829   GLUCOSE 106 (H) 02/12/2021 1409   GLUCOSE 122 (H) 10/08/2020 1204   GLUCOSE 104 (H) 05/14/2013 1829   BUN 10 02/12/2021 1409   BUN 7 05/14/2013 1829   CREATININE 0.87 02/12/2021 1409   CREATININE 0.83 01/27/2020 0940   CALCIUM 9.3 02/12/2021 1409   CALCIUM 9.1 05/14/2013 1829   GFRNONAA 86 03/02/2020 1553   GFRNONAA >60 05/14/2013 1829   GFRAA 99 03/02/2020 1553   GFRAA >60 05/14/2013 1829    BNP No results found for: BNP  ProBNP    Component Value Date/Time   PROBNP 57 02/12/2021 1409   PROBNP 26.0 12/21/2016 1456    Imaging: No results found.   Assessment & Plan:   COPD  GOLD 2/ still smoking  - Patient reports increased shortness of breath symptoms over the last month. She is currently on Anoro but feels it is no longer working. She has tried both Electronics engineer in the past without significant improvement. Recommend getting repeat PFTs to assess for decline in lung function and CT chest wo contrast d.t worsening symptoms. She was due for annual LDCT imaging in February 2023. FU in 2-4 weeks with Dr. Annamaria Boots (new patient).   Aortic atherosclerosis ([place]) - Currently being worked up by cardiology for dyspnea symptoms, she is scheduled for nuclear stress test in December and received ZIO patch yesterday  Allergic rhinitis -Start Singulair 10mg  at bedtime as she had elevated eosinophils on CBC in May 2022 and has some associated allergy symptoms  40 mins spent on case; >50% face to face  Martyn Ehrich, NP 02/16/2021

## 2021-02-16 NOTE — Assessment & Plan Note (Signed)
-   Currently being worked up by cardiology for dyspnea symptoms, she is scheduled for nuclear stress test in December and received ZIO patch yesterday

## 2021-02-16 NOTE — Assessment & Plan Note (Addendum)
-   Patient reports increased shortness of breath symptoms over the last month. She is currently on Anoro but feels it is no longer working. She has tried both Electronics engineer in the past without significant improvement. Recommend getting repeat PFTs to assess for decline in lung function and CT chest wo contrast d.t worsening symptoms. She was due for annual LDCT imaging in February 2023. FU in 2-4 weeks with Dr. Annamaria Boots (new patient).

## 2021-02-17 NOTE — Telephone Encounter (Signed)
Pt is already set up with appt with CY for 12/22.  Nothing further is needed.

## 2021-02-17 NOTE — Telephone Encounter (Signed)
Crystal Duarte please notify patient of provider change being accepted and schedule her for new patient visit with Dr. Annamaria Boots in 2-4 weeks if not already done

## 2021-02-18 DIAGNOSIS — J441 Chronic obstructive pulmonary disease with (acute) exacerbation: Secondary | ICD-10-CM | POA: Diagnosis not present

## 2021-02-19 ENCOUNTER — Encounter (HOSPITAL_COMMUNITY): Payer: Medicare HMO

## 2021-02-26 DIAGNOSIS — R002 Palpitations: Secondary | ICD-10-CM | POA: Diagnosis not present

## 2021-02-28 ENCOUNTER — Other Ambulatory Visit: Payer: Self-pay | Admitting: Internal Medicine

## 2021-02-28 DIAGNOSIS — E781 Pure hyperglyceridemia: Secondary | ICD-10-CM

## 2021-03-01 ENCOUNTER — Telehealth: Payer: Self-pay | Admitting: Primary Care

## 2021-03-01 DIAGNOSIS — J449 Chronic obstructive pulmonary disease, unspecified: Secondary | ICD-10-CM

## 2021-03-01 DIAGNOSIS — R0602 Shortness of breath: Secondary | ICD-10-CM

## 2021-03-01 NOTE — Telephone Encounter (Signed)
I received the denial this morning.  I am looking to see what I can do - whether a peer to peer needs to be set up or an appeal filed.   Pt has been made aware.

## 2021-03-02 ENCOUNTER — Other Ambulatory Visit: Payer: Self-pay | Admitting: Internal Medicine

## 2021-03-02 ENCOUNTER — Inpatient Hospital Stay: Admission: RE | Admit: 2021-03-02 | Payer: Medicare HMO | Source: Ambulatory Visit

## 2021-03-02 NOTE — Telephone Encounter (Signed)
CT was denied by pt's insurance because they want the patient to complete a CXR first.  I have sent a message to [facility] to request a CXR order & LM on pt's VM.

## 2021-03-02 NOTE — Telephone Encounter (Signed)
Pt has been made aware.  Nothing further needed at this time.  

## 2021-03-02 NOTE — Telephone Encounter (Signed)
CXR ordered, if abnormal we will get CT chest. She is due for LDCT in January 2023.

## 2021-03-03 ENCOUNTER — Ambulatory Visit (INDEPENDENT_AMBULATORY_CARE_PROVIDER_SITE_OTHER): Payer: Medicare HMO

## 2021-03-03 DIAGNOSIS — F331 Major depressive disorder, recurrent, moderate: Secondary | ICD-10-CM | POA: Diagnosis not present

## 2021-03-03 DIAGNOSIS — R0602 Shortness of breath: Secondary | ICD-10-CM | POA: Diagnosis not present

## 2021-03-03 DIAGNOSIS — F411 Generalized anxiety disorder: Secondary | ICD-10-CM | POA: Diagnosis not present

## 2021-03-03 DIAGNOSIS — Z9889 Other specified postprocedural states: Secondary | ICD-10-CM | POA: Diagnosis not present

## 2021-03-03 DIAGNOSIS — J449 Chronic obstructive pulmonary disease, unspecified: Secondary | ICD-10-CM

## 2021-03-03 DIAGNOSIS — R69 Illness, unspecified: Secondary | ICD-10-CM | POA: Diagnosis not present

## 2021-03-03 NOTE — Progress Notes (Signed)
Please let patient know CXR showed no acute process. No sign of pneumothorax, effusion or consolidation. No further advanced imaging recommended. She is due for low dose CT chest in January 2023 I believe, we will plan on having her keep that and if anything abnormal we will follow-up

## 2021-03-04 ENCOUNTER — Telehealth (HOSPITAL_COMMUNITY): Payer: Self-pay | Admitting: Internal Medicine

## 2021-03-04 NOTE — Telephone Encounter (Signed)
Patient called and cancelled Myoview for the reason below:  03/04/2021 12:03 PM TU:[id number], LESLIE O  Cancel Rsn: Patient (pt is sick she will callback when she feels better to r/s)  Order will be removed from the active [place] and when patient calls back to reschedule we will reinstate the order. Thank you

## 2021-03-05 ENCOUNTER — Encounter (HOSPITAL_COMMUNITY): Payer: Medicare HMO

## 2021-03-22 DIAGNOSIS — R69 Illness, unspecified: Secondary | ICD-10-CM | POA: Diagnosis not present

## 2021-03-22 DIAGNOSIS — F331 Major depressive disorder, recurrent, moderate: Secondary | ICD-10-CM | POA: Diagnosis not present

## 2021-03-22 DIAGNOSIS — F411 Generalized anxiety disorder: Secondary | ICD-10-CM | POA: Diagnosis not present

## 2021-03-24 NOTE — Progress Notes (Signed)
03/25/21- 60 yoF Smoker (10.5 pk yrs) transferring to my service for COPD, complicated by COPD mixed type, aortic atherosclerosis, multiple pulmonary nodules, bipolar disorder. Hx Coccidioidomycosis,(lung bx [facility] 2001) Depression,  Blanche East, NP 11/15- acute visit. PCP had> levaquin, pred taper.Cardiology> Zio patch, stress test. - Anoro, Singulair, albuterol hfa, Note propranolol 20 tid,  Covid vax-3 Moderna Flu vax-had She does not think Anoro, Trelegy or Breztri had much effect.  Long family history of smoke exposure-many smokers.  She feels stable dyspnea on exertion with scant white sputum and no acute events. History VATS resection 2001 at [facility] for what was apparently a cocci nodule.  She had lived in [place], [place] in the past. Follows with cardiology-aortic atherosclerosis. We discussed availability of pulmonary rehabilitation and the low-dose chest CT screening program. Discussed pneumococcal vaccine, recommended we get her a nebulizer machine and referred to pharmacy smoking program. PFT 03/25/21-severe obstruction with no response to bronchodilator (FEV1 1.23/46%, FVC 57%), moderate restriction, minimal diffusion defect CXR IMPRESSION: 03/03/21 No acute cardiopulmonary disease. Postoperative changes in the RIGHT upper lobe.  Prior to Admission medications   Medication Sig Start Date End Date Taking? Authorizing Provider  albuterol (VENTOLIN HFA) 108 (90 Base) MCG/ACT inhaler Inhale into the lungs as needed. 01/11/21  Yes [provider]  busPIRone (BUSPAR) 10 MG tablet 20 mg 3 (three) times daily.  01/17/18  Yes [provider]  gabapentin (NEURONTIN) 400 MG capsule Take 400 mg by mouth 2 (two) times daily.   Yes [provider]  gabapentin (NEURONTIN) 600 MG tablet Take 600 mg by mouth daily.  01/07/18  Yes [provider]  ipratropium-albuterol (DUONEB) 0.5-2.5 (3) MG/3ML SOLN Take 3 mLs by nebulization every 6 (six) hours  as needed. 03/25/21  Yes Kaydance Bowie, Tarri Fuller D, MD  montelukast (SINGULAIR) 10 MG tablet Take 1 tablet (10 mg total) by mouth at bedtime. 02/16/21  Yes Martyn Ehrich, NP  Multiple Vitamin (MULTI-VITAMINS) TABS Take 1 tablet by mouth daily.    Yes [provider]  omeprazole (PRILOSEC) 20 MG capsule Take 1 capsule (20 mg total) by mouth daily. 11/24/20  Yes Gatha Mayer, MD  propranolol (INDERAL) 20 MG tablet Take 1 tablet (20 mg total) by mouth 3 (three) times daily. 11/23/17  Yes Clapacs, Madie Reno, MD  rosuvastatin (CRESTOR) 20 MG tablet Take 1 tablet (20 mg total) by mouth daily. 02/15/21  Yes Chandrasekhar, Mahesh A, MD  traZODone (DESYREL) 100 MG tablet Take 250 mg by mouth at bedtime.   Yes [provider]  venlafaxine XR (EFFEXOR-XR) 150 MG 24 hr capsule Take 150 mg by mouth daily with breakfast.    Yes [provider]  venlafaxine XR (EFFEXOR-XR) 37.5 MG 24 hr capsule Take 37.5 mg by mouth daily. 02/18/20  Yes [provider]  Celedonio Miyamoto 62.5-25 MCG/ACT AEPB INHALE ONE DOSE BY MOUTH DAILY 04/07/21   [name], MD    Past Medical History:  Diagnosis Date   Coccidioidomycosis, pulmonary ([place])    Depression    Hx of adenomatous polyp of colon 12/04/2014   Major depressive disorder    since 14, electroconvulsive therapy, and transcranial magnetic stimulation recently for 6 weeks every day    Osteopenia 01/2019   T score -2.1 FRAX 9.6% / 2.1% stable from prior DEXA   Panic attack    PONV (postoperative nausea and vomiting)    VAIN I (vaginal intraepithelial neoplasia grade I) 2015   Positive HPV negative subtype 16, 18/45  Past Surgical History:  Procedure Laterality Date   ABDOMINAL HYSTERECTOMY     TAH BSO   BILATERAL VATS ABLATION     vats for biopsy due to infection   BREAST EXCISIONAL BIOPSY Right    benign   COLONOSCOPY  2001   hemorrhoidectomy   HEMORRHOID SURGERY     LUNG SURGERY  2001   VATS surgery    OVARIAN CYST  REMOVAL  1970's   ULNAR NERVE TRANSPOSITION Left 12/06/2018   Procedure: LEFT ULNAR NERVE DECOMPRESSION,  ;  Surgeon: Leanora Cover, MD;  Location: [facility];  Service: Orthopedics;  Laterality: Left;   VESICOVAGINAL FISTULA CLOSURE W/ TAH  2005   WRIST ARTHROSCOPY Left 03/13/2018   Procedure: ARTHROSCOPY LEFT WRIST WITH DEBRIDEMENT;  Surgeon: Leanora Cover, MD;  Location: [facility];  Service: Orthopedics;  Laterality: Left;  block   Family History  Problem Relation Age of Onset   Colon polyps Mother    Asthma Mother    Ovarian cancer Mother    Varicose Veins Mother    Colon polyps Father    Heart disease Father    Peripheral vascular disease Father    Lung cancer Maternal Grandmother    Colon cancer Neg Hx    Esophageal cancer Neg Hx    Stomach cancer Neg Hx    Pancreatic cancer Neg Hx    Liver disease Neg Hx    Social History   Socioeconomic History   Marital status: Single    Spouse name: Not on file   Number of children: 2   Years of education: Not on file   Highest education level: Not on file  Occupational History   Occupation: Herbalist    Comment: disability    Employer: [employer]  Tobacco Use   Smoking status: Some Days    Packs/day: 0.25    Years: 42.00    Pack years: 10.50    Types: Cigarettes   Smokeless tobacco: Never   Tobacco comments:    1/2 pack a day MRC 03/25/21  Vaping Use   Vaping Use: Never used  Substance and Sexual Activity   Alcohol use: No    Alcohol/week: 0.0 standard drinks   Drug use: No   Sexual activity: Not Currently    Birth control/protection: Surgical    Comment: 1st intercourse 60 yo-More than 5 partners  Other Topics Concern   Not on file  Social History Narrative   Lives with son, daughter in Sports coach and 4 grandchildren    Social Determinants of Health   Financial Resource Strain: Not on file  Food Insecurity: Not on file  Transportation Needs: Not on file  Physical Activity: Not  on file  Stress: Not on file  Social Connections: Not on file  Intimate Partner Violence: Not on file   ROS-see HPI  + = positive Constitutional:    weight loss, night sweats, fevers, chills, fatigue, lassitude. HEENT:    headaches, difficulty swallowing, tooth/dental problems, sore throat,       sneezing, itching, ear ache, nasal congestion, post nasal drip, snoring CV:    chest pain, orthopnea, PND, swelling in lower extremities, anasarca,                                  dizziness, palpitations Resp:   shortness of breath with exertion or at rest.  productive cough,   non-productive cough, coughing up of blood.              change in color of mucus.  wheezing.   Skin:    rash or lesions. GI:  No-   heartburn, indigestion, abdominal pain, nausea, vomiting, diarrhea,                 change in bowel habits, loss of appetite GU: dysuria, change in color of urine, no urgency or frequency.   flank pain. MS:   joint pain, stiffness, decreased range of motion, back pain. Neuro-     nothing unusual Psych:  change in mood or affect.  depression or anxiety.   memory loss. OBJ- Physical Exam General- Alert, Oriented, Affect-appropriate, Distress- none acute Skin- rash-none, lesions- none, excoriation- none Lymphadenopathy- none Head- atraumatic            Eyes- Gross vision intact, PERRLA, conjunctivae and secretions clear            Ears- Hearing, canals-normal            Nose- Clear, no-Septal dev, mucus, polyps, erosion, perforation             Throat- Mallampati II , mucosa clear , drainage- none, tonsils- atrophic Neck- flexible , trachea midline, no stridor , thyroid nl, carotid no bruit Chest - symmetrical excursion , unlabored           Heart/CV- RRR , no murmur , no gallop  , no rub, nl s1 s2                           - JVD- none , edema- none, stasis changes- none, varices- none           Lung- clear to P&A, wheeze- none, cough- none , dullness-none, rub- none            Chest wall-  Abd-  Br/ Gen/ Rectal- Not done, not indicated Extrem- cyanosis- none, clubbing, none, atrophy- none, strength- nl Neuro- grossly intact to observation

## 2021-03-25 ENCOUNTER — Telehealth: Payer: Self-pay | Admitting: Internal Medicine

## 2021-03-25 ENCOUNTER — Encounter: Payer: Self-pay | Admitting: Internal Medicine

## 2021-03-25 ENCOUNTER — Ambulatory Visit (INDEPENDENT_AMBULATORY_CARE_PROVIDER_SITE_OTHER): Payer: Medicare HMO | Admitting: Internal Medicine

## 2021-03-25 ENCOUNTER — Ambulatory Visit: Payer: Medicare HMO | Admitting: Internal Medicine

## 2021-03-25 ENCOUNTER — Other Ambulatory Visit: Payer: Self-pay

## 2021-03-25 VITALS — BP 122/78 | HR 78 | Temp 98.3°F | Ht 65.0 in | Wt 189.0 lb

## 2021-03-25 DIAGNOSIS — J449 Chronic obstructive pulmonary disease, unspecified: Secondary | ICD-10-CM

## 2021-03-25 DIAGNOSIS — Z23 Encounter for immunization: Secondary | ICD-10-CM | POA: Diagnosis not present

## 2021-03-25 DIAGNOSIS — R918 Other nonspecific abnormal finding of lung field: Secondary | ICD-10-CM

## 2021-03-25 DIAGNOSIS — R69 Illness, unspecified: Secondary | ICD-10-CM | POA: Diagnosis not present

## 2021-03-25 DIAGNOSIS — F17210 Nicotine dependence, cigarettes, uncomplicated: Secondary | ICD-10-CM

## 2021-03-25 LAB — PULMONARY FUNCTION TEST
DL/VA % pred: 107 %
DL/VA: 4.49 ml/min/mmHg/L
DLCO cor % pred: 73 %
DLCO cor: 15.42 ml/min/mmHg
DLCO unc % pred: 73 %
DLCO unc: 15.42 ml/min/mmHg
FEF 25-75 Post: 0.78 L/sec
FEF 25-75 Pre: 0.73 L/sec
FEF2575-%Change-Post: 5 %
FEF2575-%Pred-Post: 32 %
FEF2575-%Pred-Pre: 31 %
FEV1-%Change-Post: 3 %
FEV1-%Pred-Post: 48 %
FEV1-%Pred-Pre: 46 %
FEV1-Post: 1.27 L
FEV1-Pre: 1.23 L
FEV1FVC-%Change-Post: -1 %
FEV1FVC-%Pred-Pre: 80 %
FEV6-%Change-Post: 4 %
FEV6-%Pred-Post: 62 %
FEV6-%Pred-Pre: 59 %
FEV6-Post: 2.05 L
FEV6-Pre: 1.96 L
FEV6FVC-%Pred-Post: 103 %
FEV6FVC-%Pred-Pre: 103 %
FVC-%Change-Post: 4 %
FVC-%Pred-Post: 60 %
FVC-%Pred-Pre: 57 %
FVC-Post: 2.05 L
FVC-Pre: 1.96 L
Post FEV1/FVC ratio: 62 %
Post FEV6/FVC ratio: 100 %
Pre FEV1/FVC ratio: 63 %
Pre FEV6/FVC Ratio: 100 %

## 2021-03-25 MED ORDER — IPRATROPIUM-ALBUTEROL 0.5-2.5 (3) MG/3ML IN SOLN: 3.0000 mL | Freq: Four times a day (QID) | RESPIRATORY_TRACT | 12 refills | Status: DC | PRN

## 2021-03-25 NOTE — Patient Instructions (Addendum)
Order- referral to Pulmonary Rehab  dx COPD mixed type  Order- lab - Quantiferon Gold    TB assay  Order lab- alpha 1 antitrypsin assay with phenotype  Keep appointment for low dose screening chest CT  Order- DME for new compressor nebulizer and Duoneb 360 ml,,    1 neb every 6 hours if needed, ref x 12  Order- Prevnar 20 pneumonia vaccine  Try a nasal saline rinse as needed like NeilMed or a Neti pot  Order- referral to pharmacy phone based smoking cessation program

## 2021-03-25 NOTE — Progress Notes (Signed)
Full PFT performed today. °

## 2021-03-25 NOTE — Patient Instructions (Signed)
Full PFT performed today. °

## 2021-03-25 NOTE — Telephone Encounter (Signed)
Order- referral to pharmacy phone based smoking cessation program  Please advise

## 2021-03-25 NOTE — Telephone Encounter (Signed)
This patient came in for an appt today and was asking about scheduled her CT with yall. If you will please reach out to her. Thank you

## 2021-03-26 NOTE — Telephone Encounter (Signed)
Left voicemail for patient to call to schedule LDCT

## 2021-03-27 DIAGNOSIS — J441 Chronic obstructive pulmonary disease with (acute) exacerbation: Secondary | ICD-10-CM | POA: Diagnosis not present

## 2021-03-29 ENCOUNTER — Telehealth: Payer: Self-pay

## 2021-03-29 NOTE — Telephone Encounter (Signed)
Referral pending.

## 2021-03-29 NOTE — Telephone Encounter (Signed)
Patient was contacted for smoking cessation service screening; however, patient was unable to answer the phone and a HIPAA compliant voicemail was left. Will attempt to reach patient at a later time.   Joseph Art, Pharm.D. PGY-1 Pharmacy Resident 03/29/2021 4:05 PM

## 2021-03-29 NOTE — Telephone Encounter (Signed)
error 

## 2021-04-03 LAB — QUANTIFERON-TB GOLD PLUS
Mitogen-NIL: >10 IU/mL
NIL: 0.05 IU/mL
QuantiFERON-TB Gold Plus: NEGATIVE
TB1-NIL: <0 IU/mL
TB2-NIL: 0.01 IU/mL

## 2021-04-03 LAB — ALPHA-1 ANTITRYPSIN PHENOTYPE: A-1 Antitrypsin, Ser: 144 mg/dL (ref 83–199)

## 2021-04-04 ENCOUNTER — Other Ambulatory Visit: Payer: Self-pay | Admitting: Internal Medicine

## 2021-04-12 ENCOUNTER — Telehealth: Payer: Self-pay

## 2021-04-12 NOTE — Telephone Encounter (Signed)
Patient was contacted regarding initial screening for smoking cessation telephone-based service. However, patient endorsed that now is not a good time as her mother is in the hospital. Ms. Barkan informed me that she will give me a call once her mother gets better.   Joseph Art, Pharm.D. PGY-1 Pharmacy Resident 04/12/2021 2:50 PM

## 2021-04-13 ENCOUNTER — Other Ambulatory Visit: Payer: Self-pay | Admitting: *Deleted

## 2021-04-13 ENCOUNTER — Encounter (HOSPITAL_COMMUNITY): Payer: Self-pay | Admitting: *Deleted

## 2021-04-13 DIAGNOSIS — Z122 Encounter for screening for malignant neoplasm of respiratory organs: Secondary | ICD-10-CM

## 2021-04-13 DIAGNOSIS — F17210 Nicotine dependence, cigarettes, uncomplicated: Secondary | ICD-10-CM

## 2021-04-13 NOTE — Progress Notes (Signed)
Received referral from Dr. Annamaria Boots for this pt to participate in pulmonary rehab with the diagnosis of COPD Stage 3.  Pt had full PFT completed on 03/2021 which showed FEV1/FVC 62 and FEV1 post pred 48. Clinical review of pt follow up appt on 03/25/21 Pulmonary office note.  Also reviewed cardiology follow up with Dr Gasper Sells.  Pt was scheduled for 12/2 to have lexiscan completed.  Pt unable to complete this procedure due to not feeling well.  Has not rescheduled this appt.  Will see Dr. Gasper Sells on 2/15.  Because pulmonary rehab is non telemetry, would like for pt be medically stable from cardiology standpoint prior.  Will place this pt on the wait list while awaiting next follow up with the cardiologist. Pt with Covid Risk Score - 3. Pt appropriate for scheduling for Pulmonary rehab.  Will forward to support staff for scheduling and verification of insurance eligibility/benefits with pt consent. Cherre Huger, BSN Cardiac and Training and development officer

## 2021-04-14 NOTE — Telephone Encounter (Signed)
CT has been scheduled 

## 2021-04-15 ENCOUNTER — Encounter: Payer: Self-pay | Admitting: Internal Medicine

## 2021-04-15 NOTE — Assessment & Plan Note (Signed)
She is referred to pharmacy telephone-based smoking cessation program

## 2021-04-15 NOTE — Assessment & Plan Note (Signed)
She has not responded well to combination bronchodilators. Plan-nebulizer machine with DuoNeb, alpha-1 antitrypsin level, continue screening CT chest low-dose, pulmonary rehab.

## 2021-04-15 NOTE — Assessment & Plan Note (Signed)
Not aware of any TB exposure. Plan-QuantiFERON TB Gold assay

## 2021-04-20 ENCOUNTER — Other Ambulatory Visit: Payer: Self-pay

## 2021-04-20 ENCOUNTER — Ambulatory Visit (INDEPENDENT_AMBULATORY_CARE_PROVIDER_SITE_OTHER): Payer: Medicare HMO | Admitting: Adult Health

## 2021-04-20 ENCOUNTER — Encounter: Payer: Self-pay | Admitting: Adult Health

## 2021-04-20 VITALS — BP 139/74 | HR 69 | Ht 64.5 in | Wt 184.0 lb

## 2021-04-20 DIAGNOSIS — F331 Major depressive disorder, recurrent, moderate: Secondary | ICD-10-CM

## 2021-04-20 DIAGNOSIS — R69 Illness, unspecified: Secondary | ICD-10-CM | POA: Diagnosis not present

## 2021-04-20 DIAGNOSIS — F5105 Insomnia due to other mental disorder: Secondary | ICD-10-CM | POA: Diagnosis not present

## 2021-04-20 DIAGNOSIS — F99 Mental disorder, not otherwise specified: Secondary | ICD-10-CM | POA: Diagnosis not present

## 2021-04-20 DIAGNOSIS — F411 Generalized anxiety disorder: Secondary | ICD-10-CM

## 2021-04-20 MED ORDER — RISPERIDONE 1 MG PO TABS: ORAL_TABLET | ORAL | 2 refills | Status: DC

## 2021-04-20 NOTE — Progress Notes (Signed)
[facility] MD/PA/NP Initial Note  04/20/2021 6:02 PM Crystal Duarte  MRN:  [id number]  Chief Complaint:   HPI:   Referred by daughter.  Describes mood today as "not the best". Pleasant. Tearful at times. Mood symptoms - reports depression, anxiety, and irritability. Stating "I'm in a very bad way right now,  I'm in a funk". Reporting a long battle with mental illness to include multiple hospitalizations, multiple medication trials, ECT, TMS, and CBT. Currently working with a therapist and feels it is helpful. Does not feel ike current medications are working well. Willing to consider alternative options. Will have records from previous provider as well as the records for [facility] testing. Stating "I'm willing to try something new. Stating "I want to fel better". Lives alone, but has a dog for emotional support. Feeling self conscious about her appearence. Hair falling out over the years. 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 Had a job at [employer] that she enjoyed, but eventually had to quit - busy store - felt like things were closing in on her. Reports paranoia at times - more so surrounding her neighbor. Jamesetta Orleans at [facility] levels lower. Active, does not have a regular exercise routine.  Enjoys some usual interests and activities. Spending time with family. Daughter - Son and 7 grandchildren. Appetite adequate - eats once a day. Weight stable 189 pounds. Sleep is adequate. Averages 8 hours of broken sleep. Focus and concentration stable. Completing tasks. Managing aspects of household. Receives SSI disability (2016). Denies SI or HI.  Denies AH or VH.    Previous medication trials:  Clonazepam, others to be sent from current provider   Visit Diagnosis:    ICD-10-CM   1. Generalized anxiety disorder  F41.1     2. Major depressive disorder, recurrent episode, moderate (HCC)  F33.1 risperiDONE (RISPERDAL) 1 MG tablet    3.  Insomnia due to other mental disorder  F51.05    F99       Past Psychiatric History: Hospitalized multiple times over the years.   Past Medical History:  Past Medical History:  Diagnosis Date   Coccidioidomycosis, pulmonary ([place])    Depression    Hx of adenomatous polyp of colon 12/04/2014   Major depressive disorder    since 14, electroconvulsive therapy, and transcranial magnetic stimulation recently for 6 weeks every day    Osteopenia 01/2019   T score -2.1 FRAX 9.6% / 2.1% stable from prior DEXA   Panic attack    PONV (postoperative nausea and vomiting)    VAIN I (vaginal intraepithelial neoplasia grade I) 2015   Positive HPV negative subtype 16, 18/45    Past Surgical History:  Procedure Laterality Date   ABDOMINAL HYSTERECTOMY     TAH BSO   BILATERAL VATS ABLATION     vats for biopsy due to infection   BREAST EXCISIONAL BIOPSY Right    benign   COLONOSCOPY  2001   hemorrhoidectomy   HEMORRHOID SURGERY     LUNG SURGERY  2001   VATS surgery    OVARIAN CYST REMOVAL  1970's   ULNAR NERVE TRANSPOSITION Left 12/06/2018   Procedure: LEFT ULNAR NERVE DECOMPRESSION,  ;  Surgeon: Leanora Cover, MD;  Location: [facility];  Service: Orthopedics;  Laterality: Left;   VESICOVAGINAL FISTULA CLOSURE W/ TAH  2005   WRIST ARTHROSCOPY Left 03/13/2018   Procedure: ARTHROSCOPY LEFT WRIST WITH DEBRIDEMENT;  Surgeon: Leanora Cover, MD;  Location: [facility];  Service: Orthopedics;  Laterality: Left;  block    Family Psychiatric History: Family history of mental illness.   Family History:  Family History  Problem Relation Age of Onset   Colon polyps Mother    Asthma Mother    Ovarian cancer Mother    Varicose Veins Mother    Colon polyps Father    Heart disease Father    Peripheral vascular disease Father    Lung cancer Maternal Grandmother    Colon cancer Neg Hx    Esophageal cancer Neg Hx    Stomach cancer Neg Hx    Pancreatic cancer Neg Hx     Liver disease Neg Hx     Social History:  Social History   Socioeconomic History   Marital status: Divorced    Spouse name: Not on file   Number of children: 2   Years of education: Not on file   Highest education level: Not on file  Occupational History   Occupation: Herbalist    Comment: disability    Employer: [employer]  Tobacco Use   Smoking status: Some Days    Packs/day: 0.25    Years: 42.00    Pack years: 10.50    Types: Cigarettes   Smokeless tobacco: Never   Tobacco comments:    1/2 pack a day MRC 03/25/21  Vaping Use   Vaping Use: Never used  Substance and Sexual Activity   Alcohol use: No    Alcohol/week: 0.0 standard drinks   Drug use: No   Sexual activity: Not Currently    Birth control/protection: Surgical    Comment: 1st intercourse 61 yo-More than 5 partners  Other Topics Concern   Not on file  Social History Narrative   Lives with son, daughter in Sports coach and 4 grandchildren    Social Determinants of Health   Financial Resource Strain: Not on file  Food Insecurity: Not on file  Transportation Needs: Not on file  Physical Activity: Not on file  Stress: Not on file  Social Connections: Not on file    Allergies:  Allergies  Allergen Reactions   Other Nausea And Vomiting    novacaine    Procaine Nausea And Vomiting   Latuda [Lurasidone Hcl] Anxiety   Sulfa Antibiotics Rash    Only in sunlight     Metabolic Disorder Labs: Lab Results  Component Value Date   HGBA1C 5.9 10/16/2020   MPG 117 01/27/2020   MPG 116.89 11/18/2017   No results found for: PROLACTIN Lab Results  Component Value Date   CHOL 126 02/12/2021   TRIG 81 02/12/2021   HDL 56 02/12/2021   CHOLHDL 2.3 02/12/2021   VLDL 46.2 (H) 10/08/2020   LDLCALC 54 02/12/2021   LDLCALC 89 01/27/2020   Lab Results  Component Value Date   TSH 2.41 10/08/2020   TSH 3.15 01/27/2020    Therapeutic Level Labs: No results found for: LITHIUM No results found for:  VALPROATE No components found for:  CBMZ  Current Medications: Current Outpatient Medications  Medication Sig Dispense Refill   risperiDONE (RISPERDAL) 1 MG tablet Take 1/2 tablet at bedtime for 7 nights, then increase to 1mg  at bedtime. 30 tablet 2   albuterol (VENTOLIN HFA) 108 (90 Base) MCG/ACT inhaler Inhale into the lungs as needed.     ANORO ELLIPTA 62.5-25 MCG/ACT AEPB INHALE ONE DOSE BY MOUTH DAILY 60 each 0   busPIRone (BUSPAR) 10 MG tablet 20 mg 3 (three) times daily.  gabapentin (NEURONTIN) 400 MG capsule Take 400 mg by mouth 2 (two) times daily.     gabapentin (NEURONTIN) 600 MG tablet Take 600 mg by mouth daily.      ipratropium-albuterol (DUONEB) 0.5-2.5 (3) MG/3ML SOLN Take 3 mLs by nebulization every 6 (six) hours as needed. 360 mL 12   montelukast (SINGULAIR) 10 MG tablet Take 1 tablet (10 mg total) by mouth at bedtime. 30 tablet 11   Multiple Vitamin (MULTI-VITAMINS) TABS Take 1 tablet by mouth daily.      omeprazole (PRILOSEC) 20 MG capsule Take 1 capsule (20 mg total) by mouth daily. 90 capsule 3   propranolol (INDERAL) 20 MG tablet Take 1 tablet (20 mg total) by mouth 3 (three) times daily. 90 tablet 0   rosuvastatin (CRESTOR) 20 MG tablet Take 1 tablet (20 mg total) by mouth daily. 90 tablet 3   traZODone (DESYREL) 100 MG tablet Take 250 mg by mouth at bedtime.     venlafaxine XR (EFFEXOR-XR) 150 MG 24 hr capsule Take 150 mg by mouth daily with breakfast.      venlafaxine XR (EFFEXOR-XR) 37.5 MG 24 hr capsule Take 37.5 mg by mouth daily.     No current facility-administered medications for this visit.    Medication Side Effects: none  Orders placed this visit:  No orders of the defined types were placed in this encounter.   Psychiatric Specialty Exam:  Review of Systems  Musculoskeletal:  Negative for gait problem.  Neurological:  Negative for tremors.  Psychiatric/Behavioral:         Please refer to HPI   Blood pressure 139/74, pulse 69, height 5' 4.5"  (1.638 m), weight 184 lb (83.5 kg).Body mass index is 31.1 kg/m.  General Appearance: Casual and Neat  Eye Contact:  Good  Speech:  Clear and Coherent and Normal Rate  Volume:  Normal  Mood:  Euthymic  Affect:  Appropriate and Congruent  Thought Process:  Coherent and Descriptions of Associations: Intact  Orientation:  Full (Time, Place, and Person)  Thought Content: Logical   Suicidal Thoughts:  No  Homicidal Thoughts:  No  Memory:  WNL  Judgement:  Good  Insight:  Good  Psychomotor Activity:  Normal  Concentration:  Concentration: Good  Recall:  Good  Fund of Knowledge: Good  Language: Good  Assets:  Communication Skills Desire for Improvement Financial Resources/Insurance Housing Intimacy Leisure Time Physical Health Resilience Social Support Talents/Skills Transportation Vocational/Educational  ADL's:  Intact  Cognition: WNL  Prognosis:  Good   Screenings:  AIMS    Flowsheet Row Admission (Discharged) from 11/16/2017 in [facility] Admission (Discharged) from 07/18/2017 in [facility]  AIMS Total Score 0 0      AUDIT    Flowsheet Row Admission (Discharged) from 11/16/2017 in [facility] Admission (Discharged) from 07/18/2017 in [facility]  Alcohol Use Disorder Identification Test Final Score (AUDIT) 0 0      ECT-MADRS    Flowsheet Row Admission (Discharged) from 11/16/2017 in [facility] Total Score 34      GAD-7    [place] Office Visit from 01/21/2019 in [facility] Office Visit from 01/09/2018 in [facility] from 07/28/2016 in [facility]  Total GAD-7 Score 6 6 18       Mini-Mental    Flowsheet Row Admission (Discharged) from 11/16/2017 in [facility]  MEDICINE  Total Score (max 30 points ) 30       PHQ2-9    Flowsheet Row Office Visit from 02/10/2020 in [place] [facility] at [facility] Visit from 01/21/2019 in [facility] Office Visit from 01/09/2018 in [facility] Visit from 12/14/2016 in [facility] from 07/28/2016 in [facility]  PHQ-2 Total Score 4 1 1 1 3   PHQ-9 Total Score 8 10 8  -- 18      Flowsheet Row Admission (Discharged) from 11/16/2017 in [facility] Admission (Discharged) from 11/03/2017 in [facility] ED from 11/02/2017 in [place] DEPT  C-SSRS RISK CATEGORY High Risk High Risk High Risk       Receiving Psychotherapy: Yes   Treatment Plan/Recommendations:  Plan:  Trazadone 250mg  at bedtime Buspar 10mg  - 20mg  TID Propranolol 10mg  TID Effexor XR 150mg  daily Effexor XR 37.5 daily Gabapentin 400mg  twice daily and 600mg  at bedtime Add Risperdal 1mg  - take 1/2 tablet at bedtime for 7 nights, then increase to 1mg  at bedtime - depression, anxiety, paranoia.  Sign release to get records from [facility]  Continue therapy  Consider Sprav[place]  Discussed potential metabolic side effects associated with atypical antipsychotics, as well as potential risk for movement side effects. Advised pt to contact office if movement side effects occur.    RTC 4 weeks   Time spent with patient was 60 minutes. Greater than 50% of face to face time with patient was spent on counseling and coordination of care.      Aloha Gell, NP

## 2021-04-22 ENCOUNTER — Ambulatory Visit (HOSPITAL_BASED_OUTPATIENT_CLINIC_OR_DEPARTMENT_OTHER): Payer: Medicare HMO | Admitting: Psychiatry

## 2021-04-22 ENCOUNTER — Other Ambulatory Visit: Payer: Self-pay

## 2021-04-22 ENCOUNTER — Encounter (HOSPITAL_COMMUNITY): Payer: Self-pay | Admitting: Psychiatry

## 2021-04-22 VITALS — BP 123/73 | HR 74 | Temp 98.7°F | Ht 64.5 in | Wt 185.0 lb

## 2021-04-22 DIAGNOSIS — F5105 Insomnia due to other mental disorder: Secondary | ICD-10-CM | POA: Diagnosis not present

## 2021-04-22 DIAGNOSIS — F99 Mental disorder, not otherwise specified: Secondary | ICD-10-CM | POA: Diagnosis not present

## 2021-04-22 DIAGNOSIS — F411 Generalized anxiety disorder: Secondary | ICD-10-CM | POA: Diagnosis not present

## 2021-04-22 DIAGNOSIS — J449 Chronic obstructive pulmonary disease, unspecified: Secondary | ICD-10-CM

## 2021-04-22 DIAGNOSIS — F331 Major depressive disorder, recurrent, moderate: Secondary | ICD-10-CM | POA: Diagnosis not present

## 2021-04-22 DIAGNOSIS — I700 Atherosclerosis of aorta: Secondary | ICD-10-CM | POA: Diagnosis not present

## 2021-04-22 DIAGNOSIS — R69 Illness, unspecified: Secondary | ICD-10-CM | POA: Diagnosis not present

## 2021-04-22 NOTE — Progress Notes (Signed)
Established Patient Office Visit  Subjective:  Patient ID: Crystal Duarte, female    DOB: Aug 12, 1960  Age: 61 y.o. MRN: [id number]  CC:  Chief Complaint  Patient presents with   [place] Consult    HPI Crystal Duarte presents for who presents for [place] evaluation.  Patient reports that she has had [place] in the past with benefit.  She also says she tried ECT most recently in August 2019, was unable to have treatment because of her having poor IV access.  Patient has that she wants to try [place] again as she did better on it in the past.  Patient reports that she struggles with depression, feels that she is slowed down, does not spend time with her grandkids as she does not want them to see her in her current condition.  Patient adds that on a scale of 0-10, with 0 being no symptoms and 10 being the worst, her depression is a 10 out of 10, and on the same scale high anxiety is an 8 out of 10 as she worries about her grandkids with think of her, reports that her kids are supportive.  She also reports that has food stamps are decreased, she is struggling financially, does get disability, feels that she has not achieved anything in life.  On being questioned of family support, patient's states that her daughter and son are supportive and she has 7 grandkids.  Patient states that she has had multiple previous psychiatric admissions, her last one was in August 2019.  She denies any psychiatric admissions since then.  Does report history of overdose, states that she has had suicidal thoughts for almost a year now, but denies any intent or plan.  She states that she wants to be there for her grandkids, wants her mood to improve, wants to get [place] and continue seeing her therapist and outpatient provider.  She has that she likes her therapist at [facility].  And her new NP at [facility].  Patient denies any psychotic symptoms, reports poor appetite, decreased energy, feeling hopeless and helpless.  She also  reports disturbed sleep, difficulty in concentration, lack of socialization as she feels people will see that she is slowing down.  She denies any symptoms of mania, any racing thoughts but does report anxiety she worries about her financial situation, her grandkids thinking that she is not doing well enough though she does report that her kids are supportive and always available for her.  Patient reports that she has had on and off depression since age 64, reports that it is worsened over the past year or 2 to the point where she feels her psychotropic medications are not working.  She reports that she has tried Trintellix, Cymbalta, Celexa, Wellbutrin, Seroquel, and is currently on BuSpar, Effexor, trazodone, and was recently started on risperidone.  She reports it is helping with her sleep but makes her zombielike in the mornings.  She states that she is getting used to the medication but has had no side effects on it.  On being questioned if any of these medications have helped with her mood, patient denies this.  Patient's PHQ 9 score was 24, also [place] rating scale was done and patient has no plan or intent.  Patient also reports that she is eating 1 meal a day.  Reports decreased appetite as but no weight loss.  Past psychiatric history.  As mentioned above patient's last psychiatric hospitalization was 11/21/2017, patient reports that she has had  multiple previous hospitalizations, has been able to keep her self safe even with her depression due to having a good social support with her kids and grandkids.  Patient currently contracts for safety.  The past psychotropic medications are as listed above which include Trintellix, Cymbalta, Celexa, Wellbutrin, Seroquel.  Patient reports that she has had ECT in the past, her last ECT was at [facility], in August 2019, she was not able to complete it because of lack of poor venous access.  She also reports that she has had TMS to 3 times in the past  with benefit.  Past medical history, agree with the past medical history listed below.  Social history.  Patient is disability and food stamps.  She lives by herself, her kids live close to her, provide support and she has 7 grandkids.  Past Medical History:  Diagnosis Date   Coccidioidomycosis, pulmonary ([place])    Depression    Hx of adenomatous polyp of colon 12/04/2014   Major depressive disorder    since 14, electroconvulsive therapy, and transcranial magnetic stimulation recently for 6 weeks every day    Osteopenia 01/2019   T score -2.1 FRAX 9.6% / 2.1% stable from prior DEXA   Panic attack    PONV (postoperative nausea and vomiting)    VAIN I (vaginal intraepithelial neoplasia grade I) 2015   Positive HPV negative subtype 16, 18/45    Past Surgical History:  Procedure Laterality Date   ABDOMINAL HYSTERECTOMY     TAH BSO   BILATERAL VATS ABLATION     vats for biopsy due to infection   BREAST EXCISIONAL BIOPSY Right    benign   COLONOSCOPY  2001   hemorrhoidectomy   HEMORRHOID SURGERY     LUNG SURGERY  2001   VATS surgery    OVARIAN CYST REMOVAL  1970's   ULNAR NERVE TRANSPOSITION Left 12/06/2018   Procedure: LEFT ULNAR NERVE DECOMPRESSION,  ;  Surgeon: Leanora Cover, MD;  Location: [facility];  Service: Orthopedics;  Laterality: Left;   VESICOVAGINAL FISTULA CLOSURE W/ TAH  2005   WRIST ARTHROSCOPY Left 03/13/2018   Procedure: ARTHROSCOPY LEFT WRIST WITH DEBRIDEMENT;  Surgeon: Leanora Cover, MD;  Location: [facility];  Service: Orthopedics;  Laterality: Left;  block    Family History  Problem Relation Age of Onset   Colon polyps Mother    Asthma Mother    Ovarian cancer Mother    Varicose Veins Mother    Colon polyps Father    Heart disease Father    Peripheral vascular disease Father    Lung cancer Maternal Grandmother    Colon cancer Neg Hx    Esophageal cancer Neg Hx    Stomach cancer Neg Hx    Pancreatic cancer Neg Hx    Liver  disease Neg Hx     Social History   Socioeconomic History   Marital status: Divorced    Spouse name: Not on file   Number of children: 2   Years of education: Not on file   Highest education level: Not on file  Occupational History   Occupation: Herbalist    Comment: disability    Employer: [employer]  Tobacco Use   Smoking status: Some Days    Packs/day: 0.25    Years: 42.00    Pack years: 10.50    Types: Cigarettes   Smokeless tobacco: Never   Tobacco comments:    1/2 pack a day [facility] 03/25/21  Vaping  Use   Vaping Use: Never used  Substance and Sexual Activity   Alcohol use: No    Alcohol/week: 0.0 standard drinks   Drug use: No   Sexual activity: Not Currently    Birth control/protection: Surgical    Comment: 1st intercourse 61 yo-More than 5 partners  Other Topics Concern   Not on file  Social History Narrative   Lives with son, daughter in Sports coach and 4 grandchildren    Social Determinants of Health   Financial Resource Strain: Not on file  Food Insecurity: Not on file  Transportation Needs: Not on file  Physical Activity: Not on file  Stress: Not on file  Social Connections: Not on file  Intimate Partner Violence: Not on file    Outpatient Medications Prior to Visit  Medication Sig Dispense Refill   albuterol (VENTOLIN HFA) 108 (90 Base) MCG/ACT inhaler Inhale into the lungs as needed.     ANORO ELLIPTA 62.5-25 MCG/ACT AEPB INHALE ONE DOSE BY MOUTH DAILY 60 each 0   busPIRone (BUSPAR) 10 MG tablet 20 mg 3 (three) times daily.      FLUBLOK QUADRIVALENT 0.5 ML injection      gabapentin (NEURONTIN) 400 MG capsule Take 400 mg by mouth 2 (two) times daily.     gabapentin (NEURONTIN) 600 MG tablet Take 600 mg by mouth daily.      ipratropium-albuterol (DUONEB) 0.5-2.5 (3) MG/3ML SOLN Take 3 mLs by nebulization every 6 (six) hours as needed. 360 mL 12   montelukast (SINGULAIR) 10 MG tablet Take 1 tablet (10 mg total) by mouth at bedtime. 30 tablet 11    Multiple Vitamin (MULTI-VITAMINS) TABS Take 1 tablet by mouth daily.      omeprazole (PRILOSEC) 20 MG capsule Take 1 capsule (20 mg total) by mouth daily. 90 capsule 3   propranolol (INDERAL) 10 MG tablet Take 10 mg by mouth 3 (three) times daily.     risperiDONE (RISPERDAL) 1 MG tablet Take 1/2 tablet at bedtime for 7 nights, then increase to 35m at bedtime. 30 tablet 2   rosuvastatin (CRESTOR) 20 MG tablet Take 1 tablet (20 mg total) by mouth daily. 90 tablet 3   traZODone (DESYREL) 100 MG tablet Take 250 mg by mouth at bedtime.     venlafaxine XR (EFFEXOR-XR) 150 MG 24 hr capsule Take 150 mg by mouth daily with breakfast.      venlafaxine XR (EFFEXOR-XR) 37.5 MG 24 hr capsule Take 37.5 mg by mouth daily.     propranolol (INDERAL) 20 MG tablet Take 1 tablet (20 mg total) by mouth 3 (three) times daily. 90 tablet 0   No facility-administered medications prior to visit.    Allergies  Allergen Reactions   Other Nausea And Vomiting    novacaine    Procaine Nausea And Vomiting   Latuda [Lurasidone Hcl] Anxiety   Sulfa Antibiotics Rash    Only in sunlight    [facility] 04/22/2021 02/10/2020 01/21/2019 01/09/2018 12/14/2016  Decreased Interest 3 2 0 0 0  Down, Depressed, Hopeless _0 PHQ - 2 Score _1 Altered sleeping _2 -  Tired, decreased energy 3 0 1 1 -  Change in appetite 3 0 2 1 -  Feeling bad or failure about yourself  3 - 1 1 -  Trouble concentrating 2 0 2 1 -  Moving slowly or fidgety/restless _3 -  Suicidal thoughts 2 0 0  0 -  PHQ-9 Score _0 -  Some recent data might be hidden     ROS Review of Systems ROS review of 14 systems is negative except for pertinent positives history of presenting illness   Objective:    Physical Exam  BP 123/73    Pulse 74    Temp 98.7 F (37.1 C)    Ht 5' 4.5" (1.638 m)    Wt 83.9 kg    SpO2 94%    BMI 31.26 kg/m  Wt Readings from Last 3 Encounters:  04/22/21 83.9 kg  04/20/21 83.5 kg   03/25/21 85.7 kg     Health Maintenance Due  Topic Date Due   HIV Screening  Never done   COVID-19 Vaccine (4 - Booster for Moderna series) 05/03/2020    There are no preventive care reminders to display for this patient.  Lab Results  Component Value Date   TSH 2.41 10/08/2020   Lab Results  Component Value Date   WBC 11.8 (H) 08/03/2020   HGB 14.6 08/03/2020   HCT 43.2 08/03/2020   MCV 94.4 08/03/2020   PLT 244.0 08/03/2020   Lab Results  Component Value Date   NA 147 (H) 02/12/2021   K 5.0 02/12/2021   CO2 25 02/12/2021   GLUCOSE 106 (H) 02/12/2021   BUN 10 02/12/2021   CREATININE 0.87 02/12/2021   BILITOT 0.3 02/12/2021   ALKPHOS 92 02/12/2021   AST 16 02/12/2021   ALT 19 02/12/2021   PROT 5.9 (L) 02/12/2021   ALBUMIN 4.2 02/12/2021   CALCIUM 9.3 02/12/2021   ANIONGAP 11 03/05/2018   EGFR 76 02/12/2021   GFR 56.15 (L) 10/08/2020   Lab Results  Component Value Date   CHOL 126 02/12/2021   Lab Results  Component Value Date   HDL 56 02/12/2021   Lab Results  Component Value Date   LDLCALC 54 02/12/2021   Lab Results  Component Value Date   TRIG 81 02/12/2021   Lab Results  Component Value Date   CHOLHDL 2.3 02/12/2021   Lab Results  Component Value Date   HGBA1C 5.9 10/16/2020      Assessment & Plan:  Patient is a 61 year old female, referred by her outpatient psychiatric provider for [place].  Patient reports that she has been struggling with worsening of depression for the past year, with no benefit with medication management.  Patient also reports that she is sees a therapist on a regular basis, has had benefit with [place] in the past, has not authorized through Medicare and she would like to try [place] again as she is not a candidate for ECT.  Patient meets all the criteria for major depressive disorder recurrent, severe, without psychotic features and based on her response to [place] in the past, would benefit from the treatment.  Also crisis and safety  planning done in length with patient she has history of chronic suicidal ideation with no plan or intent, has good social supports, sees a therapist regularly and her children are supportive.  Major depressive disorder, recurrent severe: To continue Effexor XR 35.5 mg 1 in the morning along with 150 mg 1 in in the morning for major depressive disorder To continue risperidone 0.5 mg 1 at bedtime for the next few days and then increase to 1 mg at bedtime. To continue Neurontin 400 mg twice daily and 600 mg at night for mood stabilization  Generalized anxiety disorder: The Effexor XR and Neurontin will also help with  anxiety To continue BuSpar 20 mg 3 times daily to help with generalized anxiety disorder.  Insomnia: To continue trazodone to 50 mg at bedtime to help with sleep.  Patient is also on risperidone which will also help augment patient's sleep along with mood stabilization   50% of this visit was spent in obtaining history, discussing patient's cognitive disorder torsions in regards to her depression, working on coping mechanisms, along with doing a PHQ-9, a [place] suicide risk assessment along with extensive safety planning.  Also discussed treatment option in regards to patient's depression and anxiety, patient would benefit from [place], is not a candidate for ECT based on her poor IV access.  Also discussed in length the need for patient to continue in therapy, work on her positive skills, work on her spending time with her family as she does get joy from it especially with her grandkids.  This was a 40-minute appointment, patient's chart was reviewed in extent, had complex medical decision making based on patient's previous history, her suicidal ideation, the need for safety planning along with helping patient with some coping skills in regards to her depression and feeling of hopelessness.  Follow-up:     Hampton Abbot, MD

## 2021-04-27 DIAGNOSIS — F411 Generalized anxiety disorder: Secondary | ICD-10-CM | POA: Diagnosis not present

## 2021-04-27 DIAGNOSIS — R69 Illness, unspecified: Secondary | ICD-10-CM | POA: Diagnosis not present

## 2021-04-27 DIAGNOSIS — F333 Major depressive disorder, recurrent, severe with psychotic symptoms: Secondary | ICD-10-CM | POA: Diagnosis not present

## 2021-04-29 ENCOUNTER — Ambulatory Visit (HOSPITAL_BASED_OUTPATIENT_CLINIC_OR_DEPARTMENT_OTHER): Payer: Medicare HMO | Admitting: Psychiatry

## 2021-04-29 ENCOUNTER — Ambulatory Visit (INDEPENDENT_AMBULATORY_CARE_PROVIDER_SITE_OTHER)
Admission: RE | Admit: 2021-04-29 | Discharge: 2021-04-29 | Disposition: A | Discharge status: Home / Self Care | Payer: Medicare HMO | Source: Ambulatory Visit | Attending: Cardiology | Admitting: Cardiology

## 2021-04-29 ENCOUNTER — Other Ambulatory Visit: Payer: Self-pay

## 2021-04-29 DIAGNOSIS — F332 Major depressive disorder, recurrent severe without psychotic features: Secondary | ICD-10-CM | POA: Diagnosis not present

## 2021-04-29 DIAGNOSIS — F17210 Nicotine dependence, cigarettes, uncomplicated: Secondary | ICD-10-CM | POA: Diagnosis not present

## 2021-04-29 DIAGNOSIS — R69 Illness, unspecified: Secondary | ICD-10-CM | POA: Diagnosis not present

## 2021-04-29 DIAGNOSIS — Z87891 Personal history of nicotine dependence: Secondary | ICD-10-CM | POA: Diagnosis not present

## 2021-04-29 DIAGNOSIS — Z122 Encounter for screening for malignant neoplasm of respiratory organs: Secondary | ICD-10-CM

## 2021-04-29 NOTE — Progress Notes (Signed)
Pt reported to [facility] for cortical mapping and motor threshold determination for Repetitive Transcranial Magnetic Stimulation treatment for Major Depressive Disorder. Pt completed a PHQ-9 with a score of 16 (moderate severe depression). Pt also completed a Beck's Depression Inventory with a score of 34 (severe depression). Prior to procedure, pt signed an informed consent agreement for [place] treatment. Pt's treatment area was found by applying single pulses to her left motor cortex, hunting along the anterior/posterior plane and along the superior oblique angle until the best motor response was elicited from the pt's right thumb. The best response was observed at 2.8 cm A/P and 30 degrees SOA, with a coil angle of +10 degrees. Pt's motor threshold was calculated using the [employer]'s proprietary MT Assist algorithm, which produced a calculated motor threshold of 1.26 SMT. Per these findings, pt's treatment parameters are as follows: A/P -- 8.3 cm, SOA -- 30 degrees, Coil Angle --  +10 degrees, Motor Threshold -- 1.23 SMT. With these parameters, the pt will receive 36 sessions of TMS according to the following protocol: 3000 pulses per session, with stimulation in bursts of pulses lasting 4 seconds at a frequency of 10 Hz, separated by 11 seconds of rest. After determining pt's tx parameters, coil was moved to the treatment location, and the first burst of pulses was applied at a reduced power of 80% MT. Pt reported no complaints, and stated that the stimulation was tolerable. Upon completion of mapping, pt completed a few treatment intervals for observation of side effects. Pt tolerated tx well. Pt departed from clinic without issue.

## 2021-04-30 ENCOUNTER — Encounter (HOSPITAL_COMMUNITY): Payer: Self-pay | Admitting: Psychiatry

## 2021-04-30 ENCOUNTER — Other Ambulatory Visit (HOSPITAL_COMMUNITY): Payer: Medicare HMO | Attending: Psychiatry

## 2021-04-30 DIAGNOSIS — R69 Illness, unspecified: Secondary | ICD-10-CM | POA: Diagnosis not present

## 2021-04-30 DIAGNOSIS — F332 Major depressive disorder, recurrent severe without psychotic features: Secondary | ICD-10-CM

## 2021-04-30 NOTE — Progress Notes (Signed)
Patient reported to [facility] for Repetitive Transcranial Magnetic Stimulation treatment for severe episode of recurrent major depressive disorder, without psychotic features. Patient presented with appropriate affect, level mood and denied any suicidal or homicidal ideations. Patient denies any other current symptoms and remains optimistic with continued [place] treatment. Patient reported no change in alcohol/substance use, caffeine consumption, sleep pattern or metal implant status since previous tx. Pt watched TV. Power was titrated to 90% for the duration of tx and will remain there until patient gets adjusted. Patient reported no complaints or discomfort. Patient listened to music during tx. Patient departed post-treatment with no concerns or complaints.

## 2021-05-03 ENCOUNTER — Other Ambulatory Visit: Payer: Self-pay | Admitting: Acute Care

## 2021-05-03 ENCOUNTER — Other Ambulatory Visit: Payer: Self-pay

## 2021-05-03 ENCOUNTER — Other Ambulatory Visit (HOSPITAL_BASED_OUTPATIENT_CLINIC_OR_DEPARTMENT_OTHER): Payer: Medicare HMO

## 2021-05-03 DIAGNOSIS — Z87891 Personal history of nicotine dependence: Secondary | ICD-10-CM

## 2021-05-03 DIAGNOSIS — F332 Major depressive disorder, recurrent severe without psychotic features: Secondary | ICD-10-CM | POA: Diagnosis not present

## 2021-05-03 DIAGNOSIS — F17210 Nicotine dependence, cigarettes, uncomplicated: Secondary | ICD-10-CM

## 2021-05-03 DIAGNOSIS — R69 Illness, unspecified: Secondary | ICD-10-CM | POA: Diagnosis not present

## 2021-05-03 NOTE — Progress Notes (Signed)
Patient reported to [facility] for Repetitive Transcranial Magnetic Stimulation treatment for severe episode of recurrent major depressive disorder, without psychotic features. Patient presented with appropriate affect, level mood and denied any suicidal or homicidal ideations. Patient denies any other current symptoms and remains optimistic with continued [place] treatment. Patient reported no change in alcohol/substance use, caffeine consumption, sleep pattern or metal implant status since previous tx. Pt watched TV. Power was titrated to 100% for the duration of tx and will remain there until patient gets adjusted. Patient reported no complaints or discomfort. Patient listened to music during tx. Patient departed post-treatment with no concerns or complaints.

## 2021-05-04 ENCOUNTER — Other Ambulatory Visit (HOSPITAL_BASED_OUTPATIENT_CLINIC_OR_DEPARTMENT_OTHER): Payer: Medicare HMO

## 2021-05-04 DIAGNOSIS — R69 Illness, unspecified: Secondary | ICD-10-CM | POA: Diagnosis not present

## 2021-05-04 DIAGNOSIS — F332 Major depressive disorder, recurrent severe without psychotic features: Secondary | ICD-10-CM

## 2021-05-04 NOTE — Progress Notes (Signed)
Patient reported to [facility] for Repetitive Transcranial Magnetic Stimulation treatment for severe episode of recurrent major depressive disorder, without psychotic features. Patient presented with appropriate affect, level mood and denied any suicidal or homicidal ideations. Patient denies any other current symptoms and remains optimistic with continued [place] treatment. Patient reported no change in alcohol/substance use, caffeine consumption, sleep pattern or metal implant status since previous tx. Pt watched TV. Power was titrated to 110% for the duration of tx and will remain there until patient gets adjusted. Patient reported no complaints or discomfort. Patient listened to music during tx. Patient departed post-treatment with no concerns or complaints.

## 2021-05-05 ENCOUNTER — Other Ambulatory Visit: Payer: Self-pay

## 2021-05-05 ENCOUNTER — Other Ambulatory Visit (HOSPITAL_COMMUNITY): Payer: Medicare HMO | Attending: Psychiatry

## 2021-05-05 DIAGNOSIS — R69 Illness, unspecified: Secondary | ICD-10-CM | POA: Diagnosis not present

## 2021-05-05 DIAGNOSIS — F332 Major depressive disorder, recurrent severe without psychotic features: Secondary | ICD-10-CM

## 2021-05-05 NOTE — Progress Notes (Signed)
Patient reported to [facility] for Repetitive Transcranial Magnetic Stimulation treatment for severe episode of recurrent major depressive disorder, without psychotic features. Patient presented with appropriate affect, level mood and denied any suicidal or homicidal ideations. Patient denies any other current symptoms and remains optimistic with continued [place] treatment. Patient reported no change in alcohol/substance use, caffeine consumption, sleep pattern or metal implant status since previous tx. Pt watched TV. Power was titrated to 120% for the duration of tx and will remain there until patient gets adjusted. Patient reported no complaints or discomfort. Patient listened to music during tx. Patient departed post-treatment with no concerns or complaints.

## 2021-05-06 ENCOUNTER — Other Ambulatory Visit (HOSPITAL_BASED_OUTPATIENT_CLINIC_OR_DEPARTMENT_OTHER): Payer: Medicare HMO

## 2021-05-06 DIAGNOSIS — F332 Major depressive disorder, recurrent severe without psychotic features: Secondary | ICD-10-CM | POA: Diagnosis not present

## 2021-05-06 DIAGNOSIS — R69 Illness, unspecified: Secondary | ICD-10-CM | POA: Diagnosis not present

## 2021-05-06 NOTE — Progress Notes (Signed)
Patient reported to [facility] for Repetitive Transcranial Magnetic Stimulation treatment for severe episode of recurrent major depressive disorder, without psychotic features. Patient presented with appropriate affect, level mood and denied any suicidal or homicidal ideations. Patient denies any other current symptoms and remains optimistic with continued [place] treatment. Patient reported no change in alcohol/substance use, caffeine consumption, sleep pattern or metal implant status since previous tx. Pt watched TV. Power was titrated to 120% for the duration of tx and will remain there until patient gets adjusted. Patient reported no complaints or discomfort. Patient listened to music during tx. Pt states that last night she felt a "phantom tapping sensation" on her head like during tx. Patient departed post-treatment with no concerns or complaints.

## 2021-05-07 ENCOUNTER — Other Ambulatory Visit: Payer: Self-pay

## 2021-05-07 ENCOUNTER — Other Ambulatory Visit (HOSPITAL_BASED_OUTPATIENT_CLINIC_OR_DEPARTMENT_OTHER): Payer: Medicare HMO

## 2021-05-07 DIAGNOSIS — F332 Major depressive disorder, recurrent severe without psychotic features: Secondary | ICD-10-CM

## 2021-05-07 DIAGNOSIS — R69 Illness, unspecified: Secondary | ICD-10-CM | POA: Diagnosis not present

## 2021-05-07 NOTE — Progress Notes (Signed)
Patient reported to [facility] for Repetitive Transcranial Magnetic Stimulation treatment for severe episode of recurrent major depressive disorder, without psychotic features. Patient presented with appropriate affect, level mood and denied any suicidal or homicidal ideations. Patient denies any other current symptoms and remains optimistic with continued [place] treatment. Patient reported no change in alcohol/substance use, caffeine consumption, sleep pattern or metal implant status since previous tx. Pt watched TV. Power was titrated to 120% for the duration of tx and will remain there until patient gets adjusted. Patient reported no complaints or discomfort. Patient departed post-treatment with no concerns or complaints. Patient listened to music during tx. Pt completed the PHQ-9 with a score of 8.

## 2021-05-10 ENCOUNTER — Other Ambulatory Visit (HOSPITAL_BASED_OUTPATIENT_CLINIC_OR_DEPARTMENT_OTHER): Payer: Medicare HMO

## 2021-05-10 ENCOUNTER — Other Ambulatory Visit: Payer: Self-pay

## 2021-05-10 DIAGNOSIS — F332 Major depressive disorder, recurrent severe without psychotic features: Secondary | ICD-10-CM

## 2021-05-10 DIAGNOSIS — R69 Illness, unspecified: Secondary | ICD-10-CM | POA: Diagnosis not present

## 2021-05-10 NOTE — Progress Notes (Signed)
Patient reported to [facility] for Repetitive Transcranial Magnetic Stimulation treatment for severe episode of recurrent major depressive disorder, without psychotic features. Patient presented with appropriate affect, level mood and denied any suicidal or homicidal ideations. Patient denies any other current symptoms and remains optimistic with continued [place] treatment. Patient reported no change in alcohol/substance use, caffeine consumption, sleep pattern or metal implant status since previous tx. Pt watched TV. Power was titrated to 120% for the duration of tx and will remain there until patient gets adjusted. Patient reported no complaints or discomfort. Patient departed post-treatment with no concerns or complaints. Patient listened to music during tx.

## 2021-05-11 ENCOUNTER — Other Ambulatory Visit (HOSPITAL_COMMUNITY): Payer: Medicare HMO

## 2021-05-11 DIAGNOSIS — F332 Major depressive disorder, recurrent severe without psychotic features: Secondary | ICD-10-CM

## 2021-05-11 NOTE — Progress Notes (Signed)
Patient reported to [facility] for Repetitive Transcranial Magnetic Stimulation treatment for severe episode of recurrent major depressive disorder, without psychotic features. Patient presented with appropriate affect, level mood and denied any suicidal or homicidal ideations. Patient denies any other current symptoms and remains optimistic with continued [place] treatment. Patient reported no change in alcohol/substance use, caffeine consumption, sleep pattern or metal implant status since previous tx. Pt watched TV. Power was titrated to 120% for the duration of tx and will remain there until patient gets adjusted. Patient reported no complaints or discomfort. Patient departed post-treatment with no concerns or complaints. Patient listened to music during tx. Pt would like to know if she can do six weeks of treatment instead of nine. Pt was told that she may not get desired outcome from treatment if she does not complete the full 36 sessions. Pt would like [place] coordinator to speak to the psychiatrist about this. Pt states that she doesn't think she can afford to do the full length of treatment and does not want to be in debt.

## 2021-05-12 ENCOUNTER — Other Ambulatory Visit: Payer: Self-pay

## 2021-05-12 ENCOUNTER — Other Ambulatory Visit (HOSPITAL_BASED_OUTPATIENT_CLINIC_OR_DEPARTMENT_OTHER): Payer: Medicare HMO

## 2021-05-12 DIAGNOSIS — F332 Major depressive disorder, recurrent severe without psychotic features: Secondary | ICD-10-CM

## 2021-05-12 DIAGNOSIS — R69 Illness, unspecified: Secondary | ICD-10-CM | POA: Diagnosis not present

## 2021-05-12 NOTE — Progress Notes (Signed)
Patient reported to [facility] for Repetitive Transcranial Magnetic Stimulation treatment for severe episode of recurrent major depressive disorder, without psychotic features. Patient presented with appropriate affect, level mood and denied any suicidal or homicidal ideations. Patient denies any other current symptoms and remains optimistic with continued [place] treatment. Patient reported no change in alcohol/substance use, caffeine consumption, sleep pattern or metal implant status since previous tx. Pt watched TV. Power was titrated to 120% for the duration of tx and will remain there until patient gets adjusted. Patient reported no complaints or discomfort. Patient departed post-treatment with no concerns or complaints. Patient listened to music during tx.

## 2021-05-13 ENCOUNTER — Other Ambulatory Visit (HOSPITAL_BASED_OUTPATIENT_CLINIC_OR_DEPARTMENT_OTHER): Payer: Medicare HMO

## 2021-05-13 DIAGNOSIS — R69 Illness, unspecified: Secondary | ICD-10-CM | POA: Diagnosis not present

## 2021-05-13 DIAGNOSIS — F332 Major depressive disorder, recurrent severe without psychotic features: Secondary | ICD-10-CM

## 2021-05-13 NOTE — Progress Notes (Signed)
Patient reported to [facility] for Repetitive Transcranial Magnetic Stimulation treatment for severe episode of recurrent major depressive disorder, without psychotic features. Patient presented with appropriate affect, level mood and denied any suicidal or homicidal ideations. Patient denies any other current symptoms and remains optimistic with continued [place] treatment. Patient reported no change in alcohol/substance use, caffeine consumption, sleep pattern or metal implant status since previous tx. Pt watched TV. Power was titrated to 120% for the duration of tx and will remain there until patient gets adjusted. Patient reported no complaints or discomfort. Patient departed post-treatment with no concerns or complaints. Patient listened to music during tx.

## 2021-05-14 ENCOUNTER — Other Ambulatory Visit (HOSPITAL_BASED_OUTPATIENT_CLINIC_OR_DEPARTMENT_OTHER): Payer: Medicare HMO

## 2021-05-14 ENCOUNTER — Other Ambulatory Visit: Payer: Self-pay

## 2021-05-14 DIAGNOSIS — F332 Major depressive disorder, recurrent severe without psychotic features: Secondary | ICD-10-CM

## 2021-05-14 DIAGNOSIS — R69 Illness, unspecified: Secondary | ICD-10-CM | POA: Diagnosis not present

## 2021-05-14 NOTE — Progress Notes (Signed)
Patient reported to [facility] for Repetitive Transcranial Magnetic Stimulation treatment for severe episode of recurrent major depressive disorder, without psychotic features. Patient presented with appropriate affect, level mood and denied any suicidal or homicidal ideations. Patient denies any other current symptoms and remains optimistic with continued [place] treatment. Patient reported no change in alcohol/substance use, caffeine consumption, sleep pattern or metal implant status since previous tx. Pt watched TV. Power was titrated to 120% for the duration of tx and will remain there until patient gets adjusted. Patient reported no complaints or discomfort. Patient departed post-treatment with no concerns or complaints. Patient listened to music during tx. Pt completed the PHQ-9 with a score of 8.

## 2021-05-17 ENCOUNTER — Other Ambulatory Visit (HOSPITAL_BASED_OUTPATIENT_CLINIC_OR_DEPARTMENT_OTHER): Payer: Medicare HMO

## 2021-05-17 ENCOUNTER — Other Ambulatory Visit: Payer: Self-pay

## 2021-05-17 DIAGNOSIS — R69 Illness, unspecified: Secondary | ICD-10-CM | POA: Diagnosis not present

## 2021-05-17 DIAGNOSIS — F332 Major depressive disorder, recurrent severe without psychotic features: Secondary | ICD-10-CM

## 2021-05-17 NOTE — Progress Notes (Signed)
Patient reported to [facility] for Repetitive Transcranial Magnetic Stimulation treatment for severe episode of recurrent major depressive disorder, without psychotic features. Patient presented with appropriate affect, level mood and denied any suicidal or homicidal ideations. Patient denies any other current symptoms and remains optimistic with continued [place] treatment. Patient reported no change in alcohol/substance use, caffeine consumption, sleep pattern or metal implant status since previous tx. Pt watched TV. Power was titrated to 120% for the duration of tx and will remain there until patient gets adjusted. Patient reported no complaints or discomfort. Patient departed post-treatment with no concerns or complaints. Patient listened to music during tx.

## 2021-05-18 ENCOUNTER — Ambulatory Visit (INDEPENDENT_AMBULATORY_CARE_PROVIDER_SITE_OTHER): Payer: Medicare HMO | Admitting: Adult Health

## 2021-05-18 ENCOUNTER — Encounter: Payer: Self-pay | Admitting: Adult Health

## 2021-05-18 ENCOUNTER — Other Ambulatory Visit (HOSPITAL_BASED_OUTPATIENT_CLINIC_OR_DEPARTMENT_OTHER): Payer: Medicare HMO

## 2021-05-18 DIAGNOSIS — F411 Generalized anxiety disorder: Secondary | ICD-10-CM

## 2021-05-18 DIAGNOSIS — F331 Major depressive disorder, recurrent, moderate: Secondary | ICD-10-CM | POA: Diagnosis not present

## 2021-05-18 DIAGNOSIS — F5105 Insomnia due to other mental disorder: Secondary | ICD-10-CM | POA: Diagnosis not present

## 2021-05-18 DIAGNOSIS — F332 Major depressive disorder, recurrent severe without psychotic features: Secondary | ICD-10-CM

## 2021-05-18 DIAGNOSIS — R69 Illness, unspecified: Secondary | ICD-10-CM | POA: Diagnosis not present

## 2021-05-18 DIAGNOSIS — F99 Mental disorder, not otherwise specified: Secondary | ICD-10-CM | POA: Diagnosis not present

## 2021-05-18 MED ORDER — VENLAFAXINE HCL ER 150 MG PO CP24: 150.0000 mg | ORAL_CAPSULE | Freq: Every day | ORAL | 3 refills | Status: DC

## 2021-05-18 MED ORDER — TRAZODONE HCL 100 MG PO TABS: 250.0000 mg | ORAL_TABLET | Freq: Every day | ORAL | 3 refills | Status: DC

## 2021-05-18 MED ORDER — GABAPENTIN 600 MG PO TABS: 600.0000 mg | ORAL_TABLET | Freq: Every day | ORAL | 3 refills | Status: DC

## 2021-05-18 MED ORDER — VENLAFAXINE HCL ER 37.5 MG PO CP24: 37.5000 mg | ORAL_CAPSULE | Freq: Every day | ORAL | 3 refills | Status: DC

## 2021-05-18 MED ORDER — BUSPIRONE HCL 10 MG PO TABS: ORAL_TABLET | ORAL | 3 refills | Status: DC

## 2021-05-18 MED ORDER — GABAPENTIN 400 MG PO CAPS: 400.0000 mg | ORAL_CAPSULE | Freq: Two times a day (BID) | ORAL | 3 refills | Status: DC

## 2021-05-18 MED ORDER — RISPERIDONE 1 MG PO TABS: ORAL_TABLET | ORAL | 5 refills | Status: DC

## 2021-05-18 MED ORDER — PROPRANOLOL HCL 10 MG PO TABS: 10.0000 mg | ORAL_TABLET | Freq: Three times a day (TID) | ORAL | 3 refills | Status: DC

## 2021-05-18 NOTE — Progress Notes (Signed)
Patient reported to [facility] for Repetitive Transcranial Magnetic Stimulation treatment for severe episode of recurrent major depressive disorder, without psychotic features. Patient presented with appropriate affect, level mood and denied any suicidal or homicidal ideations. Patient denies any other current symptoms and remains optimistic with continued [place] treatment. Patient reported no change in alcohol/substance use, caffeine consumption, sleep pattern or metal implant status since previous tx. Pt watched TV. Power was titrated to 120% for the duration of tx and will remain there until patient gets adjusted. Patient reported no complaints or discomfort. Patient departed post-treatment with no concerns or complaints. Patient listened to music during tx.

## 2021-05-18 NOTE — Progress Notes (Signed)
Crystal Duarte [id number] 12/17/1960 61 y.o.  Subjective:   Patient ID:  Crystal Duarte is a 61 y.o. (DOB 12/13/1960) female.  Chief Complaint: No chief complaint on file.   HPI Crystal Duarte presents to the office today for follow-up of MDD, GAD, and insomnia.  Referred by daughter.  Started [facility] 2 weeks ago and feels like it is helping. Stating "I feel a little better". Describes mood today as "ok". Pleasant. Tearful at times. Mood symptoms - reports depression, anxiety, and irritability. Stating "I'm not feeling as bad as I was". Has been able to let things go - able to rationalize things more. Still having some "paranoia" around neighbor. Feels like the addition of Risperdal and restarting TMS has been helpful. Working with a therapist - not recently - doesn't have the co-pay she needs. Concerned about finances - trying to minimize everything. Lives alone with dog. Varying interest and motivation. Taking medications as prescribed. Energy levels lower. Active, does not have a regular exercise routine.  Enjoys some usual interests and activities. Spending time with family. Daughter - Son and 7 grandchildren. Appetite adequate. Weight stable 185 pounds. Sleeps well most nights. Averages 8 hours of solid sleep with Risperdal. Focus and concentration stable. Completing tasks. Managing aspects of household. Receives SSI disability (2016). Denies SI or HI.  Denies AH or VH.   Reporting a long battle with mental illness to include multiple hospitalizations, multiple medication trials, ECT, TMS, and CBT.   AIMS    Flowsheet Row Admission (Discharged) from 11/16/2017 in [place] Admission (Discharged) from 07/18/2017 in [place] [facility]  AIMS Total Score 0 0      AUDIT    Flowsheet Row Admission (Discharged) from 11/16/2017 in [place] Admission (Discharged) from 07/18/2017 in [place] [facility]  Alcohol Use Disorder Identification Test Final Score (AUDIT) 0 0      ECT-MADRS    Flowsheet Row Admission (Discharged) from 11/16/2017 in [place] Total Score 34      GAD-7    [place] Office Visit from 01/21/2019 in [place] Visit from 01/09/2018 in [place] Visit from 07/28/2016 in [place]  Total GAD-7 Score 6 6 18       Mini-Mental    Flowsheet Row Admission (Discharged) from 11/16/2017 in [place]  Total Score (max 30 points ) 30      PHQ2-9    Flowsheet Row Clinical Support from 05/14/2021 in [place] from 05/07/2021 in [place] Visit from 04/22/2021 in [facility] Office Visit from 02/10/2020 in [place] [facility] Visit from 01/21/2019 in [place]  PHQ-2 Total Score 2 2 6 4 1   PHQ-9 Total Score 8 8 24 8 10       [place] Office Visit from 04/22/2021 in [facility] Admission (Discharged) from 11/16/2017 in [place] Admission (Discharged) from 11/03/2017 in [place] [facility]  C-SSRS RISK CATEGORY Error: Q3, 4, or 5 should not be populated when Q2 is No High Risk High Risk        Review of Systems:  Review of Systems  Musculoskeletal:  Negative for gait problem.  Neurological:  Negative for tremors.  Psychiatric/Behavioral:  Please refer to HPI   Medications: I have reviewed the patient's current medications.  Current Outpatient Medications  Medication Sig Dispense Refill   albuterol (VENTOLIN HFA) 108 (90 Base) MCG/ACT inhaler Inhale into the lungs as needed.     ANORO ELLIPTA 62.5-25 MCG/ACT AEPB INHALE  ONE DOSE BY MOUTH DAILY 60 each 0   busPIRone (BUSPAR) 10 MG tablet Take two tablets three times daily for anxiety. 540 tablet 3   FLUBLOK QUADRIVALENT 0.5 ML injection      gabapentin (NEURONTIN) 400 MG capsule Take 1 capsule (400 mg total) by mouth 2 (two) times daily. 180 capsule 3   gabapentin (NEURONTIN) 600 MG tablet Take 1 tablet (600 mg total) by mouth daily. 90 tablet 3   ipratropium-albuterol (DUONEB) 0.5-2.5 (3) MG/3ML SOLN Take 3 mLs by nebulization every 6 (six) hours as needed. 360 mL 12   montelukast (SINGULAIR) 10 MG tablet Take 1 tablet (10 mg total) by mouth at bedtime. 30 tablet 11   Multiple Vitamin (MULTI-VITAMINS) TABS Take 1 tablet by mouth daily.      omeprazole (PRILOSEC) 20 MG capsule Take 1 capsule (20 mg total) by mouth daily. 90 capsule 3   propranolol (INDERAL) 10 MG tablet Take 1 tablet (10 mg total) by mouth 3 (three) times daily. 90 tablet 3   risperiDONE (RISPERDAL) 1 MG tablet Take one and 1/2 tablets at bedtime for one week, then increase to two tablets at bedtime. 60 tablet 5   rosuvastatin (CRESTOR) 20 MG tablet Take 1 tablet (20 mg total) by mouth daily. 90 tablet 3   traZODone (DESYREL) 100 MG tablet Take 2.5 tablets (250 mg total) by mouth at bedtime. 225 tablet 3   venlafaxine XR (EFFEXOR-XR) 150 MG 24 hr capsule Take 1 capsule (150 mg total) by mouth daily with breakfast. 90 capsule 3   venlafaxine XR (EFFEXOR-XR) 37.5 MG 24 hr capsule Take 1 capsule (37.5 mg total) by mouth daily. 90 capsule 3   No current facility-administered medications for this visit.    Medication Side Effects: None  Allergies:  Allergies  Allergen Reactions   Other Nausea And Vomiting    novacaine    Procaine Nausea And Vomiting   Latuda [Lurasidone Hcl] Anxiety   Sulfa Antibiotics Rash    Only in sunlight     Past Medical History:  Diagnosis Date   Coccidioidomycosis, pulmonary (HCC)    Depression    Hx of adenomatous polyp of colon 12/04/2014   Major depressive  disorder    since 14, electroconvulsive therapy, and transcranial magnetic stimulation recently for 6 weeks every day    Osteopenia 01/2019   T score -2.1 FRAX 9.6% / 2.1% stable from prior DEXA   Panic attack    PONV (postoperative nausea and vomiting)    VAIN I (vaginal intraepithelial neoplasia grade I) 2015   Positive HPV negative subtype 16, 18/45    Past Medical History, Surgical history, Social history, and Family history were reviewed and updated as appropriate.   Please see review of systems for further details on the patient's review from today.   Objective:   Physical Exam:  There were no vitals taken for this visit.  Physical Exam Constitutional:      General: She is not in acute distress. Musculoskeletal:        General: No deformity.  Neurological:     Mental Status: She is alert and oriented to person, place, and time.     Coordination: Coordination normal.  Psychiatric:  Attention and Perception: Attention and perception normal. She does not perceive auditory or visual hallucinations.        Mood and Affect: Mood normal. Mood is not anxious or depressed. Affect is not labile, blunt, angry or inappropriate.        Speech: Speech normal.        Behavior: Behavior normal.        Thought Content: Thought content normal. Thought content is not paranoid or delusional. Thought content does not include homicidal or suicidal ideation. Thought content does not include homicidal or suicidal plan.        Cognition and Memory: Cognition and memory normal.        Judgment: Judgment normal.     Comments: Insight intact    Lab Review:     Component Value Date/Time   NA 147 (H) 02/12/2021 1409   NA 136 05/14/2013 1829   K 5.0 02/12/2021 1409   K 3.8 05/14/2013 1829   CL 108 (H) 02/12/2021 1409   CL 98 05/14/2013 1829   CO2 25 02/12/2021 1409   CO2 35 (H) 05/14/2013 1829   GLUCOSE 106 (H) 02/12/2021 1409   GLUCOSE 122 (H) 10/08/2020 1204   GLUCOSE 104 (H)  05/14/2013 1829   BUN 10 02/12/2021 1409   BUN 7 05/14/2013 1829   CREATININE 0.87 02/12/2021 1409   CREATININE 0.83 01/27/2020 0940   CALCIUM 9.3 02/12/2021 1409   CALCIUM 9.1 05/14/2013 1829   PROT 5.9 (L) 02/12/2021 1409   PROT 7.2 05/14/2013 1829   ALBUMIN 4.2 02/12/2021 1409   ALBUMIN 3.5 05/14/2013 1829   AST 16 02/12/2021 1409   AST 16 05/14/2013 1829   ALT 19 02/12/2021 1409   ALT 36 05/14/2013 1829   ALKPHOS 92 02/12/2021 1409   ALKPHOS 83 05/14/2013 1829   BILITOT 0.3 02/12/2021 1409   BILITOT 0.5 05/14/2013 1829   GFRNONAA 86 03/02/2020 1553   GFRNONAA >60 05/14/2013 1829   GFRAA 99 03/02/2020 1553   GFRAA >60 05/14/2013 1829       Component Value Date/Time   WBC 11.8 (H) 08/03/2020 1213   RBC 4.58 08/03/2020 1213   HGB 14.6 08/03/2020 1213   HGB 13.4 05/14/2013 1829   HCT 43.2 08/03/2020 1213   HCT 40.4 05/14/2013 1829   PLT 244.0 08/03/2020 1213   PLT 267 05/14/2013 1829   MCV 94.4 08/03/2020 1213   MCV 95 05/14/2013 1829   MCH 31.6 01/27/2020 0940   MCHC 33.7 08/03/2020 1213   RDW 13.9 08/03/2020 1213   RDW 13.3 05/14/2013 1829   LYMPHSABS 3.4 08/03/2020 1213   LYMPHSABS 3.6 05/14/2013 1829   MONOABS 0.6 08/03/2020 1213   MONOABS 0.6 05/14/2013 1829   EOSABS 0.3 08/03/2020 1213   EOSABS 0.2 05/14/2013 1829   BASOSABS 0.1 08/03/2020 1213   BASOSABS 0.1 05/14/2013 1829    No results found for: POCLITH, LITHIUM   No results found for: PHENYTOIN, PHENOBARB, VALPROATE, CBMZ   .res Assessment: Plan:    Plan:  Trazadone 250mg  at bedtime Buspar 10mg  - 20mg  TID Propranolol 10mg  TID Effexor XR 150mg  daily Effexor XR 37.5 daily Gabapentin 400mg  twice daily and 600mg  at bedtime Risperdal 1mg  at bedtime - depression, anxiety, paranoia.  TMS treatment at [facility]  Discussed potential metabolic side effects associated with atypical antipsychotics, as well as potential risk for movement side effects. Advised pt to contact office if movement side  effects occur.    RTC 4 weeks   Time spent  with patient was 60 minutes. Greater than 50% of face to face time with patient was spent on counseling and coordination of care.   Diagnoses and all orders for this visit:  Major depressive disorder, recurrent episode, moderate (HCC) -     risperiDONE (RISPERDAL) 1 MG tablet; Take one and 1/2 tablets at bedtime for one week, then increase to two tablets at bedtime. -     busPIRone (BUSPAR) 10 MG tablet; Take two tablets three times daily for anxiety. -     venlafaxine XR (EFFEXOR-XR) 37.5 MG 24 hr capsule; Take 1 capsule (37.5 mg total) by mouth daily. -     venlafaxine XR (EFFEXOR-XR) 150 MG 24 hr capsule; Take 1 capsule (150 mg total) by mouth daily with breakfast. -     propranolol (INDERAL) 10 MG tablet; Take 1 tablet (10 mg total) by mouth 3 (three) times daily. -     traZODone (DESYREL) 100 MG tablet; Take 2.5 tablets (250 mg total) by mouth at bedtime. -     gabapentin (NEURONTIN) 400 MG capsule; Take 1 capsule (400 mg total) by mouth 2 (two) times daily. -     gabapentin (NEURONTIN) 600 MG tablet; Take 1 tablet (600 mg total) by mouth daily.  Generalized anxiety disorder  Insomnia due to other mental disorder     Please see After Visit Summary for patient specific instructions.  Future Appointments  Date Time Provider [place]  05/18/2021  2:00 PM Lita Mains, [place] [facility] None  05/19/2021 12:00 PM BH-TMS TECH [facility] None  05/20/2021  1:00 PM BH-TMS TECH BH-T[facility] None  05/21/2021  1:00 PM BH-TMS TECH BH[facility] None    No orders of the defined types were placed in this encounter.   -------------------------------

## 2021-05-19 ENCOUNTER — Ambulatory Visit: Payer: Medicare HMO | Admitting: Internal Medicine

## 2021-05-19 ENCOUNTER — Other Ambulatory Visit (HOSPITAL_BASED_OUTPATIENT_CLINIC_OR_DEPARTMENT_OTHER): Payer: Medicare HMO

## 2021-05-19 ENCOUNTER — Other Ambulatory Visit: Payer: Self-pay

## 2021-05-19 DIAGNOSIS — F332 Major depressive disorder, recurrent severe without psychotic features: Secondary | ICD-10-CM

## 2021-05-19 DIAGNOSIS — R69 Illness, unspecified: Secondary | ICD-10-CM | POA: Diagnosis not present

## 2021-05-19 NOTE — Progress Notes (Signed)
Patient reported to [facility] for Repetitive Transcranial Magnetic Stimulation treatment for severe episode of recurrent major depressive disorder, without psychotic features. Patient presented with appropriate affect, level mood and denied any suicidal or homicidal ideations. Patient denies any other current symptoms and remains optimistic with continued [place] treatment. Patient reported no change in alcohol/substance use, caffeine consumption, sleep pattern or metal implant status since previous tx. Pt watched TV. Power was titrated to 120% for the duration of tx and will remain there until patient gets adjusted. Patient reported no complaints or discomfort. Patient departed post-treatment with no concerns or complaints. Patient listened to music during tx.

## 2021-05-20 ENCOUNTER — Other Ambulatory Visit (HOSPITAL_BASED_OUTPATIENT_CLINIC_OR_DEPARTMENT_OTHER): Payer: Medicare HMO

## 2021-05-20 DIAGNOSIS — F332 Major depressive disorder, recurrent severe without psychotic features: Secondary | ICD-10-CM | POA: Diagnosis not present

## 2021-05-20 DIAGNOSIS — R69 Illness, unspecified: Secondary | ICD-10-CM | POA: Diagnosis not present

## 2021-05-20 NOTE — Progress Notes (Signed)
Patient reported to [facility] for Repetitive Transcranial Magnetic Stimulation treatment for severe episode of recurrent major depressive disorder, without psychotic features. Patient presented with appropriate affect, level mood and denied any suicidal or homicidal ideations. Patient denies any other current symptoms and remains optimistic with continued [place] treatment. Patient reported no change in alcohol/substance use, caffeine consumption, sleep pattern or metal implant status since previous tx. Pt watched TV. Power was titrated to 120% for the duration of tx and will remain there until patient gets adjusted. Patient reported no complaints or discomfort. Patient departed post-treatment with no concerns or complaints. Patient listened to music during tx.

## 2021-05-21 ENCOUNTER — Other Ambulatory Visit: Payer: Self-pay

## 2021-05-21 ENCOUNTER — Other Ambulatory Visit (HOSPITAL_BASED_OUTPATIENT_CLINIC_OR_DEPARTMENT_OTHER): Payer: Medicare HMO

## 2021-05-21 DIAGNOSIS — R69 Illness, unspecified: Secondary | ICD-10-CM | POA: Diagnosis not present

## 2021-05-21 DIAGNOSIS — F332 Major depressive disorder, recurrent severe without psychotic features: Secondary | ICD-10-CM | POA: Diagnosis not present

## 2021-05-21 NOTE — Progress Notes (Signed)
Patient reported to [facility] for Repetitive Transcranial Magnetic Stimulation treatment for severe episode of recurrent major depressive disorder, without psychotic features. Patient presented with appropriate affect, level mood and denied any suicidal or homicidal ideations. Patient denies any other current symptoms and remains optimistic with continued [place] treatment. Patient reported no change in alcohol/substance use, caffeine consumption, sleep pattern or metal implant status since previous tx. Pt watched TV. Power was titrated to 120% for the duration of tx and will remain there until patient gets adjusted. Patient reported no complaints or discomfort. Patient departed post-treatment with no concerns or complaints. Patient listened to music during tx. Pt completed the PHQ-9 with a score of 7.

## 2021-05-24 ENCOUNTER — Telehealth (HOSPITAL_COMMUNITY): Payer: Self-pay

## 2021-05-24 ENCOUNTER — Other Ambulatory Visit: Payer: Self-pay

## 2021-05-24 ENCOUNTER — Other Ambulatory Visit (HOSPITAL_BASED_OUTPATIENT_CLINIC_OR_DEPARTMENT_OTHER): Payer: Medicare HMO

## 2021-05-24 DIAGNOSIS — F332 Major depressive disorder, recurrent severe without psychotic features: Secondary | ICD-10-CM

## 2021-05-24 DIAGNOSIS — R69 Illness, unspecified: Secondary | ICD-10-CM | POA: Diagnosis not present

## 2021-05-24 NOTE — Progress Notes (Signed)
Patient reported to [facility] for Repetitive Transcranial Magnetic Stimulation treatment for severe episode of recurrent major depressive disorder, without psychotic features. Patient presented with appropriate affect, level mood and denied any suicidal or homicidal ideations. Patient denies any other current symptoms and remains optimistic with continued [place] treatment. Patient reported no change in alcohol/substance use, caffeine consumption, sleep pattern or metal implant status since previous tx. Pt watched TV. Power was titrated to 120% for the duration of tx and will remain there until patient gets adjusted. Patient reported no complaints or discomfort. Patient departed post-treatment with no concerns or complaints. Patient listened to music during tx.

## 2021-05-24 NOTE — Telephone Encounter (Signed)
Pt stated that she is extremely sick right now and she would like to be called in 2 weeks when she is able to get her schedule together for scheduling with pulmonary rehab.

## 2021-05-24 NOTE — Telephone Encounter (Signed)
Pt insurance is active and benefits verified through [facility] Co-pay 0, DED 0/0 met, out of pocket $4,500/$400 met, co-insurance 20%. no pre-authorization required. Passport, 05/24/2021_0 Terrence Dupont, REF# [id number]   Will contact patient to see if she is interested in the Cardiac Rehab Program. If interested, patient will need to complete follow up appt. Once completed, patient will be contacted for scheduling upon review by the RN Navigator.

## 2021-05-25 ENCOUNTER — Other Ambulatory Visit (HOSPITAL_BASED_OUTPATIENT_CLINIC_OR_DEPARTMENT_OTHER): Payer: Medicare HMO

## 2021-05-25 DIAGNOSIS — F332 Major depressive disorder, recurrent severe without psychotic features: Secondary | ICD-10-CM | POA: Diagnosis not present

## 2021-05-25 DIAGNOSIS — R69 Illness, unspecified: Secondary | ICD-10-CM | POA: Diagnosis not present

## 2021-05-25 NOTE — Progress Notes (Signed)
Patient reported to [facility] for Repetitive Transcranial Magnetic Stimulation treatment for severe episode of recurrent major depressive disorder, without psychotic features. Patient presented with appropriate affect, level mood and denied any suicidal or homicidal ideations. Patient denies any other current symptoms and remains optimistic with continued [place] treatment. Patient reported no change in alcohol/substance use, caffeine consumption, sleep pattern or metal implant status since previous tx. Pt watched TV. Power was titrated to 120% for the duration of tx and will remain there until patient gets adjusted. Patient reported no complaints or discomfort. Patient departed post-treatment with no concerns or complaints. Patient listened to music during tx.

## 2021-05-26 ENCOUNTER — Encounter (HOSPITAL_COMMUNITY): Payer: Medicare HMO

## 2021-05-27 ENCOUNTER — Other Ambulatory Visit: Payer: Self-pay

## 2021-05-27 ENCOUNTER — Other Ambulatory Visit (HOSPITAL_BASED_OUTPATIENT_CLINIC_OR_DEPARTMENT_OTHER): Payer: Medicare HMO

## 2021-05-27 DIAGNOSIS — F332 Major depressive disorder, recurrent severe without psychotic features: Secondary | ICD-10-CM

## 2021-05-27 DIAGNOSIS — R69 Illness, unspecified: Secondary | ICD-10-CM | POA: Diagnosis not present

## 2021-05-27 NOTE — Progress Notes (Signed)
Patient reported to [facility] for Repetitive Transcranial Magnetic Stimulation treatment for severe episode of recurrent major depressive disorder, without psychotic features. Patient presented with appropriate affect, level mood and denied any suicidal or homicidal ideations. Patient denies any other current symptoms and remains optimistic with continued [place] treatment. Patient reported no change in alcohol/substance use, caffeine consumption, sleep pattern or metal implant status since previous tx. Pt watched TV. Power was titrated to 120% for the duration of tx and will remain there until patient gets adjusted. Patient reported no complaints or discomfort. Patient departed post-treatment with no concerns or complaints. Patient listened to music during tx.

## 2021-05-31 ENCOUNTER — Encounter (HOSPITAL_COMMUNITY): Payer: Medicare HMO

## 2021-06-01 ENCOUNTER — Encounter (HOSPITAL_COMMUNITY): Payer: Medicare HMO

## 2021-06-02 ENCOUNTER — Other Ambulatory Visit: Payer: Self-pay

## 2021-06-02 ENCOUNTER — Other Ambulatory Visit (HOSPITAL_COMMUNITY): Payer: Medicare HMO | Attending: Psychiatry

## 2021-06-02 DIAGNOSIS — F332 Major depressive disorder, recurrent severe without psychotic features: Secondary | ICD-10-CM | POA: Diagnosis not present

## 2021-06-02 DIAGNOSIS — R69 Illness, unspecified: Secondary | ICD-10-CM | POA: Diagnosis not present

## 2021-06-02 NOTE — Progress Notes (Signed)
Patient reported to [facility] for Repetitive Transcranial Magnetic Stimulation treatment for severe episode of recurrent major depressive disorder, without psychotic features. Patient presented with appropriate affect, level mood and denied any suicidal or homicidal ideations. Patient denies any other current symptoms and remains optimistic with continued [place] treatment. Patient reported no change in alcohol/substance use, caffeine consumption, sleep pattern or metal implant status since previous tx. Pt watched TV. Power was titrated to 120% for the duration of tx and will remain there until patient gets adjusted. Patient reported no complaints or discomfort. Patient departed post-treatment with no concerns or complaints. Patient listened to music during tx. ?

## 2021-06-03 ENCOUNTER — Encounter (HOSPITAL_COMMUNITY): Payer: Medicare HMO

## 2021-06-04 ENCOUNTER — Other Ambulatory Visit (HOSPITAL_BASED_OUTPATIENT_CLINIC_OR_DEPARTMENT_OTHER): Payer: Medicare HMO

## 2021-06-04 ENCOUNTER — Other Ambulatory Visit: Payer: Self-pay

## 2021-06-04 DIAGNOSIS — F332 Major depressive disorder, recurrent severe without psychotic features: Secondary | ICD-10-CM

## 2021-06-04 DIAGNOSIS — R69 Illness, unspecified: Secondary | ICD-10-CM | POA: Diagnosis not present

## 2021-06-04 NOTE — Progress Notes (Signed)
Patient reported to [facility] for Repetitive Transcranial Magnetic Stimulation treatment for severe episode of recurrent major depressive disorder, without psychotic features. Patient presented with appropriate affect, level mood and denied any suicidal or homicidal ideations. Patient denies any other current symptoms and remains optimistic with continued [place] treatment. Patient reported no change in alcohol/substance use, caffeine consumption, sleep pattern or metal implant status since previous tx. Pt watched TV. Power was titrated to 120% for the duration of tx and will remain there until patient gets adjusted. Patient reported no complaints or discomfort. Patient departed post-treatment with no concerns or complaints.  Pt states that today will be her last day of [place]. Pt will be starting a new job next week and will no longer be able to come in for sessions. This is pt's 22 tx. [facility] Coordinator explained to pt that since she is not completed the full 36 sessions she may not gain the full benefits of tx. Pt has been informed that she may contact this writer if she wishes to come in for future maintenance [place]. Pt completed the PHQ-9 with a score of 0. ?

## 2021-06-11 ENCOUNTER — Encounter (HOSPITAL_COMMUNITY): Payer: Self-pay

## 2021-06-24 ENCOUNTER — Telehealth: Payer: Self-pay | Admitting: Internal Medicine

## 2021-06-24 NOTE — Telephone Encounter (Signed)
Sent Lexiscan instructions through my chart per pt request.   ?

## 2021-06-24 NOTE — Telephone Encounter (Signed)
Patient called in rescheduling her [place] Dr. Gasper Sells ordered back in December that she missed due to being sick. She is requesting she be sent the instructions for it again through mychart. Please advise.  ?

## 2021-07-05 ENCOUNTER — Telehealth (HOSPITAL_COMMUNITY): Payer: Self-pay

## 2021-07-05 NOTE — Telephone Encounter (Signed)
Detailed instructions left on the patient's answering machine. Asked to call back with any questions. S.Elize Pinon EMTP 

## 2021-07-08 ENCOUNTER — Ambulatory Visit (HOSPITAL_COMMUNITY): Payer: Medicare HMO | Attending: Cardiovascular Disease

## 2021-07-08 DIAGNOSIS — R0609 Other forms of dyspnea: Secondary | ICD-10-CM | POA: Diagnosis not present

## 2021-07-08 DIAGNOSIS — F17200 Nicotine dependence, unspecified, uncomplicated: Secondary | ICD-10-CM | POA: Diagnosis not present

## 2021-07-08 DIAGNOSIS — R69 Illness, unspecified: Secondary | ICD-10-CM | POA: Diagnosis not present

## 2021-07-08 DIAGNOSIS — J449 Chronic obstructive pulmonary disease, unspecified: Secondary | ICD-10-CM | POA: Insufficient documentation

## 2021-07-08 DIAGNOSIS — Z8249 Family history of ischemic heart disease and other diseases of the circulatory system: Secondary | ICD-10-CM | POA: Diagnosis not present

## 2021-07-08 DIAGNOSIS — R002 Palpitations: Secondary | ICD-10-CM | POA: Diagnosis not present

## 2021-07-08 LAB — MYOCARDIAL PERFUSION IMAGING
LV dias vol: 50 mL (ref 46–106)
LV sys vol: 12 mL
Nuc Stress EF: 77 %
Peak HR: 101 {beats}/min
Rest HR: 77 {beats}/min
Rest Nuclear Isotope Dose: 10.8 mCi
SDS: 2
SRS: 0
SSS: 2
ST Depression (mm): 0 mm
Stress Nuclear Isotope Dose: 32.1 mCi
TID: 1.18

## 2021-07-08 MED ORDER — TECHNETIUM TC 99M TETROFOSMIN IV KIT
10.8000 | PACK | Freq: Once | INTRAVENOUS | Status: AC | PRN
  Administered 2021-07-08: 10.8 via INTRAVENOUS
  Filled 2021-07-08: qty 11

## 2021-07-08 MED ORDER — REGADENOSON 0.4 MG/5ML IV SOLN
0.4000 mg | Freq: Once | INTRAVENOUS | Status: AC
  Administered 2021-07-08: 0.4 mg via INTRAVENOUS

## 2021-07-08 MED ORDER — TECHNETIUM TC 99M TETROFOSMIN IV KIT
32.1000 | PACK | Freq: Once | INTRAVENOUS | Status: AC | PRN
  Administered 2021-07-08: 32.1 via INTRAVENOUS
  Filled 2021-07-08: qty 33

## 2021-07-15 ENCOUNTER — Other Ambulatory Visit: Payer: Self-pay | Admitting: Adult Health

## 2021-07-15 DIAGNOSIS — F331 Major depressive disorder, recurrent, moderate: Secondary | ICD-10-CM

## 2021-07-15 NOTE — Telephone Encounter (Signed)
Please call to schedule appt.

## 2021-07-16 NOTE — Telephone Encounter (Signed)
Message left for patient to schedule an appt.  ?

## 2021-09-16 ENCOUNTER — Ambulatory Visit (INDEPENDENT_AMBULATORY_CARE_PROVIDER_SITE_OTHER): Payer: Medicare HMO | Admitting: Adult Health

## 2021-09-16 ENCOUNTER — Encounter: Payer: Self-pay | Admitting: Adult Health

## 2021-09-16 DIAGNOSIS — R69 Illness, unspecified: Secondary | ICD-10-CM | POA: Diagnosis not present

## 2021-09-16 DIAGNOSIS — F411 Generalized anxiety disorder: Secondary | ICD-10-CM

## 2021-09-16 DIAGNOSIS — F5105 Insomnia due to other mental disorder: Secondary | ICD-10-CM

## 2021-09-16 DIAGNOSIS — F332 Major depressive disorder, recurrent severe without psychotic features: Secondary | ICD-10-CM | POA: Diagnosis not present

## 2021-09-16 DIAGNOSIS — F22 Delusional disorders: Secondary | ICD-10-CM | POA: Diagnosis not present

## 2021-09-16 DIAGNOSIS — F99 Mental disorder, not otherwise specified: Secondary | ICD-10-CM

## 2021-09-16 DIAGNOSIS — F333 Major depressive disorder, recurrent, severe with psychotic symptoms: Secondary | ICD-10-CM | POA: Diagnosis not present

## 2021-09-16 MED ORDER — CARIPRAZINE HCL 1.5 MG PO CAPS: 1.5000 mg | ORAL_CAPSULE | Freq: Every day | ORAL | 2 refills | Status: DC

## 2021-09-16 NOTE — Progress Notes (Signed)
Crystal Duarte [id number] Sep 02, 1960 61 y.o.  Subjective:   Patient ID:  Crystal Duarte is a 61 y.o. (DOB 17-Feb-1961) female.  Chief Complaint: No chief complaint on file.   HPI [name] presents to the office today for follow-up of MDD, GAD, and insomnia.  Describes mood today as "not good". Pleasant. Flat. Tearful at times. Mood symptoms - reports depression, anxiety, and irritability. Stating "I'm not feeling to good". Has tapered off of Risperdal - felt too fatigued with it. Some concerns regarding neighbor above her. Feels like she is watching her and listening to her conversations. Reports she stomps and walks back and forth. Stating "she can't stand it when I play music" - "stomping on the floor". Feels like she consumes a lot of her head space. Having to physically and mentally prepare herself to leave the house - feels like people are looking at her. Doesn't want to move because she likes her surroundings and can't afford anything else. Working with a Transport planner. Concerned about finances - trying to minimize everything. Varying interest and motivation. Taking medications as prescribed. Energy levels lower. Active, does not have a regular exercise routine.  Enjoys some usual interests and activities. Single. Lives alone. Daughter - Son and 7 grandchildren. Spending time with family. Mother - 52 local - sister helping with care. Appetite adequate. Weight stable 185 pounds. Sleeps well most nights. Averages 6 to 7 hours of broken sleep. Focus and concentration stable. Completing tasks. Managing aspects of household. Receives SSI disability (2016).  Works part time at a senior living facility.  Denies SI or HI.  Denies AH or VH. Denies self harm. Denies substance use - using CBD.   Reporting a long battle with mental illness to include multiple hospitalizations, multiple medication trials, ECT, TMS, and CBT.  [place] Office Visit from 01/21/2019 in  [place] Office Visit from 01/09/2018 in [place] Visit from 07/28/2016 in [place]  Total GAD-7 Score '6 6 18      '$ PHQ2-9    [place] Office Visit from 02/10/2020 in [place] at [facility] Visit from 01/21/2019 in [place] Visit from 01/09/2018 in [place] Visit from 12/14/2016 in [place] from 07/28/2016 in [place]  PHQ-2 Total Score '4 1 1 1 3  '$ PHQ-9 Total Score '8 10 8 '$ -- 18      Flowsheet Row ED from 11/02/2017 in [place] DEPT  C-SSRS RISK CATEGORY High Risk        Review of Systems:  Review of Systems  Musculoskeletal:  Negative for gait problem.  Neurological:  Negative for tremors.  Psychiatric/Behavioral:         Please refer to HPI    Medications: I have reviewed the patient's current medications.  Current Outpatient Medications  Medication Sig Dispense Refill   cariprazine (VRAYLAR) 1.5 MG capsule Take 1 capsule (1.5 mg total) by mouth daily. 30 capsule 2   albuterol (VENTOLIN HFA) 108 (90 Base) MCG/ACT inhaler Inhale into the lungs as needed.     ANORO ELLIPTA 62.5-25 MCG/ACT AEPB INHALE ONE DOSE BY MOUTH DAILY 60 each 0   busPIRone (BUSPAR) 10 MG tablet Take two tablets three times daily for anxiety. 540 tablet 3   FLUBLOK QUADRIVALENT 0.5 ML injection      gabapentin (  NEURONTIN) 400 MG capsule Take 1 capsule (400 mg total) by mouth 2 (two) times daily. 180 capsule 3   gabapentin (NEURONTIN) 600 MG tablet Take 1 tablet (600 mg total) by mouth daily. 90 tablet 3   ipratropium-albuterol (DUONEB) 0.5-2.5 (3) MG/3ML SOLN Take 3 mLs by nebulization every 6 (six) hours as needed. 360 mL 12   montelukast (SINGULAIR) 10 MG tablet Take 1 tablet (10 mg total) by mouth at bedtime. 30 tablet 11    Multiple Vitamin (MULTI-VITAMINS) TABS Take 1 tablet by mouth daily.      omeprazole (PRILOSEC) 20 MG capsule Take 1 capsule (20 mg total) by mouth daily. 90 capsule 3   propranolol (INDERAL) 10 MG tablet Take 1 tablet (10 mg total) by mouth 3 (three) times daily. 90 tablet 3   rosuvastatin (CRESTOR) 20 MG tablet Take 1 tablet (20 mg total) by mouth daily. 90 tablet 3   traZODone (DESYREL) 100 MG tablet Take 2.5 tablets (250 mg total) by mouth at bedtime. 225 tablet 3   venlafaxine XR (EFFEXOR-XR) 150 MG 24 hr capsule Take 1 capsule (150 mg total) by mouth daily with breakfast. 90 capsule 3   venlafaxine XR (EFFEXOR-XR) 37.5 MG 24 hr capsule Take 1 capsule (37.5 mg total) by mouth daily. 90 capsule 3   No current facility-administered medications for this visit.    Medication Side Effects: None  Allergies:  Allergies  Allergen Reactions   Other Nausea And Vomiting    novacaine    Procaine Nausea And Vomiting   Latuda [Lurasidone Hcl] Anxiety   Sulfa Antibiotics Rash    Only in sunlight     Past Medical History:  Diagnosis Date   Coccidioidomycosis, pulmonary (HCC)    Depression    Hx of adenomatous polyp of colon 12/04/2014   Major depressive disorder    since 14, electroconvulsive therapy, and transcranial magnetic stimulation recently for 6 weeks every day    Osteopenia 01/2019   T score -2.1 FRAX 9.6% / 2.1% stable from prior DEXA   Panic attack    PONV (postoperative nausea and vomiting)    VAIN I (vaginal intraepithelial neoplasia grade I) 2015   Positive HPV negative subtype 16, 18/45    Past Medical History, Surgical history, Social history, and Family history were reviewed and updated as appropriate.   Please see review of systems for further details on the patient's review from today.   Objective:   Physical Exam:  There were no vitals taken for this visit.  Physical Exam Constitutional:      General: She is not in acute distress. Musculoskeletal:         General: No deformity.  Neurological:     Mental Status: She is alert and oriented to person, place, and time.     Coordination: Coordination normal.  Psychiatric:        Attention and Perception: Attention and perception normal. She does not perceive auditory or visual hallucinations.        Mood and Affect: Mood normal. Mood is not anxious or depressed. Affect is not labile, blunt, angry or inappropriate.        Speech: Speech normal.        Behavior: Behavior normal.        Thought Content: Thought content normal. Thought content is not paranoid or delusional. Thought content does not include homicidal or suicidal ideation. Thought content does not include homicidal or suicidal plan.        Cognition and Memory:  Cognition and memory normal.        Judgment: Judgment normal.     Comments: Insight intact     Lab Review:     Component Value Date/Time   NA 147 (H) 02/12/2021 1409   NA 136 05/14/2013 1829   K 5.0 02/12/2021 1409   K 3.8 05/14/2013 1829   CL 108 (H) 02/12/2021 1409   CL 98 05/14/2013 1829   CO2 25 02/12/2021 1409   CO2 35 (H) 05/14/2013 1829   GLUCOSE 106 (H) 02/12/2021 1409   GLUCOSE 122 (H) 10/08/2020 1204   GLUCOSE 104 (H) 05/14/2013 1829   BUN 10 02/12/2021 1409   BUN 7 05/14/2013 1829   CREATININE 0.87 02/12/2021 1409   CREATININE 0.83 01/27/2020 0940   CALCIUM 9.3 02/12/2021 1409   CALCIUM 9.1 05/14/2013 1829   PROT 5.9 (L) 02/12/2021 1409   PROT 7.2 05/14/2013 1829   ALBUMIN 4.2 02/12/2021 1409   ALBUMIN 3.5 05/14/2013 1829   AST 16 02/12/2021 1409   AST 16 05/14/2013 1829   ALT 19 02/12/2021 1409   ALT 36 05/14/2013 1829   ALKPHOS 92 02/12/2021 1409   ALKPHOS 83 05/14/2013 1829   BILITOT 0.3 02/12/2021 1409   BILITOT 0.5 05/14/2013 1829   GFRNONAA 86 03/02/2020 1553   GFRNONAA >60 05/14/2013 1829   GFRAA 99 03/02/2020 1553   GFRAA >60 05/14/2013 1829       Component Value Date/Time   WBC 11.8 (H) 08/03/2020 1213   RBC 4.58 08/03/2020 1213    HGB 14.6 08/03/2020 1213   HGB 13.4 05/14/2013 1829   HCT 43.2 08/03/2020 1213   HCT 40.4 05/14/2013 1829   PLT 244.0 08/03/2020 1213   PLT 267 05/14/2013 1829   MCV 94.4 08/03/2020 1213   MCV 95 05/14/2013 1829   MCH 31.6 01/27/2020 0940   MCHC 33.7 08/03/2020 1213   RDW 13.9 08/03/2020 1213   RDW 13.3 05/14/2013 1829   LYMPHSABS 3.4 08/03/2020 1213   LYMPHSABS 3.6 05/14/2013 1829   MONOABS 0.6 08/03/2020 1213   MONOABS 0.6 05/14/2013 1829   EOSABS 0.3 08/03/2020 1213   EOSABS 0.2 05/14/2013 1829   BASOSABS 0.1 08/03/2020 1213   BASOSABS 0.1 05/14/2013 1829    No results found for: "POCLITH", "LITHIUM"   No results found for: "PHENYTOIN", "PHENOBARB", "VALPROATE", "CBMZ"   .res Assessment: Plan:    Plan:  Trazadone '250mg'$  at bedtime Buspar '10mg'$  - '20mg'$  TID Propranolol '10mg'$  TID Effexor XR '150mg'$  daily Effexor XR 37.5 daily Gabapentin '400mg'$  twice daily and '600mg'$  at bedtime  Add Vraylar 1.'5mg'$  daily - samples given  D/C Risperdal '1mg'$  at bedtime - depression, anxiety, paranoia - has weaned off - "drugging me".  Discussed potential metabolic side effects associated with atypical antipsychotics, as well as potential risk for movement side effects. Advised pt to contact office if movement side effects occur.    RTC 4 weeks  Time spent with patient was 40 minutes. Greater than 50% of face to face time with patient was spent on counseling and coordination of care.    Diagnoses and all orders for this visit:  Generalized anxiety disorder  Major depressive disorder, recurrent, severe without psychotic features ([place]) -     cariprazine (VRAYLAR) 1.5 MG capsule; Take 1 capsule (1.5 mg total) by mouth daily.  Insomnia due to other mental disorder  Paranoia (HCC) -     cariprazine (VRAYLAR) 1.5 MG capsule; Take 1 capsule (1.5 mg total) by mouth daily.  Please see After Visit Summary for patient specific instructions.  No future appointments.  No orders of the  defined types were placed in this encounter.   -------------------------------

## 2021-09-20 ENCOUNTER — Telehealth: Payer: Self-pay | Admitting: Internal Medicine

## 2021-09-20 MED ORDER — ALBUTEROL SULFATE HFA 108 (90 BASE) MCG/ACT IN AERS: 2.0000 | INHALATION_SPRAY | Freq: Four times a day (QID) | RESPIRATORY_TRACT | 11 refills | Status: DC | PRN

## 2021-09-20 NOTE — Telephone Encounter (Signed)
Ok to refill her Ventolin hfa inhaler, # 1, ref x 12     inhale 2 puffs every 4-6 hours as needed

## 2021-09-20 NOTE — Telephone Encounter (Signed)
Called and spoke to patient.  She is requesting Rx for ventolin HFA She reports of occ coughing spells, SOB with exertion and wheezingx1w. Denied f/c/s or additional sx.   She uses duoneb TID.   Dr. Annamaria Boots, please advise. Thanks  Current Outpatient Medications on File Prior to Visit  Medication Sig Dispense Refill   albuterol (VENTOLIN HFA) 108 (90 Base) MCG/ACT inhaler Inhale into the lungs as needed.     busPIRone (BUSPAR) 10 MG tablet Take two tablets three times daily for anxiety. 540 tablet 3   cariprazine (VRAYLAR) 1.5 MG capsule Take 1 capsule (1.5 mg total) by mouth daily. 30 capsule 2   FLUBLOK QUADRIVALENT 0.5 ML injection      gabapentin (NEURONTIN) 400 MG capsule Take 1 capsule (400 mg total) by mouth 2 (two) times daily. 180 capsule 3   gabapentin (NEURONTIN) 600 MG tablet Take 1 tablet (600 mg total) by mouth daily. 90 tablet 3   ipratropium-albuterol (DUONEB) 0.5-2.5 (3) MG/3ML SOLN Take 3 mLs by nebulization every 6 (six) hours as needed. 360 mL 12   montelukast (SINGULAIR) 10 MG tablet Take 1 tablet (10 mg total) by mouth at bedtime. 30 tablet 11   Multiple Vitamin (MULTI-VITAMINS) TABS Take 1 tablet by mouth daily.      omeprazole (PRILOSEC) 20 MG capsule Take 1 capsule (20 mg total) by mouth daily. 90 capsule 3   propranolol (INDERAL) 10 MG tablet Take 1 tablet (10 mg total) by mouth 3 (three) times daily. 90 tablet 3   rosuvastatin (CRESTOR) 20 MG tablet Take 1 tablet (20 mg total) by mouth daily. 90 tablet 3   traZODone (DESYREL) 100 MG tablet Take 2.5 tablets (250 mg total) by mouth at bedtime. 225 tablet 3   venlafaxine XR (EFFEXOR-XR) 150 MG 24 hr capsule Take 1 capsule (150 mg total) by mouth daily with breakfast. 90 capsule 3   venlafaxine XR (EFFEXOR-XR) 37.5 MG 24 hr capsule Take 1 capsule (37.5 mg total) by mouth daily. 90 capsule 3   No current facility-administered medications on file prior to visit.    Allergies  Allergen Reactions   Other Nausea And  Vomiting    novacaine    Procaine Nausea And Vomiting   Latuda [Lurasidone Hcl] Anxiety   Sulfa Antibiotics Rash    Only in sunlight

## 2021-09-20 NOTE — Telephone Encounter (Signed)
Ventolin has been sent to preferred pharmacy.  Lm for patient.

## 2021-09-28 ENCOUNTER — Telehealth: Payer: Self-pay | Admitting: Adult Health

## 2021-09-29 ENCOUNTER — Other Ambulatory Visit: Payer: Self-pay

## 2021-09-29 MED ORDER — LYBALVI 5-10 MG PO TABS: ORAL_TABLET | ORAL | 0 refills | Status: DC

## 2021-09-29 NOTE — Telephone Encounter (Signed)
She said depression is not better with vraylar and will like to try lybalvi.Rx sent and I let her know it may need a PA

## 2021-09-29 NOTE — Telephone Encounter (Signed)
LVM to rtc 

## 2021-09-29 NOTE — Telephone Encounter (Signed)
Noted. Ty!

## 2021-09-30 NOTE — Telephone Encounter (Signed)
Noted. Do we need to notify patient?

## 2021-09-30 NOTE — Telephone Encounter (Signed)
Pt informed

## 2021-09-30 NOTE — Telephone Encounter (Signed)
Prior Approval received for Lybalvi effective through 04/03/2022   With Medicare I am not sure what her cost will be is the only issue there may be.

## 2021-09-30 NOTE — Telephone Encounter (Signed)
Prior Authorization received and submitted for LYBALVI with [employer] Part D, pending response

## 2021-10-06 ENCOUNTER — Ambulatory Visit (INDEPENDENT_AMBULATORY_CARE_PROVIDER_SITE_OTHER): Payer: Medicare HMO | Admitting: Radiology

## 2021-10-06 ENCOUNTER — Telehealth: Payer: Self-pay

## 2021-10-06 ENCOUNTER — Other Ambulatory Visit (HOSPITAL_COMMUNITY)
Admission: RE | Admit: 2021-10-06 | Discharge: 2021-10-06 | Disposition: A | Discharge status: Home / Self Care | Payer: Medicare HMO | Source: Ambulatory Visit | Attending: Radiology | Admitting: Radiology

## 2021-10-06 ENCOUNTER — Encounter: Payer: Self-pay | Admitting: Radiology

## 2021-10-06 VITALS — BP 116/60 | Ht 63.5 in | Wt 193.0 lb

## 2021-10-06 DIAGNOSIS — N893 Dysplasia of vagina, unspecified: Secondary | ICD-10-CM

## 2021-10-06 DIAGNOSIS — R339 Retention of urine, unspecified: Secondary | ICD-10-CM | POA: Diagnosis not present

## 2021-10-06 DIAGNOSIS — Z780 Asymptomatic menopausal state: Secondary | ICD-10-CM | POA: Diagnosis not present

## 2021-10-06 DIAGNOSIS — Z01419 Encounter for gynecological examination (general) (routine) without abnormal findings: Secondary | ICD-10-CM

## 2021-10-06 DIAGNOSIS — Z1151 Encounter for screening for human papillomavirus (HPV): Secondary | ICD-10-CM | POA: Diagnosis not present

## 2021-10-06 DIAGNOSIS — Z9189 Other specified personal risk factors, not elsewhere classified: Secondary | ICD-10-CM

## 2021-10-06 DIAGNOSIS — N907 Vulvar cyst: Secondary | ICD-10-CM | POA: Diagnosis not present

## 2021-10-06 DIAGNOSIS — M8580 Other specified disorders of bone density and structure, unspecified site: Secondary | ICD-10-CM | POA: Diagnosis not present

## 2021-10-06 DIAGNOSIS — R69 Illness, unspecified: Secondary | ICD-10-CM | POA: Diagnosis not present

## 2021-10-06 DIAGNOSIS — L309 Dermatitis, unspecified: Secondary | ICD-10-CM | POA: Diagnosis not present

## 2021-10-06 LAB — URINALYSIS, COMPLETE W/RFL CULTURE
Bacteria, UA: NONE SEEN /HPF
Bilirubin Urine: NEGATIVE
Glucose, UA: NEGATIVE
Hyaline Cast: NONE SEEN /LPF
Ketones, ur: NEGATIVE
Leukocyte Esterase: NEGATIVE
Nitrites, Initial: NEGATIVE
Protein, ur: NEGATIVE
RBC / HPF: NONE SEEN /HPF (ref 0–2)
Specific Gravity, Urine: 1.01 (ref 1.001–1.035)
WBC, UA: NONE SEEN /HPF (ref 0–5)
pH: 7 (ref 5.0–8.0)

## 2021-10-06 LAB — NO CULTURE INDICATED

## 2021-10-06 MED ORDER — TRIAMCINOLONE ACETONIDE 0.5 % EX OINT: 1.0000 | TOPICAL_OINTMENT | Freq: Two times a day (BID) | CUTANEOUS | 1 refills | Status: DC

## 2021-10-06 NOTE — Telephone Encounter (Signed)
Urology referral Received: Today Chrzanowski, Annitta Needs, NP  [facility] Triage Please refer to urology for trouble emptying bladder.

## 2021-10-06 NOTE — Progress Notes (Signed)
Crystal Duarte Dec 30, 1960 [id number]   History: Postmenopausal 61 y.o. presents for annual exam. Complains of trouble emptying bladder (has to change positions to empty bladder completely) x 3 months. No UTI symptoms. Complains of rash on abdomen and back (has Dermatology appointment 07/end/23).  Complains of sebaceous cysts on labia, often sore.    Gynecologic History Postmenopausal Last Pap: 2019. Results were: normal, history of VAIN Last mammogram: 2022. Results were: normal Last colonoscopy: 2022 HRT use: no  Obstetric History OB History  Gravida Para Term Preterm AB Living  '2 2       2  '$ SAB IAB Ectopic Multiple Live Births               # Outcome Date GA Lbr Len/2nd Weight Sex Delivery Anes PTL Lv  2 Para           1 Para              The following portions of the patient's history were reviewed and updated as appropriate: allergies, current medications, past family history, past medical history, past social history, past surgical history, and problem list.  Review of Systems Pertinent items noted in HPI and remainder of comprehensive ROS otherwise negative.  Past medical history, past surgical history, family history and social history were all reviewed and documented in the EPIC chart.  Exam:  Vitals:   10/06/21 1316  BP: 116/60  Weight: 193 lb (87.5 kg)  Height: 5' 3.5" (1.613 m)   Body mass index is 33.65 kg/m.  General appearance:  Normal, obese Thyroid:  Symmetrical, normal in size, without palpable masses or nodularity. Respiratory  Auscultation:  Clear without wheezing or rhonchi Cardiovascular  Auscultation:  Regular rate, without rubs, murmurs or gallops  Edema/varicosities:  Not grossly evident Abdominal  Soft,nontender, without masses, guarding or rebound.  Liver/spleen:  No organomegaly noted  Hernia:  None appreciated  Skin  Inspection:  erythematous rash on lower abdomen and mid back Breasts: Examined lying and  sitting.   Right: Without masses, retractions, nipple discharge or axillary adenopathy.   Left: Without masses, retractions, nipple discharge or axillary adenopathy. Genitourinary   Inguinal/mons:  Normal without inguinal adenopathy  External genitalia:  Multiple sebaceous cysts in varying sizes from 69m to 89m BUS/Urethra/Skene's glands:  Normal  Vagina:  Normal appearing with normal color and discharge, no lesions. Atrophy: moderate   Cervix/Uterus:  absent  Adnexa/parametria:     Rt: Normal in size, without masses or tenderness.   Lt: Normal in size, without masses or tenderness.  Anus and perineum: Normal    Patient informed chaperone available to be present for breast and pelvic exam. Patient has requested no chaperone to be present. Patient has been advised what will be completed during breast and pelvic exam.   Assessment/Plan:   1. Well woman exam with routine gynecological exam -Overdue for DEXA and mammogram - smoking cessation  2. Incomplete emptying of bladder - Urology referral - Urinalysis,Complete w/RFL Culture  3. Dermatitis due to unknown cause - Keep appt with dermatology - triamcinolone ointment (KENALOG) 0.5 %; Apply 1 Application topically 2 (two) times daily.  Dispense: 30 g; Refill: 1  4. Dysplasia of vagina, histologically confirmed  - Cytology - PAP( [place])   5. Vulvar cysts Return for removal if they become bothersome   Discussed SBE, colonoscopy and DEXA screening as directed. Recommend 15018m of exercise weekly, including weight bearing exercise. Encouraged the use of seatbelts and sunscreen.  Return in  1 year for annual or sooner prn.  Rubbie Battiest B WHNP-BC, 1:54 PM 10/06/2021

## 2021-10-06 NOTE — Telephone Encounter (Signed)
Referral faxed to [facility] °

## 2021-10-08 LAB — CYTOLOGY - PAP
Comment: NEGATIVE
Diagnosis: NEGATIVE
High risk HPV: NEGATIVE

## 2021-10-12 ENCOUNTER — Other Ambulatory Visit: Payer: Self-pay | Admitting: Radiology

## 2021-10-12 DIAGNOSIS — Z1231 Encounter for screening mammogram for malignant neoplasm of breast: Secondary | ICD-10-CM

## 2021-10-15 ENCOUNTER — Other Ambulatory Visit: Payer: Self-pay

## 2021-10-15 ENCOUNTER — Ambulatory Visit (INDEPENDENT_AMBULATORY_CARE_PROVIDER_SITE_OTHER): Payer: Medicare HMO

## 2021-10-15 ENCOUNTER — Emergency Department (HOSPITAL_COMMUNITY)
Admission: EM | Admit: 2021-10-15 | Discharge: 2021-10-15 | Disposition: A | Discharge status: Home / Self Care | Payer: Medicare HMO | Attending: Emergency Medicine | Admitting: Emergency Medicine

## 2021-10-15 ENCOUNTER — Encounter (HOSPITAL_COMMUNITY): Payer: Self-pay

## 2021-10-15 ENCOUNTER — Emergency Department (HOSPITAL_COMMUNITY): Payer: Medicare HMO

## 2021-10-15 ENCOUNTER — Encounter: Payer: Self-pay | Admitting: Internal Medicine

## 2021-10-15 ENCOUNTER — Encounter: Payer: Self-pay | Admitting: Family Medicine

## 2021-10-15 ENCOUNTER — Ambulatory Visit (INDEPENDENT_AMBULATORY_CARE_PROVIDER_SITE_OTHER): Payer: Medicare HMO | Admitting: Family Medicine

## 2021-10-15 VITALS — BP 118/70 | HR 80 | Temp 98.4°F | Ht 63.5 in | Wt 194.0 lb

## 2021-10-15 DIAGNOSIS — R062 Wheezing: Secondary | ICD-10-CM | POA: Diagnosis not present

## 2021-10-15 DIAGNOSIS — R079 Chest pain, unspecified: Secondary | ICD-10-CM | POA: Insufficient documentation

## 2021-10-15 DIAGNOSIS — R0600 Dyspnea, unspecified: Secondary | ICD-10-CM | POA: Diagnosis not present

## 2021-10-15 DIAGNOSIS — R071 Chest pain on breathing: Secondary | ICD-10-CM

## 2021-10-15 DIAGNOSIS — R918 Other nonspecific abnormal finding of lung field: Secondary | ICD-10-CM | POA: Diagnosis not present

## 2021-10-15 DIAGNOSIS — J449 Chronic obstructive pulmonary disease, unspecified: Secondary | ICD-10-CM | POA: Diagnosis not present

## 2021-10-15 DIAGNOSIS — J40 Bronchitis, not specified as acute or chronic: Secondary | ICD-10-CM

## 2021-10-15 DIAGNOSIS — J441 Chronic obstructive pulmonary disease with (acute) exacerbation: Secondary | ICD-10-CM | POA: Diagnosis not present

## 2021-10-15 DIAGNOSIS — R051 Acute cough: Secondary | ICD-10-CM

## 2021-10-15 DIAGNOSIS — R059 Cough, unspecified: Secondary | ICD-10-CM | POA: Diagnosis not present

## 2021-10-15 DIAGNOSIS — R0602 Shortness of breath: Secondary | ICD-10-CM

## 2021-10-15 DIAGNOSIS — R7989 Other specified abnormal findings of blood chemistry: Secondary | ICD-10-CM | POA: Insufficient documentation

## 2021-10-15 LAB — CBC WITH DIFFERENTIAL/PLATELET
Abs Immature Granulocytes: 0.06 10*3/uL (ref 0.00–0.07)
Basophils Absolute: 0.2 10*3/uL — ABNORMAL HIGH (ref 0.0–0.1)
Basophils Absolute: 0.1 10*3/uL (ref 0.0–0.1)
Basophils Relative: 1.2 % (ref 0.0–3.0)
Basophils Relative: 0 %
Eosinophils Absolute: 0.4 10*3/uL (ref 0.0–0.7)
Eosinophils Absolute: 0.3 10*3/uL (ref 0.0–0.5)
Eosinophils Relative: 2.8 % (ref 0.0–5.0)
Eosinophils Relative: 2 %
HCT: 43.8 % (ref 36.0–46.0)
HCT: 42.7 % (ref 36.0–46.0)
Hemoglobin: 14.3 g/dL (ref 12.0–15.0)
Hemoglobin: 13.8 g/dL (ref 12.0–15.0)
Immature Granulocytes: 0 %
Lymphocytes Relative: 32.3 % (ref 12.0–46.0)
Lymphocytes Relative: 33 %
Lymphs Abs: 4.2 10*3/uL — ABNORMAL HIGH (ref 0.7–4.0)
Lymphs Abs: 4.7 10*3/uL — ABNORMAL HIGH (ref 0.7–4.0)
MCH: 31.6 pg (ref 26.0–34.0)
MCHC: 32.6 g/dL (ref 30.0–36.0)
MCHC: 32.3 g/dL (ref 30.0–36.0)
MCV: 95.9 fl (ref 78.0–100.0)
MCV: 97.7 fL (ref 80.0–100.0)
Monocytes Absolute: 0.8 10*3/uL (ref 0.1–1.0)
Monocytes Absolute: 0.6 10*3/uL (ref 0.1–1.0)
Monocytes Relative: 6.4 % (ref 3.0–12.0)
Monocytes Relative: 4 %
Neutro Abs: 7.5 10*3/uL (ref 1.4–7.7)
Neutro Abs: 8.4 10*3/uL — ABNORMAL HIGH (ref 1.7–7.7)
Neutrophils Relative %: 57.3 % (ref 43.0–77.0)
Neutrophils Relative %: 61 %
Platelets: 260 10*3/uL (ref 150.0–400.0)
Platelets: 255 10*3/uL (ref 150–400)
RBC: 4.56 Mil/uL (ref 3.87–5.11)
RBC: 4.37 MIL/uL (ref 3.87–5.11)
RDW: 14 % (ref 11.5–15.5)
RDW: 13.3 % (ref 11.5–15.5)
WBC: 13.1 10*3/uL — ABNORMAL HIGH (ref 4.0–10.5)
WBC: 14.2 10*3/uL — ABNORMAL HIGH (ref 4.0–10.5)
nRBC: 0 % (ref 0.0–0.2)

## 2021-10-15 LAB — D-DIMER, QUANTITATIVE: D-Dimer, Quant: 0.64 mcg/mL FEU — ABNORMAL HIGH (ref <0.50)

## 2021-10-15 LAB — BASIC METABOLIC PANEL
Anion gap: 10 (ref 5–15)
BUN: 12 mg/dL (ref 6–23)
BUN: 11 mg/dL (ref 8–23)
CO2: 32 mEq/L (ref 19–32)
CO2: 26 mmol/L (ref 22–32)
Calcium: 9.6 mg/dL (ref 8.4–10.5)
Calcium: 9.2 mg/dL (ref 8.9–10.3)
Chloride: 105 mEq/L (ref 96–112)
Chloride: 106 mmol/L (ref 98–111)
Creatinine, Ser: 0.84 mg/dL (ref 0.40–1.20)
Creatinine, Ser: 0.95 mg/dL (ref 0.44–1.00)
GFR, Estimated: >60 mL/min (ref >60)
GFR: 74.54 mL/min (ref >60.00)
Glucose, Bld: 96 mg/dL (ref 70–99)
Glucose, Bld: 160 mg/dL — ABNORMAL HIGH (ref 70–99)
Potassium: 4.5 mEq/L (ref 3.5–5.1)
Potassium: 3.6 mmol/L (ref 3.5–5.1)
Sodium: 142 mEq/L (ref 135–145)
Sodium: 142 mmol/L (ref 135–145)

## 2021-10-15 LAB — POC COVID19 BINAXNOW: SARS Coronavirus 2 Ag: NEGATIVE

## 2021-10-15 LAB — TROPONIN I (HIGH SENSITIVITY)
High Sens Troponin I: 2 ng/L (ref 2–17)
Troponin I (High Sensitivity): <2 ng/L (ref <18)

## 2021-10-15 MED ORDER — ALBUTEROL SULFATE (2.5 MG/3ML) 0.083% IN NEBU
5.0000 mg | INHALATION_SOLUTION | Freq: Once | RESPIRATORY_TRACT | Status: AC
  Administered 2021-10-15: 5 mg via RESPIRATORY_TRACT
  Filled 2021-10-15: qty 6

## 2021-10-15 MED ORDER — METHYLPREDNISOLONE ACETATE 80 MG/ML IJ SUSP: 80.0000 mg | Freq: Once | INTRAMUSCULAR | Status: DC

## 2021-10-15 MED ORDER — IOHEXOL 350 MG/ML SOLN
80.0000 mL | Freq: Once | INTRAVENOUS | Status: AC | PRN
Start: 2021-10-15 — End: 2021-10-15
  Administered 2021-10-15: 80 mL via INTRAVENOUS

## 2021-10-15 MED ORDER — AZITHROMYCIN 250 MG PO TABS: ORAL_TABLET | ORAL | 0 refills | Status: AC

## 2021-10-15 MED ORDER — SODIUM CHLORIDE (PF) 0.9 % IJ SOLN
INTRAMUSCULAR | Status: AC
  Filled 2021-10-15: qty 50

## 2021-10-15 MED ORDER — IPRATROPIUM BROMIDE 0.02 % IN SOLN
0.5000 mg | Freq: Once | RESPIRATORY_TRACT | Status: AC
  Administered 2021-10-15: 0.5 mg via RESPIRATORY_TRACT
  Filled 2021-10-15: qty 2.5

## 2021-10-15 MED ORDER — METHYLPREDNISOLONE ACETATE 40 MG/ML IJ SUSP
80.0000 mg | Freq: Once | INTRAMUSCULAR | Status: AC
  Administered 2021-10-15: 80 mg via INTRAMUSCULAR

## 2021-10-15 MED ORDER — PREDNISONE 10 MG (21) PO TBPK: ORAL_TABLET | Freq: Every day | ORAL | 0 refills | Status: DC

## 2021-10-15 NOTE — Progress Notes (Signed)
Patient called and advised to go to the ED due to elevated D-Dimer (even age adjusted) in addition to chest pain, shortness of breath and cough.

## 2021-10-15 NOTE — ED Provider Notes (Signed)
[facility] DEPT Provider Note   CSN: [id number] Arrival date & time: 10/15/21  1633     History  Chief Complaint  Patient presents with   Shortness of Breath   Cough    Crystal Duarte is a 61 y.o. female history of COPD, here presenting with shortness of breath and cough.  Patient has been coughing for several days.  Patient was diagnosed with bronchitis and was prescribed steroids and Z-Pak and labs were drawn and the office that showed elevated D-dimer. Patient was sent here for CT chest to rule out PE.  Denies any recent travel or history of blood clots  The history is provided by the patient.       Home Medications Prior to Admission medications   Medication Sig Start Date End Date Taking? Authorizing Provider  albuterol (VENTOLIN HFA) 108 (90 Base) MCG/ACT inhaler Inhale into the lungs as needed. 01/11/21   [provider]  albuterol (VENTOLIN HFA) 108 (90 Base) MCG/ACT inhaler Inhale 2 puffs into the lungs every 6 (six) hours as needed for wheezing or shortness of breath. 09/20/21   Deneise Lever, MD  azithromycin (ZITHROMAX) 250 MG tablet Take 2 tablets on day 1, then 1 tablet daily on days 2 through 5 10/15/21 10/20/21  Henson, Vickie L, NP-C  busPIRone (BUSPAR) 10 MG tablet Take two tablets three times daily for anxiety. 05/18/21   Mozingo, Berdie Ogren, NP  Cholecalciferol (VITAMIN D3 PO) Take by mouth.    [provider]  FLUBLOK QUADRIVALENT 0.5 ML injection  02/04/21   [provider]  gabapentin (NEURONTIN) 400 MG capsule Take 1 capsule (400 mg total) by mouth 2 (two) times daily. 05/18/21   Mozingo, Berdie Ogren, NP  gabapentin (NEURONTIN) 600 MG tablet Take 1 tablet (600 mg total) by mouth daily. 05/18/21   Mozingo, Berdie Ogren, NP  ipratropium-albuterol (DUONEB) 0.5-2.5 (3) MG/3ML SOLN Take 3 mLs by nebulization every 6 (six) hours as needed. 03/25/21   Deneise Lever, MD  montelukast  (SINGULAIR) 10 MG tablet Take 1 tablet (10 mg total) by mouth at bedtime. 02/16/21   Martyn Ehrich, NP  Multiple Vitamin (MULTI-VITAMINS) TABS Take 1 tablet by mouth daily.     [provider]  OLANZapine-Samidorphan (LYBALVI) 5-10 MG TABS Take 1 tab at bedtime 09/29/21   Mozingo, Berdie Ogren, NP  omeprazole (PRILOSEC) 20 MG capsule Take 1 capsule (20 mg total) by mouth daily. 11/24/20   Gatha Mayer, MD  predniSONE (STERAPRED UNI-PAK 21 TAB) 10 MG (21) TBPK tablet Take by mouth daily. Start on 10/16/2021 10/15/21   Girtha Rm, NP-C  propranolol (INDERAL) 10 MG tablet Take 1 tablet (10 mg total) by mouth 3 (three) times daily. 05/18/21   Mozingo, Berdie Ogren, NP  rosuvastatin (CRESTOR) 20 MG tablet Take 1 tablet (20 mg total) by mouth daily. 02/15/21   Werner Lean, MD  traZODone (DESYREL) 100 MG tablet Take 2.5 tablets (250 mg total) by mouth at bedtime. 05/18/21   Mozingo, Berdie Ogren, NP  triamcinolone ointment (KENALOG) 0.5 % Apply 1 Application topically 2 (two) times daily. 10/06/21   Chrzanowski, Wende Crease B, NP  venlafaxine XR (EFFEXOR-XR) 150 MG 24 hr capsule Take 1 capsule (150 mg total) by mouth daily with breakfast. 05/18/21   Mozingo, Berdie Ogren, NP  venlafaxine XR (EFFEXOR-XR) 37.5 MG 24 hr capsule Take 1 capsule (37.5 mg total) by mouth daily. 05/18/21   Mozingo, Berdie Ogren, NP  Allergies    Other, Procaine, Latuda [lurasidone hcl], and Sulfa antibiotics    Review of Systems   Review of Systems  Respiratory:  Positive for shortness of breath.   All other systems reviewed and are negative.   Physical Exam Updated Vital Signs BP 123/81   Pulse 71   Temp 98.4 F (36.9 C) (Oral)   Resp (!) 23   Ht 5' 3.5" (1.613 m)   Wt 88 kg   SpO2 92%   BMI 33.83 kg/m  Physical Exam Vitals and nursing note reviewed.  HENT:     Head: Normocephalic.     Mouth/Throat:     Mouth: Mucous membranes are moist.  Eyes:     Extraocular  Movements: Extraocular movements intact.     Pupils: Pupils are equal, round, and reactive to light.  Cardiovascular:     Rate and Rhythm: Normal rate and regular rhythm.  Pulmonary:     Comments: Tachypneic, mild diffuse wheezing. Abdominal:     General: Bowel sounds are normal.     Palpations: Abdomen is soft.  Musculoskeletal:        General: Normal range of motion.     Cervical back: Normal range of motion and neck supple.  Skin:    General: Skin is warm.     Capillary Refill: Capillary refill takes less than 2 seconds.  Neurological:     General: No focal deficit present.     Mental Status: She is alert and oriented to person, place, and time.  Psychiatric:        Mood and Affect: Mood normal.        Behavior: Behavior normal.     ED Results / Procedures / Treatments   Labs (all labs ordered are listed, but only abnormal results are displayed) Labs Reviewed  CBC WITH DIFFERENTIAL/PLATELET - Abnormal; Notable for the following components:      Result Value   WBC 14.2 (*)    Neutro Abs 8.4 (*)    Lymphs Abs 4.7 (*)    All other components within normal limits  BASIC METABOLIC PANEL - Abnormal; Notable for the following components:   Glucose, Bld 160 (*)    All other components within normal limits  TROPONIN I (HIGH SENSITIVITY)  TROPONIN I (HIGH SENSITIVITY)    EKG EKG Interpretation  Date/Time:  Friday October 15 2021 17:06:22 EDT Ventricular Rate:  89 PR Interval:  138 QRS Duration: 102 QT Interval:  397 QTC Calculation: 484 R Axis:   75 Text Interpretation: Sinus rhythm No significant change since last tracing Confirmed by Wandra Arthurs [phone]) on 10/15/2021 7:40:20 PM  Radiology CT Angio Chest PE W and/or Wo Contrast  Result Date: 10/15/2021 CLINICAL DATA:  Pulmonary embolism (PE) suspected, positive D-dimer. Cough, dyspnea. EXAM: CT ANGIOGRAPHY CHEST WITH CONTRAST TECHNIQUE: Multidetector CT imaging of the chest was performed using the standard protocol  during bolus administration of intravenous contrast. Multiplanar CT image reconstructions and MIPs were obtained to evaluate the vascular anatomy. RADIATION DOSE REDUCTION: This exam was performed according to the departmental dose-optimization program which includes automated exposure control, adjustment of the mA and/or kV according to patient size and/or use of iterative reconstruction technique. CONTRAST:  36m OMNIPAQUE IOHEXOL 350 MG/ML SOLN COMPARISON:  None Available. FINDINGS: Cardiovascular: Adequate opacification of the pulmonary arterial tree. No intraluminal filling defect identified to suggest acute pulmonary embolism. Central pulmonary arteries are of normal caliber. No significant coronary artery calcification. Cardiac size within normal limits. No pericardial effusion.  No significant atherosclerotic calcification within the thoracic aorta. No aortic aneurysm. Mediastinum/Nodes: No enlarged mediastinal, hilar, or axillary lymph nodes. Thyroid gland, trachea, and esophagus demonstrate no significant findings. Lungs/Pleura: Status post right upper lobe wedge resection. Lungs are otherwise clear. No pneumothorax or pleural effusion. Central airways are widely patent. Upper Abdomen: No acute abnormality. Musculoskeletal: No acute bone abnormality. No lytic or blastic bone lesion Review of the MIP images confirms the above findings. IMPRESSION: 1. No pulmonary embolism. No acute intrathoracic pathology identified. 2. Status post right upper lobe wedge resection. Electronically Signed   By: Fidela Salisbury M.D.   On: 10/15/2021 19:43   DG Chest 2 View  Result Date: 10/15/2021 CLINICAL DATA:  Shortness of breath wheezing and cough in a 61 year old female. EXAM: CHEST - 2 VIEW COMPARISON:  CT of April 29, 2021. FINDINGS: Trachea is midline. Cardiomediastinal contours and hilar structures are normal. Lungs are clear. No sign of pleural effusion. No pneumothorax. Signs of prior partial lung resection in  the RIGHT upper lobe similar to prior imaging. IMPRESSION: No acute cardiopulmonary disease. Electronically Signed   By: Zetta Bills M.D.   On: 10/15/2021 14:16    Procedures Procedures    Angiocath insertion Performed by: Wandra Arthurs  Consent: Verbal consent obtained. Risks and benefits: risks, benefits and alternatives were discussed Time out: Immediately prior to procedure a "time out" was called to verify the correct patient, procedure, equipment, support staff and site/side marked as required.  Preparation: Patient was prepped and draped in the usual sterile fashion.  Vein Location: R antecube  Ultrasound Guided  Gauge: 20 long   Normal blood return and flush without difficulty Patient tolerance: Patient tolerated the procedure well with no immediate complications.    Medications Ordered in ED Medications  sodium chloride (PF) 0.9 % injection (has no administration in time range)  albuterol (PROVENTIL) (2.5 MG/3ML) 0.083% nebulizer solution 5 mg (5 mg Nebulization Given 10/15/21 1808)  ipratropium (ATROVENT) nebulizer solution 0.5 mg (0.5 mg Nebulization Given 10/15/21 1808)  iohexol (OMNIPAQUE) 350 MG/ML injection 80 mL (80 mLs Intravenous Contrast Given 10/15/21 1933)    ED Course/ Medical Decision Making/ A&P                           Medical Decision Making Shauntea Lok is a 61 y.o. female here with cough and elevated D-dimer.  I think likely bronchitis.  Less likely PE.  Given elevated D-dimer will get CTA chest  8:07 PM I reviewed patient's labs and independently interpreted imaging study.  CTA showed no PE.  Patient was prescribed Z-Pak by PCP already.  Patient also was given steroid shot and also course of steroids by PCP.  At this point she is stable for discharge.  Problems Addressed: Acute cough: acute illness or injury Bronchitis: acute illness or injury  Amount and/or Complexity of Data Reviewed Labs: ordered. Decision-making details documented  in ED Course. Radiology: ordered and independent interpretation performed. Decision-making details documented in ED Course.  Risk Prescription drug management.    Final Clinical Impression(s) / ED Diagnoses Final diagnoses:  None    Rx / DC Orders ED Discharge Orders     None         Drenda Freeze, MD 10/15/21 2009

## 2021-10-15 NOTE — Assessment & Plan Note (Signed)
Negative COVID test in office. EKG shows NSR, rate 66.  No Q waves or T wave abnormalities. Most likely COPD exacerbation.  Depo-Medrol 80 mg IM injection given in office.  Chest x-ray and labs ordered including troponin and D-dimer.  Azithromycin prescribed.  Steroid Dosepak prescribed to start tomorrow.  If she is getting worse or if her labs are significantly abnormal then we will call her to go to to the ED.

## 2021-10-15 NOTE — ED Triage Notes (Signed)
Patient reports that she has had a cough and SOB x 3-4 days. Patient states that she received a steroid shot and feels better. Patient states she had an elevated d-dimer and and PCP sent her to the ED to r/o a blood clot.

## 2021-10-15 NOTE — Patient Instructions (Signed)
You received a steroid injection today in our office.  Please go downstairs for labs and a chest x-ray before you leave today.  I am sending an antibiotic (start today) and oral steroid Dosepak (start tomorrow morning with breakfast)  If your chest pain or shortness of breath are getting worse or if you have any new symptoms then please go to the emergency department or call 911.  I will be in touch with your results.

## 2021-10-15 NOTE — ED Provider Triage Note (Signed)
Emergency Medicine Provider Triage Evaluation Note  Crystal Duarte , a 61 y.o. female  was evaluated in triage.  Pt complains of cough, shortness of breath for the last 3 to 4 days.  Patient was seen evaluated by PCP, patient reports PCP thought it was related to her smoking or some kind of acute bronchitis, endorses she was treated with steroids, however D-dimer was positive.  Patient's age-adjusted limit on D-dimer is 0.62, dimer was elevated at 0.64.  Patient denies any recent travel, history of blood clots.  She denies any other medical history.  Patient reports that she thinks that her left leg is bigger than her right but this has been true for years after some kind of a vascular procedure.  No new or recent swelling. Denies hemoptysis or chest pain. 40 pack year smoking hx.  Review of Systems  Positive: Shob, cough Negative: Chest pain, hemoptysis  Physical Exam  BP (!) 153/83 (BP Location: Right Arm)   Pulse 86   Temp 98.4 F (36.9 C) (Oral)   Resp 18   Ht 5' 3.5" (1.613 m)   Wt 88 kg   SpO2 96%   BMI 33.83 kg/m  Gen:   Awake, no distress   Resp:  Normal effort, minimal wheezing noted on expiration. No respiratory distress MSK:   Moves extremities without difficulty  Other:  Occasional dry cough  Medical Decision Making  Medically screening exam initiated at 4:58 PM.  Appropriate orders placed.  Crystal Duarte was informed that the remainder of the evaluation will be completed by another provider, this initial triage assessment does not replace that evaluation, and the importance of remaining in the ED until their evaluation is complete.  CTA ordered -- labwork performed earlier today   Anselmo Pickler, PA-C 10/15/21 1701

## 2021-10-15 NOTE — Discharge Instructions (Signed)
You should take Z-Pak and steroids as prescribed by your primary care doctor  Use your albuterol at home as needed  See your doctor for follow-up.  Your CT scan did not show any blood clots today.  Return to ER if you have worse shortness of breath, chest pain, fever

## 2021-10-15 NOTE — Progress Notes (Signed)
Subjective:     Patient ID: Crystal Duarte, female    DOB: 12/03/1960, 61 y.o.   MRN: [id number]  Chief Complaint  Patient presents with   Office Visit    Severe cough, wheezing, SOB    HPI Patient is in today for a 3 day history fatigue, chest pain, shortness of breath, wheezing, and dry cough.   No fever, chills, dizziness, abdominal pain, N/V/D.   Works in an assisted living facility with Covid infections.   Health Maintenance Due  Topic Date Due   HIV Screening  Never done   COVID-19 Vaccine (4 - Moderna series) 05/03/2020    Past Medical History:  Diagnosis Date   Coccidioidomycosis, pulmonary (HCC)    COPD (chronic obstructive pulmonary disease) (HCC)    Depression    Hx of adenomatous polyp of colon 12/04/2014   Major depressive disorder    since 14, electroconvulsive therapy, and transcranial magnetic stimulation recently for 6 weeks every day    Osteopenia 01/2019   T score -2.1 FRAX 9.6% / 2.1% stable from prior DEXA   Panic attack    PONV (postoperative nausea and vomiting)    VAIN I (vaginal intraepithelial neoplasia grade I) 2015   Positive HPV negative subtype 16, 18/45    Past Surgical History:  Procedure Laterality Date   ABDOMINAL HYSTERECTOMY     TAH BSO   BILATERAL VATS ABLATION     vats for biopsy due to infection   BREAST EXCISIONAL BIOPSY Right    benign   CATARACT EXTRACTION     COLONOSCOPY  2001   hemorrhoidectomy   HEMORRHOID SURGERY     LUNG SURGERY  2001   VATS surgery    OVARIAN CYST REMOVAL  1970's   ULNAR NERVE TRANSPOSITION Left 12/06/2018   Procedure: LEFT ULNAR NERVE DECOMPRESSION,  ;  Surgeon: Leanora Cover, MD;  Location: [facility];  Service: Orthopedics;  Laterality: Left;   VESICOVAGINAL FISTULA CLOSURE W/ TAH  2005   WRIST ARTHROSCOPY Left 03/13/2018   Procedure: ARTHROSCOPY LEFT WRIST WITH DEBRIDEMENT;  Surgeon: Leanora Cover, MD;  Location: [facility];  Service: Orthopedics;   Laterality: Left;  block    Family History  Problem Relation Age of Onset   Colon polyps Mother    Asthma Mother    Ovarian cancer Mother    Varicose Veins Mother    Colon polyps Father    Heart disease Father    Peripheral vascular disease Father    Lung cancer Maternal Grandmother    Colon cancer Neg Hx    Esophageal cancer Neg Hx    Stomach cancer Neg Hx    Pancreatic cancer Neg Hx    Liver disease Neg Hx     Social History   Socioeconomic History   Marital status: Divorced    Spouse name: Not on file   Number of children: 2   Years of education: Not on file   Highest education level: Not on file  Occupational History   Occupation: Herbalist    Comment: disability    Employer: [employer]  Tobacco Use   Smoking status: Some Days    Packs/day: 0.50    Years: 42.00    Total pack years: 21.00    Types: Cigarettes   Smokeless tobacco: Never   Tobacco comments:    1/2 pack a day [facility] 03/25/21  Vaping Use   Vaping Use: Never used  Substance and Sexual Activity   Alcohol  use: No    Alcohol/week: 0.0 standard drinks of alcohol   Drug use: No   Sexual activity: Not Currently    Birth control/protection: Surgical    Comment: 1st intercourse 61 yo-More than 5 partners  Other Topics Concern   Not on file  Social History Narrative   Lives with son, daughter in Sports [employer] and 4 grandchildren    Social Determinants of Health   Financial Resource Strain: Not on file  Food Insecurity: Not on file  Transportation Needs: Not on file  Physical Activity: Not on file  Stress: Not on file  Social Connections: Not on file  Intimate Partner Violence: Not on file    Outpatient Medications Prior to Visit  Medication Sig Dispense Refill   albuterol (VENTOLIN HFA) 108 (90 Base) MCG/ACT inhaler Inhale into the lungs as needed.     albuterol (VENTOLIN HFA) 108 (90 Base) MCG/ACT inhaler Inhale 2 puffs into the lungs every 6 (six) hours as needed for wheezing or shortness of  breath. 18 g 11   busPIRone (BUSPAR) 10 MG tablet Take two tablets three times daily for anxiety. 540 tablet 3   Cholecalciferol (VITAMIN D3 PO) Take by mouth.     FLUBLOK QUADRIVALENT 0.5 ML injection      gabapentin (NEURONTIN) 400 MG capsule Take 1 capsule (400 mg total) by mouth 2 (two) times daily. 180 capsule 3   gabapentin (NEURONTIN) 600 MG tablet Take 1 tablet (600 mg total) by mouth daily. 90 tablet 3   ipratropium-albuterol (DUONEB) 0.5-2.5 (3) MG/3ML SOLN Take 3 mLs by nebulization every 6 (six) hours as needed. 360 mL 12   montelukast (SINGULAIR) 10 MG tablet Take 1 tablet (10 mg total) by mouth at bedtime. 30 tablet 11   Multiple Vitamin (MULTI-VITAMINS) TABS Take 1 tablet by mouth daily.      OLANZapine-Samidorphan (LYBALVI) 5-10 MG TABS Take 1 tab at bedtime 30 tablet 0   omeprazole (PRILOSEC) 20 MG capsule Take 1 capsule (20 mg total) by mouth daily. 90 capsule 3   propranolol (INDERAL) 10 MG tablet Take 1 tablet (10 mg total) by mouth 3 (three) times daily. 90 tablet 3   rosuvastatin (CRESTOR) 20 MG tablet Take 1 tablet (20 mg total) by mouth daily. 90 tablet 3   traZODone (DESYREL) 100 MG tablet Take 2.5 tablets (250 mg total) by mouth at bedtime. 225 tablet 3   triamcinolone ointment (KENALOG) 0.5 % Apply 1 Application topically 2 (two) times daily. 30 g 1   venlafaxine XR (EFFEXOR-XR) 150 MG 24 hr capsule Take 1 capsule (150 mg total) by mouth daily with breakfast. 90 capsule 3   venlafaxine XR (EFFEXOR-XR) 37.5 MG 24 hr capsule Take 1 capsule (37.5 mg total) by mouth daily. 90 capsule 3   cariprazine (VRAYLAR) 1.5 MG capsule Take 1 capsule (1.5 mg total) by mouth daily. 30 capsule 2   No facility-administered medications prior to visit.    Allergies  Allergen Reactions   Other Nausea And Vomiting    novacaine    Procaine Nausea And Vomiting   Latuda [Lurasidone Hcl] Anxiety   Sulfa Antibiotics Rash    Only in sunlight     ROS     Objective:    Physical  Exam Constitutional:      General: She is not in acute distress.    Appearance: Normal appearance. She is ill-appearing. She is not diaphoretic.  HENT:     Right Ear: Tympanic membrane and ear canal normal.  Left Ear: Tympanic membrane and ear canal normal.     Mouth/Throat:     Mouth: Mucous membranes are moist.  Eyes:     Conjunctiva/sclera: Conjunctivae normal.     Pupils: Pupils are equal, round, and reactive to light.  Cardiovascular:     Rate and Rhythm: Normal rate and regular rhythm.  Pulmonary:     Effort: Tachypnea present.     Breath sounds: Wheezing and rhonchi present.     Comments: Wheezing throughout and scattered rhonchi Chest:     Chest wall: Tenderness present. No crepitus.       Comments: Chest wall TTP Musculoskeletal:     Cervical back: Normal range of motion and neck supple. No tenderness.     Right lower leg: No edema.     Left lower leg: No edema.  Lymphadenopathy:     Cervical: No cervical adenopathy.  Skin:    General: Skin is warm and dry.  Neurological:     General: No focal deficit present.     Mental Status: She is alert and oriented to person, place, and time.  Psychiatric:        Mood and Affect: Mood normal.        Speech: Speech normal.        Behavior: Behavior normal.        Cognition and Memory: Cognition normal.     BP 118/70 (BP Location: Left Arm, Patient Position: Sitting, Cuff Size: Large)   Pulse 80   Temp 98.4 F (36.9 C) (Oral)   Ht 5' 3.5" (1.613 m)   Wt 194 lb (88 kg)   SpO2 96%   BMI 33.83 kg/m  Wt Readings from Last 3 Encounters:  10/15/21 194 lb (88 kg)  10/06/21 193 lb (87.5 kg)  07/08/21 185 lb (83.9 kg)   Negative Covid  EKG shows NSR, rate 66. No Q waves or T wave abnormalities  Deop-Medrol 80 mg IM in office.     Assessment & Plan:   Problem List Items Addressed This Visit       Respiratory   COPD  GOLD 2/ still smoking    Relevant Medications   azithromycin (ZITHROMAX) 250 MG tablet    predniSONE (STERAPRED UNI-PAK 21 TAB) 10 MG (21) TBPK tablet   Other Relevant Orders   DG Chest 2 View (Completed)     Other   Chest pain on breathing    Negative COVID test in office. EKG shows NSR, rate 66.  No Q waves or T wave abnormalities. Most likely COPD exacerbation.  Depo-Medrol 80 mg IM injection given in office.  Chest x-ray and labs ordered including troponin and D-dimer.  Azithromycin prescribed.  Steroid Dosepak prescribed to start tomorrow.  If she is getting worse or if her labs are significantly abnormal then we will call her to go to to the ED.      Relevant Orders   DG Chest 2 View (Completed)   CBC with Differential/Platelet (Completed)   Basic metabolic panel (Completed)   D-Dimer, Quantitative (Completed)   Troponin I (High Sensitivity) (Completed)   EKG 12-Lead   Cough   Relevant Medications   azithromycin (ZITHROMAX) 250 MG tablet   Other Relevant Orders   POC COVID-19 (Completed)   DG Chest 2 View (Completed)   CBC with Differential/Platelet (Completed)   Basic metabolic panel (Completed)   D-Dimer, Quantitative (Completed)   Shortness of breath - Primary    Negative COVID test in office. EKG shows NSR,  rate 66.  No Q waves or T wave abnormalities. Most likely COPD exacerbation.  Depo-Medrol 80 mg IM injection given in office.  Chest x-ray and labs ordered including troponin and D-dimer.  Azithromycin prescribed.  Steroid Dosepak prescribed to start tomorrow.  If she is getting worse or if her labs are significantly abnormal then we will call her to go to to the ED.      Relevant Orders   DG Chest 2 View (Completed)   CBC with Differential/Platelet (Completed)   Basic metabolic panel (Completed)   D-Dimer, Quantitative (Completed)   Troponin I (High Sensitivity) (Completed)   EKG 12-Lead   Other Visit Diagnoses     COPD exacerbation (HCC)       Relevant Medications   azithromycin (ZITHROMAX) 250 MG tablet   predniSONE (STERAPRED UNI-PAK 21 TAB)  10 MG (21) TBPK tablet   methylPREDNISolone acetate (DEPO-MEDROL) injection 80 mg (Completed)       I have discontinued Lattie Haw R. Denherder's cariprazine. I am also having her start on azithromycin and predniSONE. Additionally, I am having her maintain her Multi-Vitamins, omeprazole, albuterol, rosuvastatin, montelukast, ipratropium-albuterol, Flublok Quadrivalent, busPIRone, venlafaxine XR, venlafaxine XR, propranolol, traZODone, gabapentin, gabapentin, albuterol, Lybalvi, Cholecalciferol (VITAMIN D3 PO), and triamcinolone ointment. We administered methylPREDNISolone acetate.  Meds ordered this encounter  Medications   azithromycin (ZITHROMAX) 250 MG tablet    Sig: Take 2 tablets on day 1, then 1 tablet daily on days 2 through 5    Dispense:  6 tablet    Refill:  0    Order Specific Question:   Supervising Provider    Answer:   Pricilla Holm A [[id number]]   predniSONE (STERAPRED UNI-PAK 21 TAB) 10 MG (21) TBPK tablet    Sig: Take by mouth daily. Start on 10/16/2021    Dispense:  21 tablet    Refill:  0    Order Specific Question:   Supervising Provider    Answer:   Pricilla Holm A [[id number]]   DISCONTD: methylPREDNISolone acetate (DEPO-MEDROL) injection 80 mg   methylPREDNISolone acetate (DEPO-MEDROL) injection 80 mg

## 2021-10-18 ENCOUNTER — Ambulatory Visit: Payer: Medicare HMO | Admitting: Adult Health

## 2021-10-20 ENCOUNTER — Ambulatory Visit (INDEPENDENT_AMBULATORY_CARE_PROVIDER_SITE_OTHER): Payer: Medicare HMO | Admitting: Radiology

## 2021-10-20 VITALS — BP 164/102

## 2021-10-20 DIAGNOSIS — L728 Other follicular cysts of the skin and subcutaneous tissue: Secondary | ICD-10-CM

## 2021-10-20 DIAGNOSIS — N907 Vulvar cyst: Secondary | ICD-10-CM | POA: Diagnosis not present

## 2021-10-20 NOTE — Progress Notes (Addendum)
   Crystal Duarte September 10, 1960 [id number]   History: Postmenopausal 61 y.o. presents with complaints of sebaceous/epidermal cysts on labia, often sore, especially on the left labia. She consents to I+D today.   Gynecologic History Postmenopausal Last Pap: 2019. Results were: normal, history of VAIN Last mammogram: 2022. Results were: normal Last colonoscopy: 2022 HRT use: no  Obstetric History OB History  Gravida Para Term Preterm AB Living  '2 2       2  '$ SAB IAB Ectopic Multiple Live Births               # Outcome Date GA Lbr Len/2nd Weight Sex Delivery Anes PTL Lv  2 Para           1 Para              The following portions of the patient's history were reviewed and updated as appropriate: allergies, current medications, past family history, past medical history, past social history, past surgical history, and problem list.  Review of Systems Pertinent items noted in HPI and remainder of comprehensive ROS otherwise negative.  Past medical history, past surgical history, family history and social history were all reviewed and documented in the EPIC chart.  Exam:  Vitals:   10/20/21 1313  BP: (!) 164/102   There is no height or weight on file to calculate BMI.  General appearance:  Normal, obese  Genitourinary   Inguinal/mons:  Normal without inguinal adenopathy  Vulva: Multiple steatocystomas present in varying sizes. Patient pointed out which were causing discomfort and attention was drawn to those for removal.  After obtaining consent area was cleansed, hurricane spray applied. Individually 11 sebaceous cysts varying in size from 13m to 940mlanced with an 11 blade scalpel and evacuated with gentle pressure. A yellow, waxy, cheese like substance was expressed.Patient tolerated well. Any bleeding was controlled with pressure.  Crystal JewelsCMA present for the visit  Assessment/Plan:   1. Vulvar cysts  2. Steatocystoma Tolerated well Salt water soaks to  facilitate healing  Crystal Duarte B WHNP-BC, 1:50 PM 10/20/2021

## 2021-10-21 ENCOUNTER — Ambulatory Visit: Payer: Medicare HMO

## 2021-10-26 ENCOUNTER — Ambulatory Visit
Admission: RE | Admit: 2021-10-26 | Discharge: 2021-10-26 | Disposition: A | Discharge status: Home / Self Care | Payer: Medicare HMO | Source: Ambulatory Visit | Attending: Radiology | Admitting: Radiology

## 2021-10-26 DIAGNOSIS — Z1231 Encounter for screening mammogram for malignant neoplasm of breast: Secondary | ICD-10-CM

## 2021-11-01 NOTE — Telephone Encounter (Signed)
[facility] scheduler called her but patient is not ready to schedule. She will call them back when she is ready to schedule

## 2021-11-02 ENCOUNTER — Other Ambulatory Visit: Payer: Self-pay | Admitting: Adult Health

## 2021-11-03 NOTE — Telephone Encounter (Signed)
Pt is scheduled 8/14

## 2021-11-03 NOTE — Telephone Encounter (Signed)
Please call to schedule an appt. Last seen 6/15 with RTC in 4 weeks, was a no show.

## 2021-11-12 ENCOUNTER — Encounter (HOSPITAL_BASED_OUTPATIENT_CLINIC_OR_DEPARTMENT_OTHER): Payer: Self-pay

## 2021-11-12 ENCOUNTER — Emergency Department (HOSPITAL_BASED_OUTPATIENT_CLINIC_OR_DEPARTMENT_OTHER): Payer: Medicare HMO | Admitting: Radiology

## 2021-11-12 ENCOUNTER — Emergency Department (HOSPITAL_BASED_OUTPATIENT_CLINIC_OR_DEPARTMENT_OTHER)
Admission: EM | Admit: 2021-11-12 | Discharge: 2021-11-12 | Disposition: A | Discharge status: Home / Self Care | Payer: Medicare HMO | Attending: Emergency Medicine | Admitting: Emergency Medicine

## 2021-11-12 ENCOUNTER — Other Ambulatory Visit: Payer: Self-pay

## 2021-11-12 ENCOUNTER — Encounter (HOSPITAL_COMMUNITY): Payer: Self-pay

## 2021-11-12 DIAGNOSIS — R69 Illness, unspecified: Secondary | ICD-10-CM | POA: Diagnosis not present

## 2021-11-12 DIAGNOSIS — J441 Chronic obstructive pulmonary disease with (acute) exacerbation: Secondary | ICD-10-CM | POA: Insufficient documentation

## 2021-11-12 DIAGNOSIS — F17210 Nicotine dependence, cigarettes, uncomplicated: Secondary | ICD-10-CM | POA: Insufficient documentation

## 2021-11-12 DIAGNOSIS — R059 Cough, unspecified: Secondary | ICD-10-CM | POA: Diagnosis present

## 2021-11-12 DIAGNOSIS — R0602 Shortness of breath: Secondary | ICD-10-CM | POA: Diagnosis not present

## 2021-11-12 DIAGNOSIS — U071 COVID-19: Secondary | ICD-10-CM | POA: Diagnosis not present

## 2021-11-12 LAB — CBC WITH DIFFERENTIAL/PLATELET
Abs Immature Granulocytes: 0.04 10*3/uL (ref 0.00–0.07)
Basophils Absolute: 0 10*3/uL (ref 0.0–0.1)
Basophils Relative: 1 %
Eosinophils Absolute: 0.1 10*3/uL (ref 0.0–0.5)
Eosinophils Relative: 1 %
HCT: 41.9 % (ref 36.0–46.0)
Hemoglobin: 14 g/dL (ref 12.0–15.0)
Immature Granulocytes: 1 %
Lymphocytes Relative: 29 %
Lymphs Abs: 2.4 10*3/uL (ref 0.7–4.0)
MCH: 32.3 pg (ref 26.0–34.0)
MCHC: 33.4 g/dL (ref 30.0–36.0)
MCV: 96.8 fL (ref 80.0–100.0)
Monocytes Absolute: 1.1 10*3/uL — ABNORMAL HIGH (ref 0.1–1.0)
Monocytes Relative: 13 %
Neutro Abs: 4.5 10*3/uL (ref 1.7–7.7)
Neutrophils Relative %: 55 %
Platelets: 202 10*3/uL (ref 150–400)
RBC: 4.33 MIL/uL (ref 3.87–5.11)
RDW: 13.8 % (ref 11.5–15.5)
WBC: 8.1 10*3/uL (ref 4.0–10.5)
nRBC: 0 % (ref 0.0–0.2)

## 2021-11-12 LAB — COMPREHENSIVE METABOLIC PANEL
ALT: 16 U/L (ref 0–44)
AST: 18 U/L (ref 15–41)
Albumin: 4.2 g/dL (ref 3.5–5.0)
Alkaline Phosphatase: 60 U/L (ref 38–126)
Anion gap: 11 (ref 5–15)
BUN: 15 mg/dL (ref 8–23)
CO2: 26 mmol/L (ref 22–32)
Calcium: 9.1 mg/dL (ref 8.9–10.3)
Chloride: 102 mmol/L (ref 98–111)
Creatinine, Ser: 0.72 mg/dL (ref 0.44–1.00)
GFR, Estimated: >60 mL/min (ref >60)
Glucose, Bld: 98 mg/dL (ref 70–99)
Potassium: 4.1 mmol/L (ref 3.5–5.1)
Sodium: 139 mmol/L (ref 135–145)
Total Bilirubin: 0.5 mg/dL (ref 0.3–1.2)
Total Protein: 7 g/dL (ref 6.5–8.1)

## 2021-11-12 LAB — RESP PANEL BY RT-PCR (FLU A&B, COVID) ARPGX2
Influenza A by PCR: NEGATIVE
Influenza B by PCR: NEGATIVE
SARS Coronavirus 2 by RT PCR: POSITIVE — AB

## 2021-11-12 MED ORDER — ALBUTEROL SULFATE (2.5 MG/3ML) 0.083% IN NEBU
INHALATION_SOLUTION | RESPIRATORY_TRACT | Status: AC
  Filled 2021-11-12: qty 3

## 2021-11-12 MED ORDER — ACETAMINOPHEN 325 MG PO TABS
650.0000 mg | ORAL_TABLET | Freq: Once | ORAL | Status: AC
  Administered 2021-11-12: 650 mg via ORAL
  Filled 2021-11-12: qty 2

## 2021-11-12 MED ORDER — IPRATROPIUM-ALBUTEROL 0.5-2.5 (3) MG/3ML IN SOLN: 3.0000 mL | Freq: Four times a day (QID) | RESPIRATORY_TRACT | 1 refills | Status: DC | PRN

## 2021-11-12 MED ORDER — MAGNESIUM SULFATE 2 GM/50ML IV SOLN
2.0000 g | Freq: Once | INTRAVENOUS | Status: AC
Start: 2021-11-12 — End: 2021-11-12
  Administered 2021-11-12: 2 g via INTRAVENOUS
  Filled 2021-11-12: qty 50

## 2021-11-12 MED ORDER — IPRATROPIUM-ALBUTEROL 0.5-2.5 (3) MG/3ML IN SOLN
3.0000 mL | Freq: Once | RESPIRATORY_TRACT | Status: AC
  Administered 2021-11-12: 3 mL via RESPIRATORY_TRACT
  Filled 2021-11-12: qty 3

## 2021-11-12 MED ORDER — ALBUTEROL SULFATE (2.5 MG/3ML) 0.083% IN NEBU
2.5000 mg | INHALATION_SOLUTION | Freq: Once | RESPIRATORY_TRACT | Status: AC
Start: 2021-11-12 — End: 2021-11-12
  Administered 2021-11-12: 2.5 mg via RESPIRATORY_TRACT

## 2021-11-12 MED ORDER — ALBUTEROL SULFATE HFA 108 (90 BASE) MCG/ACT IN AERS: 2.0000 | INHALATION_SPRAY | RESPIRATORY_TRACT | 0 refills | Status: AC | PRN

## 2021-11-12 MED ORDER — MOLNUPIRAVIR EUA 200MG CAPSULE
4.0000 | ORAL_CAPSULE | Freq: Two times a day (BID) | ORAL | Status: DC
Start: 2021-11-12 — End: 2021-11-12
  Administered 2021-11-12: 800 mg via ORAL
  Filled 2021-11-12: qty 4

## 2021-11-12 MED ORDER — PREDNISONE 20 MG PO TABS: 60.0000 mg | ORAL_TABLET | Freq: Every day | ORAL | 0 refills | Status: AC

## 2021-11-12 MED ORDER — DEXAMETHASONE SODIUM PHOSPHATE 10 MG/ML IJ SOLN
10.0000 mg | Freq: Once | INTRAMUSCULAR | Status: AC
  Administered 2021-11-12: 10 mg via INTRAVENOUS
  Filled 2021-11-12: qty 1

## 2021-11-12 MED ORDER — ONDANSETRON HCL 4 MG/2ML IJ SOLN
4.0000 mg | Freq: Once | INTRAMUSCULAR | Status: AC
  Administered 2021-11-12: 4 mg via INTRAVENOUS
  Filled 2021-11-12: qty 2

## 2021-11-12 MED ORDER — ALBUTEROL SULFATE HFA 108 (90 BASE) MCG/ACT IN AERS: 1.0000 | INHALATION_SPRAY | Freq: Once | RESPIRATORY_TRACT | Status: DC

## 2021-11-12 MED ORDER — MOLNUPIRAVIR EUA 200MG CAPSULE: 4.0000 | ORAL_CAPSULE | Freq: Two times a day (BID) | ORAL | 0 refills | Status: DC

## 2021-11-12 NOTE — Discharge Instructions (Addendum)
A prescription for prednisone has been sent to your pharmacy.  Take 3 tablets daily for the next 5 days.  Always take plenty of food and water before taking these, is taking these on an empty stomach can result in an upset stomach.  I have also refilled prescriptions for your albuterol and DuoNeb (albuterol combo) for your nebulizer.  You may utilize your DuoNeb nebulizer once every 6 hours.  Albuterol inhaler may be used in between nebulizer sessions as needed.  On antiviral by the name of Ivan Croft has also been provided for you.  Take 4 capsules twice a day (8 capsules per day total) for a grand total of 5 days.  Please call to schedule follow-up with your PCP at the beginning of next week for reevaluation.  Return to the ED for new or worsening symptoms as discussed.

## 2021-11-12 NOTE — Progress Notes (Signed)
EMT concerned with patient's O2 desaturation periods. RT spoke with medic, Shirlean Mylar, about these episodes prior to PA.  During patient ED encounter, she had episodes of desaturations on the monitor around 84-87%, but O2 has remained 90-94% other than these desaturation periods. Patient has been treated with nebs in addition to other treatments. Spoke with PA concerning the desaturation alarms; per PA, patient is feeling better and BBS improved. PA okay with discharge plan at this time.  Patient does have a history of COPD, continues to smoke, and has been diagnosed COVID +.

## 2021-11-12 NOTE — ED Triage Notes (Signed)
Patient here POV from Home.  Endorses URI Symptoms such as Congestion, Dry Cough, Body Aches, Chills, CP for approximately 1-2 Days.  No Confirmed Fevers. History of Bronchitis.   NAD Noted during Triage. A&Ox4. GCS 15. Ambulatory

## 2021-11-12 NOTE — ED Provider Notes (Signed)
[facility] EMERGENCY DEPT Provider Note   CSN: [id number] Arrival date & time: 11/12/21  1053     History  Chief Complaint  Patient presents with   Cough    Crystal Duarte is a 61 y.o. female with history of advanced COPD, chronic tobacco use, depression, osteopenia, panic attacks.  Presenting to the ED today due to 2 to 3 days of upper respiratory symptoms such as congestion, cough, wheezing, body aches, chills.  Also with some chest soreness with repetitive coughing.  States she got up in the melanite to go to the bathroom, also felt lightheaded with her wheezing, felt as if she might pass out.  Was recently treated for bronchitis with a Medrol Dosepak a few weeks ago.  No known recent sick contacts.  Denies fever or stiff neck.  Denies headache or vision changes.  Lives home by herself.  No Hx of DMT2.  The history is provided by the patient and medical records.  Cough     Home Medications Prior to Admission medications   Medication Sig Start Date End Date Taking? Authorizing Provider  molnupiravir EUA (LAGEVRIO) 200 mg CAPS capsule Take 4 capsules (800 mg total) by mouth 2 (two) times daily for 4 days. 11/12/21 11/16/21 Yes Prince Rome, PA-C  predniSONE (DELTASONE) 20 MG tablet Take 3 tablets (60 mg total) by mouth daily for 5 days. 11/12/21 11/17/21 Yes Prince Rome, PA-C  albuterol (VENTOLIN HFA) 108 (90 Base) MCG/ACT inhaler Inhale 2 puffs into the lungs every 4 (four) hours as needed. 7/34/28   Prince Rome, PA-C  busPIRone (BUSPAR) 10 MG tablet Take two tablets three times daily for anxiety. 05/18/21   Mozingo, Berdie Ogren, NP  Cholecalciferol (VITAMIN D3 PO) Take by mouth.    [provider]  FLUBLOK QUADRIVALENT 0.5 ML injection  02/04/21   [provider]  gabapentin (NEURONTIN) 400 MG capsule Take 1 capsule (400 mg total) by mouth 2 (two) times daily. 05/18/21   Mozingo, Berdie Ogren, NP  gabapentin (NEURONTIN)  600 MG tablet Take 1 tablet (600 mg total) by mouth daily. 05/18/21   Mozingo, Berdie Ogren, NP  ipratropium-albuterol (DUONEB) 0.5-2.5 (3) MG/3ML SOLN Take 3 mLs by nebulization every 6 (six) hours as needed. 7/68/11   Prince Rome, PA-C  montelukast (SINGULAIR) 10 MG tablet Take 1 tablet (10 mg total) by mouth at bedtime. 02/16/21   Martyn Ehrich, NP  Multiple Vitamin (MULTI-VITAMINS) TABS Take 1 tablet by mouth daily.     [provider]  OLANZapine-Samidorphan (LYBALVI) 5-10 MG TABS TAKE 1 TABLET BY MOUTH AT BEDTIME 11/04/21   Mozingo, Berdie Ogren, NP  omeprazole (PRILOSEC) 20 MG capsule Take 1 capsule (20 mg total) by mouth daily. 11/24/20   Gatha Mayer, MD  propranolol (INDERAL) 10 MG tablet Take 1 tablet (10 mg total) by mouth 3 (three) times daily. 05/18/21   Mozingo, Berdie Ogren, NP  rosuvastatin (CRESTOR) 20 MG tablet Take 1 tablet (20 mg total) by mouth daily. 02/15/21   Werner Lean, MD  traZODone (DESYREL) 100 MG tablet Take 2.5 tablets (250 mg total) by mouth at bedtime. 05/18/21   Mozingo, Berdie Ogren, NP  triamcinolone ointment (KENALOG) 0.5 % Apply 1 Application topically 2 (two) times daily. 10/06/21   Chrzanowski, Wende Crease B, NP  venlafaxine XR (EFFEXOR-XR) 150 MG 24 hr capsule Take 1 capsule (150 mg total) by mouth daily with breakfast. 05/18/21   Mozingo, Berdie Ogren, NP  venlafaxine XR (EFFEXOR-XR) 37.5  MG 24 hr capsule Take 1 capsule (37.5 mg total) by mouth daily. 05/18/21   Mozingo, Berdie Ogren, NP      Allergies    Other, Procaine, Latuda [lurasidone hcl], and Sulfa antibiotics    Review of Systems   Review of Systems  Respiratory:  Positive for cough.     Physical Exam Updated Vital Signs BP 129/76 (BP Location: Right Arm)   Pulse 76   Temp 99.2 F (37.3 C) (Oral)   Resp 16   Ht 5' 3.5" (1.613 m)   Wt 88 kg   SpO2 91%   BMI 33.83 kg/m  Physical Exam Vitals and nursing note reviewed.  Constitutional:       General: She is not in acute distress.    Appearance: She is well-developed. She is obese. She is ill-appearing. She is not toxic-appearing or diaphoretic.  HENT:     Head: Normocephalic and atraumatic.     Nose: Congestion present.     Mouth/Throat:     Mouth: Mucous membranes are moist.     Pharynx: Oropharynx is clear.  Eyes:     General: No scleral icterus.    Conjunctiva/sclera: Conjunctivae normal.  Neck:     Comments: Very supple on exam, no meningismus Cardiovascular:     Rate and Rhythm: Normal rate and regular rhythm.     Pulses: Normal pulses.     Heart sounds: No murmur heard. Pulmonary:     Effort: No respiratory distress.     Breath sounds: Wheezing present. No rales.     Comments: Able to communicate without some difficulty for long periods.  Equal chest rise.  Mild increased work of breathing. Chest:     Chest wall: Tenderness (Chest wall, mild tenderness) present.  Abdominal:     Palpations: Abdomen is soft.     Tenderness: There is no abdominal tenderness.  Musculoskeletal:        General: No swelling.     Cervical back: Neck supple. No rigidity.  Skin:    General: Skin is warm and dry.     Capillary Refill: Capillary refill takes less than 2 seconds.     Coloration: Skin is not jaundiced or pale.     Findings: No erythema.  Neurological:     Mental Status: She is alert and oriented to person, place, and time.  Psychiatric:        Mood and Affect: Mood normal.     ED Results / Procedures / Treatments   Labs (all labs ordered are listed, but only abnormal results are displayed) Labs Reviewed  RESP PANEL BY RT-PCR (FLU A&B, COVID) ARPGX2 - Abnormal; Notable for the following components:      Result Value   SARS Coronavirus 2 by RT PCR POSITIVE (*)    All other components within normal limits  CBC WITH DIFFERENTIAL/PLATELET - Abnormal; Notable for the following components:   Monocytes Absolute 1.1 (*)    All other components within normal limits   COMPREHENSIVE METABOLIC PANEL    EKG EKG Interpretation  Date/Time:  Friday November 12 2021 11:28:42 EDT Ventricular Rate:  82 PR Interval:  134 QRS Duration: 78 QT Interval:  380 QTC Calculation: 443 R Axis:   125 Text Interpretation: Normal sinus rhythm Left posterior fascicular block Abnormal ECG When compared with ECG of 15-Oct-2021 17:06, PREVIOUS ECG IS PRESENT Confirmed by Octaviano Glow [phone]) on 11/12/2021 1:52:14 PM  Radiology DG Chest 2 View  Result Date: 11/12/2021 CLINICAL DATA:  SOB EXAM:  CHEST - 2 VIEW COMPARISON:  October 15, 2021 FINDINGS: The heart size and mediastinal contours are within normal limits stable. Again seen are the partial lung resection changes in the right upper lobe. No consolidation, pleural effusion or vascular congestion. The visualized skeletal structures are unremarkable. IMPRESSION: No active cardiopulmonary disease. Electronically Signed   By: Frazier Richards M.D.   On: 11/12/2021 12:36    Procedures Procedures    Medications Ordered in ED Medications  molnupiravir EUA (LAGEVRIO) capsule 800 mg (800 mg Oral Given 11/12/21 1625)  magnesium sulfate IVPB 2 g 50 mL (2 g Intravenous New Bag/Given 11/12/21 1603)  albuterol (VENTOLIN HFA) 108 (90 Base) MCG/ACT inhaler 1 puff (has no administration in time range)  ipratropium-albuterol (DUONEB) 0.5-2.5 (3) MG/3ML nebulizer solution 3 mL (3 mLs Nebulization Given 11/12/21 1440)  acetaminophen (TYLENOL) tablet 650 mg (650 mg Oral Given 11/12/21 1419)  dexamethasone (DECADRON) injection 10 mg (10 mg Intravenous Given 11/12/21 1557)  albuterol (PROVENTIL) (2.5 MG/3ML) 0.083% nebulizer solution 2.5 mg (2.5 mg Nebulization Given 11/12/21 1440)  ondansetron (ZOFRAN) injection 4 mg (4 mg Intravenous Given 11/12/21 1612)    ED Course/ Medical Decision Making/ A&P Clinical Course as of 11/12/21 1629  Fri Nov 12, 2021  1507 61 yo female w/ COPD, chronic smoker, here with SOB and fever, headache x 2 days.  Covid  positive.  On exam speaking in full sentences, no hypoxia, but significant expiratory wheezing.  Xray without focal infiltrate per my interpretation.  Patient is receiving treatment with nebulizers, IV magnesium, also received IV steroids.  I would like to start her on antiviral medications for COVID given her medical comorbidities with COPD at high risk for COVID complications.  We will need to reassess her after her breathing treatments to determine whether she would be safe for home management, she does have a nebulizer machine, could be treated with steroids at home, versus observation in the hospital.  We are also awaiting her basic labs to evaluate her hydration status.  I do not see clinical indication for antibiotics at this time as suspect is a viral infection.  Her EKG does not show any acute ischemic findings. [MT]    Clinical Course User Index [MT] Trifan, Carola Rhine, MD                           Medical Decision Making Amount and/or Complexity of Data Reviewed Labs: ordered. Radiology: ordered.  Risk OTC drugs. Prescription drug management.   61 y.o. female presents to the ED for concern of Cough     This involves an extensive number of treatment options, and is a complaint that carries with it a high risk of complications and morbidity.  The emergent differential diagnosis prior to evaluation includes, but is not limited to: COPD exacerbation, pneumonia, pharyngitis  This is not an exhaustive differential.   Past Medical History / Co-morbidities / Social History: Hx of advanced COPD, chronic tobacco use, depression, osteopenia, panic attacks Social Determinants of Health include: Chronic tobacco use, for cessation counseling was provided  Additional History:  None  Lab Tests: I ordered, and personally interpreted labs.  The pertinent results include:   CBC: CMP/BMP: Covid POSITIVE  Imaging Studies: I ordered imaging studies including CXR .   I independently  visualized and interpreted imaging which showed no evidence of pneumonia  I agree with the radiologist interpretation.  Cardiac Monitoring: The patient was maintained on a cardiac monitor.  I  personally viewed and interpreted the cardiac monitored which showed an underlying rhythm of: NSR  ED Course / Critical Interventions: Pt well-appearing on exam.  Presented to the ED with cough, mild shortness of breath, wheezing, and increased sputum production, concerning for exacerbation of COPD.  Upon arrival, the patient was satting well (Sp02 [id number] on RA).  Supplemental O2 not indicated at time.  Other vital signs were stable.  Patient was evaluated with pulse oximetry. Chest Xray performed (see results above).  Covid positive.  Labs reviewed. EKG: NSR with left fascicular block and ventricular rate of 82.  Patient given duoneb, prednisone '10mg'$ , and antivirals (molnupiravir).  Ventilation unnecessary at this time.  Discussed possibility of admission vs home treatment with pt.  Pt requests admission if still unwell following nebulizer treatments.  Due to significant wheezing on exam, multiple comorbidities, chronic tobacco use, and lives alone, I find this reasonable.  Anticipate possible admission.  Upon reevaluation, patient has demonstrated significant improvement.  Minimal wheezing on exam, which patient states she is back to baseline.  Pt feels much better and feels safe monitoring her symptoms from home.  Recommend continued use of DuoNeb, with additional albuterol as needed.  Also recommend short course of prednisone and antiviral.  Instructed to follow-up with closely with PCP.  Patient satisfied with today's encounter.  In NAD and good condition at time of discharge.  Disposition: Considered admission however after reviewing the patient's encounter today, I feel that the patient would be safe to monitor symptoms from home and close follow-up with PCP.  Also recommend symptomatic treatment as described  above.  Strict return precautions discussed.  Discussed course of treatment with the patient, whom demonstrated understanding.  Patient in agreement and has no further questions.    This patient was a shared case encounter with my attending, Dr. Langston Masker, who directly contributed to the proposed treatment course and cosigned this note including patient's presenting symptoms, physical exam, and planned diagnostics and interventions.  Attending physician stated agreement with plan or made changes to plan which were implemented.     This chart was dictated using voice recognition software.  Despite best efforts to proofread, errors can occur which can change the documentation meaning.         Final Clinical Impression(s) / ED Diagnoses Final diagnoses:  COPD exacerbation ([place])  COVID-19  Cigarette smoker    Rx / DC Orders ED Discharge Orders          Ordered    predniSONE (DELTASONE) 20 MG tablet  Daily        11/12/21 1622    ipratropium-albuterol (DUONEB) 0.5-2.5 (3) MG/3ML SOLN  Every 6 hours PRN       Note to Pharmacy: J44.9   11/12/21 1622    albuterol (VENTOLIN HFA) 108 (90 Base) MCG/ACT inhaler  Every 4 hours PRN        11/12/21 1622    molnupiravir EUA (LAGEVRIO) 200 mg CAPS capsule  2 times daily        11/12/21 1625              Prince Rome, Hershal Coria [birth year] 1630    Wyvonnia Dusky, MD 11/13/21 1135

## 2021-11-15 ENCOUNTER — Ambulatory Visit: Payer: Medicare HMO | Admitting: Adult Health

## 2021-11-23 ENCOUNTER — Other Ambulatory Visit: Payer: Self-pay | Admitting: Adult Health

## 2021-12-08 ENCOUNTER — Other Ambulatory Visit: Payer: Self-pay | Admitting: Internal Medicine

## 2021-12-08 ENCOUNTER — Other Ambulatory Visit: Payer: Self-pay | Admitting: Primary Care

## 2021-12-09 ENCOUNTER — Other Ambulatory Visit: Payer: Self-pay | Admitting: Adult Health

## 2021-12-09 NOTE — Telephone Encounter (Signed)
Please schedule appt

## 2021-12-13 ENCOUNTER — Encounter: Payer: Self-pay | Admitting: Adult Health

## 2021-12-13 ENCOUNTER — Ambulatory Visit (INDEPENDENT_AMBULATORY_CARE_PROVIDER_SITE_OTHER): Payer: Medicare HMO | Admitting: Adult Health

## 2021-12-13 DIAGNOSIS — F331 Major depressive disorder, recurrent, moderate: Secondary | ICD-10-CM

## 2021-12-13 DIAGNOSIS — F22 Delusional disorders: Secondary | ICD-10-CM | POA: Diagnosis not present

## 2021-12-13 DIAGNOSIS — F99 Mental disorder, not otherwise specified: Secondary | ICD-10-CM

## 2021-12-13 DIAGNOSIS — F411 Generalized anxiety disorder: Secondary | ICD-10-CM

## 2021-12-13 DIAGNOSIS — R69 Illness, unspecified: Secondary | ICD-10-CM | POA: Diagnosis not present

## 2021-12-13 DIAGNOSIS — F5105 Insomnia due to other mental disorder: Secondary | ICD-10-CM

## 2021-12-13 MED ORDER — VENLAFAXINE HCL ER 75 MG PO CP24: 75.0000 mg | ORAL_CAPSULE | Freq: Every day | ORAL | 3 refills | Status: DC

## 2021-12-13 MED ORDER — PROPRANOLOL HCL 10 MG PO TABS: 10.0000 mg | ORAL_TABLET | Freq: Three times a day (TID) | ORAL | 3 refills | Status: DC

## 2021-12-13 NOTE — Progress Notes (Signed)
Crystal Duarte [id number] [birth year] 61 y.o.  Subjective:   Patient ID:  Crystal Duarte is a 61 y.o. (DOB [birth year]) female.  Chief Complaint: No chief complaint on file.   HPI Crystal Duarte presents to the office today for follow-up of MDD, GAD, paranoia and insomnia.  Describes mood today as "not so good". Pleasant. Flat. Denies tearfulness. Mood symptoms - reports depression, anxiety, and irritability. Mood is lower. Stating "I'm not feeling well". Recently diagnosed with Covid - recovering - feels tired all the time. Reports decreased concerns regarding neighbor. Stating "I have come to the conclusion that if she is going to listen to me she just can". Working with a Transport planner. Concerned about finances - trying to minimize everything - can no longer afford the Lybalvi. Varying interest and motivation. Taking medications as prescribed. Energy levels lower. Active, does not have a regular exercise routine.  Enjoys some usual interests and activities. Single. Lives alone. Daughter - Son and 7 grandchildren. Spending time with family. Mother - 32 local - sister helping with care. Appetite adequate. Weight stable 185 pounds. Sleeps well most nights. Averages 10 hours of sleep and wakes up tired.. Focus and concentration stable. Completing tasks. Managing aspects of household. Receives SSI disability (2016).  Works part time at a senior living facility.  Denies SI or HI.  Denies AH or VH. Denies self harm. Denies substance use.  Reporting a long battle with mental illness to include multiple hospitalizations, multiple medication trials, ECT, TMS, and CBT.    [place] Office Visit from 01/21/2019 in [place] Office Visit from 01/09/2018 in [place] Visit from 07/28/2016 in [place]  Total GAD-7 Score '6 6 18      '$ PHQ2-9    [place] Visit from 10/15/2021  in [place] at [facility] Visit from 02/10/2020 in [place] at [facility] Visit from 01/21/2019 in [place] Visit from 01/09/2018 in [place] from 12/14/2016 in [place]  PHQ-2 Total Score 0 '4 1 1 1  '$ PHQ-9 Total Score '3 8 10 8 '$ --      Flowsheet Row ED from 11/12/2021 in [facility] ED from 10/15/2021 in [facility] ED from 11/02/2017 in [facility] [facility]  C-SSRS RISK CATEGORY No Risk No Risk High Risk        Review of Systems:  Review of Systems  Musculoskeletal:  Negative for gait problem.  Neurological:  Negative for tremors.  Psychiatric/Behavioral:         Please refer to HPI    Medications: I have reviewed the patient's current medications.  Current Outpatient Medications  Medication Sig Dispense Refill   albuterol (VENTOLIN HFA) 108 (90 Base) MCG/ACT inhaler Inhale 2 puffs into the lungs every 4 (four) hours as needed. 8 g 0   busPIRone (BUSPAR) 10 MG tablet Take two tablets three times daily for anxiety. 540 tablet 3   Cholecalciferol (VITAMIN D3 PO) Take by mouth.     FLUBLOK QUADRIVALENT 0.5 ML injection  (Patient not taking: Reported on 10/20/2021)     gabapentin (NEURONTIN) 400 MG capsule Take 1 capsule (400 mg total) by mouth 2 (two) times daily. 180 capsule 3   gabapentin (NEURONTIN) 600 MG tablet Take 1 tablet (600 mg total) by mouth daily. 90 tablet 3  ipratropium-albuterol (DUONEB) 0.5-2.5 (3) MG/3ML SOLN Take 3 mLs by nebulization every 6 (six) hours as needed. 360 mL 1   montelukast (SINGULAIR) 10 MG tablet TAKE ONE TABLET BY MOUTH EVERY NIGHT AT BEDTIME 90 tablet 0   Multiple Vitamin (MULTI-VITAMINS) TABS Take 1 tablet by mouth daily.      omeprazole (PRILOSEC) 20 MG capsule TAKE ONE CAPSULE BY MOUTH DAILY 90 capsule 3   propranolol  (INDERAL) 10 MG tablet Take 1 tablet (10 mg total) by mouth 3 (three) times daily. 270 tablet 3   rosuvastatin (CRESTOR) 20 MG tablet Take 1 tablet (20 mg total) by mouth daily. 90 tablet 3   traZODone (DESYREL) 100 MG tablet Take 2.5 tablets (250 mg total) by mouth at bedtime. 225 tablet 3   triamcinolone ointment (KENALOG) 0.5 % Apply 1 Application topically 2 (two) times daily. 30 g 1   venlafaxine XR (EFFEXOR-XR) 150 MG 24 hr capsule Take 1 capsule (150 mg total) by mouth daily with breakfast. 90 capsule 3   venlafaxine XR (EFFEXOR-XR) 75 MG 24 hr capsule Take 1 capsule (75 mg total) by mouth daily. 90 capsule 3   No current facility-administered medications for this visit.    Medication Side Effects: None  Allergies:  Allergies  Allergen Reactions   Other Nausea And Vomiting    novacaine    Procaine Nausea And Vomiting   Latuda [Lurasidone Hcl] Anxiety   Sulfa Antibiotics Rash    Only in sunlight     Past Medical History:  Diagnosis Date   Coccidioidomycosis, pulmonary (HCC)    COPD (chronic obstructive pulmonary disease) (HCC)    Depression    Hx of adenomatous polyp of colon 12/04/2014   Major depressive disorder    since 14, electroconvulsive therapy, and transcranial magnetic stimulation recently for 6 weeks every day    Osteopenia 01/2019   T score -2.1 FRAX 9.6% / 2.1% stable from prior DEXA   Panic attack    PONV (postoperative nausea and vomiting)    VAIN I (vaginal intraepithelial neoplasia grade I) 2015   Positive HPV negative subtype 16, 18/45    Past Medical History, Surgical history, Social history, and Family history were reviewed and updated as appropriate.   Please see review of systems for further details on the patient's review from today.   Objective:   Physical Exam:  There were no vitals taken for this visit.  Physical Exam Constitutional:      General: She is not in acute distress. Musculoskeletal:        General: No deformity.   Neurological:     Mental Status: She is alert and oriented to person, place, and time.     Coordination: Coordination normal.  Psychiatric:        Attention and Perception: Attention and perception normal. She does not perceive auditory or visual hallucinations.        Mood and Affect: Mood normal. Mood is not anxious or depressed. Affect is not labile, blunt, angry or inappropriate.        Speech: Speech normal.        Behavior: Behavior normal.        Thought Content: Thought content normal. Thought content is not paranoid or delusional. Thought content does not include homicidal or suicidal ideation. Thought content does not include homicidal or suicidal plan.        Cognition and Memory: Cognition and memory normal.        Judgment: Judgment normal.  Comments: Insight intact     Lab Review:     Component Value Date/Time   NA 139 11/12/2021 1528   NA 147 (H) 02/12/2021 1409   NA 136 05/14/2013 1829   K 4.1 11/12/2021 1528   K 3.8 05/14/2013 1829   CL 102 11/12/2021 1528   CL 98 05/14/2013 1829   CO2 26 11/12/2021 1528   CO2 35 (H) 05/14/2013 1829   GLUCOSE 98 11/12/2021 1528   GLUCOSE 104 (H) 05/14/2013 1829   BUN 15 11/12/2021 1528   BUN 10 02/12/2021 1409   BUN 7 05/14/2013 1829   CREATININE 0.72 11/12/2021 1528   CREATININE 0.83 01/27/2020 0940   CALCIUM 9.1 11/12/2021 1528   CALCIUM 9.1 05/14/2013 1829   PROT 7.0 11/12/2021 1528   PROT 5.9 (L) 02/12/2021 1409   PROT 7.2 05/14/2013 1829   ALBUMIN 4.2 11/12/2021 1528   ALBUMIN 4.2 02/12/2021 1409   ALBUMIN 3.5 05/14/2013 1829   AST 18 11/12/2021 1528   AST 16 05/14/2013 1829   ALT 16 11/12/2021 1528   ALT 36 05/14/2013 1829   ALKPHOS 60 11/12/2021 1528   ALKPHOS 83 05/14/2013 1829   BILITOT 0.5 11/12/2021 1528   BILITOT 0.3 02/12/2021 1409   BILITOT 0.5 05/14/2013 1829   GFRNONAA >60 11/12/2021 1528   GFRNONAA >60 05/14/2013 1829   GFRAA 99 03/02/2020 1553   GFRAA >60 05/14/2013 1829       Component  Value Date/Time   WBC 8.1 11/12/2021 1528   RBC 4.33 11/12/2021 1528   HGB 14.0 11/12/2021 1528   HGB 13.4 05/14/2013 1829   HCT 41.9 11/12/2021 1528   HCT 40.4 05/14/2013 1829   PLT 202 11/12/2021 1528   PLT 267 05/14/2013 1829   MCV 96.8 11/12/2021 1528   MCV 95 05/14/2013 1829   MCH 32.3 11/12/2021 1528   MCHC 33.4 11/12/2021 1528   RDW 13.8 11/12/2021 1528   RDW 13.3 05/14/2013 1829   LYMPHSABS 2.4 11/12/2021 1528   LYMPHSABS 3.6 05/14/2013 1829   MONOABS 1.1 (H) 11/12/2021 1528   MONOABS 0.6 05/14/2013 1829   EOSABS 0.1 11/12/2021 1528   EOSABS 0.2 05/14/2013 1829   BASOSABS 0.0 11/12/2021 1528   BASOSABS 0.1 05/14/2013 1829    No results found for: "POCLITH", "LITHIUM"   No results found for: "PHENYTOIN", "PHENOBARB", "VALPROATE", "CBMZ"   .res Assessment: Plan:     Trazadone '250mg'$  at bedtime Buspar '10mg'$  - '20mg'$  TID Propranolol '10mg'$  TID Effexor XR '150mg'$  daily Increase Effexor XR 37.5 to '75mg'$  daily Gabapentin '400mg'$  twice daily and '600mg'$  at bedtime  D/C Lybalvi - patient can't afford and no longer feels like she can afford medication.  Discussed potential metabolic side effects associated with atypical antipsychotics, as well as potential risk for movement side effects. Advised pt to contact office if movement side effects occur.    RTC 4 weeks  Time spent with patient was 40 minutes. Greater than 50% of face to face time with patient was spent on counseling and coordination of care.     Diagnoses and all orders for this visit:  Generalized anxiety disorder  Major depressive disorder, recurrent episode, moderate (HCC) -     propranolol (INDERAL) 10 MG tablet; Take 1 tablet (10 mg total) by mouth 3 (three) times daily. -     venlafaxine XR (EFFEXOR-XR) 75 MG 24 hr capsule; Take 1 capsule (75 mg total) by mouth daily.  Paranoia ([place])  Insomnia due to other mental disorder  Please see After Visit Summary for patient specific instructions.  No future  appointments.  No orders of the defined types were placed in this encounter.   -------------------------------

## 2021-12-16 ENCOUNTER — Ambulatory Visit (INDEPENDENT_AMBULATORY_CARE_PROVIDER_SITE_OTHER): Payer: Medicare HMO | Admitting: Family Medicine

## 2021-12-16 ENCOUNTER — Encounter: Payer: Self-pay | Admitting: Family Medicine

## 2021-12-16 ENCOUNTER — Ambulatory Visit (INDEPENDENT_AMBULATORY_CARE_PROVIDER_SITE_OTHER): Payer: Medicare HMO

## 2021-12-16 VITALS — BP 140/80 | HR 75 | Temp 97.6°F | Ht 63.5 in | Wt 195.0 lb

## 2021-12-16 DIAGNOSIS — R071 Chest pain on breathing: Secondary | ICD-10-CM

## 2021-12-16 DIAGNOSIS — R062 Wheezing: Secondary | ICD-10-CM

## 2021-12-16 DIAGNOSIS — R0602 Shortness of breath: Secondary | ICD-10-CM | POA: Diagnosis not present

## 2021-12-16 DIAGNOSIS — J449 Chronic obstructive pulmonary disease, unspecified: Secondary | ICD-10-CM | POA: Diagnosis not present

## 2021-12-16 DIAGNOSIS — R0789 Other chest pain: Secondary | ICD-10-CM

## 2021-12-16 DIAGNOSIS — R051 Acute cough: Secondary | ICD-10-CM

## 2021-12-16 DIAGNOSIS — R059 Cough, unspecified: Secondary | ICD-10-CM | POA: Diagnosis not present

## 2021-12-16 LAB — CBC WITH DIFFERENTIAL/PLATELET
Basophils Absolute: 0.1 10*3/uL (ref 0.0–0.1)
Basophils Relative: 0.7 % (ref 0.0–3.0)
Eosinophils Absolute: 0.1 10*3/uL (ref 0.0–0.7)
Eosinophils Relative: 1.1 % (ref 0.0–5.0)
HCT: 41.2 % (ref 36.0–46.0)
Hemoglobin: 13.5 g/dL (ref 12.0–15.0)
Lymphocytes Relative: 34.6 % (ref 12.0–46.0)
Lymphs Abs: 4 10*3/uL (ref 0.7–4.0)
MCHC: 32.8 g/dL (ref 30.0–36.0)
MCV: 97 fl (ref 78.0–100.0)
Monocytes Absolute: 0.6 10*3/uL (ref 0.1–1.0)
Monocytes Relative: 5.2 % (ref 3.0–12.0)
Neutro Abs: 6.7 10*3/uL (ref 1.4–7.7)
Neutrophils Relative %: 58.4 % (ref 43.0–77.0)
Platelets: 281 10*3/uL (ref 150.0–400.0)
RBC: 4.25 Mil/uL (ref 3.87–5.11)
RDW: 14.5 % (ref 11.5–15.5)
WBC: 11.5 10*3/uL — ABNORMAL HIGH (ref 4.0–10.5)

## 2021-12-16 LAB — BASIC METABOLIC PANEL
BUN: 7 mg/dL (ref 6–23)
CO2: 32 mEq/L (ref 19–32)
Calcium: 9.8 mg/dL (ref 8.4–10.5)
Chloride: 100 mEq/L (ref 96–112)
Creatinine, Ser: 0.82 mg/dL (ref 0.40–1.20)
GFR: 76.64 mL/min (ref >60.00)
Glucose, Bld: 96 mg/dL (ref 70–99)
Potassium: 4.3 mEq/L (ref 3.5–5.1)
Sodium: 140 mEq/L (ref 135–145)

## 2021-12-16 MED ORDER — METHYLPREDNISOLONE ACETATE 80 MG/ML IJ SUSP
80.0000 mg | Freq: Once | INTRAMUSCULAR | Status: AC
  Administered 2021-12-16: 80 mg via INTRAMUSCULAR

## 2021-12-16 MED ORDER — AZITHROMYCIN 250 MG PO TABS: ORAL_TABLET | ORAL | 0 refills | Status: AC

## 2021-12-16 NOTE — Patient Instructions (Signed)
You received Depo-Medrol 80 mg IM injection in the office.  Start taking azithromycin today.  Please go downstairs for labs and a chest x-ray.  Please call and schedule a follow-up visit with your pulmonologist, Dr. Annamaria Boots, as you are overdue for this per your medical record.  If you have any worsening chest pain, shortness of breath or any new concerning symptoms then call 911 or go to the emergency department.

## 2021-12-16 NOTE — Progress Notes (Unsigned)
Subjective:     Patient ID: Crystal Duarte, female    DOB: 20-Feb-1961, 61 y.o.   MRN: [id number]  Chief Complaint  Patient presents with   Fatigue   Generalized Body Aches    Since covid 8/11, mentions she does have COPD and when she had covid it was very bad   Chest Pain    Everything she breaths in she feels a right lung pain and it will make her cough   Wheezing    HPI Patient is in today for right sided chest pain with inspiration, cough, chest congestion, wheezing, and fatigue since having Covid one month ago.  States she feels like she has Covid again.  Rhinorrhea.   She has advanced COPD, multiple pulmonary nodules and tobacco use.   Using nebulizer 3 times per day, albuterol 1-2 times per day.   Patient of Dr. Annamaria Boots, pulmonologist.   No fever, chills  There are no preventive care reminders to display for this patient.   Past Medical History:  Diagnosis Date   Coccidioidomycosis, pulmonary (HCC)    COPD (chronic obstructive pulmonary disease) (HCC)    Depression    Hx of adenomatous polyp of colon 12/04/2014   Major depressive disorder    since 14, electroconvulsive therapy, and transcranial magnetic stimulation recently for 6 weeks every day    Osteopenia 01/2019   T score -2.1 FRAX 9.6% / 2.1% stable from prior DEXA   Panic attack    PONV (postoperative nausea and vomiting)    VAIN I (vaginal intraepithelial neoplasia grade I) 2015   Positive HPV negative subtype 16, 18/45    Past Surgical History:  Procedure Laterality Date   ABDOMINAL HYSTERECTOMY     TAH BSO   BILATERAL VATS ABLATION     vats for biopsy due to infection   BREAST EXCISIONAL BIOPSY Right    benign   CATARACT EXTRACTION     COLONOSCOPY  2001   hemorrhoidectomy   HEMORRHOID SURGERY     LUNG SURGERY  2001   VATS surgery    OVARIAN CYST REMOVAL  1970's   ULNAR NERVE TRANSPOSITION Left 12/06/2018   Procedure: LEFT ULNAR NERVE DECOMPRESSION,  ;  Surgeon: Leanora Cover, MD;   Location: [facility];  Service: Orthopedics;  Laterality: Left;   VESICOVAGINAL FISTULA CLOSURE W/ TAH  2005   WRIST ARTHROSCOPY Left 03/13/2018   Procedure: ARTHROSCOPY LEFT WRIST WITH DEBRIDEMENT;  Surgeon: Leanora Cover, MD;  Location: [facility];  Service: Orthopedics;  Laterality: Left;  block    Family History  Problem Relation Age of Onset   Colon polyps Mother    Asthma Mother    Ovarian cancer Mother    Varicose Veins Mother    Colon polyps Father    Heart disease Father    Peripheral vascular disease Father    Lung cancer Maternal Grandmother    Colon cancer Neg Hx    Esophageal cancer Neg Hx    Stomach cancer Neg Hx    Pancreatic cancer Neg Hx    Liver disease Neg Hx     Social History   Socioeconomic History   Marital status: Divorced    Spouse name: Not on file   Number of children: 2   Years of education: Not on file   Highest education level: Not on file  Occupational History   Occupation: Herbalist    Comment: disability    Employer: [employer]  Tobacco Use  Smoking status: Every Day    Packs/day: 1.00    Years: 42.00    Total pack years: 42.00    Types: Cigarettes   Smokeless tobacco: Never   Tobacco comments:    1/2 pack a day [facility] 03/25/21  Vaping Use   Vaping Use: Never used  Substance and Sexual Activity   Alcohol use: No    Alcohol/week: 0.0 standard drinks of alcohol   Drug use: No   Sexual activity: Not Currently    Birth control/protection: Surgical    Comment: 1st intercourse 61 yo-More than 5 partners  Other Topics Concern   Not on file  Social History Narrative   Lives with son, daughter in Sports coach and 4 grandchildren    Social Determinants of Health   Financial Resource Strain: Not on file  Food Insecurity: Not on file  Transportation Needs: Not on file  Physical Activity: Not on file  Stress: Not on file  Social Connections: Not on file  Intimate Partner Violence: Not on file     Outpatient Medications Prior to Visit  Medication Sig Dispense Refill   albuterol (VENTOLIN HFA) 108 (90 Base) MCG/ACT inhaler Inhale 2 puffs into the lungs every 4 (four) hours as needed. 8 g 0   busPIRone (BUSPAR) 10 MG tablet Take two tablets three times daily for anxiety. 540 tablet 3   Cholecalciferol (VITAMIN D3 PO) Take by mouth.     gabapentin (NEURONTIN) 400 MG capsule Take 1 capsule (400 mg total) by mouth 2 (two) times daily. 180 capsule 3   gabapentin (NEURONTIN) 600 MG tablet Take 1 tablet (600 mg total) by mouth daily. 90 tablet 3   ipratropium-albuterol (DUONEB) 0.5-2.5 (3) MG/3ML SOLN Take 3 mLs by nebulization every 6 (six) hours as needed. 360 mL 1   montelukast (SINGULAIR) 10 MG tablet TAKE ONE TABLET BY MOUTH EVERY NIGHT AT BEDTIME 90 tablet 0   Multiple Vitamin (MULTI-VITAMINS) TABS Take 1 tablet by mouth daily.      omeprazole (PRILOSEC) 20 MG capsule TAKE ONE CAPSULE BY MOUTH DAILY 90 capsule 3   propranolol (INDERAL) 10 MG tablet Take 1 tablet (10 mg total) by mouth 3 (three) times daily. 270 tablet 3   rosuvastatin (CRESTOR) 20 MG tablet Take 1 tablet (20 mg total) by mouth daily. 90 tablet 3   traZODone (DESYREL) 100 MG tablet Take 2.5 tablets (250 mg total) by mouth at bedtime. 225 tablet 3   triamcinolone ointment (KENALOG) 0.5 % Apply 1 Application topically 2 (two) times daily. 30 g 1   venlafaxine XR (EFFEXOR-XR) 150 MG 24 hr capsule Take 1 capsule (150 mg total) by mouth daily with breakfast. 90 capsule 3   venlafaxine XR (EFFEXOR-XR) 75 MG 24 hr capsule Take 1 capsule (75 mg total) by mouth daily. 90 capsule 3   FLUBLOK QUADRIVALENT 0.5 ML injection      No facility-administered medications prior to visit.    Allergies  Allergen Reactions   Other Nausea And Vomiting    novacaine    Procaine Nausea And Vomiting   Latuda [Lurasidone Hcl] Anxiety   Sulfa Antibiotics Rash    Only in sunlight     ROS     Objective:    Physical  Exam Constitutional:      General: She is not in acute distress.    Appearance: She is ill-appearing.  Cardiovascular:     Rate and Rhythm: Normal rate and regular rhythm.  Pulmonary:     Effort: Pulmonary effort is  normal.     Breath sounds: Wheezing and rhonchi present.  Chest:     Chest wall: Tenderness present.  Musculoskeletal:     Right lower leg: No edema.     Left lower leg: No edema.  Skin:    General: Skin is warm and dry.  Neurological:     General: No focal deficit present.     Mental Status: She is alert and oriented to person, place, and time.  Psychiatric:        Mood and Affect: Mood normal.        Behavior: Behavior normal.     BP (!) 140/80 (BP Location: Left Arm, Patient Position: Sitting, Cuff Size: Large)   Pulse 75   Temp 97.6 F (36.4 C) (Temporal)   Ht 5' 3.5" (1.613 m)   Wt 195 lb (88.5 kg)   SpO2 98%   BMI 34.00 kg/m  Wt Readings from Last 3 Encounters:  12/16/21 195 lb (88.5 kg)  11/12/21 194 lb 0.1 oz (88 kg)  10/15/21 194 lb (88 kg)       Assessment & Plan:   Problem List Items Addressed This Visit       Respiratory   COPD  GOLD 2/ still smoking    Relevant Medications   azithromycin (ZITHROMAX) 250 MG tablet     Other   Chest pain on breathing   Relevant Orders   DG Chest 2 View (Completed)   CBC with Differential/Platelet (Completed)   Basic metabolic panel (Completed)   Chest wall tenderness   Cough   Relevant Medications   azithromycin (ZITHROMAX) 250 MG tablet   Other Relevant Orders   DG Chest 2 View (Completed)   CBC with Differential/Platelet (Completed)   Basic metabolic panel (Completed)   Wheezing - Primary   Relevant Medications   azithromycin (ZITHROMAX) 250 MG tablet   Other Relevant Orders   DG Chest 2 View (Completed)   CBC with Differential/Platelet (Completed)   Basic metabolic panel (Completed)   Continue current COPD medications.  No acute distress.  Depo-Medrol 80 mg IM injection given.   Z-pak prescribed.  Stat CXR and labs ordered.  Follow up if worsening and pending labs.  She will call and schedule overdue f/u with pulmonologist.   I have discontinued Cristopher Peru. Ammons's Flublok Quadrivalent. I am also having her start on azithromycin. Additionally, I am having her maintain her Multi-Vitamins, rosuvastatin, busPIRone, venlafaxine XR, traZODone, gabapentin, gabapentin, Cholecalciferol (VITAMIN D3 PO), triamcinolone ointment, ipratropium-albuterol, albuterol, omeprazole, montelukast, propranolol, and venlafaxine XR. We administered methylPREDNISolone acetate.  Meds ordered this encounter  Medications   methylPREDNISolone acetate (DEPO-MEDROL) injection 80 mg   azithromycin (ZITHROMAX) 250 MG tablet    Sig: Take 2 tablets on day 1, then 1 tablet daily on days 2 through 5    Dispense:  6 tablet    Refill:  0    Order Specific Question:   Supervising Provider    Answer:   Pricilla Holm A [[id number]]

## 2021-12-17 ENCOUNTER — Ambulatory Visit: Payer: Medicare HMO | Admitting: Internal Medicine

## 2021-12-19 DIAGNOSIS — R0789 Other chest pain: Secondary | ICD-10-CM | POA: Insufficient documentation

## 2021-12-21 ENCOUNTER — Ambulatory Visit: Payer: Self-pay | Admitting: Licensed Clinical Social Worker

## 2021-12-21 NOTE — Patient Instructions (Signed)
    It was a pleasure speaking with you today. Per your request I will call you back during the early morning    Care Coordination team works in collaboration with your primary care doctor.  Please call [phone] if you would like to schedule a phone appointment with a Nurse or Social work Care Coordinator to assist with navigating your physical and mental health needs.     Casimer Lanius, [place] [phone]

## 2021-12-21 NOTE — Patient Outreach (Signed)
  Care Coordination   Initial Visit Note   12/21/2021 Name: Crystal Duarte MRN: [id number] DOB: 06/16/1960  Crystal Duarte is a 61 y.o. year old female who sees [name], MD for primary care. I spoke with  Alesia Morin Ferreras by phone today.  What matters to the patients health and wellness today?      Goals Addressed             This Visit's Progress    Care Coordination Activities       Care Coordination Interventions: Reviewed Care Coordination Services:Requested a call back         SDOH assessments and interventions completed:  No     Care Coordination Interventions Activated:  Yes  Care Coordination Interventions:  Yes, provided   Follow up plan:  Will call back in 1 to 7 days.    Encounter Outcome:  Pt. Visit Completed   Casimer Lanius, [place] [phone]

## 2021-12-22 ENCOUNTER — Ambulatory Visit: Payer: Medicare HMO | Admitting: Family Medicine

## 2021-12-23 ENCOUNTER — Telehealth (HOSPITAL_COMMUNITY): Payer: Self-pay | Admitting: Psychiatry

## 2021-12-23 NOTE — Telephone Encounter (Signed)
D:  Pt phoned requesting [place] maintenance.  Reports she had [place] 04-29-21 thru 06-04-21.  "I have some depressive sx's returning."  A:  Informed pt that the interim coordinator will inquire with [employer] team to see if she can get it this soon or not.  R:  Pt receptive.

## 2021-12-24 ENCOUNTER — Telehealth (HOSPITAL_COMMUNITY): Payer: Self-pay | Admitting: Psychiatry

## 2021-12-28 ENCOUNTER — Telehealth (HOSPITAL_COMMUNITY): Payer: Self-pay | Admitting: Psychiatry

## 2021-12-29 ENCOUNTER — Telehealth (HOSPITAL_COMMUNITY): Payer: Self-pay | Admitting: Psychiatry

## 2021-12-29 NOTE — Telephone Encounter (Signed)
D:  Placed call to ask pt the preliminary [place] questions and do the PHQ-9 with her.  Pt qualifies for [place].  She scored 21 on the PHQ-9.  Reports onset of depressive sx's started 2 1/2 weeks ago.  Received [place] most recently in Feb. 2023.  Pt is requesting maintenance [place].  A:  Consult is scheduled 01-17-22 @ 9 a.m.Marland Kitchen  Provided pt with the address.  Requested that she arrives at 8:40 a.m to complete paperwork and to bring her insurance card.  Inform [place] team.  R:  Pt receptive.

## 2022-01-01 ENCOUNTER — Inpatient Hospital Stay (HOSPITAL_BASED_OUTPATIENT_CLINIC_OR_DEPARTMENT_OTHER)
Admission: EM | Admit: 2022-01-01 | Discharge: 2022-01-15 | DRG: 177 | Disposition: A | Discharge status: Home Health | Payer: Medicare HMO | Attending: Internal Medicine | Admitting: Internal Medicine

## 2022-01-01 ENCOUNTER — Other Ambulatory Visit: Payer: Self-pay

## 2022-01-01 ENCOUNTER — Encounter (HOSPITAL_COMMUNITY): Payer: Self-pay

## 2022-01-01 ENCOUNTER — Emergency Department (HOSPITAL_BASED_OUTPATIENT_CLINIC_OR_DEPARTMENT_OTHER): Payer: Medicare HMO | Admitting: Radiology

## 2022-01-01 ENCOUNTER — Inpatient Hospital Stay (HOSPITAL_COMMUNITY): Payer: Medicare HMO

## 2022-01-01 ENCOUNTER — Encounter (HOSPITAL_BASED_OUTPATIENT_CLINIC_OR_DEPARTMENT_OTHER): Payer: Self-pay | Admitting: Emergency Medicine

## 2022-01-01 DIAGNOSIS — Z8249 Family history of ischemic heart disease and other diseases of the circulatory system: Secondary | ICD-10-CM | POA: Diagnosis not present

## 2022-01-01 DIAGNOSIS — I5032 Chronic diastolic (congestive) heart failure: Secondary | ICD-10-CM | POA: Diagnosis present

## 2022-01-01 DIAGNOSIS — Z9079 Acquired absence of other genital organ(s): Secondary | ICD-10-CM

## 2022-01-01 DIAGNOSIS — Z90722 Acquired absence of ovaries, bilateral: Secondary | ICD-10-CM

## 2022-01-01 DIAGNOSIS — Z8719 Personal history of other diseases of the digestive system: Secondary | ICD-10-CM

## 2022-01-01 DIAGNOSIS — E785 Hyperlipidemia, unspecified: Secondary | ICD-10-CM | POA: Diagnosis present

## 2022-01-01 DIAGNOSIS — Z888 Allergy status to other drugs, medicaments and biological substances status: Secondary | ICD-10-CM

## 2022-01-01 DIAGNOSIS — I509 Heart failure, unspecified: Secondary | ICD-10-CM | POA: Diagnosis not present

## 2022-01-01 DIAGNOSIS — F17210 Nicotine dependence, cigarettes, uncomplicated: Secondary | ICD-10-CM | POA: Diagnosis present

## 2022-01-01 DIAGNOSIS — I5031 Acute diastolic (congestive) heart failure: Secondary | ICD-10-CM | POA: Diagnosis present

## 2022-01-01 DIAGNOSIS — R69 Illness, unspecified: Secondary | ICD-10-CM | POA: Diagnosis not present

## 2022-01-01 DIAGNOSIS — J1282 Pneumonia due to coronavirus disease 2019: Secondary | ICD-10-CM | POA: Diagnosis not present

## 2022-01-01 DIAGNOSIS — F411 Generalized anxiety disorder: Secondary | ICD-10-CM | POA: Diagnosis present

## 2022-01-01 DIAGNOSIS — J9601 Acute respiratory failure with hypoxia: Secondary | ICD-10-CM | POA: Diagnosis not present

## 2022-01-01 DIAGNOSIS — I959 Hypotension, unspecified: Secondary | ICD-10-CM | POA: Diagnosis not present

## 2022-01-01 DIAGNOSIS — F310 Bipolar disorder, current episode hypomanic: Secondary | ICD-10-CM | POA: Diagnosis not present

## 2022-01-01 DIAGNOSIS — U071 COVID-19: Principal | ICD-10-CM

## 2022-01-01 DIAGNOSIS — Z66 Do not resuscitate: Secondary | ICD-10-CM | POA: Diagnosis not present

## 2022-01-01 DIAGNOSIS — Z6834 Body mass index (BMI) 34.0-34.9, adult: Secondary | ICD-10-CM | POA: Diagnosis not present

## 2022-01-01 DIAGNOSIS — E669 Obesity, unspecified: Secondary | ICD-10-CM | POA: Diagnosis present

## 2022-01-01 DIAGNOSIS — I739 Peripheral vascular disease, unspecified: Secondary | ICD-10-CM | POA: Diagnosis not present

## 2022-01-01 DIAGNOSIS — B9789 Other viral agents as the cause of diseases classified elsewhere: Secondary | ICD-10-CM | POA: Diagnosis present

## 2022-01-01 DIAGNOSIS — Z825 Family history of asthma and other chronic lower respiratory diseases: Secondary | ICD-10-CM

## 2022-01-01 DIAGNOSIS — E876 Hypokalemia: Secondary | ICD-10-CM | POA: Diagnosis present

## 2022-01-01 DIAGNOSIS — Z8616 Personal history of COVID-19: Secondary | ICD-10-CM | POA: Diagnosis not present

## 2022-01-01 DIAGNOSIS — Z23 Encounter for immunization: Secondary | ICD-10-CM | POA: Diagnosis not present

## 2022-01-01 DIAGNOSIS — Z90710 Acquired absence of both cervix and uterus: Secondary | ICD-10-CM

## 2022-01-01 DIAGNOSIS — F319 Bipolar disorder, unspecified: Secondary | ICD-10-CM | POA: Diagnosis not present

## 2022-01-01 DIAGNOSIS — R0602 Shortness of breath: Secondary | ICD-10-CM | POA: Diagnosis present

## 2022-01-01 DIAGNOSIS — F332 Major depressive disorder, recurrent severe without psychotic features: Secondary | ICD-10-CM | POA: Diagnosis present

## 2022-01-01 DIAGNOSIS — J206 Acute bronchitis due to rhinovirus: Secondary | ICD-10-CM | POA: Diagnosis present

## 2022-01-01 DIAGNOSIS — M7989 Other specified soft tissue disorders: Secondary | ICD-10-CM | POA: Diagnosis not present

## 2022-01-01 DIAGNOSIS — J440 Chronic obstructive pulmonary disease with acute lower respiratory infection: Secondary | ICD-10-CM | POA: Diagnosis present

## 2022-01-01 DIAGNOSIS — K760 Fatty (change of) liver, not elsewhere classified: Secondary | ICD-10-CM | POA: Diagnosis not present

## 2022-01-01 DIAGNOSIS — R079 Chest pain, unspecified: Secondary | ICD-10-CM | POA: Diagnosis not present

## 2022-01-01 DIAGNOSIS — Z882 Allergy status to sulfonamides status: Secondary | ICD-10-CM

## 2022-01-01 DIAGNOSIS — R739 Hyperglycemia, unspecified: Secondary | ICD-10-CM | POA: Diagnosis not present

## 2022-01-01 DIAGNOSIS — Z79891 Long term (current) use of opiate analgesic: Secondary | ICD-10-CM

## 2022-01-01 DIAGNOSIS — Z79899 Other long term (current) drug therapy: Secondary | ICD-10-CM

## 2022-01-01 DIAGNOSIS — I110 Hypertensive heart disease with heart failure: Secondary | ICD-10-CM | POA: Diagnosis present

## 2022-01-01 DIAGNOSIS — J441 Chronic obstructive pulmonary disease with (acute) exacerbation: Secondary | ICD-10-CM | POA: Diagnosis not present

## 2022-01-01 DIAGNOSIS — T380X5A Adverse effect of glucocorticoids and synthetic analogues, initial encounter: Secondary | ICD-10-CM | POA: Diagnosis not present

## 2022-01-01 DIAGNOSIS — J189 Pneumonia, unspecified organism: Secondary | ICD-10-CM | POA: Diagnosis not present

## 2022-01-01 LAB — RESP PANEL BY RT-PCR (FLU A&B, COVID) ARPGX2
Influenza A by PCR: NEGATIVE
Influenza B by PCR: NEGATIVE
SARS Coronavirus 2 by RT PCR: POSITIVE — AB

## 2022-01-01 LAB — BLOOD GAS, VENOUS
Acid-Base Excess: 2.8 mmol/L — ABNORMAL HIGH (ref 0.0–2.0)
Bicarbonate: 29.4 mmol/L — ABNORMAL HIGH (ref 20.0–28.0)
Drawn by: 6302
O2 Saturation: 68.2 %
Patient temperature: 37.3
pCO2, Ven: 53 mmHg (ref 44–60)
pH, Ven: 7.36 (ref 7.25–7.43)
pO2, Ven: 41 mmHg (ref 32–45)

## 2022-01-01 LAB — BASIC METABOLIC PANEL
Anion gap: 15 (ref 5–15)
BUN: 8 mg/dL (ref 8–23)
CO2: 22 mmol/L (ref 22–32)
Calcium: 9.2 mg/dL (ref 8.9–10.3)
Chloride: 103 mmol/L (ref 98–111)
Creatinine, Ser: 0.81 mg/dL (ref 0.44–1.00)
GFR, Estimated: >60 mL/min (ref >60)
Glucose, Bld: 148 mg/dL — ABNORMAL HIGH (ref 70–99)
Potassium: 4.5 mmol/L (ref 3.5–5.1)
Sodium: 140 mmol/L (ref 135–145)

## 2022-01-01 LAB — TROPONIN I (HIGH SENSITIVITY)
Troponin I (High Sensitivity): 4 ng/L (ref <18)
Troponin I (High Sensitivity): 3 ng/L (ref <18)
Troponin I (High Sensitivity): 3 ng/L (ref <18)
Troponin I (High Sensitivity): 3 ng/L (ref <18)

## 2022-01-01 LAB — CBC WITH DIFFERENTIAL/PLATELET
Abs Immature Granulocytes: 0.03 10*3/uL (ref 0.00–0.07)
Abs Immature Granulocytes: 0.02 10*3/uL (ref 0.00–0.07)
Basophils Absolute: 0 10*3/uL (ref 0.0–0.1)
Basophils Absolute: 0 10*3/uL (ref 0.0–0.1)
Basophils Relative: 0 %
Basophils Relative: 0 %
Eosinophils Absolute: 0.2 10*3/uL (ref 0.0–0.5)
Eosinophils Absolute: 0 10*3/uL (ref 0.0–0.5)
Eosinophils Relative: 2 %
Eosinophils Relative: 0 %
HCT: 40.9 % (ref 36.0–46.0)
HCT: 38.7 % (ref 36.0–46.0)
Hemoglobin: 13.6 g/dL (ref 12.0–15.0)
Hemoglobin: 12.6 g/dL (ref 12.0–15.0)
Immature Granulocytes: 0 %
Immature Granulocytes: 0 %
Lymphocytes Relative: 13 %
Lymphocytes Relative: 10 %
Lymphs Abs: 1.4 10*3/uL (ref 0.7–4.0)
Lymphs Abs: 0.7 10*3/uL (ref 0.7–4.0)
MCH: 32.2 pg (ref 26.0–34.0)
MCH: 32.1 pg (ref 26.0–34.0)
MCHC: 33.3 g/dL (ref 30.0–36.0)
MCHC: 32.6 g/dL (ref 30.0–36.0)
MCV: 96.9 fL (ref 80.0–100.0)
MCV: 98.5 fL (ref 80.0–100.0)
Monocytes Absolute: 0.7 10*3/uL (ref 0.1–1.0)
Monocytes Absolute: 0.1 10*3/uL (ref 0.1–1.0)
Monocytes Relative: 7 %
Monocytes Relative: 1 %
Neutro Abs: 8.5 10*3/uL — ABNORMAL HIGH (ref 1.7–7.7)
Neutro Abs: 6.9 10*3/uL (ref 1.7–7.7)
Neutrophils Relative %: 78 %
Neutrophils Relative %: 89 %
Platelets: 212 10*3/uL (ref 150–400)
Platelets: 209 10*3/uL (ref 150–400)
RBC: 4.22 MIL/uL (ref 3.87–5.11)
RBC: 3.93 MIL/uL (ref 3.87–5.11)
RDW: 13.2 % (ref 11.5–15.5)
RDW: 13 % (ref 11.5–15.5)
WBC: 10.9 10*3/uL — ABNORMAL HIGH (ref 4.0–10.5)
WBC: 7.8 10*3/uL (ref 4.0–10.5)
nRBC: 0 % (ref 0.0–0.2)
nRBC: 0 % (ref 0.0–0.2)

## 2022-01-01 LAB — COMPREHENSIVE METABOLIC PANEL
ALT: 16 U/L (ref 0–44)
AST: 18 U/L (ref 15–41)
Albumin: 3.6 g/dL (ref 3.5–5.0)
Alkaline Phosphatase: 56 U/L (ref 38–126)
Anion gap: 9 (ref 5–15)
BUN: 11 mg/dL (ref 8–23)
CO2: 24 mmol/L (ref 22–32)
Calcium: 8.5 mg/dL — ABNORMAL LOW (ref 8.9–10.3)
Chloride: 104 mmol/L (ref 98–111)
Creatinine, Ser: 0.66 mg/dL (ref 0.44–1.00)
GFR, Estimated: >60 mL/min (ref >60)
Glucose, Bld: 177 mg/dL — ABNORMAL HIGH (ref 70–99)
Potassium: 4 mmol/L (ref 3.5–5.1)
Sodium: 137 mmol/L (ref 135–145)
Total Bilirubin: 0.6 mg/dL (ref 0.3–1.2)
Total Protein: 6.5 g/dL (ref 6.5–8.1)

## 2022-01-01 LAB — CBC
HCT: 41.8 % (ref 36.0–46.0)
Hemoglobin: 13.7 g/dL (ref 12.0–15.0)
MCH: 31.9 pg (ref 26.0–34.0)
MCHC: 32.8 g/dL (ref 30.0–36.0)
MCV: 97.2 fL (ref 80.0–100.0)
Platelets: 181 10*3/uL (ref 150–400)
RBC: 4.3 MIL/uL (ref 3.87–5.11)
RDW: 13.2 % (ref 11.5–15.5)
WBC: 9.2 10*3/uL (ref 4.0–10.5)
nRBC: 0 % (ref 0.0–0.2)

## 2022-01-01 LAB — RESPIRATORY PANEL BY PCR

## 2022-01-01 LAB — I-STAT VENOUS BLOOD GAS, ED
Acid-Base Excess: 4 mmol/L — ABNORMAL HIGH (ref 0.0–2.0)
Bicarbonate: 31.4 mmol/L — ABNORMAL HIGH (ref 20.0–28.0)
Calcium, Ion: 1.24 mmol/L (ref 1.15–1.40)
HCT: 41 % (ref 36.0–46.0)
Hemoglobin: 13.9 g/dL (ref 12.0–15.0)
O2 Saturation: 45 %
Patient temperature: 98.8
Potassium: 4.3 mmol/L (ref 3.5–5.1)
Sodium: 138 mmol/L (ref 135–145)
TCO2: 33 mmol/L — ABNORMAL HIGH (ref 22–32)
pCO2, Ven: 58.2 mmHg (ref 44–60)
pH, Ven: 7.341 (ref 7.25–7.43)
pO2, Ven: 27 mmHg — CL (ref 32–45)

## 2022-01-01 LAB — FERRITIN: Ferritin: 141 ng/mL (ref 11–307)

## 2022-01-01 LAB — LACTATE DEHYDROGENASE
LDH: 184 U/L (ref 98–192)
LDH: 145 U/L (ref 98–192)

## 2022-01-01 LAB — D-DIMER, QUANTITATIVE
D-Dimer, Quant: 0.78 ug/mL-FEU — ABNORMAL HIGH (ref 0.00–0.50)
D-Dimer, Quant: 0.49 ug/mL-FEU (ref 0.00–0.50)

## 2022-01-01 LAB — MAGNESIUM: Magnesium: 2.2 mg/dL (ref 1.7–2.4)

## 2022-01-01 LAB — MRSA NEXT GEN BY PCR, NASAL: MRSA by PCR Next Gen: NOT DETECTED

## 2022-01-01 LAB — PHOSPHORUS: Phosphorus: 3.9 mg/dL (ref 2.5–4.6)

## 2022-01-01 LAB — HIV ANTIBODY (ROUTINE TESTING W REFLEX): HIV Screen 4th Generation wRfx: NONREACTIVE

## 2022-01-01 LAB — CREATININE, SERUM
Creatinine, Ser: 0.77 mg/dL (ref 0.44–1.00)
GFR, Estimated: >60 mL/min (ref >60)

## 2022-01-01 LAB — C-REACTIVE PROTEIN: CRP: 8.3 mg/dL — ABNORMAL HIGH (ref <1.0)

## 2022-01-01 LAB — HEPATITIS B SURFACE ANTIGEN: Hepatitis B Surface Ag: NONREACTIVE

## 2022-01-01 LAB — PROCALCITONIN: Procalcitonin: <0.1 ng/mL

## 2022-01-01 MED ORDER — IPRATROPIUM-ALBUTEROL 0.5-2.5 (3) MG/3ML IN SOLN
RESPIRATORY_TRACT | Status: AC
  Administered 2022-01-01: 3 mL via RESPIRATORY_TRACT
  Filled 2022-01-01: qty 3

## 2022-01-01 MED ORDER — ALBUTEROL SULFATE (2.5 MG/3ML) 0.083% IN NEBU
2.5000 mg | INHALATION_SOLUTION | Freq: Three times a day (TID) | RESPIRATORY_TRACT | Status: DC | PRN
  Administered 2022-01-01 – 2022-01-05 (×2): 2.5 mg via RESPIRATORY_TRACT
  Filled 2022-01-01 (×2): qty 3

## 2022-01-01 MED ORDER — ENOXAPARIN SODIUM 40 MG/0.4ML IJ SOSY
40.0000 mg | PREFILLED_SYRINGE | INTRAMUSCULAR | Status: DC
  Administered 2022-01-01 – 2022-01-07 (×7): 40 mg via SUBCUTANEOUS
  Filled 2022-01-01 (×7): qty 0.4

## 2022-01-01 MED ORDER — GABAPENTIN 300 MG PO CAPS
600.0000 mg | ORAL_CAPSULE | Freq: Every day | ORAL | Status: DC
  Administered 2022-01-01 – 2022-01-14 (×14): 600 mg via ORAL
  Filled 2022-01-01 (×14): qty 2

## 2022-01-01 MED ORDER — HYDROCORTISONE 1 % EX CREA
TOPICAL_CREAM | Freq: Four times a day (QID) | CUTANEOUS | Status: DC | PRN
  Filled 2022-01-01: qty 28

## 2022-01-01 MED ORDER — ORAL CARE MOUTH RINSE: 15.0000 mL | OROMUCOSAL | Status: DC | PRN

## 2022-01-01 MED ORDER — SODIUM CHLORIDE 0.9 % IV SOLN
100.0000 mg | Freq: Every day | INTRAVENOUS | Status: AC
  Administered 2022-01-02 – 2022-01-03 (×2): 100 mg via INTRAVENOUS
  Filled 2022-01-01 (×2): qty 20

## 2022-01-01 MED ORDER — ALBUTEROL SULFATE HFA 108 (90 BASE) MCG/ACT IN AERS
2.0000 | INHALATION_SPRAY | RESPIRATORY_TRACT | Status: DC | PRN
  Administered 2022-01-01: 2 via RESPIRATORY_TRACT
  Filled 2022-01-01: qty 6.7

## 2022-01-01 MED ORDER — VENLAFAXINE HCL ER 150 MG PO CP24
150.0000 mg | ORAL_CAPSULE | Freq: Every day | ORAL | Status: DC
  Administered 2022-01-02 – 2022-01-15 (×14): 150 mg via ORAL
  Filled 2022-01-01 (×14): qty 1

## 2022-01-01 MED ORDER — BUSPIRONE HCL 10 MG PO TABS
20.0000 mg | ORAL_TABLET | Freq: Three times a day (TID) | ORAL | Status: DC
  Administered 2022-01-01 – 2022-01-06 (×14): 20 mg via ORAL
  Filled 2022-01-01: qty 4
  Filled 2022-01-01 (×4): qty 2
  Filled 2022-01-01: qty 4
  Filled 2022-01-01 (×8): qty 2

## 2022-01-01 MED ORDER — MAGNESIUM SULFATE 2 GM/50ML IV SOLN
2.0000 g | Freq: Once | INTRAVENOUS | Status: AC
  Administered 2022-01-01: 2 g via INTRAVENOUS
  Filled 2022-01-01: qty 50

## 2022-01-01 MED ORDER — IPRATROPIUM-ALBUTEROL 0.5-2.5 (3) MG/3ML IN SOLN: 3.0000 mL | Freq: Once | RESPIRATORY_TRACT | Status: AC

## 2022-01-01 MED ORDER — SODIUM CHLORIDE 0.9 % IV SOLN
200.0000 mg | Freq: Once | INTRAVENOUS | Status: AC
  Administered 2022-01-01: 200 mg via INTRAVENOUS
  Filled 2022-01-01: qty 40

## 2022-01-01 MED ORDER — METHYLPREDNISOLONE SODIUM SUCC 125 MG IJ SOLR
60.0000 mg | Freq: Every day | INTRAMUSCULAR | Status: DC
  Administered 2022-01-02 – 2022-01-08 (×7): 60 mg via INTRAVENOUS
  Filled 2022-01-01 (×8): qty 2

## 2022-01-01 MED ORDER — PANTOPRAZOLE SODIUM 40 MG PO TBEC
40.0000 mg | DELAYED_RELEASE_TABLET | Freq: Every day | ORAL | Status: DC
  Administered 2022-01-01 – 2022-01-15 (×15): 40 mg via ORAL
  Filled 2022-01-01 (×15): qty 1

## 2022-01-01 MED ORDER — TRAZODONE HCL 50 MG PO TABS
250.0000 mg | ORAL_TABLET | Freq: Every day | ORAL | Status: DC
  Administered 2022-01-01 – 2022-01-14 (×14): 250 mg via ORAL
  Filled 2022-01-01 (×2): qty 5
  Filled 2022-01-01 (×3): qty 1
  Filled 2022-01-01: qty 5
  Filled 2022-01-01: qty 1
  Filled 2022-01-01: qty 5
  Filled 2022-01-01 (×2): qty 1
  Filled 2022-01-01: qty 5
  Filled 2022-01-01: qty 1
  Filled 2022-01-01: qty 5
  Filled 2022-01-01 (×2): qty 1

## 2022-01-01 MED ORDER — DEXAMETHASONE SODIUM PHOSPHATE 10 MG/ML IJ SOLN
10.0000 mg | Freq: Once | INTRAMUSCULAR | Status: AC
  Administered 2022-01-01: 10 mg via INTRAVENOUS
  Filled 2022-01-01: qty 1

## 2022-01-01 MED ORDER — ACETAMINOPHEN 325 MG PO TABS
650.0000 mg | ORAL_TABLET | Freq: Four times a day (QID) | ORAL | Status: DC | PRN
  Administered 2022-01-06 – 2022-01-09 (×2): 650 mg via ORAL
  Filled 2022-01-01 (×2): qty 2

## 2022-01-01 MED ORDER — CHLORHEXIDINE GLUCONATE CLOTH 2 % EX PADS
6.0000 | MEDICATED_PAD | Freq: Every day | CUTANEOUS | Status: DC
  Administered 2022-01-01 – 2022-01-04 (×3): 6 via TOPICAL

## 2022-01-01 MED ORDER — IPRATROPIUM-ALBUTEROL 0.5-2.5 (3) MG/3ML IN SOLN
3.0000 mL | Freq: Four times a day (QID) | RESPIRATORY_TRACT | Status: DC
  Administered 2022-01-01 – 2022-01-05 (×16): 3 mL via RESPIRATORY_TRACT
  Filled 2022-01-01 (×17): qty 3

## 2022-01-01 MED ORDER — IPRATROPIUM-ALBUTEROL 0.5-2.5 (3) MG/3ML IN SOLN
RESPIRATORY_TRACT | Status: AC
  Filled 2022-01-01: qty 3

## 2022-01-01 MED ORDER — GABAPENTIN 400 MG PO CAPS
400.0000 mg | ORAL_CAPSULE | Freq: Two times a day (BID) | ORAL | Status: DC
  Administered 2022-01-02 – 2022-01-15 (×27): 400 mg via ORAL
  Filled 2022-01-01 (×27): qty 1

## 2022-01-01 MED ORDER — IPRATROPIUM-ALBUTEROL 0.5-2.5 (3) MG/3ML IN SOLN
3.0000 mL | Freq: Once | RESPIRATORY_TRACT | Status: AC
  Administered 2022-01-01: 3 mL via RESPIRATORY_TRACT

## 2022-01-01 MED ORDER — MONTELUKAST SODIUM 10 MG PO TABS
10.0000 mg | ORAL_TABLET | Freq: Every day | ORAL | Status: DC
  Administered 2022-01-01 – 2022-01-14 (×14): 10 mg via ORAL
  Filled 2022-01-01 (×14): qty 1

## 2022-01-01 MED ORDER — ZINC SULFATE 220 (50 ZN) MG PO CAPS
220.0000 mg | ORAL_CAPSULE | Freq: Every day | ORAL | Status: DC
  Administered 2022-01-01 – 2022-01-15 (×15): 220 mg via ORAL
  Filled 2022-01-01 (×16): qty 1

## 2022-01-01 MED ORDER — METHYLPREDNISOLONE SODIUM SUCC 125 MG IJ SOLR
120.0000 mg | INTRAMUSCULAR | Status: DC
  Administered 2022-01-01: 120 mg via INTRAVENOUS
  Filled 2022-01-01: qty 2

## 2022-01-01 MED ORDER — VITAMIN C 500 MG PO TABS
500.0000 mg | ORAL_TABLET | Freq: Every day | ORAL | Status: DC
  Administered 2022-01-01 – 2022-01-15 (×15): 500 mg via ORAL
  Filled 2022-01-01 (×16): qty 1

## 2022-01-01 MED ORDER — INFLUENZA VAC SPLIT QUAD 0.5 ML IM SUSY
0.5000 mL | PREFILLED_SYRINGE | INTRAMUSCULAR | Status: AC
  Administered 2022-01-15: 0.5 mL via INTRAMUSCULAR
  Filled 2022-01-01: qty 0.5

## 2022-01-01 MED ORDER — VENLAFAXINE HCL ER 75 MG PO CP24
75.0000 mg | ORAL_CAPSULE | Freq: Every day | ORAL | Status: DC
  Administered 2022-01-02 – 2022-01-15 (×14): 75 mg via ORAL
  Filled 2022-01-01 (×15): qty 1

## 2022-01-01 MED ORDER — DM-GUAIFENESIN ER 30-600 MG PO TB12
1.0000 | ORAL_TABLET | Freq: Two times a day (BID) | ORAL | Status: DC
  Administered 2022-01-01 – 2022-01-12 (×22): 1 via ORAL
  Filled 2022-01-01 (×23): qty 1

## 2022-01-01 NOTE — ED Provider Notes (Signed)
[facility] EMERGENCY DEPT Provider Note   CSN: [id number] Arrival date & time: 01/01/22  2952     History  Chief Complaint  Patient presents with   Shortness of Breath    Crystal Duarte is a 61 y.o. female.   Shortness of Breath Associated symptoms: cough and wheezing      61 year old female with medical history significant for COPD, not on home oxygen, depression who presents to the emergency department with shortness of breath.  The patient states that she had COVID in early August of this year.  She felt symptomatic and then improved.  She has since developed over the last couple of days congestion with no fevers.  She endorses worsening dyspnea on exertion and has had a productive cough.  Her home nebulizer and inhaler has not helped.  She denies any chest pain.  She endorses persistent wheezing.  Home Medications Prior to Admission medications   Medication Sig Start Date End Date Taking? Authorizing Provider  albuterol (VENTOLIN HFA) 108 (90 Base) MCG/ACT inhaler Inhale 2 puffs into the lungs every 4 (four) hours as needed. 8/41/32   Prince Rome, PA-C  busPIRone (BUSPAR) 10 MG tablet Take two tablets three times daily for anxiety. 05/18/21   Mozingo, Berdie Ogren, NP  Cholecalciferol (VITAMIN D3 PO) Take by mouth.    [provider]  gabapentin (NEURONTIN) 400 MG capsule Take 1 capsule (400 mg total) by mouth 2 (two) times daily. 05/18/21   Mozingo, Berdie Ogren, NP  gabapentin (NEURONTIN) 600 MG tablet Take 1 tablet (600 mg total) by mouth daily. 05/18/21   Mozingo, Berdie Ogren, NP  ipratropium-albuterol (DUONEB) 0.5-2.5 (3) MG/3ML SOLN Take 3 mLs by nebulization every 6 (six) hours as needed. 4/40/10   Prince Rome, PA-C  montelukast (SINGULAIR) 10 MG tablet TAKE ONE TABLET BY MOUTH EVERY NIGHT AT BEDTIME 12/08/21   Martyn Ehrich, NP  Multiple Vitamin (MULTI-VITAMINS) TABS Take 1 tablet by mouth daily.     [provider]  omeprazole (PRILOSEC) 20 MG capsule TAKE ONE CAPSULE BY MOUTH DAILY 12/08/21   Gatha Mayer, MD  propranolol (INDERAL) 10 MG tablet Take 1 tablet (10 mg total) by mouth 3 (three) times daily. 12/13/21   Mozingo, Berdie Ogren, NP  rosuvastatin (CRESTOR) 20 MG tablet Take 1 tablet (20 mg total) by mouth daily. 02/15/21   Werner Lean, MD  traZODone (DESYREL) 100 MG tablet Take 2.5 tablets (250 mg total) by mouth at bedtime. 05/18/21   Mozingo, Berdie Ogren, NP  triamcinolone ointment (KENALOG) 0.5 % Apply 1 Application topically 2 (two) times daily. 10/06/21   Chrzanowski, Wende Crease B, NP  venlafaxine XR (EFFEXOR-XR) 150 MG 24 hr capsule Take 1 capsule (150 mg total) by mouth daily with breakfast. 05/18/21   Mozingo, Berdie Ogren, NP  venlafaxine XR (EFFEXOR-XR) 75 MG 24 hr capsule Take 1 capsule (75 mg total) by mouth daily. 12/13/21   Mozingo, Berdie Ogren, NP      Allergies    Other, Procaine, Latuda [lurasidone hcl], and Sulfa antibiotics    Review of Systems   Review of Systems  Respiratory:  Positive for cough, shortness of breath and wheezing.   All other systems reviewed and are negative.   Physical Exam Updated Vital Signs BP 113/79   Pulse 79   Temp 98.5 F (36.9 C) (Oral)   Resp (!) 21   SpO2 98%  Physical Exam Vitals and nursing note reviewed.  Constitutional:  General: She is not in acute distress.    Appearance: She is well-developed.  HENT:     Head: Normocephalic and atraumatic.  Eyes:     Conjunctiva/sclera: Conjunctivae normal.  Cardiovascular:     Rate and Rhythm: Normal rate and regular rhythm.     Heart sounds: No murmur heard. Pulmonary:     Effort: Pulmonary effort is normal. Tachypnea present. No respiratory distress.     Breath sounds: Examination of the right-upper field reveals wheezing. Examination of the left-upper field reveals wheezing. Examination of the right-middle field reveals wheezing. Examination of the  left-middle field reveals wheezing. Examination of the right-lower field reveals wheezing. Examination of the left-lower field reveals wheezing. Wheezing present.     Comments: Diffuse inspiratory and expiratory wheezing present in all lung fields, mild tachypnea present Abdominal:     Palpations: Abdomen is soft.     Tenderness: There is no abdominal tenderness.  Musculoskeletal:        General: No swelling.     Cervical back: Neck supple.  Skin:    General: Skin is warm and dry.     Capillary Refill: Capillary refill takes less than 2 seconds.  Neurological:     Mental Status: She is alert.  Psychiatric:        Mood and Affect: Mood normal.     ED Results / Procedures / Treatments   Labs (all labs ordered are listed, but only abnormal results are displayed) Labs Reviewed  RESP PANEL BY RT-PCR (FLU A&B, COVID) ARPGX2 - Abnormal; Notable for the following components:      Result Value   SARS Coronavirus 2 by RT PCR POSITIVE (*)    All other components within normal limits  CBC WITH DIFFERENTIAL/PLATELET - Abnormal; Notable for the following components:   WBC 10.9 (*)    Neutro Abs 8.5 (*)    All other components within normal limits  BASIC METABOLIC PANEL - Abnormal; Notable for the following components:   Glucose, Bld 148 (*)    All other components within normal limits    EKG EKG Interpretation  Date/Time:  Saturday January 01 2022 10:30:16 EDT Ventricular Rate:  93 PR Interval:  138 QRS Duration: 87 QT Interval:  365 QTC Calculation: 440 R Axis:   70 Text Interpretation: Sinus rhythm Confirmed by Regan Lemming ([id number]) on 01/01/2022 1:49:03 PM  Radiology DG Chest 2 View  Result Date: 01/01/2022 CLINICAL DATA:  Shortness of breath. COVID about a month ago. History of COPD. EXAM: CHEST - 2 VIEW COMPARISON:  Chest two views 12/16/2021 and 11/12/2021; CT chest 10/15/2021 FINDINGS: Cardiac silhouette and mediastinal contours are within normal limits. Postsurgical  changes with suture are seen within the superolateral right lung, unchanged from prior. Mild bilateral chronic interstitial thickening is unchanged. No new focal airspace opacity. No pleural effusion or pneumothorax. Mild multilevel degenerative disc changes of the midthoracic spine. IMPRESSION: 1. No acute cardiopulmonary process. 2. Postsurgical changes of the right lung, unchanged from prior. Electronically Signed   By: Yvonne Kendall M.D.   On: 01/01/2022 10:59    Procedures Procedures    Medications Ordered in ED Medications  albuterol (VENTOLIN HFA) 108 (90 Base) MCG/ACT inhaler 2 puff (has no administration in time range)  ipratropium-albuterol (DUONEB) 0.5-2.5 (3) MG/3ML nebulizer solution 3 mL (3 mLs Nebulization Given 01/01/22 1316)  dexamethasone (DECADRON) injection 10 mg (has no administration in time range)  magnesium sulfate IVPB 2 g 50 mL (has no administration in time range)  ipratropium-albuterol (  DUONEB) 0.5-2.5 (3) MG/3ML nebulizer solution 3 mL (3 mLs Nebulization Not Given 01/01/22 1034)  ipratropium-albuterol (DUONEB) 0.5-2.5 (3) MG/3ML nebulizer solution 3 mL (3 mLs Nebulization Given 01/01/22 1036)    ED Course/ Medical Decision Making/ A&P Clinical Course as of 01/01/22 1350  Sat Jan 01, 2022  1308 SARS Coronavirus 2 by RT PCR(!): POSITIVE [JL]    Clinical Course User Index [JL] Regan Lemming, MD                           Medical Decision Making Amount and/or Complexity of Data Reviewed Labs: ordered. Decision-making details documented in ED Course. Radiology: ordered.  Risk Prescription drug management. Decision regarding hospitalization.     61 year old female with medical history significant for COPD, not on home oxygen, depression who presents to the emergency department with shortness of breath.  The patient states that she had COVID in early August of this year.  She felt symptomatic and then improved.  She has since developed over the last couple of  days congestion with no fevers.  She endorses worsening dyspnea on exertion and has had a productive cough.  Her home nebulizer and inhaler has not helped.  She denies any chest pain.  She endorses persistent wheezing.  Arrival, the patient was vitally stable, afebrile, mildly tachycardic P105, not tachypneic RR 20, BP 140/80, saturating 91% on room air.  The patient subsequently had desaturations without ambulation while at rest to 86% with a good waveform.  She was subsequently placed on 2 L O2 via nasal cannula due to persistent desaturation.  She showed improvement following administration of oxygen.  Frontal diagnosis includes viral URI resulting in COPD exacerbation, COVID-19, bacterial pneumonia, pneumothorax.  Patient's initial EKG revealed sinus rhythm, ventricular rate 93, no ischemic changes noted.  A chest x-ray was performed which revealed postsurgical changes of the right lung, no acute cardiopulmonary process.  Laboratory evaluation significant for CBC with a mild leukocytosis to 10.9, nonspecific, no anemia, BMP unremarkable, COVID-19 PCR testing did result positive.  The patient was administered 3 DuoNeb's, IV magnesium and IV Decadron.  Given the patient's diffuse wheezing, concern for COPD exacerbation in the setting of COVID-19 infection.  As the patient did have hypoxia on room air while at rest to 86% and is now on oxygen, medicine consulted for admission for further management of the patient's COVID-19 infection versus COPD exacerbation.  Unclear if the patient has not fully clear the virus from her previous infection 2 months ago versus this being a new infection.  I favor likely new infection with COVID-19 and resultant COPD exacerbation.  Hospitalist medicine consulted for admission, Dr. Sherral Hammers accepting.   Final Clinical Impression(s) / ED Diagnoses Final diagnoses:  COVID-19  COPD exacerbation [facility])    Rx / DC Orders ED Discharge Orders     None         Regan Lemming, MD 01/01/22 1429

## 2022-01-01 NOTE — ED Notes (Signed)
Ms. Case's O2 sat was 87% on RA.Marland Kitchen RT placed on 2L O2 via [place].  O2 sat now = 97%

## 2022-01-01 NOTE — Plan of Care (Signed)
Discussed with patient earlier plan of care for the evening, pain management and clustering care with some teach back displayed.   Patient medications were reconciled by Primary RN in the room and Pharmacy over the phone.  Patient passed a swallow screen prior to giving medications with sips of water at this time.  Problem: Education: Goal: Knowledge of risk factors and measures for prevention of condition will improve Outcome: Progressing   Problem: Coping: Goal: Psychosocial and spiritual needs will be supported Outcome: Progressing   Problem: Respiratory: Goal: Will maintain a patent airway Outcome: Progressing

## 2022-01-01 NOTE — Progress Notes (Signed)
PROGRESS NOTE    Crystal Duarte  HFW:[id number] DOB: 1960/04/17 DOA: 01/01/2022 PCP: [name], MD   Brief Narrative:   [name] PMHx COPD, not on home oxygen, Depression, HLD,  Presents to the emergency department with shortness of breath.  The patient states that she had COVID in early August of this year.  She felt symptomatic and then improved.  She has since developed over the last couple of days congestion with no fevers.  She endorses worsening dyspnea on exertion and has had a productive cough.  Her home nebulizer and inhaler has not helped.  She denies any chest pain.  She endorses persistent wheezing.    Subjective: A/O x4, positive SOB, positive CP, positive tachypnea,   Assessment & Plan:  Covid vaccination;  Principal Problem:   Pneumonia due to COVID-19 virus Active Problems:   Severe episode of recurrent major depressive disorder, without psychotic features (HCC)   PAD (peripheral artery disease) (HCC)   Chest pain in adult   Shortness of breath  Chest pain/PAD - Trend troponin - Echocardiogram pending -Most likely secondary to her COVID pneumonia but given her smoking history and lack of ever having her cardiac history evaluated will obtain.  COVID Pneumonia COVID-19 Labs  Recent Labs    01/01/22 1514  DDIMER 0.78*  FERRITIN 141  LDH 184  CRP 8.3*    Lab Results  Component Value Date   SARSCOV2NAA POSITIVE (A) 01/01/2022   SARSCOV2NAA POSITIVE (A) 11/12/2021   [place] Not Detected 12/31/2019   [place] NEGATIVE 12/04/2018  -Remdesivir per pharmacy protocol - Solu-Medrol 60 mg daily - DuoNeb QID - Mucinex DM BID - Flutter valve - Incentive spirometry - Daily inflammatory markers -Vitamin C 500 mg daily - Zinc 220 mg daily - Patient currently at high risk of requiring intubation given use of accessory muscles and RR 28.  Recurrent severe depressive disorder - Restart home medication    Mobility Assessment  (last 72 hours)     Mobility Assessment     Row Name 01/01/22 1635           Does patient have an order for bedrest or is patient medically unstable No - Continue assessment       What is the highest level of mobility based on the progressive mobility assessment? Level 5 (Walks with assist in room/hall) - Balance while stepping forward/back and can walk in room with assist - Complete                DVT prophylaxis: Lovenox Code Status: Full Family Communication:  Status is: Inpatient    Dispo: The patient is from: Home              Anticipated d/c is to: Home              Anticipated d/c date is: > 3 days              Patient currently is not medically stable to d/c.      Consultants:    Procedures/Significant Events:     I have personally reviewed and interpreted all radiology studies and my findings are as above.  VENTILATOR SETTINGS: Nasal cannula 9/30 Flow 2 L/min SPO2 92% Rate 28  Cultures   Antimicrobials: Anti-infectives (From admission, onward)    Start     Dose/Rate Route Frequency Ordered Stop   01/02/22 1000  remdesivir 100 mg in sodium chloride 0.9 % 100 mL IVPB  See Hyperspace for full Linked Orders Report.   100 mg 200 mL/hr over 30 Minutes Intravenous Daily 01/01/22 1827 01/04/22 0959   01/01/22 2000  remdesivir 200 mg in sodium chloride 0.9% 250 mL IVPB       See Hyperspace for full Linked Orders Report.   200 mg 580 mL/hr over 30 Minutes Intravenous Once 01/01/22 1827           Devices    LINES / TUBES:      Continuous Infusions:  remdesivir 200 mg in sodium chloride 0.9% 250 mL IVPB     Followed by   Derrill Memo ON 01/02/2022] remdesivir 100 mg in sodium chloride 0.9 % 100 mL IVPB       Objective: Vitals:   01/01/22 1400 01/01/22 1513 01/01/22 1637 01/01/22 1822  BP: (!) 142/70 133/71 133/81 (!) 146/82  Pulse: 81 81 88 85  Resp: (!) 26 20 (!) 28 (!) 26  Temp:  98.8 F (37.1 C) 98.6 F (37 C) 99.1 F (37.3 C)   TempSrc:  Oral Oral Oral  SpO2: 96% 95% 99% 92%   No intake or output data in the 24 hours ending 01/01/22 1842 There were no vitals filed for this visit.  Examination:  General: A/O x4, positive acute respiratory distress Eyes: negative scleral hemorrhage, negative anisocoria, negative icterus ENT: Negative Runny nose, negative gingival bleeding, Neck:  Negative scars, masses, torticollis, lymphadenopathy, JVD Lungs: tachypnea, decreased breath sounds bilaterally, positive expiratory wheezes bilateral, negative crackles.  Positive use of accessory muscles Cardiovascular: Tachycardic, without murmur gallop or rub normal S1 and S2 Abdomen: negative abdominal pain, nondistended, positive soft, bowel sounds, no rebound, no ascites, no appreciable mass Extremities: No significant cyanosis, clubbing, or edema bilateral lower extremities Skin: Negative rashes, lesions, ulcers Psychiatric:  Negative depression, negative anxiety, negative fatigue, negative mania  Central nervous system:  Cranial nerves II through XII intact, tongue/uvula midline, all extremities muscle strength 5/5, sensation intact throughout, negative dysarthria, negative expressive aphasia, negative receptive aphasia.  .     Data Reviewed: Care during the described time interval was provided by me .  I have reviewed this patient's available data, including medical history, events of note, physical examination, and all test results as part of my evaluation.   CBC: Recent Labs  Lab 01/01/22 1149 01/01/22 1514 01/01/22 1522  WBC 10.9* 9.2  --   NEUTROABS 8.5*  --   --   HGB 13.6 13.7 13.9  HCT 40.9 41.8 41.0  MCV 96.9 97.2  --   PLT 212 181  --    Basic Metabolic Panel: Recent Labs  Lab 01/01/22 1149 01/01/22 1514 01/01/22 1522  NA 140  --  138  K 4.5  --  4.3  CL 103  --   --   CO2 22  --   --   GLUCOSE 148*  --   --   BUN 8  --   --   CREATININE 0.81 0.77  --   CALCIUM 9.2  --   --    GFR: CrCl  cannot be calculated (Unknown ideal weight.). Liver Function Tests: No results for input(s): "AST", "ALT", "ALKPHOS", "BILITOT", "PROT", "ALBUMIN" in the last 168 hours. No results for input(s): "LIPASE", "AMYLASE" in the last 168 hours. No results for input(s): "AMMONIA" in the last 168 hours. Coagulation Profile: No results for input(s): "INR", "PROTIME" in the last 168 hours. Cardiac Enzymes: No results for input(s): "CKTOTAL", "CKMB", "CKMBINDEX", "TROPONINI" in the last 168 hours. BNP (  last 3 results) Recent Labs    02/12/21 1409  PROBNP 57   HbA1C: No results for input(s): "HGBA1C" in the last 72 hours. CBG: No results for input(s): "GLUCAP" in the last 168 hours. Lipid Profile: No results for input(s): "CHOL", "HDL", "LDLCALC", "TRIG", "CHOLHDL", "LDLDIRECT" in the last 72 hours. Thyroid Function Tests: No results for input(s): "TSH", "T4TOTAL", "FREET4", "T3FREE", "THYROIDAB" in the last 72 hours. Anemia Panel: Recent Labs    01/01/22 1514  FERRITIN 141   Urine analysis:    Component Value Date/Time   COLORURINE YELLOW 10/06/2021 1536   APPEARANCEUR CLEAR 10/06/2021 1536   APPEARANCEUR Clear 05/20/2013 1633   LABSPEC 1.010 10/06/2021 1536   LABSPEC 1.021 05/20/2013 1633   PHURINE 7.0 10/06/2021 1536   GLUCOSEU NEGATIVE 10/06/2021 1536   GLUCOSEU Negative 05/20/2013 1633   HGBUR TRACE (A) 10/06/2021 1536   BILIRUBINUR Neg 10/08/2018 1424   BILIRUBINUR Negative 05/20/2013 1633   KETONESUR NEGATIVE 10/06/2021 1536   PROTEINUR NEGATIVE 10/06/2021 1536   UROBILINOGEN 0.2 10/08/2018 1424   UROBILINOGEN 0.2 09/23/2014 1014   NITRITE Neg 10/08/2018 1424   NITRITE NEGATIVE 11/14/2017 1844   LEUKOCYTESUR Negative 10/08/2018 1424   LEUKOCYTESUR Negative 05/20/2013 1633   Sepsis Labs: '@LABRCNTIP'$ (procalcitonin:4,lacticidven:4)  ) Recent Results (from the past 240 hour(s))  Resp Panel by RT-PCR (Flu A&B, Covid) Anterior Nasal Swab     Status: Abnormal   Collection  Time: 01/01/22 10:51 AM   Specimen: Anterior Nasal Swab  Result Value Ref Range Status   SARS Coronavirus 2 by RT PCR POSITIVE (A) NEGATIVE Final    Comment: (NOTE) SARS-CoV-2 target nucleic acids are DETECTED.  The SARS-CoV-2 RNA is generally detectable in upper respiratory specimens during the acute phase of infection. Positive results are indicative of the presence of the identified virus, but do not rule out bacterial infection or co-infection with other pathogens not detected by the test. Clinical correlation with patient history and other diagnostic information is necessary to determine patient infection status. The expected result is Negative.  Fact Sheet for Patients: EntrepreneurPulse.com.au  Fact Sheet for Healthcare Providers: IncredibleEmployment.be  This test is not yet approved or cleared by the [place] FDA and  has been authorized for detection and/or diagnosis of SARS-CoV-2 by FDA under an Emergency Use Authorization (EUA).  This EUA will remain in effect (meaning this test can be used) for the duration of  the COVID-19 declaration under Section 564(b)(1) of the A ct, 21 U.S.C. section 360bbb-3(b)(1), unless the authorization is terminated or revoked sooner.     Influenza A by PCR NEGATIVE NEGATIVE Final   Influenza B by PCR NEGATIVE NEGATIVE Final    Comment: (NOTE) The Xpert Xpress SARS-CoV-2/FLU/RSV plus assay is intended as an aid in the diagnosis of influenza from Nasopharyngeal swab specimens and should not be used as a sole basis for treatment. Nasal washings and aspirates are unacceptable for Xpert Xpress SARS-CoV-2/FLU/RSV testing.  Fact Sheet for Patients: EntrepreneurPulse.com.au  Fact Sheet for Healthcare Providers: IncredibleEmployment.be  This test is not yet approved or cleared by the [place] FDA and has been authorized for detection and/or diagnosis of  SARS-CoV-2 by FDA under an Emergency Use Authorization (EUA). This EUA will remain in effect (meaning this test can be used) for the duration of the COVID-19 declaration under Section 564(b)(1) of the Act, 21 U.S.C. section 360bbb-3(b)(1), unless the authorization is terminated or revoked.  Performed at [facility], [address], [place],  [address]  Radiology Studies: DG Chest 1 View  Result Date: 01/01/2022 CLINICAL DATA:  COVID positive with shortness of breath. EXAM: CHEST  1 VIEW COMPARISON:  January 01, 2022 FINDINGS: The heart size and mediastinal contours are within normal limits. Surgical sutures are seen within the lateral aspect of the upper right lung. Both lungs are otherwise clear. The visualized skeletal structures are unremarkable. IMPRESSION: Postoperative changes involving the upper right lung, without evidence of acute or active cardiopulmonary disease. Electronically Signed   By: Virgina Norfolk M.D.   On: 01/01/2022 17:40   DG Chest 2 View  Result Date: 01/01/2022 CLINICAL DATA:  Shortness of breath. COVID about a month ago. History of COPD. EXAM: CHEST - 2 VIEW COMPARISON:  Chest two views 12/16/2021 and 11/12/2021; CT chest 10/15/2021 FINDINGS: Cardiac silhouette and mediastinal contours are within normal limits. Postsurgical changes with suture are seen within the superolateral right lung, unchanged from prior. Mild bilateral chronic interstitial thickening is unchanged. No new focal airspace opacity. No pleural effusion or pneumothorax. Mild multilevel degenerative disc changes of the midthoracic spine. IMPRESSION: 1. No acute cardiopulmonary process. 2. Postsurgical changes of the right lung, unchanged from prior. Electronically Signed   By: Yvonne Kendall M.D.   On: 01/01/2022 10:59        Scheduled Meds:  ascorbic acid  500 mg Oral Daily   dextromethorphan-guaiFENesin  1 tablet Oral BID   enoxaparin (LOVENOX)  injection  40 mg Subcutaneous Q24H   [START ON 01/02/2022] influenza vac split quadrivalent PF  0.5 mL Intramuscular Tomorrow-1000   ipratropium-albuterol  3 mL Nebulization Q6H   methylPREDNISolone (SOLU-MEDROL) injection  120 mg Intravenous Q24H   methylPREDNISolone (SOLU-MEDROL) injection  60 mg Intravenous Daily   zinc sulfate  220 mg Oral Daily   Continuous Infusions:  remdesivir 200 mg in sodium chloride 0.9% 250 mL IVPB     Followed by   Derrill Memo ON 01/02/2022] remdesivir 100 mg in sodium chloride 0.9 % 100 mL IVPB       LOS: 0 days   The patient is critically ill with multiple organ systems failure and requires high complexity decision making for assessment and support, frequent evaluation and titration of therapies, application of advanced monitoring technologies and extensive interpretation of multiple databases. Critical Care Time devoted to patient care services described in this note  Time spent: 40 minutes     Lache Dagher, Geraldo Docker, MD [facility]   If 7PM-7AM, please contact night-coverage 01/01/2022, 6:42 PM

## 2022-01-01 NOTE — ED Triage Notes (Signed)
Had covid about a month ago, felt little better, then started with congestion/no fevers. Hard time breathing, home nebs and inhaler did nt help.

## 2022-01-01 NOTE — Progress Notes (Signed)
Patient arrived to [facility] via [facility], dyspnea at rest, RR 28, but able maintain O2 sats at 92% on 2L. Paged MD. Received orders for PRN albuterol. Breathing treatment given with no improvement,  expiratory wheezes bilaterally.  MD came to bedside. The decision was made to transfer her to a higher level of care, report called to [place], Therapist, sports.

## 2022-01-02 ENCOUNTER — Inpatient Hospital Stay (HOSPITAL_COMMUNITY): Payer: Medicare HMO

## 2022-01-02 DIAGNOSIS — J441 Chronic obstructive pulmonary disease with (acute) exacerbation: Secondary | ICD-10-CM

## 2022-01-02 DIAGNOSIS — R079 Chest pain, unspecified: Secondary | ICD-10-CM

## 2022-01-02 DIAGNOSIS — J206 Acute bronchitis due to rhinovirus: Secondary | ICD-10-CM | POA: Diagnosis present

## 2022-01-02 DIAGNOSIS — I509 Heart failure, unspecified: Secondary | ICD-10-CM

## 2022-01-02 DIAGNOSIS — E669 Obesity, unspecified: Secondary | ICD-10-CM | POA: Diagnosis not present

## 2022-01-02 DIAGNOSIS — F310 Bipolar disorder, current episode hypomanic: Secondary | ICD-10-CM

## 2022-01-02 DIAGNOSIS — U071 COVID-19: Secondary | ICD-10-CM | POA: Diagnosis not present

## 2022-01-02 DIAGNOSIS — F411 Generalized anxiety disorder: Secondary | ICD-10-CM

## 2022-01-02 DIAGNOSIS — R0602 Shortness of breath: Secondary | ICD-10-CM

## 2022-01-02 DIAGNOSIS — I739 Peripheral vascular disease, unspecified: Secondary | ICD-10-CM

## 2022-01-02 DIAGNOSIS — F332 Major depressive disorder, recurrent severe without psychotic features: Secondary | ICD-10-CM

## 2022-01-02 LAB — CBC WITH DIFFERENTIAL/PLATELET
Abs Immature Granulocytes: 0.03 10*3/uL (ref 0.00–0.07)
Basophils Absolute: 0 10*3/uL (ref 0.0–0.1)
Basophils Relative: 0 %
Eosinophils Absolute: 0 10*3/uL (ref 0.0–0.5)
Eosinophils Relative: 0 %
HCT: 43.2 % (ref 36.0–46.0)
Hemoglobin: 14.1 g/dL (ref 12.0–15.0)
Immature Granulocytes: 0 %
Lymphocytes Relative: 12 %
Lymphs Abs: 1.4 10*3/uL (ref 0.7–4.0)
MCH: 32 pg (ref 26.0–34.0)
MCHC: 32.6 g/dL (ref 30.0–36.0)
MCV: 98 fL (ref 80.0–100.0)
Monocytes Absolute: 0.6 10*3/uL (ref 0.1–1.0)
Monocytes Relative: 5 %
Neutro Abs: 9.9 10*3/uL — ABNORMAL HIGH (ref 1.7–7.7)
Neutrophils Relative %: 83 %
Platelets: 232 10*3/uL (ref 150–400)
RBC: 4.41 MIL/uL (ref 3.87–5.11)
RDW: 12.9 % (ref 11.5–15.5)
WBC: 11.9 10*3/uL — ABNORMAL HIGH (ref 4.0–10.5)
nRBC: 0 % (ref 0.0–0.2)

## 2022-01-02 LAB — PHOSPHORUS: Phosphorus: 3.4 mg/dL (ref 2.5–4.6)

## 2022-01-02 LAB — ECHOCARDIOGRAM COMPLETE
AR max vel: 2.52 cm2
AV Peak grad: 7.6 mmHg
Ao pk vel: 1.38 m/s
Area-P 1/2: 4.52 cm2
Height: 64 in
S' Lateral: 2.1 cm
Weight: 3022.95 oz

## 2022-01-02 LAB — COMPREHENSIVE METABOLIC PANEL
ALT: 17 U/L (ref 0–44)
AST: 19 U/L (ref 15–41)
Albumin: 4 g/dL (ref 3.5–5.0)
Alkaline Phosphatase: 64 U/L (ref 38–126)
Anion gap: 10 (ref 5–15)
BUN: 13 mg/dL (ref 8–23)
CO2: 26 mmol/L (ref 22–32)
Calcium: 9.2 mg/dL (ref 8.9–10.3)
Chloride: 103 mmol/L (ref 98–111)
Creatinine, Ser: 0.6 mg/dL (ref 0.44–1.00)
GFR, Estimated: >60 mL/min (ref >60)
Glucose, Bld: 150 mg/dL — ABNORMAL HIGH (ref 70–99)
Potassium: 3.9 mmol/L (ref 3.5–5.1)
Sodium: 139 mmol/L (ref 135–145)
Total Bilirubin: 0.6 mg/dL (ref 0.3–1.2)
Total Protein: 7.4 g/dL (ref 6.5–8.1)

## 2022-01-02 LAB — MAGNESIUM: Magnesium: 2.2 mg/dL (ref 1.7–2.4)

## 2022-01-02 LAB — C-REACTIVE PROTEIN
CRP: 6.8 mg/dL — ABNORMAL HIGH (ref <1.0)
CRP: 5.7 mg/dL — ABNORMAL HIGH (ref <1.0)

## 2022-01-02 LAB — FERRITIN: Ferritin: 110 ng/mL (ref 11–307)

## 2022-01-02 LAB — GLUCOSE, CAPILLARY
Glucose-Capillary: 141 mg/dL — ABNORMAL HIGH (ref 70–99)
Glucose-Capillary: 174 mg/dL — ABNORMAL HIGH (ref 70–99)

## 2022-01-02 LAB — HEPATITIS B SURFACE ANTIGEN: Hepatitis B Surface Ag: NONREACTIVE

## 2022-01-02 MED ORDER — INSULIN ASPART 100 UNIT/ML IJ SOLN
0.0000 [IU] | INTRAMUSCULAR | Status: DC
  Administered 2022-01-02: 3 [IU] via SUBCUTANEOUS
  Administered 2022-01-02 – 2022-01-03 (×2): 2 [IU] via SUBCUTANEOUS
  Administered 2022-01-03: 5 [IU] via SUBCUTANEOUS
  Administered 2022-01-03: 3 [IU] via SUBCUTANEOUS
  Administered 2022-01-03: 2 [IU] via SUBCUTANEOUS
  Administered 2022-01-04: 3 [IU] via SUBCUTANEOUS
  Administered 2022-01-04 – 2022-01-05 (×3): 2 [IU] via SUBCUTANEOUS
  Administered 2022-01-05: 8 [IU] via SUBCUTANEOUS
  Administered 2022-01-05: 2 [IU] via SUBCUTANEOUS
  Administered 2022-01-05: 8 [IU] via SUBCUTANEOUS
  Administered 2022-01-06 (×2): 5 [IU] via SUBCUTANEOUS
  Administered 2022-01-06: 2 [IU] via SUBCUTANEOUS
  Administered 2022-01-07: 3 [IU] via SUBCUTANEOUS
  Administered 2022-01-07 (×2): 2 [IU] via SUBCUTANEOUS
  Administered 2022-01-07: 3 [IU] via SUBCUTANEOUS
  Administered 2022-01-08: 2 [IU] via SUBCUTANEOUS
  Administered 2022-01-08: 5 [IU] via SUBCUTANEOUS
  Administered 2022-01-08 (×3): 3 [IU] via SUBCUTANEOUS
  Administered 2022-01-08 – 2022-01-09 (×2): 2 [IU] via SUBCUTANEOUS
  Administered 2022-01-09 (×2): 3 [IU] via SUBCUTANEOUS
  Administered 2022-01-09: 2 [IU] via SUBCUTANEOUS
  Administered 2022-01-09 – 2022-01-10 (×3): 3 [IU] via SUBCUTANEOUS
  Administered 2022-01-10 – 2022-01-11 (×3): 2 [IU] via SUBCUTANEOUS
  Administered 2022-01-11: 3 [IU] via SUBCUTANEOUS
  Administered 2022-01-11: 5 [IU] via SUBCUTANEOUS
  Administered 2022-01-11: 2 [IU] via SUBCUTANEOUS
  Administered 2022-01-12: 3 [IU] via SUBCUTANEOUS
  Administered 2022-01-12: 2 [IU] via SUBCUTANEOUS

## 2022-01-02 MED ORDER — LORAZEPAM 2 MG/ML IJ SOLN
1.0000 mg | Freq: Two times a day (BID) | INTRAMUSCULAR | Status: DC
  Administered 2022-01-02 – 2022-01-03 (×3): 1 mg via INTRAVENOUS
  Filled 2022-01-02 (×3): qty 1

## 2022-01-02 NOTE — H&P (Deleted)
[facility] History and Physical  Katalyn Matin Ishii FUX:[id number] DOB: 03-May-1960 DOA: 01/01/2022  Referring physician:  PCP: [name], MD   Chief Complaint: SOB  HPI: Crystal Duarte is a 61 y.o. female  PMHx COPD, not on home oxygen, Depression, HLD,   Presents to the emergency department with shortness of breath.  The patient states that she had COVID in early August of this year.  She felt symptomatic and then improved.  She has since developed over the last couple of days congestion with no fevers.  She endorses worsening dyspnea on exertion and has had a productive cough.  Her home nebulizer and inhaler has not helped.  She denies any chest pain.  She endorses persistent wheezing.  A/O x4, positive SOB, positive CP, positive tachypnea  Review of Systems:  Covid vaccination;  Constitutional:  No weight loss, night sweats, Fevers, chills, fatigue.  HEENT:  No headaches, Difficulty swallowing,Tooth/dental problems,Sore throat,  No sneezing, itching, ear ache, nasal congestion, post nasal drip,  Cardio-vascular:  Positive chest pain, negative orthopnea, PND, swelling in lower extremities, anasarca, dizziness, palpitations  GI:  No heartburn, indigestion, abdominal pain, nausea, vomiting, diarrhea, change in bowel habits, loss of appetite  Resp:  Positive shortness of breath with exertion or at rest. No excess mucus, no productive cough, No non-productive cough, No coughing up of blood.No change in color of mucus.Positive wheezing.No chest wall deformity  Skin:  no rash or lesions.  GU:  no dysuria, change in color of urine, no urgency or frequency. No flank pain.  Musculoskeletal:  No joint pain or swelling. No decreased range of motion. No back pain.  Psych:  No change in mood or affect. No depression or anxiety. No memory loss.   Past Medical History:  Diagnosis Date   Coccidioidomycosis, pulmonary (HCC)    COPD (chronic obstructive pulmonary  disease) (HCC)    Depression    Hx of adenomatous polyp of colon 12/04/2014   Major depressive disorder    since 14, electroconvulsive therapy, and transcranial magnetic stimulation recently for 6 weeks every day    Osteopenia 01/2019   T score -2.1 FRAX 9.6% / 2.1% stable from prior DEXA   Panic attack    PONV (postoperative nausea and vomiting)    VAIN I (vaginal intraepithelial neoplasia grade I) 2015   Positive HPV negative subtype 16, 18/45   Past Surgical History:  Procedure Laterality Date   ABDOMINAL HYSTERECTOMY     TAH BSO   BILATERAL VATS ABLATION     vats for biopsy due to infection   BREAST EXCISIONAL BIOPSY Right    benign   CATARACT EXTRACTION     COLONOSCOPY  2001   hemorrhoidectomy   HEMORRHOID SURGERY     LUNG SURGERY  2001   VATS surgery    OVARIAN CYST REMOVAL  1970's   ULNAR NERVE TRANSPOSITION Left 12/06/2018   Procedure: LEFT ULNAR NERVE DECOMPRESSION,  ;  Surgeon: Leanora Cover, MD;  Location: [facility];  Service: Orthopedics;  Laterality: Left;   VESICOVAGINAL FISTULA CLOSURE W/ TAH  2005   WRIST ARTHROSCOPY Left 03/13/2018   Procedure: ARTHROSCOPY LEFT WRIST WITH DEBRIDEMENT;  Surgeon: Leanora Cover, MD;  Location: [facility];  Service: Orthopedics;  Laterality: Left;  block   Social History:  reports that she has been smoking cigarettes. She has a 42.00 pack-year smoking history. She has never used smokeless tobacco. She reports that she does not drink alcohol and does not  use drugs.  Allergies  Allergen Reactions   Other Nausea And Vomiting    novacaine    Procaine Nausea And Vomiting   Latuda [Lurasidone Hcl] Anxiety   Sulfa Antibiotics Rash    Only in sunlight     Family History  Problem Relation Age of Onset   Colon polyps Mother    Asthma Mother    Ovarian cancer Mother    Varicose Veins Mother    Colon polyps Father    Heart disease Father    Peripheral vascular disease Father    Lung cancer Maternal  Grandmother    Colon cancer Neg Hx    Esophageal cancer Neg Hx    Stomach cancer Neg Hx    Pancreatic cancer Neg Hx    Liver disease Neg Hx      Prior to Admission medications   Medication Sig Start Date End Date Taking? Authorizing Provider  albuterol (VENTOLIN HFA) 108 (90 Base) MCG/ACT inhaler Inhale 2 puffs into the lungs every 4 (four) hours as needed. 11/12/21  Yes Prince Rome, PA-C  busPIRone (BUSPAR) 10 MG tablet Take two tablets three times daily for anxiety. 05/18/21  Yes Mozingo, Berdie Ogren, NP  Cholecalciferol (VITAMIN D3 PO) Take by mouth.   Yes [provider]  gabapentin (NEURONTIN) 400 MG capsule Take 1 capsule (400 mg total) by mouth 2 (two) times daily. 05/18/21  Yes Mozingo, Berdie Ogren, NP  gabapentin (NEURONTIN) 600 MG tablet Take 1 tablet (600 mg total) by mouth daily. 05/18/21  Yes Mozingo, Berdie Ogren, NP  ipratropium-albuterol (DUONEB) 0.5-2.5 (3) MG/3ML SOLN Take 3 mLs by nebulization every 6 (six) hours as needed. Patient taking differently: Take 3 mLs by nebulization every 6 (six) hours as needed (sob/wheezing). 11/12/21  Yes Prince Rome, PA-C  montelukast (SINGULAIR) 10 MG tablet TAKE ONE TABLET BY MOUTH EVERY NIGHT AT BEDTIME 12/08/21  Yes Martyn Ehrich, NP  Multiple Vitamin (MULTI-VITAMINS) TABS Take 1 tablet by mouth daily.    Yes [provider]  omeprazole (PRILOSEC) 20 MG capsule TAKE ONE CAPSULE BY MOUTH DAILY 12/08/21  Yes Gatha Mayer, MD  propranolol (INDERAL) 10 MG tablet Take 1 tablet (10 mg total) by mouth 3 (three) times daily. 12/13/21  Yes Mozingo, Berdie Ogren, NP  rosuvastatin (CRESTOR) 20 MG tablet Take 1 tablet (20 mg total) by mouth daily. 02/15/21  Yes Chandrasekhar, Mahesh A, MD  traZODone (DESYREL) 100 MG tablet Take 2.5 tablets (250 mg total) by mouth at bedtime. 05/18/21  Yes Mozingo, Berdie Ogren, NP  triamcinolone ointment (KENALOG) 0.5 % Apply 1 Application topically 2 (two) times daily.  10/06/21  Yes Chrzanowski, Jami B, NP  venlafaxine XR (EFFEXOR-XR) 150 MG 24 hr capsule Take 1 capsule (150 mg total) by mouth daily with breakfast. Patient taking differently: Take 150 mg by mouth daily with breakfast. Take along with 37.5 mg tablet=187.5 mg 05/18/21  Yes Mozingo, Berdie Ogren, NP  venlafaxine XR (EFFEXOR-XR) 37.5 MG 24 hr capsule Take 37.5 mg by mouth daily with breakfast. Take along with 150 mg tablet=187.5 mg   Yes [provider]  venlafaxine XR (EFFEXOR-XR) 75 MG 24 hr capsule Take 1 capsule (75 mg total) by mouth daily. Patient not taking: Reported on 01/01/2022 12/13/21   Mozingo, Berdie Ogren, NP     Consultants:    Procedures/Significant Events:  9/30 PCXR Postoperative changes involving the upper right lung, without evidence of acute or active cardiopulmonary disease.  I have personally reviewed and interpreted all  radiology studies and my findings are as above.   VENTILATOR SETTINGS: Nasal cannula 9/30 FiO2 2 L/min Rate: 30BPM SPO2 93%   Cultures 9/30 SARS coronavirus positive 9/30 rhinovirus positive 9/30 influenza A/B negative 9/30 hepatitis B surface Ag negative 9/30 HIV screen negative    Antimicrobials: Anti-infectives (From admission, onward)    Start     Ordered Stop   01/02/22 1000  remdesivir 100 mg in sodium chloride 0.9 % 100 mL IVPB       See Hyperspace for full Linked Orders Report.   01/01/22 1827 01/04/22 0959   01/01/22 2000  remdesivir 200 mg in sodium chloride 0.9% 250 mL IVPB       See Hyperspace for full Linked Orders Report.   01/01/22 1827 01/01/22 2115         Devices    LINES / TUBES:      Continuous Infusions:  remdesivir 100 mg in sodium chloride 0.9 % 100 mL IVPB      Physical Exam: Vitals:   01/02/22 0500 01/02/22 0600 01/02/22 0700 01/02/22 0816  BP: (!) 111/44 128/68 (!) 100/52   Pulse: 83 87 76   Resp: '16 20 16   '$ Temp:    98.1 F (36.7 C)  TempSrc:    Oral  SpO2: 97% 100% 100%    Weight:      Height:        Wt Readings from Last 3 Encounters:  01/01/22 85.7 kg  12/16/21 88.5 kg  11/12/21 88 kg    General: A/O x4, positive acute respiratory distress Eyes: negative scleral hemorrhage, negative anisocoria, negative icterus ENT: Negative Runny nose, negative gingival bleeding, Neck:  Negative scars, masses, torticollis, lymphadenopathy, JVD Lungs: tachypnea, decreased breath sounds bilaterally, positive expiratory wheezes bilateral, negative crackles.  Positive use of accessory muscles Cardiovascular: Tachycardic, without murmur gallop or rub normal S1 and S2 Abdomen: negative abdominal pain, nondistended, positive soft, bowel sounds, no rebound, no ascites, no appreciable mass Extremities: No significant cyanosis, clubbing, or edema bilateral lower extremities Skin: Negative rashes, lesions, ulcers Psychiatric:  Negative depression, negative anxiety, negative fatigue, negative mania  Central nervous system:  Cranial nerves II through XII intact, tongue/uvula midline, all extremities muscle strength 5/5, sensation intact throughout, negative dysarthria, negative expressive aphasia, negative receptive aphasia.        Labs on Admission:  Basic Metabolic Panel: Recent Labs  Lab 01/01/22 1149 01/01/22 1514 01/01/22 1522 01/01/22 2319  NA 140  --  138 137  K 4.5  --  4.3 4.0  CL 103  --   --  104  CO2 22  --   --  24  GLUCOSE 148*  --   --  177*  BUN 8  --   --  11  CREATININE 0.81 0.77  --  0.66  CALCIUM 9.2  --   --  8.5*  MG  --   --   --  2.2  PHOS  --   --   --  3.9   Liver Function Tests: Recent Labs  Lab 01/01/22 2319  AST 18  ALT 16  ALKPHOS 56  BILITOT 0.6  PROT 6.5  ALBUMIN 3.6   No results for input(s): "LIPASE", "AMYLASE" in the last 168 hours. No results for input(s): "AMMONIA" in the last 168 hours. CBC: Recent Labs  Lab 01/01/22 1149 01/01/22 1514 01/01/22 1522 01/01/22 2319  WBC 10.9* 9.2  --  7.8  NEUTROABS 8.5*  --    --  6.9  HGB 13.6 13.7 13.9 12.6  HCT 40.9 41.8 41.0 38.7  MCV 96.9 97.2  --  98.5  PLT 212 181  --  209   Cardiac Enzymes: No results for input(s): "CKTOTAL", "CKMB", "CKMBINDEX", "TROPONINI" in the last 168 hours.  BNP (last 3 results) No results for input(s): "BNP" in the last 8760 hours.  ProBNP (last 3 results) Recent Labs    02/12/21 1409  PROBNP 57    CBG: No results for input(s): "GLUCAP" in the last 168 hours.  Radiological Exams on Admission: DG Chest 1 View  Result Date: 01/01/2022 CLINICAL DATA:  COVID positive with shortness of breath. EXAM: CHEST  1 VIEW COMPARISON:  January 01, 2022 FINDINGS: The heart size and mediastinal contours are within normal limits. Surgical sutures are seen within the lateral aspect of the upper right lung. Both lungs are otherwise clear. The visualized skeletal structures are unremarkable. IMPRESSION: Postoperative changes involving the upper right lung, without evidence of acute or active cardiopulmonary disease. Electronically Signed   By: Virgina Norfolk M.D.   On: 01/01/2022 17:40   DG Chest 2 View  Result Date: 01/01/2022 CLINICAL DATA:  Shortness of breath. COVID about a month ago. History of COPD. EXAM: CHEST - 2 VIEW COMPARISON:  Chest two views 12/16/2021 and 11/12/2021; CT chest 10/15/2021 FINDINGS: Cardiac silhouette and mediastinal contours are within normal limits. Postsurgical changes with suture are seen within the superolateral right lung, unchanged from prior. Mild bilateral chronic interstitial thickening is unchanged. No new focal airspace opacity. No pleural effusion or pneumothorax. Mild multilevel degenerative disc changes of the midthoracic spine. IMPRESSION: 1. No acute cardiopulmonary process. 2. Postsurgical changes of the right lung, unchanged from prior. Electronically Signed   By: Yvonne Kendall M.D.   On: 01/01/2022 10:59    EKG: Independently reviewed.   Assessment/Plan Principal Problem:   Pneumonia due to  COVID-19 virus Active Problems:   Severe episode of recurrent major depressive disorder, without psychotic features (HCC)   PAD (peripheral artery disease) (HCC)   Chest pain in adult   Shortness of breath  Chest pain/PAD - Trend troponin - Echocardiogram pending -Most likely secondary to her COVID pneumonia but given her smoking history and lack of ever having her cardiac history evaluated will obtain.   COVID Pneumonia COVID-19 Labs   Recent Labs (last 2 labs)      Recent Labs    01/01/22 1514  DDIMER 0.78*  FERRITIN 141  LDH 184  CRP 8.3*        Recent Labs       Lab Results  Component Value Date    SARSCOV2NAA POSITIVE (A) 01/01/2022    SARSCOV2NAA POSITIVE (A) 11/12/2021    [place] Not Detected 12/31/2019    [place] NEGATIVE 12/04/2018    -Remdesivir per pharmacy protocol - Solu-Medrol 60 mg daily - DuoNeb QID - Mucinex DM BID - Flutter valve - Incentive spirometry - Daily inflammatory markers -Vitamin C 500 mg daily - Zinc 220 mg daily - Patient currently at high risk of requiring intubation given use of accessory muscles and RR 28.   Recurrent severe depressive disorder - Restart home medication  Obesity (BMI 32.43 kg/m) -   Mobility Assessment (last 72 hours)     Mobility Assessment     Row Name 01/01/22 2000 01/01/22 1635         Does patient have an order for bedrest or is patient medically unstable No - Continue assessment No - Continue assessment  What is the highest level of mobility based on the progressive mobility assessment? Level 4 (Walks with assist in room) - Balance while marching in place and cannot step forward and back - Complete Level 5 (Walks with assist in room/hall) - Balance while stepping forward/back and can walk in room with assist - Complete               Code Status: Full (DVT Prophylaxis: Lovenox Family Communication:   Status is: Inpatient    Dispo: The patient is from: Home               Anticipated d/c is to: Home              Anticipated d/c date is: > 3 days              Patient currently is not medically stable to d/c.     Data Reviewed: Care during the described time interval was provided by me .  I have reviewed this patient's available data, including medical history, events of note, physical examination, and all test results as part of my evaluation.   The patient is critically ill with multiple organ systems failure and requires high complexity decision making for assessment and support, frequent evaluation and titration of therapies, application of advanced monitoring technologies and extensive interpretation of multiple databases. Critical Care Time devoted to patient care services described in this note  Time spent: 70 minutes   Syniyah Bourne, [facility]

## 2022-01-02 NOTE — Progress Notes (Signed)
PROGRESS NOTE    Crystal Duarte  HWT:[id number] DOB: July 11, 1960 DOA: 01/01/2022 PCP: [name], MD   Brief Narrative:   [name] PMHx COPD, not on home oxygen, Depression, HLD,  Presents to the emergency department with shortness of breath.  The patient states that she had COVID in early August of this year.  She felt symptomatic and then improved.  She has since developed over the last couple of days congestion with no fevers.  She endorses worsening dyspnea on exertion and has had a productive cough.  Her home nebulizer and inhaler has not helped.  She denies any chest pain.  She endorses persistent wheezing.    Subjective: 10/1 A/O x4, positive SOB, negative CP, positive tachypnea.  Positive anxiety, positive borderline hypotension.  Patient states she wants to be made DNR.,   Assessment & Plan:  Covid vaccination;  Principal Problem:   Pneumonia due to COVID-19 virus Active Problems:   Severe episode of recurrent major depressive disorder, without psychotic features (HCC)   PAD (peripheral artery disease) (HCC)   Chest pain in adult   Shortness of breath   Obesity (BMI 30-39.9)   Acute bronchitis due to Rhinovirus  Chest pain/PAD - Trend troponin - Echocardiogram pending -Most likely secondary to her COVID pneumonia but given her smoking history and lack of ever having her cardiac history evaluated will obtain.  COVID Pneumonia/Rhinovirus Bronchitis COVID-19 Labs  Recent Labs    01/01/22 1514 01/01/22 2319  DDIMER 0.78* 0.49  FERRITIN 141 110  LDH 184 145  CRP 8.3* 6.8*    Lab Results  Component Value Date   SARSCOV2NAA POSITIVE (A) 01/01/2022   SARSCOV2NAA POSITIVE (A) 11/12/2021   [place] Not Detected 12/31/2019   [place] NEGATIVE 12/04/2018  -Remdesivir per pharmacy protocol - Solu-Medrol 60 mg daily - DuoNeb QID - Mucinex DM BID - Flutter valve - Incentive spirometry - Daily inflammatory markers -Vitamin C 500 mg  daily - Zinc 220 mg daily - Patient currently at high risk of requiring intubation given use of accessory muscles and RR 28.  Hypotension - Propranolol 10 mg TID (hold) secondary to hypotension.  Recurrent severe depressive disorder - Restart home medication  Generalized Anxiety DO -10/1 Ativan 1 mg BID -10/1 patient states on propranolol for anxiety see hypotension  Affective Bipolar DO   Obesity (BMI 32.43 kg/m) -    Mobility Assessment (last 72 hours)     Mobility Assessment     Row Name 01/01/22 2000 01/01/22 1635         Does patient have an order for bedrest or is patient medically unstable No - Continue assessment No - Continue assessment      What is the highest level of mobility based on the progressive mobility assessment? Level 4 (Walks with assist in room) - Balance while marching in place and cannot step forward and back - Complete Level 5 (Walks with assist in room/hall) - Balance while stepping forward/back and can walk in room with assist - Complete               DVT prophylaxis: Lovenox Code Status: Full Family Communication: 10/1 spoke with [place] daughter [facility]) on phone went over plan of care answered all questions Status is: Inpatient    Dispo: The patient is from: Home              Anticipated d/c is to: Home              Anticipated d/c  date is: > 3 days              Patient currently is not medically stable to d/c.      Consultants:    Procedures/Significant Events:  9/30 PCXR Postoperative changes involving the upper right lung, without evidence of acute or active cardiopulmonary disease.   I have personally reviewed and interpreted all radiology studies and my findings are as above.  VENTILATOR SETTINGS: Nasal cannula 9/30 Flow 6 L/min SPO2 96% Rate 25   Cultures 9/30 SARS coronavirus positive 9/30 rhinovirus positive 9/30 influenza A/B negative 9/30 hepatitis B surface Ag negative 9/30 HIV screen  negative   Antimicrobials: Anti-infectives (From admission, onward)    Start     Dose/Rate Route Frequency Ordered Stop   01/02/22 1000  remdesivir 100 mg in sodium chloride 0.9 % 100 mL IVPB       See Hyperspace for full Linked Orders Report.   100 mg 200 mL/hr over 30 Minutes Intravenous Daily 01/01/22 1827 01/04/22 0959   01/01/22 2000  remdesivir 200 mg in sodium chloride 0.9% 250 mL IVPB       See Hyperspace for full Linked Orders Report.   200 mg 580 mL/hr over 30 Minutes Intravenous Once 01/01/22 1827 01/01/22 2115         Devices    LINES / TUBES:      Continuous Infusions:  remdesivir 100 mg in sodium chloride 0.9 % 100 mL IVPB 100 mg (01/02/22 1015)     Objective: Vitals:   01/02/22 0600 01/02/22 0700 01/02/22 0800 01/02/22 0816  BP: 128/68 (!) 100/52 (!) 125/50   Pulse: 87 76 70   Resp: '20 16 15   '$ Temp:    98.1 F (36.7 C)  TempSrc:    Oral  SpO2: 100% 100% 100%   Weight:      Height:        Intake/Output Summary (Last 24 hours) at 01/02/2022 1053 Last data filed at 01/02/2022 0300 Gross per 24 hour  Intake 370 ml  Output --  Net 370 ml   Filed Weights   01/01/22 1851  Weight: 85.7 kg    Examination:  General: A/O x4, positive acute respiratory distress Eyes: negative scleral hemorrhage, negative anisocoria, negative icterus ENT: Negative Runny nose, negative gingival bleeding, Neck:  Negative scars, masses, torticollis, lymphadenopathy, JVD Lungs: tachypnea, decreased breath sounds bilaterally, positive expiratory wheezes bilateral, negative crackles.  Positive use of accessory muscles Cardiovascular: Tachycardic, without murmur gallop or rub normal S1 and S2 Abdomen: negative abdominal pain, nondistended, positive soft, bowel sounds, no rebound, no ascites, no appreciable mass Extremities: No significant cyanosis, clubbing, or edema bilateral lower extremities Skin: Negative rashes, lesions, ulcers Psychiatric:  Negative depression,  positive anxiety, negative fatigue, negative mania  Central nervous system:  Cranial nerves II through XII intact, tongue/uvula midline, all extremities muscle strength 5/5, sensation intact throughout, negative dysarthria, negative expressive aphasia, negative receptive aphasia.  .     Data Reviewed: Care during the described time interval was provided by me .  I have reviewed this patient's available data, including medical history, events of note, physical examination, and all test results as part of my evaluation.   CBC: Recent Labs  Lab 01/01/22 1149 01/01/22 1514 01/01/22 1522 01/01/22 2319  WBC 10.9* 9.2  --  7.8  NEUTROABS 8.5*  --   --  6.9  HGB 13.6 13.7 13.9 12.6  HCT 40.9 41.8 41.0 38.7  MCV 96.9 97.2  --  98.5  PLT 212 181  --  213   Basic Metabolic Panel: Recent Labs  Lab 01/01/22 1149 01/01/22 1514 01/01/22 1522 01/01/22 2319  NA 140  --  138 137  K 4.5  --  4.3 4.0  CL 103  --   --  104  CO2 22  --   --  24  GLUCOSE 148*  --   --  177*  BUN 8  --   --  11  CREATININE 0.81 0.77  --  0.66  CALCIUM 9.2  --   --  8.5*  MG  --   --   --  2.2  PHOS  --   --   --  3.9   GFR: Estimated Creatinine Clearance: 77.2 mL/min (by C-G formula based on SCr of 0.66 mg/dL). Liver Function Tests: Recent Labs  Lab 01/01/22 2319  AST 18  ALT 16  ALKPHOS 56  BILITOT 0.6  PROT 6.5  ALBUMIN 3.6   No results for input(s): "LIPASE", "AMYLASE" in the last 168 hours. No results for input(s): "AMMONIA" in the last 168 hours. Coagulation Profile: No results for input(s): "INR", "PROTIME" in the last 168 hours. Cardiac Enzymes: No results for input(s): "CKTOTAL", "CKMB", "CKMBINDEX", "TROPONINI" in the last 168 hours. BNP (last 3 results) Recent Labs    02/12/21 1409  PROBNP 57   HbA1C: No results for input(s): "HGBA1C" in the last 72 hours. CBG: No results for input(s): "GLUCAP" in the last 168 hours. Lipid Profile: No results for input(s): "CHOL", "HDL",  "LDLCALC", "TRIG", "CHOLHDL", "LDLDIRECT" in the last 72 hours. Thyroid Function Tests: No results for input(s): "TSH", "T4TOTAL", "FREET4", "T3FREE", "THYROIDAB" in the last 72 hours. Anemia Panel: Recent Labs    01/01/22 1514 01/01/22 2319  FERRITIN 141 110   Urine analysis:    Component Value Date/Time   COLORURINE YELLOW 10/06/2021 1536   APPEARANCEUR CLEAR 10/06/2021 1536   APPEARANCEUR Clear 05/20/2013 1633   LABSPEC 1.010 10/06/2021 1536   LABSPEC 1.021 05/20/2013 1633   PHURINE 7.0 10/06/2021 1536   GLUCOSEU NEGATIVE 10/06/2021 1536   GLUCOSEU Negative 05/20/2013 1633   HGBUR TRACE (A) 10/06/2021 1536   BILIRUBINUR Neg 10/08/2018 1424   BILIRUBINUR Negative 05/20/2013 1633   KETONESUR NEGATIVE 10/06/2021 1536   PROTEINUR NEGATIVE 10/06/2021 1536   UROBILINOGEN 0.2 10/08/2018 1424   UROBILINOGEN 0.2 09/23/2014 1014   NITRITE Neg 10/08/2018 1424   NITRITE NEGATIVE 11/14/2017 1844   LEUKOCYTESUR Negative 10/08/2018 1424   LEUKOCYTESUR Negative 05/20/2013 1633   Sepsis Labs: '@LABRCNTIP'$ (procalcitonin:4,lacticidven:4)  ) Recent Results (from the past 240 hour(s))  Resp Panel by RT-PCR (Flu A&B, Covid) Anterior Nasal Swab     Status: Abnormal   Collection Time: 01/01/22 10:51 AM   Specimen: Anterior Nasal Swab  Result Value Ref Range Status   SARS Coronavirus 2 by RT PCR POSITIVE (A) NEGATIVE Final    Comment: (NOTE) SARS-CoV-2 target nucleic acids are DETECTED.  The SARS-CoV-2 RNA is generally detectable in upper respiratory specimens during the acute phase of infection. Positive results are indicative of the presence of the identified virus, but do not rule out bacterial infection or co-infection with other pathogens not detected by the test. Clinical correlation with patient history and other diagnostic information is necessary to determine patient infection status. The expected result is Negative.  Fact Sheet for  Patients: EntrepreneurPulse.com.au  Fact Sheet for Healthcare Providers: IncredibleEmployment.be  This test is not yet approved or cleared by the [place] FDA and  has  been authorized for detection and/or diagnosis of SARS-CoV-2 by FDA under an Emergency Use Authorization (EUA).  This EUA will remain in effect (meaning this test can be used) for the duration of  the COVID-19 declaration under Section 564(b)(1) of the A ct, 21 U.S.C. section 360bbb-3(b)(1), unless the authorization is terminated or revoked sooner.     Influenza A by PCR NEGATIVE NEGATIVE Final   Influenza B by PCR NEGATIVE NEGATIVE Final    Comment: (NOTE) The Xpert Xpress SARS-CoV-2/FLU/RSV plus assay is intended as an aid in the diagnosis of influenza from Nasopharyngeal swab specimens and should not be used as a sole basis for treatment. Nasal washings and aspirates are unacceptable for Xpert Xpress SARS-CoV-2/FLU/RSV testing.  Fact Sheet for Patients: EntrepreneurPulse.com.au  Fact Sheet for Healthcare Providers: IncredibleEmployment.be  This test is not yet approved or cleared by the [place] FDA and has been authorized for detection and/or diagnosis of SARS-CoV-2 by FDA under an Emergency Use Authorization (EUA). This EUA will remain in effect (meaning this test can be used) for the duration of the COVID-19 declaration under Section 564(b)(1) of the Act, 21 U.S.C. section 360bbb-3(b)(1), unless the authorization is terminated or revoked.  Performed at [facility], [address], [place], [place] [address]   Culture, blood (Routine X 2) w Reflex to ID Panel     Status: None (Preliminary result)   Collection Time: 01/01/22  4:51 PM   Specimen: BLOOD  Result Value Ref Range Status   Specimen Description   Final    BLOOD LEFT ANTECUBITAL Performed at [facility], [place] [address]  [place], [place] [address]    Special Requests   Final    BOTTLES DRAWN AEROBIC ONLY Blood Culture adequate volume Performed at [facility] [address] [place], [place] [address]    Culture PENDING  Incomplete   Report Status PENDING  Incomplete  Respiratory (~20 pathogens) panel by PCR     Status: Abnormal   Collection Time: 01/01/22  5:31 PM   Specimen: Nasopharyngeal Swab; Respiratory  Result Value Ref Range Status   Adenovirus NOT DETECTED NOT DETECTED Final   Coronavirus 229E NOT DETECTED NOT DETECTED Final    Comment: (NOTE) The Coronavirus on the Respiratory Panel, DOES NOT test for the novel  Coronavirus (2019 nCoV)    Coronavirus HKU1 NOT DETECTED NOT DETECTED Final   Coronavirus NL63 NOT DETECTED NOT DETECTED Final   Coronavirus OC43 NOT DETECTED NOT DETECTED Final   Metapneumovirus NOT DETECTED NOT DETECTED Final   Rhinovirus / Enterovirus DETECTED (A) NOT DETECTED Final   Influenza A NOT DETECTED NOT DETECTED Final   Influenza B NOT DETECTED NOT DETECTED Final   Parainfluenza Virus 1 NOT DETECTED NOT DETECTED Final   Parainfluenza Virus 2 NOT DETECTED NOT DETECTED Final   Parainfluenza Virus 3 NOT DETECTED NOT DETECTED Final   Parainfluenza Virus 4 NOT DETECTED NOT DETECTED Final   Respiratory Syncytial Virus NOT DETECTED NOT DETECTED Final   Bordetella pertussis NOT DETECTED NOT DETECTED Final   Bordetella Parapertussis NOT DETECTED NOT DETECTED Final   Chlamydophila pneumoniae NOT DETECTED NOT DETECTED Final   Mycoplasma pneumoniae NOT DETECTED NOT DETECTED Final    Comment: Performed at [facility], [place]. [address], [place],  [address]  MRSA Next Gen by PCR, Nasal     Status: None   Collection Time: 01/01/22  7:02 PM   Specimen: Nasal Mucosa; Nasal Swab  Result Value Ref Range Status  MRSA by PCR Next Gen NOT DETECTED NOT DETECTED Final    Comment: (NOTE) The GeneXpert MRSA Assay (FDA approved for NASAL specimens only), is one  component of a comprehensive MRSA colonization surveillance program. It is not intended to diagnose MRSA infection nor to guide or monitor treatment for MRSA infections. Test performance is not FDA approved in patients less than 64 years old. Performed at [facility], [place] [address], [place], [place] [address]          Radiology Studies: DG Chest 1 View  Result Date: 01/01/2022 CLINICAL DATA:  COVID positive with shortness of breath. EXAM: CHEST  1 VIEW COMPARISON:  January 01, 2022 FINDINGS: The heart size and mediastinal contours are within normal limits. Surgical sutures are seen within the lateral aspect of the upper right lung. Both lungs are otherwise clear. The visualized skeletal structures are unremarkable. IMPRESSION: Postoperative changes involving the upper right lung, without evidence of acute or active cardiopulmonary disease. Electronically Signed   By: Virgina Norfolk M.D.   On: 01/01/2022 17:40   DG Chest 2 View  Result Date: 01/01/2022 CLINICAL DATA:  Shortness of breath. COVID about a month ago. History of COPD. EXAM: CHEST - 2 VIEW COMPARISON:  Chest two views 12/16/2021 and 11/12/2021; CT chest 10/15/2021 FINDINGS: Cardiac silhouette and mediastinal contours are within normal limits. Postsurgical changes with suture are seen within the superolateral right lung, unchanged from prior. Mild bilateral chronic interstitial thickening is unchanged. No new focal airspace opacity. No pleural effusion or pneumothorax. Mild multilevel degenerative disc changes of the midthoracic spine. IMPRESSION: 1. No acute cardiopulmonary process. 2. Postsurgical changes of the right lung, unchanged from prior. Electronically Signed   By: Yvonne Kendall M.D.   On: 01/01/2022 10:59        Scheduled Meds:  ascorbic acid  500 mg Oral Daily   busPIRone  20 mg Oral TID   Chlorhexidine Gluconate Cloth  6 each Topical Q0600   dextromethorphan-guaiFENesin  1 tablet Oral BID    enoxaparin (LOVENOX) injection  40 mg Subcutaneous Q24H   gabapentin  400 mg Oral BID   gabapentin  600 mg Oral QHS   influenza vac split quadrivalent PF  0.5 mL Intramuscular Tomorrow-1000   ipratropium-albuterol  3 mL Nebulization Q6H   methylPREDNISolone (SOLU-MEDROL) injection  60 mg Intravenous Daily   montelukast  10 mg Oral QHS   pantoprazole  40 mg Oral Daily   traZODone  250 mg Oral QHS   venlafaxine XR  150 mg Oral Q breakfast   venlafaxine XR  75 mg Oral Q breakfast   zinc sulfate  220 mg Oral Daily   Continuous Infusions:  remdesivir 100 mg in sodium chloride 0.9 % 100 mL IVPB 100 mg (01/02/22 1015)     LOS: 1 day   The patient is critically ill with multiple organ systems failure and requires high complexity decision making for assessment and support, frequent evaluation and titration of therapies, application of advanced monitoring technologies and extensive interpretation of multiple databases. Critical Care Time devoted to patient care services described in this note  Time spent: 40 minutes     Samone Guhl, Geraldo Docker, MD [facility]   If 7PM-7AM, please contact night-coverage 01/02/2022, 10:53 AM

## 2022-01-02 NOTE — Progress Notes (Addendum)
    OVERNIGHT PROGRESS REPORT  Notified by RN for patient concern for Code status of DNR/DNI.  Spoke with patient at bedside and reviewed each status of DNR/DNI and Full code.  Patient is aware of each status and is electing to be partial code at this point.  She is aware that she may change this status at any time.   Patient is Partial Code ( will accept short term ventilation if needed.)  Crystal Duarte MSNA MSN [place]

## 2022-01-02 NOTE — H&P (Signed)
[facility] History and Physical  Crystal Duarte Gover XNA:[id number] DOB: 11-03-1960 DOA: 01/01/2022   Referring physician:  PCP: [name], MD    Chief Complaint: SOB   HPI: Crystal Duarte is a 61 y.o. female  PMHx COPD, not on home oxygen, Depression, HLD,   Presents to the emergency department with shortness of breath.  The patient states that she had COVID in early August of this year.  She felt symptomatic and then improved.  She has since developed over the last couple of days congestion with no fevers.  She endorses worsening dyspnea on exertion and has had a productive cough.  Her home nebulizer and inhaler has not helped.  She denies any chest pain.  She endorses persistent wheezing.  A/O x4, positive SOB, positive CP, positive tachypnea   Review of Systems:  Covid vaccination;   Constitutional:  No weight loss, night sweats, Fevers, chills, fatigue.  HEENT:  No headaches, Difficulty swallowing,Tooth/dental problems,Sore throat,  No sneezing, itching, ear ache, nasal congestion, post nasal drip,  Cardio-vascular:  Positive chest pain, negative orthopnea, PND, swelling in lower extremities, anasarca, dizziness, palpitations  GI:  No heartburn, indigestion, abdominal pain, nausea, vomiting, diarrhea, change in bowel habits, loss of appetite  Resp:  Positive shortness of breath with exertion or at rest. No excess mucus, no productive cough, No non-productive cough, No coughing up of blood.No change in color of mucus.Positive wheezing.No chest wall deformity  Skin:  no rash or lesions.  GU:  no dysuria, change in color of urine, no urgency or frequency. No flank pain.  Musculoskeletal:  No joint pain or swelling. No decreased range of motion. No back pain.  Psych:  No change in mood or affect. No depression or anxiety. No memory loss.        Past Medical History:  Diagnosis Date   Coccidioidomycosis, pulmonary (HCC)     COPD (chronic obstructive  pulmonary disease) (HCC)     Depression     Hx of adenomatous polyp of colon 12/04/2014   Major depressive disorder      since 14, electroconvulsive therapy, and transcranial magnetic stimulation recently for 6 weeks every day    Osteopenia 01/2019    T score -2.1 FRAX 9.6% / 2.1% stable from prior DEXA   Panic attack     PONV (postoperative nausea and vomiting)     VAIN I (vaginal intraepithelial neoplasia grade I) 2015    Positive HPV negative subtype 16, 18/45         Past Surgical History:  Procedure Laterality Date   ABDOMINAL HYSTERECTOMY        TAH BSO   BILATERAL VATS ABLATION        vats for biopsy due to infection   BREAST EXCISIONAL BIOPSY Right      benign   CATARACT EXTRACTION       COLONOSCOPY   2001    hemorrhoidectomy   HEMORRHOID SURGERY       LUNG SURGERY   2001    VATS surgery    OVARIAN CYST REMOVAL   1970's   ULNAR NERVE TRANSPOSITION Left 12/06/2018    Procedure: LEFT ULNAR NERVE DECOMPRESSION,  ;  Surgeon: Leanora Cover, MD;  Location: [facility];  Service: Orthopedics;  Laterality: Left;   VESICOVAGINAL FISTULA CLOSURE W/ TAH   2005   WRIST ARTHROSCOPY Left 03/13/2018    Procedure: ARTHROSCOPY LEFT WRIST WITH DEBRIDEMENT;  Surgeon: Leanora Cover, MD;  Location: [facility];  Service: Orthopedics;  Laterality: Left;  block    Social History:  reports that she has been smoking cigarettes. She has a 42.00 pack-year smoking history. She has never used smokeless tobacco. She reports that she does not drink alcohol and does not use drugs.        Allergies  Allergen Reactions   Other Nausea And Vomiting      novacaine     Procaine Nausea And Vomiting   Latuda [Lurasidone Hcl] Anxiety   Sulfa Antibiotics Rash      Only in sunlight            Family History  Problem Relation Age of Onset   Colon polyps Mother     Asthma Mother     Ovarian cancer Mother     Varicose Veins Mother     Colon polyps Father     Heart disease  Father     Peripheral vascular disease Father     Lung cancer Maternal Grandmother     Colon cancer Neg Hx     Esophageal cancer Neg Hx     Stomach cancer Neg Hx     Pancreatic cancer Neg Hx     Liver disease Neg Hx               Prior to Admission medications   Medication Sig Start Date End Date Taking? Authorizing Provider  albuterol (VENTOLIN HFA) 108 (90 Base) MCG/ACT inhaler Inhale 2 puffs into the lungs every 4 (four) hours as needed. 11/12/21   Yes Prince Rome, PA-C  busPIRone (BUSPAR) 10 MG tablet Take two tablets three times daily for anxiety. 05/18/21   Yes Mozingo, Berdie Ogren, NP  Cholecalciferol (VITAMIN D3 PO) Take by mouth.     Yes [provider]  gabapentin (NEURONTIN) 400 MG capsule Take 1 capsule (400 mg total) by mouth 2 (two) times daily. 05/18/21   Yes Mozingo, Berdie Ogren, NP  gabapentin (NEURONTIN) 600 MG tablet Take 1 tablet (600 mg total) by mouth daily. 05/18/21   Yes Mozingo, Berdie Ogren, NP  ipratropium-albuterol (DUONEB) 0.5-2.5 (3) MG/3ML SOLN Take 3 mLs by nebulization every 6 (six) hours as needed. Patient taking differently: Take 3 mLs by nebulization every 6 (six) hours as needed (sob/wheezing). 11/12/21   Yes Prince Rome, PA-C  montelukast (SINGULAIR) 10 MG tablet TAKE ONE TABLET BY MOUTH EVERY NIGHT AT BEDTIME 12/08/21   Yes Martyn Ehrich, NP  Multiple Vitamin (MULTI-VITAMINS) TABS Take 1 tablet by mouth daily.      Yes [provider]  omeprazole (PRILOSEC) 20 MG capsule TAKE ONE CAPSULE BY MOUTH DAILY 12/08/21   Yes Gatha Mayer, MD  propranolol (INDERAL) 10 MG tablet Take 1 tablet (10 mg total) by mouth 3 (three) times daily. 12/13/21   Yes Mozingo, Berdie Ogren, NP  rosuvastatin (CRESTOR) 20 MG tablet Take 1 tablet (20 mg total) by mouth daily. 02/15/21   Yes Chandrasekhar, Mahesh A, MD  traZODone (DESYREL) 100 MG tablet Take 2.5 tablets (250 mg total) by mouth at bedtime. 05/18/21   Yes Mozingo, Berdie Ogren, NP  triamcinolone ointment (KENALOG) 0.5 % Apply 1 Application topically 2 (two) times daily. 10/06/21   Yes Chrzanowski, Jami B, NP  venlafaxine XR (EFFEXOR-XR) 150 MG 24 hr capsule Take 1 capsule (150 mg total) by mouth daily with breakfast. Patient taking differently: Take 150 mg by mouth daily with breakfast. Take along with 37.5 mg tablet=187.5 mg 05/18/21   Yes  Mozingo, Berdie Ogren, NP  venlafaxine XR (EFFEXOR-XR) 37.5 MG 24 hr capsule Take 37.5 mg by mouth daily with breakfast. Take along with 150 mg tablet=187.5 mg     Yes [provider]  venlafaxine XR (EFFEXOR-XR) 75 MG 24 hr capsule Take 1 capsule (75 mg total) by mouth daily. Patient not taking: Reported on 01/01/2022 12/13/21     Mozingo, Berdie Ogren, NP        Consultants:      Procedures/Significant Events:  9/30 PCXR Postoperative changes involving the upper right lung, without evidence of acute or active cardiopulmonary disease.   I have personally reviewed and interpreted all radiology studies and my findings are as above.     VENTILATOR SETTINGS: Nasal cannula 9/30 FiO2 2 L/min Rate: 30BPM SPO2 93%     Cultures 9/30 SARS coronavirus positive 9/30 rhinovirus positive 9/30 influenza A/B negative 9/30 hepatitis B surface Ag negative 9/30 HIV screen negative       Antimicrobials: Anti-infectives (From admission, onward)      Start     Ordered Stop    01/02/22 1000   remdesivir 100 mg in sodium chloride 0.9 % 100 mL IVPB       See Hyperspace for full Linked Orders Report.   01/01/22 1827 01/04/22 0959    01/01/22 2000   remdesivir 200 mg in sodium chloride 0.9% 250 mL IVPB       See Hyperspace for full Linked Orders Report.   01/01/22 1827 01/01/22 2115             Devices                LINES / TUBES:          Continuous Infusions:  remdesivir 100 mg in sodium chloride 0.9 % 100 mL IVPB        Physical Exam:       Vitals:    01/02/22 0500 01/02/22 0600 01/02/22  0700 01/02/22 0816  BP: (!) 111/44 128/68 (!) 100/52    Pulse: 83 87 76    Resp: '16 20 16    '$ Temp:       98.1 F (36.7 C)  TempSrc:       Oral  SpO2: 97% 100% 100%    Weight:          Height:                 Wt Readings from Last 3 Encounters:  01/01/22 85.7 kg  12/16/21 88.5 kg  11/12/21 88 kg      General: A/O x4, positive acute respiratory distress Eyes: negative scleral hemorrhage, negative anisocoria, negative icterus ENT: Negative Runny nose, negative gingival bleeding, Neck:  Negative scars, masses, torticollis, lymphadenopathy, JVD Lungs: tachypnea, decreased breath sounds bilaterally, positive expiratory wheezes bilateral, negative crackles.  Positive use of accessory muscles Cardiovascular: Tachycardic, without murmur gallop or rub normal S1 and S2 Abdomen: negative abdominal pain, nondistended, positive soft, bowel sounds, no rebound, no ascites, no appreciable mass Extremities: No significant cyanosis, clubbing, or edema bilateral lower extremities Skin: Negative rashes, lesions, ulcers Psychiatric:  Negative depression, negative anxiety, negative fatigue, negative mania  Central nervous system:  Cranial nerves II through XII intact, tongue/uvula midline, all extremities muscle strength 5/5, sensation intact throughout, negative dysarthria, negative expressive aphasia, negative receptive aphasia.             Labs on Admission:  Basic Metabolic Panel: Last Labs         Recent  Labs  Lab 01/01/22 1149 01/01/22 1514 01/01/22 1522 01/01/22 2319  NA 140  --  138 137  K 4.5  --  4.3 4.0  CL 103  --   --  104  CO2 22  --   --  24  GLUCOSE 148*  --   --  177*  BUN 8  --   --  11  CREATININE 0.81 0.77  --  0.66  CALCIUM 9.2  --   --  8.5*  MG  --   --   --  2.2  PHOS  --   --   --  3.9      Liver Function Tests: Last Labs      Recent Labs  Lab 01/01/22 2319  AST 18  ALT 16  ALKPHOS 56  BILITOT 0.6  PROT 6.5  ALBUMIN 3.6      Last Labs   No  results for input(s): "LIPASE", "AMYLASE" in the last 168 hours.   Last Labs   No results for input(s): "AMMONIA" in the last 168 hours.   CBC: Last Labs         Recent Labs  Lab 01/01/22 1149 01/01/22 1514 01/01/22 1522 01/01/22 2319  WBC 10.9* 9.2  --  7.8  NEUTROABS 8.5*  --   --  6.9  HGB 13.6 13.7 13.9 12.6  HCT 40.9 41.8 41.0 38.7  MCV 96.9 97.2  --  98.5  PLT 212 181  --  209      Cardiac Enzymes: Last Labs   No results for input(s): "CKTOTAL", "CKMB", "CKMBINDEX", "TROPONINI" in the last 168 hours.     BNP (last 3 results) Recent Labs (within last 365 days)  No results for input(s): "BNP" in the last 8760 hours.     ProBNP (last 3 results) Recent Labs (within last 365 days)     Recent Labs    02/12/21 1409  PROBNP 57        CBG: Last Labs   No results for input(s): "GLUCAP" in the last 168 hours.     Radiological Exams on Admission:  Imaging Results (Last 48 hours)  DG Chest 1 View   Result Date: 01/01/2022 CLINICAL DATA:  COVID positive with shortness of breath. EXAM: CHEST  1 VIEW COMPARISON:  January 01, 2022 FINDINGS: The heart size and mediastinal contours are within normal limits. Surgical sutures are seen within the lateral aspect of the upper right lung. Both lungs are otherwise clear. The visualized skeletal structures are unremarkable. IMPRESSION: Postoperative changes involving the upper right lung, without evidence of acute or active cardiopulmonary disease. Electronically Signed   By: Virgina Norfolk M.D.   On: 01/01/2022 17:40    DG Chest 2 View   Result Date: 01/01/2022 CLINICAL DATA:  Shortness of breath. COVID about a month ago. History of COPD. EXAM: CHEST - 2 VIEW COMPARISON:  Chest two views 12/16/2021 and 11/12/2021; CT chest 10/15/2021 FINDINGS: Cardiac silhouette and mediastinal contours are within normal limits. Postsurgical changes with suture are seen within the superolateral right lung, unchanged from prior. Mild bilateral  chronic interstitial thickening is unchanged. No new focal airspace opacity. No pleural effusion or pneumothorax. Mild multilevel degenerative disc changes of the midthoracic spine. IMPRESSION: 1. No acute cardiopulmonary process. 2. Postsurgical changes of the right lung, unchanged from prior. Electronically Signed   By: Yvonne Kendall M.D.   On: 01/01/2022 10:59       EKG: Independently reviewed.    Assessment/Plan Principal  Problem:   Pneumonia due to COVID-19 virus Active Problems:   Severe episode of recurrent major depressive disorder, without psychotic features (HCC)   PAD (peripheral artery disease) (HCC)   Chest pain in adult   Shortness of breath   Chest pain/PAD - Trend troponin - Echocardiogram pending -Most likely secondary to her COVID pneumonia but given her smoking history and lack of ever having her cardiac history evaluated will obtain.   COVID Pneumonia COVID-19 Labs   Recent Labs (last 2 labs)        Recent Labs    01/01/22 1514  DDIMER 0.78*  FERRITIN 141  LDH 184  CRP 8.3*        Recent Labs           Lab Results  Component Value Date    SARSCOV2NAA POSITIVE (A) 01/01/2022    SARSCOV2NAA POSITIVE (A) 11/12/2021    [place] Not Detected 12/31/2019    [place] NEGATIVE 12/04/2018    -Remdesivir per pharmacy protocol - Solu-Medrol 60 mg daily - DuoNeb QID - Mucinex DM BID - Flutter valve - Incentive spirometry - Daily inflammatory markers -Vitamin C 500 mg daily - Zinc 220 mg daily - Patient currently at high risk of requiring intubation given use of accessory muscles and RR 28.   Recurrent severe depressive disorder - Restart home medication   Obesity (BMI 32.43 kg/m) -      Mobility Assessment (last 72 hours)       Mobility Assessment       Row Name 01/01/22 2000 01/01/22 1635                Does patient have an order for bedrest or is patient medically unstable No - Continue assessment No - Continue assessment           What is the highest level of mobility based on the progressive mobility assessment? Level 4 (Walks with assist in room) - Balance while marching in place and cannot step forward and back - Complete Level 5 (Walks with assist in room/hall) - Balance while stepping forward/back and can walk in room with assist - Complete                        Code Status: Full (DVT Prophylaxis: Lovenox Family Communication:   Status is: Inpatient       Dispo: The patient is from: Home              Anticipated d/c is to: Home              Anticipated d/c date is: > 3 days              Patient currently is not medically stable to d/c.         Data Reviewed: Care during the described time interval was provided by me .  I have reviewed this patient's available data, including medical history, events of note, physical examination, and all test results as part of my evaluation.    The patient is critically ill with multiple organ systems failure and requires high complexity decision making for assessment and support, frequent evaluation and titration of therapies, application of advanced monitoring technologies and extensive interpretation of multiple databases. Critical Care Time devoted to patient care services described in this note  Time spent: 70 minutes     Elanna Bert, [facility]

## 2022-01-03 DIAGNOSIS — J206 Acute bronchitis due to rhinovirus: Secondary | ICD-10-CM | POA: Diagnosis not present

## 2022-01-03 DIAGNOSIS — U071 COVID-19: Secondary | ICD-10-CM | POA: Diagnosis not present

## 2022-01-03 DIAGNOSIS — R079 Chest pain, unspecified: Secondary | ICD-10-CM | POA: Diagnosis not present

## 2022-01-03 DIAGNOSIS — F310 Bipolar disorder, current episode hypomanic: Secondary | ICD-10-CM | POA: Diagnosis not present

## 2022-01-03 DIAGNOSIS — I5031 Acute diastolic (congestive) heart failure: Secondary | ICD-10-CM | POA: Diagnosis present

## 2022-01-03 LAB — GLUCOSE, CAPILLARY
Glucose-Capillary: 132 mg/dL — ABNORMAL HIGH (ref 70–99)
Glucose-Capillary: 113 mg/dL — ABNORMAL HIGH (ref 70–99)
Glucose-Capillary: 231 mg/dL — ABNORMAL HIGH (ref 70–99)
Glucose-Capillary: 192 mg/dL — ABNORMAL HIGH (ref 70–99)
Glucose-Capillary: 126 mg/dL — ABNORMAL HIGH (ref 70–99)
Glucose-Capillary: 102 mg/dL — ABNORMAL HIGH (ref 70–99)

## 2022-01-03 LAB — COMPREHENSIVE METABOLIC PANEL
ALT: 15 U/L (ref 0–44)
AST: 19 U/L (ref 15–41)
Albumin: 3.4 g/dL — ABNORMAL LOW (ref 3.5–5.0)
Alkaline Phosphatase: 53 U/L (ref 38–126)
Anion gap: 5 (ref 5–15)
BUN: 18 mg/dL (ref 8–23)
CO2: 30 mmol/L (ref 22–32)
Calcium: 8.8 mg/dL — ABNORMAL LOW (ref 8.9–10.3)
Chloride: 106 mmol/L (ref 98–111)
Creatinine, Ser: 0.61 mg/dL (ref 0.44–1.00)
GFR, Estimated: >60 mL/min (ref >60)
Glucose, Bld: 134 mg/dL — ABNORMAL HIGH (ref 70–99)
Potassium: 4 mmol/L (ref 3.5–5.1)
Sodium: 141 mmol/L (ref 135–145)
Total Bilirubin: 0.5 mg/dL (ref 0.3–1.2)
Total Protein: 6.2 g/dL — ABNORMAL LOW (ref 6.5–8.1)

## 2022-01-03 LAB — CBC WITH DIFFERENTIAL/PLATELET
Abs Immature Granulocytes: 0.07 10*3/uL (ref 0.00–0.07)
Basophils Absolute: 0 10*3/uL (ref 0.0–0.1)
Basophils Relative: 0 %
Eosinophils Absolute: 0 10*3/uL (ref 0.0–0.5)
Eosinophils Relative: 0 %
HCT: 37.2 % (ref 36.0–46.0)
Hemoglobin: 12.2 g/dL (ref 12.0–15.0)
Immature Granulocytes: 0 %
Lymphocytes Relative: 8 %
Lymphs Abs: 1.4 10*3/uL (ref 0.7–4.0)
MCH: 32.1 pg (ref 26.0–34.0)
MCHC: 32.8 g/dL (ref 30.0–36.0)
MCV: 97.9 fL (ref 80.0–100.0)
Monocytes Absolute: 1.2 10*3/uL — ABNORMAL HIGH (ref 0.1–1.0)
Monocytes Relative: 7 %
Neutro Abs: 14.1 10*3/uL — ABNORMAL HIGH (ref 1.7–7.7)
Neutrophils Relative %: 85 %
Platelets: 224 10*3/uL (ref 150–400)
RBC: 3.8 MIL/uL — ABNORMAL LOW (ref 3.87–5.11)
RDW: 13 % (ref 11.5–15.5)
WBC: 16.8 10*3/uL — ABNORMAL HIGH (ref 4.0–10.5)
nRBC: 0 % (ref 0.0–0.2)

## 2022-01-03 LAB — PHOSPHORUS: Phosphorus: 3.8 mg/dL (ref 2.5–4.6)

## 2022-01-03 LAB — D-DIMER, QUANTITATIVE: D-Dimer, Quant: 0.34 ug/mL-FEU (ref 0.00–0.50)

## 2022-01-03 LAB — HEMOGLOBIN A1C
Hgb A1c MFr Bld: 5.9 % — ABNORMAL HIGH (ref 4.8–5.6)
Mean Plasma Glucose: 122.63 mg/dL

## 2022-01-03 LAB — LACTATE DEHYDROGENASE: LDH: 161 U/L (ref 98–192)

## 2022-01-03 LAB — FERRITIN: Ferritin: 111 ng/mL (ref 11–307)

## 2022-01-03 LAB — C-REACTIVE PROTEIN: CRP: 2.6 mg/dL — ABNORMAL HIGH (ref <1.0)

## 2022-01-03 LAB — MAGNESIUM: Magnesium: 2.3 mg/dL (ref 1.7–2.4)

## 2022-01-03 MED ORDER — LORAZEPAM 2 MG/ML IJ SOLN
1.0000 mg | Freq: Three times a day (TID) | INTRAMUSCULAR | Status: DC
  Administered 2022-01-03 – 2022-01-05 (×7): 1 mg via INTRAVENOUS
  Filled 2022-01-03 (×7): qty 1

## 2022-01-03 MED ORDER — LORATADINE 10 MG PO TABS
10.0000 mg | ORAL_TABLET | Freq: Every day | ORAL | Status: DC
  Administered 2022-01-03 – 2022-01-15 (×13): 10 mg via ORAL
  Filled 2022-01-03 (×13): qty 1

## 2022-01-03 MED ORDER — GUAIFENESIN-DM 100-10 MG/5ML PO SYRP
5.0000 mL | ORAL_SOLUTION | ORAL | Status: DC | PRN
  Administered 2022-01-03 – 2022-01-10 (×7): 5 mL via ORAL
  Filled 2022-01-03 (×7): qty 10

## 2022-01-03 MED ORDER — SALINE SPRAY 0.65 % NA SOLN
1.0000 | NASAL | Status: DC | PRN
  Administered 2022-01-03 – 2022-01-10 (×4): 1 via NASAL
  Filled 2022-01-03: qty 44

## 2022-01-03 NOTE — Progress Notes (Signed)
PROGRESS NOTE    Crystal Duarte  BWG:[id number] DOB: Feb 23, 1961 DOA: 01/01/2022 PCP: [name], MD   Brief Narrative:   [name] PMHx COPD, not on home oxygen, Depression, HLD,  Presents to the emergency department with shortness of breath.  The patient states that she had COVID in early August of this year.  She felt symptomatic and then improved.  She has since developed over the last couple of days congestion with no fevers.  She endorses worsening dyspnea on exertion and has had a productive cough.  Her home nebulizer and inhaler has not helped.  She denies any chest pain.  She endorses persistent wheezing.    Subjective: 10/2 afebrile overnight A/O x4, positive SOB has not been out of bed yet this A.m. decreased anxiety.   Assessment & Plan:  Covid vaccination;  Principal Problem:   Pneumonia due to COVID-19 virus Active Problems:   Affective bipolar disorder ([place])   Severe episode of recurrent major depressive disorder, without psychotic features (HCC)   PAD (peripheral artery disease) (HCC)   Generalized anxiety disorder   Chest pain in adult   Shortness of breath   Obesity (BMI 30-39.9)   Acute bronchitis due to Rhinovirus   Acute diastolic CHF (congestive heart failure) (HCC)  Chest pain/PAD - Trend troponin - Echocardiogram acute diastolic CHF see results below  -Most likely secondary to her COVID pneumonia but given her smoking history and lack of ever having her cardiac history evaluated will obtain.  Acute Diastolic CHF -Strict in and out - Daily weight -10/2 maintain euvolemia as much as possible.  Hypotension - Propranolol 10 mg TID (hold) secondary to hypotension.  COVID Pneumonia/Rhinovirus Bronchitis COVID-19 Labs  Recent Labs    01/01/22 1514 01/01/22 2319 01/02/22 1033 01/03/22 0318  DDIMER 0.78* 0.49  --  0.34  FERRITIN 141 110  --  111  LDH 184 145  --  161  CRP 8.3* 6.8* 5.7* 2.6*    Lab Results  Component  Value Date   SARSCOV2NAA POSITIVE (A) 01/01/2022   SARSCOV2NAA POSITIVE (A) 11/12/2021   [place] Not Detected 12/31/2019   [place] NEGATIVE 12/04/2018  -Remdesivir per pharmacy protocol - Solu-Medrol 60 mg daily - DuoNeb QID - Mucinex DM BID - Flutter valve - Incentive spirometry - Daily inflammatory markers -Vitamin C 500 mg daily - Zinc 220 mg daily - Patient currently at high risk of requiring intubation given use of accessory muscles and RR 28. -10/2 out of bed to chair q shift    Recurrent severe depressive disorder - Restart home medication  Generalized Anxiety DO -BuSpar 20 mg TID - Effexor Exar 225 mg every morning -10/1 Ativan 1 mg BID -10/1 patient states on propranolol for anxiety see hypotension  Affective Bipolar DO -See anxiety disorder  Obesity (BMI 32.43 kg/m) -  Hyperglycemia CBG (last 3)  Recent Labs    01/02/22 2340 01/03/22 0319 01/03/22 0821  GLUCAP 141* 132* 113*  -Moderate SSI     Mobility Assessment (last 72 hours)     Mobility Assessment     Row Name 01/01/22 2000 01/01/22 1635         Does patient have an order for bedrest or is patient medically unstable No - Continue assessment No - Continue assessment      What is the highest level of mobility based on the progressive mobility assessment? Level 4 (Walks with assist in room) - Balance while marching in place and cannot step forward and back -  Complete Level 5 (Walks with assist in room/hall) - Balance while stepping forward/back and can walk in room with assist - Complete               DVT prophylaxis: Lovenox Code Status: Full Family Communication: 10/1 spoke with [place] daughter [facility]) on phone went over plan of care answered all questions Status is: Inpatient    Dispo: The patient is from: Home              Anticipated d/c is to: Home              Anticipated d/c date is: > 3 days              Patient currently is not medically stable to  d/c.      Consultants:    Procedures/Significant Events:  9/30 PCXR Postoperative changes involving the upper right lung, without evidence of acute or active cardiopulmonary disease. 10/1 Echocardiogram Left Ventricle: LVEF= 70 to 75%. The left ventricle has hyperdynamic function.  -Grade I diastolic dysfunction (impaired relaxation).     I have personally reviewed and interpreted all radiology studies and my findings are as above.  VENTILATOR SETTINGS: Nasal cannula 10/2 Flow 4 L/min SPO2 93% Rate 18   Cultures 9/30 SARS coronavirus positive 9/30 rhinovirus positive 9/30 influenza A/B negative 9/30 hepatitis B surface Ag negative 9/30 HIV screen negative   Antimicrobials: Anti-infectives (From admission, onward)    Start     Ordered Stop   01/02/22 1000  remdesivir 100 mg in sodium chloride 0.9 % 100 mL IVPB       See Hyperspace for full Linked Orders Report.   01/01/22 1827 01/04/22 0959   01/01/22 2000  remdesivir 200 mg in sodium chloride 0.9% 250 mL IVPB       See Hyperspace for full Linked Orders Report.   01/01/22 1827 01/01/22 2115         Devices    LINES / TUBES:      Continuous Infusions:     Objective: Vitals:   01/03/22 0336 01/03/22 0400 01/03/22 0500 01/03/22 0818  BP:  (!) 94/56 114/69   Pulse:  81 82   Resp:  16 18   Temp: 97.9 F (36.6 C)   97.6 F (36.4 C)  TempSrc: Oral   Axillary  SpO2:  97% 99% 93%  Weight:      Height:        Intake/Output Summary (Last 24 hours) at 01/03/2022 1131 Last data filed at 01/03/2022 0830 Gross per 24 hour  Intake --  Output 550 ml  Net -550 ml   Filed Weights   01/01/22 1851  Weight: 85.7 kg    Examination:  General: A/O x4, positive acute respiratory distress Eyes: negative scleral hemorrhage, negative anisocoria, negative icterus ENT: Negative Runny nose, negative gingival bleeding, Neck:  Negative scars, masses, torticollis, lymphadenopathy, JVD Lungs: decreased breath  sounds bilaterally, positive expiratory wheezes bilateral (improved from 10/1), negative crackles.  Negative use of accessory muscles Cardiovascular: Regular rhythm and rate without murmur gallop or rub normal S1 and S2 Abdomen: negative abdominal pain, nondistended, positive soft, bowel sounds, no rebound, no ascites, no appreciable mass Extremities: No significant cyanosis, clubbing, or edema bilateral lower extremities Skin: Negative rashes, lesions, ulcers Psychiatric:  Negative depression, negative anxiety, negative fatigue, negative mania  Central nervous system:  Cranial nerves II through XII intact, tongue/uvula midline, all extremities muscle strength 5/5, sensation intact throughout, negative dysarthria, negative expressive aphasia, negative receptive  aphasia.  .     Data Reviewed: Care during the described time interval was provided by me .  I have reviewed this patient's available data, including medical history, events of note, physical examination, and all test results as part of my evaluation.   CBC: Recent Labs  Lab 01/01/22 1149 01/01/22 1514 01/01/22 1522 01/01/22 2319 01/02/22 1033 01/03/22 0318  WBC 10.9* 9.2  --  7.8 11.9* 16.8*  NEUTROABS 8.5*  --   --  6.9 9.9* 14.1*  HGB 13.6 13.7 13.9 12.6 14.1 12.2  HCT 40.9 41.8 41.0 38.7 43.2 37.2  MCV 96.9 97.2  --  98.5 98.0 97.9  PLT 212 181  --  209 232 193   Basic Metabolic Panel: Recent Labs  Lab 01/01/22 1149 01/01/22 1514 01/01/22 1522 01/01/22 2319 01/02/22 1033 01/03/22 0318  NA 140  --  138 137 139 141  K 4.5  --  4.3 4.0 3.9 4.0  CL 103  --   --  104 103 106  CO2 22  --   --  '24 26 30  '$ GLUCOSE 148*  --   --  177* 150* 134*  BUN 8  --   --  '11 13 18  '$ CREATININE 0.81 0.77  --  0.66 0.60 0.61  CALCIUM 9.2  --   --  8.5* 9.2 8.8*  MG  --   --   --  2.2 2.2 2.3  PHOS  --   --   --  3.9 3.4 3.8   GFR: Estimated Creatinine Clearance: 77.2 mL/min (by C-G formula based on SCr of 0.61 mg/dL). Liver  Function Tests: Recent Labs  Lab 01/01/22 2319 01/02/22 1033 01/03/22 0318  AST '18 19 19  '$ ALT '16 17 15  '$ ALKPHOS 56 64 53  BILITOT 0.6 0.6 0.5  PROT 6.5 7.4 6.2*  ALBUMIN 3.6 4.0 3.4*   No results for input(s): "LIPASE", "AMYLASE" in the last 168 hours. No results for input(s): "AMMONIA" in the last 168 hours. Coagulation Profile: No results for input(s): "INR", "PROTIME" in the last 168 hours. Cardiac Enzymes: No results for input(s): "CKTOTAL", "CKMB", "CKMBINDEX", "TROPONINI" in the last 168 hours. BNP (last 3 results) Recent Labs    02/12/21 1409  PROBNP 57   HbA1C: Recent Labs    01/02/22 1033  HGBA1C 5.9*   CBG: Recent Labs  Lab 01/02/22 1945 01/02/22 2340 01/03/22 0319 01/03/22 0821  GLUCAP 174* 141* 132* 113*   Lipid Profile: No results for input(s): "CHOL", "HDL", "LDLCALC", "TRIG", "CHOLHDL", "LDLDIRECT" in the last 72 hours. Thyroid Function Tests: No results for input(s): "TSH", "T4TOTAL", "FREET4", "T3FREE", "THYROIDAB" in the last 72 hours. Anemia Panel: Recent Labs    01/01/22 2319 01/03/22 0318  FERRITIN 110 111   Urine analysis:    Component Value Date/Time   COLORURINE YELLOW 10/06/2021 1536   APPEARANCEUR CLEAR 10/06/2021 1536   APPEARANCEUR Clear 05/20/2013 1633   LABSPEC 1.010 10/06/2021 1536   LABSPEC 1.021 05/20/2013 1633   PHURINE 7.0 10/06/2021 1536   GLUCOSEU NEGATIVE 10/06/2021 1536   GLUCOSEU Negative 05/20/2013 1633   HGBUR TRACE (A) 10/06/2021 1536   BILIRUBINUR Neg 10/08/2018 1424   BILIRUBINUR Negative 05/20/2013 [place] 10/06/2021 1536   PROTEINUR NEGATIVE 10/06/2021 1536   UROBILINOGEN 0.2 10/08/2018 1424   UROBILINOGEN 0.2 09/23/2014 1014   NITRITE Neg 10/08/2018 1424   NITRITE NEGATIVE 11/14/2017 1844   LEUKOCYTESUR Negative 10/08/2018 1424   LEUKOCYTESUR Negative 05/20/2013 1633   Sepsis  Labs: '@LABRCNTIP'$ (procalcitonin:4,lacticidven:4)  ) Recent Results (from the past 240 hour(s))  Resp  Panel by RT-PCR (Flu A&B, Covid) Anterior Nasal Swab     Status: Abnormal   Collection Time: 01/01/22 10:51 AM   Specimen: Anterior Nasal Swab  Result Value Ref Range Status   SARS Coronavirus 2 by RT PCR POSITIVE (A) NEGATIVE Final    Comment: (NOTE) SARS-CoV-2 target nucleic acids are DETECTED.  The SARS-CoV-2 RNA is generally detectable in upper respiratory specimens during the acute phase of infection. Positive results are indicative of the presence of the identified virus, but do not rule out bacterial infection or co-infection with other pathogens not detected by the test. Clinical correlation with patient history and other diagnostic information is necessary to determine patient infection status. The expected result is Negative.  Fact Sheet for Patients: EntrepreneurPulse.com.au  Fact Sheet for Healthcare Providers: IncredibleEmployment.be  This test is not yet approved or cleared by the [place] FDA and  has been authorized for detection and/or diagnosis of SARS-CoV-2 by FDA under an Emergency Use Authorization (EUA).  This EUA will remain in effect (meaning this test can be used) for the duration of  the COVID-19 declaration under Section 564(b)(1) of the A ct, 21 U.S.C. section 360bbb-3(b)(1), unless the authorization is terminated or revoked sooner.     Influenza A by PCR NEGATIVE NEGATIVE Final   Influenza B by PCR NEGATIVE NEGATIVE Final    Comment: (NOTE) The Xpert Xpress SARS-CoV-2/FLU/RSV plus assay is intended as an aid in the diagnosis of influenza from Nasopharyngeal swab specimens and should not be used as a sole basis for treatment. Nasal washings and aspirates are unacceptable for Xpert Xpress SARS-CoV-2/FLU/RSV testing.  Fact Sheet for Patients: EntrepreneurPulse.com.au  Fact Sheet for Healthcare Providers: IncredibleEmployment.be  This test is not yet approved or cleared by  the [place] FDA and has been authorized for detection and/or diagnosis of SARS-CoV-2 by FDA under an Emergency Use Authorization (EUA). This EUA will remain in effect (meaning this test can be used) for the duration of the COVID-19 declaration under Section 564(b)(1) of the Act, 21 U.S.C. section 360bbb-3(b)(1), unless the authorization is terminated or revoked.  Performed at [facility], [address] [place], [place] [address]   Culture, blood (Routine X 2) w Reflex to ID Panel     Status: None (Preliminary result)   Collection Time: 01/01/22  4:51 PM   Specimen: BLOOD  Result Value Ref Range Status   Specimen Description   Final    BLOOD LEFT ANTECUBITAL Performed at [facility], [place] [address], [place], [place] [address]    Special Requests   Final    BOTTLES DRAWN AEROBIC ONLY Blood Culture adequate volume Performed at [facility] [address] [place], [place] [address]    Culture   Final    NO GROWTH 2 DAYS Performed at [facility] [address], [place], [place] [address]    Report Status PENDING  Incomplete  Culture, blood (Routine X 2) w Reflex to ID Panel     Status: None (Preliminary result)   Collection Time: 01/01/22  4:51 PM   Specimen: BLOOD LEFT FOREARM  Result Value Ref Range Status   Specimen Description   Final    BLOOD LEFT FOREARM Performed at [facility] [address], [place], [place] [address]    Special Requests   Final    BOTTLES DRAWN AEROBIC ONLY Blood Culture adequate volume  Performed at [facility], [place] [address] [place], [place] [address]    Culture   Final    NO GROWTH 2 DAYS Performed at [facility] [address], [place], [place] [address]    Report Status PENDING  Incomplete  Respiratory (~20 pathogens) panel by PCR     Status: Abnormal   Collection Time: 01/01/22  5:31 PM   Specimen: Nasopharyngeal Swab;  Respiratory  Result Value Ref Range Status   Adenovirus NOT DETECTED NOT DETECTED Final   Coronavirus 229E NOT DETECTED NOT DETECTED Final    Comment: (NOTE) The Coronavirus on the Respiratory Panel, DOES NOT test for the novel  Coronavirus (2019 nCoV)    Coronavirus HKU1 NOT DETECTED NOT DETECTED Final   Coronavirus NL63 NOT DETECTED NOT DETECTED Final   Coronavirus OC43 NOT DETECTED NOT DETECTED Final   Metapneumovirus NOT DETECTED NOT DETECTED Final   Rhinovirus / Enterovirus DETECTED (A) NOT DETECTED Final   Influenza A NOT DETECTED NOT DETECTED Final   Influenza B NOT DETECTED NOT DETECTED Final   Parainfluenza Virus 1 NOT DETECTED NOT DETECTED Final   Parainfluenza Virus 2 NOT DETECTED NOT DETECTED Final   Parainfluenza Virus 3 NOT DETECTED NOT DETECTED Final   Parainfluenza Virus 4 NOT DETECTED NOT DETECTED Final   Respiratory Syncytial Virus NOT DETECTED NOT DETECTED Final   Bordetella pertussis NOT DETECTED NOT DETECTED Final   Bordetella Parapertussis NOT DETECTED NOT DETECTED Final   Chlamydophila pneumoniae NOT DETECTED NOT DETECTED Final   Mycoplasma pneumoniae NOT DETECTED NOT DETECTED Final    Comment: Performed at [facility], [place]. [address], [place], [place] [address]  MRSA Next Gen by PCR, Nasal     Status: None   Collection Time: 01/01/22  7:02 PM   Specimen: Nasal Mucosa; Nasal Swab  Result Value Ref Range Status   MRSA by PCR Next Gen NOT DETECTED NOT DETECTED Final    Comment: (NOTE) The GeneXpert MRSA Assay (FDA approved for NASAL specimens only), is one component of a comprehensive MRSA colonization surveillance program. It is not intended to diagnose MRSA infection nor to guide or monitor treatment for MRSA infections. Test performance is not FDA approved in patients less than 33 years old. Performed at [facility], [place] [address], [place], [place] [address]   Culture, blood (Routine X 2) w Reflex to ID Panel     Status: None  (Preliminary result)   Collection Time: 01/01/22  7:37 PM   Specimen: BLOOD LEFT HAND  Result Value Ref Range Status   Specimen Description   Final    BLOOD LEFT HAND Performed at [facility] [address], [place], [place] [address]    Special Requests   Final    BOTTLES DRAWN AEROBIC AND ANAEROBIC Blood Culture adequate volume Performed at [facility] [address] [place], [place] [address]    Culture   Final    NO GROWTH < 24 HOURS Performed at [facility] [address] [place], [place] [address]    Report Status PENDING  Incomplete  Culture, blood (Routine X 2) w Reflex to ID Panel     Status: None (Preliminary result)   Collection Time: 01/01/22  7:44 PM   Specimen: BLOOD RIGHT HAND  Result Value Ref Range Status   Specimen Description   Final    BLOOD RIGHT HAND Performed at [facility] [address] [place],  [place] [address]    Special Requests   Final    BOTTLES DRAWN AEROBIC AND ANAEROBIC Blood Culture adequate volume Performed at [facility] [address] [place], [place] [address]    Culture   Final    NO GROWTH < 24 HOURS Performed at [facility] [address], [place], [place] [address]    Report Status PENDING  Incomplete         Radiology Studies: ECHOCARDIOGRAM COMPLETE  Result Date: 01/02/2022    ECHOCARDIOGRAM REPORT   Patient Name:   VALBONA SLABACH Date of Exam: 01/02/2022 Medical Rec #:  [id number]           Height:       64.0 in Accession #:    [id number]          Weight:       188.9 lb Date of Birth:  [birth year]           BSA:          1.910 m Patient Age:    57 years            BP:           100/52 mmHg Patient Gender: F                   HR:           78 bpm. Exam Location:  Inpatient Procedure: 2D Echo, Cardiac Doppler and Color Doppler Indications:    CHF  History:        Patient has prior history of Echocardiogram examinations, most                  recent 03/30/2020. COPD.  Sonographer:    Jefferey Pica Referring Phys: [id number] [place]  1. Left ventricular ejection fraction, by estimation, is 70 to 75%. The left ventricle has hyperdynamic function. The left ventricle has no regional wall motion abnormalities. Left ventricular diastolic parameters are consistent with Grade I diastolic dysfunction (impaired relaxation).  2. Right ventricular systolic function is normal. The right ventricular size is normal. Tricuspid regurgitation signal is inadequate for assessing PA pressure.  3. The mitral valve is grossly normal. Trivial mitral valve regurgitation.  4. The aortic valve is tricuspid. Aortic valve regurgitation is not visualized.  5. The inferior vena cava is normal in size with greater than 50% respiratory variability, suggesting right atrial pressure of 3 mmHg. Comparison(s): Prior images reviewed side by side. LVEF vigorous at 70-75%. FINDINGS  Left Ventricle: Left ventricular ejection fraction, by estimation, is 70 to 75%. The left ventricle has hyperdynamic function. The left ventricle has no regional wall motion abnormalities. The left ventricular internal cavity size was normal in size. There is no left ventricular hypertrophy. Left ventricular diastolic parameters are consistent with Grade I diastolic dysfunction (impaired relaxation). Right Ventricle: The right ventricular size is normal. No increase in right ventricular wall thickness. Right ventricular systolic function is normal. Tricuspid regurgitation signal is inadequate for assessing PA pressure. Left Atrium: Left atrial size was normal in size. Right Atrium: Right atrial size was normal in size. Pericardium: There is no evidence of pericardial effusion. Presence of epicardial fat layer. Mitral Valve: The mitral valve is grossly normal. Trivial mitral valve regurgitation. Tricuspid Valve: The tricuspid valve is grossly normal. Tricuspid valve regurgitation is  trivial. Aortic Valve: The aortic valve is tricuspid. There is mild aortic valve annular calcification. Aortic valve regurgitation is not visualized.  Aortic valve peak gradient measures 7.6 mmHg. Pulmonic Valve: The pulmonic valve was grossly normal. Pulmonic valve regurgitation is not visualized. Aorta: The aortic root is normal in size and structure. Venous: The inferior vena cava is normal in size with greater than 50% respiratory variability, suggesting right atrial pressure of 3 mmHg. IAS/Shunts: No atrial level shunt detected by color flow Doppler.  LEFT VENTRICLE PLAX 2D LVIDd:         4.15 cm   Diastology LVIDs:         2.10 cm   LV e' medial:    5.18 cm/s LV PW:         0.95 cm   LV E/e' medial:  10.9 LV IVS:        0.95 cm   LV e' lateral:   6.96 cm/s LVOT diam:     1.80 cm   LV E/e' lateral: 8.1 LV SV:         69 LV SV Index:   36 LVOT Area:     2.54 cm  RIGHT VENTRICLE             IVC RV Basal diam:  2.70 cm     IVC diam: 2.10 cm RV S prime:     12.60 cm/s TAPSE (M-mode): 2.2 cm LEFT ATRIUM             Index        RIGHT ATRIUM          Index LA diam:        3.20 cm 1.68 cm/m   RA Area:     9.31 cm LA Vol (A2C):   25.9 ml 13.56 ml/m  RA Volume:   18.70 ml 9.79 ml/m LA Vol (A4C):   34.1 ml 17.86 ml/m LA Biplane Vol: 31.6 ml 16.55 ml/m  AORTIC VALVE                 PULMONIC VALVE AV Area (Vmax): 2.52 cm     PV Vmax:       0.92 m/s AV Vmax:        137.50 cm/s  PV Peak grad:  3.4 mmHg AV Peak Grad:   7.6 mmHg LVOT Vmax:      136.00 cm/s LVOT Vmean:     77.300 cm/s LVOT VTI:       0.271 m  AORTA Ao Root diam: 3.00 cm Ao Asc diam:  3.20 cm MITRAL VALVE MV Area (PHT): 4.52 cm    SHUNTS MV Decel Time: 168 msec    Systemic VTI:  0.27 m MV E velocity: 56.30 cm/s  Systemic Diam: 1.80 cm MV A velocity: 77.70 cm/s MV E/A ratio:  0.72 Rozann Lesches MD Electronically signed by Rozann Lesches MD Signature Date/Time: 01/02/2022/11:42:41 AM    Final    DG Chest 1 View  Result Date: 01/01/2022 CLINICAL DATA:   COVID positive with shortness of breath. EXAM: CHEST  1 VIEW COMPARISON:  January 01, 2022 FINDINGS: The heart size and mediastinal contours are within normal limits. Surgical sutures are seen within the lateral aspect of the upper right lung. Both lungs are otherwise clear. The visualized skeletal structures are unremarkable. IMPRESSION: Postoperative changes involving the upper right lung, without evidence of acute or active cardiopulmonary disease. Electronically Signed   By: Virgina Norfolk M.D.   On: 01/01/2022 17:40        Scheduled Meds:  ascorbic acid  500 mg Oral Daily   busPIRone  20 mg  Oral TID   Chlorhexidine Gluconate Cloth  6 each Topical Q0600   dextromethorphan-guaiFENesin  1 tablet Oral BID   enoxaparin (LOVENOX) injection  40 mg Subcutaneous Q24H   gabapentin  400 mg Oral BID   gabapentin  600 mg Oral QHS   influenza vac split quadrivalent PF  0.5 mL Intramuscular Tomorrow-1000   insulin aspart  0-15 Units Subcutaneous Q4H   ipratropium-albuterol  3 mL Nebulization Q6H   LORazepam  1 mg Intravenous BID   methylPREDNISolone (SOLU-MEDROL) injection  60 mg Intravenous Daily   montelukast  10 mg Oral QHS   pantoprazole  40 mg Oral Daily   traZODone  250 mg Oral QHS   venlafaxine XR  150 mg Oral Q breakfast   venlafaxine XR  75 mg Oral Q breakfast   zinc sulfate  220 mg Oral Daily   Continuous Infusions:     LOS: 2 days   The patient is critically ill with multiple organ systems failure and requires high complexity decision making for assessment and support, frequent evaluation and titration of therapies, application of advanced monitoring technologies and extensive interpretation of multiple databases. Critical Care Time devoted to patient care services described in this note  Time spent: 40 minutes     Yolinda Duerr, Geraldo Docker, MD [facility]   If 7PM-7AM, please contact night-coverage 01/03/2022, 11:31 AM

## 2022-01-03 NOTE — Plan of Care (Signed)
  Problem: Clinical Measurements: Goal: Diagnostic test results will improve Outcome: Progressing Goal: Respiratory complications will improve Outcome: Progressing Goal: Cardiovascular complication will be avoided Outcome: Progressing   Problem: Activity: Goal: Risk for activity intolerance will decrease Outcome: Progressing   

## 2022-01-04 ENCOUNTER — Inpatient Hospital Stay (HOSPITAL_COMMUNITY): Payer: Medicare HMO

## 2022-01-04 DIAGNOSIS — J206 Acute bronchitis due to rhinovirus: Secondary | ICD-10-CM | POA: Diagnosis not present

## 2022-01-04 DIAGNOSIS — R079 Chest pain, unspecified: Secondary | ICD-10-CM | POA: Diagnosis not present

## 2022-01-04 DIAGNOSIS — U071 COVID-19: Secondary | ICD-10-CM | POA: Diagnosis not present

## 2022-01-04 DIAGNOSIS — F310 Bipolar disorder, current episode hypomanic: Secondary | ICD-10-CM | POA: Diagnosis not present

## 2022-01-04 DIAGNOSIS — J9601 Acute respiratory failure with hypoxia: Secondary | ICD-10-CM | POA: Diagnosis present

## 2022-01-04 LAB — CBC WITH DIFFERENTIAL/PLATELET
Abs Immature Granulocytes: 0.11 10*3/uL — ABNORMAL HIGH (ref 0.00–0.07)
Basophils Absolute: 0 10*3/uL (ref 0.0–0.1)
Basophils Relative: 0 %
Eosinophils Absolute: 0 10*3/uL (ref 0.0–0.5)
Eosinophils Relative: 0 %
HCT: 37.3 % (ref 36.0–46.0)
Hemoglobin: 11.9 g/dL — ABNORMAL LOW (ref 12.0–15.0)
Immature Granulocytes: 1 %
Lymphocytes Relative: 20 %
Lymphs Abs: 2.8 10*3/uL (ref 0.7–4.0)
MCH: 31.9 pg (ref 26.0–34.0)
MCHC: 31.9 g/dL (ref 30.0–36.0)
MCV: 100 fL (ref 80.0–100.0)
Monocytes Absolute: 1.1 10*3/uL — ABNORMAL HIGH (ref 0.1–1.0)
Monocytes Relative: 8 %
Neutro Abs: 9.6 10*3/uL — ABNORMAL HIGH (ref 1.7–7.7)
Neutrophils Relative %: 71 %
Platelets: 204 10*3/uL (ref 150–400)
RBC: 3.73 MIL/uL — ABNORMAL LOW (ref 3.87–5.11)
RDW: 13.2 % (ref 11.5–15.5)
WBC: 13.6 10*3/uL — ABNORMAL HIGH (ref 4.0–10.5)
nRBC: 0 % (ref 0.0–0.2)

## 2022-01-04 LAB — COMPREHENSIVE METABOLIC PANEL
ALT: 18 U/L (ref 0–44)
AST: 17 U/L (ref 15–41)
Albumin: 3.3 g/dL — ABNORMAL LOW (ref 3.5–5.0)
Alkaline Phosphatase: 50 U/L (ref 38–126)
Anion gap: 6 (ref 5–15)
BUN: 17 mg/dL (ref 8–23)
CO2: 30 mmol/L (ref 22–32)
Calcium: 8.5 mg/dL — ABNORMAL LOW (ref 8.9–10.3)
Chloride: 105 mmol/L (ref 98–111)
Creatinine, Ser: 0.61 mg/dL (ref 0.44–1.00)
GFR, Estimated: >60 mL/min (ref >60)
Glucose, Bld: 99 mg/dL (ref 70–99)
Potassium: 3.8 mmol/L (ref 3.5–5.1)
Sodium: 141 mmol/L (ref 135–145)
Total Bilirubin: 0.4 mg/dL (ref 0.3–1.2)
Total Protein: 5.9 g/dL — ABNORMAL LOW (ref 6.5–8.1)

## 2022-01-04 LAB — GLUCOSE, CAPILLARY
Glucose-Capillary: 106 mg/dL — ABNORMAL HIGH (ref 70–99)
Glucose-Capillary: 135 mg/dL — ABNORMAL HIGH (ref 70–99)
Glucose-Capillary: 141 mg/dL — ABNORMAL HIGH (ref 70–99)
Glucose-Capillary: 150 mg/dL — ABNORMAL HIGH (ref 70–99)
Glucose-Capillary: 188 mg/dL — ABNORMAL HIGH (ref 70–99)
Glucose-Capillary: 97 mg/dL (ref 70–99)

## 2022-01-04 LAB — MAGNESIUM: Magnesium: 2 mg/dL (ref 1.7–2.4)

## 2022-01-04 LAB — PHOSPHORUS: Phosphorus: 3.9 mg/dL (ref 2.5–4.6)

## 2022-01-04 LAB — LACTATE DEHYDROGENASE: LDH: 131 U/L (ref 98–192)

## 2022-01-04 LAB — D-DIMER, QUANTITATIVE: D-Dimer, Quant: 0.52 ug/mL-FEU — ABNORMAL HIGH (ref 0.00–0.50)

## 2022-01-04 LAB — FERRITIN: Ferritin: 101 ng/mL (ref 11–307)

## 2022-01-04 MED ORDER — PROPRANOLOL HCL 20 MG PO TABS
10.0000 mg | ORAL_TABLET | Freq: Three times a day (TID) | ORAL | Status: DC
  Administered 2022-01-04 – 2022-01-09 (×16): 10 mg via ORAL
  Filled 2022-01-04 (×15): qty 1
  Filled 2022-01-04: qty 0.5

## 2022-01-04 NOTE — Progress Notes (Addendum)
PROGRESS NOTE    Crystal Duarte  TIR:[id number] DOB: 1961/03/05 DOA: 01/01/2022 PCP: [name], MD   Brief Narrative:   [name] PMHx COPD, not on home oxygen, Depression, HLD,  Presents to the emergency department with shortness of breath.  The patient states that she had COVID in early August of this year.  She felt symptomatic and then improved.  She has since developed over the last couple of days congestion with no fevers.  She endorses worsening dyspnea on exertion and has had a productive cough.  Her home nebulizer and inhaler has not helped.  She denies any chest pain.  She endorses persistent wheezing.    Subjective: 10/3 afebrile overnight but became hypertensive.  A/O x4, patient speaking in full sentences still extremely anxious.  Now wants chest CT because her grandmother had lung cancer, and I believe it was her aunt who had ovarian cancer.  Because she does not believe she is getting better thinks we will find something.   Assessment & Plan:  Covid vaccination;  Principal Problem:   Pneumonia due to COVID-19 virus Active Problems:   Affective bipolar disorder ([place])   Severe episode of recurrent major depressive disorder, without psychotic features (HCC)   PAD (peripheral artery disease) (HCC)   Generalized anxiety disorder   Chest pain in adult   Shortness of breath   Obesity (BMI 30-39.9)   Acute bronchitis due to Rhinovirus   Acute diastolic CHF (congestive heart failure) (HCC)  Chest pain/PAD - Trend troponin - Echocardiogram acute diastolic CHF see results below  -Most likely secondary to her COVID pneumonia but given her smoking history and lack of ever having her cardiac history evaluated will obtain. -10/3 resolved  Acute Diastolic CHF -Strict in and out - Daily weight -10/2 maintain euvolemia as much as possible.  Hypotension/Essential HTN -10/2 patient now HTN - 10/2 Propranolol 10 mg TID .  Acute respiratory failure with  hypoxia - See COVID-pneumonia  COVID Pneumonia/Rhinovirus Bronchitis COVID-19 Labs  Recent Labs    01/01/22 2319 01/02/22 1033 01/03/22 0318 01/04/22 0639  DDIMER 0.49  --  0.34 0.52*  FERRITIN 110  --  111 101  LDH 145  --  161 131  CRP 6.8* 5.7* 2.6*  --      Lab Results  Component Value Date   SARSCOV2NAA POSITIVE (A) 01/01/2022   SARSCOV2NAA POSITIVE (A) 11/12/2021   [place] Not Detected 12/31/2019   [place] NEGATIVE 12/04/2018  -Remdesivir per pharmacy protocol - Solu-Medrol 60 mg daily - DuoNeb QID - Mucinex DM BID - Flutter valve - Incentive spirometry - Daily inflammatory markers -Vitamin C 500 mg daily - Zinc 220 mg daily - Patient currently at high risk of requiring intubation given use of accessory muscles and RR 28. -10/2 out of bed to chair q shift  -10/3 patient's SOB improved even though she does not believe so.  Able to speak in full sentences now on 2 L O2 via [place] when sitting still, requires increased O2 when active which is what bothers patient the most..   Recurrent severe depressive disorder - See anxiety DO  Generalized Anxiety DO -BuSpar 20 mg TID - Effexor Exar 225 mg every morning -10/1 Ativan 1 mg BID -10/1 patient states on propranolol for anxiety -10/3 restart propranolol  Affective Bipolar DO -See anxiety disorder  Obesity (BMI 32.43 kg/m) -  Hyperglycemia CBG (last 3)  Recent Labs    01/03/22 2320 01/04/22 0317 01/04/22 0753  GLUCAP 102* 106* 97   -  Moderate SSI    Goals of care - 10/3 spoke at length with [place] daughter concerning continuing care after patient discharge.  Patient lives alone and given the seriousness of her current illness will not be able to take care of herself.  Please ensure that she is kept in the loop concerning SNF placement.  Have not consulted PT/OT yet as patient's respiratory status just now starting to improve.      Mobility Assessment (last 72 hours)     Mobility  Assessment     Row Name 01/01/22 2000 01/01/22 1635         Does patient have an order for bedrest or is patient medically unstable No - Continue assessment No - Continue assessment      What is the highest level of mobility based on the progressive mobility assessment? Level 4 (Walks with assist in room) - Balance while marching in place and cannot step forward and back - Complete Level 5 (Walks with assist in room/hall) - Balance while stepping forward/back and can walk in room with assist - Complete               DVT prophylaxis: Lovenox Code Status: Full Family Communication: 10/3 spoke with [place] daughter [facility]) on phone went over plan of care answered all questions Status is: Inpatient    Dispo: The patient is from: Home              Anticipated d/c is to: Home              Anticipated d/c date is: > 3 days              Patient currently is not medically stable to d/c.      Consultants:    Procedures/Significant Events:  9/30 PCXR Postoperative changes involving the upper right lung, without evidence of acute or active cardiopulmonary disease. 10/1 Echocardiogram Left Ventricle: LVEF= 70 to 75%. The left ventricle has hyperdynamic function.  -Grade I diastolic dysfunction (impaired relaxation).  10/3 PCXR: No active disease   I have personally reviewed and interpreted all radiology studies and my findings are as above.  VENTILATOR SETTINGS: Nasal cannula 10/2 Flow 3 L/min SPO2 90% Rate 20   Cultures 9/30 SARS coronavirus positive 9/30 rhinovirus positive 9/30 influenza A/B negative 9/30 hepatitis B surface Ag negative 9/30 HIV screen negative   Antimicrobials: Anti-infectives (From admission, onward)    Start     Ordered Stop   01/02/22 1000  remdesivir 100 mg in sodium chloride 0.9 % 100 mL IVPB       See Hyperspace for full Linked Orders Report.   01/01/22 1827 01/04/22 0959   01/01/22 2000  remdesivir 200 mg in sodium chloride 0.9% 250  mL IVPB       See Hyperspace for full Linked Orders Report.   01/01/22 1827 01/01/22 2115         Devices    LINES / TUBES:      Continuous Infusions:     Objective: Vitals:   01/04/22 0654 01/04/22 0700 01/04/22 0800 01/04/22 0900  BP:  (!) 146/70 (!) 172/113 (!) 181/84  Pulse:  86 86 85  Resp:  '18 19 20  '$ Temp: 98.4 F (36.9 C)     TempSrc: Oral     SpO2:  95% 95% 90%  Weight: 90 kg     Height:        Intake/Output Summary (Last 24 hours) at 01/04/2022 9528 Last data filed at  01/04/2022 0810 Gross per 24 hour  Intake 240 ml  Output 1200 ml  Net -960 ml    Filed Weights   01/01/22 1851 01/04/22 0654  Weight: 85.7 kg 90 kg    Examination:  General: A/O x4, positive acute respiratory distress Eyes: negative scleral hemorrhage, negative anisocoria, negative icterus ENT: Negative Runny nose, negative gingival bleeding, Neck:  Negative scars, masses, torticollis, lymphadenopathy, JVD Lungs: decreased breath sounds bilaterally, positive expiratory wheezes bilateral (improved from 10/2), negative crackles.  Negative use of accessory muscles Cardiovascular: Regular rhythm and rate without murmur gallop or rub normal S1 and S2 Abdomen: negative abdominal pain, nondistended, positive soft, bowel sounds, no rebound, no ascites, no appreciable mass Extremities: No significant cyanosis, clubbing, or edema bilateral lower extremities Skin: Negative rashes, lesions, ulcers Psychiatric:  Negative depression, negative anxiety, negative fatigue, negative mania  Central nervous system:  Cranial nerves II through XII intact, tongue/uvula midline, all extremities muscle strength 5/5, sensation intact throughout, negative dysarthria, negative expressive aphasia, negative receptive aphasia.  .     Data Reviewed: Care during the described time interval was provided by me .  I have reviewed this patient's available data, including medical history, events of note, physical  examination, and all test results as part of my evaluation.   CBC: Recent Labs  Lab 01/01/22 1149 01/01/22 1514 01/01/22 1522 01/01/22 2319 01/02/22 1033 01/03/22 0318 01/04/22 0639  WBC 10.9* 9.2  --  7.8 11.9* 16.8* 13.6*  NEUTROABS 8.5*  --   --  6.9 9.9* 14.1* 9.6*  HGB 13.6 13.7 13.9 12.6 14.1 12.2 11.9*  HCT 40.9 41.8 41.0 38.7 43.2 37.2 37.3  MCV 96.9 97.2  --  98.5 98.0 97.9 100.0  PLT 212 181  --  209 232 224 355    Basic Metabolic Panel: Recent Labs  Lab 01/01/22 1149 01/01/22 1514 01/01/22 1522 01/01/22 2319 01/02/22 1033 01/03/22 0318 01/04/22 0639  NA 140  --  138 137 139 141 141  K 4.5  --  4.3 4.0 3.9 4.0 3.8  CL 103  --   --  104 103 106 105  CO2 22  --   --  '24 26 30 30  '$ GLUCOSE 148*  --   --  177* 150* 134* 99  BUN 8  --   --  '11 13 18 17  '$ CREATININE 0.81 0.77  --  0.66 0.60 0.61 0.61  CALCIUM 9.2  --   --  8.5* 9.2 8.8* 8.5*  MG  --   --   --  2.2 2.2 2.3 2.0  PHOS  --   --   --  3.9 3.4 3.8 3.9    GFR: Estimated Creatinine Clearance: 79.2 mL/min (by C-G formula based on SCr of 0.61 mg/dL). Liver Function Tests: Recent Labs  Lab 01/01/22 2319 01/02/22 1033 01/03/22 0318 01/04/22 0639  AST '18 19 19 17  '$ ALT '16 17 15 18  '$ ALKPHOS 56 64 53 50  BILITOT 0.6 0.6 0.5 0.4  PROT 6.5 7.4 6.2* 5.9*  ALBUMIN 3.6 4.0 3.4* 3.3*    No results for input(s): "LIPASE", "AMYLASE" in the last 168 hours. No results for input(s): "AMMONIA" in the last 168 hours. Coagulation Profile: No results for input(s): "INR", "PROTIME" in the last 168 hours. Cardiac Enzymes: No results for input(s): "CKTOTAL", "CKMB", "CKMBINDEX", "TROPONINI" in the last 168 hours. BNP (last 3 results) Recent Labs    02/12/21 1409  PROBNP 57    HbA1C: Recent Labs  01/02/22 1033  HGBA1C 5.9*    CBG: Recent Labs  Lab 01/03/22 1509 01/03/22 2023 01/03/22 2320 01/04/22 0317 01/04/22 0753  GLUCAP 192* 126* 102* 106* 97    Lipid Profile: No results for input(s):  "CHOL", "HDL", "LDLCALC", "TRIG", "CHOLHDL", "LDLDIRECT" in the last 72 hours. Thyroid Function Tests: No results for input(s): "TSH", "T4TOTAL", "FREET4", "T3FREE", "THYROIDAB" in the last 72 hours. Anemia Panel: Recent Labs    01/03/22 0318 01/04/22 0639  FERRITIN 111 101    Urine analysis:    Component Value Date/Time   COLORURINE YELLOW 10/06/2021 1536   APPEARANCEUR CLEAR 10/06/2021 1536   APPEARANCEUR Clear 05/20/2013 1633   LABSPEC 1.010 10/06/2021 1536   LABSPEC 1.021 05/20/2013 1633   PHURINE 7.0 10/06/2021 1536   GLUCOSEU NEGATIVE 10/06/2021 1536   GLUCOSEU Negative 05/20/2013 1633   HGBUR TRACE (A) 10/06/2021 1536   BILIRUBINUR Neg 10/08/2018 1424   BILIRUBINUR Negative 05/20/2013 1633   KETONESUR NEGATIVE 10/06/2021 1536   PROTEINUR NEGATIVE 10/06/2021 1536   UROBILINOGEN 0.2 10/08/2018 1424   UROBILINOGEN 0.2 09/23/2014 1014   NITRITE Neg 10/08/2018 1424   NITRITE NEGATIVE 11/14/2017 1844   LEUKOCYTESUR Negative 10/08/2018 1424   LEUKOCYTESUR Negative 05/20/2013 1633   Sepsis Labs: '@LABRCNTIP'$ (procalcitonin:4,lacticidven:4)  ) Recent Results (from the past 240 hour(s))  Resp Panel by RT-PCR (Flu A&B, Covid) Anterior Nasal Swab     Status: Abnormal   Collection Time: 01/01/22 10:51 AM   Specimen: Anterior Nasal Swab  Result Value Ref Range Status   SARS Coronavirus 2 by RT PCR POSITIVE (A) NEGATIVE Final    Comment: (NOTE) SARS-CoV-2 target nucleic acids are DETECTED.  The SARS-CoV-2 RNA is generally detectable in upper respiratory specimens during the acute phase of infection. Positive results are indicative of the presence of the identified virus, but do not rule out bacterial infection or co-infection with other pathogens not detected by the test. Clinical correlation with patient history and other diagnostic information is necessary to determine patient infection status. The expected result is Negative.  Fact Sheet for  Patients: EntrepreneurPulse.com.au  Fact Sheet for Healthcare Providers: IncredibleEmployment.be  This test is not yet approved or cleared by the [place] FDA and  has been authorized for detection and/or diagnosis of SARS-CoV-2 by FDA under an Emergency Use Authorization (EUA).  This EUA will remain in effect (meaning this test can be used) for the duration of  the COVID-19 declaration under Section 564(b)(1) of the A ct, 21 U.S.C. section 360bbb-3(b)(1), unless the authorization is terminated or revoked sooner.     Influenza A by PCR NEGATIVE NEGATIVE Final   Influenza B by PCR NEGATIVE NEGATIVE Final    Comment: (NOTE) The Xpert Xpress SARS-CoV-2/FLU/RSV plus assay is intended as an aid in the diagnosis of influenza from Nasopharyngeal swab specimens and should not be used as a sole basis for treatment. Nasal washings and aspirates are unacceptable for Xpert Xpress SARS-CoV-2/FLU/RSV testing.  Fact Sheet for Patients: EntrepreneurPulse.com.au  Fact Sheet for Healthcare Providers: IncredibleEmployment.be  This test is not yet approved or cleared by the [place] FDA and has been authorized for detection and/or diagnosis of SARS-CoV-2 by FDA under an Emergency Use Authorization (EUA). This EUA will remain in effect (meaning this test can be used) for the duration of the COVID-19 declaration under Section 564(b)(1) of the Act, 21 U.S.C. section 360bbb-3(b)(1), unless the authorization is terminated or revoked.  Performed at [facility], [address] [place], [place] [address]   Culture, blood (  Routine X 2) w Reflex to ID Panel     Status: None (Preliminary result)   Collection Time: 01/01/22  4:51 PM   Specimen: BLOOD  Result Value Ref Range Status   Specimen Description   Final    BLOOD LEFT ANTECUBITAL Performed at [facility], [place] [address],  [place], [place] [address]    Special Requests   Final    BOTTLES DRAWN AEROBIC ONLY Blood Culture adequate volume Performed at [facility] [address], [place], [place] [address]    Culture   Final    NO GROWTH 3 DAYS Performed at [facility], [place] [address], [place], [place] [address]    Report Status PENDING  Incomplete  Culture, blood (Routine X 2) w Reflex to ID Panel     Status: None (Preliminary result)   Collection Time: 01/01/22  4:51 PM   Specimen: BLOOD LEFT FOREARM  Result Value Ref Range Status   Specimen Description   Final    BLOOD LEFT FOREARM Performed at [facility] [address], [place], [place] [address]    Special Requests   Final    BOTTLES DRAWN AEROBIC ONLY Blood Culture adequate volume Performed at [facility] [address] [place], [place] [address]    Culture   Final    NO GROWTH 3 DAYS Performed at [facility], [place] [address] [place], [place] [address]    Report Status PENDING  Incomplete  Respiratory (~20 pathogens) panel by PCR     Status: Abnormal   Collection Time: 01/01/22  5:31 PM   Specimen: Nasopharyngeal Swab; Respiratory  Result Value Ref Range Status   Adenovirus NOT DETECTED NOT DETECTED Final   Coronavirus 229E NOT DETECTED NOT DETECTED Final    Comment: (NOTE) The Coronavirus on the Respiratory Panel, DOES NOT test for the novel  Coronavirus (2019 nCoV)    Coronavirus HKU1 NOT DETECTED NOT DETECTED Final   Coronavirus NL63 NOT DETECTED NOT DETECTED Final   Coronavirus OC43 NOT DETECTED NOT DETECTED Final   Metapneumovirus NOT DETECTED NOT DETECTED Final   Rhinovirus / Enterovirus DETECTED (A) NOT DETECTED Final   Influenza A NOT DETECTED NOT DETECTED Final   Influenza B NOT DETECTED NOT DETECTED Final   Parainfluenza Virus 1 NOT DETECTED NOT DETECTED Final   Parainfluenza Virus 2 NOT DETECTED NOT DETECTED Final   Parainfluenza Virus 3 NOT DETECTED NOT  DETECTED Final   Parainfluenza Virus 4 NOT DETECTED NOT DETECTED Final   Respiratory Syncytial Virus NOT DETECTED NOT DETECTED Final   Bordetella pertussis NOT DETECTED NOT DETECTED Final   Bordetella Parapertussis NOT DETECTED NOT DETECTED Final   Chlamydophila pneumoniae NOT DETECTED NOT DETECTED Final   Mycoplasma pneumoniae NOT DETECTED NOT DETECTED Final    Comment: Performed at [facility], [place]. [address], [place], [place] [address]  MRSA Next Gen by PCR, Nasal     Status: None   Collection Time: 01/01/22  7:02 PM   Specimen: Nasal Mucosa; Nasal Swab  Result Value Ref Range Status   MRSA by PCR Next Gen NOT DETECTED NOT DETECTED Final    Comment: (NOTE) The GeneXpert MRSA Assay (FDA approved for NASAL specimens only), is one component of a comprehensive MRSA colonization surveillance program. It is not intended to diagnose MRSA infection nor to guide or monitor treatment for MRSA infections. Test performance is not FDA approved in patients less than 48 years old.  Performed at [facility], [place] [address], [place], [place] [address]   Culture, blood (Routine X 2) w Reflex to ID Panel     Status: None (Preliminary result)   Collection Time: 01/01/22  7:37 PM   Specimen: BLOOD LEFT HAND  Result Value Ref Range Status   Specimen Description   Final    BLOOD LEFT HAND Performed at [facility] [address] [place], [place] [address]    Special Requests   Final    BOTTLES DRAWN AEROBIC AND ANAEROBIC Blood Culture adequate volume Performed at [facility] [address], [place], [place] [address]    Culture   Final    NO GROWTH 3 DAYS Performed at [facility], [place] [address] [place], [place] [address]    Report Status PENDING  Incomplete  Culture, blood (Routine X 2) w Reflex to ID Panel     Status: None (Preliminary result)   Collection Time: 01/01/22  7:44 PM   Specimen: BLOOD RIGHT HAND  Result  Value Ref Range Status   Specimen Description   Final    BLOOD RIGHT HAND Performed at [facility] [address], [place], [place] [address]    Special Requests   Final    BOTTLES DRAWN AEROBIC AND ANAEROBIC Blood Culture adequate volume Performed at [facility] [address] [place], [place] [address]    Culture   Final    NO GROWTH 3 DAYS Performed at [facility], [place] [address] [place], [place] [address]    Report Status PENDING  Incomplete         Radiology Studies: DG CHEST PORT 1 VIEW  Result Date: 01/04/2022 CLINICAL DATA:  [id number].  COVID pneumonia. EXAM: PORTABLE CHEST 1 VIEW COMPARISON:  Portable chest 01/01/2022 FINDINGS: The heart size and mediastinal contours are within normal limits. Both lungs are clear. The visualized skeletal structures are unremarkable. There are multiple overlying monitor wires. IMPRESSION: No active disease. Electronically Signed   By: Telford Nab M.D.   On: 01/04/2022 07:20        Scheduled Meds:  ascorbic acid  500 mg Oral Daily   busPIRone  20 mg Oral TID   Chlorhexidine Gluconate Cloth  6 each Topical Q0600   dextromethorphan-guaiFENesin  1 tablet Oral BID   enoxaparin (LOVENOX) injection  40 mg Subcutaneous Q24H   gabapentin  400 mg Oral BID   gabapentin  600 mg Oral QHS   influenza vac split quadrivalent PF  0.5 mL Intramuscular Tomorrow-1000   insulin aspart  0-15 Units Subcutaneous Q4H   ipratropium-albuterol  3 mL Nebulization Q6H   loratadine  10 mg Oral Daily   LORazepam  1 mg Intravenous TID   methylPREDNISolone (SOLU-MEDROL) injection  60 mg Intravenous Daily   montelukast  10 mg Oral QHS   pantoprazole  40 mg Oral Daily   traZODone  250 mg Oral QHS   venlafaxine XR  150 mg Oral Q breakfast   venlafaxine XR  75 mg Oral Q breakfast   zinc sulfate  220 mg Oral Daily   Continuous Infusions:     LOS: 3 days   The patient is critically ill with multiple organ  systems failure and requires high complexity decision making for assessment and support, frequent evaluation and titration of therapies, application of advanced monitoring technologies and extensive interpretation of multiple databases. Critical Care Time devoted to patient care services described in  this note  Time spent: 40 minutes     Gill Delrossi, Geraldo Docker, MD [facility]   If 7PM-7AM, please contact night-coverage 01/04/2022, 9:07 AM

## 2022-01-04 NOTE — Progress Notes (Signed)
Pt stable at time of transfer. Report given. Belongings collected & given to pt.

## 2022-01-05 ENCOUNTER — Inpatient Hospital Stay (HOSPITAL_COMMUNITY): Payer: Medicare HMO

## 2022-01-05 DIAGNOSIS — J441 Chronic obstructive pulmonary disease with (acute) exacerbation: Secondary | ICD-10-CM

## 2022-01-05 DIAGNOSIS — J1282 Pneumonia due to coronavirus disease 2019: Secondary | ICD-10-CM | POA: Diagnosis not present

## 2022-01-05 DIAGNOSIS — U071 COVID-19: Principal | ICD-10-CM

## 2022-01-05 DIAGNOSIS — I5031 Acute diastolic (congestive) heart failure: Secondary | ICD-10-CM | POA: Diagnosis not present

## 2022-01-05 DIAGNOSIS — J206 Acute bronchitis due to rhinovirus: Secondary | ICD-10-CM | POA: Diagnosis not present

## 2022-01-05 DIAGNOSIS — F310 Bipolar disorder, current episode hypomanic: Secondary | ICD-10-CM | POA: Diagnosis not present

## 2022-01-05 LAB — CBC WITH DIFFERENTIAL/PLATELET
Abs Immature Granulocytes: 0.11 10*3/uL — ABNORMAL HIGH (ref 0.00–0.07)
Basophils Absolute: 0 10*3/uL (ref 0.0–0.1)
Basophils Relative: 0 %
Eosinophils Absolute: 0.1 10*3/uL (ref 0.0–0.5)
Eosinophils Relative: 1 %
HCT: 40 % (ref 36.0–46.0)
Hemoglobin: 12.9 g/dL (ref 12.0–15.0)
Immature Granulocytes: 1 %
Lymphocytes Relative: 27 %
Lymphs Abs: 3.7 10*3/uL (ref 0.7–4.0)
MCH: 32 pg (ref 26.0–34.0)
MCHC: 32.3 g/dL (ref 30.0–36.0)
MCV: 99.3 fL (ref 80.0–100.0)
Monocytes Absolute: 1.3 10*3/uL — ABNORMAL HIGH (ref 0.1–1.0)
Monocytes Relative: 9 %
Neutro Abs: 8.7 10*3/uL — ABNORMAL HIGH (ref 1.7–7.7)
Neutrophils Relative %: 62 %
Platelets: 226 10*3/uL (ref 150–400)
RBC: 4.03 MIL/uL (ref 3.87–5.11)
RDW: 12.8 % (ref 11.5–15.5)
WBC: 14 10*3/uL — ABNORMAL HIGH (ref 4.0–10.5)
nRBC: 0 % (ref 0.0–0.2)

## 2022-01-05 LAB — COMPREHENSIVE METABOLIC PANEL
ALT: 19 U/L (ref 0–44)
AST: 20 U/L (ref 15–41)
Albumin: 3.3 g/dL — ABNORMAL LOW (ref 3.5–5.0)
Alkaline Phosphatase: 50 U/L (ref 38–126)
Anion gap: 8 (ref 5–15)
BUN: 16 mg/dL (ref 8–23)
CO2: 33 mmol/L — ABNORMAL HIGH (ref 22–32)
Calcium: 8.4 mg/dL — ABNORMAL LOW (ref 8.9–10.3)
Chloride: 98 mmol/L (ref 98–111)
Creatinine, Ser: 0.69 mg/dL (ref 0.44–1.00)
GFR, Estimated: >60 mL/min (ref >60)
Glucose, Bld: 119 mg/dL — ABNORMAL HIGH (ref 70–99)
Potassium: 3.5 mmol/L (ref 3.5–5.1)
Sodium: 139 mmol/L (ref 135–145)
Total Bilirubin: 0.4 mg/dL (ref 0.3–1.2)
Total Protein: 6 g/dL — ABNORMAL LOW (ref 6.5–8.1)

## 2022-01-05 LAB — GLUCOSE, CAPILLARY
Glucose-Capillary: 107 mg/dL — ABNORMAL HIGH (ref 70–99)
Glucose-Capillary: 116 mg/dL — ABNORMAL HIGH (ref 70–99)
Glucose-Capillary: 126 mg/dL — ABNORMAL HIGH (ref 70–99)
Glucose-Capillary: 164 mg/dL — ABNORMAL HIGH (ref 70–99)
Glucose-Capillary: 277 mg/dL — ABNORMAL HIGH (ref 70–99)
Glucose-Capillary: 284 mg/dL — ABNORMAL HIGH (ref 70–99)
Glucose-Capillary: 96 mg/dL (ref 70–99)

## 2022-01-05 LAB — PHOSPHORUS: Phosphorus: 4 mg/dL (ref 2.5–4.6)

## 2022-01-05 LAB — C-REACTIVE PROTEIN: CRP: 0.9 mg/dL (ref <1.0)

## 2022-01-05 LAB — MAGNESIUM: Magnesium: 2.2 mg/dL (ref 1.7–2.4)

## 2022-01-05 LAB — D-DIMER, QUANTITATIVE: D-Dimer, Quant: 0.48 ug/mL-FEU (ref 0.00–0.50)

## 2022-01-05 LAB — FERRITIN: Ferritin: 87 ng/mL (ref 11–307)

## 2022-01-05 LAB — LACTATE DEHYDROGENASE: LDH: 141 U/L (ref 98–192)

## 2022-01-05 MED ORDER — BUDESONIDE 0.25 MG/2ML IN SUSP
0.2500 mg | Freq: Two times a day (BID) | RESPIRATORY_TRACT | Status: DC
  Administered 2022-01-05 – 2022-01-15 (×20): 0.25 mg via RESPIRATORY_TRACT
  Filled 2022-01-05 (×20): qty 2

## 2022-01-05 MED ORDER — SODIUM CHLORIDE 3 % IN NEBU
4.0000 mL | INHALATION_SOLUTION | Freq: Every day | RESPIRATORY_TRACT | Status: AC
  Administered 2022-01-05 – 2022-01-07 (×3): 4 mL via RESPIRATORY_TRACT
  Filled 2022-01-05 (×3): qty 4

## 2022-01-05 MED ORDER — MAGNESIUM SULFATE 2 GM/50ML IV SOLN
2.0000 g | Freq: Once | INTRAVENOUS | Status: AC
  Administered 2022-01-05: 2 g via INTRAVENOUS
  Filled 2022-01-05: qty 50

## 2022-01-05 MED ORDER — REVEFENACIN 175 MCG/3ML IN SOLN: 175.0000 ug | Freq: Every day | RESPIRATORY_TRACT | Status: DC

## 2022-01-05 MED ORDER — NIRMATRELVIR/RITONAVIR (PAXLOVID)TABLET
3.0000 | ORAL_TABLET | Freq: Two times a day (BID) | ORAL | Status: AC
  Administered 2022-01-05 – 2022-01-09 (×9): 3 via ORAL
  Filled 2022-01-05: qty 30

## 2022-01-05 MED ORDER — IPRATROPIUM-ALBUTEROL 0.5-2.5 (3) MG/3ML IN SOLN
3.0000 mL | RESPIRATORY_TRACT | Status: DC
  Administered 2022-01-05 (×2): 3 mL via RESPIRATORY_TRACT
  Filled 2022-01-05 (×2): qty 3

## 2022-01-05 MED ORDER — LORAZEPAM 2 MG/ML IJ SOLN
1.0000 mg | Freq: Four times a day (QID) | INTRAMUSCULAR | Status: DC | PRN
  Administered 2022-01-05 – 2022-01-09 (×12): 1 mg via INTRAVENOUS
  Filled 2022-01-05 (×12): qty 1

## 2022-01-05 MED ORDER — IPRATROPIUM-ALBUTEROL 0.5-2.5 (3) MG/3ML IN SOLN
3.0000 mL | RESPIRATORY_TRACT | Status: DC | PRN
  Administered 2022-01-06 – 2022-01-10 (×6): 3 mL via RESPIRATORY_TRACT
  Filled 2022-01-05 (×7): qty 3

## 2022-01-05 MED ORDER — ARFORMOTEROL TARTRATE 15 MCG/2ML IN NEBU
15.0000 ug | INHALATION_SOLUTION | Freq: Two times a day (BID) | RESPIRATORY_TRACT | Status: DC
  Administered 2022-01-05 – 2022-01-15 (×20): 15 ug via RESPIRATORY_TRACT
  Filled 2022-01-05 (×20): qty 2

## 2022-01-05 MED ORDER — REVEFENACIN 175 MCG/3ML IN SOLN
175.0000 ug | Freq: Every day | RESPIRATORY_TRACT | Status: DC
  Administered 2022-01-05 – 2022-01-12 (×6): 175 ug via RESPIRATORY_TRACT
  Filled 2022-01-05 (×8): qty 3

## 2022-01-05 MED ORDER — IOHEXOL 350 MG/ML SOLN
80.0000 mL | Freq: Once | INTRAVENOUS | Status: AC | PRN
  Administered 2022-01-05: 80 mL via INTRAVENOUS

## 2022-01-05 MED ORDER — CHLORHEXIDINE GLUCONATE CLOTH 2 % EX PADS
6.0000 | MEDICATED_PAD | Freq: Every day | CUTANEOUS | Status: DC
  Administered 2022-01-05 – 2022-01-13 (×7): 6 via TOPICAL

## 2022-01-05 NOTE — Plan of Care (Signed)
  Problem: Education: Goal: Knowledge of risk factors and measures for prevention of condition will improve Outcome: Progressing   Problem: Education: Goal: Knowledge of General Education information will improve Description: Including pain rating scale, medication(s)/side effects and non-pharmacologic comfort measures Outcome: Progressing   Problem: Activity: Goal: Risk for activity intolerance will decrease Outcome: Progressing

## 2022-01-05 NOTE — Progress Notes (Signed)
PROGRESS NOTE    Crystal Duarte  EGB:[id number] DOB: 1960-04-08 DOA: 01/01/2022 PCP: [name], MD (Confirm with patient/family/NH records and if not entered, this HAS to be entered at [facility] point of entry. "No PCP" if truly none.)   Chief Complaint  Patient presents with   Shortness of Breath    Brief Narrative:   [name] PMHx COPD, not on home oxygen, Depression, HLD, Presents to the emergency department with shortness of breath.  The patient states that she had COVID in early August of this year.  She felt symptomatic and then improved.  She has since developed over the last couple of days congestion with no fevers.  She endorses worsening dyspnea on exertion and has had a productive cough.   Assessment & Plan:   Principal Problem:   Pneumonia due to COVID-19 virus Active Problems:   Affective bipolar disorder ([place])   Severe episode of recurrent major depressive disorder, without psychotic features (HCC)   PAD (peripheral artery disease) (HCC)   Generalized anxiety disorder   Chest pain in adult   Shortness of breath   Obesity (BMI 30-39.9)   Acute bronchitis due to Rhinovirus   Acute diastolic CHF (congestive heart failure) (HCC)   Acute respiratory failure with hypoxia (HCC)   Acute respiratory failure with hypoxia probably secondary to COVID-pneumonia/rhinovirus acute bronchitis Completed the course of remdesivir. Continue with IV Solu-Medrol 60 mg daily Change DuoNebs to every 4 hours Continue with incentive spirometry and flutter valves Continue with to follow inflammatory markers. On exam today patient has diffuse bilateral wheezing and currently requiring up to 4 L of nasal cannula oxygen. Transfer the patient to stepdown and get a chest x-ray.    General lysed anxiety disorder Continue with BuSpar if XR, and Ativan 1 mg twice daily, and patient to propranolol.    Chest pain Appears to have resolved echocardiogram reviewed  reviewed    Essential hypertension Blood pressure parameters appear to be optimal    Chronic diastolic heart failure Continue to monitor   Body mass index is 32.94 kg/m.      DVT prophylaxis: (Lovenox Code Status: Partial code Family Communication: None at bedside Disposition:   Status is: Inpatient Remains inpatient appropriate because: COVID-19 positive test (U07.1, COVID-19) with Acute Pneumonia (J12.89, Other viral pneumonia) (If respiratory failure or sepsis present, add as separate assessment)     Level of care: Stepdown Consultants:  None  Procedures: None  Antimicrobials:  Antibiotics Given (last 72 hours)     Date/Time Action Medication Dose Rate   01/03/22 0855 New Bag/Given   remdesivir 100 mg in sodium chloride 0.9 % 100 mL IVPB 100 mg 200 mL/hr        Subjective: Reports being dyspneic and coughing  Objective: Vitals:   01/05/22 0607 01/05/22 0806 01/05/22 1127 01/05/22 1232  BP: (!) 158/86   133/82  Pulse: 71   73  Resp: 20   20  Temp: 98.4 F (36.9 C)   98.4 F (36.9 C)  TempSrc: Oral   Oral  SpO2: 93% 94% 94% 94%  Weight: 87 kg     Height:        Intake/Output Summary (Last 24 hours) at 01/05/2022 1353 Last data filed at 01/05/2022 0619 Gross per 24 hour  Intake 60 ml  Output 1500 ml  Net -1440 ml   Filed Weights   01/01/22 1851 01/04/22 0654 01/05/22 0607  Weight: 85.7 kg 90 kg 87 kg    Examination:  General exam: Ill-appearing elderly lady in mild distress from shortness of breath Respiratory system: Bilateral expiratory and inspiratory wheezing heard, diminished air entry bilateral Cardiovascular system: S1 & S2 heard, tachycardic no JVD, No pedal edema. Gastrointestinal system: Abdomen is nondistended, soft and nontender. No organomegaly or masses felt. Normal bowel sounds heard. Central nervous system: Alert and oriented. No focal neurological deficits. Extremities: Symmetric 5 x 5 power. Skin: No rashes,  lesions or ulcers Psychiatry: Anxious    Data Reviewed: I have personally reviewed following labs and imaging studies  CBC: Recent Labs  Lab 01/01/22 2319 01/02/22 1033 01/03/22 0318 01/04/22 0639 01/05/22 1033  WBC 7.8 11.9* 16.8* 13.6* 14.0*  NEUTROABS 6.9 9.9* 14.1* 9.6* 8.7*  HGB 12.6 14.1 12.2 11.9* 12.9  HCT 38.7 43.2 37.2 37.3 40.0  MCV 98.5 98.0 97.9 100.0 99.3  PLT 209 232 224 204 295    Basic Metabolic Panel: Recent Labs  Lab 01/01/22 2319 01/02/22 1033 01/03/22 0318 01/04/22 0639 01/05/22 1033  NA 137 139 141 141 139  K 4.0 3.9 4.0 3.8 3.5  CL 104 103 106 105 98  CO2 '24 26 30 30 '$ 33*  GLUCOSE 177* 150* 134* 99 119*  BUN '11 13 18 17 16  '$ CREATININE 0.66 0.60 0.61 0.61 0.69  CALCIUM 8.5* 9.2 8.8* 8.5* 8.4*  MG 2.2 2.2 2.3 2.0 2.2  PHOS 3.9 3.4 3.8 3.9 4.0    GFR: Estimated Creatinine Clearance: 77.8 mL/min (by C-G formula based on SCr of 0.69 mg/dL).  Liver Function Tests: Recent Labs  Lab 01/01/22 2319 01/02/22 1033 01/03/22 0318 01/04/22 0639 01/05/22 1033  AST '18 19 19 17 20  '$ ALT '16 17 15 18 19  '$ ALKPHOS 56 64 53 50 50  BILITOT 0.6 0.6 0.5 0.4 0.4  PROT 6.5 7.4 6.2* 5.9* 6.0*  ALBUMIN 3.6 4.0 3.4* 3.3* 3.3*    CBG: Recent Labs  Lab 01/04/22 1948 01/04/22 2346 01/05/22 0358 01/05/22 0732 01/05/22 1229  GLUCAP 188* 141* 96 107* 126*     Recent Results (from the past 240 hour(s))  Resp Panel by RT-PCR (Flu A&B, Covid) Anterior Nasal Swab     Status: Abnormal   Collection Time: 01/01/22 10:51 AM   Specimen: Anterior Nasal Swab  Result Value Ref Range Status   SARS Coronavirus 2 by RT PCR POSITIVE (A) NEGATIVE Final    Comment: (NOTE) SARS-CoV-2 target nucleic acids are DETECTED.  The SARS-CoV-2 RNA is generally detectable in upper respiratory specimens during the acute phase of infection. Positive results are indicative of the presence of the identified virus, but do not rule out bacterial infection or co-infection with other  pathogens not detected by the test. Clinical correlation with patient history and other diagnostic information is necessary to determine patient infection status. The expected result is Negative.  Fact Sheet for Patients: EntrepreneurPulse.com.au  Fact Sheet for Healthcare Providers: IncredibleEmployment.be  This test is not yet approved or cleared by the [place] FDA and  has been authorized for detection and/or diagnosis of SARS-CoV-2 by FDA under an Emergency Use Authorization (EUA).  This EUA will remain in effect (meaning this test can be used) for the duration of  the COVID-19 declaration under Section 564(b)(1) of the A ct, 21 U.S.C. section 360bbb-3(b)(1), unless the authorization is terminated or revoked sooner.     Influenza A by PCR NEGATIVE NEGATIVE Final   Influenza B by PCR NEGATIVE NEGATIVE Final    Comment: (NOTE) The Xpert Xpress SARS-CoV-2/FLU/RSV plus assay is intended as  an aid in the diagnosis of influenza from Nasopharyngeal swab specimens and should not be used as a sole basis for treatment. Nasal washings and aspirates are unacceptable for Xpert Xpress SARS-CoV-2/FLU/RSV testing.  Fact Sheet for Patients: EntrepreneurPulse.com.au  Fact Sheet for Healthcare Providers: IncredibleEmployment.be  This test is not yet approved or cleared by the [place] FDA and has been authorized for detection and/or diagnosis of SARS-CoV-2 by FDA under an Emergency Use Authorization (EUA). This EUA will remain in effect (meaning this test can be used) for the duration of the COVID-19 declaration under Section 564(b)(1) of the Act, 21 U.S.C. section 360bbb-3(b)(1), unless the authorization is terminated or revoked.  Performed at [facility], [address], [place], [place] [address]   Culture, blood (Routine X 2) w Reflex to ID Panel     Status: None (Preliminary  result)   Collection Time: 01/01/22  4:51 PM   Specimen: BLOOD  Result Value Ref Range Status   Specimen Description   Final    BLOOD LEFT ANTECUBITAL Performed at [facility], [place] [address], [place], [place] [address]    Special Requests   Final    BOTTLES DRAWN AEROBIC ONLY Blood Culture adequate volume Performed at [place] [address] [place], [place] [address]    Culture   Final    NO GROWTH 4 DAYS Performed at [facility], [place] [address], [place], [place] [address]    Report Status PENDING  Incomplete  Culture, blood (Routine X 2) w Reflex to ID Panel     Status: None (Preliminary result)   Collection Time: 01/01/22  4:51 PM   Specimen: BLOOD LEFT FOREARM  Result Value Ref Range Status   Specimen Description   Final    BLOOD LEFT FOREARM Performed at [place] [address], [place], [place] [address]    Special Requests   Final    BOTTLES DRAWN AEROBIC ONLY Blood Culture adequate volume Performed at [facility] [address], [place], [place] [address]    Culture   Final    NO GROWTH 4 DAYS Performed at [facility], [place] [address] [place], [place] [address]    Report Status PENDING  Incomplete  Respiratory (~20 pathogens) panel by PCR     Status: Abnormal   Collection Time: 01/01/22  5:31 PM   Specimen: Nasopharyngeal Swab; Respiratory  Result Value Ref Range Status   Adenovirus NOT DETECTED NOT DETECTED Final   Coronavirus 229E NOT DETECTED NOT DETECTED Final    Comment: (NOTE) The Coronavirus on the Respiratory Panel, DOES NOT test for the novel  Coronavirus (2019 nCoV)    Coronavirus HKU1 NOT DETECTED NOT DETECTED Final   Coronavirus NL63 NOT DETECTED NOT DETECTED Final   Coronavirus OC43 NOT DETECTED NOT DETECTED Final   Metapneumovirus NOT DETECTED NOT DETECTED Final   Rhinovirus / Enterovirus DETECTED (A) NOT DETECTED Final   Influenza A NOT DETECTED NOT  DETECTED Final   Influenza B NOT DETECTED NOT DETECTED Final   Parainfluenza Virus 1 NOT DETECTED NOT DETECTED Final   Parainfluenza Virus 2 NOT DETECTED NOT DETECTED Final   Parainfluenza Virus 3 NOT DETECTED NOT DETECTED Final   Parainfluenza Virus 4 NOT DETECTED NOT DETECTED Final   Respiratory Syncytial Virus NOT DETECTED NOT DETECTED Final   Bordetella pertussis NOT DETECTED NOT DETECTED Final   Bordetella Parapertussis NOT DETECTED NOT DETECTED Final   Chlamydophila pneumoniae NOT  DETECTED NOT DETECTED Final   Mycoplasma pneumoniae NOT DETECTED NOT DETECTED Final    Comment: Performed at [facility], [place] [address], [place], [place] [address]  MRSA Next Gen by PCR, Nasal     Status: None   Collection Time: 01/01/22  7:02 PM   Specimen: Nasal Mucosa; Nasal Swab  Result Value Ref Range Status   MRSA by PCR Next Gen NOT DETECTED NOT DETECTED Final    Comment: (NOTE) The GeneXpert MRSA Assay (FDA approved for NASAL specimens only), is one component of a comprehensive MRSA colonization surveillance program. It is not intended to diagnose MRSA infection nor to guide or monitor treatment for MRSA infections. Test performance is not FDA approved in patients less than 61 years old. Performed at [facility], [place] [address] [place], [place] [address]   Culture, blood (Routine X 2) w Reflex to ID Panel     Status: None (Preliminary result)   Collection Time: 01/01/22  7:37 PM   Specimen: BLOOD LEFT HAND  Result Value Ref Range Status   Specimen Description   Final    BLOOD LEFT HAND Performed at [facility] [address] [place], [place] [address]    Special Requests   Final    BOTTLES DRAWN AEROBIC AND ANAEROBIC Blood Culture adequate volume Performed at [facility] [address], [place], [place] [address]    Culture   Final    NO GROWTH 4 DAYS Performed at [facility], [place] [address],  [place], [place] [address]    Report Status PENDING  Incomplete  Culture, blood (Routine X 2) w Reflex to ID Panel     Status: None (Preliminary result)   Collection Time: 01/01/22  7:44 PM   Specimen: BLOOD RIGHT HAND  Result Value Ref Range Status   Specimen Description   Final    BLOOD RIGHT HAND Performed at [facility] [address] [place], [place] [address]    Special Requests   Final    BOTTLES DRAWN AEROBIC AND ANAEROBIC Blood Culture adequate volume Performed at [facility] [address] [place], [place] [address]    Culture   Final    NO GROWTH 4 DAYS Performed at [facility], [place] [address], [place],  [address]    Report Status PENDING  Incomplete         Radiology Studies: DG CHEST PORT 1 VIEW  Result Date: 01/04/2022 CLINICAL DATA:  [id number].  COVID pneumonia. EXAM: PORTABLE CHEST 1 VIEW COMPARISON:  Portable chest 01/01/2022 FINDINGS: The heart size and mediastinal contours are within normal limits. Both lungs are clear. The visualized skeletal structures are unremarkable. There are multiple overlying monitor wires. IMPRESSION: No active disease. Electronically Signed   By: Telford Nab M.D.   On: 01/04/2022 07:20        Scheduled Meds:  ascorbic acid  500 mg Oral Daily   busPIRone  20 mg Oral TID   Chlorhexidine Gluconate Cloth  6 each Topical Daily   dextromethorphan-guaiFENesin  1 tablet Oral BID   enoxaparin (LOVENOX) injection  40 mg Subcutaneous Q24H   gabapentin  400 mg Oral BID   gabapentin  600 mg Oral QHS   influenza vac split quadrivalent PF  0.5 mL Intramuscular Tomorrow-1000   insulin aspart  0-15 Units Subcutaneous Q4H   ipratropium-albuterol  3 mL Nebulization Q4H   loratadine  10 mg Oral Daily  LORazepam  1 mg Intravenous TID   methylPREDNISolone (SOLU-MEDROL) injection  60 mg Intravenous Daily   montelukast  10 mg Oral QHS   pantoprazole  40 mg Oral Daily   propranolol  10 mg  Oral TID   traZODone  250 mg Oral QHS   venlafaxine XR  150 mg Oral Q breakfast   venlafaxine XR  75 mg Oral Q breakfast   zinc sulfate  220 mg Oral Daily   Continuous Infusions:   LOS: 4 days        Hosie Poisson, MD [facility]   To contact the attending provider between 7A-7P or the covering provider during after hours 7P-7A, please log into the web site [email or web address] and access using universal [place] password for that web site. If you do not have the password, please call the hospital operator.  01/05/2022, 1:53 PM

## 2022-01-05 NOTE — Consult Note (Signed)
NAME:  Crystal Duarte, MRN:  [id number], DOB:  12-31-1960, LOS: 4 ADMISSION DATE:  01/01/2022, CONSULTATION DATE:  01/05/22 REFERRING MD:  Karleen Hampshire - TRH , CHIEF COMPLAINT:  SOB    History of Present Illness:  61 yo f PMH tobacco use disorder, COPD, coccidioidomycosis, GAD, MDD, recent Covid infection in August 2023 who presented to [facility] ED 9/30 with SOB, cough, wheezing. Tested positive for COVID and rhinovirus  in ED. Admitted to [facility]. Started on solumedrol, duonebs, mucinex, Vit C, Zinc, remdesevir (9/28-10/2). 10/3 It sounds like dyspnea was improving in that the pt could speak in full sentences and was on [facility]  On 10/4 The pr O2 req incr to 3- 4LNC and she continued was diffusely wheezy.  PCCM was consulted in this setting     Pertinent  Medical History  COPD GAD MDD Tobacco use disorder   Significant Hospital Events: Including procedures, antibiotic start and stop dates in addition to other pertinent events   9/30 admitted + covid, + rhinovirus. started on solumedrol, remdesivir, nebs 10/2 remdesivir end  10/4 worse dyspnea. Incr O2 need to 3-4LNC. PCCM consult. Adding yupelri, brovanna, pulmicort. Paxlovid   Interim History / Subjective:  Worse dyspnea -- unable to get through a complete sentence  Feels much weaker today and a bit dizzy when she moves, which is new. Feels too fatigued to get up to use bathroom   Objective   Blood pressure (!) 123/96, pulse 92, temperature 98.9 F (37.2 C), temperature source Axillary, resp. rate 20, height '5\' 4"'$  (1.626 m), weight 86.9 kg, SpO2 93 %.    FiO2 (%):  [28 %-32 %] 28 %   Intake/Output Summary (Last 24 hours) at 01/05/2022 1702 Last data filed at 01/05/2022 0619 Gross per 24 hour  Intake 60 ml  Output 1250 ml  Net -1190 ml   Filed Weights   01/04/22 0654 01/05/22 0607 01/05/22 1345  Weight: 90 kg 87 kg 86.9 kg    Examination: General: ill appearing adult M mild distress HENT: NCAT pink mm  Lungs: Diffuse wheeze. Slight  accessory muscle use.  Cardiovascular: rrr s1s2 cap refill brisk  Abdomen: soft round  Extremities: no acute joint deformity. Trace edema Neuro: AAOx3 following commands GU: defer   Resolved Hospital Problem list     Assessment & Plan:   Acute respiratory failure with hypoxia, possible hypercarbia COPD with acute exacerbation Rhinovirus infection COVID-19 infection (improved -- tested + in August, off COVID precautions now after primary discussed with IP)  Tobacco use disorder  -interval worsening ddx-- worsening AECOPD, PE (though ddimer 0.48), superimposed bacterial infection (seems less likely), Pulmonary edema, sequelae of recent covid infection  P -Supplemental O2 for SpO2 88-92%  -send for CTA chest, r/o PE -if cannot go for CTA promptly, will get a CXR  -droplet precautions for rhinovirus  -Paxlovid in event that there is still a COVID component though doubt based on timeline -cont steroids -changing nebs -- adding brovanna, budesonide, yupelri, PRN duoneb  -cont mucinex, singulair  -2g mag  -IS, mobility  -PRN anxiolysis  -dc ABG -- clinically wouldn't add to tx plan  -PRN BiPAP for increased work of breathing  -consider abx if no clinical improvement with above interventions +/- imaging to suggest new PNA  Hyperglycemia due to steroids -SSI     Best Practice (right click and "Reselect all SmartList Selections" daily)   Diet/type: Regular consistency (see orders) DVT prophylaxis: LMWH GI prophylaxis: PPI Lines: N/A Foley:  N/A Code Status:  limited Last date of multidisciplinary goals of care discussion [--]  Labs   CBC: Recent Labs  Lab 01/01/22 2319 01/02/22 1033 01/03/22 0318 01/04/22 0639 01/05/22 1033  WBC 7.8 11.9* 16.8* 13.6* 14.0*  NEUTROABS 6.9 9.9* 14.1* 9.6* 8.7*  HGB 12.6 14.1 12.2 11.9* 12.9  HCT 38.7 43.2 37.2 37.3 40.0  MCV 98.5 98.0 97.9 100.0 99.3  PLT 209 232 224 204 001    Basic Metabolic Panel: Recent Labs  Lab  01/01/22 2319 01/02/22 1033 01/03/22 0318 01/04/22 0639 01/05/22 1033  NA 137 139 141 141 139  K 4.0 3.9 4.0 3.8 3.5  CL 104 103 106 105 98  CO2 '24 26 30 30 '$ 33*  GLUCOSE 177* 150* 134* 99 119*  BUN '11 13 18 17 16  '$ CREATININE 0.66 0.60 0.61 0.61 0.69  CALCIUM 8.5* 9.2 8.8* 8.5* 8.4*  MG 2.2 2.2 2.3 2.0 2.2  PHOS 3.9 3.4 3.8 3.9 4.0   GFR: Estimated Creatinine Clearance: 77.8 mL/min (by C-G formula based on SCr of 0.69 mg/dL). Recent Labs  Lab 01/01/22 1514 01/01/22 2319 01/02/22 1033 01/03/22 0318 01/04/22 0639 01/05/22 1033  PROCALCITON <0.10  --   --   --   --   --   WBC 9.2   < > 11.9* 16.8* 13.6* 14.0*   < > = values in this interval not displayed.    Liver Function Tests: Recent Labs  Lab 01/01/22 2319 01/02/22 1033 01/03/22 0318 01/04/22 0639 01/05/22 1033  AST '18 19 19 17 20  '$ ALT '16 17 15 18 19  '$ ALKPHOS 56 64 53 50 50  BILITOT 0.6 0.6 0.5 0.4 0.4  PROT 6.5 7.4 6.2* 5.9* 6.0*  ALBUMIN 3.6 4.0 3.4* 3.3* 3.3*   No results for input(s): "LIPASE", "AMYLASE" in the last 168 hours. No results for input(s): "AMMONIA" in the last 168 hours.  ABG    Component Value Date/Time   HCO3 29.4 (H) 01/01/2022 1821   TCO2 33 (H) 01/01/2022 1522   O2SAT 68.2 01/01/2022 1821     Coagulation Profile: No results for input(s): "INR", "PROTIME" in the last 168 hours.  Cardiac Enzymes: No results for input(s): "CKTOTAL", "CKMB", "CKMBINDEX", "TROPONINI" in the last 168 hours.  HbA1C: Hgb A1c MFr Bld  Date/Time Value Ref Range Status  01/02/2022 10:33 AM 5.9 (H) 4.8 - 5.6 % Final    Comment:    (NOTE) Pre diabetes:          5.7%-6.4%  Diabetes:              >6.4%  Glycemic control for   <7.0% adults with diabetes   10/16/2020 02:07 PM 5.9 4.6 - 6.5 % Final    Comment:    Glycemic Control Guidelines for People with Diabetes:Non Diabetic:  <6%Goal of Therapy: <7%Additional Action Suggested:  >8%     CBG: Recent Labs  Lab 01/05/22 0358 01/05/22 0732  01/05/22 1229 01/05/22 1533 01/05/22 1643  GLUCAP 96 107* 126* 164* 277*    Review of Systems:   Review of Systems  Constitutional:  Positive for malaise/fatigue.  HENT:  Positive for congestion.   Eyes: Negative.   Respiratory:  Positive for cough, shortness of breath and wheezing. Negative for hemoptysis.   Cardiovascular:  Positive for chest pain. Negative for leg swelling.  Gastrointestinal: Negative.   Genitourinary: Negative.   Musculoskeletal: Negative.   Skin: Negative.   Neurological:  Positive for dizziness and weakness.  Endo/Heme/Allergies: Negative.   Psychiatric/Behavioral:  The patient is  nervous/anxious.      Past Medical History:  She,  has a past medical history of Coccidioidomycosis, pulmonary ([place]), COPD (chronic obstructive pulmonary disease) ([place]), Depression, adenomatous polyp of colon (12/04/2014), Major depressive disorder, Osteopenia (01/2019), Panic attack, PONV (postoperative nausea and vomiting), and VAIN I (vaginal intraepithelial neoplasia grade I) (2015).   Surgical History:   Past Surgical History:  Procedure Laterality Date   ABDOMINAL HYSTERECTOMY     TAH BSO   BILATERAL VATS ABLATION     vats for biopsy due to infection   BREAST EXCISIONAL BIOPSY Right    benign   CATARACT EXTRACTION     COLONOSCOPY  2001   hemorrhoidectomy   HEMORRHOID SURGERY     LUNG SURGERY  2001   VATS surgery    OVARIAN CYST REMOVAL  1970's   ULNAR NERVE TRANSPOSITION Left 12/06/2018   Procedure: LEFT ULNAR NERVE DECOMPRESSION,  ;  Surgeon: Leanora Cover, MD;  Location: [place];  Service: Orthopedics;  Laterality: Left;   VESICOVAGINAL FISTULA CLOSURE W/ TAH  2005   WRIST ARTHROSCOPY Left 03/13/2018   Procedure: ARTHROSCOPY LEFT WRIST WITH DEBRIDEMENT;  Surgeon: Leanora Cover, MD;  Location: [place];  Service: Orthopedics;  Laterality: Left;  block     Social History:   reports that she has been smoking cigarettes. She has  a 42.00 pack-year smoking history. She has never used smokeless tobacco. She reports that she does not drink alcohol and does not use drugs.   Family History:  Her family history includes Asthma in her mother; Colon polyps in her father and mother; Heart disease in her father; Lung cancer in her maternal grandmother; Ovarian cancer in her mother; Peripheral vascular disease in her father; Varicose Veins in her mother. There is no history of Colon cancer, Esophageal cancer, Stomach cancer, Pancreatic cancer, or Liver disease.   Allergies Allergies  Allergen Reactions   Other Nausea And Vomiting    novacaine    Procaine Nausea And Vomiting   Latuda [Lurasidone Hcl] Anxiety   Sulfa Antibiotics Rash    Only in sunlight      Home Medications  Prior to Admission medications   Medication Sig Start Date End Date Taking? Authorizing Provider  albuterol (VENTOLIN HFA) 108 (90 Base) MCG/ACT inhaler Inhale 2 puffs into the lungs every 4 (four) hours as needed. 11/12/21  Yes Prince Rome, PA-C  busPIRone (BUSPAR) 10 MG tablet Take two tablets three times daily for anxiety. 05/18/21  Yes Mozingo, Berdie Ogren, NP  Cholecalciferol (VITAMIN D3 PO) Take by mouth.   Yes [provider]  gabapentin (NEURONTIN) 400 MG capsule Take 1 capsule (400 mg total) by mouth 2 (two) times daily. 05/18/21  Yes Mozingo, Berdie Ogren, NP  gabapentin (NEURONTIN) 600 MG tablet Take 1 tablet (600 mg total) by mouth daily. 05/18/21  Yes Mozingo, Berdie Ogren, NP  ipratropium-albuterol (DUONEB) 0.5-2.5 (3) MG/3ML SOLN Take 3 mLs by nebulization every 6 (six) hours as needed. Patient taking differently: Take 3 mLs by nebulization every 6 (six) hours as needed (sob/wheezing). 11/12/21  Yes Prince Rome, PA-C  montelukast (SINGULAIR) 10 MG tablet TAKE ONE TABLET BY MOUTH EVERY NIGHT AT BEDTIME 12/08/21  Yes Martyn Ehrich, NP  Multiple Vitamin (MULTI-VITAMINS) TABS Take 1 tablet by mouth daily.    Yes  [provider]  omeprazole (PRILOSEC) 20 MG capsule TAKE ONE CAPSULE BY MOUTH DAILY 12/08/21  Yes Gatha Mayer, MD  propranolol (INDERAL) 10 MG tablet  Take 1 tablet (10 mg total) by mouth 3 (three) times daily. 12/13/21  Yes Mozingo, Berdie Ogren, NP  rosuvastatin (CRESTOR) 20 MG tablet Take 1 tablet (20 mg total) by mouth daily. 02/15/21  Yes Chandrasekhar, Mahesh A, MD  traZODone (DESYREL) 100 MG tablet Take 2.5 tablets (250 mg total) by mouth at bedtime. 05/18/21  Yes Mozingo, Berdie Ogren, NP  triamcinolone ointment (KENALOG) 0.5 % Apply 1 Application topically 2 (two) times daily. 10/06/21  Yes Chrzanowski, Jami B, NP  venlafaxine XR (EFFEXOR-XR) 150 MG 24 hr capsule Take 1 capsule (150 mg total) by mouth daily with breakfast. Patient taking differently: Take 150 mg by mouth daily with breakfast. Take along with 37.5 mg tablet=187.5 mg 05/18/21  Yes Mozingo, Berdie Ogren, NP  venlafaxine XR (EFFEXOR-XR) 37.5 MG 24 hr capsule Take 37.5 mg by mouth daily with breakfast. Take along with 150 mg tablet=187.5 mg   Yes [provider]  venlafaxine XR (EFFEXOR-XR) 75 MG 24 hr capsule Take 1 capsule (75 mg total) by mouth daily. Patient not taking: Reported on 01/01/2022 12/13/21   Mozingo, Berdie Ogren, NP     Critical care time: n/a     Eliseo Gum MSN, AGACNP-BC [place] for pager  01/05/2022, 6:02 PM

## 2022-01-05 NOTE — Progress Notes (Signed)
Pt wheezing in upper & lower lobes. Scheduled duoneb given. Pt still wheezing & sob of breathing when speaking. O2 stats as low as 89%. Currently 91%. Goal >90%. Pt given robitussin prn for cough. Dr. Karleen Hampshire notified. ABG ordered. CCM consulted. Dr. Karleen Hampshire will add on saline nebulizer. Pt stable at this time

## 2022-01-05 NOTE — Progress Notes (Signed)
RT found patient on room air. She stated she was using her nasal spray and forgot to put her cannula back on. Patient was 85% room air. She was given a breathing treatment and placed back on a 2 L [place]. Rt will continue to monitor

## 2022-01-05 NOTE — Plan of Care (Signed)
  Problem: Education: Goal: Knowledge of General Education information will improve Description Including pain rating scale, medication(s)/side effects and non-pharmacologic comfort measures Outcome: Progressing   Problem: Health Behavior/Discharge Planning: Goal: Ability to manage health-related needs will improve Outcome: Progressing   

## 2022-01-06 DIAGNOSIS — U071 COVID-19: Secondary | ICD-10-CM | POA: Diagnosis not present

## 2022-01-06 DIAGNOSIS — F310 Bipolar disorder, current episode hypomanic: Secondary | ICD-10-CM | POA: Diagnosis not present

## 2022-01-06 DIAGNOSIS — I5031 Acute diastolic (congestive) heart failure: Secondary | ICD-10-CM | POA: Diagnosis not present

## 2022-01-06 DIAGNOSIS — J1282 Pneumonia due to coronavirus disease 2019: Secondary | ICD-10-CM | POA: Diagnosis not present

## 2022-01-06 DIAGNOSIS — J206 Acute bronchitis due to rhinovirus: Secondary | ICD-10-CM | POA: Diagnosis not present

## 2022-01-06 LAB — MAGNESIUM: Magnesium: 2.7 mg/dL — ABNORMAL HIGH (ref 1.7–2.4)

## 2022-01-06 LAB — CBC WITH DIFFERENTIAL/PLATELET
Abs Immature Granulocytes: 0.23 10*3/uL — ABNORMAL HIGH (ref 0.00–0.07)
Basophils Absolute: 0 10*3/uL (ref 0.0–0.1)
Basophils Relative: 0 %
Eosinophils Absolute: 0 10*3/uL (ref 0.0–0.5)
Eosinophils Relative: 0 %
HCT: 39.9 % (ref 36.0–46.0)
Hemoglobin: 13.1 g/dL (ref 12.0–15.0)
Immature Granulocytes: 2 %
Lymphocytes Relative: 15 %
Lymphs Abs: 2.3 10*3/uL (ref 0.7–4.0)
MCH: 32 pg (ref 26.0–34.0)
MCHC: 32.8 g/dL (ref 30.0–36.0)
MCV: 97.6 fL (ref 80.0–100.0)
Monocytes Absolute: 1.1 10*3/uL — ABNORMAL HIGH (ref 0.1–1.0)
Monocytes Relative: 7 %
Neutro Abs: 11.1 10*3/uL — ABNORMAL HIGH (ref 1.7–7.7)
Neutrophils Relative %: 76 %
Platelets: 226 10*3/uL (ref 150–400)
RBC: 4.09 MIL/uL (ref 3.87–5.11)
RDW: 12.6 % (ref 11.5–15.5)
WBC: 14.7 10*3/uL — ABNORMAL HIGH (ref 4.0–10.5)
nRBC: 0 % (ref 0.0–0.2)

## 2022-01-06 LAB — CULTURE, BLOOD (ROUTINE X 2)
Culture: NO GROWTH
Culture: NO GROWTH
Culture: NO GROWTH
Culture: NO GROWTH
Special Requests: ADEQUATE
Special Requests: ADEQUATE
Special Requests: ADEQUATE
Special Requests: ADEQUATE

## 2022-01-06 LAB — COMPREHENSIVE METABOLIC PANEL
ALT: 19 U/L (ref 0–44)
AST: 17 U/L (ref 15–41)
Albumin: 3.2 g/dL — ABNORMAL LOW (ref 3.5–5.0)
Alkaline Phosphatase: 54 U/L (ref 38–126)
Anion gap: 7 (ref 5–15)
BUN: 14 mg/dL (ref 8–23)
CO2: 31 mmol/L (ref 22–32)
Calcium: 8.5 mg/dL — ABNORMAL LOW (ref 8.9–10.3)
Chloride: 102 mmol/L (ref 98–111)
Creatinine, Ser: 0.67 mg/dL (ref 0.44–1.00)
GFR, Estimated: >60 mL/min (ref >60)
Glucose, Bld: 117 mg/dL — ABNORMAL HIGH (ref 70–99)
Potassium: 3.8 mmol/L (ref 3.5–5.1)
Sodium: 140 mmol/L (ref 135–145)
Total Bilirubin: 0.5 mg/dL (ref 0.3–1.2)
Total Protein: 5.8 g/dL — ABNORMAL LOW (ref 6.5–8.1)

## 2022-01-06 LAB — GLUCOSE, CAPILLARY
Glucose-Capillary: 105 mg/dL — ABNORMAL HIGH (ref 70–99)
Glucose-Capillary: 107 mg/dL — ABNORMAL HIGH (ref 70–99)
Glucose-Capillary: 116 mg/dL — ABNORMAL HIGH (ref 70–99)
Glucose-Capillary: 129 mg/dL — ABNORMAL HIGH (ref 70–99)
Glucose-Capillary: 242 mg/dL — ABNORMAL HIGH (ref 70–99)
Glucose-Capillary: 249 mg/dL — ABNORMAL HIGH (ref 70–99)

## 2022-01-06 LAB — FERRITIN: Ferritin: 79 ng/mL (ref 11–307)

## 2022-01-06 LAB — PHOSPHORUS: Phosphorus: 4.5 mg/dL (ref 2.5–4.6)

## 2022-01-06 LAB — LACTATE DEHYDROGENASE: LDH: 135 U/L (ref 98–192)

## 2022-01-06 LAB — D-DIMER, QUANTITATIVE: D-Dimer, Quant: 0.31 ug/mL-FEU (ref 0.00–0.50)

## 2022-01-06 MED ORDER — BUSPIRONE HCL 5 MG PO TABS
2.5000 mg | ORAL_TABLET | Freq: Every day | ORAL | Status: AC
  Administered 2022-01-07 – 2022-01-13 (×7): 2.5 mg via ORAL
  Filled 2022-01-06 (×7): qty 1

## 2022-01-06 MED ORDER — HYDRALAZINE HCL 25 MG PO TABS
25.0000 mg | ORAL_TABLET | Freq: Three times a day (TID) | ORAL | Status: DC
  Administered 2022-01-06 – 2022-01-14 (×24): 25 mg via ORAL
  Filled 2022-01-06 (×24): qty 1

## 2022-01-06 MED ORDER — ORAL CARE MOUTH RINSE
15.0000 mL | OROMUCOSAL | Status: DC
  Administered 2022-01-07 – 2022-01-14 (×27): 15 mL via OROMUCOSAL

## 2022-01-06 MED ORDER — BUSPIRONE HCL 5 MG PO TABS
20.0000 mg | ORAL_TABLET | Freq: Three times a day (TID) | ORAL | Status: DC
  Administered 2022-01-14 – 2022-01-15 (×4): 20 mg via ORAL
  Filled 2022-01-06 (×4): qty 4

## 2022-01-06 NOTE — Progress Notes (Signed)
NAME:  Crystal Duarte, MRN:  [id number], DOB:  [birth year], LOS: 5 ADMISSION DATE:  01/01/2022, CONSULTATION DATE:  01/05/22 REFERRING MD:  Karleen Hampshire - TRH , CHIEF COMPLAINT:  SOB    History of Present Illness:  61 yo f PMH tobacco use disorder, COPD, coccidioidomycosis, GAD, MDD, recent Covid infection in August 2023 who presented to [facility] ED 9/30 with SOB, cough, wheezing. Tested positive for COVID and rhinovirus  in ED. Admitted to [facility]. Started on solumedrol, duonebs, mucinex, Vit C, Zinc, remdesevir (9/28-10/2). 10/3 It sounds like dyspnea was improving in that the pt could speak in full sentences and was on [facility]  On 10/4 The pr O2 req incr to 3- 4LNC and she continued was diffusely wheezy.  PCCM was consulted in this setting     Pertinent  Medical History  COPD GAD MDD Tobacco use disorder   Significant Hospital Events: Including procedures, antibiotic start and stop dates in addition to other pertinent events   9/30 admitted + covid, + rhinovirus. started on solumedrol, remdesivir, nebs 10/2 remdesivir end  10/4 worse dyspnea. Incr O2 need to 3-4LNC. PCCM consult. Adding yupelri, brovanna, pulmicort. Paxlovid   Interim History / Subjective:  Worse dyspnea -- unable to get through a complete sentence  Feels much weaker today and a bit dizzy when she moves, which is new. Feels too fatigued to get up to use bathroom   Objective   Blood pressure (!) 114/92, pulse 64, temperature 98 F (36.7 C), temperature source Oral, resp. rate 18, height '5\' 4"'$  (1.626 m), weight 86.9 kg, SpO2 98 %.    FiO2 (%):  [28 %] 28 %   Intake/Output Summary (Last 24 hours) at 01/06/2022 0923 Last data filed at 01/05/2022 1821 Gross per 24 hour  Intake 602.18 ml  Output 1000 ml  Net -397.82 ml    Filed Weights   01/04/22 0654 01/05/22 0607 01/05/22 1345  Weight: 90 kg 87 kg 86.9 kg    Examination: General: ill appearing adult F NAD  HENT: NCAT pink mm  Lungs: L > R wheeze. No rhonchi. No  accessory muscle use  Cardiovascular: rrr s1s2 cap refill < 3sec Abdomen: soft ndnt  Extremities: trace edema. No acute joint deformity  Neuro: AAOx3 following commands  GU: defer   Resolved Hospital Problem list     Assessment & Plan:    Acute respiratory failure with hypoxia AECOPD Rhinovirus infection with bronchitis Recent COVID infection  Tobacco use disorder  -CTA chest no PE  -some improvement in wheeze and tachypnea 10/5.  P -Supplemental O2 for SpO2 88-92%  -droplet precautions for rhinovirus  -cont steroids -cont paxlovid in event that there is a lingering covid component  -yupelri, brovanna, pulmicort, PRN duoneb -HTS nebs -singulair, claritin, mucinex, robitussin  -PRN BiPAP for increased WOB  -IS, mobility  Hyperglycemia due to steroids -SSI     Best Practice (right click and "Reselect all SmartList Selections" daily)   Diet/type: Regular consistency (see orders) DVT prophylaxis: LMWH GI prophylaxis: PPI Lines: N/A Foley:  N/A Code Status:  limited Last date of multidisciplinary goals of care discussion [--]  Labs   CBC: Recent Labs  Lab 01/02/22 1033 01/03/22 0318 01/04/22 0639 01/05/22 1033 01/06/22 0307  WBC 11.9* 16.8* 13.6* 14.0* 14.7*  NEUTROABS 9.9* 14.1* 9.6* 8.7* 11.1*  HGB 14.1 12.2 11.9* 12.9 13.1  HCT 43.2 37.2 37.3 40.0 39.9  MCV 98.0 97.9 100.0 99.3 97.6  PLT 232 224 204 226 226  Basic Metabolic Panel: Recent Labs  Lab 01/02/22 1033 01/03/22 0318 01/04/22 0639 01/05/22 1033 01/06/22 0307  NA 139 141 141 139 140  K 3.9 4.0 3.8 3.5 3.8  CL 103 106 105 98 102  CO2 '26 30 30 '$ 33* 31  GLUCOSE 150* 134* 99 119* 117*  BUN '13 18 17 16 14  '$ CREATININE 0.60 0.61 0.61 0.69 0.67  CALCIUM 9.2 8.8* 8.5* 8.4* 8.5*  MG 2.2 2.3 2.0 2.2 2.7*  PHOS 3.4 3.8 3.9 4.0 4.5    GFR: Estimated Creatinine Clearance: 77.8 mL/min (by C-G formula based on SCr of 0.67 mg/dL). Recent Labs  Lab 01/01/22 1514 01/01/22 2319  01/03/22 0318 01/04/22 0639 01/05/22 1033 01/06/22 0307  PROCALCITON <0.10  --   --   --   --   --   WBC 9.2   < > 16.8* 13.6* 14.0* 14.7*   < > = values in this interval not displayed.     Liver Function Tests: Recent Labs  Lab 01/02/22 1033 01/03/22 0318 01/04/22 0639 01/05/22 1033 01/06/22 0307  AST '19 19 17 20 17  '$ ALT '17 15 18 19 19  '$ ALKPHOS 64 53 50 50 54  BILITOT 0.6 0.5 0.4 0.4 0.5  PROT 7.4 6.2* 5.9* 6.0* 5.8*  ALBUMIN 4.0 3.4* 3.3* 3.3* 3.2*    No results for input(s): "LIPASE", "AMYLASE" in the last 168 hours. No results for input(s): "AMMONIA" in the last 168 hours.  ABG    Component Value Date/Time   HCO3 29.4 (H) 01/01/2022 1821   TCO2 33 (H) 01/01/2022 1522   O2SAT 68.2 01/01/2022 1821     Coagulation Profile: No results for input(s): "INR", "PROTIME" in the last 168 hours.  Cardiac Enzymes: No results for input(s): "CKTOTAL", "CKMB", "CKMBINDEX", "TROPONINI" in the last 168 hours.  HbA1C: Hgb A1c MFr Bld  Date/Time Value Ref Range Status  01/02/2022 10:33 AM 5.9 (H) 4.8 - 5.6 % Final    Comment:    (NOTE) Pre diabetes:          5.7%-6.4%  Diabetes:              >6.4%  Glycemic control for   <7.0% adults with diabetes   10/16/2020 02:07 PM 5.9 4.6 - 6.5 % Final    Comment:    Glycemic Control Guidelines for People with Diabetes:Non Diabetic:  <6%Goal of Therapy: <7%Additional Action Suggested:  >8%     CBG: Recent Labs  Lab 01/05/22 1643 01/05/22 2024 01/05/22 2358 01/06/22 0440 01/06/22 0753  GLUCAP 277* 284* 116* 116* 105*   CCT: n/a  Eliseo Gum MSN, AGACNP-BC [place] for pager  01/06/2022, 10:48 AM

## 2022-01-06 NOTE — Progress Notes (Signed)
PROGRESS NOTE    Crystal Duarte  TXM:[id number] DOB: 31-Jul-1960 DOA: 01/01/2022 PCP: Crystal Koch, MD (Confirm with patient/family/NH records and if not entered, this HAS to be entered at [facility] point of entry. "No PCP" if truly none.)   Chief Complaint  Patient presents with   Shortness of Breath    Brief Narrative:   [name] PMHx COPD,Depression, HLD, Presents to the emergency department with shortness of breath.  The patient states that she had COVID in early August of this year. She presents with  sob, productive cough,. She was admitted for acute respiratory failure with hypoxia. Her RVP is positive for rhino virus. She was started on IV solumedrol, completed Remdesevir. PCCM consulted as she continues to worsen with worsening cough, sob,and diffuse wheezing despite being on steroids and neb treatments. CTA of the chest is negative for PE study.   Assessment & Plan:   Principal Problem:   Pneumonia due to COVID-19 virus Active Problems:   Affective bipolar disorder ([place])   Severe episode of recurrent major depressive disorder, without psychotic features (HCC)   PAD (peripheral artery disease) (HCC)   Generalized anxiety disorder   Chest pain in adult   Shortness of breath   Obesity (BMI 30-39.9)   Acute bronchitis due to Rhinovirus   Acute diastolic CHF (congestive heart failure) (HCC)   Acute respiratory failure with hypoxia (HCC)   COPD exacerbation (HCC)   COVID-19   Acute respiratory failure with hypoxia probably secondary to COVID-pneumonia/rhinovirus acute bronchitis/ acute copd exacerbation.  Completed the course of remdesivir. Continue with IV Solu-Medrol 60 mg daily Change DuoNebs to every 4 hours Continue with incentive spirometry and flutter valves Continue with to follow inflammatory markers. On exam today patient has diffuse bilateral wheezing and currently requiring up to 4 L of nasal cannula oxygen. Transferred the patient to stepdown ,  PCCM consulted for worsening symptoms.  Paxlovid ordered and prn bipap for symptomatic relief.  CTA of the chest is negative for PE, effusion and consolidation.     General lysed anxiety disorder Continue with BuSpar ,  and Ativan 1 mg twice daily, and continue wtih propranolol.    Chest pain Appears to have resolved . echocardiogram reviewed     Essential hypertension Blood pressure parameters slightly elevated , added hydralazine 25 mg TID.     Chronic diastolic heart failure Continue to monitor. CTA neg for effusion.  Does not appear to be decompensated.    Body mass index is 32.88 kg/m. Obesity:       DVT prophylaxis: (Lovenox Code Status: Partial code Family Communication: None at bedside Disposition:   Status is: Inpatient Remains inpatient appropriate because: COVID-19 positive test (U07.1, COVID-19) with Acute Pneumonia (J12.89, Other viral pneumonia) (If respiratory failure or sepsis present, add as separate assessment)     Level of care: Stepdown Consultants:  PCCM.  Procedures: None  Antimicrobials:  Antibiotics Given (last 72 hours)     Date/Time Action Medication Dose   01/05/22 2041 Given   nirmatrelvir/ritonavir EUA (PAXLOVID) 3 tablet 3 tablet   01/06/22 0905 Given   nirmatrelvir/ritonavir EUA (PAXLOVID) 3 tablet 3 tablet        Subjective: STILL VERY SOB, cough and on [place] oxygen. Bilateral audible wheezing.   Objective: Vitals:   01/06/22 0800 01/06/22 0813 01/06/22 0900 01/06/22 1130  BP: 129/83  (!) 158/85   Pulse: (!) 59  80   Resp: 16  (!) 22   Temp: 98 F (36.7 C)  98.3 F (36.8 C)  TempSrc: Oral   Axillary  SpO2: 100% 98% 90%   Weight:      Height:        Intake/Output Summary (Last 24 hours) at 01/06/2022 1301 Last data filed at 01/05/2022 1821 Gross per 24 hour  Intake 602.18 ml  Output 1000 ml  Net -397.82 ml    Filed Weights   01/04/22 0654 01/05/22 0607 01/05/22 1345  Weight: 90 kg 87 kg 86.9 kg     Examination:  General exam: ill appearing lady on [place] oxygen with mod distress from sob. Respiratory system: bilateral exp and inspiratory wheezing, air entry fair.  Cardiovascular system: S1 & S2 heard, RRR. No JVD,  No pedal edema. Gastrointestinal system: Abdomen is nondistended, soft and nontender.  Normal bowel sounds heard. Central nervous system: Alert and oriented. No focal neurological deficits. Extremities: Symmetric 5 x 5 power. Skin: No rashes, lesions or ulcers Psychiatry: anxious.     Data Reviewed: I have personally reviewed following labs and imaging studies  CBC: Recent Labs  Lab 01/02/22 1033 01/03/22 0318 01/04/22 0639 01/05/22 1033 01/06/22 0307  WBC 11.9* 16.8* 13.6* 14.0* 14.7*  NEUTROABS 9.9* 14.1* 9.6* 8.7* 11.1*  HGB 14.1 12.2 11.9* 12.9 13.1  HCT 43.2 37.2 37.3 40.0 39.9  MCV 98.0 97.9 100.0 99.3 97.6  PLT 232 224 204 226 226     Basic Metabolic Panel: Recent Labs  Lab 01/02/22 1033 01/03/22 0318 01/04/22 0639 01/05/22 1033 01/06/22 0307  NA 139 141 141 139 140  K 3.9 4.0 3.8 3.5 3.8  CL 103 106 105 98 102  CO2 '26 30 30 '$ 33* 31  GLUCOSE 150* 134* 99 119* 117*  BUN '13 18 17 16 14  '$ CREATININE 0.60 0.61 0.61 0.69 0.67  CALCIUM 9.2 8.8* 8.5* 8.4* 8.5*  MG 2.2 2.3 2.0 2.2 2.7*  PHOS 3.4 3.8 3.9 4.0 4.5     GFR: Estimated Creatinine Clearance: 77.8 mL/min (by C-G formula based on SCr of 0.67 mg/dL).  Liver Function Tests: Recent Labs  Lab 01/02/22 1033 01/03/22 0318 01/04/22 0639 01/05/22 1033 01/06/22 0307  AST '19 19 17 20 17  '$ ALT '17 15 18 19 19  '$ ALKPHOS 64 53 50 50 54  BILITOT 0.6 0.5 0.4 0.4 0.5  PROT 7.4 6.2* 5.9* 6.0* 5.8*  ALBUMIN 4.0 3.4* 3.3* 3.3* 3.2*     CBG: Recent Labs  Lab 01/05/22 2024 01/05/22 2358 01/06/22 0440 01/06/22 0753 01/06/22 1123  GLUCAP 284* 116* 116* 105* 129*      Recent Results (from the past 240 hour(s))  Resp Panel by RT-PCR (Flu A&B, Covid) Anterior Nasal Swab     Status:  Abnormal   Collection Time: 01/01/22 10:51 AM   Specimen: Anterior Nasal Swab  Result Value Ref Range Status   SARS Coronavirus 2 by RT PCR POSITIVE (A) NEGATIVE Final    Comment: (NOTE) SARS-CoV-2 target nucleic acids are DETECTED.  The SARS-CoV-2 RNA is generally detectable in upper respiratory specimens during the acute phase of infection. Positive results are indicative of the presence of the identified virus, but do not rule out bacterial infection or co-infection with other pathogens not detected by the test. Clinical correlation with patient history and other diagnostic information is necessary to determine patient infection status. The expected result is Negative.  Fact Sheet for Patients: EntrepreneurPulse.com.au  Fact Sheet for Healthcare Providers: IncredibleEmployment.be  This test is not yet approved or cleared by the [place] FDA and  has been  authorized for detection and/or diagnosis of SARS-CoV-2 by FDA under an Emergency Use Authorization (EUA).  This EUA will remain in effect (meaning this test can be used) for the duration of  the COVID-19 declaration under Section 564(b)(1) of the A ct, 21 U.S.C. section 360bbb-3(b)(1), unless the authorization is terminated or revoked sooner.     Influenza A by PCR NEGATIVE NEGATIVE Final   Influenza B by PCR NEGATIVE NEGATIVE Final    Comment: (NOTE) The Xpert Xpress SARS-CoV-2/FLU/RSV plus assay is intended as an aid in the diagnosis of influenza from Nasopharyngeal swab specimens and should not be used as a sole basis for treatment. Nasal washings and aspirates are unacceptable for Xpert Xpress SARS-CoV-2/FLU/RSV testing.  Fact Sheet for Patients: EntrepreneurPulse.com.au  Fact Sheet for Healthcare Providers: IncredibleEmployment.be  This test is not yet approved or cleared by the [place] FDA and has been authorized for detection  and/or diagnosis of SARS-CoV-2 by FDA under an Emergency Use Authorization (EUA). This EUA will remain in effect (meaning this test can be used) for the duration of the COVID-19 declaration under Section 564(b)(1) of the Act, 21 U.S.C. section 360bbb-3(b)(1), unless the authorization is terminated or revoked.  Performed at [facility], [address] [place], [place] [address]   Culture, blood (Routine X 2) w Reflex to ID Panel     Status: None   Collection Time: 01/01/22  4:51 PM   Specimen: BLOOD  Result Value Ref Range Status   Specimen Description   Final    BLOOD LEFT ANTECUBITAL Performed at [facility], [place] [address] [place], [place] [address]    Special Requests   Final    BOTTLES DRAWN AEROBIC ONLY Blood Culture adequate volume Performed at [facility] [address], [place], [place] [address]    Culture   Final    NO GROWTH 5 DAYS Performed at [facility], [place] [address], [place], [place] [address]    Report Status 01/06/2022 FINAL  Final  Culture, blood (Routine X 2) w Reflex to ID Panel     Status: None   Collection Time: 01/01/22  4:51 PM   Specimen: BLOOD LEFT FOREARM  Result Value Ref Range Status   Specimen Description   Final    BLOOD LEFT FOREARM Performed at [facility] [address] [place], [place] [address]    Special Requests   Final    BOTTLES DRAWN AEROBIC ONLY Blood Culture adequate volume Performed at [facility] [address], [place], [place] [address]    Culture   Final    NO GROWTH 5 DAYS Performed at [facility], [place] [address] [place], [place] [address]    Report Status 01/06/2022 FINAL  Final  Respiratory (~20 pathogens) panel by PCR     Status: Abnormal   Collection Time: 01/01/22  5:31 PM   Specimen: Nasopharyngeal Swab; Respiratory  Result Value Ref Range Status   Adenovirus NOT DETECTED NOT DETECTED Final   Coronavirus  229E NOT DETECTED NOT DETECTED Final    Comment: (NOTE) The Coronavirus on the Respiratory Panel, DOES NOT test for the novel  Coronavirus (2019 nCoV)    Coronavirus HKU1 NOT DETECTED NOT DETECTED Final   Coronavirus NL63 NOT DETECTED NOT DETECTED Final   Coronavirus OC43 NOT DETECTED NOT DETECTED Final   Metapneumovirus NOT DETECTED NOT DETECTED Final   Rhinovirus / Enterovirus DETECTED (A) NOT DETECTED Final   Influenza A  NOT DETECTED NOT DETECTED Final   Influenza B NOT DETECTED NOT DETECTED Final   Parainfluenza Virus 1 NOT DETECTED NOT DETECTED Final   Parainfluenza Virus 2 NOT DETECTED NOT DETECTED Final   Parainfluenza Virus 3 NOT DETECTED NOT DETECTED Final   Parainfluenza Virus 4 NOT DETECTED NOT DETECTED Final   Respiratory Syncytial Virus NOT DETECTED NOT DETECTED Final   Bordetella pertussis NOT DETECTED NOT DETECTED Final   Bordetella Parapertussis NOT DETECTED NOT DETECTED Final   Chlamydophila pneumoniae NOT DETECTED NOT DETECTED Final   Mycoplasma pneumoniae NOT DETECTED NOT DETECTED Final    Comment: Performed at [facility], [place] [address] [place], [place] [address]  MRSA Next Gen by PCR, Nasal     Status: None   Collection Time: 01/01/22  7:02 PM   Specimen: Nasal Mucosa; Nasal Swab  Result Value Ref Range Status   MRSA by PCR Next Gen NOT DETECTED NOT DETECTED Final    Comment: (NOTE) The GeneXpert MRSA Assay (FDA approved for NASAL specimens only), is one component of a comprehensive MRSA colonization surveillance program. It is not intended to diagnose MRSA infection nor to guide or monitor treatment for MRSA infections. Test performance is not FDA approved in patients less than 29 years old. Performed at [facility], [place] [address] [place], [place] [address]   Culture, blood (Routine X 2) w Reflex to ID Panel     Status: None   Collection Time: 01/01/22  7:37 PM   Specimen: BLOOD LEFT HAND  Result Value Ref Range Status    Specimen Description   Final    BLOOD LEFT HAND Performed at [facility] [address] [place], [place] [address]    Special Requests   Final    BOTTLES DRAWN AEROBIC AND ANAEROBIC Blood Culture adequate volume Performed at [facility] [address] [place], [place] [address]    Culture   Final    NO GROWTH 5 DAYS Performed at [facility], [place] [address], [place], [place] [address]    Report Status 01/06/2022 FINAL  Final  Culture, blood (Routine X 2) w Reflex to ID Panel     Status: None   Collection Time: 01/01/22  7:44 PM   Specimen: BLOOD RIGHT HAND  Result Value Ref Range Status   Specimen Description   Final    BLOOD RIGHT HAND Performed at [facility] [address] [place], [place] [address]    Special Requests   Final    BOTTLES DRAWN AEROBIC AND ANAEROBIC Blood Culture adequate volume Performed at [facility] [address] [place], [place] [address]    Culture   Final    NO GROWTH 5 DAYS Performed at [facility], [place] [address] [place], [place] [address]    Report Status 01/06/2022 FINAL  Final         Radiology Studies: CT Angio Chest Pulmonary Embolism (PE) W or WO Contrast  Result Date: 01/05/2022 CLINICAL DATA:  High clinical suspicion for pulmonary embolism EXAM: CT ANGIOGRAPHY CHEST WITH CONTRAST TECHNIQUE: Multidetector CT imaging of the chest was performed using the standard protocol during bolus administration of intravenous contrast. Multiplanar CT image reconstructions and MIPs were obtained to evaluate the vascular anatomy. RADIATION DOSE REDUCTION: This exam was performed according to the departmental dose-optimization program which includes automated exposure control, adjustment of the mA and/or kV according to patient size and/or use of iterative  reconstruction technique. CONTRAST:  24m OMNIPAQUE IOHEXOL 350 MG/ML SOLN COMPARISON:  10/15/2021  FINDINGS: Cardiovascular: There is homogeneous enhancement in thoracic aorta. Left vertebral artery is arising from the aortic arch. There are no intraluminal filling defects in pulmonary artery branches. Mediastinum/Nodes: No significant lymphadenopathy seen. Lungs/Pleura: There is no focal pulmonary consolidation. Surgical staples are seen in right upper lung field. No discrete lung nodules are seen. There is no pleural effusion or pneumothorax. Upper Abdomen: There is fatty infiltration in liver. Musculoskeletal: No acute findings are seen. Review of the MIP images confirms the above findings. IMPRESSION: There is no evidence of pulmonary artery embolism. There is no evidence of thoracic aortic dissection. There is no focal pulmonary consolidation. Fatty liver. Electronically Signed   By: PElmer PickerM.D.   On: 01/05/2022 18:14        Scheduled Meds:  arformoterol  15 mcg Nebulization BID   ascorbic acid  500 mg Oral Daily   budesonide (PULMICORT) nebulizer solution  0.25 mg Nebulization BID   [START ON 01/07/2022] busPIRone  2.5 mg Oral Daily   [START ON 01/14/2022] busPIRone  20 mg Oral TID   Chlorhexidine Gluconate Cloth  6 each Topical Daily   dextromethorphan-guaiFENesin  1 tablet Oral BID   enoxaparin (LOVENOX) injection  40 mg Subcutaneous Q24H   gabapentin  400 mg Oral BID   gabapentin  600 mg Oral QHS   influenza vac split quadrivalent PF  0.5 mL Intramuscular Tomorrow-1000   insulin aspart  0-15 Units Subcutaneous Q4H   loratadine  10 mg Oral Daily   methylPREDNISolone (SOLU-MEDROL) injection  60 mg Intravenous Daily   montelukast  10 mg Oral QHS   nirmatrelvir/ritonavir EUA  3 tablet Oral BID   pantoprazole  40 mg Oral Daily   propranolol  10 mg Oral TID   revefenacin  175 mcg Nebulization Daily   sodium chloride HYPERTONIC  4 mL Nebulization Daily   traZODone  250 mg Oral QHS   venlafaxine XR  150 mg Oral Q breakfast   venlafaxine XR  75 mg Oral Q breakfast   zinc  sulfate  220 mg Oral Daily   Continuous Infusions:   LOS: 5 days        VHosie Poisson MD [facility]   To contact the attending provider between 7A-7P or the covering provider during after hours 7P-7A, please log into the web site [email or web address] and access using universal [place] password for that web site. If you do not have the password, please call the hospital operator.  01/06/2022, 1:01 PM

## 2022-01-06 NOTE — Progress Notes (Signed)
PT Cancellation Note  Patient Details Name: Fabian Coca MRN: [id number] DOB: 07-02-1960   Cancelled Treatment:    Reason Eval/Treat Not Completed: Medical issues which prohibited therapy, was  on BiPAP when checked on patient, RN recommended to wait and try tomorrow as patient is very SOB.  [place] Office [phone] Weekend [phone]    Claretha Cooper 01/06/2022, 3:14 PM

## 2022-01-06 NOTE — Progress Notes (Signed)
Pt came off the BIPAP at 1200 to eat

## 2022-01-07 DIAGNOSIS — I5031 Acute diastolic (congestive) heart failure: Secondary | ICD-10-CM | POA: Diagnosis not present

## 2022-01-07 DIAGNOSIS — J1282 Pneumonia due to coronavirus disease 2019: Secondary | ICD-10-CM | POA: Diagnosis not present

## 2022-01-07 DIAGNOSIS — J206 Acute bronchitis due to rhinovirus: Secondary | ICD-10-CM | POA: Diagnosis not present

## 2022-01-07 DIAGNOSIS — F310 Bipolar disorder, current episode hypomanic: Secondary | ICD-10-CM | POA: Diagnosis not present

## 2022-01-07 DIAGNOSIS — U071 COVID-19: Secondary | ICD-10-CM | POA: Diagnosis not present

## 2022-01-07 LAB — CBC WITH DIFFERENTIAL/PLATELET
Abs Immature Granulocytes: 0.36 10*3/uL — ABNORMAL HIGH (ref 0.00–0.07)
Basophils Absolute: 0.1 10*3/uL (ref 0.0–0.1)
Basophils Relative: 0 %
Eosinophils Absolute: 0 10*3/uL (ref 0.0–0.5)
Eosinophils Relative: 0 %
HCT: 42.1 % (ref 36.0–46.0)
Hemoglobin: 13.7 g/dL (ref 12.0–15.0)
Immature Granulocytes: 2 %
Lymphocytes Relative: 12 %
Lymphs Abs: 2.1 10*3/uL (ref 0.7–4.0)
MCH: 31.8 pg (ref 26.0–34.0)
MCHC: 32.5 g/dL (ref 30.0–36.0)
MCV: 97.7 fL (ref 80.0–100.0)
Monocytes Absolute: 0.6 10*3/uL (ref 0.1–1.0)
Monocytes Relative: 4 %
Neutro Abs: 15.1 10*3/uL — ABNORMAL HIGH (ref 1.7–7.7)
Neutrophils Relative %: 82 %
Platelets: 264 10*3/uL (ref 150–400)
RBC: 4.31 MIL/uL (ref 3.87–5.11)
RDW: 12.8 % (ref 11.5–15.5)
WBC: 18.3 10*3/uL — ABNORMAL HIGH (ref 4.0–10.5)
nRBC: 0 % (ref 0.0–0.2)

## 2022-01-07 LAB — GLUCOSE, CAPILLARY
Glucose-Capillary: 110 mg/dL — ABNORMAL HIGH (ref 70–99)
Glucose-Capillary: 133 mg/dL — ABNORMAL HIGH (ref 70–99)
Glucose-Capillary: 185 mg/dL — ABNORMAL HIGH (ref 70–99)
Glucose-Capillary: 150 mg/dL — ABNORMAL HIGH (ref 70–99)
Glucose-Capillary: 197 mg/dL — ABNORMAL HIGH (ref 70–99)

## 2022-01-07 LAB — COMPREHENSIVE METABOLIC PANEL
ALT: 20 U/L (ref 0–44)
AST: 15 U/L (ref 15–41)
Albumin: 3.4 g/dL — ABNORMAL LOW (ref 3.5–5.0)
Alkaline Phosphatase: 66 U/L (ref 38–126)
Anion gap: 8 (ref 5–15)
BUN: 18 mg/dL (ref 8–23)
CO2: 31 mmol/L (ref 22–32)
Calcium: 9 mg/dL (ref 8.9–10.3)
Chloride: 102 mmol/L (ref 98–111)
Creatinine, Ser: 0.85 mg/dL (ref 0.44–1.00)
GFR, Estimated: >60 mL/min (ref >60)
Glucose, Bld: 109 mg/dL — ABNORMAL HIGH (ref 70–99)
Potassium: 3.5 mmol/L (ref 3.5–5.1)
Sodium: 141 mmol/L (ref 135–145)
Total Bilirubin: 0.6 mg/dL (ref 0.3–1.2)
Total Protein: 6.2 g/dL — ABNORMAL LOW (ref 6.5–8.1)

## 2022-01-07 LAB — PHOSPHORUS: Phosphorus: 4.5 mg/dL (ref 2.5–4.6)

## 2022-01-07 LAB — LACTATE DEHYDROGENASE: LDH: 149 U/L (ref 98–192)

## 2022-01-07 LAB — FERRITIN: Ferritin: 83 ng/mL (ref 11–307)

## 2022-01-07 LAB — D-DIMER, QUANTITATIVE: D-Dimer, Quant: <0.27 ug/mL-FEU (ref 0.00–0.50)

## 2022-01-07 LAB — MAGNESIUM: Magnesium: 2.3 mg/dL (ref 1.7–2.4)

## 2022-01-07 MED ORDER — HYDRALAZINE HCL 20 MG/ML IJ SOLN
10.0000 mg | Freq: Four times a day (QID) | INTRAMUSCULAR | Status: DC | PRN
  Administered 2022-01-09: 10 mg via INTRAVENOUS
  Filled 2022-01-07: qty 1

## 2022-01-07 MED ORDER — FUROSEMIDE 10 MG/ML IJ SOLN
40.0000 mg | Freq: Once | INTRAMUSCULAR | Status: AC
  Administered 2022-01-07: 40 mg via INTRAVENOUS
  Filled 2022-01-07: qty 4

## 2022-01-07 NOTE — Progress Notes (Signed)
NAME:  Crystal Duarte, MRN:  [id number], DOB:  1961/03/22, LOS: 6 ADMISSION DATE:  01/01/2022, CONSULTATION DATE:  01/05/22 REFERRING MD:  Karleen Hampshire - TRH , CHIEF COMPLAINT:  SOB    History of Present Illness:  61 yo f PMH tobacco use disorder, COPD, coccidioidomycosis, GAD, MDD, recent Covid infection in August 2023 who presented to [facility] ED 9/30 with SOB, cough, wheezing. Tested positive for COVID and rhinovirus  in ED. Admitted to [facility]. Started on solumedrol, duonebs, mucinex, Vit C, Zinc, remdesevir (9/28-10/2). 10/3 It sounds like dyspnea was improving in that the pt could speak in full sentences and was on [facility]  On 10/4 The pr O2 req incr to 3- 4LNC and she continued was diffusely wheezy.  PCCM was consulted in this setting     Pertinent  Medical History  COPD GAD MDD Tobacco use disorder   Significant Hospital Events: Including procedures, antibiotic start and stop dates in addition to other pertinent events   9/30 admitted + covid, + rhinovirus. started on solumedrol, remdesivir, nebs 10/2 remdesivir end  10/4 worse dyspnea. Incr O2 need to 3-4LNC. PCCM consult. Adding yupelri, brovanna, pulmicort. Paxlovid  10/5 tried some bipap to help with her WOB, helped 10/6 improved oxygenation  Interim History / Subjective:  Wore BiPAP for a few hours yesterday afternoon/evening and overnight  Improved oxygenation, now 95-100% on 4L  Reports that she feels a bit more rested Liked wearing the bipap for rest   Objective   Blood pressure (!) 166/88, pulse (!) 57, temperature 97.9 F (36.6 C), temperature source Oral, resp. rate 14, height '5\' 4"'$  (1.626 m), weight 87.3 kg, SpO2 99 %.        Intake/Output Summary (Last 24 hours) at 01/07/2022 0858 Last data filed at 01/07/2022 0757 Gross per 24 hour  Intake 1120 ml  Output 1500 ml  Net -380 ml   Filed Weights   01/05/22 0607 01/05/22 1345 01/07/22 0600  Weight: 87 kg 86.9 kg 87.3 kg    Examination: General: Ill appearing adult  F NAD  HENT: NCAT pink mm  Lungs: Improving wheeze but still L>R wheeze. Shallow respirations, no accessory use   Cardiovascular: rrr s1s2 cap refill <3sec  Abdomen: soft ndnt  Extremities: no acute joint deformity. Trace edema  Neuro: AAOx4  GU: defer   Resolved Hospital Problem list     Assessment & Plan:    Acute respiratory failure with hypoxia AECOPD Rhinovirus infection with bronchitis Recent COVID infection (did result positive on admission, though should be out of window)  Tobacco use disorder  -CTA chest no PE  P -Wean supplemental O2 for goal 88-92% -droplet precautions for rhinovirus -PRN BiPAP  -Cont steroids -on a course of paxlovid in event that there is a lingering covid component  -Yupelri, brovanna, budesonide, PRN duonebs -completing HTS nebs  -IS, flutter, mobility  -singulair, claritin, mucinex, robitussin   Hyperglycemia due to steroids Leukocytosis due to steroids  -SSI  -follow CBC PRN, trend fever curve    Best Practice (right click and "Reselect all SmartList Selections" daily)   Diet/type: Regular consistency (see orders) DVT prophylaxis: LMWH GI prophylaxis: PPI Lines: N/A Foley:  N/A Code Status:  limited Last date of multidisciplinary goals of care discussion [d/w pt, daughter, RN, primary 10/6]  Labs   CBC: Recent Labs  Lab 01/03/22 0318 01/04/22 0639 01/05/22 1033 01/06/22 0307 01/07/22 0311  WBC 16.8* 13.6* 14.0* 14.7* 18.3*  NEUTROABS 14.1* 9.6* 8.7* 11.1* 15.1*  HGB 12.2 11.9*  12.9 13.1 13.7  HCT 37.2 37.3 40.0 39.9 42.1  MCV 97.9 100.0 99.3 97.6 97.7  PLT 224 204 226 226 536    Basic Metabolic Panel: Recent Labs  Lab 01/03/22 0318 01/04/22 0639 01/05/22 1033 01/06/22 0307 01/07/22 0311  NA 141 141 139 140 141  K 4.0 3.8 3.5 3.8 3.5  CL 106 105 98 102 102  CO2 30 30 33* 31 31  GLUCOSE 134* 99 119* 117* 109*  BUN '18 17 16 14 18  '$ CREATININE 0.61 0.61 0.69 0.67 0.85  CALCIUM 8.8* 8.5* 8.4* 8.5* 9.0  MG 2.3  2.0 2.2 2.7* 2.3  PHOS 3.8 3.9 4.0 4.5 4.5   GFR: Estimated Creatinine Clearance: 73.3 mL/min (by C-G formula based on SCr of 0.85 mg/dL). Recent Labs  Lab 01/01/22 1514 01/01/22 2319 01/04/22 0639 01/05/22 1033 01/06/22 0307 01/07/22 0311  PROCALCITON <0.10  --   --   --   --   --   WBC 9.2   < > 13.6* 14.0* 14.7* 18.3*   < > = values in this interval not displayed.    Liver Function Tests: Recent Labs  Lab 01/03/22 0318 01/04/22 0639 01/05/22 1033 01/06/22 0307 01/07/22 0311  AST '19 17 20 17 15  '$ ALT '15 18 19 19 20  '$ ALKPHOS 53 50 50 54 66  BILITOT 0.5 0.4 0.4 0.5 0.6  PROT 6.2* 5.9* 6.0* 5.8* 6.2*  ALBUMIN 3.4* 3.3* 3.3* 3.2* 3.4*   No results for input(s): "LIPASE", "AMYLASE" in the last 168 hours. No results for input(s): "AMMONIA" in the last 168 hours.  ABG    Component Value Date/Time   HCO3 29.4 (H) 01/01/2022 1821   TCO2 33 (H) 01/01/2022 1522   O2SAT 68.2 01/01/2022 1821     Coagulation Profile: No results for input(s): "INR", "PROTIME" in the last 168 hours.  Cardiac Enzymes: No results for input(s): "CKTOTAL", "CKMB", "CKMBINDEX", "TROPONINI" in the last 168 hours.  HbA1C: Hgb A1c MFr Bld  Date/Time Value Ref Range Status  01/02/2022 10:33 AM 5.9 (H) 4.8 - 5.6 % Final    Comment:    (NOTE) Pre diabetes:          5.7%-6.4%  Diabetes:              >6.4%  Glycemic control for   <7.0% adults with diabetes   10/16/2020 02:07 PM 5.9 4.6 - 6.5 % Final    Comment:    Glycemic Control Guidelines for People with Diabetes:Non Diabetic:  <6%Goal of Therapy: <7%Additional Action Suggested:  >8%     CBG: Recent Labs  Lab 01/06/22 1526 01/06/22 2010 01/06/22 2332 01/07/22 0406 01/07/22 0745  GLUCAP 249* 107* 242* 110* 133*   Eliseo Gum MSN, AGACNP-BC [place] for pager  01/07/2022, 8:58 AM

## 2022-01-07 NOTE — Progress Notes (Signed)
PROGRESS NOTE    Crystal Duarte  OVZ:[id number] DOB: [birth year] DOA: 01/01/2022 PCP: [name], MD (Confirm with patient/family/NH records and if not entered, this HAS to be entered at [facility] point of entry. "No PCP" if truly none.)   Chief Complaint  Patient presents with   Shortness of Breath    Brief Narrative:   [name] PMHx COPD,Depression, HLD, Presents to the emergency department with shortness of breath.  The patient states that she had COVID in early August of this year. She presents with  sob, productive cough,. She was admitted for acute respiratory failure with hypoxia. Her RVP is positive for rhino virus. She was started on IV solumedrol, completed Remdesevir. PCCM consulted as she continues to worsen with worsening cough, sob,and diffuse wheezing despite being on steroids and neb treatments. CTA of the chest is negative for PE study.  Patient seen and examined, she reports feeling slightly better after being on the BIPAP.  No new complaints.   Assessment & Plan:   Principal Problem:   Pneumonia due to COVID-19 virus Active Problems:   Affective bipolar disorder ([place])   Severe episode of recurrent major depressive disorder, without psychotic features (HCC)   PAD (peripheral artery disease) (HCC)   Generalized anxiety disorder   Chest pain in adult   Shortness of breath   Obesity (BMI 30-39.9)   Acute bronchitis due to Rhinovirus   Acute diastolic CHF (congestive heart failure) (HCC)   Acute respiratory failure with hypoxia (HCC)   COPD exacerbation (HCC)   COVID-19   Acute respiratory failure with hypoxia probably secondary to COVID-pneumonia/rhinovirus acute bronchitis/ acute copd exacerbation.  Completed the course of remdesivir. Continue with IV Solu-Medrol 60 mg daily along with DuoNebs to every 4 hours Continue with incentive spirometry and flutter valves Transferred to stepdown and consulted PCCM as she had worsening of her symptoms.   Paxlovid ordered and prn bipap for symptomatic relief.  CTA of the chest is negative for PE, effusion and consolidation.     Generalized anxiety disorder Continue with BuSpar ,  and Ativan 1 mg twice daily, and continue wtih propranolol.    Chest pain Appears to have resolved . echocardiogram reviewed     Essential hypertension Blood pressure parameters slightly elevated , added hydralazine 25 mg TID.     Chronic diastolic heart failure Continue to monitor. CTA neg for effusion.  Does not appear to be decompensated.    Body mass index is 33.04 kg/m. Obesity:       DVT prophylaxis: (Lovenox Code Status: Partial code Family Communication: None at bedside, discussed with daughter over the phone. Disposition:   Status is: Inpatient Remains inpatient appropriate because: On BIPAP.    Level of care: Stepdown Consultants:  PCCM.  Procedures: None  Antimicrobials:  Antibiotics Given (last 72 hours)     Date/Time Action Medication Dose   01/05/22 2041 Given   nirmatrelvir/ritonavir EUA (PAXLOVID) 3 tablet 3 tablet   01/06/22 0905 Given   nirmatrelvir/ritonavir EUA (PAXLOVID) 3 tablet 3 tablet   01/06/22 2144 Given   nirmatrelvir/ritonavir EUA (PAXLOVID) 3 tablet 3 tablet   01/07/22 0953 Given   nirmatrelvir/ritonavir EUA (PAXLOVID) 3 tablet 3 tablet        Subjective: WHEEZING HAS IMPROVED.  Objective: Vitals:   01/07/22 0800 01/07/22 0844 01/07/22 0847 01/07/22 0900  BP:   (!) 168/86   Pulse: 81  84 88  Resp: 19  (!) 21 16  Temp:  97.9 F (36.6 C)  TempSrc:  Oral    SpO2: 97%  97% 94%  Weight:      Height:        Intake/Output Summary (Last 24 hours) at 01/07/2022 1002 Last data filed at 01/07/2022 0757 Gross per 24 hour  Intake 1120 ml  Output 1500 ml  Net -380 ml    Filed Weights   01/05/22 0607 01/05/22 1345 01/07/22 0600  Weight: 87 kg 86.9 kg 87.3 kg    Examination:  General exam: Ill appearing lady, not in distress.   Respiratory system: bilateral wheezing both exp and insp. On [place] oxygen, using BIPAP Cardiovascular system: S1 & S2 heard, RRR. No JVD,No pedal edema. Gastrointestinal system: Abdomen is nondistended, soft and nontender. Normal bowel sounds heard. Central nervous system: Alert and oriented. No focal neurological deficits. Extremities: Symmetric 5 x 5 power. Skin: No rashes, lesions or ulcers Psychiatry:  Mood & affect appropriate.      Data Reviewed: I have personally reviewed following labs and imaging studies  CBC: Recent Labs  Lab 01/03/22 0318 01/04/22 0639 01/05/22 1033 01/06/22 0307 01/07/22 0311  WBC 16.8* 13.6* 14.0* 14.7* 18.3*  NEUTROABS 14.1* 9.6* 8.7* 11.1* 15.1*  HGB 12.2 11.9* 12.9 13.1 13.7  HCT 37.2 37.3 40.0 39.9 42.1  MCV 97.9 100.0 99.3 97.6 97.7  PLT 224 204 226 226 264     Basic Metabolic Panel: Recent Labs  Lab 01/03/22 0318 01/04/22 0639 01/05/22 1033 01/06/22 0307 01/07/22 0311  NA 141 141 139 140 141  K 4.0 3.8 3.5 3.8 3.5  CL 106 105 98 102 102  CO2 30 30 33* 31 31  GLUCOSE 134* 99 119* 117* 109*  BUN '18 17 16 14 18  '$ CREATININE 0.61 0.61 0.69 0.67 0.85  CALCIUM 8.8* 8.5* 8.4* 8.5* 9.0  MG 2.3 2.0 2.2 2.7* 2.3  PHOS 3.8 3.9 4.0 4.5 4.5     GFR: Estimated Creatinine Clearance: 73.3 mL/min (by C-G formula based on SCr of 0.85 mg/dL).  Liver Function Tests: Recent Labs  Lab 01/03/22 0318 01/04/22 0639 01/05/22 1033 01/06/22 0307 01/07/22 0311  AST '19 17 20 17 15  '$ ALT '15 18 19 19 20  '$ ALKPHOS 53 50 50 54 66  BILITOT 0.5 0.4 0.4 0.5 0.6  PROT 6.2* 5.9* 6.0* 5.8* 6.2*  ALBUMIN 3.4* 3.3* 3.3* 3.2* 3.4*     CBG: Recent Labs  Lab 01/06/22 1526 01/06/22 2010 01/06/22 2332 01/07/22 0406 01/07/22 0745  GLUCAP 249* 107* 242* 110* 133*      Recent Results (from the past 240 hour(s))  Resp Panel by RT-PCR (Flu A&B, Covid) Anterior Nasal Swab     Status: Abnormal   Collection Time: 01/01/22 10:51 AM   Specimen: Anterior  Nasal Swab  Result Value Ref Range Status   SARS Coronavirus 2 by RT PCR POSITIVE (A) NEGATIVE Final    Comment: (NOTE) SARS-CoV-2 target nucleic acids are DETECTED.  The SARS-CoV-2 RNA is generally detectable in upper respiratory specimens during the acute phase of infection. Positive results are indicative of the presence of the identified virus, but do not rule out bacterial infection or co-infection with other pathogens not detected by the test. Clinical correlation with patient history and other diagnostic information is necessary to determine patient infection status. The expected result is Negative.  Fact Sheet for Patients: EntrepreneurPulse.com.au  Fact Sheet for Healthcare Providers: IncredibleEmployment.be  This test is not yet approved or cleared by the [place] FDA and  has been authorized for detection and/or diagnosis  of SARS-CoV-2 by FDA under an Emergency Use Authorization (EUA).  This EUA will remain in effect (meaning this test can be used) for the duration of  the COVID-19 declaration under Section 564(b)(1) of the A ct, 21 U.S.C. section 360bbb-3(b)(1), unless the authorization is terminated or revoked sooner.     Influenza A by PCR NEGATIVE NEGATIVE Final   Influenza B by PCR NEGATIVE NEGATIVE Final    Comment: (NOTE) The Xpert Xpress SARS-CoV-2/FLU/RSV plus assay is intended as an aid in the diagnosis of influenza from Nasopharyngeal swab specimens and should not be used as a sole basis for treatment. Nasal washings and aspirates are unacceptable for Xpert Xpress SARS-CoV-2/FLU/RSV testing.  Fact Sheet for Patients: EntrepreneurPulse.com.au  Fact Sheet for Healthcare Providers: IncredibleEmployment.be  This test is not yet approved or cleared by the [place] FDA and has been authorized for detection and/or diagnosis of SARS-CoV-2 by FDA under an Emergency Use  Authorization (EUA). This EUA will remain in effect (meaning this test can be used) for the duration of the COVID-19 declaration under Section 564(b)(1) of the Act, 21 U.S.C. section 360bbb-3(b)(1), unless the authorization is terminated or revoked.  Performed at [facility], [address], [place], [place] [address]   Culture, blood (Routine X 2) w Reflex to ID Panel     Status: None   Collection Time: 01/01/22  4:51 PM   Specimen: BLOOD  Result Value Ref Range Status   Specimen Description   Final    BLOOD LEFT ANTECUBITAL Performed at [facility], [place] [address] [place], [place] [address]    Special Requests   Final    BOTTLES DRAWN AEROBIC ONLY Blood Culture adequate volume Performed at [facility] [address] [place], [place] [address]    Culture   Final    NO GROWTH 5 DAYS Performed at [facility], [place] [address] [place], [place] [address]    Report Status 01/06/2022 FINAL  Final  Culture, blood (Routine X 2) w Reflex to ID Panel     Status: None   Collection Time: 01/01/22  4:51 PM   Specimen: BLOOD LEFT FOREARM  Result Value Ref Range Status   Specimen Description   Final    BLOOD LEFT FOREARM Performed at [facility] [address] [place], [place] [address]    Special Requests   Final    BOTTLES DRAWN AEROBIC ONLY Blood Culture adequate volume Performed at [facility] [address] [place], [place] [address]    Culture   Final    NO GROWTH 5 DAYS Performed at [facility], [place] [address] [place],  [address]    Report Status 01/06/2022 FINAL  Final  Respiratory (~20 pathogens) panel by PCR     Status: Abnormal   Collection Time: 01/01/22  5:31 PM   Specimen: Nasopharyngeal Swab; Respiratory  Result Value Ref Range Status   Adenovirus NOT DETECTED NOT DETECTED Final   Coronavirus 229E NOT DETECTED NOT DETECTED Final    Comment: (NOTE) The  Coronavirus on the Respiratory Panel, DOES NOT test for the novel  Coronavirus (2019 nCoV)    Coronavirus HKU1 NOT DETECTED NOT DETECTED Final   Coronavirus NL63 NOT DETECTED NOT DETECTED Final   Coronavirus OC43 NOT DETECTED NOT DETECTED Final   Metapneumovirus NOT DETECTED NOT DETECTED Final   Rhinovirus / Enterovirus DETECTED (A) NOT DETECTED Final   Influenza A NOT DETECTED NOT DETECTED Final  Influenza B NOT DETECTED NOT DETECTED Final   Parainfluenza Virus 1 NOT DETECTED NOT DETECTED Final   Parainfluenza Virus 2 NOT DETECTED NOT DETECTED Final   Parainfluenza Virus 3 NOT DETECTED NOT DETECTED Final   Parainfluenza Virus 4 NOT DETECTED NOT DETECTED Final   Respiratory Syncytial Virus NOT DETECTED NOT DETECTED Final   Bordetella pertussis NOT DETECTED NOT DETECTED Final   Bordetella Parapertussis NOT DETECTED NOT DETECTED Final   Chlamydophila pneumoniae NOT DETECTED NOT DETECTED Final   Mycoplasma pneumoniae NOT DETECTED NOT DETECTED Final    Comment: Performed at [facility], [place] [address] [place], [place] [address]  MRSA Next Gen by PCR, Nasal     Status: None   Collection Time: 01/01/22  7:02 PM   Specimen: Nasal Mucosa; Nasal Swab  Result Value Ref Range Status   MRSA by PCR Next Gen NOT DETECTED NOT DETECTED Final    Comment: (NOTE) The GeneXpert MRSA Assay (FDA approved for NASAL specimens only), is one component of a comprehensive MRSA colonization surveillance program. It is not intended to diagnose MRSA infection nor to guide or monitor treatment for MRSA infections. Test performance is not FDA approved in patients less than 15 years old. Performed at [facility], [place] [address] [place], [place] [address]   Culture, blood (Routine X 2) w Reflex to ID Panel     Status: None   Collection Time: 01/01/22  7:37 PM   Specimen: BLOOD LEFT HAND  Result Value Ref Range Status   Specimen Description   Final    BLOOD LEFT HAND Performed at  [facility] [address] [place], [place] [address]    Special Requests   Final    BOTTLES DRAWN AEROBIC AND ANAEROBIC Blood Culture adequate volume Performed at [facility] [address] [place], [place] [address]    Culture   Final    NO GROWTH 5 DAYS Performed at [facility], [place] [address], [place], [place] [address]    Report Status 01/06/2022 FINAL  Final  Culture, blood (Routine X 2) w Reflex to ID Panel     Status: None   Collection Time: 01/01/22  7:44 PM   Specimen: BLOOD RIGHT HAND  Result Value Ref Range Status   Specimen Description   Final    BLOOD RIGHT HAND Performed at [facility] [address], [place], [place] [address]    Special Requests   Final    BOTTLES DRAWN AEROBIC AND ANAEROBIC Blood Culture adequate volume Performed at [facility] [address] [place], [place] [address]    Culture   Final    NO GROWTH 5 DAYS Performed at [facility], [place] [address] [place], [place] [address]    Report Status 01/06/2022 FINAL  Final         Radiology Studies: CT Angio Chest Pulmonary Embolism (PE) W or WO Contrast  Result Date: 01/05/2022 CLINICAL DATA:  High clinical suspicion for pulmonary embolism EXAM: CT ANGIOGRAPHY CHEST WITH CONTRAST TECHNIQUE: Multidetector CT imaging of the chest was performed using the standard protocol during bolus administration of intravenous contrast. Multiplanar CT image reconstructions and MIPs were obtained to evaluate the vascular anatomy. RADIATION DOSE REDUCTION: This exam was performed according to the departmental dose-optimization program which includes automated exposure control, adjustment of the mA and/or kV according to patient size and/or use of iterative reconstruction technique. CONTRAST:  100m OMNIPAQUE IOHEXOL  350 MG/ML SOLN COMPARISON:  10/15/2021 FINDINGS: Cardiovascular: There is homogeneous enhancement in  thoracic aorta. Left vertebral artery is arising from the aortic arch. There are no intraluminal filling defects in pulmonary artery branches. Mediastinum/Nodes: No significant lymphadenopathy seen. Lungs/Pleura: There is no focal pulmonary consolidation. Surgical staples are seen in right upper lung field. No discrete lung nodules are seen. There is no pleural effusion or pneumothorax. Upper Abdomen: There is fatty infiltration in liver. Musculoskeletal: No acute findings are seen. Review of the MIP images confirms the above findings. IMPRESSION: There is no evidence of pulmonary artery embolism. There is no evidence of thoracic aortic dissection. There is no focal pulmonary consolidation. Fatty liver. Electronically Signed   By: Elmer Picker M.D.   On: 01/05/2022 18:14        Scheduled Meds:  arformoterol  15 mcg Nebulization BID   ascorbic acid  500 mg Oral Daily   budesonide (PULMICORT) nebulizer solution  0.25 mg Nebulization BID   busPIRone  2.5 mg Oral Daily   [START ON 01/14/2022] busPIRone  20 mg Oral TID   Chlorhexidine Gluconate Cloth  6 each Topical Daily   dextromethorphan-guaiFENesin  1 tablet Oral BID   enoxaparin (LOVENOX) injection  40 mg Subcutaneous Q24H   gabapentin  400 mg Oral BID   gabapentin  600 mg Oral QHS   hydrALAZINE  25 mg Oral Q8H   influenza vac split quadrivalent PF  0.5 mL Intramuscular Tomorrow-1000   insulin aspart  0-15 Units Subcutaneous Q4H   loratadine  10 mg Oral Daily   methylPREDNISolone (SOLU-MEDROL) injection  60 mg Intravenous Daily   montelukast  10 mg Oral QHS   nirmatrelvir/ritonavir EUA  3 tablet Oral BID   mouth rinse  15 mL Mouth Rinse 4 times per day   pantoprazole  40 mg Oral Daily   propranolol  10 mg Oral TID   revefenacin  175 mcg Nebulization Daily   traZODone  250 mg Oral QHS   venlafaxine XR  150 mg Oral Q breakfast   venlafaxine XR  75 mg Oral Q breakfast   zinc sulfate  220 mg Oral Daily   Continuous Infusions:    LOS: 6 days        Hosie Poisson, MD [facility]   To contact the attending provider between 7A-7P or the covering provider during after hours 7P-7A, please log into the web site [email or web address] and access using universal [place] password for that web site. If you do not have the password, please call the hospital operator.  01/07/2022, 10:02 AM

## 2022-01-07 NOTE — Evaluation (Signed)
Physical Therapy Evaluation Patient Details Name: Crystal Duarte MRN: [id number] DOB: 1960-08-31 Today's Date: 01/07/2022  History of Present Illness  [name] PMHx COPD,Depression, HLD, Presents to the emergency department with shortness of breath.  The patient states that she had COVID in early August of this year. She presents with  sob, productive cough,. She was admitted for acute respiratory failure with hypoxia. Her RVP is positive for rhino virus.  Clinical Impression  Patient admitted for above medical problems.  Patient  eager to mobilize, ambulated with RW x 25' then 10' without RW and  close guarding. Gait unsteady without RW.  Patient ambulated on 4 L [place] with Spo2 remaining >92  %.  RR  remained in 20'.  Patient expresses concern for being home alone and family has other obligations. Patient interested in short rehab unless progresses well enough to return home alone.  Pt admitted with above diagnosis.  Pt currently with functional limitations due to the deficits listed below (see PT Problem List). Pt will benefit from skilled PT to increase their independence and safety with mobility to allow discharge to the venue listed below.        Recommendations for follow up therapy are one component of a multi-disciplinary discharge planning process, led by the attending physician.  Recommendations may be updated based on patient status, additional functional criteria and insurance authorization.  Follow Up Recommendations Skilled nursing-short term rehab (<3 hours/day) Can patient physically be transported by private vehicle: No    Assistance Recommended at Discharge    Patient can return home with the following  A little help with walking and/or transfers;Help with stairs or ramp for entrance;A little help with bathing/dressing/bathroom;Assistance with cooking/housework;Assist for transportation    Equipment Recommendations Rolling walker (2 wheels)  Recommendations for  Other Services  OT consult    Functional Status Assessment Patient has had a recent decline in their functional status and demonstrates the ability to make significant improvements in function in a reasonable and predictable amount of time.     Precautions / Restrictions Precautions Precautions: Fall Precaution Comments: droplet, on 4 L      Mobility  Bed Mobility Overal bed mobility: Needs Assistance Bed Mobility: Supine to Sit     Supine to sit: Min guard     General bed mobility comments: assist with lines and tubes    Transfers Overall transfer level: Needs assistance Equipment used: Rolling walker (2 wheels) Transfers: Sit to/from Stand Sit to Stand: Min guard           General transfer comment: no assist at [facility] for support    Ambulation/Gait Ambulation/Gait assistance: Min assist, Min guard Gait Distance (Feet): 25 Feet Assistive device: Rolling walker (2 wheels) Gait Pattern/deviations: Step-through pattern, Shuffle Gait velocity: decr     General Gait Details: 44' w/RW then 10' without Rw  Stairs            Wheelchair Mobility    Modified Rankin (Stroke Patients Only)       Balance Overall balance assessment: Needs assistance Sitting-balance support: No upper extremity supported Sitting balance-Leahy Scale: Fair     Standing balance support: During functional activity, Bilateral upper extremity supported, No upper extremity supported Standing balance-Leahy Scale: Fair                               Pertinent Vitals/Pain Pain Assessment Pain Assessment: No/denies pain    Home Living Family/patient expects  to be discharged to:: Private residence Living Arrangements: Alone Available Help at Discharge: Family;Available PRN/intermittently Type of Home: Apartment Home Access: Level entry       Home Layout: One level Home Equipment: None;Grab bars - tub/shower      Prior Function Prior Level of Function :  Independent/Modified Independent                     Hand Dominance   Dominant Hand: Right    Extremity/Trunk Assessment   Upper Extremity Assessment Upper Extremity Assessment: Generalized weakness    Lower Extremity Assessment Lower Extremity Assessment: Generalized weakness    Cervical / Trunk Assessment Cervical / Trunk Assessment: Normal  Communication   Communication: No difficulties  Cognition Arousal/Alertness: Awake/alert Behavior During Therapy: WFL for tasks assessed/performed Overall Cognitive Status: Within Functional Limits for tasks assessed                                 General Comments: sluggish        General Comments      Exercises     Assessment/Plan    PT Assessment Patient needs continued PT services  PT Problem List Decreased strength;Decreased mobility;Decreased activity tolerance;Cardiopulmonary status limiting activity;Decreased knowledge of use of DME       PT Treatment Interventions DME instruction;Therapeutic activities;Gait training;Therapeutic exercise;Patient/family education;Functional mobility training    PT Goals (Current goals can be found in the Care Plan section)  Acute Rehab PT Goals Patient Stated Goal: I want to go home PT Goal Formulation: With patient Time For Goal Achievement: 01/21/22 Potential to Achieve Goals: Good    Frequency Min 3X/week     Co-evaluation               AM-PAC PT "6 Clicks" Mobility  Outcome Measure Help needed turning from your back to your side while in a flat bed without using bedrails?: A Little Help needed moving from lying on your back to sitting on the side of a flat bed without using bedrails?: A Little Help needed moving to and from a bed to a chair (including a wheelchair)?: A Little Help needed standing up from a chair using your arms (e.g., wheelchair or bedside chair)?: A Little Help needed to walk in hospital room?: A Lot Help needed climbing  3-5 steps with a railing? : Total 6 Click Score: 15    End of Session Equipment Utilized During Treatment: Gait belt;Oxygen Activity Tolerance: Patient tolerated treatment well Patient left: in chair;with call bell/phone within reach;with chair alarm set Nurse Communication: Mobility status PT Visit Diagnosis: Difficulty in walking, not elsewhere classified (R26.2)    Time: 7672-0947 PT Time Calculation (min) (ACUTE ONLY): 29 min   Charges:   PT Evaluation $PT Eval Low Complexity: 1 Low PT Treatments $Gait Training: 8-22 mins        [facility] Office [phone] Weekend pager-[phone]   Claretha Cooper 01/07/2022, 3:41 PM

## 2022-01-08 ENCOUNTER — Telehealth: Payer: Self-pay | Admitting: Student

## 2022-01-08 DIAGNOSIS — U071 COVID-19: Secondary | ICD-10-CM | POA: Diagnosis not present

## 2022-01-08 DIAGNOSIS — F310 Bipolar disorder, current episode hypomanic: Secondary | ICD-10-CM | POA: Diagnosis not present

## 2022-01-08 DIAGNOSIS — I5031 Acute diastolic (congestive) heart failure: Secondary | ICD-10-CM | POA: Diagnosis not present

## 2022-01-08 DIAGNOSIS — J1282 Pneumonia due to coronavirus disease 2019: Secondary | ICD-10-CM | POA: Diagnosis not present

## 2022-01-08 DIAGNOSIS — J206 Acute bronchitis due to rhinovirus: Secondary | ICD-10-CM | POA: Diagnosis not present

## 2022-01-08 LAB — COMPREHENSIVE METABOLIC PANEL
ALT: 17 U/L (ref 0–44)
AST: 15 U/L (ref 15–41)
Albumin: 3.1 g/dL — ABNORMAL LOW (ref 3.5–5.0)
Alkaline Phosphatase: 55 U/L (ref 38–126)
Anion gap: 8 (ref 5–15)
BUN: 16 mg/dL (ref 8–23)
CO2: 32 mmol/L (ref 22–32)
Calcium: 8.6 mg/dL — ABNORMAL LOW (ref 8.9–10.3)
Chloride: 98 mmol/L (ref 98–111)
Creatinine, Ser: 0.79 mg/dL (ref 0.44–1.00)
GFR, Estimated: >60 mL/min (ref >60)
Glucose, Bld: 160 mg/dL — ABNORMAL HIGH (ref 70–99)
Potassium: 3.3 mmol/L — ABNORMAL LOW (ref 3.5–5.1)
Sodium: 138 mmol/L (ref 135–145)
Total Bilirubin: 0.5 mg/dL (ref 0.3–1.2)
Total Protein: 5.7 g/dL — ABNORMAL LOW (ref 6.5–8.1)

## 2022-01-08 LAB — MAGNESIUM: Magnesium: 2.2 mg/dL (ref 1.7–2.4)

## 2022-01-08 LAB — GLUCOSE, CAPILLARY
Glucose-Capillary: 131 mg/dL — ABNORMAL HIGH (ref 70–99)
Glucose-Capillary: 154 mg/dL — ABNORMAL HIGH (ref 70–99)
Glucose-Capillary: 133 mg/dL — ABNORMAL HIGH (ref 70–99)
Glucose-Capillary: 194 mg/dL — ABNORMAL HIGH (ref 70–99)
Glucose-Capillary: 195 mg/dL — ABNORMAL HIGH (ref 70–99)
Glucose-Capillary: 245 mg/dL — ABNORMAL HIGH (ref 70–99)
Glucose-Capillary: 115 mg/dL — ABNORMAL HIGH (ref 70–99)

## 2022-01-08 LAB — FERRITIN: Ferritin: 76 ng/mL (ref 11–307)

## 2022-01-08 LAB — D-DIMER, QUANTITATIVE: D-Dimer, Quant: 0.37 ug/mL-FEU (ref 0.00–0.50)

## 2022-01-08 LAB — LACTATE DEHYDROGENASE: LDH: 147 U/L (ref 98–192)

## 2022-01-08 MED ORDER — FLUTICASONE PROPIONATE 50 MCG/ACT NA SUSP
2.0000 | Freq: Every day | NASAL | Status: DC
  Administered 2022-01-08 – 2022-01-15 (×8): 2 via NASAL
  Filled 2022-01-08: qty 16

## 2022-01-08 MED ORDER — OXYMETAZOLINE HCL 0.05 % NA SOLN
1.0000 | Freq: Two times a day (BID) | NASAL | Status: AC
  Administered 2022-01-08 – 2022-01-10 (×6): 1 via NASAL
  Filled 2022-01-08: qty 15

## 2022-01-08 MED ORDER — POTASSIUM CHLORIDE CRYS ER 20 MEQ PO TBCR
40.0000 meq | EXTENDED_RELEASE_TABLET | Freq: Once | ORAL | Status: AC
  Administered 2022-01-08: 40 meq via ORAL
  Filled 2022-01-08: qty 2

## 2022-01-08 MED ORDER — PREDNISONE 20 MG PO TABS
40.0000 mg | ORAL_TABLET | Freq: Every day | ORAL | Status: AC
  Administered 2022-01-09 – 2022-01-11 (×3): 40 mg via ORAL
  Filled 2022-01-08 (×3): qty 2

## 2022-01-08 MED ORDER — ENOXAPARIN SODIUM 40 MG/0.4ML IJ SOSY
40.0000 mg | PREFILLED_SYRINGE | INTRAMUSCULAR | Status: DC
  Administered 2022-01-08 – 2022-01-14 (×7): 40 mg via SUBCUTANEOUS
  Filled 2022-01-08 (×8): qty 0.4

## 2022-01-08 NOTE — Progress Notes (Signed)
PROGRESS NOTE    Crystal Duarte  XVQ:[id number] DOB: 1961/02/01 DOA: 01/01/2022 PCP: [name], MD    Chief Complaint  Patient presents with   Shortness of Breath    Brief Narrative:   [name] PMHx COPD,Depression, HLD, Presents to the emergency department with shortness of breath.  The patient states that she had COVID in early August of this year. She presents with  sob, productive cough,. She was admitted for acute respiratory failure with hypoxia. Her RVP is positive for rhino virus. She was started on IV solumedrol, completed Remdesevir. PCCM consulted as she continues to worsen with worsening cough, sob,and diffuse wheezing despite being on steroids and neb treatments. CTA of the chest is negative for PE study.  Patient seen and examined,  she reports feeling slightly better after being on the BIPAP. She also reports nasal congestion.  No chest pain.    Assessment & Plan:   Principal Problem:   Pneumonia due to COVID-19 virus Active Problems:   Affective bipolar disorder ([place])   Severe episode of recurrent major depressive disorder, without psychotic features (HCC)   PAD (peripheral artery disease) (HCC)   Generalized anxiety disorder   Chest pain in adult   Shortness of breath   Obesity (BMI 30-39.9)   Acute bronchitis due to Rhinovirus   Acute diastolic CHF (congestive heart failure) (HCC)   Acute respiratory failure with hypoxia (HCC)   COPD exacerbation (HCC)   COVID-19   Acute respiratory failure with hypoxia probably secondary to COVID-pneumonia/rhinovirus acute bronchitis/ acute copd exacerbation.  Completed the course of remdesivir.  She had been on IV solumedrol 60 mg daily, wheezing and breathing has improved. Transition to oral prednisone from tomorrow.  Continue with incentive spirometry and flutter valves Transferred to stepdown and consulted PCCM as she had worsening of her symptoms.  Paxlovid ordered and prn bipap for symptomatic  relief.  CTA of the chest is negative for PE, effusion and consolidation.  She continues to improve.     Generalized anxiety disorder Continue with BuSpar ,  and Ativan 1 mg twice daily, and continue wtih propranolol.    Chest pain Appears to have resolved . echocardiogram reviewed     Essential hypertension Blood pressure parameters slightly elevated , added hydralazine 25 mg TID.     Chronic diastolic heart failure Continue to monitor. CTA neg for effusion.  Does not appear to be decompensated.    Body mass index is 33.04 kg/m. Obesity:    Hypokalemia:  Replaced.     Nasal congestion;  Added Afrin spray.   Leukocytosis:  Possibly from steroids.    DVT prophylaxis: (Lovenox Code Status: Partial code Family Communication: None at bedside, discussed with daughter over the phone today. Disposition:   Status is: Inpatient Remains inpatient appropriate because: pending improvement.     Level of care: Stepdown Consultants:  PCCM.  Procedures: None  Antimicrobials:  Antibiotics Given (last 72 hours)     Date/Time Action Medication Dose   01/05/22 2041 Given   nirmatrelvir/ritonavir EUA (PAXLOVID) 3 tablet 3 tablet   01/06/22 0905 Given   nirmatrelvir/ritonavir EUA (PAXLOVID) 3 tablet 3 tablet   01/06/22 2144 Given   nirmatrelvir/ritonavir EUA (PAXLOVID) 3 tablet 3 tablet   01/07/22 0953 Given   nirmatrelvir/ritonavir EUA (PAXLOVID) 3 tablet 3 tablet   01/07/22 2124 Given   nirmatrelvir/ritonavir EUA (PAXLOVID) 3 tablet 3 tablet   01/08/22 1008 Given   nirmatrelvir/ritonavir EUA (PAXLOVID) 3 tablet 3 tablet  Subjective: Breathing and wheezing has improved. She is off oxygen and eating breakfast. Oxygen sats around 90% on RA.   Objective: Vitals:   01/08/22 1030 01/08/22 1200 01/08/22 1257 01/08/22 1300  BP: (!) 176/153 139/73    Pulse: (!) 108 75 74 73  Resp: (!) 29 18 (!) 22 17  Temp:   98.4 F (36.9 C)   TempSrc:   Oral    SpO2: 94% 90% (!) 89% (!) 89%  Weight:      Height:        Intake/Output Summary (Last 24 hours) at 01/08/2022 1445 Last data filed at 01/08/2022 1300 Gross per 24 hour  Intake 350 ml  Output 2300 ml  Net -1950 ml    Filed Weights   01/05/22 0607 01/05/22 1345 01/07/22 0600  Weight: 87 kg 86.9 kg 87.3 kg    Examination:  General exam:elderly woman, on RA not in distress.  Respiratory system: Clear to auscultation. Respiratory effort normal. Cardiovascular system: S1 & S2 heard,tachycardic.  No pedal edema. Gastrointestinal system: Abdomen is nondistended, soft and nontender. Normal bowel sounds heard. Central nervous system: Alert and oriented. No focal neurological deficits. Extremities: Symmetric 5 x 5 power. Skin: No rashes Psychiatry: Mood & affect appropriate.      Data Reviewed: I have personally reviewed following labs and imaging studies  CBC: Recent Labs  Lab 01/03/22 0318 01/04/22 0639 01/05/22 1033 01/06/22 0307 01/07/22 0311  WBC 16.8* 13.6* 14.0* 14.7* 18.3*  NEUTROABS 14.1* 9.6* 8.7* 11.1* 15.1*  HGB 12.2 11.9* 12.9 13.1 13.7  HCT 37.2 37.3 40.0 39.9 42.1  MCV 97.9 100.0 99.3 97.6 97.7  PLT 224 204 226 226 264     Basic Metabolic Panel: Recent Labs  Lab 01/03/22 0318 01/04/22 0639 01/05/22 1033 01/06/22 0307 01/07/22 0311 01/08/22 0304  NA 141 141 139 140 141 138  K 4.0 3.8 3.5 3.8 3.5 3.3*  CL 106 105 98 102 102 98  CO2 30 30 33* 31 31 32  GLUCOSE 134* 99 119* 117* 109* 160*  BUN '18 17 16 14 18 16  '$ CREATININE 0.61 0.61 0.69 0.67 0.85 0.79  CALCIUM 8.8* 8.5* 8.4* 8.5* 9.0 8.6*  MG 2.3 2.0 2.2 2.7* 2.3 2.2  PHOS 3.8 3.9 4.0 4.5 4.5  --      GFR: Estimated Creatinine Clearance: 77.9 mL/min (by C-G formula based on SCr of 0.79 mg/dL).  Liver Function Tests: Recent Labs  Lab 01/04/22 0639 01/05/22 1033 01/06/22 0307 01/07/22 0311 01/08/22 0304  AST '17 20 17 15 15  '$ ALT '18 19 19 20 17  '$ ALKPHOS 50 50 54 66 55  BILITOT 0.4  0.4 0.5 0.6 0.5  PROT 5.9* 6.0* 5.8* 6.2* 5.7*  ALBUMIN 3.3* 3.3* 3.2* 3.4* 3.1*     CBG: Recent Labs  Lab 01/07/22 2029 01/07/22 2358 01/08/22 0313 01/08/22 0811 01/08/22 1200  GLUCAP 197* 131* 154* 133* 194*      Recent Results (from the past 240 hour(s))  Resp Panel by RT-PCR (Flu A&B, Covid) Anterior Nasal Swab     Status: Abnormal   Collection Time: 01/01/22 10:51 AM   Specimen: Anterior Nasal Swab  Result Value Ref Range Status   SARS Coronavirus 2 by RT PCR POSITIVE (A) NEGATIVE Final    Comment: (NOTE) SARS-CoV-2 target nucleic acids are DETECTED.  The SARS-CoV-2 RNA is generally detectable in upper respiratory specimens during the acute phase of infection. Positive results are indicative of the presence of the identified virus, but do not  rule out bacterial infection or co-infection with other pathogens not detected by the test. Clinical correlation with patient history and other diagnostic information is necessary to determine patient infection status. The expected result is Negative.  Fact Sheet for Patients: EntrepreneurPulse.com.au  Fact Sheet for Healthcare Providers: IncredibleEmployment.be  This test is not yet approved or cleared by the [place] FDA and  has been authorized for detection and/or diagnosis of SARS-CoV-2 by FDA under an Emergency Use Authorization (EUA).  This EUA will remain in effect (meaning this test can be used) for the duration of  the COVID-19 declaration under Section 564(b)(1) of the A ct, 21 U.S.C. section 360bbb-3(b)(1), unless the authorization is terminated or revoked sooner.     Influenza A by PCR NEGATIVE NEGATIVE Final   Influenza B by PCR NEGATIVE NEGATIVE Final    Comment: (NOTE) The Xpert Xpress SARS-CoV-2/FLU/RSV plus assay is intended as an aid in the diagnosis of influenza from Nasopharyngeal swab specimens and should not be used as a sole basis for treatment. Nasal  washings and aspirates are unacceptable for Xpert Xpress SARS-CoV-2/FLU/RSV testing.  Fact Sheet for Patients: EntrepreneurPulse.com.au  Fact Sheet for Healthcare Providers: IncredibleEmployment.be  This test is not yet approved or cleared by the [place] FDA and has been authorized for detection and/or diagnosis of SARS-CoV-2 by FDA under an Emergency Use Authorization (EUA). This EUA will remain in effect (meaning this test can be used) for the duration of the COVID-19 declaration under Section 564(b)(1) of the Act, 21 U.S.C. section 360bbb-3(b)(1), unless the authorization is terminated or revoked.  Performed at [facility], [address], [place], [place] [address]   Culture, blood (Routine X 2) w Reflex to ID Panel     Status: None   Collection Time: 01/01/22  4:51 PM   Specimen: BLOOD  Result Value Ref Range Status   Specimen Description   Final    BLOOD LEFT ANTECUBITAL Performed at [facility], [place] [address], [place], [place] [address]    Special Requests   Final    BOTTLES DRAWN AEROBIC ONLY Blood Culture adequate volume Performed at [facility] [address] [place], [place] [address]    Culture   Final    NO GROWTH 5 DAYS Performed at [facility], [place] [address] [place], [place] [address]    Report Status 01/06/2022 FINAL  Final  Culture, blood (Routine X 2) w Reflex to ID Panel     Status: None   Collection Time: 01/01/22  4:51 PM   Specimen: BLOOD LEFT FOREARM  Result Value Ref Range Status   Specimen Description   Final    BLOOD LEFT FOREARM Performed at [facility] [address] [place], [place] [address]    Special Requests   Final    BOTTLES DRAWN AEROBIC ONLY Blood Culture adequate volume Performed at [facility] [address] [place], [place] [address]    Culture   Final    NO GROWTH 5 DAYS Performed  at [facility], [place] [address] [place], [place] [address]    Report Status 01/06/2022 FINAL  Final  Respiratory (~20 pathogens) panel by PCR     Status: Abnormal   Collection Time: 01/01/22  5:31 PM   Specimen: Nasopharyngeal Swab; Respiratory  Result Value Ref Range Status   Adenovirus NOT DETECTED NOT DETECTED Final   Coronavirus 229E NOT DETECTED NOT DETECTED Final    Comment: (NOTE)  The Coronavirus on the Respiratory Panel, DOES NOT test for the novel  Coronavirus (2019 nCoV)    Coronavirus HKU1 NOT DETECTED NOT DETECTED Final   Coronavirus NL63 NOT DETECTED NOT DETECTED Final   Coronavirus OC43 NOT DETECTED NOT DETECTED Final   Metapneumovirus NOT DETECTED NOT DETECTED Final   Rhinovirus / Enterovirus DETECTED (A) NOT DETECTED Final   Influenza A NOT DETECTED NOT DETECTED Final   Influenza B NOT DETECTED NOT DETECTED Final   Parainfluenza Virus 1 NOT DETECTED NOT DETECTED Final   Parainfluenza Virus 2 NOT DETECTED NOT DETECTED Final   Parainfluenza Virus 3 NOT DETECTED NOT DETECTED Final   Parainfluenza Virus 4 NOT DETECTED NOT DETECTED Final   Respiratory Syncytial Virus NOT DETECTED NOT DETECTED Final   Bordetella pertussis NOT DETECTED NOT DETECTED Final   Bordetella Parapertussis NOT DETECTED NOT DETECTED Final   Chlamydophila pneumoniae NOT DETECTED NOT DETECTED Final   Mycoplasma pneumoniae NOT DETECTED NOT DETECTED Final    Comment: Performed at [facility], [place] [address], [place], [place] [address]  MRSA Next Gen by PCR, Nasal     Status: None   Collection Time: 01/01/22  7:02 PM   Specimen: Nasal Mucosa; Nasal Swab  Result Value Ref Range Status   MRSA by PCR Next Gen NOT DETECTED NOT DETECTED Final    Comment: (NOTE) The GeneXpert MRSA Assay (FDA approved for NASAL specimens only), is one component of a comprehensive MRSA colonization surveillance program. It is not intended to diagnose MRSA infection nor to guide or monitor treatment for MRSA  infections. Test performance is not FDA approved in patients less than 27 years old. Performed at [facility], [place] [address] [place], [place] [address]   Culture, blood (Routine X 2) w Reflex to ID Panel     Status: None   Collection Time: 01/01/22  7:37 PM   Specimen: BLOOD LEFT HAND  Result Value Ref Range Status   Specimen Description   Final    BLOOD LEFT HAND Performed at [facility] [address] [place], [place] [address]    Special Requests   Final    BOTTLES DRAWN AEROBIC AND ANAEROBIC Blood Culture adequate volume Performed at [facility] [address], [place], [place] [address]    Culture   Final    NO GROWTH 5 DAYS Performed at [facility], [place] [address], [place], [place] [address]    Report Status 01/06/2022 FINAL  Final  Culture, blood (Routine X 2) w Reflex to ID Panel     Status: None   Collection Time: 01/01/22  7:44 PM   Specimen: BLOOD RIGHT HAND  Result Value Ref Range Status   Specimen Description   Final    BLOOD RIGHT HAND Performed at [facility] [address] [place], [place] [address]    Special Requests   Final    BOTTLES DRAWN AEROBIC AND ANAEROBIC Blood Culture adequate volume Performed at [facility] [address], [place], [place] [address]    Culture   Final    NO GROWTH 5 DAYS Performed at [facility], [place] [address] [place], [place] [address]    Report Status 01/06/2022 FINAL  Final         Radiology Studies: No results found.      Scheduled Meds:  arformoterol  15 mcg Nebulization BID   ascorbic acid  500 mg Oral Daily  budesonide (PULMICORT) nebulizer solution  0.25 mg Nebulization BID   busPIRone  2.5 mg Oral Daily   [START ON 01/14/2022] busPIRone  20 mg Oral TID   Chlorhexidine Gluconate Cloth  6 each Topical Daily   dextromethorphan-guaiFENesin  1 tablet Oral BID   enoxaparin (LOVENOX)  injection  40 mg Subcutaneous Q24H   fluticasone  2 spray Each Nare Daily   gabapentin  400 mg Oral BID   gabapentin  600 mg Oral QHS   hydrALAZINE  25 mg Oral Q8H   influenza vac split quadrivalent PF  0.5 mL Intramuscular Tomorrow-1000   insulin aspart  0-15 Units Subcutaneous Q4H   loratadine  10 mg Oral Daily   methylPREDNISolone (SOLU-MEDROL) injection  60 mg Intravenous Daily   montelukast  10 mg Oral QHS   nirmatrelvir/ritonavir EUA  3 tablet Oral BID   mouth rinse  15 mL Mouth Rinse 4 times per day   oxymetazoline  1 spray Each Nare BID   pantoprazole  40 mg Oral Daily   propranolol  10 mg Oral TID   revefenacin  175 mcg Nebulization Daily   traZODone  250 mg Oral QHS   venlafaxine XR  150 mg Oral Q breakfast   venlafaxine XR  75 mg Oral Q breakfast   zinc sulfate  220 mg Oral Daily   Continuous Infusions:   LOS: 7 days        Hosie Poisson, MD [facility]   To contact the attending provider between 7A-7P or the covering provider during after hours 7P-7A, please log into the web site [email or web address] and access using universal  password for that web site. If you do not have the password, please call the hospital operator.  01/08/2022, 2:45 PM

## 2022-01-08 NOTE — Telephone Encounter (Signed)
Can we set her up for clinic follow up with Dr. Annamaria Boots or Derl Barrow in 4-6 weeks?

## 2022-01-08 NOTE — Progress Notes (Addendum)
NAME:  Crystal Duarte, MRN:  [id number], DOB:  [birth year], LOS: 7 ADMISSION DATE:  01/01/2022, CONSULTATION DATE:  01/05/22 REFERRING MD:  Karleen Hampshire - [facility] , CHIEF COMPLAINT:  SOB    History of Present Illness:  61 yo f PMH tobacco use disorder, COPD, coccidioidomycosis, GAD, MDD, recent Covid infection in August 2023 who presented to [facility] ED 9/30 with SOB, cough, wheezing. Tested positive for COVID and rhinovirus  in ED. Admitted to [facility]. Started on solumedrol, duonebs, mucinex, Vit C, Zinc, remdesevir (9/28-10/2). 10/3 It sounds like dyspnea was improving in that the pt could speak in full sentences and was on [facility]  On 10/4 The pr O2 req incr to 3- 4LNC and she continued was diffusely wheezy.  PCCM was consulted in this setting     Pertinent  Medical History  COPD GAD MDD Tobacco use disorder   Significant Hospital Events: Including procedures, antibiotic start and stop dates in addition to other pertinent events   9/30 admitted + covid, + rhinovirus. started on solumedrol, remdesivir, nebs 10/2 remdesivir end  10/4 worse dyspnea. Incr O2 need to 3-4LNC. PCCM consult. Adding yupelri, brovanna, pulmicort. Paxlovid  10/5 tried some bipap to help with her WOB, helped 10/6 improved oxygenation  Interim History / Subjective:  Didn't wear bipap much at all, if any, last night. Looks more comfortable/rested this morning.   Objective   Blood pressure 134/65, pulse (!) 59, temperature 98.7 F (37.1 C), temperature source Oral, resp. rate 13, height '5\' 4"'$  (1.626 m), weight 87.3 kg, SpO2 99 %.    FiO2 (%):  [30 %] 30 %   Intake/Output Summary (Last 24 hours) at 01/08/2022 0810 Last data filed at 01/08/2022 0400 Gross per 24 hour  Intake 350 ml  Output 2700 ml  Net -2350 ml   Filed Weights   01/05/22 0607 01/05/22 1345 01/07/22 0600  Weight: 87 kg 86.9 kg 87.3 kg    Examination: General appearance: 61 y.o., female, NAD Eyes: tracking appropriately HENT: NCAT; dry MM Lungs: faint  exp wheeze bl, with normal respiratory effort CV: RRR, no murmur  Abdomen: Soft, non-tender; non-distended, BS present  Extremities: No peripheral edema, warm Neuro: grossly nonfocal   Labs/imaging reviewed: BMP stable  No new imaging  Resolved Hospital Problem list     Assessment & Plan:    Acute respiratory failure with hypoxia AECOPD Rhinovirus infection with bronchitis Recent COVID infection (did result positive on admission, though should be out of window)  Tobacco use disorder  P -Wean supplemental O2 for goal 88-92%, walking oximetry prior to discharge -droplet precautions for rhinovirus -BiPAP prn, she is interested in discussing role of NIV for COPD with chronic hypercapnic respiratory failure in her follow up with Dr. Annamaria Boots as an outpatient  -Cont steroid, taper over 1-2 weeks -on a course of paxlovid in event that there is a lingering covid component  -Yupelri, brovana, budesonide, PRN duonebs while here. At home prescribe brovana bid, pulmicort bid (needs to be reminded to rinse mouth/gargle after use), duonebs prn -IS, flutter, mobility  -singulair, claritin, mucinex, robitussin  -will arrange follow up in pulmonary clinic  Hyperglycemia due to steroids Leukocytosis due to steroids  -SSI  -follow CBC PRN, trend fever curve   Will sign off but glad to be reinvolved as condition changes   Best Practice (right click and "Reselect all SmartList Selections" daily)   Per [place] for pager  01/08/2022, 8:10 AM

## 2022-01-09 DIAGNOSIS — U071 COVID-19: Secondary | ICD-10-CM | POA: Diagnosis not present

## 2022-01-09 DIAGNOSIS — F310 Bipolar disorder, current episode hypomanic: Secondary | ICD-10-CM | POA: Diagnosis not present

## 2022-01-09 DIAGNOSIS — I5031 Acute diastolic (congestive) heart failure: Secondary | ICD-10-CM | POA: Diagnosis not present

## 2022-01-09 DIAGNOSIS — J206 Acute bronchitis due to rhinovirus: Secondary | ICD-10-CM | POA: Diagnosis not present

## 2022-01-09 LAB — CBC WITH DIFFERENTIAL/PLATELET
Abs Immature Granulocytes: 0.48 10*3/uL — ABNORMAL HIGH (ref 0.00–0.07)
Basophils Absolute: 0 10*3/uL (ref 0.0–0.1)
Basophils Relative: 0 %
Eosinophils Absolute: 0 10*3/uL (ref 0.0–0.5)
Eosinophils Relative: 0 %
HCT: 40.3 % (ref 36.0–46.0)
Hemoglobin: 13 g/dL (ref 12.0–15.0)
Immature Granulocytes: 3 %
Lymphocytes Relative: 9 %
Lymphs Abs: 1.6 10*3/uL (ref 0.7–4.0)
MCH: 31.7 pg (ref 26.0–34.0)
MCHC: 32.3 g/dL (ref 30.0–36.0)
MCV: 98.3 fL (ref 80.0–100.0)
Monocytes Absolute: 0.8 10*3/uL (ref 0.1–1.0)
Monocytes Relative: 5 %
Neutro Abs: 14.3 10*3/uL — ABNORMAL HIGH (ref 1.7–7.7)
Neutrophils Relative %: 83 %
Platelets: 253 10*3/uL (ref 150–400)
RBC: 4.1 MIL/uL (ref 3.87–5.11)
RDW: 12.8 % (ref 11.5–15.5)
WBC: 17.3 10*3/uL — ABNORMAL HIGH (ref 4.0–10.5)
nRBC: 0 % (ref 0.0–0.2)

## 2022-01-09 LAB — GLUCOSE, CAPILLARY
Glucose-Capillary: 156 mg/dL — ABNORMAL HIGH (ref 70–99)
Glucose-Capillary: 123 mg/dL — ABNORMAL HIGH (ref 70–99)
Glucose-Capillary: 194 mg/dL — ABNORMAL HIGH (ref 70–99)
Glucose-Capillary: 140 mg/dL — ABNORMAL HIGH (ref 70–99)
Glucose-Capillary: 182 mg/dL — ABNORMAL HIGH (ref 70–99)
Glucose-Capillary: 139 mg/dL — ABNORMAL HIGH (ref 70–99)

## 2022-01-09 LAB — BASIC METABOLIC PANEL
Anion gap: 6 (ref 5–15)
BUN: 16 mg/dL (ref 8–23)
CO2: 34 mmol/L — ABNORMAL HIGH (ref 22–32)
Calcium: 8.7 mg/dL — ABNORMAL LOW (ref 8.9–10.3)
Chloride: 100 mmol/L (ref 98–111)
Creatinine, Ser: 0.88 mg/dL (ref 0.44–1.00)
GFR, Estimated: >60 mL/min (ref >60)
Glucose, Bld: 158 mg/dL — ABNORMAL HIGH (ref 70–99)
Potassium: 3.9 mmol/L (ref 3.5–5.1)
Sodium: 140 mmol/L (ref 135–145)

## 2022-01-09 MED ORDER — FLUCONAZOLE 100 MG PO TABS
200.0000 mg | ORAL_TABLET | Freq: Once | ORAL | Status: AC
  Administered 2022-01-09: 200 mg via ORAL
  Filled 2022-01-09: qty 2

## 2022-01-09 MED ORDER — LORAZEPAM 1 MG PO TABS: 1.0000 mg | ORAL_TABLET | Freq: Four times a day (QID) | ORAL | Status: DC | PRN

## 2022-01-09 MED ORDER — NYSTATIN 100000 UNIT/ML MT SUSP
5.0000 mL | Freq: Four times a day (QID) | OROMUCOSAL | Status: DC
  Administered 2022-01-09 – 2022-01-14 (×19): 500000 [IU] via ORAL
  Filled 2022-01-09 (×18): qty 5

## 2022-01-09 MED ORDER — HYDROXYZINE HCL 25 MG PO TABS: 25.0000 mg | ORAL_TABLET | Freq: Three times a day (TID) | ORAL | Status: DC | PRN

## 2022-01-09 MED ORDER — BISACODYL 5 MG PO TBEC
5.0000 mg | DELAYED_RELEASE_TABLET | Freq: Every day | ORAL | Status: DC | PRN
  Administered 2022-01-13 – 2022-01-14 (×2): 5 mg via ORAL
  Filled 2022-01-09 (×2): qty 1

## 2022-01-09 MED ORDER — LORAZEPAM 1 MG PO TABS
1.0000 mg | ORAL_TABLET | ORAL | Status: DC | PRN
  Administered 2022-01-09 – 2022-01-11 (×8): 1 mg via ORAL
  Filled 2022-01-09 (×9): qty 1

## 2022-01-09 NOTE — Evaluation (Signed)
Occupational Therapy Evaluation Patient Details Name: Crystal Duarte MRN: [id number] DOB: 06-27-1960 Today's Date: 01/09/2022   History of Present Illness [name] PMHx COPD,Depression, HLD, Presents to the emergency department with shortness of breath.  The patient states that she had COVID in early August of this year. She presents with  sob, productive cough,. She was admitted for acute respiratory failure with hypoxia. Her RVP is positive for rhino virus.   Clinical Impression   PTA patient was living alone in a 1-level private residence with level entry and was grossly independent with ADLs/IADLs without AD. Patient currently functioning below baseline demonstrating observed ADLs including grooming standing at sink level with Min guard and use of RW. Patient also limited by deficits listed below including decreased cardiopulmonary endurance, generalized weakness, and decreased activity tolerance and would benefit from continued acute OT services in prep for safe d/c to next level of care. Recommendation for HHOT vs SNF rehab pending patient progress. OT will continue to follow acutely to maximize safety/independence with ADLs.        Recommendations for follow up therapy are one component of a multi-disciplinary discharge planning process, led by the attending physician.  Recommendations may be updated based on patient status, additional functional criteria and insurance authorization.   Follow Up Recommendations  Other (comment) (SNF vs HHOT pending progress)    Assistance Recommended at Discharge Frequent or constant Supervision/Assistance  Patient can return home with the following A little help with bathing/dressing/bathroom;Assist for transportation;Assistance with cooking/housework    Functional Status Assessment  Patient has had a recent decline in their functional status and demonstrates the ability to make significant improvements in function in a reasonable and  predictable amount of time.  Equipment Recommendations  BSC/3in1    Recommendations for Other Services       Precautions / Restrictions Precautions Precautions: Fall Precaution Comments: droplet, on 2L O2 via [place] Restrictions Weight Bearing Restrictions: No      Mobility Bed Mobility Overal bed mobility: Needs Assistance Bed Mobility: Supine to Sit     Supine to sit: Supervision     General bed mobility comments: Supervision A with HOB slightly elevated. Increased time required for all transitional movements 2/2 SOB.    Transfers Overall transfer level: Needs assistance Equipment used: Rolling walker (2 wheels) Transfers: Sit to/from Stand Sit to Stand: Min guard           General transfer comment: Cues for safe hand placement. Min guard for steadying.      Balance Overall balance assessment: Needs assistance Sitting-balance support: No upper extremity supported Sitting balance-Leahy Scale: Good     Standing balance support: During functional activity, Bilateral upper extremity supported, No upper extremity supported Standing balance-Leahy Scale: Fair Standing balance comment: UE support on RW with mobility. Maintains balance without UE support during ADLs standing at sink level.                           ADL either performed or assessed with clinical judgement   ADL Overall ADL's : Needs assistance/impaired     Grooming: Supervision/safety;Standing Grooming Details (indicate cue type and reason): Oral hygiene standing at sink level with Min guard. Completed hair combing in sitting at sink level for energy conservation. Upper Body Bathing: Set up;Sitting   Lower Body Bathing: Min guard;Sit to/from stand   Upper Body Dressing : Set up;Sitting   Lower Body Dressing: Min guard;Sit to/from stand  Functional mobility during ADLs: Min guard;Rollator (4 wheels)       Vision Baseline Vision/History: 1 Wears glasses Ability  to See in Adequate Light: 0 Adequate Patient Visual Report: No change from baseline Vision Assessment?: No apparent visual deficits     Perception     Praxis      Pertinent Vitals/Pain Pain Assessment Pain Assessment: No/denies pain     Hand Dominance Right   Extremity/Trunk Assessment Upper Extremity Assessment Upper Extremity Assessment: Generalized weakness   Lower Extremity Assessment Lower Extremity Assessment: Defer to PT evaluation   Cervical / Trunk Assessment Cervical / Trunk Assessment: Normal   Communication Communication Communication: No difficulties   Cognition Arousal/Alertness: Awake/alert Behavior During Therapy: WFL for tasks assessed/performed Overall Cognitive Status: Within Functional Limits for tasks assessed                                       General Comments  SpO2 >92 on 2L via [place] at rest and with activity.    Exercises     Shoulder Instructions      Home Living Family/patient expects to be discharged to:: Private residence Living Arrangements: Alone Available Help at Discharge: Family;Available PRN/intermittently Type of Home: Apartment Home Access: Level entry     Home Layout: One level     Bathroom Shower/Tub: Teacher, early years/pre: Handicapped height     Home Equipment: None;Grab bars - tub/shower          Prior Functioning/Environment Prior Level of Function : Independent/Modified Independent                        OT Problem List: Decreased strength;Decreased activity tolerance;Impaired balance (sitting and/or standing);Cardiopulmonary status limiting activity;Decreased knowledge of use of DME or AE      OT Treatment/Interventions: Self-care/ADL training;Therapeutic exercise;Energy conservation;DME and/or AE instruction;Therapeutic activities;Patient/family education;Balance training    OT Goals(Current goals can be found in the care plan section) Acute Rehab OT Goals Patient  Stated Goal: To get better. OT Goal Formulation: With patient Time For Goal Achievement: 01/23/22 Potential to Achieve Goals: Good ADL Goals Pt Will Perform Grooming: with modified independence;standing Pt Will Perform Upper Body Dressing: with modified independence;sitting Pt Will Perform Lower Body Dressing: with modified independence;sit to/from stand Pt Will Transfer to Toilet: with modified independence;ambulating Pt Will Perform Toileting - Clothing Manipulation and hygiene: with modified independence;sit to/from stand Additional ADL Goal #1: Patient will recall and demo 3/3 energy conservation techniques in prep for ADLs.  OT Frequency: Min 2X/week    Co-evaluation              AM-PAC OT "6 Clicks" Daily Activity     Outcome Measure Help from another person eating meals?: None Help from another person taking care of personal grooming?: A Little Help from another person toileting, which includes using toliet, bedpan, or urinal?: A Little Help from another person bathing (including washing, rinsing, drying)?: A Little Help from another person to put on and taking off regular upper body clothing?: A Little Help from another person to put on and taking off regular lower body clothing?: A Little 6 Click Score: 19   End of Session Equipment Utilized During Treatment: Gait belt;Rolling walker (2 wheels) Nurse Communication: Mobility status;Other (comment) (Requesting changing of purewick. Declined transfer to chair in favor of returning to bed for changing despite encouragement to complete  at chair level.)  Activity Tolerance: Patient tolerated treatment well Patient left: in bed;with call bell/phone within reach;with bed alarm set  OT Visit Diagnosis: Unsteadiness on feet (R26.81);Muscle weakness (generalized) (M62.81)                Time: 3953-2023 OT Time Calculation (min): 25 min Charges:  OT General Charges $OT Visit: 1 Visit OT Evaluation $OT Eval Moderate Complexity:  1 Mod OT Treatments $Self Care/Home Management : 8-22 mins  Trinten Boudoin H. OTR/L Supplemental OT, Department of rehab services [phone]  Arnice Vanepps R H. 01/09/2022, 10:51 AM

## 2022-01-09 NOTE — Progress Notes (Signed)
PROGRESS NOTE    Crystal Duarte  TGG:[id number] DOB: Mar 21, 1961 DOA: 01/01/2022 PCP: [name], MD    Chief Complaint  Patient presents with   Shortness of Breath    Brief Narrative:   [name] PMHx COPD,Depression, HLD, Presents to the emergency department with shortness of breath.  The patient states that she had COVID in early August of this year. She presents with  sob, productive cough,. She was admitted for acute respiratory failure with hypoxia. Her RVP is positive for rhino virus. She was started on IV solumedrol, completed Remdesevir. PCCM consulted as she continues to worsen with worsening cough, sob,and diffuse wheezing despite being on steroids and neb treatments. CTA of the chest is negative for PE study. She was started on BIPAP and she reports marked improvement in the breathing.  She still continues to have diffuse wheezing, but better than the last 2 days.  She was saturating 98% at rest on 2lit of [place] oxygen.    Assessment & Plan:   Principal Problem:   Pneumonia due to COVID-19 virus Active Problems:   Affective bipolar disorder ([place])   Severe episode of recurrent major depressive disorder, without psychotic features (HCC)   PAD (peripheral artery disease) (HCC)   Generalized anxiety disorder   Chest pain in adult   Shortness of breath   Obesity (BMI 30-39.9)   Acute bronchitis due to Rhinovirus   Acute diastolic CHF (congestive heart failure) (HCC)   Acute respiratory failure with hypoxia (HCC)   COPD exacerbation (HCC)   COVID-19   Acute respiratory failure with hypoxia probably secondary to COVID-pneumonia/rhinovirus acute bronchitis/ acute copd exacerbation.  Completed the course of remdesivir. PCCM consulted for worsening breathing and diffuse wheezing.   She was started on IV solumedrol 60 mg daily, improved breathing. Transition to oral steroids, will plan for slow taper .  Continue with incentive spirometry and flutter  valves Paxlovid ordered and prn bipap for symptomatic relief.  CTA of the chest is negative for PE, effusion and consolidation.  She continues to improve, but slowly.  Recommend our of bed and ambulation. In view of her persistent wheezing will hold the propranolol and monitor her .     Generalized anxiety disorder Continue with BuSpar ,  and Ativan 1 mg twice daily, .will increase the frequency of ativan and add atarax, and discontinue the propranolol as she has diffuse wheezing .     Chest pain Appears to have resolved . echocardiogram reviewed     Essential hypertension Blood pressure parameters slightly elevated , added hydralazine 25 mg TID. Will add norvasc 5 mg daily.     Chronic diastolic heart failure Continue to monitor. CTA neg for effusion.  Does not appear to be decompensated.    Body mass index is 33.26 kg/m. Obesity:    Hypokalemia:  Replaced. Repeat levels wnl.     Nasal congestion;  Added Afrin spray.   Leukocytosis:  Possibly from steroids.    DVT prophylaxis: (Lovenox Code Status: Partial code Family Communication: None at bedside, discussed with daughter over the phone . Disposition:   Status is: Inpatient Remains inpatient appropriate because: pending improvement.     Level of care: Stepdown Consultants:  PCCM.  Procedures: None  Antimicrobials:  Antibiotics Given (last 72 hours)     Date/Time Action Medication Dose   01/06/22 2144 Given   nirmatrelvir/ritonavir EUA (PAXLOVID) 3 tablet 3 tablet   01/07/22 0953 Given   nirmatrelvir/ritonavir EUA (PAXLOVID) 3 tablet 3 tablet  01/07/22 2124 Given   nirmatrelvir/ritonavir EUA (PAXLOVID) 3 tablet 3 tablet   01/08/22 1008 Given   nirmatrelvir/ritonavir EUA (PAXLOVID) 3 tablet 3 tablet   01/08/22 2121 Given   nirmatrelvir/ritonavir EUA (PAXLOVID) 3 tablet 3 tablet   01/09/22 0945 Given   nirmatrelvir/ritonavir EUA (PAXLOVID) 3 tablet 3 tablet         Subjective: Coughing not able to bring anything up.  Objective: Vitals:   01/09/22 0831 01/09/22 0852 01/09/22 0853 01/09/22 0940  BP:    (!) 152/69  Pulse:    92  Resp:    20  Temp: 97.6 F (36.4 C)     TempSrc: Oral     SpO2:  100% 100% 97%  Weight:      Height:        Intake/Output Summary (Last 24 hours) at 01/09/2022 1001 Last data filed at 01/09/2022 0500 Gross per 24 hour  Intake 200 ml  Output 1950 ml  Net -1750 ml    Filed Weights   01/05/22 1345 01/07/22 0600 01/09/22 0356  Weight: 86.9 kg 87.3 kg 87.9 kg    Examination:  General exam: ill appearing lady, not in distress.  Respiratory system: bilateral wheezing exp , air entry fair,  Cardiovascular system: S1 & S2 heard, RRR. No JVD,  No pedal edema. Gastrointestinal system: Abdomen is nondistended, soft and nontender. Normal bowel sounds heard. Central nervous system: Alert and oriented. No focal neurological deficits. Extremities: Symmetric 5 x 5 power. Skin: No rashes, lesions or ulcers Psychiatry:  Mood & affect appropriate.       Data Reviewed: I have personally reviewed following labs and imaging studies  CBC: Recent Labs  Lab 01/04/22 0639 01/05/22 1033 01/06/22 0307 01/07/22 0311 01/09/22 0304  WBC 13.6* 14.0* 14.7* 18.3* 17.3*  NEUTROABS 9.6* 8.7* 11.1* 15.1* 14.3*  HGB 11.9* 12.9 13.1 13.7 13.0  HCT 37.3 40.0 39.9 42.1 40.3  MCV 100.0 99.3 97.6 97.7 98.3  PLT 204 226 226 264 253     Basic Metabolic Panel: Recent Labs  Lab 01/03/22 0318 01/04/22 0639 01/05/22 1033 01/06/22 0307 01/07/22 0311 01/08/22 0304 01/09/22 0304  NA 141 141 139 140 141 138 140  K 4.0 3.8 3.5 3.8 3.5 3.3* 3.9  CL 106 105 98 102 102 98 100  CO2 30 30 33* 31 31 32 34*  GLUCOSE 134* 99 119* 117* 109* 160* 158*  BUN '18 17 16 14 18 16 16  '$ CREATININE 0.61 0.61 0.69 0.67 0.85 0.79 0.88  CALCIUM 8.8* 8.5* 8.4* 8.5* 9.0 8.6* 8.7*  MG 2.3 2.0 2.2 2.7* 2.3 2.2  --   PHOS 3.8 3.9 4.0 4.5 4.5  --    --      GFR: Estimated Creatinine Clearance: 71.2 mL/min (by C-G formula based on SCr of 0.88 mg/dL).  Liver Function Tests: Recent Labs  Lab 01/04/22 0639 01/05/22 1033 01/06/22 0307 01/07/22 0311 01/08/22 0304  AST '17 20 17 15 15  '$ ALT '18 19 19 20 17  '$ ALKPHOS 50 50 54 66 55  BILITOT 0.4 0.4 0.5 0.6 0.5  PROT 5.9* 6.0* 5.8* 6.2* 5.7*  ALBUMIN 3.3* 3.3* 3.2* 3.4* 3.1*     CBG: Recent Labs  Lab 01/08/22 1705 01/08/22 1946 01/08/22 2316 01/09/22 0308 01/09/22 0752  GLUCAP 195* 245* 115* 156* 123*      Recent Results (from the past 240 hour(s))  Resp Panel by RT-PCR (Flu A&B, Covid) Anterior Nasal Swab     Status: Abnormal   Collection  Time: 01/01/22 10:51 AM   Specimen: Anterior Nasal Swab  Result Value Ref Range Status   SARS Coronavirus 2 by RT PCR POSITIVE (A) NEGATIVE Final    Comment: (NOTE) SARS-CoV-2 target nucleic acids are DETECTED.  The SARS-CoV-2 RNA is generally detectable in upper respiratory specimens during the acute phase of infection. Positive results are indicative of the presence of the identified virus, but do not rule out bacterial infection or co-infection with other pathogens not detected by the test. Clinical correlation with patient history and other diagnostic information is necessary to determine patient infection status. The expected result is Negative.  Fact Sheet for Patients: EntrepreneurPulse.com.au  Fact Sheet for Healthcare Providers: IncredibleEmployment.be  This test is not yet approved or cleared by the [place] FDA and  has been authorized for detection and/or diagnosis of SARS-CoV-2 by FDA under an Emergency Use Authorization (EUA).  This EUA will remain in effect (meaning this test can be used) for the duration of  the COVID-19 declaration under Section 564(b)(1) of the A ct, 21 U.S.C. section 360bbb-3(b)(1), unless the authorization is terminated or revoked sooner.      Influenza A by PCR NEGATIVE NEGATIVE Final   Influenza B by PCR NEGATIVE NEGATIVE Final    Comment: (NOTE) The Xpert Xpress SARS-CoV-2/FLU/RSV plus assay is intended as an aid in the diagnosis of influenza from Nasopharyngeal swab specimens and should not be used as a sole basis for treatment. Nasal washings and aspirates are unacceptable for Xpert Xpress SARS-CoV-2/FLU/RSV testing.  Fact Sheet for Patients: EntrepreneurPulse.com.au  Fact Sheet for Healthcare Providers: IncredibleEmployment.be  This test is not yet approved or cleared by the [place] FDA and has been authorized for detection and/or diagnosis of SARS-CoV-2 by FDA under an Emergency Use Authorization (EUA). This EUA will remain in effect (meaning this test can be used) for the duration of the COVID-19 declaration under Section 564(b)(1) of the Act, 21 U.S.C. section 360bbb-3(b)(1), unless the authorization is terminated or revoked.  Performed at [facility], [address], [place], [place] [address]   Culture, blood (Routine X 2) w Reflex to ID Panel     Status: None   Collection Time: 01/01/22  4:51 PM   Specimen: BLOOD  Result Value Ref Range Status   Specimen Description   Final    BLOOD LEFT ANTECUBITAL Performed at [facility], [place] [address], [place], [place] [address]    Special Requests   Final    BOTTLES DRAWN AEROBIC ONLY Blood Culture adequate volume Performed at [facility] [address] [place], [place] [address]    Culture   Final    NO GROWTH 5 DAYS Performed at [facility], [place] [address], [place], [place] [address]    Report Status 01/06/2022 FINAL  Final  Culture, blood (Routine X 2) w Reflex to ID Panel     Status: None   Collection Time: 01/01/22  4:51 PM   Specimen: BLOOD LEFT FOREARM  Result Value Ref Range Status   Specimen Description   Final    BLOOD LEFT FOREARM Performed at  [facility] [address] [place], [place] [address]    Special Requests   Final    BOTTLES DRAWN AEROBIC ONLY Blood Culture adequate volume Performed at [facility] [address] [place], [place] [address]    Culture   Final    NO GROWTH 5 DAYS Performed at [facility], [address] [place], [place] [address]    Report Status 01/06/2022 FINAL  Final  Respiratory (~20 pathogens) panel by PCR     Status: Abnormal   Collection Time: 01/01/22  5:31 PM   Specimen: Nasopharyngeal Swab; Respiratory  Result Value Ref Range Status   Adenovirus NOT DETECTED NOT DETECTED Final   Coronavirus 229E NOT DETECTED NOT DETECTED Final    Comment: (NOTE) The Coronavirus on the Respiratory Panel, DOES NOT test for the novel  Coronavirus (2019 nCoV)    Coronavirus HKU1 NOT DETECTED NOT DETECTED Final   Coronavirus NL63 NOT DETECTED NOT DETECTED Final   Coronavirus OC43 NOT DETECTED NOT DETECTED Final   Metapneumovirus NOT DETECTED NOT DETECTED Final   Rhinovirus / Enterovirus DETECTED (A) NOT DETECTED Final   Influenza A NOT DETECTED NOT DETECTED Final   Influenza B NOT DETECTED NOT DETECTED Final   Parainfluenza Virus 1 NOT DETECTED NOT DETECTED Final   Parainfluenza Virus 2 NOT DETECTED NOT DETECTED Final   Parainfluenza Virus 3 NOT DETECTED NOT DETECTED Final   Parainfluenza Virus 4 NOT DETECTED NOT DETECTED Final   Respiratory Syncytial Virus NOT DETECTED NOT DETECTED Final   Bordetella pertussis NOT DETECTED NOT DETECTED Final   Bordetella Parapertussis NOT DETECTED NOT DETECTED Final   Chlamydophila pneumoniae NOT DETECTED NOT DETECTED Final   Mycoplasma pneumoniae NOT DETECTED NOT DETECTED Final    Comment: Performed at [facility], [place]. [address], [place], [place] [address]  MRSA Next Gen by PCR, Nasal     Status: None   Collection Time: 01/01/22  7:02 PM   Specimen: Nasal Mucosa; Nasal Swab  Result Value Ref Range Status    MRSA by PCR Next Gen NOT DETECTED NOT DETECTED Final    Comment: (NOTE) The GeneXpert MRSA Assay (FDA approved for NASAL specimens only), is one component of a comprehensive MRSA colonization surveillance program. It is not intended to diagnose MRSA infection nor to guide or monitor treatment for MRSA infections. Test performance is not FDA approved in patients less than 81 years old. Performed at [facility], [place] [address] [place], [place] [address]   Culture, blood (Routine X 2) w Reflex to ID Panel     Status: None   Collection Time: 01/01/22  7:37 PM   Specimen: BLOOD LEFT HAND  Result Value Ref Range Status   Specimen Description   Final    BLOOD LEFT HAND Performed at [facility] [address], [place], [place] [address]    Special Requests   Final    BOTTLES DRAWN AEROBIC AND ANAEROBIC Blood Culture adequate volume Performed at [facility] [address] [place], [place] [address]    Culture   Final    NO GROWTH 5 DAYS Performed at [facility], [place] [address] [place], [place] [address]    Report Status 01/06/2022 FINAL  Final  Culture, blood (Routine X 2) w Reflex to ID Panel     Status: None   Collection Time: 01/01/22  7:44 PM   Specimen: BLOOD RIGHT HAND  Result Value Ref Range Status   Specimen Description   Final    BLOOD RIGHT HAND Performed at [facility] [address], [place], [place] [address]    Special Requests   Final    BOTTLES DRAWN AEROBIC AND ANAEROBIC Blood Culture adequate volume Performed at [facility] [address] [place],  [address]  Culture   Final    NO GROWTH 5 DAYS Performed at [facility], [place] [address], [place], [place] [address]    Report Status 01/06/2022 FINAL  Final         Radiology Studies: No results found.      Scheduled Meds:  arformoterol  15 mcg Nebulization BID   ascorbic acid   500 mg Oral Daily   budesonide (PULMICORT) nebulizer solution  0.25 mg Nebulization BID   busPIRone  2.5 mg Oral Daily   [START ON 01/14/2022] busPIRone  20 mg Oral TID   Chlorhexidine Gluconate Cloth  6 each Topical Daily   dextromethorphan-guaiFENesin  1 tablet Oral BID   enoxaparin (LOVENOX) injection  40 mg Subcutaneous Q24H   fluticasone  2 spray Each Nare Daily   gabapentin  400 mg Oral BID   gabapentin  600 mg Oral QHS   hydrALAZINE  25 mg Oral Q8H   influenza vac split quadrivalent PF  0.5 mL Intramuscular Tomorrow-1000   insulin aspart  0-15 Units Subcutaneous Q4H   loratadine  10 mg Oral Daily   montelukast  10 mg Oral QHS   nirmatrelvir/ritonavir EUA  3 tablet Oral BID   mouth rinse  15 mL Mouth Rinse 4 times per day   oxymetazoline  1 spray Each Nare BID   pantoprazole  40 mg Oral Daily   predniSONE  40 mg Oral QAC breakfast   propranolol  10 mg Oral TID   revefenacin  175 mcg Nebulization Daily   traZODone  250 mg Oral QHS   venlafaxine XR  150 mg Oral Q breakfast   venlafaxine XR  75 mg Oral Q breakfast   zinc sulfate  220 mg Oral Daily   Continuous Infusions:   LOS: 8 days        Hosie Poisson, MD [facility]   To contact the attending provider between 7A-7P or the covering provider during after hours 7P-7A, please log into the web site [email or web address] and access using universal [place] password for that web site. If you do not have the password, please call the hospital operator.  01/09/2022, 10:01 AM

## 2022-01-10 ENCOUNTER — Ambulatory Visit: Payer: Medicare HMO | Admitting: Internal Medicine

## 2022-01-10 DIAGNOSIS — U071 COVID-19: Secondary | ICD-10-CM | POA: Diagnosis not present

## 2022-01-10 DIAGNOSIS — F310 Bipolar disorder, current episode hypomanic: Secondary | ICD-10-CM | POA: Diagnosis not present

## 2022-01-10 DIAGNOSIS — I5031 Acute diastolic (congestive) heart failure: Secondary | ICD-10-CM | POA: Diagnosis not present

## 2022-01-10 DIAGNOSIS — J206 Acute bronchitis due to rhinovirus: Secondary | ICD-10-CM | POA: Diagnosis not present

## 2022-01-10 LAB — GLUCOSE, CAPILLARY
Glucose-Capillary: 132 mg/dL — ABNORMAL HIGH (ref 70–99)
Glucose-Capillary: 97 mg/dL (ref 70–99)
Glucose-Capillary: 156 mg/dL — ABNORMAL HIGH (ref 70–99)
Glucose-Capillary: 159 mg/dL — ABNORMAL HIGH (ref 70–99)
Glucose-Capillary: 149 mg/dL — ABNORMAL HIGH (ref 70–99)
Glucose-Capillary: 126 mg/dL — ABNORMAL HIGH (ref 70–99)

## 2022-01-10 MED ORDER — AMLODIPINE BESYLATE 5 MG PO TABS
5.0000 mg | ORAL_TABLET | Freq: Every day | ORAL | Status: DC
  Administered 2022-01-10 – 2022-01-15 (×6): 5 mg via ORAL
  Filled 2022-01-10 (×6): qty 1

## 2022-01-10 NOTE — Progress Notes (Signed)
Pt awake and wanting to remove BIPAP.  Pt placed on 2 LPM [place] and tolerating well.

## 2022-01-10 NOTE — Progress Notes (Signed)
PROGRESS NOTE    Crystal Duarte  NIO:[id number] DOB: 07/22/1960 DOA: 01/01/2022 PCP: [name], MD    Chief Complaint  Patient presents with   Shortness of Breath    Brief Narrative:   [name] PMHx COPD,Depression, HLD, Presents to the emergency department with shortness of breath.  The patient states that she had COVID in early August of this year. She presents with  sob, productive cough,. She was admitted for acute respiratory failure with hypoxia. Her RVP is positive for rhino virus. She was started on IV solumedrol, completed Remdesevir. PCCM consulted as she continues to worsen with worsening cough, sob,and diffuse wheezing despite being on steroids and neb treatments. CTA of the chest is negative for PE study. She was started on BIPAP and she reports marked improvement in the breathing.  She still continues to have diffuse wheezing, but better than the last 2 days.  She was saturating 98% at rest on 2lit of [place] oxygen. Patient seen and examined at bedside.    Assessment & Plan:   Principal Problem:   Pneumonia due to COVID-19 virus Active Problems:   Affective bipolar disorder ([place])   Severe episode of recurrent major depressive disorder, without psychotic features (HCC)   PAD (peripheral artery disease) (HCC)   Generalized anxiety disorder   Chest pain in adult   Shortness of breath   Obesity (BMI 30-39.9)   Acute bronchitis due to Rhinovirus   Acute diastolic CHF (congestive heart failure) (HCC)   Acute respiratory failure with hypoxia (HCC)   COPD exacerbation (HCC)   COVID-19   Acute respiratory failure with hypoxia probably secondary to COVID-pneumonia/rhinovirus acute bronchitis/ acute copd exacerbation.  Completed the course of remdesivir and paxlovid.  PCCM consulted for worsening breathing and diffuse wheezing.  She was started on IV solumedrol 60 mg daily, improved breathing. Transition to oral steroids, will plan for slow taper .   Continue with incentive spirometry and flutter valves Patient using BIPAP at night.  CTA of the chest is negative for PE, effusion and consolidation.  She continues to improve, but slowly.  Recommend our of bed and ambulation.  In view of her persistent wheezing will hold the propranolol and monitor her .     Generalized anxiety disorder Continue with BuSpar ,  and Ativan 1 mg twice daily, .will increase the frequency of ativan and add atarax, and discontinue the propranolol as she has diffuse wheezing .  Well controlled.     Chest pain Appears to have resolved . Echocardiogram showed Left ventricular ejection fraction, by estimation, is 70 to 75%. The left ventricle has hyperdynamic function. The left ventricle has no regional wall motion abnormalities. Left ventricular diastolic parameters are consistent with Grade I diastolic dysfunction (impaired relaxation).    Essential hypertension Blood pressure parameters slightly elevated , added hydralazine 25 mg TID.  Added norvasc 5 mg daily.     Chronic diastolic heart failure Continue to monitor. CTA neg for effusion.  Does not appear to be decompensated.    Body mass index is 33.26 kg/m. Obesity:    Hypokalemia:  Replaced. Repeat levels wnl.     Nasal congestion;  Added Afrin spray.   Leukocytosis:  Possibly from steroids.    DVT prophylaxis: (Lovenox Code Status: Partial code Family Communication: None at bedside.  Disposition:   Status is: Inpatient Remains inpatient appropriate because: pending improvement.     Level of care: Stepdown Consultants:  PCCM.  Procedures: None  Antimicrobials:  Antibiotics Given (  last 72 hours)     Date/Time Action Medication Dose   01/07/22 2124 Given   nirmatrelvir/ritonavir EUA (PAXLOVID) 3 tablet 3 tablet   01/08/22 1008 Given   nirmatrelvir/ritonavir EUA (PAXLOVID) 3 tablet 3 tablet   01/08/22 2121 Given   nirmatrelvir/ritonavir EUA (PAXLOVID) 3 tablet 3  tablet   01/09/22 0945 Given   nirmatrelvir/ritonavir EUA (PAXLOVID) 3 tablet 3 tablet   01/09/22 2125 Given   nirmatrelvir/ritonavir EUA (PAXLOVID) 3 tablet 3 tablet        Subjective: Wheezing has much improved.   Objective: Vitals:   01/10/22 0500 01/10/22 0600 01/10/22 0730 01/10/22 0824  BP:  (!) 177/87    Pulse: 72 77  91  Resp: 17 (!) 21  18  Temp:   97.9 F (36.6 C)   TempSrc:   Oral   SpO2: 95% 94%  94%  Weight:      Height:        Intake/Output Summary (Last 24 hours) at 01/10/2022 1139 Last data filed at 01/10/2022 0600 Gross per 24 hour  Intake 1200 ml  Output 3250 ml  Net -2050 ml    Filed Weights   01/05/22 1345 01/07/22 0600 01/09/22 0356  Weight: 86.9 kg 87.3 kg 87.9 kg    Examination:  General exam: Ill appearing lady, not in distress on 2 lit of [place] oxygen.  Respiratory system: scattered wheezing bilateral. Air entry fair. Cardiovascular system: S1 & S2 heard, RRR. No JVD,  No pedal edema. Gastrointestinal system: Abdomen is nondistended, soft and nontender. Normal bowel sounds heard. Central nervous system: Alert and oriented. No focal neurological deficits. Extremities: Symmetric 5 x 5 power. Skin: No rashes, Psychiatry:  Mood & affect appropriate.        Data Reviewed: I have personally reviewed following labs and imaging studies  CBC: Recent Labs  Lab 01/04/22 0639 01/05/22 1033 01/06/22 0307 01/07/22 0311 01/09/22 0304  WBC 13.6* 14.0* 14.7* 18.3* 17.3*  NEUTROABS 9.6* 8.7* 11.1* 15.1* 14.3*  HGB 11.9* 12.9 13.1 13.7 13.0  HCT 37.3 40.0 39.9 42.1 40.3  MCV 100.0 99.3 97.6 97.7 98.3  PLT 204 226 226 264 253     Basic Metabolic Panel: Recent Labs  Lab 01/04/22 0639 01/05/22 1033 01/06/22 0307 01/07/22 0311 01/08/22 0304 01/09/22 0304  NA 141 139 140 141 138 140  K 3.8 3.5 3.8 3.5 3.3* 3.9  CL 105 98 102 102 98 100  CO2 30 33* 31 31 32 34*  GLUCOSE 99 119* 117* 109* 160* 158*  BUN '17 16 14 18 16 16  '$ CREATININE  0.61 0.69 0.67 0.85 0.79 0.88  CALCIUM 8.5* 8.4* 8.5* 9.0 8.6* 8.7*  MG 2.0 2.2 2.7* 2.3 2.2  --   PHOS 3.9 4.0 4.5 4.5  --   --      GFR: Estimated Creatinine Clearance: 71.2 mL/min (by C-G formula based on SCr of 0.88 mg/dL).  Liver Function Tests: Recent Labs  Lab 01/04/22 0639 01/05/22 1033 01/06/22 0307 01/07/22 0311 01/08/22 0304  AST '17 20 17 15 15  '$ ALT '18 19 19 20 17  '$ ALKPHOS 50 50 54 66 55  BILITOT 0.4 0.4 0.5 0.6 0.5  PROT 5.9* 6.0* 5.8* 6.2* 5.7*  ALBUMIN 3.3* 3.3* 3.2* 3.4* 3.1*     CBG: Recent Labs  Lab 01/09/22 2011 01/09/22 2310 01/10/22 0340 01/10/22 0731 01/10/22 1117  GLUCAP 182* 139* 132* 97 156*      Recent Results (from the past 240 hour(s))  Resp Panel by  RT-PCR (Flu A&B, Covid) Anterior Nasal Swab     Status: Abnormal   Collection Time: 01/01/22 10:51 AM   Specimen: Anterior Nasal Swab  Result Value Ref Range Status   SARS Coronavirus 2 by RT PCR POSITIVE (A) NEGATIVE Final    Comment: (NOTE) SARS-CoV-2 target nucleic acids are DETECTED.  The SARS-CoV-2 RNA is generally detectable in upper respiratory specimens during the acute phase of infection. Positive results are indicative of the presence of the identified virus, but do not rule out bacterial infection or co-infection with other pathogens not detected by the test. Clinical correlation with patient history and other diagnostic information is necessary to determine patient infection status. The expected result is Negative.  Fact Sheet for Patients: EntrepreneurPulse.com.au  Fact Sheet for Healthcare Providers: IncredibleEmployment.be  This test is not yet approved or cleared by the [place] FDA and  has been authorized for detection and/or diagnosis of SARS-CoV-2 by FDA under an Emergency Use Authorization (EUA).  This EUA will remain in effect (meaning this test can be used) for the duration of  the COVID-19 declaration under Section  564(b)(1) of the A ct, 21 U.S.C. section 360bbb-3(b)(1), unless the authorization is terminated or revoked sooner.     Influenza A by PCR NEGATIVE NEGATIVE Final   Influenza B by PCR NEGATIVE NEGATIVE Final    Comment: (NOTE) The Xpert Xpress SARS-CoV-2/FLU/RSV plus assay is intended as an aid in the diagnosis of influenza from Nasopharyngeal swab specimens and should not be used as a sole basis for treatment. Nasal washings and aspirates are unacceptable for Xpert Xpress SARS-CoV-2/FLU/RSV testing.  Fact Sheet for Patients: EntrepreneurPulse.com.au  Fact Sheet for Healthcare Providers: IncredibleEmployment.be  This test is not yet approved or cleared by the [place] FDA and has been authorized for detection and/or diagnosis of SARS-CoV-2 by FDA under an Emergency Use Authorization (EUA). This EUA will remain in effect (meaning this test can be used) for the duration of the COVID-19 declaration under Section 564(b)(1) of the Act, 21 U.S.C. section 360bbb-3(b)(1), unless the authorization is terminated or revoked.  Performed at [facility], [address] [place], [place] [address]   Culture, blood (Routine X 2) w Reflex to ID Panel     Status: None   Collection Time: 01/01/22  4:51 PM   Specimen: BLOOD  Result Value Ref Range Status   Specimen Description   Final    BLOOD LEFT ANTECUBITAL Performed at [facility], [place] [address], [place], [place] [address]    Special Requests   Final    BOTTLES DRAWN AEROBIC ONLY Blood Culture adequate volume Performed at [facility] [address] [place], [place] [address]    Culture   Final    NO GROWTH 5 DAYS Performed at [facility], [place] [address] [place], [place] [address]    Report Status 01/06/2022 FINAL  Final  Culture, blood (Routine X 2) w Reflex to ID Panel     Status: None   Collection Time: 01/01/22  4:51 PM   Specimen:  BLOOD LEFT FOREARM  Result Value Ref Range Status   Specimen Description   Final    BLOOD LEFT FOREARM Performed at [facility] [address] [place], [place] [address]    Special Requests   Final    BOTTLES DRAWN AEROBIC ONLY Blood Culture adequate volume Performed at [facility] [address], [place], [place] [address]    Culture  Final    NO GROWTH 5 DAYS Performed at [facility], [place] [address], [place], [place] [address]    Report Status 01/06/2022 FINAL  Final  Respiratory (~20 pathogens) panel by PCR     Status: Abnormal   Collection Time: 01/01/22  5:31 PM   Specimen: Nasopharyngeal Swab; Respiratory  Result Value Ref Range Status   Adenovirus NOT DETECTED NOT DETECTED Final   Coronavirus 229E NOT DETECTED NOT DETECTED Final    Comment: (NOTE) The Coronavirus on the Respiratory Panel, DOES NOT test for the novel  Coronavirus (2019 nCoV)    Coronavirus HKU1 NOT DETECTED NOT DETECTED Final   Coronavirus NL63 NOT DETECTED NOT DETECTED Final   Coronavirus OC43 NOT DETECTED NOT DETECTED Final   Metapneumovirus NOT DETECTED NOT DETECTED Final   Rhinovirus / Enterovirus DETECTED (A) NOT DETECTED Final   Influenza A NOT DETECTED NOT DETECTED Final   Influenza B NOT DETECTED NOT DETECTED Final   Parainfluenza Virus 1 NOT DETECTED NOT DETECTED Final   Parainfluenza Virus 2 NOT DETECTED NOT DETECTED Final   Parainfluenza Virus 3 NOT DETECTED NOT DETECTED Final   Parainfluenza Virus 4 NOT DETECTED NOT DETECTED Final   Respiratory Syncytial Virus NOT DETECTED NOT DETECTED Final   Bordetella pertussis NOT DETECTED NOT DETECTED Final   Bordetella Parapertussis NOT DETECTED NOT DETECTED Final   Chlamydophila pneumoniae NOT DETECTED NOT DETECTED Final   Mycoplasma pneumoniae NOT DETECTED NOT DETECTED Final    Comment: Performed at [facility], [place] [address], [place], [place] [address]  MRSA Next Gen by PCR, Nasal      Status: None   Collection Time: 01/01/22  7:02 PM   Specimen: Nasal Mucosa; Nasal Swab  Result Value Ref Range Status   MRSA by PCR Next Gen NOT DETECTED NOT DETECTED Final    Comment: (NOTE) The GeneXpert MRSA Assay (FDA approved for NASAL specimens only), is one component of a comprehensive MRSA colonization surveillance program. It is not intended to diagnose MRSA infection nor to guide or monitor treatment for MRSA infections. Test performance is not FDA approved in patients less than 34 years old. Performed at [facility], [place] [address], [place], [place] [address]   Culture, blood (Routine X 2) w Reflex to ID Panel     Status: None   Collection Time: 01/01/22  7:37 PM   Specimen: BLOOD LEFT HAND  Result Value Ref Range Status   Specimen Description   Final    BLOOD LEFT HAND Performed at [facility] [address] [place], [place] [address]    Special Requests   Final    BOTTLES DRAWN AEROBIC AND ANAEROBIC Blood Culture adequate volume Performed at [facility] [address], [place], [place] [address]    Culture   Final    NO GROWTH 5 DAYS Performed at [facility], [place] [address] [place], [place] [address]    Report Status 01/06/2022 FINAL  Final  Culture, blood (Routine X 2) w Reflex to ID Panel     Status: None   Collection Time: 01/01/22  7:44 PM   Specimen: BLOOD RIGHT HAND  Result Value Ref Range Status   Specimen Description   Final    BLOOD RIGHT HAND Performed at [facility] [address], [place], [place] [address]    Special Requests   Final    BOTTLES DRAWN AEROBIC AND ANAEROBIC Blood Culture adequate volume Performed  at [facility], [place] [address], [place], [place] [address]    Culture   Final    NO GROWTH 5 DAYS Performed at [facility], [place] [address] [place], [place] [address]    Report Status 01/06/2022 FINAL  Final          Radiology Studies: No results found.      Scheduled Meds:  arformoterol  15 mcg Nebulization BID   ascorbic acid  500 mg Oral Daily   budesonide (PULMICORT) nebulizer solution  0.25 mg Nebulization BID   busPIRone  2.5 mg Oral Daily   [START ON 01/14/2022] busPIRone  20 mg Oral TID   Chlorhexidine Gluconate Cloth  6 each Topical Daily   dextromethorphan-guaiFENesin  1 tablet Oral BID   enoxaparin (LOVENOX) injection  40 mg Subcutaneous Q24H   fluticasone  2 spray Each Nare Daily   gabapentin  400 mg Oral BID   gabapentin  600 mg Oral QHS   hydrALAZINE  25 mg Oral Q8H   influenza vac split quadrivalent PF  0.5 mL Intramuscular Tomorrow-1000   insulin aspart  0-15 Units Subcutaneous Q4H   loratadine  10 mg Oral Daily   montelukast  10 mg Oral QHS   nirmatrelvir/ritonavir EUA  3 tablet Oral BID   nystatin  5 mL Oral QID   mouth rinse  15 mL Mouth Rinse 4 times per day   oxymetazoline  1 spray Each Nare BID   pantoprazole  40 mg Oral Daily   predniSONE  40 mg Oral QAC breakfast   revefenacin  175 mcg Nebulization Daily   traZODone  250 mg Oral QHS   venlafaxine XR  150 mg Oral Q breakfast   venlafaxine XR  75 mg Oral Q breakfast   zinc sulfate  220 mg Oral Daily   Continuous Infusions:   LOS: 9 days        Hosie Poisson, MD [facility]   To contact the attending provider between 7A-7P or the covering provider during after hours 7P-7A, please log into the web site [email or web address] and access using universal [place] password for that web site. If you do not have the password, please call the hospital operator.  01/10/2022, 11:39 AM

## 2022-01-10 NOTE — Progress Notes (Addendum)
Physical Therapy Treatment Patient Details Name: Crystal Duarte MRN: [id number] DOB: May 16, 1960 Today's Date: 01/10/2022   History of Present Illness [name] PMHx COPD,Depression, HLD, Presents to the emergency department with shortness of breath.  The patient states that she had COVID in early August of this year. She presents with  sob, productive cough,. She was admitted for acute respiratory failure with hypoxia. Her RVP is positive for rhino virus.    PT Comments    THe patient is progressing well, Ambulated x 400' on 2 L using a RW. Patient did stop for rest braks x 3.  Patient asking about next step when she moves out of ICU. Patient is interested in Short term SNF due to  living alone and family members are not available.  Continue progressive mobility and monitor need of supplemental O2. SPO2 on 2 L > 94% and HR max 122 when ambulating.  Recommendations for follow up therapy are one component of a multi-disciplinary discharge planning process, led by the attending physician.  Recommendations may be updated based on patient status, additional functional criteria and insurance authorization.  Follow Up Recommendations  Skilled nursing-short term rehab (<3 hours/day) Can patient physically be transported by private vehicle: Yes (when out of ICU)   Assistance Recommended at Discharge Set up Supervision/Assistance  Patient can return home with the following A little help with bathing/dressing/bathroom;Help with stairs or ramp for entrance;Assistance with cooking/housework;Assist for transportation   Equipment Recommendations  Rolling walker (2 wheels)    Recommendations for Other Services       Precautions / Restrictions Precautions Precautions: Fall Precaution Comments: droplet, on 2L O2 via [place]     Mobility  Bed Mobility Overal bed mobility: Modified Independent       Supine to sit: HOB elevated          Transfers   Equipment used: Rolling walker (2  wheels) Transfers: Sit to/from Stand Sit to Stand: Supervision           General transfer comment: steady to rise    Ambulation/Gait Ambulation/Gait assistance: Supervision Gait Distance (Feet): 400 Feet Assistive device: Rolling walker (2 wheels) Gait Pattern/deviations: Step-through pattern Gait velocity: decr     General Gait Details: gait steady with RW   Stairs             Wheelchair Mobility    Modified Rankin (Stroke Patients Only)       Balance Overall balance assessment: No apparent balance deficits (not formally assessed)           Standing balance-Leahy Scale: Good                              Cognition Arousal/Alertness: Awake/alert                                              Exercises      General Comments        Pertinent Vitals/Pain Pain Assessment Pain Assessment: No/denies pain    Home Living                          Prior Function            PT Goals (current goals can now be found in the care plan section) Progress towards PT  goals: Progressing toward goals    Frequency    Min 2X/week      PT Plan Current plan remains appropriate    Co-evaluation              AM-PAC PT "6 Clicks" Mobility   Outcome Measure  Help needed turning from your back to your side while in a flat bed without using bedrails?: None Help needed moving from lying on your back to sitting on the side of a flat bed without using bedrails?: None Help needed moving to and from a bed to a chair (including a wheelchair)?: None Help needed standing up from a chair using your arms (e.g., wheelchair or bedside chair)?: A Little Help needed to walk in hospital room?: A Little Help needed climbing 3-5 steps with a railing? : A Lot 6 Click Score: 20    End of Session Equipment Utilized During Treatment: Oxygen Activity Tolerance: Patient tolerated treatment well Patient left: in bed;with call  bell/phone within reach Nurse Communication: Mobility status PT Visit Diagnosis: Difficulty in walking, not elsewhere classified (R26.2)     Time: 0211-1552 PT Time Calculation (min) (ACUTE ONLY): 27 min  Charges:  $Gait Training: 23-37 mins                     [place] Office [phone] Weekend pager-[phone]    Claretha Cooper 01/10/2022, 12:34 PM

## 2022-01-11 ENCOUNTER — Inpatient Hospital Stay (HOSPITAL_COMMUNITY): Payer: Medicare HMO

## 2022-01-11 DIAGNOSIS — I5031 Acute diastolic (congestive) heart failure: Secondary | ICD-10-CM | POA: Diagnosis not present

## 2022-01-11 DIAGNOSIS — J206 Acute bronchitis due to rhinovirus: Secondary | ICD-10-CM | POA: Diagnosis not present

## 2022-01-11 DIAGNOSIS — F310 Bipolar disorder, current episode hypomanic: Secondary | ICD-10-CM | POA: Diagnosis not present

## 2022-01-11 DIAGNOSIS — U071 COVID-19: Secondary | ICD-10-CM | POA: Diagnosis not present

## 2022-01-11 LAB — CBC WITH DIFFERENTIAL/PLATELET
Abs Immature Granulocytes: 0.5 10*3/uL — ABNORMAL HIGH (ref 0.00–0.07)
Basophils Absolute: 0.1 10*3/uL (ref 0.0–0.1)
Basophils Relative: 0 %
Eosinophils Absolute: 0.1 10*3/uL (ref 0.0–0.5)
Eosinophils Relative: 1 %
HCT: 39.9 % (ref 36.0–46.0)
Hemoglobin: 12.8 g/dL (ref 12.0–15.0)
Immature Granulocytes: 3 %
Lymphocytes Relative: 19 %
Lymphs Abs: 3.1 10*3/uL (ref 0.7–4.0)
MCH: 31.8 pg (ref 26.0–34.0)
MCHC: 32.1 g/dL (ref 30.0–36.0)
MCV: 99 fL (ref 80.0–100.0)
Monocytes Absolute: 1.1 10*3/uL — ABNORMAL HIGH (ref 0.1–1.0)
Monocytes Relative: 7 %
Neutro Abs: 11.9 10*3/uL — ABNORMAL HIGH (ref 1.7–7.7)
Neutrophils Relative %: 70 %
Platelets: 203 10*3/uL (ref 150–400)
RBC: 4.03 MIL/uL (ref 3.87–5.11)
RDW: 13 % (ref 11.5–15.5)
WBC: 16.8 10*3/uL — ABNORMAL HIGH (ref 4.0–10.5)
nRBC: 0 % (ref 0.0–0.2)

## 2022-01-11 LAB — GLUCOSE, CAPILLARY
Glucose-Capillary: 98 mg/dL (ref 70–99)
Glucose-Capillary: 93 mg/dL (ref 70–99)
Glucose-Capillary: 130 mg/dL — ABNORMAL HIGH (ref 70–99)
Glucose-Capillary: 164 mg/dL — ABNORMAL HIGH (ref 70–99)
Glucose-Capillary: 225 mg/dL — ABNORMAL HIGH (ref 70–99)

## 2022-01-11 MED ORDER — ROSUVASTATIN CALCIUM 20 MG PO TABS
20.0000 mg | ORAL_TABLET | Freq: Every day | ORAL | Status: DC
  Administered 2022-01-15: 20 mg via ORAL
  Filled 2022-01-11: qty 1

## 2022-01-11 MED ORDER — PREDNISONE 5 MG PO TABS
30.0000 mg | ORAL_TABLET | Freq: Every day | ORAL | Status: DC
  Administered 2022-01-12 – 2022-01-13 (×2): 30 mg via ORAL
  Filled 2022-01-11 (×2): qty 2

## 2022-01-11 NOTE — TOC PASRR Note (Signed)
[place] Note   Patient Details  Name: Crystal Duarte Date of Birth: [birth year]   Transition of Care [facility]) CM/SW Contact:    Ross Ludwig, LCSW Phone Number: 01/11/2022, 9:58 AM  To Whom It May Concern:  Please be advised that this patient will require a short-term nursing home stay - anticipated 30 days or less for rehabilitation and strengthening.   The plan is for return home.

## 2022-01-11 NOTE — Progress Notes (Signed)
Pt arrived to unit. Vitals stable. Notified provider.

## 2022-01-11 NOTE — Progress Notes (Signed)
Patient declines nocturnal BiPAP tonight. RT will continue to follow and encourage use.

## 2022-01-11 NOTE — TOC Progression Note (Addendum)
Transition of Care [facility]) - Progression Note    Patient Details  Name: Crystal Duarte MRN: [id number] Date of Birth: November 28, 1960  Transition of Care [facility]) CM/SW Contact  Ross Ludwig, [place] Phone Number: 01/11/2022, 1:08 PM  Clinical Narrative:     Patient's Level 2 [place] number is back [id number] E expiration 02/10/2022.  Patient has been faxed out to SNFs awaiting bed offers.  CSW attempted to contact patient to present bed offers, CSW had to leave a message awaiting for a call back from patient.  CSW to try to reach patient at a later time.  1:30pm  CSW spoke to patient, and she is considering [facility] for rehab.  She is going to talk it over with her daughter and then have her daughter call CSW back.  CSW explained the insurance approval process, what to expect, and options if she is denied by insurance.  CSW explained a decision would have to be made so the insurance authorization process can be started.  CSW to continue to follow patient's progress throughout discharge planning.  3:00pm  CSW received phone call back from patient's daughter [place].  CSW explained process and what to expect at SNF.  Patient's daughter asked several questions, CSW answered requested questions.  Patient's daughter will discuss facility options with patient.  CSW offered to look at different facilities in other counties daughter will discuss with patient.  CSW asked daughter to call CSW back as soon as they discuss facility options.  3:45pm  CSW received call back from patient's daughter they have chosen [facility] for rehab.  CSW called Micheline Chapman from [facility] intake admissions, [phone] to inform her of patient choosing her facility.    CSW notified Rockne Menghini [facility] assistant to start insurance authorization.  Patient was Covid+ on 9/30 and 8/11    Expected Discharge Plan and Services    SNF for short term rehab.                                              Social Determinants of Health (SDOH) Interventions    Readmission Risk Interventions    01/07/2022    9:38 AM  Readmission Risk Prevention Plan  Post Dischage Appt Complete  Medication Screening Complete  Transportation Screening Complete

## 2022-01-11 NOTE — Telephone Encounter (Signed)
Patient scheduled with Dr. Annamaria Boots on 11/7 at 1:30pm. Reminder mailed to address on file-  nothing further needed.

## 2022-01-11 NOTE — Care Management Important Message (Signed)
Important Message  Patient Details IM Letter given. Name: Crystal Duarte MRN: [id number] Date of Birth: 1960-05-11   Medicare Important Message Given:  Yes     Kerin Salen 01/11/2022, 11:01 AM

## 2022-01-11 NOTE — NC FL2 (Signed)
[place] LEVEL OF CARE SCREENING TOOL     IDENTIFICATION  Patient Name: Crystal Duarte Birthdate: 01/04/1961 Sex: female Admission Date (Current Location): 01/01/2022  [facility] and [place] Number:  Herbalist and Address:  [facility],  [address], [place]      Provider Number: [id number]  Attending Physician Name and Address:  Hosie Poisson, MD  Relative Name and Phone Number:  Alaxander,Brittany Daughter [phone]  [phone]  Conrad Terral [phone]    Current Level of Care: Hospital Recommended Level of Care: [place] Prior Approval Number:    Date Approved/Denied:   PASRR Number: Pending  Discharge Plan: SNF    Current Diagnoses: Patient Active Problem List   Diagnosis Date Noted   COPD exacerbation ([place])    COVID-19    Acute respiratory failure with hypoxia ([place]) 54/00/8676   Acute diastolic CHF (congestive heart failure) ([place]) 01/03/2022   Obesity (BMI 30-39.9) 01/02/2022   Acute bronchitis due to Rhinovirus 01/02/2022   Pneumonia due to COVID-19 virus 01/01/2022   Chest wall tenderness 12/19/2021   Chest pain in adult 10/15/2021   Shortness of breath 10/15/2021   Palpitations 02/12/2021   Cough 02/02/2021   Wheezing 02/02/2021   Cold sore 02/02/2021   Yeast infection of the skin 02/02/2021   Aortic atherosclerosis ([place]) 11/04/2020   Family history of early CAD 11/04/2020   Elevated random blood glucose level 10/16/2020   Hypertriglyceridemia 03/02/2020   PAD (peripheral artery disease) ([place]) 03/02/2020   MDD (major depressive disorder), recurrent, in full remission ([place]) 11/12/2019   Elevated blood-pressure reading, without diagnosis of hypertension 01/14/2019   Neck pain 01/14/2019   Ulnar neuropathy at elbow of right upper extremity 01/14/2019   Cervical radiculopathy 11/15/2018   Myofascial pain 11/15/2018   Post-traumatic osteoarthritis of left wrist 05/01/2018    Closed displaced fracture of styloid process of left ulna 10/20/2017   Closed fracture of left distal radius 10/20/2017   Generalized anxiety disorder 09/07/2017   Severe episode of recurrent major depressive disorder, without psychotic features ([place]) 07/18/2017   Allergic rhinitis 05/23/2017   Vitamin D deficiency 05/23/2017   Recurrent major depression-severe ([place]) 05/22/2017   DOE (dyspnea on exertion) 12/26/2016   COPD  GOLD 2/ still smoking  12/26/2016   Routine general medical examination at a health care facility 10/09/2015   Memory loss 10/04/2014   Affective bipolar disorder ([place]) 03/12/2014   Cigarette smoker 08/28/2012   Multiple pulmonary nodules 11/12/2010    Orientation RESPIRATION BLADDER Height & Weight     Self, Time, Situation, Place  O2 (2L) Continent Weight: 193 lb 12.6 oz (87.9 kg) Height:  '5\' 4"'$  (162.6 cm)  BEHAVIORAL SYMPTOMS/MOOD NEUROLOGICAL BOWEL NUTRITION STATUS      Continent Diet  AMBULATORY STATUS COMMUNICATION OF NEEDS Skin   Limited Assist Verbally Normal                       Personal Care Assistance Level of Assistance  Bathing, Dressing, Feeding Bathing Assistance: Limited assistance Feeding assistance: Independent Dressing Assistance: Limited assistance     Functional Limitations Info  Sight, Hearing, Speech Sight Info: Adequate Hearing Info: Adequate Speech Info: Adequate    SPECIAL CARE FACTORS FREQUENCY  PT (By licensed PT), OT (By licensed OT)     PT Frequency: Minimum 5x a week OT Frequency: Minimum 5x a week            Contractures Contractures Info: Not  present    Additional Factors Info  Code Status, Allergies, Psychotropic, Insulin Sliding Scale, Isolation Precautions Code Status Info: Partial Allergies Info: Other   Procaine   Latuda (Lurasidone Hcl)   Sulfa Antibiotics Psychotropic Info: busPIRone (BUSPAR) tablet 2.5 mg, busPIRone (BUSPAR) tablet 20 mg, venlafaxine XR (EFFEXOR-XR) 24 hr capsule 150 mg,  venlafaxine XR (EFFEXOR-XR) 24 hr capsule 75 mg Insulin Sliding Scale Info: insulin aspart (novoLOG) injection 0-15 Units Isolation Precautions Info: Droplet precautions     Current Medications (01/11/2022):  This is the current hospital active medication list Current Facility-Administered Medications  Medication Dose Route Frequency Provider Last Rate Last Admin   acetaminophen (TYLENOL) tablet 650 mg  650 mg Oral Q6H PRN Hosie Poisson, MD   650 mg at 01/09/22 2112   amLODipine (NORVASC) tablet 5 mg  5 mg Oral Daily Hosie Poisson, MD   5 mg at 01/10/22 1237   arformoterol (BROVANA) nebulizer solution 15 mcg  15 mcg Nebulization BID Cristal Generous, NP   15 mcg at 01/11/22 0750   ascorbic acid (VITAMIN C) tablet 500 mg  500 mg Oral Daily Hosie Poisson, MD   500 mg at 01/10/22 0906   bisacodyl (DULCOLAX) EC tablet 5 mg  5 mg Oral Daily PRN Kathryne Eriksson, NP       budesonide (PULMICORT) nebulizer solution 0.25 mg  0.25 mg Nebulization BID Cristal Generous, NP   0.25 mg at 01/11/22 0750   busPIRone (BUSPAR) tablet 2.5 mg  2.5 mg Oral Daily Hosie Poisson, MD   2.5 mg at 01/10/22 0904   [START ON 01/14/2022] busPIRone (BUSPAR) tablet 20 mg  20 mg Oral TID Hosie Poisson, MD       Chlorhexidine Gluconate Cloth 2 % PADS 6 each  6 each Topical Daily Hosie Poisson, MD   6 each at 01/10/22 1455   dextromethorphan-guaiFENesin (Pine DM) 30-600 MG per 12 hr tablet 1 tablet  1 tablet Oral BID Hosie Poisson, MD   1 tablet at 01/10/22 2116   enoxaparin (LOVENOX) injection 40 mg  40 mg Subcutaneous Q24H Hosie Poisson, MD   40 mg at 01/10/22 1237   fluticasone (FLONASE) 50 MCG/ACT nasal spray 2 spray  2 spray Each Nare Daily Hosie Poisson, MD   2 spray at 01/10/22 0904   gabapentin (NEURONTIN) capsule 400 mg  400 mg Oral BID Hosie Poisson, MD   400 mg at 01/10/22 1637   gabapentin (NEURONTIN) capsule 600 mg  600 mg Oral QHS Hosie Poisson, MD   600 mg at 01/10/22 2116   guaiFENesin-dextromethorphan  (ROBITUSSIN DM) 100-10 MG/5ML syrup 5 mL  5 mL Oral Q4H PRN Hosie Poisson, MD   5 mL at 01/10/22 0405   hydrALAZINE (APRESOLINE) injection 10 mg  10 mg Intravenous Q6H PRN Hosie Poisson, MD   10 mg at 01/09/22 1718   hydrALAZINE (APRESOLINE) tablet 25 mg  25 mg Oral Q8H Hosie Poisson, MD   25 mg at 01/11/22 0556   hydrocortisone cream 1 %   Topical QID PRN Hosie Poisson, MD       hydrOXYzine (ATARAX) tablet 25 mg  25 mg Oral TID PRN Hosie Poisson, MD       influenza vac split quadrivalent PF (FLUARIX) injection 0.5 mL  0.5 mL Intramuscular Tomorrow-1000 Allie Bossier, MD       insulin aspart (novoLOG) injection 0-15 Units  0-15 Units Subcutaneous Q4H Hosie Poisson, MD   2 Units at 01/11/22 0008   ipratropium-albuterol (DUONEB) 0.5-2.5 (3)  MG/3ML nebulizer solution 3 mL  3 mL Nebulization Q4H PRN Cristal Generous, NP   3 mL at 01/10/22 2017   loratadine (CLARITIN) tablet 10 mg  10 mg Oral Daily Hosie Poisson, MD   10 mg at 01/10/22 0905   LORazepam (ATIVAN) tablet 1 mg  1 mg Oral Q4H PRN Hosie Poisson, MD   1 mg at 01/10/22 2130   montelukast (SINGULAIR) tablet 10 mg  10 mg Oral QHS Hosie Poisson, MD   10 mg at 01/10/22 2118   nystatin (MYCOSTATIN) 100000 UNIT/ML suspension 500,000 Units  5 mL Oral QID Hosie Poisson, MD   500,000 Units at 01/10/22 2121   Oral care mouth rinse  15 mL Mouth Rinse PRN Hosie Poisson, MD       Oral care mouth rinse  15 mL Mouth Rinse 4 times per day Hosie Poisson, MD   15 mL at 01/11/22 0934   pantoprazole (PROTONIX) EC tablet 40 mg  40 mg Oral Daily Hosie Poisson, MD   40 mg at 01/10/22 0905   predniSONE (DELTASONE) tablet 40 mg  40 mg Oral QAC breakfast Hosie Poisson, MD   40 mg at 01/10/22 0906   revefenacin (YUPELRI) nebulizer solution 175 mcg  175 mcg Nebulization Daily Freda Jackson B, MD   175 mcg at 01/10/22 0824   [START ON 01/15/2022] rosuvastatin (CRESTOR) tablet 20 mg  20 mg Oral Daily Hosie Poisson, MD       sodium chloride (OCEAN) 0.65 % nasal spray 1  spray  1 spray Each Nare PRN Hosie Poisson, MD   1 spray at 01/10/22 1240   traZODone (DESYREL) tablet 250 mg  250 mg Oral QHS Hosie Poisson, MD   250 mg at 01/10/22 2119   venlafaxine XR (EFFEXOR-XR) 24 hr capsule 150 mg  150 mg Oral Q breakfast Hosie Poisson, MD   150 mg at 01/10/22 0905   venlafaxine XR (EFFEXOR-XR) 24 hr capsule 75 mg  75 mg Oral Q breakfast Hosie Poisson, MD   75 mg at 01/10/22 6803   zinc sulfate capsule 220 mg  220 mg Oral Daily Hosie Poisson, MD   220 mg at 01/10/22 2122     Discharge Medications: Please see discharge summary for a list of discharge medications.  Relevant Imaging Results:  Relevant Lab Results:   Additional Information SSN [id number]  Ross Ludwig, LCSW

## 2022-01-11 NOTE — Plan of Care (Signed)
  Problem: Health Behavior/Discharge Planning: Goal: Ability to manage health-related needs will improve Outcome: Progressing   Problem: Clinical Measurements: Goal: Ability to maintain clinical measurements within normal limits will improve Outcome: Progressing   

## 2022-01-11 NOTE — Progress Notes (Signed)
Occupational Therapy Treatment Patient Details Name: Crystal Duarte MRN: [id number] DOB: 09-Jun-1960 Today's Date: 01/11/2022   History of present illness [name] PMHx COPD,Depression, HLD, Presents to the emergency department with shortness of breath.  The patient states that she had COVID in early August of this year. She presents with  sob, productive cough,. She was admitted for acute respiratory failure with hypoxia. Her RVP is positive for rhino virus.   OT comments  Patient was motivated to participate in the session. She performed most tasks with SBA, including grooming at sink level, upper body dressing, and ambulating a short distance using a RW. She required cues for implementing therapeutic rest breaks and pursed lip breathing exercises as needed, given shortness of breath, deconditioning, and intermittent dizziness with activity. She remains on 2L O2 via nasal cannula. She is making gradual functional progress. She will continue to benefit from further OT services to maximize her independence with self-care tasks. She expressed concern about managing her care at home, as she lives alone, therefore short-term SNF is recommended at current.    Recommendations for follow up therapy are one component of a multi-disciplinary discharge planning process, led by the attending physician.  Recommendations may be updated based on patient status, additional functional criteria and insurance authorization.    Follow Up Recommendations  Skilled nursing-short term rehab (<3 hours/day)    Assistance Recommended at Discharge Intermittent Supervision/Assistance  Patient can return home with the following  A little help with bathing/dressing/bathroom;Assist for transportation;Assistance with cooking/housework   Equipment Recommendations    Bedside commode   Recommendations for Other Services      Precautions / Restrictions Precautions Precautions: Fall Precaution Comments: droplet,  on 2L O2 via [place] Restrictions Weight Bearing Restrictions: No       Mobility Bed Mobility   Bed Mobility: Supine to Sit     Supine to sit: HOB elevated, Supervision          Transfers Overall transfer level: Needs assistance Equipment used: Rolling walker (2 wheels) Transfers: Sit to/from Stand Sit to Stand: Supervision        Balance     Sitting balance-Leahy Scale: Good         Standing balance comment: SBA in static standing. SBA with RW for dynamic standing           ADL either performed or assessed with clinical judgement   ADL Overall ADL's : Needs assistance/impaired Eating/Feeding: Independent Eating/Feeding Details (indicate cue type and reason): based on clinical judgement Grooming: Standing;Wash/dry face;Wash/dry hands;Oral care Grooming Details (indicate cue type and reason): She performed face washing, hand washing, and teeth brushing standing at the sink. She required verbal cues for implementing deep breathing exercises, for improved activity tolerance, given shortness of breath with activity.         Upper Body Dressing : Set up;Standing                        Cognition Arousal/Alertness: Awake/alert Behavior During Therapy: WFL for tasks assessed/performed Overall Cognitive Status: Within Functional Limits for tasks assessed          General Comments: Oriented x4, cooperative, able to follow commands without difficulty                   Pertinent Vitals/ Pain       Pain Assessment Pain Assessment: No/denies pain (She denied having pain, however reported having chest discomfort)  Frequency  Min 2X/week        Progress Toward Goals  OT Goals(current goals can now be found in the care plan section)  Progress towards OT goals: Progressing toward goals  Acute Rehab OT Goals Patient Stated Goal: to get better OT Goal Formulation: With patient Time For Goal Achievement: 01/23/22 Potential to Achieve  Goals: Good  Plan         AM-PAC OT "6 Clicks" Daily Activity     Outcome Measure   Help from another person eating meals?: None Help from another person taking care of personal grooming?: A Little Help from another person toileting, which includes using toliet, bedpan, or urinal?: A Little Help from another person bathing (including washing, rinsing, drying)?: A Little Help from another person to put on and taking off regular upper body clothing?: None Help from another person to put on and taking off regular lower body clothing?: A Little 6 Click Score: 20    End of Session Equipment Utilized During Treatment: Rolling walker (2 wheels)  OT Visit Diagnosis: Unsteadiness on feet (R26.81);Muscle weakness (generalized) (M62.81)   Activity Tolerance Other (comment) (fair+ overall tolerance; patient limited by dizziness with activity)   Patient Left with call bell/phone within reach;in chair   Nurse Communication Mobility status;Other (comment)        Time: 9758-8325 OT Time Calculation (min): 33 min  Charges: OT General Charges $OT Visit: 1 Visit OT Treatments $Self Care/Home Management : 8-22 mins $Therapeutic Activity: 8-22 mins    Leota Sauers, OTR/L 01/11/2022, 10:19 AM

## 2022-01-11 NOTE — Progress Notes (Signed)
PROGRESS NOTE    Crystal Duarte  WLS:[id number] DOB: 1960/11/18 DOA: 01/01/2022 PCP: [name], MD    Chief Complaint  Patient presents with   Shortness of Breath    Brief Narrative:   [name] PMHx COPD,Depression, HLD, Presents to the emergency department with shortness of breath.  The patient states that she had COVID in early August of this year. She presents with  sob, productive cough,. She was admitted for acute respiratory failure with hypoxia. Her RVP is positive for rhino virus. She was started on IV solumedrol, completed Remdesevir.  PCCM consulted as she continues to worsen with worsening cough, sob,and diffuse wheezing despite being on steroids and neb treatments.  CTA of the chest is negative for PE study. She was started on BIPAP and she reports marked improvement in the breathing.  She still continues to have diffuse wheezing, but better than the last 2 days.  She was saturating 98% at rest on 2lit of [place] oxygen. Pt seen and examined, reports feeling much better and looking forward to being discharged.     Assessment & Plan:   Principal Problem:   Pneumonia due to COVID-19 virus Active Problems:   Affective bipolar disorder (Interior)   Severe episode of recurrent major depressive disorder, without psychotic features (HCC)   PAD (peripheral artery disease) (HCC)   Generalized anxiety disorder   Chest pain in adult   Shortness of breath   Obesity (BMI 30-39.9)   Acute bronchitis due to Rhinovirus   Acute diastolic CHF (congestive heart failure) (HCC)   Acute respiratory failure with hypoxia (HCC)   COPD exacerbation (HCC)   COVID-19   Acute respiratory failure with hypoxia probably secondary to COVID-pneumonia/rhinovirus acute bronchitis/ acute copd exacerbation.  Completed the course of remdesivir and paxlovid.  PCCM consulted for worsening breathing and diffuse wheezing.  She was started on IV solumedrol 60 mg daily,transitioned to oral  steroids, recommend slow taper of prednisone.  Continue with incentive spirometry and flutter valves Patient using BIPAP at night.  CTA of the chest is negative for PE, effusion and consolidation.  She continues to improve, but slowly.     Generalized anxiety disorder Continue with BuSpar ,  and Ativan 1 mg twice daily, .will increase the frequency of ativan and add atarax, and discontinue the propranolol as she has diffuse wheezing, can be restarted at a later time once she improves from the respiratory illness.  Well controlled.     Chest pain Appears to have resolved . Echocardiogram showed Left ventricular ejection fraction, by estimation, is 70 to 75%. The left ventricle has hyperdynamic function. The left ventricle has no regional wall motion abnormalities. Left ventricular diastolic parameters are consistent with Grade I diastolic dysfunction (impaired relaxation).    Essential hypertension Blood pressure parameters slightly elevated , added hydralazine 25 mg TID.  Added norvasc 5 mg daily.     Chronic diastolic heart failure Continue to monitor. CTA neg for effusion.  Does not appear to be decompensated.    Body mass index is 33.26 kg/m. Obesity:    Hypokalemia:  Replaced. Repeat levels wnl.     Nasal congestion;  Added Afrin spray.   Leukocytosis:  Possibly from steroids.   Therapy eval recommended SNF.    DVT prophylaxis: Lovenox Code Status: Partial code Family Communication: None at bedside.  Disposition:   Status is: Inpatient Remains inpatient appropriate because: SNF placement.     Level of care: Progressive Consultants:  PCCM.  Procedures: None  Antimicrobials:  Antibiotics Given (last 72 hours)     Date/Time Action Medication Dose   01/08/22 2121 Given   nirmatrelvir/ritonavir EUA (PAXLOVID) 3 tablet 3 tablet   01/09/22 0945 Given   nirmatrelvir/ritonavir EUA (PAXLOVID) 3 tablet 3 tablet   01/09/22 2125 Given    nirmatrelvir/ritonavir EUA (PAXLOVID) 3 tablet 3 tablet        Subjective: Breathing and wheezing has improved. No chest pain . No nausea, vomiting or abdominal pain.   Objective: Vitals:   01/11/22 0549 01/11/22 0750 01/11/22 1110 01/11/22 1204  BP: 137/61  112/61 (!) 122/59  Pulse: 87   98  Resp: 20     Temp: 98.7 F (37.1 C)   98.7 F (37.1 C)  TempSrc: Oral     SpO2: 95% 96%  93%  Weight:      Height:        Intake/Output Summary (Last 24 hours) at 01/11/2022 1642 Last data filed at 01/11/2022 1000 Gross per 24 hour  Intake 600 ml  Output 2550 ml  Net -1950 ml    Filed Weights   01/05/22 1345 01/07/22 0600 01/09/22 0356  Weight: 86.9 kg 87.3 kg 87.9 kg    Examination:  General exam: Appears calm and comfortable  Respiratory system: scattered wheezing, no rhonchi, air entry fair.  Cardiovascular system: S1 & S2 heard, RRR. No JVD, No pedal edema. Gastrointestinal system: Abdomen is nondistended, soft and nontender. Normal bowel sounds heard. Central nervous system: Alert and oriented. No focal neurological deficits. Extremities: Symmetric 5 x 5 power. Skin: No rashes, lesions or ulcers Psychiatry:  Mood & affect appropriate.         Data Reviewed: I have personally reviewed following labs and imaging studies  CBC: Recent Labs  Lab 01/05/22 1033 01/06/22 0307 01/07/22 0311 01/09/22 0304 01/11/22 0503  WBC 14.0* 14.7* 18.3* 17.3* 16.8*  NEUTROABS 8.7* 11.1* 15.1* 14.3* 11.9*  HGB 12.9 13.1 13.7 13.0 12.8  HCT 40.0 39.9 42.1 40.3 39.9  MCV 99.3 97.6 97.7 98.3 99.0  PLT 226 226 264 253 203     Basic Metabolic Panel: Recent Labs  Lab 01/05/22 1033 01/06/22 0307 01/07/22 0311 01/08/22 0304 01/09/22 0304  NA 139 140 141 138 140  K 3.5 3.8 3.5 3.3* 3.9  CL 98 102 102 98 100  CO2 33* 31 31 32 34*  GLUCOSE 119* 117* 109* 160* 158*  BUN '16 14 18 16 16  '$ CREATININE 0.69 0.67 0.85 0.79 0.88  CALCIUM 8.4* 8.5* 9.0 8.6* 8.7*  MG 2.2 2.7* 2.3  2.2  --   PHOS 4.0 4.5 4.5  --   --      GFR: Estimated Creatinine Clearance: 71.2 mL/min (by C-G formula based on SCr of 0.88 mg/dL).  Liver Function Tests: Recent Labs  Lab 01/05/22 1033 01/06/22 0307 01/07/22 0311 01/08/22 0304  AST '20 17 15 15  '$ ALT '19 19 20 17  '$ ALKPHOS 50 54 66 55  BILITOT 0.4 0.5 0.6 0.5  PROT 6.0* 5.8* 6.2* 5.7*  ALBUMIN 3.3* 3.2* 3.4* 3.1*     CBG: Recent Labs  Lab 01/10/22 2328 01/11/22 0545 01/11/22 0744 01/11/22 1201 01/11/22 1638  GLUCAP 126* 98 93 130* 164*      Recent Results (from the past 240 hour(s))  Culture, blood (Routine X 2) w Reflex to ID Panel     Status: None   Collection Time: 01/01/22  4:51 PM   Specimen: BLOOD  Result Value Ref Range Status   Specimen  Description   Final    BLOOD LEFT ANTECUBITAL Performed at [facility], [place] [address] [place], [place] [address]    Special Requests   Final    BOTTLES DRAWN AEROBIC ONLY Blood Culture adequate volume Performed at [place] [address], [place], [place] [address]    Culture   Final    NO GROWTH 5 DAYS Performed at [facility], [place] [address] [place], [place] [address]    Report Status 01/06/2022 FINAL  Final  Culture, blood (Routine X 2) w Reflex to ID Panel     Status: None   Collection Time: 01/01/22  4:51 PM   Specimen: BLOOD LEFT FOREARM  Result Value Ref Range Status   Specimen Description   Final    BLOOD LEFT FOREARM Performed at [facility] [address] [place], [place] [address]    Special Requests   Final    BOTTLES DRAWN AEROBIC ONLY Blood Culture adequate volume Performed at [place] [address] [place], [place] [address]    Culture   Final    NO GROWTH 5 DAYS Performed at [facility], [place] [address], [place], [place] [address]    Report Status 01/06/2022 FINAL  Final  Respiratory (~20 pathogens) panel by PCR     Status: Abnormal   Collection  Time: 01/01/22  5:31 PM   Specimen: Nasopharyngeal Swab; Respiratory  Result Value Ref Range Status   Adenovirus NOT DETECTED NOT DETECTED Final   Coronavirus 229E NOT DETECTED NOT DETECTED Final    Comment: (NOTE) The Coronavirus on the Respiratory Panel, DOES NOT test for the novel  Coronavirus (2019 nCoV)    Coronavirus HKU1 NOT DETECTED NOT DETECTED Final   Coronavirus NL63 NOT DETECTED NOT DETECTED Final   Coronavirus OC43 NOT DETECTED NOT DETECTED Final   Metapneumovirus NOT DETECTED NOT DETECTED Final   Rhinovirus / Enterovirus DETECTED (A) NOT DETECTED Final   Influenza A NOT DETECTED NOT DETECTED Final   Influenza B NOT DETECTED NOT DETECTED Final   Parainfluenza Virus 1 NOT DETECTED NOT DETECTED Final   Parainfluenza Virus 2 NOT DETECTED NOT DETECTED Final   Parainfluenza Virus 3 NOT DETECTED NOT DETECTED Final   Parainfluenza Virus 4 NOT DETECTED NOT DETECTED Final   Respiratory Syncytial Virus NOT DETECTED NOT DETECTED Final   Bordetella pertussis NOT DETECTED NOT DETECTED Final   Bordetella Parapertussis NOT DETECTED NOT DETECTED Final   Chlamydophila pneumoniae NOT DETECTED NOT DETECTED Final   Mycoplasma pneumoniae NOT DETECTED NOT DETECTED Final    Comment: Performed at [facility], [place] [address] [place], [place] [address]  MRSA Next Gen by PCR, Nasal     Status: None   Collection Time: 01/01/22  7:02 PM   Specimen: Nasal Mucosa; Nasal Swab  Result Value Ref Range Status   MRSA by PCR Next Gen NOT DETECTED NOT DETECTED Final    Comment: (NOTE) The GeneXpert MRSA Assay (FDA approved for NASAL specimens only), is one component of a comprehensive MRSA colonization surveillance program. It is not intended to diagnose MRSA infection nor to guide or monitor treatment for MRSA infections. Test performance is not FDA approved in patients less than 50 years old. Performed at [facility], [place] [address] [place], [place] [address]   Culture,  blood (Routine X 2) w Reflex to ID Panel     Status: None   Collection Time: 01/01/22  7:37 PM   Specimen: BLOOD LEFT HAND  Result Value Ref Range Status   Specimen Description   Final    BLOOD LEFT HAND Performed at [place] [address], [place], [place] [address]    Special Requests   Final    BOTTLES DRAWN AEROBIC AND ANAEROBIC Blood Culture adequate volume Performed at [place] [address], [place], [place] [address]    Culture   Final    NO GROWTH 5 DAYS Performed at [facility], [place] [address] [place], [place] [address]    Report Status 01/06/2022 FINAL  Final  Culture, blood (Routine X 2) w Reflex to ID Panel     Status: None   Collection Time: 01/01/22  7:44 PM   Specimen: BLOOD RIGHT HAND  Result Value Ref Range Status   Specimen Description   Final    BLOOD RIGHT HAND Performed at [place] [address] [place], [place] [address]    Special Requests   Final    BOTTLES DRAWN AEROBIC AND ANAEROBIC Blood Culture adequate volume Performed at [place] [address], [place], [place] [address]    Culture   Final    NO GROWTH 5 DAYS Performed at [facility], [place] [address] [place], [place] [address]    Report Status 01/06/2022 FINAL  Final         Radiology Studies: No results found.      Scheduled Meds:  amLODipine  5 mg Oral Daily   arformoterol  15 mcg Nebulization BID   ascorbic acid  500 mg Oral Daily   budesonide (PULMICORT) nebulizer solution  0.25 mg Nebulization BID   busPIRone  2.5 mg Oral Daily   [START ON 01/14/2022] busPIRone  20 mg Oral TID   Chlorhexidine Gluconate Cloth  6 each Topical Daily   dextromethorphan-guaiFENesin  1 tablet Oral BID   enoxaparin (LOVENOX) injection  40 mg Subcutaneous Q24H   fluticasone  2 spray Each Nare Daily   gabapentin  400 mg Oral BID   gabapentin  600 mg Oral QHS   hydrALAZINE  25 mg Oral  Q8H   influenza vac split quadrivalent PF  0.5 mL Intramuscular Tomorrow-1000   insulin aspart  0-15 Units Subcutaneous Q4H   loratadine  10 mg Oral Daily   montelukast  10 mg Oral QHS   nystatin  5 mL Oral QID   mouth rinse  15 mL Mouth Rinse 4 times per day   pantoprazole  40 mg Oral Daily   revefenacin  175 mcg Nebulization Daily   [START ON 01/15/2022] rosuvastatin  20 mg Oral Daily   traZODone  250 mg Oral QHS   venlafaxine XR  150 mg Oral Q breakfast   venlafaxine XR  75 mg Oral Q breakfast   zinc sulfate  220 mg Oral Daily   Continuous Infusions:   LOS: 10 days        Hosie Poisson, MD [facility]   To contact the attending provider between 7A-7P or the covering provider during after hours 7P-7A, please log into the web site [email or web address] and access using universal [place] password for that web site. If you do not have the password, please call the hospital operator.  01/11/2022, 4:42 PM

## 2022-01-12 DIAGNOSIS — J9601 Acute respiratory failure with hypoxia: Secondary | ICD-10-CM

## 2022-01-12 DIAGNOSIS — J206 Acute bronchitis due to rhinovirus: Secondary | ICD-10-CM | POA: Diagnosis not present

## 2022-01-12 DIAGNOSIS — I5031 Acute diastolic (congestive) heart failure: Secondary | ICD-10-CM | POA: Diagnosis not present

## 2022-01-12 DIAGNOSIS — U071 COVID-19: Secondary | ICD-10-CM | POA: Diagnosis not present

## 2022-01-12 LAB — GLUCOSE, CAPILLARY
Glucose-Capillary: 111 mg/dL — ABNORMAL HIGH (ref 70–99)
Glucose-Capillary: 123 mg/dL — ABNORMAL HIGH (ref 70–99)
Glucose-Capillary: 123 mg/dL — ABNORMAL HIGH (ref 70–99)
Glucose-Capillary: 134 mg/dL — ABNORMAL HIGH (ref 70–99)
Glucose-Capillary: 167 mg/dL — ABNORMAL HIGH (ref 70–99)
Glucose-Capillary: 85 mg/dL (ref 70–99)

## 2022-01-12 MED ORDER — INSULIN ASPART 100 UNIT/ML IJ SOLN
0.0000 [IU] | Freq: Three times a day (TID) | INTRAMUSCULAR | Status: DC
  Administered 2022-01-12: 3 [IU] via SUBCUTANEOUS
  Administered 2022-01-13: 7 [IU] via SUBCUTANEOUS
  Administered 2022-01-14: 4 [IU] via SUBCUTANEOUS

## 2022-01-12 MED ORDER — INSULIN ASPART 100 UNIT/ML IJ SOLN: 0.0000 [IU] | Freq: Every day | INTRAMUSCULAR | Status: DC

## 2022-01-12 MED ORDER — GUAIFENESIN ER 600 MG PO TB12
1200.0000 mg | ORAL_TABLET | Freq: Two times a day (BID) | ORAL | Status: DC
  Administered 2022-01-12 – 2022-01-15 (×6): 1200 mg via ORAL
  Filled 2022-01-12 (×6): qty 2

## 2022-01-12 MED ORDER — IPRATROPIUM-ALBUTEROL 0.5-2.5 (3) MG/3ML IN SOLN
3.0000 mL | Freq: Four times a day (QID) | RESPIRATORY_TRACT | Status: DC
  Administered 2022-01-13 – 2022-01-14 (×6): 3 mL via RESPIRATORY_TRACT
  Filled 2022-01-12 (×5): qty 3

## 2022-01-12 MED ORDER — IPRATROPIUM-ALBUTEROL 0.5-2.5 (3) MG/3ML IN SOLN
3.0000 mL | Freq: Four times a day (QID) | RESPIRATORY_TRACT | Status: DC
  Administered 2022-01-12 (×2): 3 mL via RESPIRATORY_TRACT
  Filled 2022-01-12: qty 3

## 2022-01-12 NOTE — Progress Notes (Signed)
Physical Therapy Treatment Patient Details Name: Crystal Duarte MRN: [id number] DOB: [birth year] Today's Date: 01/12/2022   History of Present Illness [name] PMHx COPD,Depression, HLD, Presents to the emergency department with shortness of breath.  The patient states that she had COVID in early August of this year. She presents with  sob, productive cough,. She was admitted for acute respiratory failure with hypoxia. Her RVP is positive for rhino virus.    PT Comments    The patient reports nasal congestion  persists which she reports increases difficulty breathing. Deep nonproductive cough noted. . Patient ambulated on 2 LPM O2, SPO2 97%, 123 max HR. Down to 109 at rest.  SPO2 on RA at rest 92%, Did not try ambulating without O2 this visit. Patient will benefit from Post acute rehab to improve endurance to tolerate independence in All ADL's and mobility.  Recommendations for follow up therapy are one component of a multi-disciplinary discharge planning process, led by the attending physician.  Recommendations may be updated based on patient status, additional functional criteria and insurance authorization.  Follow Up Recommendations  Skilled nursing-short term rehab (<3 hours/day) Can patient physically be transported by private vehicle: Yes   Assistance Recommended at Discharge Set up Supervision/Assistance  Patient can return home with the following A little help with walking and/or transfers;A little help with bathing/dressing/bathroom;Help with stairs or ramp for entrance;Assistance with cooking/housework;Assist for transportation   Equipment Recommendations  Rolling walker (2 wheels)    Recommendations for Other Services       Precautions / Restrictions Precautions Precautions: Fall Precaution Comments: droplet, on 2L O2 via [place] Restrictions Weight Bearing Restrictions: No     Mobility  Bed Mobility Overal bed mobility: Modified Independent                   Transfers Overall transfer level: Needs assistance Equipment used: Rolling walker (2 wheels) Transfers: Sit to/from Stand Sit to Stand: Supervision                Ambulation/Gait Ambulation/Gait assistance: Min assist, Min guard Gait Distance (Feet): 100 Feet with noted increase in RR and increased HR, Standing rest break at halfway mark. Assistive device: Rolling walker (2 wheels), None   Gait velocity: decr     General Gait Details: gait steady with RW, increased WOB and effort without Rw, required to take back the RW for  support   Stairs             Wheelchair Mobility    Modified Rankin (Stroke Patients Only)       Balance Overall balance assessment: Mild deficits observed, not formally tested Sitting-balance support: No upper extremity supported Sitting balance-Leahy Scale: Good     Standing balance support: No upper extremity supported, During functional activity Standing balance-Leahy Scale: Fair Standing balance comment: requires some support for dynamic                            Cognition Arousal/Alertness: Awake/alert Behavior During Therapy: WFL for tasks assessed/performed Overall Cognitive Status: Within Functional Limits for tasks assessed                                          Exercises      General Comments        Pertinent Vitals/Pain Pain Assessment Pain Assessment: No/denies pain  Home Living                          Prior Function            PT Goals (current goals can now be found in the care plan section) Progress towards PT goals: Progressing toward goals    Frequency    Min 2X/week      PT Plan      Co-evaluation              AM-PAC PT "6 Clicks" Mobility   Outcome Measure  Help needed turning from your back to your side while in a flat bed without using bedrails?: None Help needed moving from lying on your back to sitting on the side of a flat  bed without using bedrails?: None Help needed moving to and from a bed to a chair (including a wheelchair)?: None Help needed standing up from a chair using your arms (e.g., wheelchair or bedside chair)?: A Little Help needed to walk in hospital room?: A Little Help needed climbing 3-5 steps with a railing? : A Lot 6 Click Score: 20    End of Session Equipment Utilized During Treatment: Oxygen Activity Tolerance: Patient tolerated treatment well Patient left: in bed;with call bell/phone within reach Nurse Communication: Mobility status PT Visit Diagnosis: Difficulty in walking, not elsewhere classified (R26.2)     Time: 0830-0900 PT Time Calculation (min) (ACUTE ONLY): 30 min  Charges:  $Gait Training: 23-37 mins                     [place] Office [phone] Weekend [phone]    Claretha Cooper 01/12/2022, 9:04 AM

## 2022-01-12 NOTE — Progress Notes (Signed)
Mobility Specialist - Progress Note   01/12/22 1432  Mobility  Range of Motion/Exercises Active;Right arm;Left arm  Activity Response Tolerated well  $Mobility charge 1 Mobility   Pt was found in bed and agreeable to upper body mobility. Pt was given theraband to help increase upper body mobility. Demonstrated how to use and some exercises she could so. At EOS was left on recliner chair with all necessities in reach.  Ferd Hibbs Mobility Specialist

## 2022-01-12 NOTE — Progress Notes (Signed)
Progress Note    Crystal Duarte   HDQ:[id number]  DOB: [birth year]  DOA: 01/01/2022     11 PCP: [name], MD  Initial CC: cough, SOB  Hospital Course: Crystal Duarte is a 61 yo female with PMH ongoing tobacco use, COPD, MDD, colonic polyps who presented with shortness of breath. She was diagnosed with COVID on 11/12/2021.  She has had difficulty recovering although has continued to smoke. Repeat testing on 01/01/2022 was again positive for COVID.  She was started on treatment which she completed during hospitalization.  RVP also obtained during admission which was positive for rhinovirus.  Pulmonology was also consulted. She was also hypoxic during work-up requiring 2 L nasal cannula.  This was slowly weaned as able.  Interval History:  No events overnight.  Continues to have ongoing cough and sputum production.  She is having difficulty clearing her sputum.  Assessment and Plan:  Acute hypoxic respiratory failure COVID-19 infection Acute bronchitis due to rhinovirus Acute COPD exacerbation -CTA chest negative for PE - s/p IV steroids; now on oral taper - s/p 5 days paxlovid -Wean oxygen as able and will need walk test off of oxygen -Continue Brovana, budesonide, Flonase, DuoNebs - Continue prednisone - Continue scheduled Mucinex  -Out of bed as able    Physical deconditioning -Patient having difficulty recovering from recurrent infections - PT following.  Recommendation is for SNF which she is amenable with  Generalized anxiety disorder - Continue with BuSpar and Ativan - propranolol on hold in setting of wheezing     Chest pain Appears to have resolved . Echocardiogram showed Left ventricular ejection fraction, by estimation, is 70 to 75%. The left ventricle has hyperdynamic function. The left ventricle has no regional wall motion abnormalities. Left ventricular diastolic parameters are consistent with Grade I diastolic dysfunction (impaired relaxation).    Essential hypertension Blood pressure parameters slightly elevated , added hydralazine 25 mg TID.  Added norvasc 5 mg daily.   Chronic diastolic heart failure Continue to monitor. CTA neg for effusion.  Does not appear to be decompensated.    Obesity - Complicates overall prognosis and care - Body mass index is 33.26 kg/m.   Hypokalemia:  Replaced.    Nasal congestion Afrin spray.    Leukocytosis - due to steroids.    Old records reviewed in assessment of this patient  Antimicrobials:   DVT prophylaxis:  enoxaparin (LOVENOX) injection 40 mg Start: 01/08/22 1000   Code Status:   Code Status: Partial Code  Mobility Assessment (last 72 hours)     Mobility Assessment     Row Name 01/12/22 0903 01/12/22 0755 01/11/22 0932 01/11/22 0856 01/10/22 1228   Does patient have an order for bedrest or is patient medically unstable -- No - Continue assessment -- No - Continue assessment --   What is the highest level of mobility based on the progressive mobility assessment? Level 5 (Walks with assist in room/hall) - Balance while stepping forward/back and can walk in room with assist - Complete Level 5 (Walks with assist in room/hall) - Balance while stepping forward/back and can walk in room with assist - Complete Level 5 (Walks with assist in room/hall) - Balance while stepping forward/back and can walk in room with assist - Complete -- Level 5 (Walks with assist in room/hall) - Balance while stepping forward/back and can walk in room with assist - Complete    Row Name 01/09/22 2000           Does  patient have an order for bedrest or is patient medically unstable No - Continue assessment       What is the highest level of mobility based on the progressive mobility assessment? Level 5 (Walks with assist in room/hall) - Balance while stepping forward/back and can walk in room with assist - Complete                Barriers to discharge:  Disposition Plan:  SNF Status is:  Inpt  Objective: Blood pressure (!) 119/57, pulse (!) 106, temperature 98 F (36.7 C), temperature source Oral, resp. rate 18, height '5\' 4"'$  (1.626 m), weight 87.9 kg, SpO2 95 %.  Examination:  Physical Exam Constitutional:      General: She is not in acute distress.    Appearance: She is well-developed.  HENT:     Head: Normocephalic and atraumatic.     Mouth/Throat:     Mouth: Mucous membranes are moist.  Eyes:     Extraocular Movements: Extraocular movements intact.     Pupils: Pupils are equal, round, and reactive to light.  Cardiovascular:     Rate and Rhythm: Normal rate and regular rhythm.  Pulmonary:     Effort: Pulmonary effort is normal.     Breath sounds: Wheezing and rhonchi present.  Abdominal:     General: Bowel sounds are normal. There is no distension.     Palpations: Abdomen is soft.  Musculoskeletal:        General: Normal range of motion.     Cervical back: Normal range of motion and neck supple.  Skin:    General: Skin is warm and dry.  Neurological:     General: No focal deficit present.     Mental Status: She is alert.  Psychiatric:        Mood and Affect: Mood normal.      Consultants:    Procedures:    Data Reviewed: Results for orders placed or performed during the hospital encounter of 01/01/22 (from the past 24 hour(s))  Glucose, capillary     Status: Abnormal   Collection Time: 01/11/22  7:50 PM  Result Value Ref Range   Glucose-Capillary 225 (H) 70 - 99 mg/dL  Glucose, capillary     Status: Abnormal   Collection Time: 01/12/22 12:02 AM  Result Value Ref Range   Glucose-Capillary 123 (H) 70 - 99 mg/dL  Glucose, capillary     Status: Abnormal   Collection Time: 01/12/22  6:16 AM  Result Value Ref Range   Glucose-Capillary 111 (H) 70 - 99 mg/dL  Glucose, capillary     Status: None   Collection Time: 01/12/22  7:29 AM  Result Value Ref Range   Glucose-Capillary 85 70 - 99 mg/dL  Glucose, capillary     Status: Abnormal   Collection  Time: 01/12/22 11:30 AM  Result Value Ref Range   Glucose-Capillary 167 (H) 70 - 99 mg/dL  Glucose, capillary     Status: Abnormal   Collection Time: 01/12/22  4:40 PM  Result Value Ref Range   Glucose-Capillary 134 (H) 70 - 99 mg/dL    I have Reviewed nursing notes, Vitals, and Lab results since pt's last encounter. Pertinent lab results : see above I have ordered test including BMP, CBC, Mg I have reviewed the last note from staff over past 24 hours I have discussed pt's care plan and test results with nursing staff, case manager   LOS: 11 days   Dwyane Dee, MD [facility]  01/12/2022, 5:41 PM

## 2022-01-12 NOTE — Progress Notes (Signed)
Patient declines the use of nocturnal BiPAP tonight. RT will continue to follow.

## 2022-01-12 NOTE — Hospital Course (Addendum)
Crystal Duarte is a 61 yo female with PMH ongoing tobacco use, COPD, MDD, colonic polyps who presented with shortness of breath. She was diagnosed with COVID on 11/12/2021.  She has had difficulty recovering although has continued to smoke. Repeat testing on 01/01/2022 was again positive for COVID.  She was started on treatment which she completed during hospitalization.  RVP also obtained during admission which was positive for rhinovirus.  Pulmonology was also consulted. She was also hypoxic during work-up requiring 2 L nasal cannula.  This was slowly weaned as able.  She was able to be weaned to room air and ambulated with no desaturations.

## 2022-01-13 DIAGNOSIS — J1282 Pneumonia due to coronavirus disease 2019: Secondary | ICD-10-CM | POA: Diagnosis not present

## 2022-01-13 DIAGNOSIS — J206 Acute bronchitis due to rhinovirus: Secondary | ICD-10-CM | POA: Diagnosis not present

## 2022-01-13 DIAGNOSIS — J9601 Acute respiratory failure with hypoxia: Secondary | ICD-10-CM | POA: Diagnosis not present

## 2022-01-13 DIAGNOSIS — U071 COVID-19: Secondary | ICD-10-CM | POA: Diagnosis not present

## 2022-01-13 LAB — GLUCOSE, CAPILLARY
Glucose-Capillary: 120 mg/dL — ABNORMAL HIGH (ref 70–99)
Glucose-Capillary: 125 mg/dL — ABNORMAL HIGH (ref 70–99)
Glucose-Capillary: 225 mg/dL — ABNORMAL HIGH (ref 70–99)
Glucose-Capillary: 94 mg/dL (ref 70–99)

## 2022-01-13 MED ORDER — PREDNISONE 5 MG PO TABS
30.0000 mg | ORAL_TABLET | Freq: Every day | ORAL | Status: DC
  Administered 2022-01-14 – 2022-01-15 (×2): 30 mg via ORAL
  Filled 2022-01-13 (×2): qty 2

## 2022-01-13 NOTE — Progress Notes (Signed)
Progress Note    Crystal Duarte   ELF:[id number]  DOB: Dec 08, 1960  DOA: 01/01/2022     12 PCP: [name], MD  Initial CC: cough, SOB  Hospital Course: Ms. Crystal Duarte is a 61 yo female with PMH ongoing tobacco use, COPD, MDD, colonic polyps who presented with shortness of breath. She was diagnosed with COVID on 11/12/2021.  She has had difficulty recovering although has continued to smoke. Repeat testing on 01/01/2022 was again positive for COVID.  She was started on treatment which she completed during hospitalization.  RVP also obtained during admission which was positive for rhinovirus.  Pulmonology was also consulted. She was also hypoxic during work-up requiring 2 L nasal cannula.  This was slowly weaned as able.  Interval History:  No events overnight. I spoke with Crystal Duarte this afternoon for P2P and patient was still denied from rehab.  Patient still complaining of some cough/congestion and having difficulty coughing up still.   Assessment and Plan:  Acute hypoxic respiratory failure COVID-19 infection Acute bronchitis due to rhinovirus Acute COPD exacerbation -CTA chest negative for PE - s/p IV steroids; now on oral taper - s/p 5 days paxlovid -Wean oxygen as able and will need walk test off of oxygen -Continue Brovana, budesonide, Flonase, DuoNebs - Continue prednisone - Continue scheduled Mucinex  -Out of bed as able    Physical deconditioning -Patient having difficulty recovering from recurrent infections - PT following.  Recommendation is for SNF per PT; declined by [facility]; s/p P2P on 10/12, also declined   Generalized anxiety disorder - Continue with BuSpar and Ativan - propranolol on hold in setting of wheezing     Chest pain Appears to have resolved . Echocardiogram showed Left ventricular ejection fraction, by estimation, is 70 to 75%. The left ventricle has hyperdynamic function. The left ventricle has no regional wall motion abnormalities. Left  ventricular diastolic parameters are consistent with Grade I diastolic dysfunction (impaired relaxation).   Essential hypertension Blood pressure parameters slightly elevated , added hydralazine 25 mg TID.  Added norvasc 5 mg daily.   Chronic diastolic heart failure Continue to monitor. CTA neg for effusion.  Does not appear to be decompensated.    Obesity - Complicates overall prognosis and care - Body mass index is 33.26 kg/m.   Hypokalemia:  Replaced.    Nasal congestion Afrin spray.    Leukocytosis - due to steroids.    Old records reviewed in assessment of this patient  Antimicrobials:   DVT prophylaxis:  enoxaparin (LOVENOX) injection 40 mg Start: 01/08/22 1000   Code Status:   Code Status: Partial Code  Mobility Assessment (last 72 hours)     Mobility Assessment     Row Name 01/13/22 0820 01/12/22 2100 01/12/22 0903 01/12/22 0755 01/11/22 0932   Does patient have an order for bedrest or is patient medically unstable No - Continue assessment No - Continue assessment -- No - Continue assessment --   What is the highest level of mobility based on the progressive mobility assessment? Level 5 (Walks with assist in room/hall) - Balance while stepping forward/back and can walk in room with assist - Complete Level 5 (Walks with assist in room/hall) - Balance while stepping forward/back and can walk in room with assist - Complete Level 5 (Walks with assist in room/hall) - Balance while stepping forward/back and can walk in room with assist - Complete Level 5 (Walks with assist in room/hall) - Balance while stepping forward/back and can walk in room with  assist - Complete Level 5 (Walks with assist in room/hall) - Balance while stepping forward/back and can walk in room with assist - Complete    Row Name 01/11/22 0856           Does patient have an order for bedrest or is patient medically unstable No - Continue assessment                Barriers to discharge:   Disposition Plan:  SNF Status is: Inpt  Objective: Blood pressure 132/69, pulse (!) 109, temperature 98.1 F (36.7 C), temperature source Oral, resp. rate 20, height '5\' 4"'$  (1.626 m), weight 87.9 kg, SpO2 96 %.  Examination:  Physical Exam Constitutional:      General: She is not in acute distress.    Appearance: She is well-developed.  HENT:     Head: Normocephalic and atraumatic.     Mouth/Throat:     Mouth: Mucous membranes are moist.  Eyes:     Extraocular Movements: Extraocular movements intact.     Pupils: Pupils are equal, round, and reactive to light.  Cardiovascular:     Rate and Rhythm: Normal rate and regular rhythm.  Pulmonary:     Effort: Pulmonary effort is normal.     Breath sounds: Wheezing and rhonchi present.  Abdominal:     General: Bowel sounds are normal. There is no distension.     Palpations: Abdomen is soft.  Musculoskeletal:        General: Normal range of motion.     Cervical back: Normal range of motion and neck supple.  Skin:    General: Skin is warm and dry.  Neurological:     General: No focal deficit present.     Mental Status: She is alert.  Psychiatric:        Mood and Affect: Mood normal.      Consultants:    Procedures:    Data Reviewed: Results for orders placed or performed during the hospital encounter of 01/01/22 (from the past 24 hour(s))  Glucose, capillary     Status: Abnormal   Collection Time: 01/12/22  4:40 PM  Result Value Ref Range   Glucose-Capillary 134 (H) 70 - 99 mg/dL  Glucose, capillary     Status: Abnormal   Collection Time: 01/12/22  7:50 PM  Result Value Ref Range   Glucose-Capillary 123 (H) 70 - 99 mg/dL  Glucose, capillary     Status: Abnormal   Collection Time: 01/13/22 12:44 AM  Result Value Ref Range   Glucose-Capillary 125 (H) 70 - 99 mg/dL  Glucose, capillary     Status: None   Collection Time: 01/13/22  7:33 AM  Result Value Ref Range   Glucose-Capillary 94 70 - 99 mg/dL  Glucose, capillary      Status: Abnormal   Collection Time: 01/13/22 11:39 AM  Result Value Ref Range   Glucose-Capillary 120 (H) 70 - 99 mg/dL  Glucose, capillary     Status: Abnormal   Collection Time: 01/13/22  3:31 PM  Result Value Ref Range   Glucose-Capillary 225 (H) 70 - 99 mg/dL    I have Reviewed nursing notes, Vitals, and Lab results since pt's last encounter. Pertinent lab results : see above I have reviewed the last note from staff over past 24 hours I have discussed pt's care plan and test results with nursing staff, case manager   LOS: 12 days   Dwyane Dee, MD [facility] 01/13/2022, 4:30 PM

## 2022-01-13 NOTE — TOC Progression Note (Addendum)
Transition of Care [facility]) - Progression Note    Patient Details  Name: Crystal Duarte MRN: [id number] Date of Birth: 1961-03-04  Transition of Care [facility]) CM/SW Contact  Henrietta Dine, RN Phone Number: 01/13/2022, 9:12 AM  Clinical Narrative:    Notified by Elray Buba, pt denied insurance auth; Optional Peer to Peer can be scheduled by calling [phone] opt 3 with a deadline of 12 noon on 01/13/22 schedule, expedited appeal number is [id number] ( once case is closed);  notified MD; pt's dtr Shireen Quan [phone]) also notified; will proceed based on pt/family decision.  -0911 - notified Dr Sabino Gasser via secure chat of denial; awaiting response.  -4270 - notified Dr Sabino Gasser via secure chat of denial; awaiting response.  - 1046 - paged Dr Sabino Gasser; awaiting call back.  - 1048 - Dr Sabino Gasser notified of peer to peer and cut off time; he says he will call the provider and provide update.  - 1330 - notified by Dr Sabino Gasser that peer tp peer completed; however pt was still denied.  - [phone] - pt's dtr Shireen Quan called and given result of peer to peer meeting; she verbalized understanding and would like to appeal the denial; she was given the number for Medicare [facility] Appeal [phone]); Ms Sheppard Coil is concerned that pt will be d/c'd; explained that no d/c orders have been written at this point; pt's dtr says she will call as soon as we hang up; will notify Dr Sabino Gasser; awaiting outcome; TOC will con't to follow.   - 6073 - called [employer], and spoke with Rep. Lea B.; explained dtr's desire to appeal aoth denial; she gave this CM the phone # for expedited appeal [phone], option 3); she also provided call ref #[id number] and fax [phone] for documents; rep also encouraged the family speak to an agent to assist with the claim.  -1421- called pt's dtr and explained that this CM gave her the [facility] appeal # in error because the pt does not have d/c  orders; explained to her that the appeal that will with [employer] for an expedited appeal; # for [employer] given to pt's dtr; assisted by [employer] for pt's dtr; spoke with Rep. Jade in the pre-cert dept; she provided denied auth # [id number] then transferred the call to member services to [place] [phone]).  - 1506 - called by pt's dtr for assistance because she does not have a fax machine; per Cyndie Mull, documents for appeal will be faxed to this office [phone]); once completed, these documents need to be faxed back to [facility] [phone]); awaiting fax from [place] - pt's dtr called back for assistance; connected with Rep from [employer]; the rep says she is not seeing a denial letter for the pt; explained the pt is still in the hospital and her dtr is seeking to start the expedited appeal process for her mother; TOC to follow.  - 1550 - spoke with [place] Rep Kennyth Lose at [phone]; gave her denied auth # [id number]; further explained the the pt's dtr is attempting to initiated an expedited appeal and the pt is is still hospitalized; Kennyth Lose says the only note she sees related to the denial is "medical necessity"; transferred to [facility] in [employer]; she provided the number to member services, [phone].  - 1617 - spoke with Olivia Mackie, Resolution Specialist at [facility]; she says the appeals team will fax the CMS [phone] form, to this office, and all previous notes did not meet the criteria; new notes that  show a change in the pt status should be faxed to [phone]; she also gave call ref # [id number] and member services # [phone]; Olivia Mackie also says the fax should arrive in the next 24 hours and the appeal cannot be started until the CMS 1696 form is received; awaiting fax; will contact pt's dtr [place] upon arrival.     Expected Discharge Plan and Services                                                 Social Determinants of Health (SDOH)  Interventions    Readmission Risk Interventions    01/07/2022    9:38 AM  Readmission Risk Prevention Plan  Post Dischage Appt Complete  Medication Screening Complete  Transportation Screening Complete

## 2022-01-14 ENCOUNTER — Inpatient Hospital Stay (HOSPITAL_COMMUNITY): Payer: Medicare HMO

## 2022-01-14 DIAGNOSIS — M7989 Other specified soft tissue disorders: Secondary | ICD-10-CM

## 2022-01-14 DIAGNOSIS — J9601 Acute respiratory failure with hypoxia: Secondary | ICD-10-CM | POA: Diagnosis not present

## 2022-01-14 DIAGNOSIS — J1282 Pneumonia due to coronavirus disease 2019: Secondary | ICD-10-CM | POA: Diagnosis not present

## 2022-01-14 DIAGNOSIS — U071 COVID-19: Secondary | ICD-10-CM | POA: Diagnosis not present

## 2022-01-14 DIAGNOSIS — J206 Acute bronchitis due to rhinovirus: Secondary | ICD-10-CM | POA: Diagnosis not present

## 2022-01-14 LAB — GLUCOSE, CAPILLARY
Glucose-Capillary: 104 mg/dL — ABNORMAL HIGH (ref 70–99)
Glucose-Capillary: 124 mg/dL — ABNORMAL HIGH (ref 70–99)
Glucose-Capillary: 141 mg/dL — ABNORMAL HIGH (ref 70–99)
Glucose-Capillary: 163 mg/dL — ABNORMAL HIGH (ref 70–99)
Glucose-Capillary: 85 mg/dL (ref 70–99)

## 2022-01-14 MED ORDER — SENNOSIDES-DOCUSATE SODIUM 8.6-50 MG PO TABS
1.0000 | ORAL_TABLET | Freq: Two times a day (BID) | ORAL | Status: DC
  Administered 2022-01-14 – 2022-01-15 (×3): 1 via ORAL
  Filled 2022-01-14 (×3): qty 1

## 2022-01-14 MED ORDER — MAGNESIUM CITRATE PO SOLN
1.0000 | Freq: Once | ORAL | Status: DC
  Filled 2022-01-14: qty 296

## 2022-01-14 MED ORDER — LACTULOSE 10 GM/15ML PO SOLN
30.0000 g | Freq: Once | ORAL | Status: AC
  Administered 2022-01-14: 30 g via ORAL
  Filled 2022-01-14: qty 45

## 2022-01-14 MED ORDER — PREDNISONE 10 MG PO TABS: ORAL_TABLET | ORAL | 0 refills | Status: DC

## 2022-01-14 MED ORDER — AMLODIPINE BESYLATE 5 MG PO TABS: 5.0000 mg | ORAL_TABLET | Freq: Every day | ORAL | 3 refills | Status: DC

## 2022-01-14 MED ORDER — PROPRANOLOL HCL 10 MG PO TABS
10.0000 mg | ORAL_TABLET | Freq: Three times a day (TID) | ORAL | Status: DC
  Administered 2022-01-14 – 2022-01-15 (×3): 10 mg via ORAL
  Filled 2022-01-14 (×3): qty 1

## 2022-01-14 NOTE — Progress Notes (Signed)
SATURATION QUALIFICATIONS: (This note is used to comply with regulatory documentation for home oxygen)  Patient Saturations on Room Air at Rest = 95%  Patient Saturations on Room Air while Ambulating = 100%  Please briefly explain why patient needs home oxygen: Patient does not qualify for home use oxygen.  Angie Fava, RN

## 2022-01-14 NOTE — Progress Notes (Signed)
Progress Note    Crystal Duarte   VQQ:[id number]  DOB: [birth year]  DOA: 01/01/2022     13 PCP: Crystal Koch, MD  Initial CC: cough, SOB  Hospital Course: Crystal Duarte is a 61 yo female with PMH ongoing tobacco use, COPD, MDD, colonic polyps who presented with shortness of breath. She was diagnosed with COVID on 11/12/2021.  She has had difficulty recovering although has continued to smoke. Repeat testing on 01/01/2022 was again positive for COVID.  She was started on treatment which she completed during hospitalization.  RVP also obtained during admission which was positive for rhinovirus.  Pulmonology was also consulted. She was also hypoxic during work-up requiring 2 L nasal cannula.  This was slowly weaned as able.  She was able to be weaned to room air and ambulated with no desaturations.  Interval History:  No events overnight. I spoke with Crystal Duarte this morning on phone to give an update as well. Patient ambulating around the room when seen this morning.  Breathing comfortable and remains on room air.  Assessment and Plan:  Acute hypoxic respiratory failure COVID-19 infection Acute bronchitis due to rhinovirus Acute COPD exacerbation -CTA chest negative for PE - s/p IV steroids; now on oral taper - s/p 5 days paxlovid -Successfully weaned off of oxygen.  Walk test performed 01/14/2022 with no desaturations. -Continue Brovana, budesonide, Flonase, DuoNebs - Continue prednisone - Continue scheduled Mucinex  -Out of bed as able    Physical deconditioning -Patient having difficulty recovering from recurrent infections - PT following.  Recommendation is for SNF per PT; declined by [facility]; s/p P2P on 10/12, also declined  -Patient planning for returning home with home health  Generalized anxiety disorder - Continue with BuSpar and Ativan -Okay to resume propranolol   Chest pain Appears to have resolved . Echocardiogram showed Left ventricular ejection  fraction, by estimation, is 70 to 75%. The left ventricle has hyperdynamic function. The left ventricle has no regional wall motion abnormalities. Left ventricular diastolic parameters are consistent with Grade I diastolic dysfunction (impaired relaxation).   Essential hypertension -Continue amlodipine - Resume propranolol - Trial of discontinuing hydralazine  Chronic diastolic heart failure Continue to monitor. CTA neg for effusion.  Does not appear to be decompensated.    Obesity - Complicates overall prognosis and care - Body mass index is 34.28 kg/m.   Hypokalemia:  Replaced.    Nasal congestion Afrin spray.    Leukocytosis - due to steroids.    Old records reviewed in assessment of this patient  Antimicrobials:   DVT prophylaxis:  enoxaparin (LOVENOX) injection 40 mg Start: 01/08/22 1000   Code Status:   Code Status: Partial Code  Mobility Assessment (last 72 hours)     Mobility Assessment     Row Name 01/13/22 0820 01/12/22 2100 01/12/22 0903 01/12/22 0755     Does patient have an order for bedrest or is patient medically unstable No - Continue assessment No - Continue assessment -- No - Continue assessment    What is the highest level of mobility based on the progressive mobility assessment? Level 5 (Walks with assist in room/hall) - Balance while stepping forward/back and can walk in room with assist - Complete Level 5 (Walks with assist in room/hall) - Balance while stepping forward/back and can walk in room with assist - Complete Level 5 (Walks with assist in room/hall) - Balance while stepping forward/back and can walk in room with assist - Complete Level 5 (Walks with assist  in room/hall) - Balance while stepping forward/back and can walk in room with assist - Complete             Barriers to discharge:  Disposition Plan:  SNF Status is: Inpt  Objective: Blood pressure 137/78, pulse 92, temperature 97.9 F (36.6 C), resp. rate 19, height '5\' 4"'$   (1.626 m), weight 90.6 kg, SpO2 94 %.  Examination:  Physical Exam Constitutional:      General: She is not in acute distress.    Appearance: She is well-developed.  HENT:     Head: Normocephalic and atraumatic.     Mouth/Throat:     Mouth: Mucous membranes are moist.  Eyes:     Extraocular Movements: Extraocular movements intact.     Pupils: Pupils are equal, round, and reactive to light.  Cardiovascular:     Rate and Rhythm: Normal rate and regular rhythm.  Pulmonary:     Effort: Pulmonary effort is normal.     Breath sounds: Wheezing and rhonchi present.  Abdominal:     General: Bowel sounds are normal. There is no distension.     Palpations: Abdomen is soft.  Musculoskeletal:        General: Normal range of motion.     Cervical back: Normal range of motion and neck supple.  Skin:    General: Skin is warm and dry.  Neurological:     General: No focal deficit present.     Mental Status: She is alert.  Psychiatric:        Mood and Affect: Mood normal.      Consultants:    Procedures:    Data Reviewed: Results for orders placed or performed during the hospital encounter of 01/01/22 (from the past 24 hour(s))  Glucose, capillary     Status: Abnormal   Collection Time: 01/13/22  3:31 PM  Result Value Ref Range   Glucose-Capillary 225 (H) 70 - 99 mg/dL  Glucose, capillary     Status: Abnormal   Collection Time: 01/14/22 12:02 AM  Result Value Ref Range   Glucose-Capillary 124 (H) 70 - 99 mg/dL  Glucose, capillary     Status: None   Collection Time: 01/14/22  7:25 AM  Result Value Ref Range   Glucose-Capillary 85 70 - 99 mg/dL  Glucose, capillary     Status: Abnormal   Collection Time: 01/14/22 11:34 AM  Result Value Ref Range   Glucose-Capillary 104 (H) 70 - 99 mg/dL    I have Reviewed nursing notes, Vitals, and Lab results since pt's last encounter. Pertinent lab results : see above I have reviewed the last note from staff over past 24 hours I have discussed  pt's care plan and test results with nursing staff, case manager   LOS: 13 days   Dwyane Dee, MD [facility] 01/14/2022, 3:12 PM

## 2022-01-14 NOTE — TOC Progression Note (Addendum)
Transition of Care [facility]) - Progression Note    Patient Details  Name: Crystal Duarte MRN: [id number] Date of Birth: May 03, 1960  Transition of [facility]) CM/SW Contact  Henrietta Dine, RN Phone Number: 01/14/2022, 11:09 AM  Clinical Narrative:    Have not received expedited appeal paperwork from [facility]; pt now has d/c orders; notified by Dr Sabino Gasser that he had spoken with pt's dtr, [place]; called [place] [phone]) and notified her of d/c orders, and orders for walk test; also discussed options of if expedited appeal denied: pt going home with HHPT, private duty sitter at their expense, or SNF at their expense; [place] says she will consider taking the pt home with [facility]; [place] also says she can not take the pt pick the pt up today because she has small children and must make arrangements for them;  awaiting decision.  - 1127- notified by Yong Channel, RN, that pt has order for shower chair; contacted Danielle at [facility]; she says  they can provide this DME for the pt and it will be delivered to the room.  - 8676 - called pt's dtr for decision; she says that she will have to talk to her sister and her mother.  - 1150- pt's dtr called back and says she talked with her sister, and the aide in the home with her grandmother can not take care of the two; she can not take the pt home today because arrangements have to be made for tomorrow; she clarifies that she can take the pt home with PT on tomorrow, but she will pursue Judithann Graves appeal if pt must leave the hospital today; will discuss with supervisor and MD for recc.   -1202- notified by Andee Poles at [facility], shower chair delivered to room.  -1236- d/c order changed to 10/14; pt to be d/c'd with HHPT; she does not have an agency preference; dtr she will not pursue appeal with [facility] F[facility] or Judithann Graves; spoke with Stanton Kidney at [facility] notified her that no appeal will be received; [place] also notified via fax, confirmation received also [place] spoke with  Christean Grief; also verified address pt will be going to [place], [place] [address], [place] [phone]; explained this information will be provided in the d/c instructions; she verbalized understanding.  -1247- spoke with Marjory Lies at [facility], and they can provide HHPT; given address and contact information; he will contact the pt's dtr regarding start of care date.  -[address] from [place] called and verified referral was received  -1522- pt's dtr called back and says she has SNF auth from [facility]; no documents have been received; will attempt to contact [facility]  to see if they have received documents, and call dtr back.  1614- attempted to contact [facility]; LVM for Al[facility] Harm [phone]; informed dtr unable to reach facility; explained to her dtr that this CM has no way of verifying auth; advised her some facilities do not accept new pts on weekends and encouraged her to contact the facility for direction; gave her this office's fax # in case documents are to be sent here; she verbalized understanding.        Expected Discharge Plan and Services           Expected Discharge Date: 01/14/22                                     Social Determinants of Health (SDOH) Interventions  Readmission Risk Interventions    01/07/2022    9:38 AM  Readmission Risk Prevention Plan  Post Dischage Appt Complete  Medication Screening Complete  Transportation Screening Complete

## 2022-01-14 NOTE — Progress Notes (Signed)
Left lower extremity venous duplex has been completed. Preliminary results can be found in CV Proc through chart review.  Results were given to the patient's nurse, [place].  01/14/22 11:02 AM Crystal Duarte RVT

## 2022-01-15 DIAGNOSIS — J9601 Acute respiratory failure with hypoxia: Secondary | ICD-10-CM | POA: Diagnosis not present

## 2022-01-15 DIAGNOSIS — I5031 Acute diastolic (congestive) heart failure: Secondary | ICD-10-CM | POA: Diagnosis not present

## 2022-01-15 DIAGNOSIS — J206 Acute bronchitis due to rhinovirus: Secondary | ICD-10-CM | POA: Diagnosis not present

## 2022-01-15 DIAGNOSIS — U071 COVID-19: Secondary | ICD-10-CM | POA: Diagnosis not present

## 2022-01-15 DIAGNOSIS — Z23 Encounter for immunization: Secondary | ICD-10-CM | POA: Diagnosis not present

## 2022-01-15 LAB — GLUCOSE, CAPILLARY
Glucose-Capillary: 87 mg/dL (ref 70–99)
Glucose-Capillary: 117 mg/dL — ABNORMAL HIGH (ref 70–99)

## 2022-01-15 MED ORDER — IPRATROPIUM-ALBUTEROL 0.5-2.5 (3) MG/3ML IN SOLN
3.0000 mL | Freq: Three times a day (TID) | RESPIRATORY_TRACT | Status: DC
  Administered 2022-01-15: 3 mL via RESPIRATORY_TRACT
  Filled 2022-01-15: qty 3

## 2022-01-15 NOTE — TOC Transition Note (Signed)
Transition of Care [facility]) - CM/SW Discharge Note   Patient Details  Name: Crystal Duarte MRN: [id number] Date of Birth: 03-09-1961  Transition of Care [facility]) CM/SW Contact:  Henrietta Dine, RN Phone Number: 01/15/2022, 9:24 AM   Clinical Narrative:   Pt to d/c home with HHPT; her daughter will provide transportation; no TOC needs.    Final next level of care: [place] Barriers to Discharge: No Barriers Identified   Patient Goals and CMS Choice        Discharge Placement                       Discharge Plan and Services   Discharge Planning Services: CM Consult            DME Arranged: Shower stool DME Agency: [employer] Date DME Agency Contacted: 01/14/22 Time DME Agency Contacted: 0127 Representative spoke with at DME Agency: Andee Poles HH Arranged: PT [place]: [place] Date [place]: 01/14/22 Time [place]: 72 Representative spoke with at [place]: Aaron/Kelly  Social Determinants of Health ([place]) Interventions     Readmission Risk Interventions    01/07/2022    9:38 AM  Readmission Risk Prevention Plan  Post Dischage Appt Complete  Medication Screening Complete  Transportation Screening Complete

## 2022-01-15 NOTE — Plan of Care (Signed)
  Problem: Coping: Goal: Psychosocial and spiritual needs will be supported Outcome: Adequate for Discharge   Problem: Respiratory: Goal: Will maintain a patent airway Outcome: Adequate for Discharge Goal: Complications related to the disease process, condition or treatment will be avoided or minimized Outcome: Adequate for Discharge   Problem: Health Behavior/Discharge Planning: Goal: Ability to manage health-related needs will improve Outcome: Adequate for Discharge   Problem: Clinical Measurements: Goal: Ability to maintain clinical measurements within normal limits will improve Outcome: Adequate for Discharge Goal: Will remain free from infection Outcome: Adequate for Discharge Goal: Diagnostic test results will improve Outcome: Adequate for Discharge Goal: Respiratory complications will improve Outcome: Adequate for Discharge Goal: Cardiovascular complication will be avoided Outcome: Adequate for Discharge   Problem: Activity: Goal: Risk for activity intolerance will decrease Outcome: Adequate for Discharge   Problem: Nutrition: Goal: Adequate nutrition will be maintained Outcome: Adequate for Discharge   Problem: Coping: Goal: Level of anxiety will decrease Outcome: Adequate for Discharge   Problem: Elimination: Goal: Will not experience complications related to bowel motility Outcome: Adequate for Discharge Goal: Will not experience complications related to urinary retention Outcome: Adequate for Discharge   Problem: Pain Managment: Goal: General experience of comfort will improve Outcome: Adequate for Discharge   Problem: Safety: Goal: Ability to remain free from injury will improve Outcome: Adequate for Discharge   Problem: Skin Integrity: Goal: Risk for impaired skin integrity will decrease Outcome: Adequate for Discharge   Problem: Education: Goal: Ability to describe self-care measures that may prevent or decrease complications (Diabetes Survival  Skills Education) will improve Outcome: Adequate for Discharge Goal: Individualized Educational Video(s) Outcome: Adequate for Discharge   Problem: Coping: Goal: Ability to adjust to condition or change in health will improve Outcome: Adequate for Discharge   Problem: Fluid Volume: Goal: Ability to maintain a balanced intake and output will improve Outcome: Adequate for Discharge   Problem: Health Behavior/Discharge Planning: Goal: Ability to identify and utilize available resources and services will improve Outcome: Adequate for Discharge Goal: Ability to manage health-related needs will improve Outcome: Adequate for Discharge   Problem: Metabolic: Goal: Ability to maintain appropriate glucose levels will improve Outcome: Adequate for Discharge   Problem: Nutritional: Goal: Maintenance of adequate nutrition will improve Outcome: Adequate for Discharge Goal: Progress toward achieving an optimal weight will improve Outcome: Adequate for Discharge   Problem: Skin Integrity: Goal: Risk for impaired skin integrity will decrease Outcome: Adequate for Discharge   Problem: Tissue Perfusion: Goal: Adequacy of tissue perfusion will improve Outcome: Adequate for Discharge

## 2022-01-15 NOTE — Progress Notes (Signed)
Discharge instructions provided to and reviewed with patient, patient verbalized understanding.  PIV removed.  Shower chair and other belongings transported with patient via wheelchair to main entrance for transport home with daughter.  Angie Fava, RN

## 2022-01-15 NOTE — Discharge Summary (Signed)
Physician Discharge Summary   Crystal Duarte OVF:[id number] DOB: [birth year] DOA: 01/01/2022  PCP: [name], MD  Admit date: 01/01/2022 Discharge date: 01/15/2022  Barriers to discharge: family unable to take patient home until 10/14  Admitted From: Home Disposition:  Home with [facility] Discharging physician: Dwyane Dee, MD  Recommendations for Outpatient Follow-up:  Encourage ongoing smoking cessation  Home Health:  Equipment/Devices:   Discharge Condition: stable CODE STATUS: Partial Diet recommendation:  Diet Orders (From admission, onward)     Start     Ordered   01/14/22 0000  Diet - low sodium heart healthy        01/14/22 1040   01/02/22 1101  Diet Heart Room service appropriate? Yes; Fluid consistency: Thin  Diet effective now       Question Answer Comment  Room service appropriate? Yes   Fluid consistency: Thin      01/02/22 1100            Hospital Course: Ms. Crystal Duarte is a 61 yo female with PMH ongoing tobacco use, COPD, MDD, colonic polyps who presented with shortness of breath. She was diagnosed with COVID on 11/12/2021.  She has had difficulty recovering although has continued to smoke. Repeat testing on 01/01/2022 was again positive for COVID.  She was started on treatment which she completed during hospitalization.  RVP also obtained during admission which was positive for rhinovirus.  Pulmonology was also consulted. She was also hypoxic during work-up requiring 2 L nasal cannula.  This was slowly weaned as able.  She was able to be weaned to room air and ambulated with no desaturations.  Assessment and Plan:  Acute hypoxic respiratory failure COVID-19 infection Acute bronchitis due to rhinovirus Acute COPD exacerbation -CTA chest negative for PE - s/p IV steroids; now on oral taper - s/p 5 days paxlovid -Successfully weaned off of oxygen.  Walk test performed 01/14/2022 with no desaturations. - Continue prednisone   Physical  deconditioning -Patient having difficulty recovering from recurrent infections - PT following.  Recommendation is for SNF per PT; declined by [id number]; s/p P2P on 10/12, also declined  -Patient planning for returning home with home health   Generalized anxiety disorder - Continue with BuSpar and Ativan -Okay to resume propranolol   Chest pain Appears to have resolved . Echocardiogram showed Left ventricular ejection fraction, by estimation, is 70 to 75%. The left ventricle has hyperdynamic function. The left ventricle has no regional wall motion abnormalities. Left ventricular diastolic parameters are consistent with Grade I diastolic dysfunction (impaired relaxation).   Essential hypertension -Continue amlodipine - Resume propranolol   Chronic diastolic heart failure Continue to monitor. CTA neg for effusion.  Does not appear to be decompensated.    Obesity - Complicates overall prognosis and care - Body mass index is 34.28 kg/m.   Hypokalemia:  Replaced.    Nasal congestion Afrin spray.    Leukocytosis - due to steroids.    The patient's chronic medical conditions were treated accordingly per the patient's home medication regimen except as noted.  On day of discharge, patient was felt deemed stable for discharge. Patient/family member advised to call PCP or come back to ER if needed.   Principal Diagnosis: Pneumonia due to COVID-19 virus  Discharge Diagnoses: Active Hospital Problems   Diagnosis Date Noted   Pneumonia due to COVID-19 virus 01/01/2022   COPD exacerbation (HCC)    COVID-19    Acute respiratory failure with hypoxia (HCC) 84/16/6063   Acute diastolic CHF (congestive  heart failure) ([place]) 01/03/2022   Obesity (BMI 30-39.9) 01/02/2022   Acute bronchitis due to Rhinovirus 01/02/2022   Shortness of breath 10/15/2021   Chest pain in adult 10/15/2021   PAD (peripheral artery disease) ([place]) 03/02/2020   Generalized anxiety disorder 09/07/2017   Severe  episode of recurrent major depressive disorder, without psychotic features ([place]) 07/18/2017   Affective bipolar disorder ([place]) 03/12/2014    Resolved Hospital Problems  No resolved problems to display.     Discharge Instructions     Diet - low sodium heart healthy   Complete by: As directed    Increase activity slowly   Complete by: As directed       Allergies as of 01/15/2022       Reactions   Other Nausea And Vomiting   novacaine   Procaine Nausea And Vomiting   Latuda [lurasidone Hcl] Anxiety   Sulfa Antibiotics Rash   Only in sunlight         Medication List     TAKE these medications    albuterol 108 (90 Base) MCG/ACT inhaler Commonly known as: VENTOLIN HFA Inhale 2 puffs into the lungs every 4 (four) hours as needed.   amLODipine 5 MG tablet Commonly known as: NORVASC Take 1 tablet (5 mg total) by mouth daily.   busPIRone 10 MG tablet Commonly known as: BUSPAR Take two tablets three times daily for anxiety.   gabapentin 400 MG capsule Commonly known as: NEURONTIN Take 1 capsule (400 mg total) by mouth 2 (two) times daily. What changed: Another medication with the same name was removed. Continue taking this medication, and follow the directions you see here.   ipratropium-albuterol 0.5-2.5 (3) MG/3ML Soln Commonly known as: DUONEB Take 3 mLs by nebulization every 6 (six) hours as needed. What changed: reasons to take this   montelukast 10 MG tablet Commonly known as: SINGULAIR TAKE ONE TABLET BY MOUTH EVERY NIGHT AT BEDTIME   Multi-Vitamins Tabs Take 1 tablet by mouth daily.   omeprazole 20 MG capsule Commonly known as: PRILOSEC TAKE ONE CAPSULE BY MOUTH DAILY   predniSONE 10 MG tablet Commonly known as: DELTASONE Take 3 tablets daily for 3 days then 2 tablets daily for 3 days then 1 tablet daily for 3 days   propranolol 10 MG tablet Commonly known as: INDERAL Take 1 tablet (10 mg total) by mouth 3 (three) times daily.   rosuvastatin 20  MG tablet Commonly known as: CRESTOR Take 1 tablet (20 mg total) by mouth daily.   traZODone 100 MG tablet Commonly known as: DESYREL Take 2.5 tablets (250 mg total) by mouth at bedtime.   triamcinolone ointment 0.5 % Commonly known as: KENALOG Apply 1 Application topically 2 (two) times daily.   venlafaxine XR 37.5 MG 24 hr capsule Commonly known as: EFFEXOR-XR Take 37.5 mg by mouth daily with breakfast. Take along with 150 mg tablet=187.5 mg What changed:  Another medication with the same name was changed. Make sure you understand how and when to take each. Another medication with the same name was removed. Continue taking this medication, and follow the directions you see here.   venlafaxine XR 150 MG 24 hr capsule Commonly known as: EFFEXOR-XR Take 1 capsule (150 mg total) by mouth daily with breakfast. What changed:  additional instructions Another medication with the same name was removed. Continue taking this medication, and follow the directions you see here.   VITAMIN D3 PO Take by mouth.  Durable Medical Equipment  (From admission, onward)           Start     Ordered   01/14/22 1120  For home use only DME Shower stool  Once        01/14/22 1119   01/08/22 0858  For home use only DME Walker rolling  Once       Question Answer Comment  Walker: With 5 Inch Wheels   Patient needs a walker to treat with the following condition COPD (chronic obstructive pulmonary disease) ([place])      01/08/22 0857            Contact information for follow-up providers     [facility] [place] Follow up.   Specialty: Home Health Services Contact information: [address] [place] [address] [phone]              Contact information for after-discharge care     Destination     [facility] .   Service: Skilled Nursing Contact information: [address]  [address] [phone]                    Allergies  Allergen Reactions   Other Nausea And Vomiting    novacaine    Procaine Nausea And Vomiting   Latuda [Lurasidone Hcl] Anxiety   Sulfa Antibiotics Rash    Only in sunlight     Consultations:   Procedures:   Discharge Exam: BP (!) 132/105   Pulse 72   Temp 97.8 F (36.6 C) (Oral)   Resp 17   Ht '5\' 4"'$  (1.626 m)   Wt 89.7 kg   SpO2 94%   BMI 33.94 kg/m  Physical Exam Constitutional:      General: She is not in acute distress.    Appearance: She is well-developed.  HENT:     Head: Normocephalic and atraumatic.     Mouth/Throat:     Mouth: Mucous membranes are moist.  Eyes:     Extraocular Movements: Extraocular movements intact.     Pupils: Pupils are equal, round, and reactive to light.  Cardiovascular:     Rate and Rhythm: Normal rate and regular rhythm.  Pulmonary:     Effort: Pulmonary effort is normal.     Breath sounds: Wheezing and rhonchi present.     Comments: Greatly improved wheezing, air movement, and rhonchi Abdominal:     General: Bowel sounds are normal. There is no distension.     Palpations: Abdomen is soft.  Musculoskeletal:        General: Normal range of motion.     Cervical back: Normal range of motion and neck supple.  Skin:    General: Skin is warm and dry.  Neurological:     General: No focal deficit present.     Mental Status: She is alert.  Psychiatric:        Mood and Affect: Mood normal.      The results of significant diagnostics from this hospitalization (including imaging, microbiology, ancillary and laboratory) are listed below for reference.   Microbiology: No results found for this or any previous visit (from the past 240 hour(s)).   Labs: BNP (last 3 results) No results for input(s): "BNP" in the last 8760 hours. Basic Metabolic Panel: Recent Labs  Lab 01/09/22 0304  NA 140  K 3.9  CL 100  CO2 34*  GLUCOSE 158*  BUN 16  CREATININE 0.88  CALCIUM  8.7*   Liver Function Tests: No results for input(s): "AST", "ALT", "ALKPHOS", "BILITOT", "PROT", "ALBUMIN" in the last 168 hours. No results for input(s): "LIPASE", "AMYLASE" in the last 168 hours. No results for input(s): "AMMONIA" in the last 168 hours. CBC: Recent Labs  Lab 01/09/22 0304 01/11/22 0503  WBC 17.3* 16.8*  NEUTROABS 14.3* 11.9*  HGB 13.0 12.8  HCT 40.3 39.9  MCV 98.3 99.0  PLT 253 203   Cardiac Enzymes: No results for input(s): "CKTOTAL", "CKMB", "CKMBINDEX", "TROPONINI" in the last 168 hours. BNP: Invalid input(s): "POCBNP" CBG: Recent Labs  Lab 01/14/22 1134 01/14/22 1619 01/14/22 2131 01/15/22 0739 01/15/22 1142  GLUCAP 104* 163* 141* 87 117*   D-Dimer No results for input(s): "DDIMER" in the last 72 hours. Hgb A1c No results for input(s): "HGBA1C" in the last 72 hours. Lipid Profile No results for input(s): "CHOL", "HDL", "LDLCALC", "TRIG", "CHOLHDL", "LDLDIRECT" in the last 72 hours. Thyroid function studies No results for input(s): "TSH", "T4TOTAL", "T3FREE", "THYROIDAB" in the last 72 hours.  Invalid input(s): "FREET3" Anemia work up No results for input(s): "VITAMINB12", "FOLATE", "FERRITIN", "TIBC", "IRON", "RETICCTPCT" in the last 72 hours. Urinalysis    Component Value Date/Time   COLORURINE YELLOW 10/06/2021 1536   APPEARANCEUR CLEAR 10/06/2021 1536   APPEARANCEUR Clear 05/20/2013 1633   LABSPEC 1.010 10/06/2021 1536   LABSPEC 1.021 05/20/2013 1633   PHURINE 7.0 10/06/2021 1536   GLUCOSEU NEGATIVE 10/06/2021 1536   GLUCOSEU Negative 05/20/2013 1633   HGBUR TRACE (A) 10/06/2021 1536   BILIRUBINUR Neg 10/08/2018 1424   BILIRUBINUR Negative 05/20/2013 1633   KETONESUR NEGATIVE 10/06/2021 1536   PROTEINUR NEGATIVE 10/06/2021 1536   UROBILINOGEN 0.2 10/08/2018 1424   UROBILINOGEN 0.2 09/23/2014 1014   NITRITE Neg 10/08/2018 1424   NITRITE NEGATIVE 11/14/2017 1844   LEUKOCYTESUR Negative 10/08/2018 1424   LEUKOCYTESUR Negative  05/20/2013 1633   Sepsis Labs Recent Labs  Lab 01/09/22 0304 01/11/22 0503  WBC 17.3* 16.8*   Microbiology No results found for this or any previous visit (from the past 240 hour(s)).  Procedures/Studies: VAS [place] LOWER EXTREMITY VENOUS (DVT)  Result Date: 01/14/2022  Lower Venous DVT Study Patient Name:  KARIYAH BAUGH  Date of Exam:   01/14/2022 Medical Rec #: [id number]            Accession #:    [id number] Date of Birth: 1961-02-09            Patient Gender: F Patient Age:   61 years Exam Location:  [facility] Procedure:      VAS [place] LOWER EXTREMITY VENOUS (DVT) Referring Phys: Dwyane Dee --------------------------------------------------------------------------------  Indications: Swelling.  Risk Factors: None identified. Comparison Study: No prior studies. Performing Technologist: Oliver Hum RVT  Examination Guidelines: A complete evaluation includes B-mode imaging, spectral Doppler, color Doppler, and power Doppler as needed of all accessible portions of each vessel. Bilateral testing is considered an integral part of a complete examination. Limited examinations for reoccurring indications may be performed as noted. The reflux portion of the exam is performed with the patient in reverse Trendelenburg.  +-----+---------------+---------+-----------+----------+--------------+ RIGHTCompressibilityPhasicitySpontaneityPropertiesThrombus Aging +-----+---------------+---------+-----------+----------+--------------+ CFV  Full           Yes      Yes                                 +-----+---------------+---------+-----------+----------+--------------+   +---------+---------------+---------+-----------+----------+--------------+ LEFT  CompressibilityPhasicitySpontaneityPropertiesThrombus Aging +---------+---------------+---------+-----------+----------+--------------+ CFV      Full           Yes      Yes                                  +---------+---------------+---------+-----------+----------+--------------+ SFJ      Full                                                        +---------+---------------+---------+-----------+----------+--------------+ FV Prox  Full                                                        +---------+---------------+---------+-----------+----------+--------------+ FV Mid   Full                                                        +---------+---------------+---------+-----------+----------+--------------+ FV DistalFull                                                        +---------+---------------+---------+-----------+----------+--------------+ PFV      Full                                                        +---------+---------------+---------+-----------+----------+--------------+ POP      Full           Yes      Yes                                 +---------+---------------+---------+-----------+----------+--------------+ PTV      Full                                                        +---------+---------------+---------+-----------+----------+--------------+ PERO     Full                                                        +---------+---------------+---------+-----------+----------+--------------+     Summary: RIGHT: - No evidence of common femoral vein obstruction.  LEFT: - There is no evidence of deep vein thrombosis in the lower extremity.  - No cystic structure found in the popliteal fossa.  *See table(s) above for measurements and observations. Electronically signed by Monica Martinez  MD on 01/14/2022 at 1:39:07 PM.    Final    CT Angio Chest Pulmonary Embolism (PE) W or WO Contrast  Result Date: 01/05/2022 CLINICAL DATA:  High clinical suspicion for pulmonary embolism EXAM: CT ANGIOGRAPHY CHEST WITH CONTRAST TECHNIQUE: Multidetector CT imaging of the chest was performed using the standard protocol during bolus administration of  intravenous contrast. Multiplanar CT image reconstructions and MIPs were obtained to evaluate the vascular anatomy. RADIATION DOSE REDUCTION: This exam was performed according to the departmental dose-optimization program which includes automated exposure control, adjustment of the mA and/or kV according to patient size and/or use of iterative reconstruction technique. CONTRAST:  65m OMNIPAQUE IOHEXOL 350 MG/ML SOLN COMPARISON:  10/15/2021 FINDINGS: Cardiovascular: There is homogeneous enhancement in thoracic aorta. Left vertebral artery is arising from the aortic arch. There are no intraluminal filling defects in pulmonary artery branches. Mediastinum/Nodes: No significant lymphadenopathy seen. Lungs/Pleura: There is no focal pulmonary consolidation. Surgical staples are seen in right upper lung field. No discrete lung nodules are seen. There is no pleural effusion or pneumothorax. Upper Abdomen: There is fatty infiltration in liver. Musculoskeletal: No acute findings are seen. Review of the MIP images confirms the above findings. IMPRESSION: There is no evidence of pulmonary artery embolism. There is no evidence of thoracic aortic dissection. There is no focal pulmonary consolidation. Fatty liver. Electronically Signed   By: PElmer PickerM.D.   On: 01/05/2022 18:14   DG CHEST PORT 1 VIEW  Result Date: 01/04/2022 CLINICAL DATA:  [id number]  COVID pneumonia. EXAM: PORTABLE CHEST 1 VIEW COMPARISON:  Portable chest 01/01/2022 FINDINGS: The heart size and mediastinal contours are within normal limits. Both lungs are clear. The visualized skeletal structures are unremarkable. There are multiple overlying monitor wires. IMPRESSION: No active disease. Electronically Signed   By: KTelford NabM.D.   On: 01/04/2022 07:20   ECHOCARDIOGRAM COMPLETE  Result Date: 01/02/2022    ECHOCARDIOGRAM REPORT   [facility] Medical Rec #:  [id number]          Height:        64.0 in Accession #:    [id number]         Weight:       188.9 lb Date of Birth:  3October 19, 1962          BSA:          1.910 m Patient Age:    89years            BP:           100/52 mmHg Patient Gender: F                   HR:           78 bpm. Exam Location:  Inpatient Procedure: 2D Echo, Cardiac Doppler and Color Doppler Indications:    CHF  History:        Patient has prior history of Echocardiogram examinations, most                 recent 03/30/2020. COPD.  Sonographer:    CJefferey PicaReferring Phys: [id number] 1. Left ventricular ejection fraction, by estimation, is 70 to 75%. The left ventricle has hyperdynamic function. The left ventricle has no regional wall motion abnormalities. Left ventricular diastolic parameters are consistent with Grade I diastolic dysfunction (impaired relaxation).  2. Right ventricular systolic function is normal. The  right ventricular size is normal. Tricuspid regurgitation signal is inadequate for assessing PA pressure.  3. The mitral valve is grossly normal. Trivial mitral valve regurgitation.  4. The aortic valve is tricuspid. Aortic valve regurgitation is not visualized.  5. The inferior vena cava is normal in size with greater than 50% respiratory variability, suggesting right atrial pressure of 3 mmHg. Comparison(s): Prior images reviewed side by side. LVEF vigorous at 70-75%. FINDINGS  Left Ventricle: Left ventricular ejection fraction, by estimation, is 70 to 75%. The left ventricle has hyperdynamic function. The left ventricle has no regional wall motion abnormalities. The left ventricular internal cavity size was normal in size. There is no left ventricular hypertrophy. Left ventricular diastolic parameters are consistent with Grade I diastolic dysfunction (impaired relaxation). Right Ventricle: The right ventricular size is normal. No increase in right ventricular wall thickness. Right ventricular systolic function is normal. Tricuspid  regurgitation signal is inadequate for assessing PA pressure. Left Atrium: Left atrial size was normal in size. Right Atrium: Right atrial size was normal in size. Pericardium: There is no evidence of pericardial effusion. Presence of epicardial fat layer. Mitral Valve: The mitral valve is grossly normal. Trivial mitral valve regurgitation. Tricuspid Valve: The tricuspid valve is grossly normal. Tricuspid valve regurgitation is trivial. Aortic Valve: The aortic valve is tricuspid. There is mild aortic valve annular calcification. Aortic valve regurgitation is not visualized. Aortic valve peak gradient measures 7.6 mmHg. Pulmonic Valve: The pulmonic valve was grossly normal. Pulmonic valve regurgitation is not visualized. Aorta: The aortic root is normal in size and structure. Venous: The inferior vena cava is normal in size with greater than 50% respiratory variability, suggesting right atrial pressure of 3 mmHg. IAS/Shunts: No atrial level shunt detected by color flow Doppler.  LEFT VENTRICLE PLAX 2D LVIDd:         4.15 cm   Diastology LVIDs:         2.10 cm   LV e' medial:    5.18 cm/s LV PW:         0.95 cm   LV E/e' medial:  10.9 LV IVS:        0.95 cm   LV e' lateral:   6.96 cm/s LVOT diam:     1.80 cm   LV E/e' lateral: 8.1 LV SV:         69 LV SV Index:   36 LVOT Area:     2.54 cm  RIGHT VENTRICLE             IVC RV Basal diam:  2.70 cm     IVC diam: 2.10 cm RV S prime:     12.60 cm/s TAPSE (M-mode): 2.2 cm LEFT ATRIUM             Index        RIGHT ATRIUM          Index LA diam:        3.20 cm 1.68 cm/m   RA Area:     9.31 cm LA Vol (A2C):   25.9 ml 13.56 ml/m  RA Volume:   18.70 ml 9.79 ml/m LA Vol (A4C):   34.1 ml 17.86 ml/m LA Biplane Vol: 31.6 ml 16.55 ml/m  AORTIC VALVE                 PULMONIC VALVE AV Area (Vmax): 2.52 cm     PV Vmax:       0.92 m/s AV Vmax:  137.50 cm/s  PV Peak grad:  3.4 mmHg AV Peak Grad:   7.6 mmHg LVOT Vmax:      136.00 cm/s LVOT Vmean:     77.300 cm/s LVOT VTI:        0.271 m  AORTA Ao Root diam: 3.00 cm Ao Asc diam:  3.20 cm MITRAL VALVE MV Area (PHT): 4.52 cm    SHUNTS MV Decel Time: 168 msec    Systemic VTI:  0.27 m MV E velocity: 56.30 cm/s  Systemic Diam: 1.80 cm MV A velocity: 77.70 cm/s MV E/A ratio:  0.72 Rozann Lesches MD Electronically signed by Rozann Lesches MD Signature Date/Time: 01/02/2022/11:42:41 AM    Final    DG Chest 1 View  Result Date: 01/01/2022 CLINICAL DATA:  COVID positive with shortness of breath. EXAM: CHEST  1 VIEW COMPARISON:  January 01, 2022 FINDINGS: The heart size and mediastinal contours are within normal limits. Surgical sutures are seen within the lateral aspect of the upper right lung. Both lungs are otherwise clear. The visualized skeletal structures are unremarkable. IMPRESSION: Postoperative changes involving the upper right lung, without evidence of acute or active cardiopulmonary disease. Electronically Signed   By: Virgina Norfolk M.D.   On: 01/01/2022 17:40   DG Chest 2 View  Result Date: 01/01/2022 CLINICAL DATA:  Shortness of breath. COVID about a month ago. History of COPD. EXAM: CHEST - 2 VIEW COMPARISON:  Chest two views 12/16/2021 and 11/12/2021; CT chest 10/15/2021 FINDINGS: Cardiac silhouette and mediastinal contours are within normal limits. Postsurgical changes with suture are seen within the superolateral right lung, unchanged from prior. Mild bilateral chronic interstitial thickening is unchanged. No new focal airspace opacity. No pleural effusion or pneumothorax. Mild multilevel degenerative disc changes of the midthoracic spine. IMPRESSION: 1. No acute cardiopulmonary process. 2. Postsurgical changes of the right lung, unchanged from prior. Electronically Signed   By: Yvonne Kendall M.D.   On: 01/01/2022 10:59     Time coordinating discharge: Over 3 minutes    Dwyane Dee, MD  [facility] 01/15/2022, 5:12 PM

## 2022-01-17 ENCOUNTER — Ambulatory Visit (HOSPITAL_COMMUNITY): Payer: Medicare HMO | Admitting: Student in an Organized Health Care Education/Training Program

## 2022-01-18 ENCOUNTER — Telehealth: Payer: Self-pay

## 2022-01-18 DIAGNOSIS — R69 Illness, unspecified: Secondary | ICD-10-CM | POA: Diagnosis not present

## 2022-01-18 DIAGNOSIS — I5033 Acute on chronic diastolic (congestive) heart failure: Secondary | ICD-10-CM | POA: Diagnosis not present

## 2022-01-18 DIAGNOSIS — M8580 Other specified disorders of bone density and structure, unspecified site: Secondary | ICD-10-CM | POA: Diagnosis not present

## 2022-01-18 DIAGNOSIS — R918 Other nonspecific abnormal finding of lung field: Secondary | ICD-10-CM | POA: Diagnosis not present

## 2022-01-18 DIAGNOSIS — Z7951 Long term (current) use of inhaled steroids: Secondary | ICD-10-CM | POA: Diagnosis not present

## 2022-01-18 DIAGNOSIS — U071 COVID-19: Secondary | ICD-10-CM | POA: Diagnosis not present

## 2022-01-18 DIAGNOSIS — E781 Pure hyperglyceridemia: Secondary | ICD-10-CM | POA: Diagnosis not present

## 2022-01-18 DIAGNOSIS — J1282 Pneumonia due to coronavirus disease 2019: Secondary | ICD-10-CM | POA: Diagnosis not present

## 2022-01-18 DIAGNOSIS — J309 Allergic rhinitis, unspecified: Secondary | ICD-10-CM | POA: Diagnosis not present

## 2022-01-18 DIAGNOSIS — M5412 Radiculopathy, cervical region: Secondary | ICD-10-CM | POA: Diagnosis not present

## 2022-01-18 DIAGNOSIS — G5621 Lesion of ulnar nerve, right upper limb: Secondary | ICD-10-CM | POA: Diagnosis not present

## 2022-01-18 DIAGNOSIS — I110 Hypertensive heart disease with heart failure: Secondary | ICD-10-CM | POA: Diagnosis not present

## 2022-01-18 DIAGNOSIS — J9601 Acute respiratory failure with hypoxia: Secondary | ICD-10-CM | POA: Diagnosis not present

## 2022-01-18 DIAGNOSIS — Z9181 History of falling: Secondary | ICD-10-CM | POA: Diagnosis not present

## 2022-01-18 DIAGNOSIS — E669 Obesity, unspecified: Secondary | ICD-10-CM | POA: Diagnosis not present

## 2022-01-18 DIAGNOSIS — I739 Peripheral vascular disease, unspecified: Secondary | ICD-10-CM | POA: Diagnosis not present

## 2022-01-18 DIAGNOSIS — Z6830 Body mass index (BMI) 30.0-30.9, adult: Secondary | ICD-10-CM | POA: Diagnosis not present

## 2022-01-18 DIAGNOSIS — J441 Chronic obstructive pulmonary disease with (acute) exacerbation: Secondary | ICD-10-CM | POA: Diagnosis not present

## 2022-01-18 DIAGNOSIS — E559 Vitamin D deficiency, unspecified: Secondary | ICD-10-CM | POA: Diagnosis not present

## 2022-01-18 DIAGNOSIS — J206 Acute bronchitis due to rhinovirus: Secondary | ICD-10-CM | POA: Diagnosis not present

## 2022-01-18 DIAGNOSIS — J1289 Other viral pneumonia: Secondary | ICD-10-CM | POA: Diagnosis not present

## 2022-01-18 DIAGNOSIS — I700 Atherosclerosis of aorta: Secondary | ICD-10-CM | POA: Diagnosis not present

## 2022-01-18 DIAGNOSIS — F17210 Nicotine dependence, cigarettes, uncomplicated: Secondary | ICD-10-CM | POA: Diagnosis not present

## 2022-01-18 DIAGNOSIS — M19132 Post-traumatic osteoarthritis, left wrist: Secondary | ICD-10-CM | POA: Diagnosis not present

## 2022-01-18 NOTE — Telephone Encounter (Signed)
I have not seen since 2022 so cannot give verbals without hospital follow up. I also do not know her current meds so would follow hospital advice until follow up visit.

## 2022-01-18 NOTE — Telephone Encounter (Signed)
Please advise 

## 2022-01-18 NOTE — Telephone Encounter (Signed)
Home Health verbal orders  Agency Name: [facility]  Requesting PT  Frequency: 2W3 3T7  Comments: Would like patient to have an OT evaluation. Amlodipine was prescribed at the hospital but is also taking propranolol for anxiety but its a blood pressure pill, wants to confirm its okay to take both. Gabapentin dosage has been decreased and changed to 2 pills twice daily but patient would like to go back to old dose if possible.   Please forward to [facility] pool or providers CMA.

## 2022-01-19 ENCOUNTER — Telehealth (HOSPITAL_COMMUNITY): Payer: Self-pay | Admitting: Psychiatry

## 2022-01-19 ENCOUNTER — Telehealth: Payer: Self-pay

## 2022-01-19 NOTE — Patient Outreach (Signed)
  Care Coordination TOC Note Transition Care Management Follow-up Telephone Call Date of discharge and from where: 01/15/22-[facility] Dx: COVID,COPD How have you been since you were released from the hospital? Patient voices that she has been doing fairly well. She still has some coughing at times but not able to cough it up. States she is only using nebs about 1-2x/day. Encouraged pt to use neb txs more frequently to help loosen secretions.  Any questions or concerns? No  Items Reviewed: Did the pt receive and understand the discharge instructions provided? Yes  Medications obtained and verified? Yes  Other? Yes -resp sxs and mgmt Any new allergies since your discharge? No  Dietary orders reviewed? Yes Do you have support at home? Yes   Home Care and Equipment/Supplies: Were home health services ordered? yes If so, what is the name of the agency? [facility]  Has the agency set up a time to come to the patient's home? yes Were any new equipment or medical supplies ordered?  Yes: shower bench  What is the name of the medical supply agency? [employer] Were you able to get the supplies/equipment? yes Do you have any questions related to the use of the equipment or supplies? No  Functional Questionnaire: (I = Independent and D = Dependent) ADLs: Assist  Bathing/Dressing- Assist  Meal Prep- Assist  Eating- I  Maintaining continence- I  Transferring/Ambulation- I  Managing Meds- I  Follow up appointments reviewed:  PCP Hospital f/u appt confirmed? No  Patient unaware that she needed to follow u with PCP.will call office today to make an appt.Marland Kitchen [facility] f/u appt confirmed? Yes  Scheduled to see Dr. Annamaria Boots on 02/08/22 @ 1:30pm. Are transportation arrangements needed? No  If their condition worsens, is the pt aware to call PCP or go to the Emergency Dept.? Yes Was the patient provided with contact information for the PCP's office or ED? Yes Was to pt encouraged to  call back with questions or concerns? Yes  SDOH assessments and interventions completed:   Yes  Care Coordination Interventions Activated:  Yes   Care Coordination Interventions:  Education provided    Encounter Outcome:  Pt. Visit Completed    Enzo Montgomery, RN,BSN,CCM [facility] Management Telephonic Care Management Coordinator Direct Phone: [phone] Toll Free: [phone] Fax: [phone]

## 2022-01-19 NOTE — Telephone Encounter (Signed)
Spoke to pt--declined to Dr. Sharlet Salina for hospital f/u and requested to see Loletha Carrow, NP. Please advise

## 2022-01-19 NOTE — Telephone Encounter (Signed)
D:  [facility] interim coordinator called to check on pt and to see about rescheduling her [facility] consult.  Pt mentioned that she's feeling exhausted and would like to come in for a consult probably in November.  States she will contact the coordinator whenever she's ready to schedule.  A:  Provided pt with support.  Check back with pt in November 2023.  Inform [facility] team.  R:  Pt receptive.

## 2022-01-21 NOTE — Telephone Encounter (Signed)
Pt have an appt 01/31/22 w/ Dr. Sharlet Salina for hosp. F/u

## 2022-01-26 DIAGNOSIS — F411 Generalized anxiety disorder: Secondary | ICD-10-CM | POA: Diagnosis not present

## 2022-01-26 DIAGNOSIS — R69 Illness, unspecified: Secondary | ICD-10-CM | POA: Diagnosis not present

## 2022-01-26 DIAGNOSIS — F331 Major depressive disorder, recurrent, moderate: Secondary | ICD-10-CM | POA: Diagnosis not present

## 2022-01-31 ENCOUNTER — Encounter: Payer: Self-pay | Admitting: Internal Medicine

## 2022-01-31 ENCOUNTER — Ambulatory Visit (INDEPENDENT_AMBULATORY_CARE_PROVIDER_SITE_OTHER): Payer: Medicare HMO | Admitting: Internal Medicine

## 2022-01-31 VITALS — BP 114/78 | HR 76 | Temp 98.7°F | Ht 63.5 in | Wt 196.0 lb

## 2022-01-31 DIAGNOSIS — J441 Chronic obstructive pulmonary disease with (acute) exacerbation: Secondary | ICD-10-CM

## 2022-01-31 DIAGNOSIS — B001 Herpesviral vesicular dermatitis: Secondary | ICD-10-CM | POA: Diagnosis not present

## 2022-01-31 DIAGNOSIS — F17210 Nicotine dependence, cigarettes, uncomplicated: Secondary | ICD-10-CM

## 2022-01-31 DIAGNOSIS — D72829 Elevated white blood cell count, unspecified: Secondary | ICD-10-CM | POA: Insufficient documentation

## 2022-01-31 DIAGNOSIS — D72820 Lymphocytosis (symptomatic): Secondary | ICD-10-CM | POA: Diagnosis not present

## 2022-01-31 DIAGNOSIS — R030 Elevated blood-pressure reading, without diagnosis of hypertension: Secondary | ICD-10-CM

## 2022-01-31 DIAGNOSIS — U071 COVID-19: Secondary | ICD-10-CM | POA: Diagnosis not present

## 2022-01-31 DIAGNOSIS — R0602 Shortness of breath: Secondary | ICD-10-CM

## 2022-01-31 DIAGNOSIS — R0683 Snoring: Secondary | ICD-10-CM | POA: Diagnosis not present

## 2022-01-31 DIAGNOSIS — R69 Illness, unspecified: Secondary | ICD-10-CM | POA: Diagnosis not present

## 2022-01-31 LAB — CBC WITH DIFFERENTIAL/PLATELET
Basophils Absolute: 0.1 10*3/uL (ref 0.0–0.1)
Basophils Relative: 1.3 % (ref 0.0–3.0)
Eosinophils Absolute: 0.3 10*3/uL (ref 0.0–0.7)
Eosinophils Relative: 3.5 % (ref 0.0–5.0)
HCT: 37.3 % (ref 36.0–46.0)
Hemoglobin: 12.4 g/dL (ref 12.0–15.0)
Lymphocytes Relative: 33.6 % (ref 12.0–46.0)
Lymphs Abs: 3 10*3/uL (ref 0.7–4.0)
MCHC: 33.2 g/dL (ref 30.0–36.0)
MCV: 97.1 fl (ref 78.0–100.0)
Monocytes Absolute: 0.6 10*3/uL (ref 0.1–1.0)
Monocytes Relative: 6.5 % (ref 3.0–12.0)
Neutro Abs: 5 10*3/uL (ref 1.4–7.7)
Neutrophils Relative %: 55.1 % (ref 43.0–77.0)
Platelets: 216 10*3/uL (ref 150.0–400.0)
RBC: 3.84 Mil/uL — ABNORMAL LOW (ref 3.87–5.11)
RDW: 14.8 % (ref 11.5–15.5)
WBC: 9.1 10*3/uL (ref 4.0–10.5)

## 2022-01-31 MED ORDER — VALACYCLOVIR HCL 1 G PO TABS: 1000.0000 mg | ORAL_TABLET | Freq: Two times a day (BID) | ORAL | 0 refills | Status: AC

## 2022-01-31 NOTE — Assessment & Plan Note (Signed)
Rx valtrex 1 g PO BID 10 days course.

## 2022-01-31 NOTE — Assessment & Plan Note (Signed)
Checking home sleep test. She did have nocturnal hypoxemia in the hospital. Has a lot of fatigue even prior to this illness.

## 2022-01-31 NOTE — Telephone Encounter (Addendum)
LVM--to ([facility]) PT verbal order Frequency: 2W3 1W1 ok per Dr Sharlet Salina

## 2022-01-31 NOTE — Patient Instructions (Addendum)
We will get the forms filled out. We will call home health and get them approval.  We will stop the amlodipine.   We will get the sleep test.  I would recommend the RSV vaccine. Wait about 2 weeks then you can get the covid-19 booster.  We have sent in valtrex to take 1 pill twice a day for 10 days.

## 2022-01-31 NOTE — Progress Notes (Signed)
   Subjective:   Patient ID: Crystal Duarte, female    DOB: 1960-05-31, 61 y.o.   MRN: [id number]  HPI The patient is a 61 YO female coming in for hospital follow up (admitted with covid-19 and copd flare, follow up pulmonary next week, overall slow recovery). She has fallen twice at home and needs to start PT/OT. Breathing is improving gradually. Unsure if she will need amlodipine for BP long term.  PMH, [facility], social history reviewed and updated.  3 minute walk test with nadir O2 92% on room air.   Review of Systems  Constitutional:  Positive for activity change, appetite change and fatigue.  HENT: Negative.    Eyes: Negative.   Respiratory:  Positive for chest tightness and shortness of breath. Negative for cough.   Cardiovascular:  Negative for chest pain, palpitations and leg swelling.  Gastrointestinal:  Negative for abdominal distention, abdominal pain, constipation, diarrhea, nausea and vomiting.  Musculoskeletal:  Positive for gait problem.  Skin: Negative.   Neurological:  Positive for weakness.  Psychiatric/Behavioral: Negative.      Objective:  Physical Exam Constitutional:      Appearance: She is well-developed. She is ill-appearing.  HENT:     Head: Normocephalic and atraumatic.  Cardiovascular:     Rate and Rhythm: Normal rate and regular rhythm.  Pulmonary:     Effort: Pulmonary effort is normal. No respiratory distress.     Breath sounds: Normal breath sounds. No wheezing or rales.  Abdominal:     General: Bowel sounds are normal. There is no distension.     Palpations: Abdomen is soft.     Tenderness: There is no abdominal tenderness. There is no rebound.  Musculoskeletal:     Cervical back: Normal range of motion.  Skin:    General: Skin is warm and dry.  Neurological:     Mental Status: She is alert and oriented to person, place, and time.     Coordination: Coordination abnormal.     Comments: walker     Vitals:   01/31/22 1029  BP: 114/78   Pulse: 76  Temp: 98.7 F (37.1 C)  SpO2: 95%  Weight: 196 lb (88.9 kg)  Height: 5' 3.5" (1.613 m)    Assessment & Plan:

## 2022-01-31 NOTE — Assessment & Plan Note (Signed)
Likely due to high dose steroids in hospital. Checking CBC with diff today. Adjust as needed. We talked about how this could take 1-2 months to get back down to normal.

## 2022-01-31 NOTE — Assessment & Plan Note (Signed)
Overall improving. We discussed when to time next covid-19 booster shot during visit.

## 2022-01-31 NOTE — Assessment & Plan Note (Signed)
Taking propranolol 10 mg TID for anxiety. In hospital started on amlodipine 5 mg daily. We will stop this today due to normal BP and previously at goal off meds. Likely the high dose steroids and respiratory distress caused high BP readings. Monitor BP and if this elevates can resume amlodipine 5 mg daily.

## 2022-01-31 NOTE — Assessment & Plan Note (Signed)
Needs home health PT/OT. Forms filled out for her leave from work during flare. She will likely have extended recovery and estimate return date in 1 month around 03/03/22. If unable to return at that time will need follow up visit.

## 2022-01-31 NOTE — Assessment & Plan Note (Signed)
Quit about 1 month ago while in hospital and plans to maintain cessation. Congratulations given.

## 2022-02-01 NOTE — Telephone Encounter (Signed)
Papers are up front and waiting on patient to be picked up

## 2022-02-02 DIAGNOSIS — J309 Allergic rhinitis, unspecified: Secondary | ICD-10-CM | POA: Diagnosis not present

## 2022-02-02 DIAGNOSIS — J441 Chronic obstructive pulmonary disease with (acute) exacerbation: Secondary | ICD-10-CM | POA: Diagnosis not present

## 2022-02-02 DIAGNOSIS — R918 Other nonspecific abnormal finding of lung field: Secondary | ICD-10-CM | POA: Diagnosis not present

## 2022-02-02 DIAGNOSIS — Z6830 Body mass index (BMI) 30.0-30.9, adult: Secondary | ICD-10-CM | POA: Diagnosis not present

## 2022-02-02 DIAGNOSIS — I5033 Acute on chronic diastolic (congestive) heart failure: Secondary | ICD-10-CM | POA: Diagnosis not present

## 2022-02-02 DIAGNOSIS — I700 Atherosclerosis of aorta: Secondary | ICD-10-CM | POA: Diagnosis not present

## 2022-02-02 DIAGNOSIS — Z9181 History of falling: Secondary | ICD-10-CM | POA: Diagnosis not present

## 2022-02-02 DIAGNOSIS — R69 Illness, unspecified: Secondary | ICD-10-CM | POA: Diagnosis not present

## 2022-02-02 DIAGNOSIS — J9601 Acute respiratory failure with hypoxia: Secondary | ICD-10-CM | POA: Diagnosis not present

## 2022-02-02 DIAGNOSIS — G5621 Lesion of ulnar nerve, right upper limb: Secondary | ICD-10-CM | POA: Diagnosis not present

## 2022-02-02 DIAGNOSIS — E781 Pure hyperglyceridemia: Secondary | ICD-10-CM | POA: Diagnosis not present

## 2022-02-02 DIAGNOSIS — U071 COVID-19: Secondary | ICD-10-CM | POA: Diagnosis not present

## 2022-02-02 DIAGNOSIS — M19132 Post-traumatic osteoarthritis, left wrist: Secondary | ICD-10-CM | POA: Diagnosis not present

## 2022-02-02 DIAGNOSIS — E669 Obesity, unspecified: Secondary | ICD-10-CM | POA: Diagnosis not present

## 2022-02-02 DIAGNOSIS — Z7951 Long term (current) use of inhaled steroids: Secondary | ICD-10-CM | POA: Diagnosis not present

## 2022-02-02 DIAGNOSIS — J1282 Pneumonia due to coronavirus disease 2019: Secondary | ICD-10-CM | POA: Diagnosis not present

## 2022-02-02 DIAGNOSIS — E559 Vitamin D deficiency, unspecified: Secondary | ICD-10-CM | POA: Diagnosis not present

## 2022-02-02 DIAGNOSIS — M8580 Other specified disorders of bone density and structure, unspecified site: Secondary | ICD-10-CM | POA: Diagnosis not present

## 2022-02-02 DIAGNOSIS — M5412 Radiculopathy, cervical region: Secondary | ICD-10-CM | POA: Diagnosis not present

## 2022-02-02 DIAGNOSIS — F17210 Nicotine dependence, cigarettes, uncomplicated: Secondary | ICD-10-CM | POA: Diagnosis not present

## 2022-02-02 DIAGNOSIS — I110 Hypertensive heart disease with heart failure: Secondary | ICD-10-CM | POA: Diagnosis not present

## 2022-02-02 DIAGNOSIS — J206 Acute bronchitis due to rhinovirus: Secondary | ICD-10-CM | POA: Diagnosis not present

## 2022-02-02 DIAGNOSIS — I739 Peripheral vascular disease, unspecified: Secondary | ICD-10-CM | POA: Diagnosis not present

## 2022-02-02 NOTE — Telephone Encounter (Signed)
Patients daughter came into the office and has picked up the papers.

## 2022-02-03 DIAGNOSIS — Z7951 Long term (current) use of inhaled steroids: Secondary | ICD-10-CM | POA: Diagnosis not present

## 2022-02-03 DIAGNOSIS — U071 COVID-19: Secondary | ICD-10-CM | POA: Diagnosis not present

## 2022-02-03 DIAGNOSIS — E781 Pure hyperglyceridemia: Secondary | ICD-10-CM | POA: Diagnosis not present

## 2022-02-03 DIAGNOSIS — I700 Atherosclerosis of aorta: Secondary | ICD-10-CM | POA: Diagnosis not present

## 2022-02-03 DIAGNOSIS — J206 Acute bronchitis due to rhinovirus: Secondary | ICD-10-CM | POA: Diagnosis not present

## 2022-02-03 DIAGNOSIS — G5621 Lesion of ulnar nerve, right upper limb: Secondary | ICD-10-CM | POA: Diagnosis not present

## 2022-02-03 DIAGNOSIS — Z9181 History of falling: Secondary | ICD-10-CM | POA: Diagnosis not present

## 2022-02-03 DIAGNOSIS — J441 Chronic obstructive pulmonary disease with (acute) exacerbation: Secondary | ICD-10-CM | POA: Diagnosis not present

## 2022-02-03 DIAGNOSIS — J1282 Pneumonia due to coronavirus disease 2019: Secondary | ICD-10-CM | POA: Diagnosis not present

## 2022-02-03 DIAGNOSIS — F17210 Nicotine dependence, cigarettes, uncomplicated: Secondary | ICD-10-CM | POA: Diagnosis not present

## 2022-02-03 DIAGNOSIS — E669 Obesity, unspecified: Secondary | ICD-10-CM | POA: Diagnosis not present

## 2022-02-03 DIAGNOSIS — I5033 Acute on chronic diastolic (congestive) heart failure: Secondary | ICD-10-CM | POA: Diagnosis not present

## 2022-02-03 DIAGNOSIS — Z6830 Body mass index (BMI) 30.0-30.9, adult: Secondary | ICD-10-CM | POA: Diagnosis not present

## 2022-02-03 DIAGNOSIS — M5412 Radiculopathy, cervical region: Secondary | ICD-10-CM | POA: Diagnosis not present

## 2022-02-03 DIAGNOSIS — E559 Vitamin D deficiency, unspecified: Secondary | ICD-10-CM | POA: Diagnosis not present

## 2022-02-03 DIAGNOSIS — R918 Other nonspecific abnormal finding of lung field: Secondary | ICD-10-CM | POA: Diagnosis not present

## 2022-02-03 DIAGNOSIS — J9601 Acute respiratory failure with hypoxia: Secondary | ICD-10-CM | POA: Diagnosis not present

## 2022-02-03 DIAGNOSIS — R69 Illness, unspecified: Secondary | ICD-10-CM | POA: Diagnosis not present

## 2022-02-03 DIAGNOSIS — J309 Allergic rhinitis, unspecified: Secondary | ICD-10-CM | POA: Diagnosis not present

## 2022-02-03 DIAGNOSIS — M8580 Other specified disorders of bone density and structure, unspecified site: Secondary | ICD-10-CM | POA: Diagnosis not present

## 2022-02-03 DIAGNOSIS — M19132 Post-traumatic osteoarthritis, left wrist: Secondary | ICD-10-CM | POA: Diagnosis not present

## 2022-02-03 DIAGNOSIS — I110 Hypertensive heart disease with heart failure: Secondary | ICD-10-CM | POA: Diagnosis not present

## 2022-02-03 DIAGNOSIS — I739 Peripheral vascular disease, unspecified: Secondary | ICD-10-CM | POA: Diagnosis not present

## 2022-02-07 DIAGNOSIS — J1282 Pneumonia due to coronavirus disease 2019: Secondary | ICD-10-CM | POA: Diagnosis not present

## 2022-02-07 DIAGNOSIS — I110 Hypertensive heart disease with heart failure: Secondary | ICD-10-CM | POA: Diagnosis not present

## 2022-02-07 DIAGNOSIS — Z9181 History of falling: Secondary | ICD-10-CM | POA: Diagnosis not present

## 2022-02-07 DIAGNOSIS — I700 Atherosclerosis of aorta: Secondary | ICD-10-CM | POA: Diagnosis not present

## 2022-02-07 DIAGNOSIS — R69 Illness, unspecified: Secondary | ICD-10-CM | POA: Diagnosis not present

## 2022-02-07 DIAGNOSIS — M19132 Post-traumatic osteoarthritis, left wrist: Secondary | ICD-10-CM | POA: Diagnosis not present

## 2022-02-07 DIAGNOSIS — M8580 Other specified disorders of bone density and structure, unspecified site: Secondary | ICD-10-CM | POA: Diagnosis not present

## 2022-02-07 DIAGNOSIS — U071 COVID-19: Secondary | ICD-10-CM | POA: Diagnosis not present

## 2022-02-07 DIAGNOSIS — I5033 Acute on chronic diastolic (congestive) heart failure: Secondary | ICD-10-CM | POA: Diagnosis not present

## 2022-02-07 DIAGNOSIS — Z7951 Long term (current) use of inhaled steroids: Secondary | ICD-10-CM | POA: Diagnosis not present

## 2022-02-07 DIAGNOSIS — F17210 Nicotine dependence, cigarettes, uncomplicated: Secondary | ICD-10-CM | POA: Diagnosis not present

## 2022-02-07 DIAGNOSIS — I739 Peripheral vascular disease, unspecified: Secondary | ICD-10-CM | POA: Diagnosis not present

## 2022-02-07 DIAGNOSIS — E559 Vitamin D deficiency, unspecified: Secondary | ICD-10-CM | POA: Diagnosis not present

## 2022-02-07 DIAGNOSIS — M5412 Radiculopathy, cervical region: Secondary | ICD-10-CM | POA: Diagnosis not present

## 2022-02-07 DIAGNOSIS — J309 Allergic rhinitis, unspecified: Secondary | ICD-10-CM | POA: Diagnosis not present

## 2022-02-07 DIAGNOSIS — J441 Chronic obstructive pulmonary disease with (acute) exacerbation: Secondary | ICD-10-CM | POA: Diagnosis not present

## 2022-02-07 DIAGNOSIS — G5621 Lesion of ulnar nerve, right upper limb: Secondary | ICD-10-CM | POA: Diagnosis not present

## 2022-02-07 DIAGNOSIS — E781 Pure hyperglyceridemia: Secondary | ICD-10-CM | POA: Diagnosis not present

## 2022-02-07 DIAGNOSIS — Z6830 Body mass index (BMI) 30.0-30.9, adult: Secondary | ICD-10-CM | POA: Diagnosis not present

## 2022-02-07 DIAGNOSIS — J206 Acute bronchitis due to rhinovirus: Secondary | ICD-10-CM | POA: Diagnosis not present

## 2022-02-07 DIAGNOSIS — R918 Other nonspecific abnormal finding of lung field: Secondary | ICD-10-CM | POA: Diagnosis not present

## 2022-02-07 DIAGNOSIS — E669 Obesity, unspecified: Secondary | ICD-10-CM | POA: Diagnosis not present

## 2022-02-07 DIAGNOSIS — J9601 Acute respiratory failure with hypoxia: Secondary | ICD-10-CM | POA: Diagnosis not present

## 2022-02-08 ENCOUNTER — Encounter (HOSPITAL_COMMUNITY): Payer: Self-pay

## 2022-02-08 ENCOUNTER — Emergency Department (HOSPITAL_COMMUNITY)
Admission: EM | Admit: 2022-02-08 | Discharge: 2022-02-09 | Disposition: A | Discharge status: Home / Self Care | Payer: Medicare HMO | Attending: Emergency Medicine | Admitting: Emergency Medicine

## 2022-02-08 ENCOUNTER — Ambulatory Visit: Payer: Medicare HMO | Admitting: Internal Medicine

## 2022-02-08 ENCOUNTER — Telehealth: Payer: Self-pay | Admitting: Pulmonary Disease

## 2022-02-08 ENCOUNTER — Encounter: Payer: Self-pay | Admitting: Internal Medicine

## 2022-02-08 ENCOUNTER — Other Ambulatory Visit: Payer: Self-pay

## 2022-02-08 VITALS — BP 116/64 | HR 95 | Ht 65.0 in | Wt 200.0 lb

## 2022-02-08 DIAGNOSIS — M8580 Other specified disorders of bone density and structure, unspecified site: Secondary | ICD-10-CM | POA: Diagnosis not present

## 2022-02-08 DIAGNOSIS — J9601 Acute respiratory failure with hypoxia: Secondary | ICD-10-CM | POA: Diagnosis not present

## 2022-02-08 DIAGNOSIS — G4734 Idiopathic sleep related nonobstructive alveolar hypoventilation: Secondary | ICD-10-CM

## 2022-02-08 DIAGNOSIS — E781 Pure hyperglyceridemia: Secondary | ICD-10-CM | POA: Diagnosis not present

## 2022-02-08 DIAGNOSIS — J206 Acute bronchitis due to rhinovirus: Secondary | ICD-10-CM | POA: Diagnosis not present

## 2022-02-08 DIAGNOSIS — Z8616 Personal history of COVID-19: Secondary | ICD-10-CM | POA: Diagnosis not present

## 2022-02-08 DIAGNOSIS — I110 Hypertensive heart disease with heart failure: Secondary | ICD-10-CM | POA: Diagnosis not present

## 2022-02-08 DIAGNOSIS — E669 Obesity, unspecified: Secondary | ICD-10-CM | POA: Diagnosis not present

## 2022-02-08 DIAGNOSIS — K449 Diaphragmatic hernia without obstruction or gangrene: Secondary | ICD-10-CM | POA: Diagnosis not present

## 2022-02-08 DIAGNOSIS — R69 Illness, unspecified: Secondary | ICD-10-CM | POA: Diagnosis not present

## 2022-02-08 DIAGNOSIS — F17210 Nicotine dependence, cigarettes, uncomplicated: Secondary | ICD-10-CM | POA: Diagnosis not present

## 2022-02-08 DIAGNOSIS — R0609 Other forms of dyspnea: Secondary | ICD-10-CM | POA: Diagnosis not present

## 2022-02-08 DIAGNOSIS — Z87891 Personal history of nicotine dependence: Secondary | ICD-10-CM | POA: Insufficient documentation

## 2022-02-08 DIAGNOSIS — I739 Peripheral vascular disease, unspecified: Secondary | ICD-10-CM | POA: Diagnosis not present

## 2022-02-08 DIAGNOSIS — I5033 Acute on chronic diastolic (congestive) heart failure: Secondary | ICD-10-CM | POA: Diagnosis not present

## 2022-02-08 DIAGNOSIS — Z7951 Long term (current) use of inhaled steroids: Secondary | ICD-10-CM | POA: Insufficient documentation

## 2022-02-08 DIAGNOSIS — M19132 Post-traumatic osteoarthritis, left wrist: Secondary | ICD-10-CM | POA: Diagnosis not present

## 2022-02-08 DIAGNOSIS — G5621 Lesion of ulnar nerve, right upper limb: Secondary | ICD-10-CM | POA: Diagnosis not present

## 2022-02-08 DIAGNOSIS — R531 Weakness: Secondary | ICD-10-CM | POA: Diagnosis not present

## 2022-02-08 DIAGNOSIS — R918 Other nonspecific abnormal finding of lung field: Secondary | ICD-10-CM | POA: Diagnosis not present

## 2022-02-08 DIAGNOSIS — J441 Chronic obstructive pulmonary disease with (acute) exacerbation: Secondary | ICD-10-CM

## 2022-02-08 DIAGNOSIS — M5412 Radiculopathy, cervical region: Secondary | ICD-10-CM | POA: Diagnosis not present

## 2022-02-08 DIAGNOSIS — Z6830 Body mass index (BMI) 30.0-30.9, adult: Secondary | ICD-10-CM | POA: Diagnosis not present

## 2022-02-08 DIAGNOSIS — E559 Vitamin D deficiency, unspecified: Secondary | ICD-10-CM | POA: Diagnosis not present

## 2022-02-08 DIAGNOSIS — Z9181 History of falling: Secondary | ICD-10-CM | POA: Diagnosis not present

## 2022-02-08 DIAGNOSIS — J1282 Pneumonia due to coronavirus disease 2019: Secondary | ICD-10-CM | POA: Diagnosis not present

## 2022-02-08 DIAGNOSIS — I700 Atherosclerosis of aorta: Secondary | ICD-10-CM | POA: Diagnosis not present

## 2022-02-08 DIAGNOSIS — R0602 Shortness of breath: Secondary | ICD-10-CM | POA: Diagnosis present

## 2022-02-08 DIAGNOSIS — J309 Allergic rhinitis, unspecified: Secondary | ICD-10-CM | POA: Diagnosis not present

## 2022-02-08 DIAGNOSIS — U071 COVID-19: Secondary | ICD-10-CM | POA: Diagnosis not present

## 2022-02-08 LAB — D-DIMER, QUANTITATIVE: D-Dimer, Quant: 0.95 mcg/mL FEU — ABNORMAL HIGH (ref <0.50)

## 2022-02-08 MED ORDER — PREDNISONE 10 MG PO TABS: 10.0000 mg | ORAL_TABLET | Freq: Every day | ORAL | 0 refills | Status: DC

## 2022-02-08 NOTE — Progress Notes (Unsigned)
03/25/21- 78 yoF Smoker (10.5 pk yrs) transferring to my service for COPD, complicated by COPD mixed type, aortic atherosclerosis, multiple pulmonary nodules, bipolar disorder. Hx Coccidioidomycosis,(lung bx [facility] 2001) Depression,  Blanche East, NP 11/15- acute visit. PCP had> levaquin, pred taper.Cardiology> Zio patch, stress test. - Anoro, Singulair, albuterol hfa, Note propranolol 20 tid,  Covid vax-3 Moderna Flu vax-had She does not think Anoro, Trelegy or Breztri had much effect.  Long family history of smoke exposure-many smokers.  She feels stable dyspnea on exertion with scant white sputum and no acute events. History VATS resection 2001 at [facility] for what was apparently a cocci nodule.  She had lived in [place], [place] in the past. Follows with cardiology-aortic atherosclerosis. We discussed availability of pulmonary rehabilitation and the low-dose chest CT screening program. Discussed pneumococcal vaccine, recommended we get her a nebulizer machine and referred to pharmacy smoking program. PFT 03/25/21-severe obstruction with no response to bronchodilator (FEV1 1.23/46%, FVC 57%), moderate restriction, minimal diffusion defect CXR IMPRESSION: 03/03/21 No acute cardiopulmonary disease. Postoperative changes in the RIGHT upper lobe.  02/08/22- 58 yoF former smoker (10.5 pk yrs) followed  for COPD, complicated by  aortic atherosclerosis, multiple pulmonary nodules, bipolar disorder. Hx Coccidioidomycosis,(lung bx [employer] 2001) Depression, dCHF,  -Neb Duoneb, Ventolin hfa, Singulair,  Hosp f/u  recent hosp Covid and bronchitis                    Daughter here -----Pt was in the ICU for 2wks for COVID, bronchitis, difficulty breathing. Pt states she was feeling better but symptoms have started again over the last 2 days  Hosp 9/30- 10/14 discharged on room air. Has been weak, unsteady and "brain fog" since leaving hospital and has fallen twice onto left side.  Negative w/u for PE while in hospital.  Starting about 3 days ago she has had increased DOE, sustained heavy pressure sensation Left upper chest, wheeze. Has also had tenderness and sharp pain in base of R neck, seeming to extend down R arm to R 5th finger. No blood,fever, chills, purulent sputum or dysuria. Using neb 2-3x/ day- minimal help. Had flu and RSV vax in hosp. Has quit smoking! CTaPE 01/05/22- IMPRESSION: There is no evidence of pulmonary artery embolism. There is no evidence of thoracic aortic dissection. There is no focal pulmonary consolidation.  Fatty liver.  ROS-see HPI  + = positive Constitutional:    weight loss, night sweats, fevers, chills, fatigue, lassitude. HEENT:    headaches, difficulty swallowing, tooth/dental problems, sore throat,       sneezing, itching, ear ache, nasal congestion, post nasal drip, snoring CV:    chest pain, orthopnea, PND, swelling in lower extremities, anasarca,                        dizziness, palpitations Resp:   shortness of breath with exertion or at rest.                productive cough,   non-productive cough, coughing up of blood.              change in color of mucus.  wheezing.   Skin:    rash or lesions. GI:  No-   heartburn, indigestion, abdominal pain, nausea, vomiting, diarrhea,                 change in bowel habits, loss of appetite GU: dysuria, change in color of urine, no urgency or frequency.  flank pain. MS:   joint pain, stiffness, decreased range of motion, back pain. Neuro-     nothing unusual Psych:  change in mood or affect.  depression or anxiety.   memory loss.  OBJ- Physical Exam General- Alert, Oriented, Affect-appropriate, Distress- none acute Skin- rash-none, lesions- none, excoriation- none Lymphadenopathy- none Head- atraumatic            Eyes- Gross vision intact, PERRLA, conjunctivae and secretions clear            Ears- Hearing, canals-normal            Nose- Clear, no-Septal dev, mucus, polyps,  erosion, perforation             Throat- Mallampati II , mucosa clear , drainage- none, tonsils- atrophic Neck- +tender to firm pressure base of R neck- no mass or bruit Chest - symmetrical excursion , unlabored           Heart/CV- RRR , no murmur , no gallop  , no rub, nl s1 s2                           - JVD- none , edema- none, stasis changes- none, varices- none           Lung- clear to P&A, wheeze- none, cough- none , dullness-none, rub- none           Chest wall-  Abd-  Br/ Gen/ Rectal- Not done, not indicated Extrem- cyanosis- none, clubbing, none, atrophy- none, strength- nl Neuro- grossly intact to observation

## 2022-02-08 NOTE — Patient Instructions (Signed)
Order- CTa chest PE     dyspnea on exertion  Order- lab D-dimer     dx dyspnea on exertion  Order- schedule overnight oximetry on room air    dx nocturnal hypoxemia  Script sent prednisone

## 2022-02-08 NOTE — ED Triage Notes (Signed)
Pt reports SOB beginning yesterday. Pt was in ICU 3 weeks ago for Covid. Seen at pulmonologist today and told her D-dimer was elevated and to go to the ED. Pt reports having cold sweats as well. Denies fever. Reports weakness/fatigue.

## 2022-02-09 ENCOUNTER — Encounter: Payer: Self-pay | Admitting: Internal Medicine

## 2022-02-09 ENCOUNTER — Emergency Department (HOSPITAL_COMMUNITY): Payer: Medicare HMO

## 2022-02-09 DIAGNOSIS — R531 Weakness: Secondary | ICD-10-CM | POA: Diagnosis not present

## 2022-02-09 DIAGNOSIS — K449 Diaphragmatic hernia without obstruction or gangrene: Secondary | ICD-10-CM | POA: Diagnosis not present

## 2022-02-09 LAB — CBC WITH DIFFERENTIAL/PLATELET
Abs Immature Granulocytes: 0.23 10*3/uL — ABNORMAL HIGH (ref 0.00–0.07)
Basophils Absolute: 0.1 10*3/uL (ref 0.0–0.1)
Basophils Relative: 1 %
Eosinophils Absolute: 0.1 10*3/uL (ref 0.0–0.5)
Eosinophils Relative: 1 %
HCT: 37.5 % (ref 36.0–46.0)
Hemoglobin: 12.2 g/dL (ref 12.0–15.0)
Immature Granulocytes: 2 %
Lymphocytes Relative: 34 %
Lymphs Abs: 4.6 10*3/uL — ABNORMAL HIGH (ref 0.7–4.0)
MCH: 32.1 pg (ref 26.0–34.0)
MCHC: 32.5 g/dL (ref 30.0–36.0)
MCV: 98.7 fL (ref 80.0–100.0)
Monocytes Absolute: 0.8 10*3/uL (ref 0.1–1.0)
Monocytes Relative: 6 %
Neutro Abs: 7.9 10*3/uL — ABNORMAL HIGH (ref 1.7–7.7)
Neutrophils Relative %: 56 %
Platelets: 240 10*3/uL (ref 150–400)
RBC: 3.8 MIL/uL — ABNORMAL LOW (ref 3.87–5.11)
RDW: 13.6 % (ref 11.5–15.5)
WBC: 13.7 10*3/uL — ABNORMAL HIGH (ref 4.0–10.5)
nRBC: 0.1 % (ref 0.0–0.2)

## 2022-02-09 LAB — BASIC METABOLIC PANEL
Anion gap: 7 (ref 5–15)
BUN: 12 mg/dL (ref 8–23)
CO2: 28 mmol/L (ref 22–32)
Calcium: 8.8 mg/dL — ABNORMAL LOW (ref 8.9–10.3)
Chloride: 101 mmol/L (ref 98–111)
Creatinine, Ser: 0.73 mg/dL (ref 0.44–1.00)
GFR, Estimated: >60 mL/min (ref >60)
Glucose, Bld: 142 mg/dL — ABNORMAL HIGH (ref 70–99)
Potassium: 3.9 mmol/L (ref 3.5–5.1)
Sodium: 136 mmol/L (ref 135–145)

## 2022-02-09 MED ORDER — IOHEXOL 350 MG/ML SOLN
75.0000 mL | Freq: Once | INTRAVENOUS | Status: AC | PRN
  Administered 2022-02-09: 75 mL via INTRAVENOUS

## 2022-02-09 MED ORDER — IPRATROPIUM-ALBUTEROL 0.5-2.5 (3) MG/3ML IN SOLN
3.0000 mL | Freq: Once | RESPIRATORY_TRACT | Status: AC
  Administered 2022-02-09: 3 mL via RESPIRATORY_TRACT
  Filled 2022-02-09: qty 3

## 2022-02-09 MED ORDER — ALBUTEROL SULFATE (2.5 MG/3ML) 0.083% IN NEBU
2.5000 mg | INHALATION_SOLUTION | Freq: Once | RESPIRATORY_TRACT | Status: AC
  Administered 2022-02-09: 2.5 mg via RESPIRATORY_TRACT
  Filled 2022-02-09: qty 3

## 2022-02-09 MED ORDER — IPRATROPIUM-ALBUTEROL 0.5-2.5 (3) MG/3ML IN SOLN: 3.0000 mL | RESPIRATORY_TRACT | Status: DC

## 2022-02-09 MED ORDER — IPRATROPIUM-ALBUTEROL 0.5-2.5 (3) MG/3ML IN SOLN
3.0000 mL | RESPIRATORY_TRACT | Status: DC
  Administered 2022-02-09: 3 mL via RESPIRATORY_TRACT
  Filled 2022-02-09: qty 3

## 2022-02-09 NOTE — Addendum Note (Signed)
Addended by: Meredith Staggers A on: 02/09/2022 01:02 PM   Modules accepted: Orders

## 2022-02-09 NOTE — Assessment & Plan Note (Signed)
This might be basis for her complaint of right-sided neck tenderness extending down right arm.  She and her daughter say that the 2 times when she fell, she landed on her left side and did not strike her head. Plan -We are ordering CT chest which may indicate adenopathy or other obvious abnormality in that region.

## 2022-02-09 NOTE — Assessment & Plan Note (Signed)
She reports that she has stopped smoking as of her hospitalization in October 2023.  We offered encouragement and hopefully she can stick with this.

## 2022-02-09 NOTE — Assessment & Plan Note (Addendum)
This may be COPD exacerbation with increased dyspnea and wheezing about a week after she ended prednisone taper.  Uncertain how her complaint of pain at the base of her neck and down her right arm or residual post-COVID symptoms fit in.. She is cautious about restarting prednisone because it aggravates insomnia. Plan-prednisone 10 mg daily.  Overnight oximetry.  D-dimer.  CT chest PE protocol.

## 2022-02-09 NOTE — ED Notes (Signed)
Patient verbalizes understanding of discharge instructions. Opportunity for questioning and answers were provided. Armband removed by staff, pt discharged from ED. Ambulated out to lobby  

## 2022-02-09 NOTE — ED Provider Notes (Signed)
[facility] DEPT Provider Note: Georgena Spurling, MD, FACEP  CSN: [id number] MRN: [id number] ARRIVAL: 02/08/22 at 2307 ROOM: [facility]   HISTORY OF PRESENT ILLNESS  02/09/22 12:10 AM Crystal Duarte is a 61 y.o. female with COPD who was hospitalized (ICU) with COVID about 3 weeks ago.  Her baseline shortness of breath is worsened over the past several days.  She was seen by her pulmonologist yesterday who ordered a D-dimer and it was found to be elevated at 0.95.  He ordered this because the patient is having some pleuritic anterior chest pain radiating into her right scapula.  She rates this pain as a 6 out of 10.  She has not gotten adequate relief of her shortness of breath with her home nebulizer.  She has not had a fever.  She does feel generally weak.   Past Medical History:  Diagnosis Date   Coccidioidomycosis, pulmonary (HCC)    COPD (chronic obstructive pulmonary disease) (HCC)    Depression    Hx of adenomatous polyp of colon 12/04/2014   Major depressive disorder    since 14, electroconvulsive therapy, and transcranial magnetic stimulation recently for 6 weeks every day    Osteopenia 01/2019   T score -2.1 FRAX 9.6% / 2.1% stable from prior DEXA   Panic attack    PONV (postoperative nausea and vomiting)    VAIN I (vaginal intraepithelial neoplasia grade I) 2015   Positive HPV negative subtype 16, 18/45    Past Surgical History:  Procedure Laterality Date   ABDOMINAL HYSTERECTOMY     TAH BSO   BILATERAL VATS ABLATION     vats for biopsy due to infection   BREAST EXCISIONAL BIOPSY Right    benign   CATARACT EXTRACTION     COLONOSCOPY  2001   hemorrhoidectomy   HEMORRHOID SURGERY     LUNG SURGERY  2001   VATS surgery    OVARIAN CYST REMOVAL  1970's   ULNAR NERVE TRANSPOSITION Left 12/06/2018   Procedure: LEFT ULNAR NERVE DECOMPRESSION,  ;  Surgeon: Leanora Cover, MD;  Location: [facility];  Service:  Orthopedics;  Laterality: Left;   VESICOVAGINAL FISTULA CLOSURE W/ TAH  2005   WRIST ARTHROSCOPY Left 03/13/2018   Procedure: ARTHROSCOPY LEFT WRIST WITH DEBRIDEMENT;  Surgeon: Leanora Cover, MD;  Location: [facility];  Service: Orthopedics;  Laterality: Left;  block    Family History  Problem Relation Age of Onset   Colon polyps Mother    Asthma Mother    Ovarian cancer Mother    Varicose Veins Mother    Colon polyps Father    Heart disease Father    Peripheral vascular disease Father    Lung cancer Maternal Grandmother    Colon cancer Neg Hx    Esophageal cancer Neg Hx    Stomach cancer Neg Hx    Pancreatic cancer Neg Hx    Liver disease Neg Hx     Social History   Tobacco Use   Smoking status: Former    Packs/day: 1.00    Years: 42.00    Total pack years: 42.00    Types: Cigarettes    Quit date: 12/31/2021    Years since quitting: 0.1    Passive exposure: Past   Smokeless tobacco: Never  Vaping Use   Vaping Use: Never used  Substance Use Topics   Alcohol use: No    Alcohol/week: 0.0 standard drinks of alcohol  Drug use: No    Prior to Admission medications   Medication Sig Start Date End Date Taking? Authorizing Provider  albuterol (VENTOLIN HFA) 108 (90 Base) MCG/ACT inhaler Inhale 2 puffs into the lungs every 4 (four) hours as needed. 9/67/89   Prince Rome, PA-C  busPIRone (BUSPAR) 10 MG tablet Take two tablets three times daily for anxiety. 05/18/21   Mozingo, Berdie Ogren, NP  Cholecalciferol (VITAMIN D3 PO) Take by mouth.    [provider]  gabapentin (NEURONTIN) 400 MG capsule Take 1 capsule (400 mg total) by mouth 2 (two) times daily. Patient taking differently: Take 600 mg by mouth daily. 05/18/21   Mozingo, Berdie Ogren, NP  ipratropium-albuterol (DUONEB) 0.5-2.5 (3) MG/3ML SOLN Take 3 mLs by nebulization every 6 (six) hours as needed. Patient taking differently: Take 3 mLs by nebulization every 6 (six) hours as needed  (sob/wheezing). 3/81/01   Prince Rome, PA-C  montelukast (SINGULAIR) 10 MG tablet TAKE ONE TABLET BY MOUTH EVERY NIGHT AT BEDTIME 12/08/21   Martyn Ehrich, NP  Multiple Vitamin (MULTI-VITAMINS) TABS Take 1 tablet by mouth daily.     [provider]  omeprazole (PRILOSEC) 20 MG capsule TAKE ONE CAPSULE BY MOUTH DAILY 12/08/21   Gatha Mayer, MD  predniSONE (DELTASONE) 10 MG tablet Take 1 tablet (10 mg total) by mouth daily with breakfast. 02/08/22   Deneise Lever, MD  propranolol (INDERAL) 10 MG tablet Take 1 tablet (10 mg total) by mouth 3 (three) times daily. 12/13/21   Mozingo, Berdie Ogren, NP  rosuvastatin (CRESTOR) 20 MG tablet Take 1 tablet (20 mg total) by mouth daily. 02/15/21   Werner Lean, MD  traZODone (DESYREL) 100 MG tablet Take 2.5 tablets (250 mg total) by mouth at bedtime. 05/18/21   Mozingo, Berdie Ogren, NP  triamcinolone ointment (KENALOG) 0.5 % Apply 1 Application topically 2 (two) times daily. 10/06/21   Chrzanowski, Jami B, NP  valACYclovir (VALTREX) 1000 MG tablet Take 1 tablet (1,000 mg total) by mouth 2 (two) times daily for 10 days. 01/31/22 02/10/22  [name], MD  venlafaxine XR (EFFEXOR-XR) 150 MG 24 hr capsule Take 1 capsule (150 mg total) by mouth daily with breakfast. 05/18/21   Mozingo, Berdie Ogren, NP    Allergies Other, Procaine, Latuda [lurasidone hcl], and Sulfa antibiotics   REVIEW OF SYSTEMS  Negative except as noted here or in the History of Present Illness.   PHYSICAL EXAMINATION  Initial Vital Signs Blood pressure 117/73, pulse 95, temperature 98.4 F (36.9 C), temperature source Oral, resp. rate 20, height '5\' 5"'$  (1.651 m), weight 84.8 kg, SpO2 97 %.  Examination General: Well-developed, well-nourished female in no acute distress; appearance consistent with age of record HENT: normocephalic; atraumatic Eyes: Normal appearance Neck: supple Heart: regular rate and rhythm Lungs: Decreased breath  sounds throughout Abdomen: soft; nondistended; nontender; bowel sounds present Extremities: No deformity; full range of motion Neurologic: Awake, alert and oriented; motor function intact in all extremities and symmetric; no facial droop Skin: Warm and dry Psychiatric: Normal mood and affect   RESULTS  Summary of this visit's results, reviewed and interpreted by myself:   EKG Interpretation  Date/Time:  Wednesday February 09 2022 00:02:27 EST Ventricular Rate:  95 PR Interval:  137 QRS Duration: 89 QT Interval:  371 QTC Calculation: 467 R Axis:   76 Text Interpretation: Sinus rhythm Normal ECG No significant change was found Confirmed by Maggie Senseney [id number]) on 02/09/2022 12:19:46 AM  Laboratory Studies: Results for orders placed or performed during the hospital encounter of 02/08/22 (from the past 24 hour(s))  CBC with Differential     Status: Abnormal   Collection Time: 02/09/22 12:26 AM  Result Value Ref Range   WBC 13.7 (H) 4.0 - 10.5 K/uL   RBC 3.80 (L) 3.87 - 5.11 MIL/uL   Hemoglobin 12.2 12.0 - 15.0 g/dL   HCT 37.5 36.0 - 46.0 %   MCV 98.7 80.0 - 100.0 fL   MCH 32.1 26.0 - 34.0 pg   MCHC 32.5 30.0 - 36.0 g/dL   RDW 13.6 11.5 - 15.5 %   Platelets 240 150 - 400 K/uL   nRBC 0.1 0.0 - 0.2 %   Neutrophils Relative % 56 %   Neutro Abs 7.9 (H) 1.7 - 7.7 K/uL   Lymphocytes Relative 34 %   Lymphs Abs 4.6 (H) 0.7 - 4.0 K/uL   Monocytes Relative 6 %   Monocytes Absolute 0.8 0.1 - 1.0 K/uL   Eosinophils Relative 1 %   Eosinophils Absolute 0.1 0.0 - 0.5 K/uL   Basophils Relative 1 %   Basophils Absolute 0.1 0.0 - 0.1 K/uL   Immature Granulocytes 2 %   Abs Immature Granulocytes 0.23 (H) 0.00 - 0.07 K/uL  Basic metabolic panel     Status: Abnormal   Collection Time: 02/09/22 12:26 AM  Result Value Ref Range   Sodium 136 135 - 145 mmol/L   Potassium 3.9 3.5 - 5.1 mmol/L   Chloride 101 98 - 111 mmol/L   CO2 28 22 - 32 mmol/L   Glucose, Bld 142 (H) 70 - 99 mg/dL    BUN 12 8 - 23 mg/dL   Creatinine, Ser 0.73 0.44 - 1.00 mg/dL   Calcium 8.8 (L) 8.9 - 10.3 mg/dL   GFR, Estimated >60 >60 mL/min   Anion gap 7 5 - 15   Imaging Studies: CT Angio Chest PE W and/or Wo Contrast  Result Date: 02/09/2022 CLINICAL DATA:  Weakness and elevated D-dimer. EXAM: CT ANGIOGRAPHY CHEST WITH CONTRAST TECHNIQUE: Multidetector CT imaging of the chest was performed using the standard protocol during bolus administration of intravenous contrast. Multiplanar CT image reconstructions and MIPs were obtained to evaluate the vascular anatomy. RADIATION DOSE REDUCTION: This exam was performed according to the departmental dose-optimization program which includes automated exposure control, adjustment of the mA and/or kV according to patient size and/or use of iterative reconstruction technique. CONTRAST:  50m OMNIPAQUE IOHEXOL 350 MG/ML SOLN COMPARISON:  January 05, 2022 FINDINGS: Cardiovascular: The thoracic aorta is normal in appearance. Satisfactory opacification of the pulmonary arteries to the segmental level. No evidence of pulmonary embolism. Normal heart size. No pericardial effusion. Mediastinum/Nodes: No enlarged mediastinal, hilar, or axillary lymph nodes. Thyroid gland, trachea, and esophagus demonstrate no significant findings. Lungs/Pleura: Surgical sutures are again seen within the anterolateral aspect of the right upper lobe. There is no evidence of an acute infiltrate, pleural effusion or pneumothorax. Upper Abdomen: There is a small hiatal hernia. Musculoskeletal: No chest wall abnormality. No acute or significant osseous findings. Review of the MIP images confirms the above findings. IMPRESSION: 1. No evidence of pulmonary embolism or other acute intrathoracic process. 2. Small hiatal hernia. Electronically Signed   By: TVirgina NorfolkM.D.   On: 02/09/2022 01:57    ED COURSE and MDM  Nursing notes, initial and subsequent vitals signs, including pulse oximetry, reviewed and  interpreted by myself.  Vitals:   02/08/22 2334 02/08/22 2337  BP: 117/73  Pulse: 95   Resp: 20   Temp: 98.4 F (36.9 C)   TempSrc: Oral   SpO2: 97%   Weight:  84.8 kg  Height:  '5\' 5"'$  (1.651 m)   Medications  iohexol (OMNIPAQUE) 350 MG/ML injection 75 mL (75 mLs Intravenous Contrast Given 02/09/22 0141)  albuterol (PROVENTIL) (2.5 MG/3ML) 0.083% nebulizer solution 2.5 mg (2.5 mg Nebulization Given 02/09/22 0344)  ipratropium-albuterol (DUONEB) 0.5-2.5 (3) MG/3ML nebulizer solution 3 mL (3 mLs Nebulization Given 02/09/22 0345)   3:00 AM No evidence of pulmonary embolism or other acute intrathoracic process.  Patient's air movement has improved with first neb treatment but patient still feels short of breath.  We will administer a second neb treatment.  4:18 AM Patient's breathing improved after second neb treatment.  She still does not feel back to her baseline but she is in no distress.  I do not believe there is any indication for admission at this time.  She was given a prescription for steroids by Dr. Annamaria Boots, her pulmonologist, but will not start those until this morning because she prefers not to take them at night due to insomnia.  She was advised to return if her breathing worsens.  She does have a nebulizer machine at home.   PROCEDURES  Procedures   ED DIAGNOSES     ICD-10-CM   1. COPD exacerbation ([facility])  J44.1          Tawonna Esquer, MD 02/09/22 [phone]

## 2022-02-14 DIAGNOSIS — I700 Atherosclerosis of aorta: Secondary | ICD-10-CM | POA: Diagnosis not present

## 2022-02-14 DIAGNOSIS — J9601 Acute respiratory failure with hypoxia: Secondary | ICD-10-CM | POA: Diagnosis not present

## 2022-02-14 DIAGNOSIS — E559 Vitamin D deficiency, unspecified: Secondary | ICD-10-CM | POA: Diagnosis not present

## 2022-02-14 DIAGNOSIS — E669 Obesity, unspecified: Secondary | ICD-10-CM | POA: Diagnosis not present

## 2022-02-14 DIAGNOSIS — M8580 Other specified disorders of bone density and structure, unspecified site: Secondary | ICD-10-CM | POA: Diagnosis not present

## 2022-02-14 DIAGNOSIS — Z7951 Long term (current) use of inhaled steroids: Secondary | ICD-10-CM | POA: Diagnosis not present

## 2022-02-14 DIAGNOSIS — U071 COVID-19: Secondary | ICD-10-CM | POA: Diagnosis not present

## 2022-02-14 DIAGNOSIS — R69 Illness, unspecified: Secondary | ICD-10-CM | POA: Diagnosis not present

## 2022-02-14 DIAGNOSIS — J441 Chronic obstructive pulmonary disease with (acute) exacerbation: Secondary | ICD-10-CM | POA: Diagnosis not present

## 2022-02-14 DIAGNOSIS — Z6830 Body mass index (BMI) 30.0-30.9, adult: Secondary | ICD-10-CM | POA: Diagnosis not present

## 2022-02-14 DIAGNOSIS — F17210 Nicotine dependence, cigarettes, uncomplicated: Secondary | ICD-10-CM | POA: Diagnosis not present

## 2022-02-14 DIAGNOSIS — J206 Acute bronchitis due to rhinovirus: Secondary | ICD-10-CM | POA: Diagnosis not present

## 2022-02-14 DIAGNOSIS — I739 Peripheral vascular disease, unspecified: Secondary | ICD-10-CM | POA: Diagnosis not present

## 2022-02-14 DIAGNOSIS — Z9181 History of falling: Secondary | ICD-10-CM | POA: Diagnosis not present

## 2022-02-14 DIAGNOSIS — M5412 Radiculopathy, cervical region: Secondary | ICD-10-CM | POA: Diagnosis not present

## 2022-02-14 DIAGNOSIS — J309 Allergic rhinitis, unspecified: Secondary | ICD-10-CM | POA: Diagnosis not present

## 2022-02-14 DIAGNOSIS — E781 Pure hyperglyceridemia: Secondary | ICD-10-CM | POA: Diagnosis not present

## 2022-02-14 DIAGNOSIS — J1282 Pneumonia due to coronavirus disease 2019: Secondary | ICD-10-CM | POA: Diagnosis not present

## 2022-02-14 DIAGNOSIS — R918 Other nonspecific abnormal finding of lung field: Secondary | ICD-10-CM | POA: Diagnosis not present

## 2022-02-14 DIAGNOSIS — G5621 Lesion of ulnar nerve, right upper limb: Secondary | ICD-10-CM | POA: Diagnosis not present

## 2022-02-14 DIAGNOSIS — I110 Hypertensive heart disease with heart failure: Secondary | ICD-10-CM | POA: Diagnosis not present

## 2022-02-14 DIAGNOSIS — I5033 Acute on chronic diastolic (congestive) heart failure: Secondary | ICD-10-CM | POA: Diagnosis not present

## 2022-02-14 DIAGNOSIS — M19132 Post-traumatic osteoarthritis, left wrist: Secondary | ICD-10-CM | POA: Diagnosis not present

## 2022-02-22 ENCOUNTER — Ambulatory Visit (HOSPITAL_BASED_OUTPATIENT_CLINIC_OR_DEPARTMENT_OTHER): Payer: Medicare HMO | Admitting: Internal Medicine

## 2022-02-22 DIAGNOSIS — R69 Illness, unspecified: Secondary | ICD-10-CM | POA: Diagnosis not present

## 2022-02-22 DIAGNOSIS — M5412 Radiculopathy, cervical region: Secondary | ICD-10-CM | POA: Diagnosis not present

## 2022-02-22 DIAGNOSIS — U071 COVID-19: Secondary | ICD-10-CM | POA: Diagnosis not present

## 2022-02-22 DIAGNOSIS — I700 Atherosclerosis of aorta: Secondary | ICD-10-CM | POA: Diagnosis not present

## 2022-02-22 DIAGNOSIS — J441 Chronic obstructive pulmonary disease with (acute) exacerbation: Secondary | ICD-10-CM | POA: Diagnosis not present

## 2022-02-22 DIAGNOSIS — Z9181 History of falling: Secondary | ICD-10-CM | POA: Diagnosis not present

## 2022-02-22 DIAGNOSIS — I5033 Acute on chronic diastolic (congestive) heart failure: Secondary | ICD-10-CM | POA: Diagnosis not present

## 2022-02-22 DIAGNOSIS — J309 Allergic rhinitis, unspecified: Secondary | ICD-10-CM | POA: Diagnosis not present

## 2022-02-22 DIAGNOSIS — J1282 Pneumonia due to coronavirus disease 2019: Secondary | ICD-10-CM | POA: Diagnosis not present

## 2022-02-22 DIAGNOSIS — F17210 Nicotine dependence, cigarettes, uncomplicated: Secondary | ICD-10-CM | POA: Diagnosis not present

## 2022-02-22 DIAGNOSIS — J206 Acute bronchitis due to rhinovirus: Secondary | ICD-10-CM | POA: Diagnosis not present

## 2022-02-22 DIAGNOSIS — Z6830 Body mass index (BMI) 30.0-30.9, adult: Secondary | ICD-10-CM | POA: Diagnosis not present

## 2022-02-22 DIAGNOSIS — I110 Hypertensive heart disease with heart failure: Secondary | ICD-10-CM | POA: Diagnosis not present

## 2022-02-22 DIAGNOSIS — J9601 Acute respiratory failure with hypoxia: Secondary | ICD-10-CM | POA: Diagnosis not present

## 2022-02-22 DIAGNOSIS — G5621 Lesion of ulnar nerve, right upper limb: Secondary | ICD-10-CM | POA: Diagnosis not present

## 2022-02-22 DIAGNOSIS — Z7951 Long term (current) use of inhaled steroids: Secondary | ICD-10-CM | POA: Diagnosis not present

## 2022-02-22 DIAGNOSIS — R918 Other nonspecific abnormal finding of lung field: Secondary | ICD-10-CM | POA: Diagnosis not present

## 2022-02-22 DIAGNOSIS — E559 Vitamin D deficiency, unspecified: Secondary | ICD-10-CM | POA: Diagnosis not present

## 2022-02-22 DIAGNOSIS — E669 Obesity, unspecified: Secondary | ICD-10-CM | POA: Diagnosis not present

## 2022-02-22 DIAGNOSIS — M19132 Post-traumatic osteoarthritis, left wrist: Secondary | ICD-10-CM | POA: Diagnosis not present

## 2022-02-22 DIAGNOSIS — R0683 Snoring: Secondary | ICD-10-CM

## 2022-02-22 DIAGNOSIS — M8580 Other specified disorders of bone density and structure, unspecified site: Secondary | ICD-10-CM | POA: Diagnosis not present

## 2022-02-22 DIAGNOSIS — I739 Peripheral vascular disease, unspecified: Secondary | ICD-10-CM | POA: Diagnosis not present

## 2022-02-22 DIAGNOSIS — E781 Pure hyperglyceridemia: Secondary | ICD-10-CM | POA: Diagnosis not present

## 2022-03-02 ENCOUNTER — Ambulatory Visit: Payer: Medicare HMO | Admitting: Adult Health

## 2022-03-02 DIAGNOSIS — G4734 Idiopathic sleep related nonobstructive alveolar hypoventilation: Secondary | ICD-10-CM | POA: Diagnosis not present

## 2022-03-04 ENCOUNTER — Ambulatory Visit (HOSPITAL_BASED_OUTPATIENT_CLINIC_OR_DEPARTMENT_OTHER): Payer: Medicare HMO | Attending: Internal Medicine | Admitting: Internal Medicine

## 2022-03-06 ENCOUNTER — Other Ambulatory Visit: Payer: Self-pay | Admitting: Primary Care

## 2022-03-06 ENCOUNTER — Other Ambulatory Visit: Payer: Self-pay | Admitting: Internal Medicine

## 2022-03-09 NOTE — Progress Notes (Signed)
HPI F former smoker (10.5 pk yrs) followed  for COPD, complicated by  aortic atherosclerosis, multiple pulmonary nodules, bipolar disorder. Hx Coccidioidomycosis,(lung bx [facility] 2001) Depression, dCHF,  PFT 03/25/21-severe obstruction with no response to bronchodilator (FEV1 1.23/46%, FVC 57%), moderate restriction, minimal diffusion defect a1AT 03/10/22- 122 Pi MM  WNL Walk test on room air 03/10/2022-3 laps with lowest saturation 93% maximum heart rate 99 =======================================================================   02/08/22- 62 yoF former smoker (10.5 pk yrs) followed  for COPD, complicated by  aortic atherosclerosis, multiple pulmonary nodules, bipolar disorder. Hx Coccidioidomycosis,(lung bx [employer] [facility] 2001) Depression, dCHF,  -Neb Duoneb, Ventolin hfa, Singulair,  Hosp f/u  recent hosp Covid and bronchitis                    Daughter here -----Pt was in the ICU for 2wks for COVID, bronchitis, difficulty breathing. Pt states she was feeling better but symptoms have started again over the last 2 days  Hosp 9/30- 10/14 discharged on room air. Has been weak, unsteady and "brain fog" since leaving hospital and has fallen twice onto left side. Negative w/u for PE while in hospital.  Starting about 3 days ago she has had increased DOE, sustained heavy pressure sensation Left upper chest, wheeze. Has also had tenderness and sharp pain in base of R neck, seeming to extend down R arm to R 5th finger. No blood,fever, chills, purulent sputum or dysuria. Using neb 2-3x/ day- minimal help. Had flu and RSV vax in hosp. Has quit smoking! CTaPE 01/05/22- IMPRESSION: There is no evidence of pulmonary artery embolism. There is no evidence of thoracic aortic dissection. There is no focal pulmonary consolidation.  Fatty liver.  03/10/22- 37 yoF former smoker (10.5 pk yrs) followed  for COPD, complicated by  aortic Atherosclerosis, multiple pulmonary Nodules, Bipolar disorder. Hx  Coccidioidomycosis,(lung bx [employer] 2001) Depression, dCHF,  -Neb Duoneb, Ventolin hfa, Singulair,  Covid vax- Flu vax-had ED 02/08/22- COPD exacerbation, elevated D-dimer(.96)> neb treatments, started prednisone taper ONOX 03/02/22- 7 hours </= 88%    Starting Home O2 for Sleep ECHO 01/02/22- inadequate to assess for PAH (Mother, our patient Helen Hashimoto also has home oxygen for sleep and rarely uses it.) Noting wheeze again since she has been off prednisone.  PFT 1 year ago had shown severe COPD, surprising for modest smoking history.  We will check for alpha-1 antitrypsin deficiency. Walk test on room air 03/10/2022-3 laps with lowest saturation 93% maximum heart rate 99   //??Biologic ??// CTachest PE-  IMPRESSION: 1. No evidence of pulmonary embolism or other acute intrathoracic process. 2. Small hiatal hernia.  ROS-see HPI  + = positive Constitutional:    weight loss, night sweats, fevers, chills, fatigue, lassitude. HEENT:    headaches, difficulty swallowing, tooth/dental problems, sore throat,       sneezing, itching, ear ache, nasal congestion, post nasal drip, snoring CV:    chest pain, orthopnea, PND, swelling in lower extremities, anasarca,                        dizziness, palpitations Resp:   shortness of breath with exertion or at rest.                productive cough,   non-productive cough, coughing up of blood.              change in color of mucus.  wheezing.   Skin:    rash or lesions. GI:  No-   heartburn, indigestion, abdominal pain, nausea, vomiting, diarrhea,                 change in bowel habits, loss of appetite GU: dysuria, change in color of urine, no urgency or frequency.   flank pain. MS:   joint pain, stiffness, decreased range of motion, back pain. Neuro-     nothing unusual Psych:  change in mood or affect.  depression or anxiety.   memory loss.  OBJ- Physical Exam General- Alert, Oriented, Affect-appropriate, Distress- none acute Skin-  rash-none, lesions- none, excoriation- none Lymphadenopathy- none Head- atraumatic            Eyes- Gross vision intact, PERRLA, conjunctivae and secretions clear            Ears- Hearing, canals-normal            Nose- Clear, no-Septal dev, mucus, polyps, erosion, perforation             Throat- Mallampati II , mucosa clear , drainage- none, tonsils- atrophic Neck-  Chest - symmetrical excursion , unlabored           Heart/CV- RRR , no murmur , no gallop  , no rub, nl s1 s2                           - JVD- none , edema- none, stasis changes- none, varices- none           Lung- clear to P&A, wheeze- none, cough- none , dullness-none, rub- none           Chest wall-  Abd-  Br/ Gen/ Rectal- Not done, not indicated Extrem- cyanosis- none, clubbing, none, atrophy- none, strength- nl Neuro- grossly intact to observation

## 2022-03-10 ENCOUNTER — Ambulatory Visit: Payer: Medicare HMO | Admitting: Internal Medicine

## 2022-03-10 ENCOUNTER — Encounter: Payer: Self-pay | Admitting: Internal Medicine

## 2022-03-10 VITALS — BP 130/82 | HR 81 | Ht 65.0 in | Wt 208.2 lb

## 2022-03-10 DIAGNOSIS — J449 Chronic obstructive pulmonary disease, unspecified: Secondary | ICD-10-CM

## 2022-03-10 DIAGNOSIS — R0609 Other forms of dyspnea: Secondary | ICD-10-CM

## 2022-03-10 DIAGNOSIS — G4734 Idiopathic sleep related nonobstructive alveolar hypoventilation: Secondary | ICD-10-CM | POA: Diagnosis not present

## 2022-03-10 DIAGNOSIS — R918 Other nonspecific abnormal finding of lung field: Secondary | ICD-10-CM

## 2022-03-10 LAB — CBC WITH DIFFERENTIAL/PLATELET
Basophils Absolute: 0.1 10*3/uL (ref 0.0–0.1)
Basophils Relative: 0.6 % (ref 0.0–3.0)
Eosinophils Absolute: 0.4 10*3/uL (ref 0.0–0.7)
Eosinophils Relative: 3.4 % (ref 0.0–5.0)
HCT: 37.8 % (ref 36.0–46.0)
Hemoglobin: 12.6 g/dL (ref 12.0–15.0)
Lymphocytes Relative: 28.3 % (ref 12.0–46.0)
Lymphs Abs: 3 10*3/uL (ref 0.7–4.0)
MCHC: 33.3 g/dL (ref 30.0–36.0)
MCV: 96.1 fl (ref 78.0–100.0)
Monocytes Absolute: 0.8 10*3/uL (ref 0.1–1.0)
Monocytes Relative: 7.2 % (ref 3.0–12.0)
Neutro Abs: 6.4 10*3/uL (ref 1.4–7.7)
Neutrophils Relative %: 60.5 % (ref 43.0–77.0)
Platelets: 268 10*3/uL (ref 150.0–400.0)
RBC: 3.94 Mil/uL (ref 3.87–5.11)
RDW: 14.7 % (ref 11.5–15.5)
WBC: 10.5 10*3/uL (ref 4.0–10.5)

## 2022-03-10 MED ORDER — TRELEGY ELLIPTA 100-62.5-25 MCG/ACT IN AEPB: 1.0000 | INHALATION_SPRAY | Freq: Every day | RESPIRATORY_TRACT | 0 refills | Status: DC

## 2022-03-10 NOTE — Patient Instructions (Addendum)
Order- Walk Test on room air    dx dyspnea on exertion  Order- lab- CBC w diff, alpha-1-antitrypsin  Order- new DME, new Home O2 for sleep 2L/ minute

## 2022-03-11 DIAGNOSIS — R0609 Other forms of dyspnea: Secondary | ICD-10-CM | POA: Diagnosis not present

## 2022-03-11 DIAGNOSIS — J441 Chronic obstructive pulmonary disease with (acute) exacerbation: Secondary | ICD-10-CM | POA: Diagnosis not present

## 2022-03-18 LAB — ALPHA-1 ANTITRYPSIN PHENOTYPE: A-1 Antitrypsin, Ser: 122 mg/dL (ref 83–199)

## 2022-03-22 DIAGNOSIS — F411 Generalized anxiety disorder: Secondary | ICD-10-CM | POA: Diagnosis not present

## 2022-03-22 DIAGNOSIS — F331 Major depressive disorder, recurrent, moderate: Secondary | ICD-10-CM | POA: Diagnosis not present

## 2022-03-22 DIAGNOSIS — R69 Illness, unspecified: Secondary | ICD-10-CM | POA: Diagnosis not present

## 2022-03-23 ENCOUNTER — Telehealth: Payer: Self-pay | Admitting: Internal Medicine

## 2022-03-23 NOTE — Telephone Encounter (Signed)
Spoke with patient advised that Dr. Tenna Child her increasing the o2 at night to 3L. She verbalized understanding. Nothing further needed at this time.

## 2022-03-23 NOTE — Telephone Encounter (Signed)
Called and spoke with patient. She states at night she has been experiencing more shortness and wants to know if she can increase her o2 from 2L to 3L.   Please advise

## 2022-03-23 NOTE — Telephone Encounter (Signed)
Yes ok for sleep O2 to be 2-3 Liters

## 2022-03-24 DIAGNOSIS — L720 Epidermal cyst: Secondary | ICD-10-CM | POA: Diagnosis not present

## 2022-03-24 DIAGNOSIS — D2222 Melanocytic nevi of left ear and external auricular canal: Secondary | ICD-10-CM | POA: Diagnosis not present

## 2022-03-24 DIAGNOSIS — L239 Allergic contact dermatitis, unspecified cause: Secondary | ICD-10-CM | POA: Diagnosis not present

## 2022-03-24 DIAGNOSIS — D485 Neoplasm of uncertain behavior of skin: Secondary | ICD-10-CM | POA: Diagnosis not present

## 2022-03-29 ENCOUNTER — Emergency Department (HOSPITAL_BASED_OUTPATIENT_CLINIC_OR_DEPARTMENT_OTHER): Payer: Medicare HMO

## 2022-03-29 ENCOUNTER — Encounter (HOSPITAL_BASED_OUTPATIENT_CLINIC_OR_DEPARTMENT_OTHER): Payer: Self-pay

## 2022-03-29 ENCOUNTER — Emergency Department (HOSPITAL_BASED_OUTPATIENT_CLINIC_OR_DEPARTMENT_OTHER)
Admission: EM | Admit: 2022-03-29 | Discharge: 2022-03-29 | Disposition: A | Discharge status: Home / Self Care | Payer: Medicare HMO | Attending: Emergency Medicine | Admitting: Emergency Medicine

## 2022-03-29 ENCOUNTER — Other Ambulatory Visit: Payer: Self-pay

## 2022-03-29 ENCOUNTER — Telehealth: Payer: Self-pay | Admitting: Internal Medicine

## 2022-03-29 DIAGNOSIS — Z8616 Personal history of COVID-19: Secondary | ICD-10-CM | POA: Diagnosis not present

## 2022-03-29 DIAGNOSIS — R0602 Shortness of breath: Secondary | ICD-10-CM | POA: Diagnosis not present

## 2022-03-29 DIAGNOSIS — Z7951 Long term (current) use of inhaled steroids: Secondary | ICD-10-CM | POA: Insufficient documentation

## 2022-03-29 DIAGNOSIS — J449 Chronic obstructive pulmonary disease, unspecified: Secondary | ICD-10-CM | POA: Insufficient documentation

## 2022-03-29 DIAGNOSIS — Z1152 Encounter for screening for COVID-19: Secondary | ICD-10-CM | POA: Diagnosis not present

## 2022-03-29 DIAGNOSIS — J441 Chronic obstructive pulmonary disease with (acute) exacerbation: Secondary | ICD-10-CM | POA: Diagnosis not present

## 2022-03-29 DIAGNOSIS — J9811 Atelectasis: Secondary | ICD-10-CM | POA: Diagnosis not present

## 2022-03-29 DIAGNOSIS — U071 COVID-19: Secondary | ICD-10-CM | POA: Diagnosis not present

## 2022-03-29 LAB — BASIC METABOLIC PANEL
Anion gap: 7 (ref 5–15)
BUN: 11 mg/dL (ref 8–23)
CO2: 32 mmol/L (ref 22–32)
Calcium: 9.6 mg/dL (ref 8.9–10.3)
Chloride: 103 mmol/L (ref 98–111)
Creatinine, Ser: 0.76 mg/dL (ref 0.44–1.00)
GFR, Estimated: >60 mL/min (ref >60)
Glucose, Bld: 87 mg/dL (ref 70–99)
Potassium: 4.1 mmol/L (ref 3.5–5.1)
Sodium: 142 mmol/L (ref 135–145)

## 2022-03-29 LAB — CBC WITH DIFFERENTIAL/PLATELET
Abs Immature Granulocytes: 0.04 10*3/uL (ref 0.00–0.07)
Basophils Absolute: 0.1 10*3/uL (ref 0.0–0.1)
Basophils Relative: 1 %
Eosinophils Absolute: 0.7 10*3/uL — ABNORMAL HIGH (ref 0.0–0.5)
Eosinophils Relative: 7 %
HCT: 36.9 % (ref 36.0–46.0)
Hemoglobin: 12 g/dL (ref 12.0–15.0)
Immature Granulocytes: 0 %
Lymphocytes Relative: 29 %
Lymphs Abs: 3.2 10*3/uL (ref 0.7–4.0)
MCH: 31.7 pg (ref 26.0–34.0)
MCHC: 32.5 g/dL (ref 30.0–36.0)
MCV: 97.6 fL (ref 80.0–100.0)
Monocytes Absolute: 0.8 10*3/uL (ref 0.1–1.0)
Monocytes Relative: 7 %
Neutro Abs: 6.2 10*3/uL (ref 1.7–7.7)
Neutrophils Relative %: 56 %
Platelets: 230 10*3/uL (ref 150–400)
RBC: 3.78 MIL/uL — ABNORMAL LOW (ref 3.87–5.11)
RDW: 13.2 % (ref 11.5–15.5)
WBC: 11 10*3/uL — ABNORMAL HIGH (ref 4.0–10.5)
nRBC: 0 % (ref 0.0–0.2)

## 2022-03-29 LAB — RESP PANEL BY RT-PCR (RSV, FLU A&B, COVID)  RVPGX2
Influenza A by PCR: NEGATIVE
Influenza B by PCR: NEGATIVE
Resp Syncytial Virus by PCR: NEGATIVE
SARS Coronavirus 2 by RT PCR: NEGATIVE

## 2022-03-29 MED ORDER — PREDNISONE 20 MG PO TABS: 60.0000 mg | ORAL_TABLET | Freq: Every day | ORAL | 0 refills | Status: AC

## 2022-03-29 MED ORDER — METHYLPREDNISOLONE SODIUM SUCC 125 MG IJ SOLR
125.0000 mg | Freq: Once | INTRAMUSCULAR | Status: AC
  Administered 2022-03-29: 125 mg via INTRAVENOUS
  Filled 2022-03-29: qty 2

## 2022-03-29 MED ORDER — IPRATROPIUM-ALBUTEROL 0.5-2.5 (3) MG/3ML IN SOLN: 3.0000 mL | Freq: Once | RESPIRATORY_TRACT | Status: AC

## 2022-03-29 MED ORDER — ALBUTEROL SULFATE (2.5 MG/3ML) 0.083% IN NEBU: 2.5000 mg | INHALATION_SOLUTION | Freq: Once | RESPIRATORY_TRACT | Status: AC

## 2022-03-29 MED ORDER — TRELEGY ELLIPTA 100-62.5-25 MCG/ACT IN AEPB: 1.0000 | INHALATION_SPRAY | Freq: Every day | RESPIRATORY_TRACT | 0 refills | Status: DC

## 2022-03-29 MED ORDER — IPRATROPIUM-ALBUTEROL 0.5-2.5 (3) MG/3ML IN SOLN
RESPIRATORY_TRACT | Status: AC
  Administered 2022-03-29: 3 mL via RESPIRATORY_TRACT
  Filled 2022-03-29: qty 3

## 2022-03-29 MED ORDER — ALBUTEROL SULFATE (2.5 MG/3ML) 0.083% IN NEBU
INHALATION_SOLUTION | RESPIRATORY_TRACT | Status: AC
  Administered 2022-03-29: 5 mg
  Filled 2022-03-29: qty 3

## 2022-03-29 MED ORDER — MAGNESIUM SULFATE 2 GM/50ML IV SOLN
2.0000 g | Freq: Once | INTRAVENOUS | Status: AC
  Administered 2022-03-29: 2 g via INTRAVENOUS
  Filled 2022-03-29: qty 50

## 2022-03-29 NOTE — ED Notes (Addendum)
Patient ambulated around hall on Room Air per EDP. Patient states she wears 2LNC to sleep. SpO2 maintained 87-88% ambulating. Patient states mildly increased WOB upon ambulating.

## 2022-03-29 NOTE — Telephone Encounter (Signed)
This is a Dr. Annamaria Boots  patient , wll send to him for review

## 2022-03-29 NOTE — ED Triage Notes (Signed)
Patient here POV from Home.  Endorses SOB due to COPD. Began today and has worsened since it began.  SOB in Triage. A&Ox4. GCS 15. Ambulatory.

## 2022-03-29 NOTE — Telephone Encounter (Signed)
It was on med list. Can you have her come by and pick up 2 samples of Trelegy 100 to try please.

## 2022-03-29 NOTE — Telephone Encounter (Signed)
Please advise her to use her Trelegy inhaler 1 inhalation then rinse mouth, oncee daily. This is what it is for.

## 2022-03-29 NOTE — Telephone Encounter (Signed)
Spoke to pt and informed her of Dr Janee Morn recommendations. Pt verbalized understanding. Pt states she will come and pick up samples of Trelegy 100 In AM. Nothing further needed.

## 2022-03-29 NOTE — ED Provider Notes (Signed)
[facility] Provider Note   CSN: [id number] Arrival date & time: 03/29/22  1752     History  Chief Complaint  Patient presents with   Shortness of Breath    Crystal Duarte is a 61 y.o. female.  Patient here with shortness of breath.  History of COPD.  Feels like the same.  Symptoms worse for the last few days.  May be cough and congestion.  May be weather changes.  Denies any chest pain or weakness or numbness or abdominal pain, nausea, vomiting, diarrhea.  Breathing treatments have home have helped a little bit.  The history is provided by the patient.       Home Medications Prior to Admission medications   Medication Sig Start Date End Date Taking? Authorizing Provider  predniSONE (DELTASONE) 20 MG tablet Take 3 tablets (60 mg total) by mouth daily for 5 days. 03/29/22 04/03/22 Yes Recie Cirrincione, DO  albuterol (VENTOLIN HFA) 108 (90 Base) MCG/ACT inhaler Inhale 2 puffs into the lungs every 4 (four) hours as needed. 6/78/93   Prince Rome, PA-C  busPIRone (BUSPAR) 10 MG tablet Take two tablets three times daily for anxiety. 05/18/21   Mozingo, Berdie Ogren, NP  Cholecalciferol (VITAMIN D3 PO) Take by mouth.    [provider]  Fluticasone-Umeclidin-Vilant (TRELEGY ELLIPTA) 100-62.5-25 MCG/ACT AEPB Inhale 1 puff into the lungs daily. 03/10/22   Deneise Lever, MD  Fluticasone-Umeclidin-Vilant (TRELEGY ELLIPTA) 100-62.5-25 MCG/ACT AEPB Inhale 1 each into the lungs daily. 03/29/22   Deneise Lever, MD  gabapentin (NEURONTIN) 400 MG capsule Take 1 capsule (400 mg total) by mouth 2 (two) times daily. Patient taking differently: Take 600 mg by mouth daily. 05/18/21   Mozingo, Berdie Ogren, NP  ipratropium-albuterol (DUONEB) 0.5-2.5 (3) MG/3ML SOLN Take 3 mLs by nebulization every 6 (six) hours as needed. Patient taking differently: Take 3 mLs by nebulization every 6 (six) hours as needed (sob/wheezing). 11/12/15   Prince Rome, PA-C  montelukast (SINGULAIR) 10 MG tablet TAKE ONE TABLET BY MOUTH EVERY NIGHT AT BEDTIME 03/07/22   Martyn Ehrich, NP  Multiple Vitamin (MULTI-VITAMINS) TABS Take 1 tablet by mouth daily.     [provider]  omeprazole (PRILOSEC) 20 MG capsule TAKE ONE CAPSULE BY MOUTH DAILY 12/08/21   Gatha Mayer, MD  propranolol (INDERAL) 10 MG tablet Take 1 tablet (10 mg total) by mouth 3 (three) times daily. 12/13/21   Mozingo, Berdie Ogren, NP  rosuvastatin (CRESTOR) 20 MG tablet Take 1 tablet (20 mg total) by mouth daily. Pt needs to make appt with provider for further refills - 1st attempt 03/07/22   Werner Lean, MD  traZODone (DESYREL) 100 MG tablet Take 2.5 tablets (250 mg total) by mouth at bedtime. 05/18/21   Mozingo, Berdie Ogren, NP  triamcinolone ointment (KENALOG) 0.5 % Apply 1 Application topically 2 (two) times daily. 10/06/21   Rubbie Battiest B, NP  venlafaxine (EFFEXOR) 75 MG tablet  12/16/21   [provider]  venlafaxine XR (EFFEXOR-XR) 150 MG 24 hr capsule Take 1 capsule (150 mg total) by mouth daily with breakfast. 05/18/21   Mozingo, Berdie Ogren, NP      Allergies    Other, Procaine, Latuda [lurasidone hcl], and Sulfa antibiotics    Review of Systems   Review of Systems  Physical Exam Updated Vital Signs BP (!) 148/85   Pulse 93   Temp 98.2 F (36.8 C) (Oral)   Resp (!) 23   Ht  $'5\' 5"'r$  (1.651 m)   Wt 94.4 kg   SpO2 94%   BMI 34.63 kg/m  Physical Exam Vitals and nursing note reviewed.  Constitutional:      General: She is not in acute distress.    Appearance: She is well-developed. She is not ill-appearing.  HENT:     Head: Normocephalic and atraumatic.     Mouth/Throat:     Mouth: Mucous membranes are moist.  Eyes:     Extraocular Movements: Extraocular movements intact.     Conjunctiva/sclera: Conjunctivae normal.     Pupils: Pupils are equal, round, and reactive to light.  Cardiovascular:     Rate and Rhythm:  Normal rate and regular rhythm.     Pulses: Normal pulses.     Heart sounds: Normal heart sounds. No murmur heard. Pulmonary:     Effort: Pulmonary effort is normal. Tachypnea present. No respiratory distress.     Breath sounds: Decreased breath sounds and wheezing present.  Abdominal:     Palpations: Abdomen is soft.     Tenderness: There is no abdominal tenderness.  Musculoskeletal:     Cervical back: Neck supple.     Right lower leg: No edema.     Left lower leg: No edema.  Skin:    General: Skin is warm and dry.     Capillary Refill: Capillary refill takes less than 2 seconds.  Neurological:     General: No focal deficit present.     Mental Status: She is alert.     ED Results / Procedures / Treatments   Labs (all labs ordered are listed, but only abnormal results are displayed) Labs Reviewed  CBC WITH DIFFERENTIAL/PLATELET - Abnormal; Notable for the following components:      Result Value   WBC 11.0 (*)    RBC 3.78 (*)    Eosinophils Absolute 0.7 (*)    All other components within normal limits  RESP PANEL BY RT-PCR (RSV, FLU A&B, COVID)  [id number]  BASIC METABOLIC PANEL    EKG EKG Interpretation  Date/Time:  Tuesday March 29 2022 18:07:49 EST Ventricular Rate:  102 PR Interval:  136 QRS Duration: 72 QT Interval:  336 QTC Calculation: 437 R Axis:   66 Text Interpretation: Sinus tachycardia Otherwise normal ECG When compared with ECG of 09-Feb-2022 00:02, PREVIOUS ECG IS PRESENT Confirmed by Lennice Sites ([id number]) on 03/29/2022 6:21:39 PM  Radiology DG Chest Portable 1 View  Result Date: 03/29/2022 CLINICAL DATA:  Shortness of breath, COVID-19 positivity EXAM: PORTABLE CHEST 1 VIEW COMPARISON:  01/04/2022 FINDINGS: Cardiac shadow is within normal limits. Lungs are well aerated bilaterally. No focal infiltrate is seen. Postsurgical changes are noted in the right apex. Mild left basilar atelectasis is seen. IMPRESSION: Minimal left basilar atelectasis.  Electronically Signed   By: Inez Catalina M.D.   On: 03/29/2022 18:25    Procedures Procedures    Medications Ordered in ED Medications  ipratropium-albuterol (DUONEB) 0.5-2.5 (3) MG/3ML nebulizer solution 3 mL (3 mLs Nebulization Given 03/29/22 1858)  albuterol (PROVENTIL) (2.5 MG/3ML) 0.083% nebulizer solution 2.5 mg (5 mg Nebulization Given 03/29/22 1833)  magnesium sulfate IVPB 2 g 50 mL (0 g Intravenous Stopped 03/29/22 1950)  methylPREDNISolone sodium succinate (SOLU-MEDROL) 125 mg/2 mL injection 125 mg (125 mg Intravenous Given 03/29/22 1843)    ED Course/ Medical Decision Making/ A&P                           Medical  Decision Making Amount and/or Complexity of Data Reviewed Radiology: ordered.  Risk Prescription drug management.   Desma Wilkowski Honor is here with shortness of breath.  History of COPD.  Patient arrives shortness of breath.  Room air oxygenation is 94%.  Was placed on breathing treatment with DuoNebs and given IV Solu-Medrol upon arrival.  I have also order IV magnesium.  Differential diagnosis is COPD exacerbation possibly in the setting of viral process or weather changes or pneumonia.  She is not having any chest pain or abdominal pain or nausea or vomiting.  I have no concern for ACS or PE.  EKG shows sinus rhythm.  No ischemic changes.  She is wheezing throughout on exam.  CBC, BMP, COVID and flu test, chest x-ray ordered.  Per my review interpretation labs no significant anemia, electrolyte abnormality, kidney injury or leukocytosis.  Patient with chest x-ray per my review and interpretation shows no obvious pneumonia.  COVID and flu and RSV test are negative.  She is feeling much better after breathing treatments.  She ambulated with respiratory therapy and oxygen stayed between 89 to 92%.  Shared decision was made and ultimately patient felt good to go home and continue breathing treatments at home and continue steroids at home.  She was given return  precautions and discharged in the ED in good condition.  This chart was dictated using voice recognition software.  Despite best efforts to proofread,  errors can occur which can change the documentation meaning.         Final Clinical Impression(s) / ED Diagnoses Final diagnoses:  Chronic obstructive pulmonary disease, unspecified COPD type ([place])    Rx / DC Orders ED Discharge Orders          Ordered    predniSONE (DELTASONE) 20 MG tablet  Daily        03/29/22 2140              Lennice Sites, DO 03/29/22 2317

## 2022-03-29 NOTE — Telephone Encounter (Signed)
Pt states she had SOB, cough, deep wheezing only when she exhales. Using the neb. (ipratropium-albuterol (DUONEB) 0.5-2.5 (3) MG/3ML SOLN) also using Albuterol currently not on prednisone or taking trelegy. Pt was struggling to talk, pt also advised to go to ED. I informed pt I will send message to provider. Pt verbalized understanding.

## 2022-03-29 NOTE — ED Notes (Signed)
Pt trial on RA between 90-92%, RT bedside talking to pt and improved to 94-95%. Attempting ambulatory saturations with RT.

## 2022-03-30 NOTE — Telephone Encounter (Signed)
Lets see iI I or an APP can see her next week after she finishes her current course

## 2022-03-30 NOTE — Telephone Encounter (Signed)
Pt was seen at the ER yesterday at [facility]. There they gave her breathing treatment and steroids. In the note from the ER they state she was feeling much better and they sent her home to continue breathing treatment and steroids.  Pt has called in today upset that she was not seen here or given a steroid.   Dr. Annamaria Boots do you have any recommendations for her? She was just given a steroid at [facility] yesterday for 5 days.

## 2022-03-30 NOTE — Telephone Encounter (Signed)
Called and spoke with patient, she states she is feeling much better and breathing has improved. I advised if things change to call us back and we will work her in to be seen. She verbalized understanding. Nothing further needed.

## 2022-04-01 ENCOUNTER — Telehealth: Payer: Self-pay | Admitting: Internal Medicine

## 2022-04-01 NOTE — Telephone Encounter (Signed)
Called patient and went over that she was needing trelegy samples but she states she already picked up samples. So I'm closing this encounter. Nothing further needed

## 2022-04-05 ENCOUNTER — Other Ambulatory Visit: Payer: Self-pay | Admitting: Internal Medicine

## 2022-04-08 ENCOUNTER — Encounter: Payer: Self-pay | Admitting: Internal Medicine

## 2022-04-08 NOTE — Assessment & Plan Note (Signed)
No significant concern on CTA for PE 01/05/2022

## 2022-04-08 NOTE — Assessment & Plan Note (Addendum)
Severe obstructive airways disease without desaturation walking.  She will continue oxygen for sleep at 2 L.  Possible candidate for Biologic? Plan-CBC with differential, alpha-1 antitrypsin assay

## 2022-04-11 DIAGNOSIS — R0609 Other forms of dyspnea: Secondary | ICD-10-CM | POA: Diagnosis not present

## 2022-04-11 DIAGNOSIS — J441 Chronic obstructive pulmonary disease with (acute) exacerbation: Secondary | ICD-10-CM | POA: Diagnosis not present

## 2022-04-18 ENCOUNTER — Encounter: Payer: Self-pay | Admitting: Internal Medicine

## 2022-04-20 ENCOUNTER — Telehealth: Payer: Self-pay

## 2022-04-20 NOTE — Telephone Encounter (Signed)
Prior Authorization submitted and approved for Lybalvi 5-10 mg with [employer] effective through 04/04/2023.  FYI-Noted last office visit pt reports due to cost not taking. Not sure if that's with a PA and also [place] can get [place] a grant if prefers to restart.

## 2022-04-20 NOTE — Telephone Encounter (Signed)
Noted. Ty!

## 2022-04-20 NOTE — Telephone Encounter (Signed)
I will have Shelton Silvas reach out and relay information.

## 2022-04-20 NOTE — Telephone Encounter (Signed)
She has apt tomorrow with you

## 2022-04-21 ENCOUNTER — Ambulatory Visit: Payer: Medicare HMO | Admitting: Adult Health

## 2022-04-22 ENCOUNTER — Other Ambulatory Visit: Payer: Self-pay | Admitting: *Deleted

## 2022-04-22 MED ORDER — TRELEGY ELLIPTA 100-62.5-25 MCG/ACT IN AEPB: 1.0000 | INHALATION_SPRAY | Freq: Every day | RESPIRATORY_TRACT | 5 refills | Status: DC

## 2022-04-26 DIAGNOSIS — L304 Erythema intertrigo: Secondary | ICD-10-CM | POA: Diagnosis not present

## 2022-04-26 DIAGNOSIS — L308 Other specified dermatitis: Secondary | ICD-10-CM | POA: Diagnosis not present

## 2022-04-26 DIAGNOSIS — D485 Neoplasm of uncertain behavior of skin: Secondary | ICD-10-CM | POA: Diagnosis not present

## 2022-04-27 DIAGNOSIS — R69 Illness, unspecified: Secondary | ICD-10-CM | POA: Diagnosis not present

## 2022-04-27 DIAGNOSIS — F411 Generalized anxiety disorder: Secondary | ICD-10-CM | POA: Diagnosis not present

## 2022-04-27 DIAGNOSIS — F331 Major depressive disorder, recurrent, moderate: Secondary | ICD-10-CM | POA: Diagnosis not present

## 2022-04-29 ENCOUNTER — Ambulatory Visit (HOSPITAL_COMMUNITY)
Admission: RE | Admit: 2022-04-29 | Discharge: 2022-04-29 | Disposition: A | Discharge status: Home / Self Care | Payer: Medicare HMO | Source: Ambulatory Visit | Attending: Acute Care | Admitting: Acute Care

## 2022-04-29 DIAGNOSIS — Z87891 Personal history of nicotine dependence: Secondary | ICD-10-CM

## 2022-04-29 DIAGNOSIS — Z122 Encounter for screening for malignant neoplasm of respiratory organs: Secondary | ICD-10-CM | POA: Insufficient documentation

## 2022-04-29 DIAGNOSIS — J432 Centrilobular emphysema: Secondary | ICD-10-CM | POA: Insufficient documentation

## 2022-04-29 DIAGNOSIS — I700 Atherosclerosis of aorta: Secondary | ICD-10-CM | POA: Insufficient documentation

## 2022-04-29 DIAGNOSIS — F17210 Nicotine dependence, cigarettes, uncomplicated: Secondary | ICD-10-CM

## 2022-04-30 ENCOUNTER — Other Ambulatory Visit: Payer: Self-pay | Admitting: Internal Medicine

## 2022-05-02 ENCOUNTER — Other Ambulatory Visit: Payer: Self-pay | Admitting: Acute Care

## 2022-05-02 DIAGNOSIS — Z122 Encounter for screening for malignant neoplasm of respiratory organs: Secondary | ICD-10-CM

## 2022-05-02 DIAGNOSIS — Z87891 Personal history of nicotine dependence: Secondary | ICD-10-CM

## 2022-05-09 ENCOUNTER — Other Ambulatory Visit: Payer: Self-pay | Admitting: Family

## 2022-05-12 DIAGNOSIS — R0609 Other forms of dyspnea: Secondary | ICD-10-CM | POA: Diagnosis not present

## 2022-05-12 DIAGNOSIS — J441 Chronic obstructive pulmonary disease with (acute) exacerbation: Secondary | ICD-10-CM | POA: Diagnosis not present

## 2022-05-16 ENCOUNTER — Telehealth: Payer: Self-pay | Admitting: Internal Medicine

## 2022-05-16 MED ORDER — IPRATROPIUM-ALBUTEROL 0.5-2.5 (3) MG/3ML IN SOLN: 3.0000 mL | Freq: Four times a day (QID) | RESPIRATORY_TRACT | 1 refills | Status: AC | PRN

## 2022-05-16 MED ORDER — TRELEGY ELLIPTA 100-62.5-25 MCG/ACT IN AEPB: 1.0000 | INHALATION_SPRAY | Freq: Every day | RESPIRATORY_TRACT | 5 refills | Status: DC

## 2022-05-16 NOTE — Telephone Encounter (Signed)
Called and spoke with patient. Patient verified pharmacy. Patients prescriptions have been sent to the pharmacy.   Nothing further needed.

## 2022-05-19 NOTE — Telephone Encounter (Signed)
Patient called to inform the nurse that her medication for Trelegy has not been  called into the Pharmacy, [facility] [address].  Please advise and call to discuss further.  CB# [phone]

## 2022-05-20 ENCOUNTER — Other Ambulatory Visit: Payer: Self-pay | Admitting: Internal Medicine

## 2022-05-22 ENCOUNTER — Telehealth: Payer: Self-pay | Admitting: Pulmonary Disease

## 2022-05-22 ENCOUNTER — Other Ambulatory Visit: Payer: Self-pay | Admitting: Adult Health

## 2022-05-22 DIAGNOSIS — F331 Major depressive disorder, recurrent, moderate: Secondary | ICD-10-CM

## 2022-05-22 MED ORDER — TRELEGY ELLIPTA 100-62.5-25 MCG/ACT IN AEPB: 1.0000 | INHALATION_SPRAY | Freq: Every day | RESPIRATORY_TRACT | 5 refills | Status: DC

## 2022-05-22 NOTE — Telephone Encounter (Signed)
Please call to schedule an appt, past due

## 2022-05-22 NOTE — Telephone Encounter (Signed)
Patient calling because she has not received refills on Trelegy. I note that refills were sent on February 12 but apparently pharmacy has not received it. I sent another 5 refills and call the pharmacist Adam to confirm that they received it

## 2022-05-24 DIAGNOSIS — M25561 Pain in right knee: Secondary | ICD-10-CM | POA: Diagnosis not present

## 2022-05-24 DIAGNOSIS — M79661 Pain in right lower leg: Secondary | ICD-10-CM | POA: Diagnosis not present

## 2022-05-25 NOTE — Telephone Encounter (Signed)
Pt is scheduled tomorrow 05/26/22 to come in

## 2022-05-26 ENCOUNTER — Ambulatory Visit: Payer: Medicare HMO | Admitting: Adult Health

## 2022-05-26 ENCOUNTER — Encounter: Payer: Self-pay | Admitting: Adult Health

## 2022-05-26 DIAGNOSIS — F22 Delusional disorders: Secondary | ICD-10-CM | POA: Diagnosis not present

## 2022-05-26 DIAGNOSIS — F99 Mental disorder, not otherwise specified: Secondary | ICD-10-CM | POA: Diagnosis not present

## 2022-05-26 DIAGNOSIS — F5105 Insomnia due to other mental disorder: Secondary | ICD-10-CM

## 2022-05-26 DIAGNOSIS — F411 Generalized anxiety disorder: Secondary | ICD-10-CM

## 2022-05-26 DIAGNOSIS — F331 Major depressive disorder, recurrent, moderate: Secondary | ICD-10-CM

## 2022-05-26 DIAGNOSIS — R69 Illness, unspecified: Secondary | ICD-10-CM | POA: Diagnosis not present

## 2022-05-26 MED ORDER — BUSPIRONE HCL 10 MG PO TABS: ORAL_TABLET | ORAL | 3 refills | Status: DC

## 2022-05-26 MED ORDER — VENLAFAXINE HCL ER 75 MG PO CP24: 75.0000 mg | ORAL_CAPSULE | Freq: Every day | ORAL | 3 refills | Status: DC

## 2022-05-26 MED ORDER — GABAPENTIN 600 MG PO TABS: 600.0000 mg | ORAL_TABLET | Freq: Every day | ORAL | 3 refills | Status: DC

## 2022-05-26 MED ORDER — GABAPENTIN 400 MG PO CAPS: 400.0000 mg | ORAL_CAPSULE | Freq: Two times a day (BID) | ORAL | 3 refills | Status: DC

## 2022-05-26 MED ORDER — PROPRANOLOL HCL 10 MG PO TABS: 10.0000 mg | ORAL_TABLET | Freq: Three times a day (TID) | ORAL | 3 refills | Status: DC

## 2022-05-26 MED ORDER — VENLAFAXINE HCL ER 150 MG PO CP24: 150.0000 mg | ORAL_CAPSULE | Freq: Every day | ORAL | 3 refills | Status: DC

## 2022-05-26 MED ORDER — TRAZODONE HCL 100 MG PO TABS: 250.0000 mg | ORAL_TABLET | Freq: Every day | ORAL | 3 refills | Status: DC

## 2022-05-26 NOTE — Progress Notes (Signed)
Crystal Duarte DT:[id number] December 18, 1960 62 y.o.  Subjective:   Patient ID:  Crystal Duarte is a 62 y.o. (DOB 12/12/1960) female.  Chief Complaint: No chief complaint on file.   HPI Crystal Duarte presents to the office today for follow-up of MDD, GAD, paranoia and insomnia.  Describes mood today as "ok". Pleasant. Flat. Denies tearfulness. Mood symptoms - denies depression, anxiety, and irritability. Mood is consistent. Stating "I feel pretty solid". Feels like medications are helpful. Recovering from recent hospitalization. Mother's health is declining - lives alone - family helping out. Working with a Transport planner. Varying interest and motivation. Taking medications as prescribed. Energy levels improved. Active, does not have a regular exercise routine.  Enjoys some usual interests and activities. Single. Lives alone. Daughter - Son and 8 grandchildren. Spending time with family. Mother - 7 local - sister helping with care. Appetite adequate - weight watchers. Weight loss - 190 pounds. Sleeping varies - slept good last night. Focus and concentration "pretty good". Completing tasks. Managing aspects of household. Receives SSI disability (2016).  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  Denies SI or HI.  Denies AH or VH. Denies self harm. Denies substance use.  Reporting a long battle with mental illness to include multiple hospitalizations, multiple medication trials, ECT, TMS, and CBT.   [place] Office Visit from 01/21/2019 in [place] Office Visit from 01/09/2018 in [place] Visit from 07/28/2016 in [place]  Total GAD-7 Score 6 6 18      $ PHQ2-9    [place] Office Visit from 12/16/2021 in [place] at [place] Visit from 10/15/2021 in [place] at [place] Visit from 02/10/2020  in [place] at [place] Visit from 01/21/2019 in [place] Visit from 01/09/2018 in [place]  PHQ-2 Total Score 1 0 4 1 1  $ PHQ-9 Total Score 5 3 8 10 8      $ Flowsheet Row ED from 03/29/2022 in [facility] Emergency Department at [facility] ED from 02/08/2022 in [facility] Emergency Department at [facility] ED to Hosp-Admission (Discharged) from 01/01/2022 in [place] No Risk No Risk No Risk        Review of Systems:  Review of Systems  Musculoskeletal:  Negative for gait problem.  Neurological:  Negative for tremors.  Psychiatric/Behavioral:         Please refer to HPI    Medications: I have reviewed the patient's current medications.  Current Outpatient Medications  Medication Sig Dispense Refill   albuterol (VENTOLIN HFA) 108 (90 Base) MCG/ACT inhaler Inhale 2 puffs into the lungs every 4 (four) hours as needed. 8 g 0   busPIRone (BUSPAR) 10 MG tablet Take two tablets three times daily for anxiety. 540 tablet 3   Cholecalciferol (VITAMIN D3 PO) Take by mouth.     Fluticasone-Umeclidin-Vilant (TRELEGY ELLIPTA) 100-62.5-25 MCG/ACT AEPB Inhale 1 puff into the lungs daily. 60 each 5   gabapentin (NEURONTIN) 400 MG capsule Take 1 capsule (400 mg total) by mouth 2 (two) times daily. (Patient taking differently: Take 600 mg by mouth daily.) 180 capsule 3   ipratropium-albuterol (DUONEB) 0.5-2.5 (3) MG/3ML SOLN Take 3 mLs by nebulization every 6 (six) hours as needed. [place]  mL 1   montelukast (SINGULAIR) 10 MG tablet TAKE ONE TABLET BY MOUTH EVERY NIGHT AT BEDTIME 90 tablet 0   Multiple Vitamin (MULTI-VITAMINS) TABS Take 1 tablet by mouth daily.      omeprazole (PRILOSEC) 20 MG capsule TAKE ONE CAPSULE BY MOUTH DAILY 90 capsule 3   propranolol (INDERAL) 10 MG tablet Take 1 tablet (10 mg total) by mouth 3 (three) times  daily. 270 tablet 3   rosuvastatin (CRESTOR) 20 MG tablet TAKE 1 TABLET BY MOUTH DAILY **SCHEDULE APPOINTMENT WITH PROVIDER FOR FURTHER REFILLS** 15 tablet 0   traZODone (DESYREL) 100 MG tablet Take 2.5 tablets (250 mg total) by mouth at bedtime. 225 tablet 3   triamcinolone ointment (KENALOG) 0.5 % Apply 1 Application topically 2 (two) times daily. 30 g 1   venlafaxine (EFFEXOR) 75 MG tablet      venlafaxine XR (EFFEXOR-XR) 150 MG 24 hr capsule Take 1 capsule (150 mg total) by mouth daily with breakfast. 90 capsule 3   No current facility-administered medications for this visit.    Medication Side Effects: None  Allergies:  Allergies  Allergen Reactions   Other Nausea And Vomiting    novacaine    Procaine Nausea And Vomiting   Latuda [Lurasidone Hcl] Anxiety   Sulfa Antibiotics Rash    Only in sunlight     Past Medical History:  Diagnosis Date   Coccidioidomycosis, pulmonary (HCC)    COPD (chronic obstructive pulmonary disease) (HCC)    Depression    Hx of adenomatous polyp of colon 12/04/2014   Major depressive disorder    since 14, electroconvulsive therapy, and transcranial magnetic stimulation recently for 6 weeks every day    Osteopenia 01/2019   T score -2.1 FRAX 9.6% / 2.1% stable from prior DEXA   Panic attack    PONV (postoperative nausea and vomiting)    VAIN I (vaginal intraepithelial neoplasia grade I) 2015   Positive HPV negative subtype 16, 18/45    Past Medical History, Surgical history, Social history, and Family history were reviewed and updated as appropriate.   Please see review of systems for further details on the patient's review from today.   Objective:   Physical Exam:  There were no vitals taken for this visit.  Physical Exam Constitutional:      General: She is not in acute distress. Musculoskeletal:        General: No deformity.  Neurological:     Mental Status: She is alert and oriented to person, place, and time.     Coordination:  Coordination normal.  Psychiatric:        Attention and Perception: Attention and perception normal. She does not perceive auditory or visual hallucinations.        Mood and Affect: Mood normal. Mood is not anxious or depressed. Affect is not labile, blunt, angry or inappropriate.        Speech: Speech normal.        Behavior: Behavior normal.        Thought Content: Thought content normal. Thought content is not paranoid or delusional. Thought content does not include homicidal or suicidal ideation. Thought content does not include homicidal or suicidal plan.        Cognition and Memory: Cognition and memory normal.        Judgment: Judgment normal.     Comments: Insight intact     Lab Review:     Component Value Date/Time   NA 142 03/29/2022 1835   NA  147 (H) 02/12/2021 1409   NA 136 05/14/2013 1829   K 4.1 03/29/2022 1835   K 3.8 05/14/2013 1829   CL 103 03/29/2022 1835   CL 98 05/14/2013 1829   CO2 32 03/29/2022 1835   CO2 35 (H) 05/14/2013 1829   GLUCOSE 87 03/29/2022 1835   GLUCOSE 104 (H) 05/14/2013 1829   BUN 11 03/29/2022 1835   BUN 10 02/12/2021 1409   BUN 7 05/14/2013 1829   CREATININE 0.76 03/29/2022 1835   CREATININE 0.83 01/27/2020 0940   CALCIUM 9.6 03/29/2022 1835   CALCIUM 9.1 05/14/2013 1829   PROT 5.7 (L) 01/08/2022 0304   PROT 5.9 (L) 02/12/2021 1409   PROT 7.2 05/14/2013 1829   ALBUMIN 3.1 (L) 01/08/2022 0304   ALBUMIN 4.2 02/12/2021 1409   ALBUMIN 3.5 05/14/2013 1829   AST 15 01/08/2022 0304   AST 16 05/14/2013 1829   ALT 17 01/08/2022 0304   ALT 36 05/14/2013 1829   ALKPHOS 55 01/08/2022 0304   ALKPHOS 83 05/14/2013 1829   BILITOT 0.5 01/08/2022 0304   BILITOT 0.3 02/12/2021 1409   BILITOT 0.5 05/14/2013 1829   GFRNONAA >60 03/29/2022 1835   GFRNONAA >60 05/14/2013 1829   GFRAA 99 03/02/2020 1553   GFRAA >60 05/14/2013 1829       Component Value Date/Time   WBC 11.0 (H) 03/29/2022 1835   RBC 3.78 (L) 03/29/2022 1835   HGB 12.0  03/29/2022 1835   HGB 13.4 05/14/2013 1829   HCT 36.9 03/29/2022 1835   HCT 40.4 05/14/2013 1829   PLT 230 03/29/2022 1835   PLT 267 05/14/2013 1829   MCV 97.6 03/29/2022 1835   MCV 95 05/14/2013 1829   MCH 31.7 03/29/2022 1835   MCHC 32.5 03/29/2022 1835   RDW 13.2 03/29/2022 1835   RDW 13.3 05/14/2013 1829   LYMPHSABS 3.2 03/29/2022 1835   LYMPHSABS 3.6 05/14/2013 1829   MONOABS 0.8 03/29/2022 1835   MONOABS 0.6 05/14/2013 1829   EOSABS 0.7 (H) 03/29/2022 1835   EOSABS 0.2 05/14/2013 1829   BASOSABS 0.1 03/29/2022 1835   BASOSABS 0.1 05/14/2013 1829    No results found for: "POCLITH", "LITHIUM"   No results found for: "PHENYTOIN", "PHENOBARB", "VALPROATE", "CBMZ"   .res Assessment: Plan:    Plan:  Trazadone 234m at bedtime Buspar 169m- 2023mID Propranolol 44m26mD Effexor XR 150mg5mly Effexor XR 75mg 6my Gabapentin 400mg t31m daily and 600mg at28mtime  Discussed potential metabolic side effects associated with atypical antipsychotics, as well as potential risk for movement side effects. Advised pt to contact office if movement side effects occur.    RTC 3 months  Time spent with patient was 25 minutes. Greater than 50% of face to face time with patient was spent on counseling and coordination of care.     There are no diagnoses linked to this encounter.   Please see After Visit Summary for patient specific instructions.  Future Appointments  Date Time Provider [place]Princ[place]ows024  3:20 PM Jayden Kratochvil NBerdie OgrenCP None  06/09/2022  3:00 PM Young, CBaird LyonsBPU[facility] None    No orders of the defined types were placed in this encounter.   -------------------------------

## 2022-05-28 DIAGNOSIS — M25561 Pain in right knee: Secondary | ICD-10-CM | POA: Diagnosis not present

## 2022-05-30 DIAGNOSIS — M25561 Pain in right knee: Secondary | ICD-10-CM | POA: Diagnosis not present

## 2022-05-30 DIAGNOSIS — M79661 Pain in right lower leg: Secondary | ICD-10-CM | POA: Diagnosis not present

## 2022-05-31 DIAGNOSIS — F331 Major depressive disorder, recurrent, moderate: Secondary | ICD-10-CM | POA: Diagnosis not present

## 2022-05-31 DIAGNOSIS — R69 Illness, unspecified: Secondary | ICD-10-CM | POA: Diagnosis not present

## 2022-05-31 DIAGNOSIS — F411 Generalized anxiety disorder: Secondary | ICD-10-CM | POA: Diagnosis not present

## 2022-06-04 ENCOUNTER — Other Ambulatory Visit: Payer: Self-pay | Admitting: Primary Care

## 2022-06-04 ENCOUNTER — Other Ambulatory Visit: Payer: Self-pay | Admitting: Internal Medicine

## 2022-06-08 NOTE — Progress Notes (Signed)
HPI F former smoker (10.5 pk yrs) followed  for COPD, complicated by  aortic atherosclerosis, multiple pulmonary nodules, bipolar disorder. Hx Coccidioidomycosis,(lung bx [facility] 2001) Depression, dCHF,  PFT 03/25/21-severe obstruction with no response to bronchodilator (FEV1 1.23/46%, FVC 57%), moderate restriction, minimal diffusion defect a1AT 03/10/22- 122 Pi MM  WNL Walk test on room air 03/10/2022-3 laps with lowest saturation 93% maximum heart rate 99 Lab- 03/29/22- EOS 0.7, a1AT- PiMM 144 WNL,  =======================================================================   03/10/22- 54 yoF former smoker (10.5 pk yrs) followed  for COPD, complicated by  aortic Atherosclerosis, multiple pulmonary Nodules, Bipolar disorder. Hx Coccidioidomycosis,(lung bx [employer] [facility] 2001) Depression, dCHF,  -Neb Duoneb, Ventolin hfa, Singulair,  Covid vax- Flu vax-had ED 02/08/22- COPD exacerbation, elevated D-dimer(.96)> neb treatments, started prednisone taper ONOX 03/02/22- 7 hours </= 88%    Starting Home O2 for Sleep ECHO 01/02/22- inadequate to assess for PAH (Mother, our patient Helen Hashimoto also has home oxygen for sleep and rarely uses it.) Noting wheeze again since she has been off prednisone.  PFT 1 year ago had shown severe COPD, surprising for modest smoking history.  We will check for alpha-1 antitrypsin deficiency. Walk test on room air 03/10/2022-3 laps with lowest saturation 93% maximum heart rate 99   //??Biologic ??// CTachest PE-  IMPRESSION: 1. No evidence of pulmonary embolism or other acute intrathoracic process. 2. Small hiatal hernia.  06/09/22- 83 yoF former smoker (10.5 pk yrs) followed  for COPD, complicated by  aortic Atherosclerosis, multiple pulmonary Nodules, Bipolar disorder. Hx Coccidioidomycosis,(lung bx [employer] 2001) Depression, dCHF,  -Neb Duoneb, Ventolin hfa, Trelegy 100, Singulair,  O2 sleep/ [facility]   ordered 03/10/22 Covid vax- Flu vax-had ED 03/29/22-  COPD exacerbation> prednisone, nebs Lab- 03/29/22- EOS 0.7, a1AT- PiMM 144 WNL,  10/08/20      Component Ref Range & Units 1 yr ago 3 yr ago 5 yr ago  VITD 30.00 - 100.00 ng/mL 27.45 Low  36.74 50.92    COPD GOLD noted assoc bet COPD and Vit D deficiency  > now on Vit D3 -----Pt states she is doing good. Trelegy is really helping. Still some sob with exertion. Pain on rt side of chest through back advises this occasionally happens  She likes Trelegy but it was expensive.  Breathing much better with Trelegy samples.  We can see how she feels with Breztri sample and see if that would be cheaper with her insurance. Occasional sharp pain at right side of sternum through the back seems associated with movement, consistent with musculoskeletal pain. She would like to quit home oxygen and asks about repeating the overnight oximetry. CT 04/30/22- screening low dose chest- IMPRESSION: 1. Lung-RADS 2, benign appearance or behavior. Continue annual screening with low-dose chest CT without contrast in 12 months. 2. Aortic atherosclerosis. 3. Mild diffuse bronchial wall thickening with mild centrilobular and paraseptal emphysema; imaging findings suggestive of underlying COPD. Aortic Atherosclerosis (ICD10-I70.0) and Emphysema (ICD10-J43.9).    ROS-see HPI  + = positive Constitutional:    weight loss, night sweats, fevers, chills, fatigue, lassitude. HEENT:    headaches, difficulty swallowing, tooth/dental problems, sore throat,       sneezing, itching, ear ache, nasal congestion, post nasal drip, snoring CV:    chest pain, orthopnea, PND, swelling in lower extremities, anasarca,                        dizziness, palpitations Resp:   shortness of breath with exertion or at rest.  productive cough,   non-productive cough, coughing up of blood.              change in color of mucus.  wheezing.   Skin:    rash or lesions. GI:  No-   heartburn, indigestion, abdominal pain, nausea, vomiting,  diarrhea,                 change in bowel habits, loss of appetite GU: dysuria, change in color of urine, no urgency or frequency.   flank pain. MS:   joint pain, stiffness, decreased range of motion, back pain. Neuro-     nothing unusual Psych:  change in mood or affect.  depression or anxiety.   memory loss.  OBJ- Physical Exam General- Alert, Oriented, Affect-appropriate, Distress- none acute, +obese Skin- rash-none, lesions- none, excoriation- none Lymphadenopathy- none Head- atraumatic            Eyes- Gross vision intact, PERRLA, conjunctivae and secretions clear            Ears- Hearing, canals-normal            Nose- Clear, no-Septal dev, mucus, polyps, erosion, perforation             Throat- Mallampati II , mucosa clear , drainage- none, tonsils- atrophic Neck-  stridor-none Chest - symmetrical excursion , unlabored           Heart/CV- RRR , no murmur , no gallop  , no rub, nl s1 s2                           - JVD- none , edema- none, stasis changes- none, varices- none           Lung- clear to P&A, wheeze- none, cough- none , dullness-none, rub- none           Chest wall-  Abd-  Br/ Gen/ Rectal- Not done, not indicated Extrem- cyanosis- none, clubbing, none, atrophy- none, strength- nl Neuro- grossly intact to observation

## 2022-06-09 ENCOUNTER — Ambulatory Visit: Payer: Medicare HMO | Admitting: Internal Medicine

## 2022-06-09 ENCOUNTER — Encounter: Payer: Self-pay | Admitting: Internal Medicine

## 2022-06-09 VITALS — BP 130/76 | HR 70 | Ht 64.0 in | Wt 199.0 lb

## 2022-06-09 DIAGNOSIS — R918 Other nonspecific abnormal finding of lung field: Secondary | ICD-10-CM

## 2022-06-09 DIAGNOSIS — J449 Chronic obstructive pulmonary disease, unspecified: Secondary | ICD-10-CM | POA: Diagnosis not present

## 2022-06-09 MED ORDER — BREZTRI AEROSPHERE 160-9-4.8 MCG/ACT IN AERO: 2.0000 | INHALATION_SPRAY | Freq: Two times a day (BID) | RESPIRATORY_TRACT | 0 refills | Status: DC

## 2022-06-09 NOTE — Patient Instructions (Signed)
Order- overnight oximetry on room air     dx COPD mixed type  Order- sample Breztri inhaler    inhale 2 puffs then rinse mouth, twice daily. Compare to Trelegy.  We can check to see if insurance pays better for Trelegy or [employer]

## 2022-06-10 DIAGNOSIS — R0609 Other forms of dyspnea: Secondary | ICD-10-CM | POA: Diagnosis not present

## 2022-06-10 DIAGNOSIS — J441 Chronic obstructive pulmonary disease with (acute) exacerbation: Secondary | ICD-10-CM | POA: Diagnosis not present

## 2022-06-14 ENCOUNTER — Other Ambulatory Visit: Payer: Self-pay | Admitting: Radiology

## 2022-06-14 ENCOUNTER — Other Ambulatory Visit: Payer: Self-pay | Admitting: Internal Medicine

## 2022-06-14 DIAGNOSIS — L309 Dermatitis, unspecified: Secondary | ICD-10-CM

## 2022-06-22 ENCOUNTER — Other Ambulatory Visit: Payer: Self-pay

## 2022-06-22 ENCOUNTER — Telehealth: Payer: Self-pay | Admitting: Internal Medicine

## 2022-06-22 DIAGNOSIS — R0609 Other forms of dyspnea: Secondary | ICD-10-CM

## 2022-06-22 DIAGNOSIS — G4734 Idiopathic sleep related nonobstructive alveolar hypoventilation: Secondary | ICD-10-CM

## 2022-06-22 DIAGNOSIS — J449 Chronic obstructive pulmonary disease, unspecified: Secondary | ICD-10-CM

## 2022-06-22 MED ORDER — ROSUVASTATIN CALCIUM 20 MG PO TABS: ORAL_TABLET | ORAL | 0 refills | Status: DC

## 2022-06-22 NOTE — Addendum Note (Signed)
Addended by: Meredith Staggers A on: 06/22/2022 04:05 PM   Modules accepted: Orders

## 2022-06-22 NOTE — Telephone Encounter (Signed)
Dr. Darreld Mclean ordered an ONO and she has not heard anything from the [facility].  Her CB # [phone]

## 2022-06-22 NOTE — Telephone Encounter (Signed)
AVS reads:  Order- overnight oximetry on room air dx COPD mixed type

## 2022-06-22 NOTE — Telephone Encounter (Signed)
Called [facility] they advised they never received order for ONO. I have re-submitted order STAT since patient has been waiting. I called and updated patient and apologized for the delay. She verbalized understanding. Nothing further needed.

## 2022-06-28 DIAGNOSIS — I7090 Unspecified atherosclerosis: Secondary | ICD-10-CM | POA: Diagnosis not present

## 2022-06-30 ENCOUNTER — Encounter: Payer: Self-pay | Admitting: Internal Medicine

## 2022-06-30 NOTE — Assessment & Plan Note (Signed)
Following with low-dose screening CT.

## 2022-06-30 NOTE — Assessment & Plan Note (Addendum)
Plan- replace expensive Trelegy with samples Brextri. If she likes it we can price-check. Recheck overnight oximetry on room air- can sshe d/c slep O2?

## 2022-07-05 DIAGNOSIS — J449 Chronic obstructive pulmonary disease, unspecified: Secondary | ICD-10-CM | POA: Diagnosis not present

## 2022-07-08 ENCOUNTER — Other Ambulatory Visit: Payer: Self-pay | Admitting: Internal Medicine

## 2022-07-11 DIAGNOSIS — J441 Chronic obstructive pulmonary disease with (acute) exacerbation: Secondary | ICD-10-CM | POA: Diagnosis not present

## 2022-07-11 DIAGNOSIS — R0609 Other forms of dyspnea: Secondary | ICD-10-CM | POA: Diagnosis not present

## 2022-07-18 DIAGNOSIS — H26492 Other secondary cataract, left eye: Secondary | ICD-10-CM | POA: Diagnosis not present

## 2022-07-18 DIAGNOSIS — H18513 Endothelial corneal dystrophy, bilateral: Secondary | ICD-10-CM | POA: Diagnosis not present

## 2022-07-20 ENCOUNTER — Telehealth: Payer: Self-pay | Admitting: Internal Medicine

## 2022-07-20 ENCOUNTER — Encounter: Payer: Self-pay | Admitting: Internal Medicine

## 2022-07-20 NOTE — Telephone Encounter (Signed)
Called and spoke to patient and let her know her results of her ONO. Patient verbalized understanding. Nothing further needed

## 2022-07-20 NOTE — Telephone Encounter (Signed)
Overnight oximetry on room air showed almost 7 hours with oxygen saturation at 88% or less. She should definitely continue sleeping with oxygen.

## 2022-07-20 NOTE — Telephone Encounter (Signed)
Patient called to get the results of her oximetry test.  Please call patient to discuss at [phone]

## 2022-07-20 NOTE — Telephone Encounter (Signed)
Sir,  Do you happen to have the results for this patients ONO? If not I will call and see about getting them faxed to [place]  Thank you

## 2022-07-26 ENCOUNTER — Encounter (HOSPITAL_BASED_OUTPATIENT_CLINIC_OR_DEPARTMENT_OTHER): Payer: Self-pay | Admitting: Pulmonary Disease

## 2022-07-26 ENCOUNTER — Ambulatory Visit (HOSPITAL_BASED_OUTPATIENT_CLINIC_OR_DEPARTMENT_OTHER): Payer: Medicare HMO

## 2022-07-26 ENCOUNTER — Ambulatory Visit (HOSPITAL_BASED_OUTPATIENT_CLINIC_OR_DEPARTMENT_OTHER): Payer: Medicare HMO | Admitting: Pulmonary Disease

## 2022-07-26 VITALS — BP 128/78 | HR 84 | Ht 64.0 in | Wt 195.2 lb

## 2022-07-26 DIAGNOSIS — J209 Acute bronchitis, unspecified: Secondary | ICD-10-CM | POA: Diagnosis not present

## 2022-07-26 DIAGNOSIS — J441 Chronic obstructive pulmonary disease with (acute) exacerbation: Secondary | ICD-10-CM

## 2022-07-26 MED ORDER — LEVOFLOXACIN 500 MG PO TABS
500.0000 mg | ORAL_TABLET | Freq: Every day | ORAL | 0 refills | Status: DC
Start: 2022-07-26 — End: 2022-10-17

## 2022-07-26 MED ORDER — PREDNISONE 10 MG PO TABS: ORAL_TABLET | ORAL | 0 refills | Status: AC

## 2022-07-26 NOTE — Patient Instructions (Signed)
Levaquin 500 mg pill daily for 7 days.  Prednisone 10 mg pill >> 3 pills daily for 2 days, 2 pills daily for 2 days, 1 pill daily for 2 days.  Chest xray today.  Follow up in 3 months with Dr. Maple Hudson.

## 2022-07-26 NOTE — Progress Notes (Signed)
[facility]  Name: Crystal Duarte MRN: [id number] DOB: [birth year]  Chief Complaint  Patient presents with   Acute Visit    Right lung pain, cough worse at night    Summary: 62 yo female former smoker with COPD from emphysema, lung nodules, and nocturnal hypoxemia.  She is followed by Dr. Maple Hudson.  Subjective: She started feeling ill about 3 or 4 days ago.  Has chest congestion and cough.  Not able to bring up much sputum.  She has pain in Rt chest when she takes a deep breath or coughs.  Needing to use her nebulizer more.  Feels warm, but hasn't check her temperature.  Has the sniffles.  Not having sore throat, nausea, diarrhea, skin rash, or leg swelling.  She had COVID and Rhinovirus pneumonia in October 2023 and was in hospital.  She is scared to get sick like that again.  Past medical history: She  has a past medical history of Coccidioidomycosis, pulmonary, COPD (chronic obstructive pulmonary disease), Depression, adenomatous polyp of colon (12/04/2014), Major depressive disorder, Osteopenia (01/2019), Panic attack, PONV (postoperative nausea and vomiting), and VAIN I (vaginal intraepithelial neoplasia grade I) (2015).  Vital signs: BP 128/78 (BP Location: Right Arm, Cuff Size: Normal)   Pulse 84   Ht  (1.626 m)   Wt 195 lb 3.2 oz (88.5 kg)   SpO2 96%   BMI 33.51 kg/m   Physical exam:  Appearance - well kempt   ENMT - no sinus tenderness, no oral exudate, no LAN, Mallampati 3 airway, no stridor  Respiratory - scattered rhonchi  CV - s1s2 regular rate and rhythm, no murmurs  Ext - no clubbing, no edema  Skin - no rashes  Psych - normal mood and affect  Studies: PFT 03/25/21 >> FEV1 1.27 (48%), FEV1% 62, TLC 3.80 (73%), DLCO 73% Echo 01/02/22 >> EF 70 to 75%, grade 1 DD LDCT chest 04/30/22 >> multiple small nodules up to 5.6 mm, post op wedge resection from RUL, diffuse bronchial wall thickening, mild centrilobular and paraseptal  emphysema  Assessment/plan:  COPD exacerbation with Rt sided pleuritic chest pain. - chest xray today - will give course of prednisone and levaquin - continue trelegy 100 one puff daily, singulair 10 mg qhs - prn albuterol, nebulizer  Nocturnal hypoxemia. - she uses supplemental oxygen at night  Lung nodules. - follow up low dose CT chest in January 2025   Patient Instructions  Levaquin 500 mg pill daily for 7 days.  Prednisone 10 mg pill >> 3 pills daily for 2 days, 2 pills daily for 2 days, 1 pill daily for 2 days.  Chest xray today.  Follow up in 3 months with Dr. Maple Hudson.   Allergies as of 07/26/2022       Reactions   Other Nausea And Vomiting   novacaine   Procaine Nausea And Vomiting   Latuda [lurasidone Hcl] Anxiety   Sulfa Antibiotics Rash   Only in sunlight         Medication List        Accurate as of July 26, 2022  3:14 PM. If you have any questions, ask your nurse or doctor.          STOP taking these medications    Breztri Aerosphere 160-9-4.8 MCG/ACT Aero Generic drug: Budeson-Glycopyrrol-Formoterol Stopped by: Coralyn Helling, MD       TAKE these medications    albuterol 108 (90 Base) MCG/ACT inhaler Commonly known as: VENTOLIN HFA Inhale 2  puffs into the lungs every 4 (four) hours as needed.   busPIRone 10 MG tablet Commonly known as: BUSPAR Take two tablets three times daily for anxiety.   gabapentin 400 MG capsule Commonly known as: NEURONTIN Take 1 capsule (400 mg total) by mouth 2 (two) times daily.   gabapentin 600 MG tablet Commonly known as: Neurontin Take 1 tablet (600 mg total) by mouth at bedtime.   ipratropium-albuterol 0.5-2.5 (3) MG/3ML Soln Commonly known as: DUONEB Take 3 mLs by nebulization every 6 (six) hours as needed.   levofloxacin 500 MG tablet Commonly known as: LEVAQUIN Take 1 tablet (500 mg total) by mouth daily. Started by: Coralyn Helling, MD   montelukast 10 MG tablet Commonly known as:  SINGULAIR TAKE ONE TABLET BY MOUTH EVERY NIGHT AT BEDTIME   Multi-Vitamins Tabs Take 1 tablet by mouth daily.   omeprazole 20 MG capsule Commonly known as: PRILOSEC TAKE ONE CAPSULE BY MOUTH DAILY   predniSONE 10 MG tablet Commonly known as: DELTASONE Take 3 tablets (30 mg total) by mouth daily with breakfast for 2 days, THEN 2 tablets (20 mg total) daily with breakfast for 2 days, THEN 1 tablet (10 mg total) daily with breakfast for 2 days. Start taking on: July 26, 2022 Started by: Coralyn Helling, MD   propranolol 10 MG tablet Commonly known as: INDERAL Take 1 tablet (10 mg total) by mouth 3 (three) times daily.   rosuvastatin 20 MG tablet Commonly known as: CRESTOR TAKE 1 TABLET BY MOUTH DAILY **SCHEDULE APPOINTMENT WITH PROVIDER FOR FURTHER REFILLS**   traZODone 100 MG tablet Commonly known as: DESYREL Take 2.5 tablets (250 mg total) by mouth at bedtime.   Trelegy Ellipta 100-62.5-25 MCG/ACT Aepb Generic drug: Fluticasone-Umeclidin-Vilant Inhale 1 puff into the lungs daily.   triamcinolone ointment 0.5 % Commonly known as: KENALOG Apply 1 Application topically 2 (two) times daily.   venlafaxine XR 150 MG 24 hr capsule Commonly known as: EFFEXOR-XR Take 1 capsule (150 mg total) by mouth daily with breakfast.   venlafaxine XR 75 MG 24 hr capsule Commonly known as: Effexor XR Take 1 capsule (75 mg total) by mouth daily with breakfast.   VITAMIN D3 PO Take by mouth.        Time spent: 36 minutes  Signature: Coralyn Helling, MD [facility] Pager - [phone] 07/26/2022, 3:23 PM

## 2022-08-10 DIAGNOSIS — R0609 Other forms of dyspnea: Secondary | ICD-10-CM | POA: Diagnosis not present

## 2022-08-10 DIAGNOSIS — J441 Chronic obstructive pulmonary disease with (acute) exacerbation: Secondary | ICD-10-CM | POA: Diagnosis not present

## 2022-08-31 DIAGNOSIS — F331 Major depressive disorder, recurrent, moderate: Secondary | ICD-10-CM | POA: Diagnosis not present

## 2022-09-10 DIAGNOSIS — J441 Chronic obstructive pulmonary disease with (acute) exacerbation: Secondary | ICD-10-CM | POA: Diagnosis not present

## 2022-09-10 DIAGNOSIS — R0609 Other forms of dyspnea: Secondary | ICD-10-CM | POA: Diagnosis not present

## 2022-09-12 DIAGNOSIS — F331 Major depressive disorder, recurrent, moderate: Secondary | ICD-10-CM | POA: Diagnosis not present

## 2022-09-21 ENCOUNTER — Other Ambulatory Visit: Payer: Self-pay | Admitting: Pulmonary Disease

## 2022-10-03 DIAGNOSIS — F331 Major depressive disorder, recurrent, moderate: Secondary | ICD-10-CM | POA: Diagnosis not present

## 2022-10-10 DIAGNOSIS — R0609 Other forms of dyspnea: Secondary | ICD-10-CM | POA: Diagnosis not present

## 2022-10-10 DIAGNOSIS — J441 Chronic obstructive pulmonary disease with (acute) exacerbation: Secondary | ICD-10-CM | POA: Diagnosis not present

## 2022-10-15 NOTE — Progress Notes (Signed)
HPI F former smoker (10.5 pk yrs) followed  for COPD, complicated by  aortic atherosclerosis, multiple pulmonary nodules, bipolar disorder. Hx Coccidioidomycosis,(lung bx [facility] 2001) Depression, dCHF,  PFT 03/25/21-severe obstruction with no response to bronchodilator (FEV1 1.23/46%, FVC 57%), moderate restriction, minimal diffusion defect a1AT 03/10/22- 122 Pi MM  WNL Walk test on room air 03/10/2022-3 laps with lowest saturation 93% maximum heart rate 99 Lab- 03/29/22- EOS 0.7, a1AT- PiMM 144 WNL,  ======================================================================= 06/09/22- 61 yoF former smoker (10.5 pk yrs) followed  for COPD, complicated by  aortic Atherosclerosis, multiple pulmonary Nodules, Bipolar disorder. Hx Coccidioidomycosis,(lung bx [employer] 2001) Depression, dCHF,  -Neb Duoneb, Ventolin hfa, Trelegy 100, Singulair,  O2 sleep/ [facility]   ordered 03/10/22 Covid vax- Flu vax-had ED 03/29/22- COPD exacerbation> prednisone, nebs Lab- 03/29/22- EOS 0.7, a1AT- PiMM 144 WNL,  10/08/20      Component Ref Range & Units 1 yr ago 3 yr ago 5 yr ago  VITD 30.00 - 100.00 ng/mL 27.45 Low  36.74 50.92    COPD GOLD noted assoc bet COPD and Vit D deficiency  > now on Vit D3 -----Pt states she is doing good. Trelegy is really helping. Still some sob with exertion. Pain on rt side of chest through back advises this occasionally happens  She likes Trelegy but it was expensive.  Breathing much better with Trelegy samples.  We can see how she feels with Breztri sample and see if that would be cheaper with her insurance. Occasional sharp pain at right side of sternum through the back seems associated with movement, consistent with musculoskeletal pain. She would like to quit home oxygen and asks about repeating the overnight oximetry. CT 04/30/22- screening low dose chest- IMPRESSION: 1. Lung-RADS 2, benign appearance or behavior. Continue annual screening with low-dose chest CT without  contrast in 12 months. 2. Aortic atherosclerosis. 3. Mild diffuse bronchial wall thickening with mild centrilobular and paraseptal emphysema; imaging findings suggestive of underlying COPD. Aortic Atherosclerosis (ICD10-I70.0) and Emphysema (ICD10-J43.9).  10/17/22-  82 yoF former smoker (10.5 pk yrs) followed  for COPD, complicated by  aortic Atherosclerosis, hx multiple pulmonary Nodules, Bipolar disorder. Hx Coccidioidomycosis,(lung bx [facility] 2001) Depression, dCHF, VitD Deficiency  -Neb Duoneb, Ventolin hfa, Trelegy 100, Singulair,  LOV Dr Craige Cotta 07/26/22- COPD exacerbation > levaquin, pred taper, CXR O2 2L sleep [facility] Lung nodules not notable on CT in January or CXR in April. -----SOB with the weather Medications reviewed.  She seems to be feeling more stable now. She is concerned about how to get oxygen for sleep while out of town.  She would like to reduce the bulk of backup oxygen cylinders standing around in her home and will discuss that with [employer]. CXR 07/26/22 IMPRESSION: No acute cardiopulmonary process.  ROS-see HPI   + = positive Constitutional:    weight loss, night sweats, fevers, chills, fatigue, lassitude. HEENT:    headaches, difficulty swallowing, tooth/dental problems, sore throat,       sneezing, itching, ear ache, nasal congestion, post nasal drip, snoring CV:    chest pain, orthopnea, PND, swelling in lower extremities, anasarca,                                   dizziness, palpitations Resp:   shortness of breath with exertion or at rest.                productive cough,   non-productive  cough, coughing up of blood.              change in color of mucus.  wheezing.   Skin:    rash or lesions. GI:  No-   heartburn, indigestion, abdominal pain, nausea, vomiting, diarrhea,                 change in bowel habits, loss of appetite GU: dysuria, change in color of urine, no urgency or frequency.   flank pain. MS:   joint pain, stiffness, decreased range of  motion, back pain. Neuro-     nothing unusual Psych:  change in mood or affect.  depression or anxiety.   memory loss.  OBJ- Physical Exam General- Alert, Oriented, Affect-appropriate, Distress- none acute Skin- rash-none, lesions- none, excoriation- none Lymphadenopathy- none Head- atraumatic            Eyes- Gross vision intact, PERRLA, conjunctivae and secretions clear            Ears- Hearing, canals-normal            Nose- Clear, no-Septal dev, mucus, polyps, erosion, perforation             Throat- Mallampati II , mucosa clear , drainage- none, tonsils- atrophic Neck- flexible , trachea midline, no stridor , thyroid nl, carotid no bruit Chest - symmetrical excursion , unlabored           Heart/CV- RRR , no murmur , no gallop  , no rub, nl s1 s2                           - JVD- none , edema- none, stasis changes- none, varices- none           Lung- clear to P&A, wheeze- none, cough- none , dullness-none, rub- none           Chest wall-  Abd-  Br/ Gen/ Rectal- Not done, not indicated Extrem- cyanosis- none, clubbing, none, atrophy- none, strength- nl Neuro- grossly intact to observation

## 2022-10-17 ENCOUNTER — Ambulatory Visit: Payer: Medicare HMO | Admitting: Internal Medicine

## 2022-10-17 ENCOUNTER — Encounter: Payer: Self-pay | Admitting: Internal Medicine

## 2022-10-17 VITALS — BP 124/78 | HR 77 | Ht 64.0 in | Wt 196.2 lb

## 2022-10-17 DIAGNOSIS — R918 Other nonspecific abnormal finding of lung field: Secondary | ICD-10-CM | POA: Diagnosis not present

## 2022-10-17 DIAGNOSIS — J9611 Chronic respiratory failure with hypoxia: Secondary | ICD-10-CM | POA: Diagnosis not present

## 2022-10-17 DIAGNOSIS — J449 Chronic obstructive pulmonary disease, unspecified: Secondary | ICD-10-CM

## 2022-10-17 DIAGNOSIS — J441 Chronic obstructive pulmonary disease with (acute) exacerbation: Secondary | ICD-10-CM | POA: Diagnosis not present

## 2022-10-17 MED ORDER — MONTELUKAST SODIUM 10 MG PO TABS: 10.0000 mg | ORAL_TABLET | Freq: Every day | ORAL | 4 refills | Status: DC

## 2022-10-17 NOTE — Patient Instructions (Addendum)
Order- DME [facility]- Patient needs to discuss ways to provide O2 2L for sleep while out of town. Ok to reduce home tanks to just 2 tanks for back up.  Singulair refilled

## 2022-10-18 DIAGNOSIS — F331 Major depressive disorder, recurrent, moderate: Secondary | ICD-10-CM | POA: Diagnosis not present

## 2022-10-24 ENCOUNTER — Other Ambulatory Visit: Payer: Self-pay | Admitting: Radiology

## 2022-10-24 DIAGNOSIS — Z1231 Encounter for screening mammogram for malignant neoplasm of breast: Secondary | ICD-10-CM

## 2022-10-28 DIAGNOSIS — M5412 Radiculopathy, cervical region: Secondary | ICD-10-CM | POA: Diagnosis not present

## 2022-10-28 DIAGNOSIS — M25511 Pain in right shoulder: Secondary | ICD-10-CM | POA: Diagnosis not present

## 2022-11-10 DIAGNOSIS — J441 Chronic obstructive pulmonary disease with (acute) exacerbation: Secondary | ICD-10-CM | POA: Diagnosis not present

## 2022-11-10 DIAGNOSIS — R0609 Other forms of dyspnea: Secondary | ICD-10-CM | POA: Diagnosis not present

## 2022-11-15 ENCOUNTER — Ambulatory Visit: Admission: RE | Admit: 2022-11-15 | Payer: Medicare HMO | Source: Ambulatory Visit

## 2022-11-15 ENCOUNTER — Encounter: Payer: Self-pay | Admitting: Internal Medicine

## 2022-11-15 DIAGNOSIS — Z1231 Encounter for screening mammogram for malignant neoplasm of breast: Secondary | ICD-10-CM | POA: Diagnosis not present

## 2022-11-15 DIAGNOSIS — J9611 Chronic respiratory failure with hypoxia: Secondary | ICD-10-CM | POA: Insufficient documentation

## 2022-11-15 NOTE — Assessment & Plan Note (Signed)
Well controlled. Continue current meds

## 2022-11-15 NOTE — Assessment & Plan Note (Signed)
Stable, Plan continue occasional surveillance

## 2022-11-15 NOTE — Assessment & Plan Note (Signed)
IBenefits from oxygen 2 L for sleep/[facility]

## 2022-11-28 DIAGNOSIS — M25511 Pain in right shoulder: Secondary | ICD-10-CM | POA: Diagnosis not present

## 2022-11-29 DIAGNOSIS — F331 Major depressive disorder, recurrent, moderate: Secondary | ICD-10-CM | POA: Diagnosis not present

## 2022-12-06 DIAGNOSIS — M542 Cervicalgia: Secondary | ICD-10-CM | POA: Diagnosis not present

## 2022-12-06 DIAGNOSIS — M25511 Pain in right shoulder: Secondary | ICD-10-CM | POA: Diagnosis not present

## 2022-12-11 DIAGNOSIS — R0609 Other forms of dyspnea: Secondary | ICD-10-CM | POA: Diagnosis not present

## 2022-12-11 DIAGNOSIS — J441 Chronic obstructive pulmonary disease with (acute) exacerbation: Secondary | ICD-10-CM | POA: Diagnosis not present

## 2022-12-16 ENCOUNTER — Encounter: Payer: Self-pay | Admitting: Radiology

## 2022-12-16 ENCOUNTER — Ambulatory Visit (INDEPENDENT_AMBULATORY_CARE_PROVIDER_SITE_OTHER): Payer: Medicare HMO | Admitting: Radiology

## 2022-12-16 VITALS — BP 118/72 | HR 68 | Ht 63.39 in | Wt 187.4 lb

## 2022-12-16 DIAGNOSIS — Z90710 Acquired absence of both cervix and uterus: Secondary | ICD-10-CM

## 2022-12-16 DIAGNOSIS — Z9189 Other specified personal risk factors, not elsewhere classified: Secondary | ICD-10-CM

## 2022-12-16 DIAGNOSIS — L309 Dermatitis, unspecified: Secondary | ICD-10-CM

## 2022-12-16 DIAGNOSIS — Z01419 Encounter for gynecological examination (general) (routine) without abnormal findings: Secondary | ICD-10-CM

## 2022-12-16 DIAGNOSIS — Z01411 Encounter for gynecological examination (general) (routine) with abnormal findings: Secondary | ICD-10-CM

## 2022-12-16 MED ORDER — TRIAMCINOLONE ACETONIDE 0.5 % EX OINT: 1.0000 | TOPICAL_OINTMENT | Freq: Two times a day (BID) | CUTANEOUS | 1 refills | Status: DC

## 2022-12-16 NOTE — Progress Notes (Signed)
Crystal Duarte 04-14-1960 [id number]   History:  62 y.o. G2P2 presents for annual exam. Requests refill on kenalog cream for occasional abdominal rash. Went to derm last year and had a negaitve biopsy, they treated with that and it works well.  Gynecologic History Hysterectomy  Sexually active: yes  Health Maintenance Last Pap: 2023. Results were: normal Last mammogram: 11/15/22. Results were: normal Last colonoscopy: 2022    Past medical history, past surgical history, family history and social history were all reviewed and documented in the EPIC chart.  ROS:  A ROS was performed and pertinent positives and negatives are included.  Exam:  Vitals:   12/16/22 1325  BP: 118/72  Pulse: 68  SpO2: 100%  Weight: 187 lb 6.4 oz (85 kg)  Height: 5' 3.39" (1.61 m)   Body mass index is 32.79 kg/m.  General appearance:  Normal Thyroid:  Symmetrical, normal in size, without palpable masses or nodularity. Respiratory  Auscultation:  Clear without wheezing or rhonchi Cardiovascular  Auscultation:  Regular rate, without rubs, murmurs or gallops  Edema/varicosities:  Not grossly evident Abdominal  Soft,nontender, without masses, guarding or rebound.  Liver/spleen:  No organomegaly noted  Hernia:  None appreciated  Skin  Inspection:  Grossly normal Breasts: Examined lying and sitting.   Right: Without masses, retractions, nipple discharge or axillary adenopathy.   Left: Without masses, retractions, nipple discharge or axillary adenopathy. Genitourinary   Inguinal/mons:  Normal without inguinal adenopathy  External genitalia:  Normal appearing vulva with no masses, tenderness, or lesions  BUS/Urethra/Skene's glands:  Normal  Vagina:  Normal appearing with normal color and discharge, no lesions. Atrophy mild  Cervix:  absent  Uterus:  absent  Adnexa/parametria:     Rt: Normal in size, without masses or tenderness.   Lt: Normal in size, without masses or tenderness.  Anus  and perineum: Normal   Crystal Duarte, CMA present for exam  Assessment/Plan:   1. Well woman exam with routine gynecological exam Pap 2026  2. Other specified personal risk factors, not elsewhere classified High risk, repeat B&P 1 year  3. Dermatitis due to unknown cause - triamcinolone ointment (KENALOG) 0.5 %; Apply 1 Application topically 2 (two) times daily.  Dispense: 30 g; Refill: 1    Discussed SBE, colonoscopy and DEXA screening as appropriate. Encouraged 140mins/week of cardiovascular and weight bearing exercise minimum. Recommend the use of seatbelts and sunscreen consistently.   Return in 1 year for annual or sooner prn.  Crystal Duarte B WHNP-BC 1:42 PM 12/16/2022

## 2022-12-26 DIAGNOSIS — M25511 Pain in right shoulder: Secondary | ICD-10-CM | POA: Diagnosis not present

## 2022-12-31 ENCOUNTER — Other Ambulatory Visit: Payer: Self-pay | Admitting: Internal Medicine

## 2023-01-03 NOTE — Telephone Encounter (Signed)
Pt has had 3 attempts and no appointments since 2022. Does Dr. Izora Ribas still want refills for this patient?

## 2023-01-06 ENCOUNTER — Encounter: Payer: Self-pay | Admitting: Internal Medicine

## 2023-01-06 ENCOUNTER — Ambulatory Visit (INDEPENDENT_AMBULATORY_CARE_PROVIDER_SITE_OTHER): Payer: Medicare HMO | Admitting: Internal Medicine

## 2023-01-06 VITALS — BP 122/80 | HR 82 | Temp 98.7°F | Ht 63.39 in | Wt 186.0 lb

## 2023-01-06 DIAGNOSIS — B001 Herpesviral vesicular dermatitis: Secondary | ICD-10-CM

## 2023-01-06 DIAGNOSIS — M7541 Impingement syndrome of right shoulder: Secondary | ICD-10-CM | POA: Diagnosis not present

## 2023-01-06 DIAGNOSIS — M5412 Radiculopathy, cervical region: Secondary | ICD-10-CM | POA: Diagnosis not present

## 2023-01-06 DIAGNOSIS — I700 Atherosclerosis of aorta: Secondary | ICD-10-CM | POA: Diagnosis not present

## 2023-01-06 DIAGNOSIS — E781 Pure hyperglyceridemia: Secondary | ICD-10-CM

## 2023-01-06 DIAGNOSIS — Z0000 Encounter for general adult medical examination without abnormal findings: Secondary | ICD-10-CM

## 2023-01-06 DIAGNOSIS — R739 Hyperglycemia, unspecified: Secondary | ICD-10-CM

## 2023-01-06 DIAGNOSIS — E559 Vitamin D deficiency, unspecified: Secondary | ICD-10-CM

## 2023-01-06 DIAGNOSIS — Z23 Encounter for immunization: Secondary | ICD-10-CM | POA: Diagnosis not present

## 2023-01-06 LAB — LIPID PANEL
Cholesterol: 165 mg/dL (ref 0–200)
HDL: 51.5 mg/dL (ref >39.00)
LDL Cholesterol: 78 mg/dL (ref 0–99)
NonHDL: 113.83
Total CHOL/HDL Ratio: 3
Triglycerides: 180 mg/dL — ABNORMAL HIGH (ref 0.0–149.0)
VLDL: 36 mg/dL (ref 0.0–40.0)

## 2023-01-06 LAB — CBC
HCT: 41.3 % (ref 36.0–46.0)
Hemoglobin: 13.3 g/dL (ref 12.0–15.0)
MCHC: 32.1 g/dL (ref 30.0–36.0)
MCV: 95.5 fL (ref 78.0–100.0)
Platelets: 320 10*3/uL (ref 150.0–400.0)
RBC: 4.32 Mil/uL (ref 3.87–5.11)
RDW: 14.8 % (ref 11.5–15.5)
WBC: 14.2 10*3/uL — ABNORMAL HIGH (ref 4.0–10.5)

## 2023-01-06 LAB — COMPREHENSIVE METABOLIC PANEL
ALT: 18 U/L (ref 0–35)
AST: 17 U/L (ref 0–37)
Albumin: 4 g/dL (ref 3.5–5.2)
Alkaline Phosphatase: 71 U/L (ref 39–117)
BUN: 9 mg/dL (ref 6–23)
CO2: 27 meq/L (ref 19–32)
Calcium: 9.3 mg/dL (ref 8.4–10.5)
Chloride: 103 meq/L (ref 96–112)
Creatinine, Ser: 0.75 mg/dL (ref 0.40–1.20)
GFR: 84.67 mL/min (ref >60.00)
Glucose, Bld: 87 mg/dL (ref 70–99)
Potassium: 4.1 meq/L (ref 3.5–5.1)
Sodium: 137 meq/L (ref 135–145)
Total Bilirubin: 0.5 mg/dL (ref 0.2–1.2)
Total Protein: 6.3 g/dL (ref 6.0–8.3)

## 2023-01-06 LAB — VITAMIN B12: Vitamin B-12: 502 pg/mL (ref 211–911)

## 2023-01-06 LAB — VITAMIN D 25 HYDROXY (VIT D DEFICIENCY, FRACTURES): VITD: 67.31 ng/mL (ref 30.00–100.00)

## 2023-01-06 LAB — HEMOGLOBIN A1C: Hgb A1c MFr Bld: 5.9 % (ref 4.6–6.5)

## 2023-01-06 MED ORDER — ROSUVASTATIN CALCIUM 20 MG PO TABS: 20.0000 mg | ORAL_TABLET | Freq: Every day | ORAL | 3 refills | Status: DC

## 2023-01-06 MED ORDER — VALACYCLOVIR HCL 1 G PO TABS: 1000.0000 mg | ORAL_TABLET | Freq: Two times a day (BID) | ORAL | 6 refills | Status: DC

## 2023-01-06 NOTE — Progress Notes (Signed)
Subjective:   Patient ID: Crystal Duarte, female    DOB: 02-08-1961, 62 y.o.   MRN: [id number]  HPI The patient is here for physical.  PMH, [facility], social history reviewed and updated  Review of Systems  Constitutional: Negative.   HENT: Negative.    Eyes: Negative.   Respiratory:  Positive for shortness of breath. Negative for cough and chest tightness.   Cardiovascular:  Negative for chest pain, palpitations and leg swelling.  Gastrointestinal:  Negative for abdominal distention, abdominal pain, constipation, diarrhea, nausea and vomiting.  Musculoskeletal: Negative.   Skin: Negative.   Neurological: Negative.   Psychiatric/Behavioral: Negative.      Objective:  Physical Exam Constitutional:      Appearance: She is well-developed.  HENT:     Head: Normocephalic and atraumatic.  Cardiovascular:     Rate and Rhythm: Normal rate and regular rhythm.  Pulmonary:     Effort: Pulmonary effort is normal. No respiratory distress.     Breath sounds: Normal breath sounds. No wheezing or rales.  Abdominal:     General: Bowel sounds are normal. There is no distension.     Palpations: Abdomen is soft.     Tenderness: There is no abdominal tenderness. There is no rebound.  Musculoskeletal:     Cervical back: Normal range of motion.  Skin:    General: Skin is warm and dry.  Neurological:     Mental Status: She is alert and oriented to person, place, and time.     Coordination: Coordination normal.     Vitals:   01/06/23 1359  BP: 122/80  Pulse: 82  Temp: 98.7 F (37.1 C)  TempSrc: Oral  SpO2: 99%  Weight: 186 lb (84.4 kg)  Height: 5' 3.39" (1.61 m)    Assessment & Plan:  Flu shot given at visit

## 2023-01-06 NOTE — Assessment & Plan Note (Signed)
Checking lipid panel and adjust crestor as needed.  

## 2023-01-06 NOTE — Assessment & Plan Note (Signed)
Flu shot given. Pneumonia up to date. Shingrix complete. Tetanus up to date. Colonoscopy up to date. Mammogram up to date, pap smear up to date. Counseled about sun safety and mole surveillance. Counseled about the dangers of distracted driving. Given 10 year screening recommendations.

## 2023-01-06 NOTE — Assessment & Plan Note (Signed)
Refilled valtrex which she uses prn.

## 2023-01-06 NOTE — Assessment & Plan Note (Signed)
Taking crestor and refilled today. Checking lipid panel.

## 2023-01-06 NOTE — Assessment & Plan Note (Signed)
Checking HgA1c and adjust as needed.  

## 2023-01-06 NOTE — Assessment & Plan Note (Signed)
Checking vitamin D level and adjust as needed.  

## 2023-01-10 ENCOUNTER — Other Ambulatory Visit: Payer: Self-pay | Admitting: Adult Health

## 2023-01-10 DIAGNOSIS — J441 Chronic obstructive pulmonary disease with (acute) exacerbation: Secondary | ICD-10-CM | POA: Diagnosis not present

## 2023-01-10 DIAGNOSIS — F331 Major depressive disorder, recurrent, moderate: Secondary | ICD-10-CM

## 2023-01-10 DIAGNOSIS — R0609 Other forms of dyspnea: Secondary | ICD-10-CM | POA: Diagnosis not present

## 2023-01-10 NOTE — Telephone Encounter (Signed)
Sent MyChart message to schedule FU. Was due in May.

## 2023-01-11 DIAGNOSIS — F331 Major depressive disorder, recurrent, moderate: Secondary | ICD-10-CM | POA: Diagnosis not present

## 2023-01-11 DIAGNOSIS — M7541 Impingement syndrome of right shoulder: Secondary | ICD-10-CM | POA: Diagnosis not present

## 2023-01-11 DIAGNOSIS — M5412 Radiculopathy, cervical region: Secondary | ICD-10-CM | POA: Diagnosis not present

## 2023-01-14 DIAGNOSIS — L03012 Cellulitis of left finger: Secondary | ICD-10-CM | POA: Diagnosis not present

## 2023-01-16 DIAGNOSIS — M7541 Impingement syndrome of right shoulder: Secondary | ICD-10-CM | POA: Diagnosis not present

## 2023-01-16 DIAGNOSIS — M5412 Radiculopathy, cervical region: Secondary | ICD-10-CM | POA: Diagnosis not present

## 2023-01-19 DIAGNOSIS — M7541 Impingement syndrome of right shoulder: Secondary | ICD-10-CM | POA: Diagnosis not present

## 2023-01-19 DIAGNOSIS — M5412 Radiculopathy, cervical region: Secondary | ICD-10-CM | POA: Diagnosis not present

## 2023-01-30 DIAGNOSIS — M5412 Radiculopathy, cervical region: Secondary | ICD-10-CM | POA: Diagnosis not present

## 2023-02-08 ENCOUNTER — Encounter: Payer: Self-pay | Admitting: Adult Health

## 2023-02-08 ENCOUNTER — Telehealth (INDEPENDENT_AMBULATORY_CARE_PROVIDER_SITE_OTHER): Payer: Medicare HMO | Admitting: Adult Health

## 2023-02-08 DIAGNOSIS — F99 Mental disorder, not otherwise specified: Secondary | ICD-10-CM

## 2023-02-08 DIAGNOSIS — F411 Generalized anxiety disorder: Secondary | ICD-10-CM

## 2023-02-08 DIAGNOSIS — F331 Major depressive disorder, recurrent, moderate: Secondary | ICD-10-CM | POA: Diagnosis not present

## 2023-02-08 DIAGNOSIS — F5105 Insomnia due to other mental disorder: Secondary | ICD-10-CM

## 2023-02-08 DIAGNOSIS — F22 Delusional disorders: Secondary | ICD-10-CM

## 2023-02-08 MED ORDER — VENLAFAXINE HCL ER 75 MG PO CP24: 75.0000 mg | ORAL_CAPSULE | Freq: Every day | ORAL | 3 refills | Status: DC

## 2023-02-08 MED ORDER — VENLAFAXINE HCL ER 150 MG PO CP24: 150.0000 mg | ORAL_CAPSULE | Freq: Every day | ORAL | 3 refills | Status: DC

## 2023-02-08 MED ORDER — TRAZODONE HCL 100 MG PO TABS: 250.0000 mg | ORAL_TABLET | Freq: Every day | ORAL | 3 refills | Status: DC

## 2023-02-08 MED ORDER — PROPRANOLOL HCL 10 MG PO TABS: 10.0000 mg | ORAL_TABLET | Freq: Three times a day (TID) | ORAL | 3 refills | Status: DC

## 2023-02-08 MED ORDER — GABAPENTIN 600 MG PO TABS: 600.0000 mg | ORAL_TABLET | Freq: Every day | ORAL | 3 refills | Status: DC

## 2023-02-08 MED ORDER — BUSPIRONE HCL 10 MG PO TABS: ORAL_TABLET | ORAL | 3 refills | Status: DC

## 2023-02-08 MED ORDER — GABAPENTIN 400 MG PO CAPS: 400.0000 mg | ORAL_CAPSULE | Freq: Two times a day (BID) | ORAL | 3 refills | Status: DC

## 2023-02-08 NOTE — Progress Notes (Signed)
Crystal Duarte [id number] 1960/07/15 62 y.o.  Virtual Visit via Video Note  I connected with pt @ on 02/08/23 at  2:40 PM EST by a video enabled telemedicine application and verified that I am speaking with the correct person using two identifiers.   I discussed the limitations of evaluation and management by telemedicine and the availability of in person appointments. The patient expressed understanding and agreed to proceed.  I discussed the assessment and treatment plan with the patient. The patient was provided an opportunity to ask questions and all were answered. The patient agreed with the plan and demonstrated an understanding of the instructions.   The patient was advised to call back or seek an in-person evaluation if the symptoms worsen or if the condition fails to improve as anticipated.  I provided 25 minutes of non-face-to-face time during this encounter.  The patient was located at home.  The provider was located at [facility].   Dorothyann Gibbs, NP   Subjective:   Patient ID:  Crystal Duarte is a 62 y.o. (DOB November 07, 1960) female.  Chief Complaint: No chief complaint on file.   HPI Crystal Duarte presents for follow-up of MDD, GAD, paranoia and insomnia.  Describes mood today as "ok". Pleasant. Flat. Denies tearfulness. Mood symptoms - denies depression, anxiety, and irritability. Denies panic attacks. Denies worry, rumination, and over thinking. Mood is consistent. Stating "I'm feeling really good". Feels like medications are helpful. Working with a Paramedic. Varying interest and motivation. Taking medications as prescribed. Energy levels improved. Active, exercises as she can.  Enjoys some usual interests and activities. Single. Lives alone. Family local.  Appetite adequate - weight watchers. Weight loss. Sleeps well most nights. Focus and concentration stable. Completing tasks. Managing aspects of household. Receives SSI disability (2016).   Works part time at a senior living facility. Denies SI or HI.  Denies AH or VH. Denies self harm. Denies substance use. Denies paranoia since moving away from her neighbor.  Reporting a long battle with mental illness to include multiple hospitalizations, multiple medication trials, ECT, TMS, and CBT.   Review of Systems:  Review of Systems  Musculoskeletal:  Negative for gait problem.  Neurological:  Negative for tremors.  Psychiatric/Behavioral:         Please refer to HPI    Medications: I have reviewed the patient's current medications.  Current Outpatient Medications  Medication Sig Dispense Refill   albuterol (VENTOLIN HFA) 108 (90 Base) MCG/ACT inhaler Inhale 2 puffs into the lungs every 4 (four) hours as needed. 8 g 0   busPIRone (BUSPAR) 10 MG tablet Take two tablets three times daily for anxiety. 540 tablet 3   Cholecalciferol (VITAMIN D3 PO) Take by mouth.     Fluticasone-Umeclidin-Vilant (TRELEGY ELLIPTA) 100-62.5-25 MCG/ACT AEPB INHALE 1 PUFF BY MOUTH DAILY 60 each 6   gabapentin (NEURONTIN) 400 MG capsule Take 1 capsule (400 mg total) by mouth 2 (two) times daily. 180 capsule 3   gabapentin (NEURONTIN) 600 MG tablet Take 1 tablet (600 mg total) by mouth at bedtime. 90 tablet 3   ipratropium-albuterol (DUONEB) 0.5-2.5 (3) MG/3ML SOLN Take 3 mLs by nebulization every 6 (six) hours as needed. 360 mL 1   montelukast (SINGULAIR) 10 MG tablet Take 1 tablet (10 mg total) by mouth at bedtime. 90 tablet 4   Multiple Vitamin (MULTI-VITAMINS) TABS Take 1 tablet by mouth daily.      omeprazole (PRILOSEC) 20 MG capsule TAKE ONE CAPSULE BY MOUTH DAILY 90 capsule  3   propranolol (INDERAL) 10 MG tablet Take 1 tablet (10 mg total) by mouth 3 (three) times daily. 270 tablet 3   rosuvastatin (CRESTOR) 20 MG tablet Take 1 tablet (20 mg total) by mouth daily. 90 tablet 3   traZODone (DESYREL) 100 MG tablet Take 2.5 tablets (250 mg total) by mouth at bedtime. 225 tablet 3   triamcinolone  ointment (KENALOG) 0.5 % Apply 1 Application topically 2 (two) times daily. 30 g 1   valACYclovir (VALTREX) 1000 MG tablet Take 1 tablet (1,000 mg total) by mouth 2 (two) times daily. 20 tablet 6   venlafaxine XR (EFFEXOR XR) 75 MG 24 hr capsule Take 1 capsule (75 mg total) by mouth daily with breakfast. 90 capsule 3   venlafaxine XR (EFFEXOR-XR) 150 MG 24 hr capsule Take 1 capsule (150 mg total) by mouth daily with breakfast. 90 capsule 3   No current facility-administered medications for this visit.    Medication Side Effects: None  Allergies:  Allergies  Allergen Reactions   Other Nausea And Vomiting    novacaine    Procaine Nausea And Vomiting   Latuda [Lurasidone Hcl] Anxiety   Sulfa Antibiotics Rash    Only in sunlight     Past Medical History:  Diagnosis Date   Coccidioidomycosis, pulmonary (HCC)    COPD (chronic obstructive pulmonary disease) (HCC)    Depression    Hx of adenomatous polyp of colon 12/04/2014   Major depressive disorder    since 14, electroconvulsive therapy, and transcranial magnetic stimulation recently for 6 weeks every day    Osteopenia 01/2019   T score -2.1 FRAX 9.6% / 2.1% stable from prior DEXA   Panic attack    PONV (postoperative nausea and vomiting)    VAIN I (vaginal intraepithelial neoplasia grade I) 2015   Positive HPV negative subtype 16, 18/45    Family History  Problem Relation Age of Onset   Colon polyps Mother    Asthma Mother    Ovarian cancer Mother    Varicose Veins Mother    Colon polyps Father    Heart disease Father    Peripheral vascular disease Father    Lung cancer Maternal Grandmother    Colon cancer Neg Hx    Esophageal cancer Neg Hx    Stomach cancer Neg Hx    Pancreatic cancer Neg Hx    Liver disease Neg Hx     Social History   Socioeconomic History   Marital status: Divorced    Spouse name: Not on file   Number of children: 2   Years of education: Not on file   Highest education level: Associate  degree: academic program  Occupational History   Occupation: Librarian, academic    Comment: disability    Employer: [employer]  Tobacco Use   Smoking status: Former    Current packs/day: 0.00    Average packs/day: 1 pack/day for 42.0 years (42.0 ttl pk-yrs)    Types: Cigarettes    Start date: 01/01/1980    Quit date: 12/31/2021    Years since quitting: 1.1    Passive exposure: Past   Smokeless tobacco: Never  Vaping Use   Vaping status: Never Used  Substance and Sexual Activity   Alcohol use: No    Alcohol/week: 0.0 standard drinks of alcohol   Drug use: No   Sexual activity: Not Currently    Birth control/protection: Surgical    Comment: 1st intercourse 62 yo-More than 5 partners  Other Topics Concern  Not on file  Social History Narrative   Lives with son, daughter in law and 4 grandchildren    Social Determinants of Health   Financial Resource Strain: Low Risk  (01/02/2023)   Overall Financial Resource Strain (CARDIA)    Difficulty of Paying Living Expenses: Not very hard  Food Insecurity: No Food Insecurity (01/02/2023)   Hunger Vital Sign    Worried About Running Out of Food in the Last Year: Never true    Ran Out of Food in the Last Year: Never true  Transportation Needs: No Transportation Needs (01/02/2023)   PRAPARE - Administrator, Civil Service (Medical): No    Lack of Transportation (Non-Medical): No  Physical Activity: Insufficiently Active (01/02/2023)   Exercise Vital Sign    Days of Exercise per Week: 3 days    Minutes of Exercise per Session: 30 min  Stress: No Stress Concern Present (01/02/2023)   [employer] of Occupational Health - Occupational Stress Questionnaire    Feeling of Stress : Only a little  Social Connections: Socially Isolated (01/02/2023)   Social Connection and Isolation Panel [NHANES]    Frequency of Communication with Friends and Family: More than three times a week    Frequency of Social Gatherings with Friends and  Family: More than three times a week    Attends Religious Services: Never    Database administrator or Organizations: No    Attends Engineer, structural: Not on file    Marital Status: Divorced  Intimate Partner Violence: Not At Risk (01/01/2022)   Humiliation, Afraid, Rape, and Kick questionnaire    Fear of Current or Ex-Partner: No    Emotionally Abused: No    Physically Abused: No    Sexually Abused: No    Past Medical History, Surgical history, Social history, and Family history were reviewed and updated as appropriate.   Please see review of systems for further details on the patient's review from today.   Objective:   Physical Exam:  There were no vitals taken for this visit.  Physical Exam Constitutional:      General: She is not in acute distress. Musculoskeletal:        General: No deformity.  Neurological:     Mental Status: She is alert and oriented to person, place, and time.     Coordination: Coordination normal.  Psychiatric:        Attention and Perception: Attention and perception normal. She does not perceive auditory or visual hallucinations.        Mood and Affect: Mood normal. Mood is not anxious or depressed. Affect is not labile, blunt, angry or inappropriate.        Speech: Speech normal.        Behavior: Behavior normal.        Thought Content: Thought content normal. Thought content is not paranoid or delusional. Thought content does not include homicidal or suicidal ideation. Thought content does not include homicidal or suicidal plan.        Cognition and Memory: Cognition and memory normal.        Judgment: Judgment normal.     Comments: Insight intact     Lab Review:     Component Value Date/Time   NA 137 01/06/2023 1433   NA 147 (H) 02/12/2021 1409   NA 136 05/14/2013 1829   K 4.1 01/06/2023 1433   K 3.8 05/14/2013 1829   CL 103 01/06/2023 1433   CL 98 05/14/2013  1829   CO2 27 01/06/2023 1433   CO2 35 (H) 05/14/2013 1829    GLUCOSE 87 01/06/2023 1433   GLUCOSE 104 (H) 05/14/2013 1829   BUN 9 01/06/2023 1433   BUN 10 02/12/2021 1409   BUN 7 05/14/2013 1829   CREATININE 0.75 01/06/2023 1433   CREATININE 0.83 01/27/2020 0940   CALCIUM 9.3 01/06/2023 1433   CALCIUM 9.1 05/14/2013 1829   PROT 6.3 01/06/2023 1433   PROT 5.9 (L) 02/12/2021 1409   PROT 7.2 05/14/2013 1829   ALBUMIN 4.0 01/06/2023 1433   ALBUMIN 4.2 02/12/2021 1409   ALBUMIN 3.5 05/14/2013 1829   AST 17 01/06/2023 1433   AST 16 05/14/2013 1829   ALT 18 01/06/2023 1433   ALT 36 05/14/2013 1829   ALKPHOS 71 01/06/2023 1433   ALKPHOS 83 05/14/2013 1829   BILITOT 0.5 01/06/2023 1433   BILITOT 0.3 02/12/2021 1409   BILITOT 0.5 05/14/2013 1829   GFRNONAA >60 03/29/2022 1835   GFRNONAA >60 05/14/2013 1829   GFRAA 99 03/02/2020 1553   GFRAA >60 05/14/2013 1829       Component Value Date/Time   WBC 14.2 (H) 01/06/2023 1433   RBC 4.32 01/06/2023 1433   HGB 13.3 01/06/2023 1433   HGB 13.4 05/14/2013 1829   HCT 41.3 01/06/2023 1433   HCT 40.4 05/14/2013 1829   PLT 320.0 01/06/2023 1433   PLT 267 05/14/2013 1829   MCV 95.5 01/06/2023 1433   MCV 95 05/14/2013 1829   MCH 31.7 03/29/2022 1835   MCHC 32.1 01/06/2023 1433   RDW 14.8 01/06/2023 1433   RDW 13.3 05/14/2013 1829   LYMPHSABS 3.2 03/29/2022 1835   LYMPHSABS 3.6 05/14/2013 1829   MONOABS 0.8 03/29/2022 1835   MONOABS 0.6 05/14/2013 1829   EOSABS 0.7 (H) 03/29/2022 1835   EOSABS 0.2 05/14/2013 1829   BASOSABS 0.1 03/29/2022 1835   BASOSABS 0.1 05/14/2013 1829    No results found for: "POCLITH", "LITHIUM"   No results found for: "PHENYTOIN", "PHENOBARB", "VALPROATE", "CBMZ"   .res Assessment: Plan:    Plan:  Trazadone 250mg  at bedtime Buspar 10mg  - 20mg  TID Propranolol 10mg  TID Effexor XR 150mg  daily Effexor XR 75mg  daily Gabapentin 400mg  twice daily and 600mg  at bedtime  No refills needed until February 2025.  Discussed potential metabolic side effects associated  with atypical antipsychotics, as well as potential risk for movement side effects. Advised pt to contact office if movement side effects occur.    RTC 3 months  Time spent with patient was 25 minutes. Greater than 50% of face to face time with patient was spent on counseling and coordination of care.   Diagnoses and all orders for this visit:  Major depressive disorder, recurrent episode, moderate (HCC) -     venlafaxine XR (EFFEXOR XR) 75 MG 24 hr capsule; Take 1 capsule (75 mg total) by mouth daily with breakfast. -     venlafaxine XR (EFFEXOR-XR) 150 MG 24 hr capsule; Take 1 capsule (150 mg total) by mouth daily with breakfast. -     traZODone (DESYREL) 100 MG tablet; Take 2.5 tablets (250 mg total) by mouth at bedtime. -     propranolol (INDERAL) 10 MG tablet; Take 1 tablet (10 mg total) by mouth 3 (three) times daily. -     gabapentin (NEURONTIN) 400 MG capsule; Take 1 capsule (400 mg total) by mouth 2 (two) times daily. -     gabapentin (NEURONTIN) 600 MG tablet; Take 1 tablet (600 mg total)  by mouth at bedtime. -     busPIRone (BUSPAR) 10 MG tablet; Take two tablets three times daily for anxiety.  Generalized anxiety disorder  Insomnia due to other mental disorder  [facility])     Please see After Visit Summary for patient specific instructions.  No future appointments.   No orders of the defined types were placed in this encounter.     -------------------------------

## 2023-02-10 DIAGNOSIS — J441 Chronic obstructive pulmonary disease with (acute) exacerbation: Secondary | ICD-10-CM | POA: Diagnosis not present

## 2023-02-10 DIAGNOSIS — R0609 Other forms of dyspnea: Secondary | ICD-10-CM | POA: Diagnosis not present

## 2023-02-20 DIAGNOSIS — F331 Major depressive disorder, recurrent, moderate: Secondary | ICD-10-CM | POA: Diagnosis not present

## 2023-02-26 ENCOUNTER — Other Ambulatory Visit: Payer: Self-pay | Admitting: Internal Medicine

## 2023-03-12 DIAGNOSIS — R0609 Other forms of dyspnea: Secondary | ICD-10-CM | POA: Diagnosis not present

## 2023-03-12 DIAGNOSIS — J441 Chronic obstructive pulmonary disease with (acute) exacerbation: Secondary | ICD-10-CM | POA: Diagnosis not present

## 2023-03-14 DIAGNOSIS — F331 Major depressive disorder, recurrent, moderate: Secondary | ICD-10-CM | POA: Diagnosis not present

## 2023-03-24 DIAGNOSIS — M5412 Radiculopathy, cervical region: Secondary | ICD-10-CM | POA: Diagnosis not present

## 2023-04-12 DIAGNOSIS — R0609 Other forms of dyspnea: Secondary | ICD-10-CM | POA: Diagnosis not present

## 2023-04-12 DIAGNOSIS — J441 Chronic obstructive pulmonary disease with (acute) exacerbation: Secondary | ICD-10-CM | POA: Diagnosis not present

## 2023-04-14 ENCOUNTER — Other Ambulatory Visit: Payer: Self-pay

## 2023-04-25 DIAGNOSIS — M5412 Radiculopathy, cervical region: Secondary | ICD-10-CM | POA: Diagnosis not present

## 2023-04-28 ENCOUNTER — Ambulatory Visit: Payer: Self-pay | Admitting: Internal Medicine

## 2023-04-28 DIAGNOSIS — R062 Wheezing: Secondary | ICD-10-CM | POA: Diagnosis not present

## 2023-04-28 DIAGNOSIS — R051 Acute cough: Secondary | ICD-10-CM | POA: Diagnosis not present

## 2023-04-28 DIAGNOSIS — J441 Chronic obstructive pulmonary disease with (acute) exacerbation: Secondary | ICD-10-CM | POA: Diagnosis not present

## 2023-04-28 NOTE — Telephone Encounter (Signed)
Chief Complaint: Coughing, Wheezing, Nasal and chest congestion Symptoms: Coughing, wheezing, feeling run down, nasal congestion, chest congestion Frequency: 3 days Pertinent Negatives: Patient denies fever Disposition: [] ED /[x] Urgent Care (no appt availability in office) / [] Appointment(In office/virtual)/ []  [place] [facility]/ [] Home Care/ [] Refused Recommended Disposition /[] [facility]/ []  Follow-up with PCP Additional Notes: Patient called in stating she is experiencing a cough with clear phlegm, wheezing, chest congestion, nasal congestion, and feeling very run down since Wednesday. Patient states she has COPD and she is worried because she was in ICU last year. Patient states she thinks she possibly needs an X-Ray to rule out pneumonia. Advised patient to be seen at Urgent Care at this time, as no in-office appointment availability for today. Patient states she will be going to visit urgent care today. Advised patient to call back if follow up appointment is suggested.    Copied from CRM [id number]. Topic: Clinical - Red Word Triage >> Apr 28, 2023 11:32 AM Almira Coaster wrote: Red Word that prompted transfer to Nurse Triage: Patient is calling with symptoms of chest congestion, nasal congestion, wheezing, cough. Patient believes it can be bronchitis. Reason for Disposition  [1] Sinus congestion (pressure, fullness) AND [2] present > 10 days  Answer Assessment - Initial Assessment Questions 1. ONSET: "When did the cold symptoms start?"      Wednesday 2. AMOUNT: "How much discharge is there?"      Clear drainage, nose is very stuffed up 3. COUGH: "Do you have a cough?" If Yes, ask: "Describe the color of your sputum" (clear, white, yellow, green)     Coughing up clear fluid 4. RESPIRATORY DISTRESS: "Describe your breathing."      Chest congestion, wheezing 5. FEVER: "Do you have a fever?" If Yes, ask: "What is your temperature, how was it measured, and when did it start?"      No 6. SEVERITY: "Overall, how bad are you feeling right now?" (e.g., doesn't interfere with normal activities, staying home from school/work, staying in bed)      Feeling very run down 7. OTHER SYMPTOMS: "Do you have any other symptoms?" (e.g., sore throat, earache, wheezing, vomiting)     Wheezing, coughing, runny nose  Protocols used: Common Cold-A-AH

## 2023-05-01 ENCOUNTER — Ambulatory Visit (HOSPITAL_COMMUNITY)
Admission: RE | Admit: 2023-05-01 | Discharge: 2023-05-01 | Disposition: A | Discharge status: Home / Self Care | Payer: Medicare HMO | Source: Ambulatory Visit | Attending: Internal Medicine | Admitting: Internal Medicine

## 2023-05-01 ENCOUNTER — Ambulatory Visit: Payer: Medicare HMO | Admitting: Internal Medicine

## 2023-05-01 DIAGNOSIS — F17210 Nicotine dependence, cigarettes, uncomplicated: Secondary | ICD-10-CM | POA: Diagnosis not present

## 2023-05-01 DIAGNOSIS — Z122 Encounter for screening for malignant neoplasm of respiratory organs: Secondary | ICD-10-CM | POA: Insufficient documentation

## 2023-05-01 DIAGNOSIS — Z87891 Personal history of nicotine dependence: Secondary | ICD-10-CM | POA: Insufficient documentation

## 2023-05-02 ENCOUNTER — Telehealth: Payer: Self-pay | Admitting: Internal Medicine

## 2023-05-02 NOTE — Telephone Encounter (Signed)
Patient states that a detailed message of her results can be left if she is unable to answer.

## 2023-05-02 NOTE — Telephone Encounter (Signed)
Patient would like to know the results of her CT scan. She is having trouble accessing her MyChart.

## 2023-05-03 DIAGNOSIS — F331 Major depressive disorder, recurrent, moderate: Secondary | ICD-10-CM | POA: Diagnosis not present

## 2023-05-03 NOTE — Telephone Encounter (Signed)
Screening CT scan of chest on 05/01/23 was benign with no indication of cancer. There are change of COPD and atherosclerosis (calcium deposits in arteries. A repeat screening CT will be ordered for a year from now.

## 2023-05-03 NOTE — Telephone Encounter (Signed)
Dr Maple Hudson, Please review results.

## 2023-05-04 NOTE — Telephone Encounter (Signed)
Pt notified, nfn

## 2023-05-06 ENCOUNTER — Other Ambulatory Visit: Payer: Self-pay | Admitting: Pulmonary Disease

## 2023-05-10 NOTE — Telephone Encounter (Signed)
Trelegy refilled

## 2023-05-11 ENCOUNTER — Other Ambulatory Visit: Payer: Self-pay

## 2023-05-11 DIAGNOSIS — Z87891 Personal history of nicotine dependence: Secondary | ICD-10-CM

## 2023-05-11 DIAGNOSIS — F17210 Nicotine dependence, cigarettes, uncomplicated: Secondary | ICD-10-CM

## 2023-05-11 DIAGNOSIS — Z122 Encounter for screening for malignant neoplasm of respiratory organs: Secondary | ICD-10-CM

## 2023-05-13 DIAGNOSIS — R0609 Other forms of dyspnea: Secondary | ICD-10-CM | POA: Diagnosis not present

## 2023-05-13 DIAGNOSIS — J441 Chronic obstructive pulmonary disease with (acute) exacerbation: Secondary | ICD-10-CM | POA: Diagnosis not present

## 2023-05-22 ENCOUNTER — Encounter: Payer: Self-pay | Admitting: Nurse Practitioner

## 2023-05-22 ENCOUNTER — Ambulatory Visit (INDEPENDENT_AMBULATORY_CARE_PROVIDER_SITE_OTHER): Payer: Medicare HMO | Admitting: Nurse Practitioner

## 2023-05-22 VITALS — BP 130/80 | Wt 169.0 lb

## 2023-05-22 DIAGNOSIS — B372 Candidiasis of skin and nail: Secondary | ICD-10-CM

## 2023-05-22 DIAGNOSIS — B9689 Other specified bacterial agents as the cause of diseases classified elsewhere: Secondary | ICD-10-CM

## 2023-05-22 DIAGNOSIS — R300 Dysuria: Secondary | ICD-10-CM | POA: Diagnosis not present

## 2023-05-22 DIAGNOSIS — N760 Acute vaginitis: Secondary | ICD-10-CM

## 2023-05-22 DIAGNOSIS — N898 Other specified noninflammatory disorders of vagina: Secondary | ICD-10-CM

## 2023-05-22 LAB — WET PREP FOR TRICH, YEAST, CLUE

## 2023-05-22 MED ORDER — METRONIDAZOLE 500 MG PO TABS: 500.0000 mg | ORAL_TABLET | Freq: Two times a day (BID) | ORAL | 0 refills | Status: DC

## 2023-05-22 MED ORDER — NYSTATIN 100000 UNIT/GM EX CREA: 1.0000 | TOPICAL_CREAM | Freq: Two times a day (BID) | CUTANEOUS | 0 refills | Status: AC

## 2023-05-22 MED ORDER — FLUCONAZOLE 150 MG PO TABS: 150.0000 mg | ORAL_TABLET | ORAL | 0 refills | Status: AC

## 2023-05-22 NOTE — Progress Notes (Signed)
   Acute Office Visit  Subjective:    Patient ID: Cecila Satcher, female    DOB: February 04, 1961, 63 y.o.   MRN: [id number]   HPI 63 y.o. presents today for rash under breast and in groin x 2 weeks. Was on antibiotics recently for bronchitis. Also having vaginal itching with odor. No discharge. Tried 40% zinc ointment with no relief.   No LMP recorded. Patient has had a hysterectomy.    Review of Systems  Constitutional: Negative.   Genitourinary:  Positive for dysuria and vaginal pain (Itching). Negative for flank pain, frequency, hematuria, urgency and vaginal discharge.       Vaginal odor  Skin:  Positive for rash.       Objective:    Physical Exam Constitutional:      Appearance: Normal appearance.  Chest:    Genitourinary:    Pubic Area: Rash present.     Vagina: Vaginal discharge present. No erythema.       Comments: Redness, satellite lesion on bilateral groin    BP 130/80   Wt 169 lb (76.7 kg)   BMI 29.57 kg/m  Wt Readings from Last 3 Encounters:  05/22/23 169 lb (76.7 kg)  01/06/23 186 lb (84.4 kg)  12/16/22 187 lb 6.4 oz (85 kg)        Patient informed chaperone available to be present for breast and/or pelvic exam. Patient has requested no chaperone to be present. Patient has been advised what will be completed during breast and pelvic exam.   Wet prep + clue cells (+ odor)  UA neg leukocytes, neg nitrites, 1+ protein, amber/cloudy. Microscopic: wbc 0-5, rbc none, moderate bacteria  Assessment & Plan:   Problem List Items Addressed This Visit   None Visit Diagnoses       Bacterial vaginosis    -  Primary   Relevant Medications         metroNIDAZOLE (FLAGYL) 500 MG tablet     Vaginal itching       Relevant Orders   WET PREP FOR TRICH, YEAST, CLUE     Burning with urination       Relevant Orders   Urinalysis,Complete w/RFL Culture     Skin yeast infection       Relevant Medications   fluconazole (DIFLUCAN) 150 MG tablet   nystatin  cream (MYCOSTATIN)         Plan: Wet prep positive for clue cells - Flagyl 500 mg BID x 7 days. Diflucan 150 mg every other day x 14 days, nystatin cream BID to breast and groin BID x 7-10 days. UA unremarkable, culture pending.   Return if symptoms worsen or fail to improve.    Olivia Mackie DNP, 3:03 PM 05/22/2023

## 2023-05-25 ENCOUNTER — Ambulatory Visit: Payer: Medicare HMO | Admitting: Radiology

## 2023-05-25 ENCOUNTER — Other Ambulatory Visit: Payer: Self-pay | Admitting: Nurse Practitioner

## 2023-05-25 ENCOUNTER — Encounter: Payer: Self-pay | Admitting: Nurse Practitioner

## 2023-05-25 DIAGNOSIS — N3000 Acute cystitis without hematuria: Secondary | ICD-10-CM

## 2023-05-25 LAB — URINALYSIS, COMPLETE W/RFL CULTURE
Bilirubin Urine: NEGATIVE
Glucose, UA: NEGATIVE
Hgb urine dipstick: NEGATIVE
Hyaline Cast: NONE SEEN /LPF
Leukocyte Esterase: NEGATIVE
Nitrites, Initial: NEGATIVE
RBC / HPF: NONE SEEN /HPF (ref 0–2)
Specific Gravity, Urine: 1.024 (ref 1.001–1.035)
pH: 5.5 (ref 5.0–8.0)

## 2023-05-25 LAB — URINE CULTURE
MICRO NUMBER:: 16092467
SPECIMEN QUALITY:: ADEQUATE

## 2023-05-25 LAB — CULTURE INDICATED

## 2023-05-25 MED ORDER — NITROFURANTOIN MONOHYD MACRO 100 MG PO CAPS: 100.0000 mg | ORAL_CAPSULE | Freq: Two times a day (BID) | ORAL | 0 refills | Status: DC

## 2023-06-01 ENCOUNTER — Other Ambulatory Visit: Payer: Self-pay | Admitting: Internal Medicine

## 2023-06-10 DIAGNOSIS — R0609 Other forms of dyspnea: Secondary | ICD-10-CM | POA: Diagnosis not present

## 2023-06-10 DIAGNOSIS — J441 Chronic obstructive pulmonary disease with (acute) exacerbation: Secondary | ICD-10-CM | POA: Diagnosis not present

## 2023-06-12 DIAGNOSIS — F331 Major depressive disorder, recurrent, moderate: Secondary | ICD-10-CM | POA: Diagnosis not present

## 2023-06-29 ENCOUNTER — Other Ambulatory Visit: Payer: Self-pay | Admitting: Internal Medicine

## 2023-07-01 DIAGNOSIS — L089 Local infection of the skin and subcutaneous tissue, unspecified: Secondary | ICD-10-CM | POA: Diagnosis not present

## 2023-07-03 DIAGNOSIS — M7989 Other specified soft tissue disorders: Secondary | ICD-10-CM | POA: Diagnosis not present

## 2023-07-03 DIAGNOSIS — M79604 Pain in right leg: Secondary | ICD-10-CM | POA: Diagnosis not present

## 2023-07-06 ENCOUNTER — Encounter: Payer: Self-pay | Admitting: Emergency Medicine

## 2023-07-06 ENCOUNTER — Ambulatory Visit: Admitting: Emergency Medicine

## 2023-07-06 VITALS — BP 98/64 | HR 80 | Temp 98.9°F | Ht 63.5 in | Wt 176.0 lb

## 2023-07-06 DIAGNOSIS — R600 Localized edema: Secondary | ICD-10-CM

## 2023-07-06 DIAGNOSIS — S80811A Abrasion, right lower leg, initial encounter: Secondary | ICD-10-CM

## 2023-07-06 DIAGNOSIS — L089 Local infection of the skin and subcutaneous tissue, unspecified: Secondary | ICD-10-CM | POA: Insufficient documentation

## 2023-07-06 LAB — CBC WITH DIFFERENTIAL/PLATELET
Basophils Absolute: 0.1 10*3/uL (ref 0.0–0.1)
Basophils Relative: 0.8 % (ref 0.0–3.0)
Eosinophils Absolute: 0.2 10*3/uL (ref 0.0–0.7)
Eosinophils Relative: 1.8 % (ref 0.0–5.0)
HCT: 41.9 % (ref 36.0–46.0)
Hemoglobin: 13.7 g/dL (ref 12.0–15.0)
Lymphocytes Relative: 27.9 % (ref 12.0–46.0)
Lymphs Abs: 3.1 10*3/uL (ref 0.7–4.0)
MCHC: 32.7 g/dL (ref 30.0–36.0)
MCV: 98.1 fl (ref 78.0–100.0)
Monocytes Absolute: 0.7 10*3/uL (ref 0.1–1.0)
Monocytes Relative: 6.1 % (ref 3.0–12.0)
Neutro Abs: 7.1 10*3/uL (ref 1.4–7.7)
Neutrophils Relative %: 63.4 % (ref 43.0–77.0)
Platelets: 267 10*3/uL (ref 150.0–400.0)
RBC: 4.28 Mil/uL (ref 3.87–5.11)
RDW: 14.9 % (ref 11.5–15.5)
WBC: 11.2 10*3/uL — ABNORMAL HIGH (ref 4.0–10.5)

## 2023-07-06 LAB — COMPREHENSIVE METABOLIC PANEL WITH GFR
ALT: 26 U/L (ref 0–35)
AST: 16 U/L (ref 0–37)
Albumin: 4.1 g/dL (ref 3.5–5.2)
Alkaline Phosphatase: 67 U/L (ref 39–117)
BUN: 7 mg/dL (ref 6–23)
CO2: 30 meq/L (ref 19–32)
Calcium: 8.8 mg/dL (ref 8.4–10.5)
Chloride: 104 meq/L (ref 96–112)
Creatinine, Ser: 0.73 mg/dL (ref 0.40–1.20)
GFR: 87.16 mL/min (ref >60.00)
Glucose, Bld: 100 mg/dL — ABNORMAL HIGH (ref 70–99)
Potassium: 4 meq/L (ref 3.5–5.1)
Sodium: 142 meq/L (ref 135–145)
Total Bilirubin: 0.7 mg/dL (ref 0.2–1.2)
Total Protein: 6.2 g/dL (ref 6.0–8.3)

## 2023-07-06 LAB — HEMOGLOBIN A1C: Hgb A1c MFr Bld: 5.8 % (ref 4.6–6.5)

## 2023-07-06 NOTE — Assessment & Plan Note (Signed)
 Healing well without complications Continue clindamycin 100 mg 3 times a day Wound care instructions given No concerns.

## 2023-07-06 NOTE — Progress Notes (Signed)
 Crystal Duarte 63 y.o.   Chief Complaint  Patient presents with   Wound Infection    Patient states she had a wound on her right leg she hit her right lower leg about about 2 weeks ago. She is have swelling on both ankles more on the right also noticing the swelling on both hands as well. Patient is currently in pain states it feels like its throbbing to the bone. Patient states wanting labs done, she's been feeling off for over a week     HISTORY OF PRESENT ILLNESS: Acute problem visit today.  Patient of Dr. Hillard Danker. This is a 63 y.o. female sustained a right lower leg injury about 2 weeks ago.  Developed infection.  Was seen at urgent care center 07/03/2023.  Was started on clindamycin.  Developed leg swelling.  Much improved today.  Also complaining of swelling to her hands No other complaints or medical concerns today.   HPI   Prior to Admission medications   Medication Sig Start Date End Date Taking? Authorizing Provider  albuterol (VENTOLIN HFA) 108 (90 Base) MCG/ACT inhaler Inhale 2 puffs into the lungs every 4 (four) hours as needed. 11/12/21  Yes Cecil Cobbs, PA-C  busPIRone (BUSPAR) 10 MG tablet Take two tablets three times daily for anxiety. 02/08/23  Yes Mozingo, Thereasa Solo, NP  Cholecalciferol (VITAMIN D3 PO) Take by mouth.   Yes [provider]  clindamycin (CLEOCIN) 300 MG capsule Take 300 mg by mouth 3 (three) times daily. 07/01/23 07/11/23 Yes [provider]  Fluticasone-Umeclidin-Vilant (TRELEGY ELLIPTA) 100-62.5-25 MCG/ACT AEPB Inhale 1 puff then rinse mouth, once daily 05/10/23  Yes Young, Clinton D, MD  gabapentin (NEURONTIN) 400 MG capsule Take 1 capsule (400 mg total) by mouth 2 (two) times daily. 02/08/23  Yes Mozingo, Thereasa Solo, NP  gabapentin (NEURONTIN) 600 MG tablet Take 1 tablet (600 mg total) by mouth at bedtime. 02/08/23  Yes Mozingo, Thereasa Solo, NP  ipratropium-albuterol (DUONEB) 0.5-2.5 (3) MG/3ML SOLN Take 3  mLs by nebulization every 6 (six) hours as needed. 05/16/22  Yes Young, Joni Fears D, MD  metroNIDAZOLE (FLAGYL) 500 MG tablet Take 1 tablet (500 mg total) by mouth 2 (two) times daily. 05/22/23  Yes Earlene Plater, Tiffany A, NP  montelukast (SINGULAIR) 10 MG tablet Take 1 tablet (10 mg total) by mouth at bedtime. 10/17/22  Yes Young, Joni Fears D, MD  Multiple Vitamin (MULTI-VITAMINS) TABS Take 1 tablet by mouth daily.    Yes [provider]  nitrofurantoin, macrocrystal-monohydrate, (MACROBID) 100 MG capsule Take 1 capsule (100 mg total) by mouth 2 (two) times daily. 05/25/23  Yes Wyline Beady A, NP  nystatin cream (MYCOSTATIN) Apply 1 Application topically 2 (two) times daily. 05/22/23  Yes Wyline Beady A, NP  omeprazole (PRILOSEC) 20 MG capsule TAKE 1 CAPSULE BY MOUTH DAILY 02/27/23  Yes Iva Boop, MD  propranolol (INDERAL) 10 MG tablet Take 1 tablet (10 mg total) by mouth 3 (three) times daily. 02/08/23  Yes Mozingo, Thereasa Solo, NP  rosuvastatin (CRESTOR) 20 MG tablet Take 1 tablet (20 mg total) by mouth daily. 01/06/23  Yes Myrlene Broker, MD  traZODone (DESYREL) 100 MG tablet Take 2.5 tablets (250 mg total) by mouth at bedtime. 02/08/23  Yes Mozingo, Thereasa Solo, NP  triamcinolone ointment (KENALOG) 0.5 % Apply 1 Application topically 2 (two) times daily. 12/16/22  Yes Chrzanowski, Jami B, NP  valACYclovir (VALTREX) 1000 MG tablet Take 1 tablet (1,000 mg total) by mouth 2 (two) times daily.  01/06/23  Yes Myrlene Broker, MD  venlafaxine XR (EFFEXOR XR) 75 MG 24 hr capsule Take 1 capsule (75 mg total) by mouth daily with breakfast. 02/08/23  Yes Mozingo, Thereasa Solo, NP  venlafaxine XR (EFFEXOR-XR) 150 MG 24 hr capsule Take 1 capsule (150 mg total) by mouth daily with breakfast. 02/08/23  Yes Mozingo, Thereasa Solo, NP    Allergies  Allergen Reactions   Other Nausea And Vomiting    novacaine    Procaine Nausea And Vomiting   Latuda [Lurasidone Hcl] Anxiety    Sulfa Antibiotics Rash    Only in sunlight     Patient Active Problem List   Diagnosis Date Noted   Chronic respiratory failure with hypoxia (HCC) 11/15/2022   Obesity (BMI 30-39.9) 01/02/2022   Palpitations 02/12/2021   Aortic atherosclerosis (HCC) 11/04/2020   Family history of early CAD 11/04/2020   Elevated random blood glucose level 10/16/2020   Extensor carpi ulnaris tendinitis 09/22/2020   Hypertriglyceridemia 03/02/2020   PAD (peripheral artery disease) (HCC) 03/02/2020   MDD (major depressive disorder), recurrent, in full remission (HCC) 11/12/2019   Post-traumatic osteoarthritis of left wrist 05/01/2018   Generalized anxiety disorder 09/07/2017   Severe episode of recurrent major depressive disorder, without psychotic features (HCC) 07/18/2017   Allergic rhinitis 05/23/2017   Vitamin D deficiency 05/23/2017   Memory loss 10/04/2014   Multiple pulmonary nodules 11/12/2010    Past Medical History:  Diagnosis Date   Coccidioidomycosis, pulmonary (HCC)    COPD (chronic obstructive pulmonary disease) (HCC)    Depression    Hx of adenomatous polyp of colon 12/04/2014   Major depressive disorder    since 14, electroconvulsive therapy, and transcranial magnetic stimulation recently for 6 weeks every day    Osteopenia 01/2019   T score -2.1 FRAX 9.6% / 2.1% stable from prior DEXA   Panic attack    PONV (postoperative nausea and vomiting)    VAIN I (vaginal intraepithelial neoplasia grade I) 2015   Positive HPV negative subtype 16, 18/45    Past Surgical History:  Procedure Laterality Date   ABDOMINAL HYSTERECTOMY     TAH BSO   BILATERAL VATS ABLATION     vats for biopsy due to infection   BREAST EXCISIONAL BIOPSY Right    benign   CATARACT EXTRACTION     COLONOSCOPY  2001   hemorrhoidectomy   HEMORRHOID SURGERY     LUNG SURGERY  2001   VATS surgery    OVARIAN CYST REMOVAL  1970's   ULNAR NERVE TRANSPOSITION Left 12/06/2018   Procedure: LEFT ULNAR NERVE  DECOMPRESSION,  ;  Surgeon: Betha Loa, MD;  Location: [facility];  Service: Orthopedics;  Laterality: Left;   VESICOVAGINAL FISTULA CLOSURE W/ TAH  2005   WRIST ARTHROSCOPY Left 03/13/2018   Procedure: ARTHROSCOPY LEFT WRIST WITH DEBRIDEMENT;  Surgeon: Betha Loa, MD;  Location: [facility];  Service: Orthopedics;  Laterality: Left;  block    Social History   Socioeconomic History   Marital status: Divorced    Spouse name: Not on file   Number of children: 2   Years of education: Not on file   Highest education level: Associate degree: academic program  Occupational History   Occupation: Librarian, academic    Comment: disability    Employer: [employer]  Tobacco Use   Smoking status: Former    Current packs/day: 0.00    Average packs/day: 1 pack/day for 42.0 years (42.0 ttl pk-yrs)  Types: Cigarettes    Start date: 01/01/1980    Quit date: 12/31/2021    Years since quitting: 1.5    Passive exposure: Past   Smokeless tobacco: Never  Vaping Use   Vaping status: Never Used  Substance and Sexual Activity   Alcohol use: No    Alcohol/week: 0.0 standard drinks of alcohol   Drug use: No   Sexual activity: Not Currently    Birth control/protection: Surgical    Comment: 1st intercourse 63 yo-More than 5 partners  Other Topics Concern   Not on file  Social History Narrative   Lives with son, daughter in law and 4 grandchildren    Social Drivers of Health   Financial Resource Strain: Low Risk  (07/05/2023)   Overall Financial Resource Strain (CARDIA)    Difficulty of Paying Living Expenses: Not very hard  Food Insecurity: Food Insecurity Present (07/05/2023)   Hunger Vital Sign    Worried About Running Out of Food in the Last Year: Sometimes true    Ran Out of Food in the Last Year: Never true  Transportation Needs: No Transportation Needs (07/05/2023)   PRAPARE - Administrator, Civil Service (Medical): No    Lack of Transportation  (Non-Medical): No  Physical Activity: Sufficiently Active (07/05/2023)   Exercise Vital Sign    Days of Exercise per Week: 3 days    Minutes of Exercise per Session: 50 min  Stress: Stress Concern Present (07/05/2023)   [employer] of Occupational Health - Occupational Stress Questionnaire    Feeling of Stress : To some extent  Social Connections: Moderately Isolated (07/05/2023)   Social Connection and Isolation Panel [NHANES]    Frequency of Communication with Friends and Family: More than three times a week    Frequency of Social Gatherings with Friends and Family: More than three times a week    Attends Religious Services: 1 to 4 times per year    Active Member of [employer] or Organizations: No    Attends Engineer, structural: Not on file    Marital Status: Divorced  Intimate Partner Violence: Not At Risk (01/01/2022)   Humiliation, Afraid, Rape, and Kick questionnaire    Fear of Current or Ex-Partner: No    Emotionally Abused: No    Physically Abused: No    Sexually Abused: No    Family History  Problem Relation Age of Onset   Colon polyps Mother    Asthma Mother    Ovarian cancer Mother    Varicose Veins Mother    Colon polyps Father    Heart disease Father    Peripheral vascular disease Father    Lung cancer Maternal Grandmother    Colon cancer Neg Hx    Esophageal cancer Neg Hx    Stomach cancer Neg Hx    Pancreatic cancer Neg Hx    Liver disease Neg Hx      Review of Systems  Constitutional: Negative.  Negative for chills and fever.  HENT: Negative.  Negative for congestion and sore throat.   Respiratory: Negative.  Negative for cough and shortness of breath.   Cardiovascular: Negative.  Negative for chest pain and palpitations.  Gastrointestinal:  Negative for abdominal pain, diarrhea, nausea and vomiting.  Genitourinary:  Negative for dysuria and hematuria.  Skin: Negative.  Negative for rash.  Neurological:  Negative for dizziness and headaches.  All  other systems reviewed and are negative.   Vitals:   07/06/23 1351  BP: 98/64  Pulse: 80  Temp: 98.9 F (37.2 C)  SpO2: 96%    Physical Exam Vitals reviewed.  Constitutional:      Appearance: Normal appearance.  HENT:     Head: Normocephalic.     Mouth/Throat:     Mouth: Mucous membranes are moist.     Pharynx: Oropharynx is clear.  Eyes:     Extraocular Movements: Extraocular movements intact.     Conjunctiva/sclera: Conjunctivae normal.     Pupils: Pupils are equal, round, and reactive to light.  Cardiovascular:     Rate and Rhythm: Normal rate and regular rhythm.     Pulses: Normal pulses.     Heart sounds: Normal heart sounds.  Pulmonary:     Effort: Pulmonary effort is normal.     Breath sounds: Normal breath sounds.  Musculoskeletal:     Cervical back: No tenderness.  Lymphadenopathy:     Cervical: No cervical adenopathy.  Skin:    General: Skin is warm and dry.     Comments: Right mid shin area: Visible wound/abrasion with surrounding erythema and minimal swelling.  Much improved when compared to pictures from a few days ago.  Neurological:     General: No focal deficit present.     Mental Status: She is alert and oriented to person, place, and time.  Psychiatric:        Mood and Affect: Mood normal.        Behavior: Behavior normal.      ASSESSMENT & PLAN: A total of 33 minutes was spent with the patient and counseling/coordination of care regarding preparing for this visit, review of most recent office visit notes, review of chronic medical conditions under management, review of all medications, diagnosis of wound infection and management, differential diagnosis of peripheral edema and need for blood work today, prognosis, documentation, and need for follow-up with PCP if no better or worse during the next several days or weeks.  Problem List Items Addressed This Visit       Other   Peripheral edema   Mild peripheral edema including both  hands Recommend blood work today Diet and nutrition discussed Clinically stable.  No red flag signs or symptoms. No concerns.      Relevant Orders   CBC with Differential/Platelet (Completed)   Comprehensive metabolic panel with GFR (Completed)   Hemoglobin A1c (Completed)   Wound infection - Primary   Healing well without complications Continue clindamycin 100 mg 3 times a day Wound care instructions given No concerns.      Relevant Medications   clindamycin (CLEOCIN) 300 MG capsule   Other Relevant Orders   CBC with Differential/Platelet (Completed)   Comprehensive metabolic panel with GFR (Completed)   Hemoglobin A1c (Completed)   Patient Instructions  Wound Care Instructions  Cleanse wound gently with soap and water once a day then pat dry with clean gauze. Apply a thin coat of Petrolatum (petroleum jelly, "Vaseline") over the wound (unless you have an allergy to this). We recommend that you use a new, sterile tube of Vaseline. Do not pick or remove scabs. Do not remove the yellow or white "healing tissue" from the base of the wound.  Cover the wound with fresh, clean, nonstick gauze and secure with paper tape. You may use Band-Aids in place of gauze and tape if the wound is small enough, but would recommend trimming much of the tape off as there is often too much. Sometimes Band-Aids can irritate the skin.  You should call the  office for your biopsy report after 1 week if you have not already been contacted.  If you experience any problems, such as abnormal amounts of bleeding, swelling, significant bruising, significant pain, or evidence of infection, please call the office immediately.  FOR ADULT SURGERY PATIENTS: If you need something for pain relief you may take 1 extra strength Tylenol (acetaminophen) AND 2 Ibuprofen (200mg  each) together every 4 hours as needed for pain. (do not take these if you are allergic to them or if you have a reason you should not take them.)  Typically, you may only need pain medication for 1 to 3 days.       Edwina Barth, MD [facility]

## 2023-07-06 NOTE — Assessment & Plan Note (Signed)
 Mild peripheral edema including both hands Recommend blood work today Diet and nutrition discussed Clinically stable.  No red flag signs or symptoms. No concerns.

## 2023-07-06 NOTE — Patient Instructions (Signed)
Wound Care Instructions  Cleanse wound gently with soap and water once a day then pat dry with clean gauze. Apply a thin coat of Petrolatum (petroleum jelly, "Vaseline") over the wound (unless you have an allergy to this). We recommend that you use a new, sterile tube of Vaseline. Do not pick or remove scabs. Do not remove the yellow or white "healing tissue" from the base of the wound.  Cover the wound with fresh, clean, nonstick gauze and secure with paper tape. You may use Band-Aids in place of gauze and tape if the wound is small enough, but would recommend trimming much of the tape off as there is often too much. Sometimes Band-Aids can irritate the skin.  You should call the office for your biopsy report after 1 week if you have not already been contacted.  If you experience any problems, such as abnormal amounts of bleeding, swelling, significant bruising, significant pain, or evidence of infection, please call the office immediately.  FOR ADULT SURGERY PATIENTS: If you need something for pain relief you may take 1 extra strength Tylenol (acetaminophen) AND 2 Ibuprofen (200mg each) together every 4 hours as needed for pain. (do not take these if you are allergic to them or if you have a reason you should not take them.) Typically, you may only need pain medication for 1 to 3 days.    

## 2023-07-07 ENCOUNTER — Other Ambulatory Visit: Payer: Self-pay | Admitting: Internal Medicine

## 2023-07-10 ENCOUNTER — Ambulatory Visit: Admitting: Internal Medicine

## 2023-07-11 DIAGNOSIS — R0609 Other forms of dyspnea: Secondary | ICD-10-CM | POA: Diagnosis not present

## 2023-07-11 DIAGNOSIS — J441 Chronic obstructive pulmonary disease with (acute) exacerbation: Secondary | ICD-10-CM | POA: Diagnosis not present

## 2023-07-12 DIAGNOSIS — F331 Major depressive disorder, recurrent, moderate: Secondary | ICD-10-CM | POA: Diagnosis not present

## 2023-08-01 DIAGNOSIS — F331 Major depressive disorder, recurrent, moderate: Secondary | ICD-10-CM | POA: Diagnosis not present

## 2023-08-01 DIAGNOSIS — F411 Generalized anxiety disorder: Secondary | ICD-10-CM | POA: Diagnosis not present

## 2023-08-03 ENCOUNTER — Ambulatory Visit: Admitting: Nurse Practitioner

## 2023-08-03 VITALS — BP 134/72 | HR 75 | Temp 97.6°F | Ht 63.5 in | Wt 180.4 lb

## 2023-08-03 DIAGNOSIS — R21 Rash and other nonspecific skin eruption: Secondary | ICD-10-CM | POA: Insufficient documentation

## 2023-08-03 DIAGNOSIS — R609 Edema, unspecified: Secondary | ICD-10-CM | POA: Diagnosis not present

## 2023-08-03 DIAGNOSIS — L309 Dermatitis, unspecified: Secondary | ICD-10-CM | POA: Insufficient documentation

## 2023-08-03 LAB — CBC WITH DIFFERENTIAL/PLATELET
Basophils Absolute: 0.1 10*3/uL (ref 0.0–0.1)
Basophils Relative: 0.6 % (ref 0.0–3.0)
Eosinophils Absolute: 0.3 10*3/uL (ref 0.0–0.7)
Eosinophils Relative: 2.4 % (ref 0.0–5.0)
HCT: 42.8 % (ref 36.0–46.0)
Hemoglobin: 14.1 g/dL (ref 12.0–15.0)
Lymphocytes Relative: 28.6 % (ref 12.0–46.0)
Lymphs Abs: 3.3 10*3/uL (ref 0.7–4.0)
MCHC: 32.9 g/dL (ref 30.0–36.0)
MCV: 98.9 fl (ref 78.0–100.0)
Monocytes Absolute: 0.7 10*3/uL (ref 0.1–1.0)
Monocytes Relative: 6.2 % (ref 3.0–12.0)
Neutro Abs: 7.2 10*3/uL (ref 1.4–7.7)
Neutrophils Relative %: 62.2 % (ref 43.0–77.0)
Platelets: 285 10*3/uL (ref 150.0–400.0)
RBC: 4.33 Mil/uL (ref 3.87–5.11)
RDW: 14.5 % (ref 11.5–15.5)
WBC: 11.6 10*3/uL — ABNORMAL HIGH (ref 4.0–10.5)

## 2023-08-03 LAB — COMPREHENSIVE METABOLIC PANEL WITH GFR
ALT: 25 U/L (ref 0–35)
AST: 20 U/L (ref 0–37)
Albumin: 4.2 g/dL (ref 3.5–5.2)
Alkaline Phosphatase: 106 U/L (ref 39–117)
BUN: 14 mg/dL (ref 6–23)
CO2: 30 meq/L (ref 19–32)
Calcium: 9.2 mg/dL (ref 8.4–10.5)
Chloride: 103 meq/L (ref 96–112)
Creatinine, Ser: 0.66 mg/dL (ref 0.40–1.20)
GFR: 92.92 mL/min (ref >60.00)
Glucose, Bld: 124 mg/dL — ABNORMAL HIGH (ref 70–99)
Potassium: 4.6 meq/L (ref 3.5–5.1)
Sodium: 139 meq/L (ref 135–145)
Total Bilirubin: 0.4 mg/dL (ref 0.2–1.2)
Total Protein: 6.5 g/dL (ref 6.0–8.3)

## 2023-08-03 LAB — C-REACTIVE PROTEIN: CRP: <1 mg/dL (ref 0.5–20.0)

## 2023-08-03 LAB — SEDIMENTATION RATE: Sed Rate: 4 mm/h (ref 0–30)

## 2023-08-03 MED ORDER — TRIAMCINOLONE ACETONIDE 0.5 % EX OINT: 1.0000 | TOPICAL_OINTMENT | Freq: Two times a day (BID) | CUTANEOUS | 1 refills | Status: AC

## 2023-08-03 NOTE — Assessment & Plan Note (Addendum)
 Acute, etiology unclear.  VSS I do see evidence of soft tissue swelling in upper and lower extremities. Etiology is unclear, I think likelihood of DVT is low however patient did recently have prolonged travel, and is experiencing pain in left leg behind knee and down calf.  She does have swelling bilaterally, will order stat ultrasound to rule out DVT. No joint pain, thus I think this is unlikely due to rheumatoid arthritis. Not really experiencing pain with rash, however will check CRP, sed rate, ANA to evaluate for vasculitis versus possible autoimmune etiology.  Patient has not noticed a tick bite but is very concerned about possible Lyme disease, will check Lyme antibodies. I do wonder if she may have some peripheral vascular disease as swelling is much improved upon waking up in the morning, recommend compression stockings to lower extremities as well as elevation when at rest.

## 2023-08-03 NOTE — Progress Notes (Signed)
 Established Patient Office Visit  Subjective   Patient ID: Crystal Duarte, female    DOB: 1960-10-30  Age: 63 y.o. MRN: [id number]  Chief Complaint  Patient presents with   Edema    Patient arrives today for the above. She is a 63 y/o female with PMHx of anxiety, depression, COPD, coccidioidomycosis (pulmonary), osteopenia.    She reports that since February she has been experiencing swelling but over the last month or so swelling has gotten much worse.  Swelling is occurring bilateral hands and bilateral lower extremities.  She recently returned home from a trip to [place], she flew there and returned on 07/21/2023.  Reports hands and feet are very tight feeling, left leg worse than right leg.  Reports pain behind knee of left leg.  Swelling improves upon waking up in the morning and worsens throughout the day.  She also reports new onset of rash to bilateral upper extremities and abdomen.  Rash on abdomen has been very itchy.  She reports that all the symptoms were well-controlled when she was on compounded Mounjaro and would like to restart either Mounjaro or Zepbound at some point.  She stopped this due to cost. BMI is 31.45.  She is not on HRT, as stated above she did have recent travel, no recent surgeries. No joint pain.     ROS: see HPI    Objective:     BP 134/72   Pulse 75   Temp 97.6 F (36.4 C) (Temporal)   Ht 5' 3.5" (1.613 m)   Wt 180 lb 6 oz (81.8 kg)   SpO2 97%   BMI 31.45 kg/m  BP Readings from Last 3 Encounters:  08/03/23 134/72  07/06/23 98/64  05/22/23 130/80   Wt Readings from Last 3 Encounters:  08/03/23 180 lb 6 oz (81.8 kg)  07/06/23 176 lb (79.8 kg)  05/22/23 169 lb (76.7 kg)      Physical Exam Vitals reviewed.  Constitutional:      General: She is not in acute distress.    Appearance: Normal appearance.  HENT:     Head: Normocephalic and atraumatic.  Neck:     Vascular: No carotid bruit.  Cardiovascular:     Rate and Rhythm:  Normal rate and regular rhythm.     Pulses: Normal pulses.     Heart sounds: Normal heart sounds.  Pulmonary:     Effort: Pulmonary effort is normal.     Breath sounds: Normal breath sounds.  Musculoskeletal:     Right hand: Swelling present. No deformity or tenderness.     Left hand: Swelling present. No deformity or tenderness.     Right lower leg: Edema (non pitting) present.     Left lower leg: Edema (non pitting) present.  Skin:    General: Skin is warm and dry.          Comments: Red lines indicate areas of concern that she has related to skin changes. Appears consistent with senile purpura on arms.   Abdomen has maculopapular rash with some secondary scabbing likely from scratching. Base is red, crosses midline, no vesicles, no drainage.   Neurological:     General: No focal deficit present.     Mental Status: She is alert and oriented to person, place, and time.  Psychiatric:        Mood and Affect: Mood normal.        Behavior: Behavior normal.        Judgment: Judgment normal.  No results found for any visits on 08/03/23.  Last CBC Lab Results  Component Value Date   WBC 11.2 (H) 07/06/2023   HGB 13.7 07/06/2023   HCT 41.9 07/06/2023   MCV 98.1 07/06/2023   MCH 31.7 03/29/2022   RDW 14.9 07/06/2023   PLT 267.0 07/06/2023   Last metabolic panel Lab Results  Component Value Date   GLUCOSE 100 (H) 07/06/2023   NA 142 07/06/2023   K 4.0 07/06/2023   CL 104 07/06/2023   CO2 30 07/06/2023   BUN 7 07/06/2023   CREATININE 0.73 07/06/2023   GFR 87.16 07/06/2023   CALCIUM  8.8 07/06/2023   PHOS 4.5 01/07/2022   PROT 6.2 07/06/2023   ALBUMIN 4.1 07/06/2023   BILITOT 0.7 07/06/2023   ALKPHOS 67 07/06/2023   AST 16 07/06/2023   ALT 26 07/06/2023   ANIONGAP 7 03/29/2022      The 10-year ASCVD risk score (Arnett DK, et al., 2019) is: 5.1%    Assessment & Plan:   Problem List Items Addressed This Visit       Musculoskeletal and Integument   Rash    Acute Area of concern on bilateral upper extremities appears consistent with senile purpura or ecchymosis.  Questionable petechiae as well.  Recently had CBC about 1 month ago which identified normal platelet count and normal differential.  Will repeat this today for further evaluation. Rash on abdomen appears consistent with contact dermatitis.  Will trial course of topical Kenalog  ointment that she can apply twice a day.  May consider referral to dermatology if symptoms persist.      Relevant Medications   triamcinolone  ointment (KENALOG ) 0.5 %   Other Relevant Orders   ANA   CBC with Differential/Platelet   Comprehensive metabolic panel with GFR   B. burgdorfi antibodies   C-reactive protein   Sedimentation rate     Other   Swelling - Primary   Acute, etiology unclear.  VSS I do see evidence of soft tissue swelling in upper and lower extremities. Etiology is unclear, I think likelihood of DVT is low however patient did recently have prolonged travel, and is experiencing pain in left leg behind knee and down calf.  She does have swelling bilaterally, will order stat ultrasound to rule out DVT. No joint pain, thus I think this is unlikely due to rheumatoid arthritis. Not really experiencing pain with rash, however will check CRP, sed rate, ANA to evaluate for vasculitis versus possible autoimmune etiology.  Patient has not noticed a tick bite but is very concerned about possible Lyme disease, will check Lyme antibodies. I do wonder if she may have some peripheral vascular disease as swelling is much improved upon waking up in the morning, recommend compression stockings to lower extremities as well as elevation when at rest.       Relevant Orders   ANA   CBC with Differential/Platelet   Comprehensive metabolic panel with GFR   B. burgdorfi antibodies   US  Venous Img Lower Bilateral (DVT)    Of note, patient would like to restart Zepbound for treatment of weight loss.  She would  like to consider going through Psychiatrist for the cash discount.  I recommend she follow-up with her PCP to discuss further.  Return in about 1 month (around 09/03/2023) for f/u with PCP at next available appointment to discuss Zepbound.   Total time spent on encounter today was 33 minutes including face-to-face evaluation, review of previous medical records,  and development/discussion of treatment plan.  Zorita Hiss, NP

## 2023-08-03 NOTE — Assessment & Plan Note (Signed)
 Acute Area of concern on bilateral upper extremities appears consistent with senile purpura or ecchymosis.  Questionable petechiae as well.  Recently had CBC about 1 month ago which identified normal platelet count and normal differential.  Will repeat this today for further evaluation. Rash on abdomen appears consistent with contact dermatitis.  Will trial course of topical Kenalog  ointment that she can apply twice a day.  May consider referral to dermatology if symptoms persist.

## 2023-08-04 ENCOUNTER — Ambulatory Visit: Payer: Self-pay | Admitting: *Deleted

## 2023-08-04 LAB — ANA: Anti Nuclear Antibody (ANA): NEGATIVE

## 2023-08-04 LAB — B. BURGDORFI ANTIBODIES: B burgdorferi Ab IgG+IgM: <0.9 {index}

## 2023-08-04 NOTE — Telephone Encounter (Signed)
 Pt calling to review  lab results from yesterday.  In review of chart , no recommendations from provider noted at this time. Patient reports she wants PCP to review labs and give feedback. Requested appt with PCP and NT scheduled for 08/18/23 for issues with leg swelling. Patient last OV yesterday and reports she does not know why leg swelling continues. Reviewed with patient to allow PCP time to review results . Pt verbalized understanding.     Copied from CRM [id number]. Topic: Clinical - Red Word Triage >> Aug 04, 2023 11:43 AM Deaijah H wrote: Red Word that prompted transfer to Nurse Triage: Lot of swelling in legs/hands/wrist. Mainly legs Reason for Disposition  [1] Caller requesting NON-URGENT health information AND [2] PCP's office is the best resource  Answer Assessment - Initial Assessment Questions 1. REASON FOR CALL or QUESTION: "What is your reason for calling today?" or "How can I best help you?" or "What question do you have that I can help answer?"     Patient requesting call back from PCP regarding recent lab results and causes of leg swelling.  Protocols used: Information Only Call - No Triage-A-AH

## 2023-08-07 ENCOUNTER — Encounter: Payer: Self-pay | Admitting: Nurse Practitioner

## 2023-08-09 ENCOUNTER — Ambulatory Visit
Admission: RE | Admit: 2023-08-09 | Discharge: 2023-08-09 | Disposition: A | Discharge status: Home / Self Care | Source: Ambulatory Visit | Attending: Nurse Practitioner | Admitting: Nurse Practitioner

## 2023-08-09 DIAGNOSIS — R609 Edema, unspecified: Secondary | ICD-10-CM

## 2023-08-09 DIAGNOSIS — M7989 Other specified soft tissue disorders: Secondary | ICD-10-CM | POA: Diagnosis not present

## 2023-08-10 ENCOUNTER — Encounter: Payer: Self-pay | Admitting: Nurse Practitioner

## 2023-08-10 DIAGNOSIS — J441 Chronic obstructive pulmonary disease with (acute) exacerbation: Secondary | ICD-10-CM | POA: Diagnosis not present

## 2023-08-10 DIAGNOSIS — R0609 Other forms of dyspnea: Secondary | ICD-10-CM | POA: Diagnosis not present

## 2023-08-11 ENCOUNTER — Encounter (HOSPITAL_COMMUNITY): Payer: Self-pay

## 2023-08-18 ENCOUNTER — Encounter: Payer: Self-pay | Admitting: Internal Medicine

## 2023-08-18 ENCOUNTER — Ambulatory Visit: Admitting: Internal Medicine

## 2023-08-18 VITALS — BP 120/80 | HR 91 | Temp 97.9°F | Ht 63.5 in | Wt 182.0 lb

## 2023-08-18 DIAGNOSIS — F3342 Major depressive disorder, recurrent, in full remission: Secondary | ICD-10-CM

## 2023-08-18 DIAGNOSIS — I700 Atherosclerosis of aorta: Secondary | ICD-10-CM

## 2023-08-18 DIAGNOSIS — I739 Peripheral vascular disease, unspecified: Secondary | ICD-10-CM

## 2023-08-18 DIAGNOSIS — R600 Localized edema: Secondary | ICD-10-CM

## 2023-08-18 DIAGNOSIS — E669 Obesity, unspecified: Secondary | ICD-10-CM | POA: Diagnosis not present

## 2023-08-18 DIAGNOSIS — J9611 Chronic respiratory failure with hypoxia: Secondary | ICD-10-CM

## 2023-08-18 MED ORDER — ZEPBOUND 7.5 MG/0.5ML ~~LOC~~ SOAJ
7.5000 mg | SUBCUTANEOUS | 0 refills | Status: DC
Start: 2023-08-18 — End: 2023-12-29

## 2023-08-18 MED ORDER — ZEPBOUND 5 MG/0.5ML ~~LOC~~ SOAJ: 5.0000 mg | SUBCUTANEOUS | 0 refills | Status: DC

## 2023-08-18 MED ORDER — ZEPBOUND 2.5 MG/0.5ML ~~LOC~~ SOAJ
2.5000 mg | SUBCUTANEOUS | 0 refills | Status: DC
Start: 2023-08-18 — End: 2023-12-29

## 2023-08-18 MED ORDER — NYSTATIN-TRIAMCINOLONE 100000-0.1 UNIT/GM-% EX OINT: 1.0000 | TOPICAL_OINTMENT | Freq: Two times a day (BID) | CUTANEOUS | 3 refills | Status: AC

## 2023-08-18 NOTE — Assessment & Plan Note (Signed)
 Taking crestor  will continue. Would benefit from weight loss with zepbound to lower CV risk.

## 2023-08-18 NOTE — Assessment & Plan Note (Addendum)
 Rx zepbound for weight also has concurrent aortic atherosclerosis and peripheral artery disease and chronic respiratory failure with hypoxia.

## 2023-08-18 NOTE — Assessment & Plan Note (Signed)
 Taking effexor  and overall okay control she was more controlled on zepbound due to less intrusive thoughts.

## 2023-08-18 NOTE — Patient Instructions (Signed)
 We will send in the zepbound to get back on.  We have sent in cream to use on the chest area.

## 2023-08-18 NOTE — Progress Notes (Signed)
   Subjective:   Patient ID: Crystal Duarte, female    DOB: Mar 16, 1961, 63 y.o.   MRN: [id number]  HPI The patient is a 63 YO female coming in for swelling in legs. Had labs and US  which were not indicative of cause. She is doing a little better. Was taking compounded zepbound and did well can not afford now and wants to resume branded if able.   Review of Systems  Constitutional: Negative.   HENT: Negative.    Eyes: Negative.   Respiratory:  Negative for cough, chest tightness and shortness of breath.   Cardiovascular:  Negative for chest pain, palpitations and leg swelling.  Gastrointestinal:  Negative for abdominal distention, abdominal pain, constipation, diarrhea, nausea and vomiting.  Musculoskeletal: Negative.   Skin: Negative.   Neurological: Negative.   Psychiatric/Behavioral: Negative.      Objective:  Physical Exam Constitutional:      Appearance: She is well-developed.  HENT:     Head: Normocephalic and atraumatic.  Cardiovascular:     Rate and Rhythm: Normal rate and regular rhythm.  Pulmonary:     Effort: Pulmonary effort is normal. No respiratory distress.     Breath sounds: Normal breath sounds. No wheezing or rales.  Abdominal:     General: Bowel sounds are normal. There is no distension.     Palpations: Abdomen is soft.     Tenderness: There is no abdominal tenderness. There is no rebound.  Musculoskeletal:     Cervical back: Normal range of motion.  Skin:    General: Skin is warm and dry.  Neurological:     Mental Status: She is alert and oriented to person, place, and time.     Coordination: Coordination normal.     Vitals:   08/18/23 1121  BP: 120/80  Pulse: 91  Temp: 97.9 F (36.6 C)  TempSrc: Oral  SpO2: 92%  Weight: 182 lb (82.6 kg)  Height: 5' 3.5" (1.613 m)    Assessment & Plan:

## 2023-08-18 NOTE — Assessment & Plan Note (Signed)
 Reviewed recent assessment with her and she has worked on some weight loss which helped. We are trying to resume zepbound as this would help as well.

## 2023-08-18 NOTE — Assessment & Plan Note (Signed)
 Uses oxygen  while sleeping 2L/minute.

## 2023-08-18 NOTE — Assessment & Plan Note (Signed)
 Stable on exam and taking crestor  to help. Will continue.

## 2023-08-30 ENCOUNTER — Other Ambulatory Visit (HOSPITAL_COMMUNITY): Payer: Self-pay

## 2023-08-30 ENCOUNTER — Telehealth: Payer: Self-pay

## 2023-08-30 NOTE — Telephone Encounter (Signed)
 Pharmacy Patient Advocate Encounter   Received notification from Onbase that prior authorization for Zepbound  2.5MG /0.5ML pen-injectors is required/requested.   Insurance verification completed.   The patient is insured through [employer] .   Per test claim: Not covered under part D law, product/service not covered

## 2023-08-31 NOTE — Telephone Encounter (Unsigned)
 Copied from CRM [id number]. Topic: Clinical - Prescription Issue >> Aug 31, 2023 11:36 AM Jenice Mitts wrote: Reason for CRM: Patient is calling because her pharmacy told her that the zepbound  prescription sent over did not have the MG she needed to take on it and the pharmacy is having an hard time to get it filled because of the insurance.  She would like a call to get the issue fixed

## 2023-09-01 NOTE — Telephone Encounter (Signed)
 Copied from CRM [id number]. Topic: Clinical - Prescription Issue >> Sep 01, 2023  1:01 PM Caliyah H wrote: Patient called to follow up. I informed the patient that the CMA  had clarify the dosage with Dr. Nicolette Barrio. Patient verbalized understanding and stated that if the prescription is not approved by insurance, she would like for Dr. Nicolette Barrio to consider sending alternative medication instead.

## 2023-09-01 NOTE — Telephone Encounter (Signed)
 Called pharmacy and verified.

## 2023-09-01 NOTE — Telephone Encounter (Signed)
 Yes initial dosing is 2.5 mg weekly

## 2023-09-01 NOTE — Telephone Encounter (Signed)
 It is 2.5mg  correct?

## 2023-09-04 DIAGNOSIS — Z0100 Encounter for examination of eyes and vision without abnormal findings: Secondary | ICD-10-CM | POA: Diagnosis not present

## 2023-09-04 DIAGNOSIS — Z961 Presence of intraocular lens: Secondary | ICD-10-CM | POA: Diagnosis not present

## 2023-09-10 DIAGNOSIS — R0609 Other forms of dyspnea: Secondary | ICD-10-CM | POA: Diagnosis not present

## 2023-09-10 DIAGNOSIS — J441 Chronic obstructive pulmonary disease with (acute) exacerbation: Secondary | ICD-10-CM | POA: Diagnosis not present

## 2023-09-19 DIAGNOSIS — F331 Major depressive disorder, recurrent, moderate: Secondary | ICD-10-CM | POA: Diagnosis not present

## 2023-09-19 DIAGNOSIS — F411 Generalized anxiety disorder: Secondary | ICD-10-CM | POA: Diagnosis not present

## 2023-09-25 ENCOUNTER — Other Ambulatory Visit: Payer: Self-pay | Admitting: Internal Medicine

## 2023-09-25 NOTE — Telephone Encounter (Signed)
 Copied from CRM [id number]. Topic: Clinical - Medication Question >> Sep 25, 2023  1:19 PM Burnard DEL wrote: Reason for CRM: Patient called in to follow up on message that she was sent to provider 3 weeks ago and she never got a call back.She would like to know if provider or assistant ever followed up with insurance company regarding zepbound  medication.Patient still would like to know if an alternative medication could be prescribed besides zepbound  since insurance company did not approve it? Please contact patient for an update.

## 2023-09-28 NOTE — Telephone Encounter (Signed)
 If there is another alternative please send in for patient as insurance denied the zepbound 

## 2023-09-29 NOTE — Telephone Encounter (Signed)
 Generally if zepbound  if denied it is because her insurance does not cover weight loss medications. She can check with them on coverage for wegovy if desired.

## 2023-10-04 ENCOUNTER — Other Ambulatory Visit: Payer: Self-pay | Admitting: Radiology

## 2023-10-04 DIAGNOSIS — Z1231 Encounter for screening mammogram for malignant neoplasm of breast: Secondary | ICD-10-CM

## 2023-10-10 DIAGNOSIS — J441 Chronic obstructive pulmonary disease with (acute) exacerbation: Secondary | ICD-10-CM | POA: Diagnosis not present

## 2023-10-10 DIAGNOSIS — R0609 Other forms of dyspnea: Secondary | ICD-10-CM | POA: Diagnosis not present

## 2023-10-18 DIAGNOSIS — F331 Major depressive disorder, recurrent, moderate: Secondary | ICD-10-CM | POA: Diagnosis not present

## 2023-10-18 DIAGNOSIS — F411 Generalized anxiety disorder: Secondary | ICD-10-CM | POA: Diagnosis not present

## 2023-11-10 DIAGNOSIS — J441 Chronic obstructive pulmonary disease with (acute) exacerbation: Secondary | ICD-10-CM | POA: Diagnosis not present

## 2023-11-10 DIAGNOSIS — R0609 Other forms of dyspnea: Secondary | ICD-10-CM | POA: Diagnosis not present

## 2023-11-13 ENCOUNTER — Other Ambulatory Visit: Payer: Self-pay | Admitting: Medical Genetics

## 2023-11-14 DIAGNOSIS — F331 Major depressive disorder, recurrent, moderate: Secondary | ICD-10-CM | POA: Diagnosis not present

## 2023-11-14 DIAGNOSIS — F411 Generalized anxiety disorder: Secondary | ICD-10-CM | POA: Diagnosis not present

## 2023-11-16 ENCOUNTER — Ambulatory Visit: Payer: Self-pay | Admitting: Internal Medicine

## 2023-11-16 NOTE — Telephone Encounter (Signed)
 It looks like she wanted to wait until her yearly follow up with Dr. Neysa, which they scheduled? If she needs this addressed sooner, OV needs to be moved up.

## 2023-11-16 NOTE — Telephone Encounter (Signed)
 Appointment scheduled for patient---One year follow up with Dr Neysa on 01/11/2024 at 1:45 PM as per patient's request   FYI Only or Action Required?: FYI only for provider.  Patient was last seen in primary care on 08/18/2023 by Rollene Almarie LABOR, MD.  Called Nurse Triage reporting Shortness of Breath.  Symptoms began gradually over the past year since patient's last visit.  Interventions attempted: Prescription medications: Nebs and inhalers.  Symptoms are: gradually worsening over the past year---no acute symptoms per patient.  Triage Disposition: No disposition on file.  Patient/caregiver understands and will follow disposition?: Patient scheduled for appointment with Dr Neysa for one year follow up            Copied from CRM [id number]. Topic: Clinical - Red Word Triage >> Nov 16, 2023  1:10 PM Chantha C wrote: [facility] that prompted transfer to Nurse Triage: Patient [phone] states more shortness of breath when walking, wheezing, clearing throat and dizziness. Patient is unsure if it's due to the weather. Patient denies fever, nor pain. Patient want to schedule an yearly follow up lov 10/17/22 with Dr. Neysa. Please advise.   ----------------------------------------------------------------------- From previous Reason for Contact - Scheduling: Patient/patient representative is calling to schedule an appointment. Refer to attachments for appointment information. Answer Assessment - Initial Assessment Questions Patient initially called just to schedule a one year follow up with her Pulmonologist Patient denies any difficulty breathing at this time Patient states that none of her symptoms are new within the past two weeks and that gradually over the course of entire year since her last appointment with Dr Neysa her overall shortness of breath on exertion has gotten a little more frequent--she is unsure if it is the weather or allergies Patient denies any symptoms at this  time and simply wants to make an appointment Patient was frustrated and did not want to be called back by the office---She wanted an appointment to be scheduled  This RN called [facility] about this situation and was advised that there were no sooner appointments for Dr Neysa and if the patient wants to be seen sooner she can go on MyChart and schedule an acute appointment. Patient didn't want anything sooner for any acute issues. She just wanted to be scheduled with Dr Neysa at his next available. Patient is advised to call us  back at anytime with any questions or concerns.   [facility] Triage - Initial Assessment Questions Chief Complaint (e.g., cough, sob, wheezing, fever, chills, sweat or additional symptoms) *Go to specific symptom protocol after initial questions. More shortness of breath Sometimes when she coughs and stands she gets dizzy Patient states that she is clearing her throat more--unsure if it is allergy or weather related  How long have symptoms been present? Over the past year  Have you tested for COVID or Flu? Note: If not, ask patient if a home test can be taken. If so, instruct patient to call back for positive results. No  MEDICINES:   Have you used any OTC meds to help with symptoms? No If yes, ask What medications? N/A  Have you used your inhalers/maintenance medication? Yes If yes, What medications? Albuterol Inhaler--Inhale 2 puffs into the lungs every 4 (four) hours as needed. Trelegy-- Inhale 1 puff then rinse mouth, once daily   If inhaler, ask How many puffs and how often? Note: Review instructions on medication in the chart. Albuterol Inhaler--Inhale 2 puffs into the lungs every 4 (four) hours as needed. Trelegy-- Inhale 1 puff then  rinse mouth, once daily  OXYGEN: Do you wear supplemental oxygen? Yes If yes, How many liters are you supposed to use? 3  sometimes 2 liters  only at night  Do you monitor your oxygen levels? Yes If  yes, What is your reading (oxygen level) today? It's been fine  What is your usual oxygen saturation reading?  (Note: Pulmonary O2 sats should be 90% or greater) It's been fine    3. PATTERN Does the difficult breathing come and go, or has it been constant since it started?      Worse on exertion   increasing in frequency 4. SEVERITY: How bad is your breathing? (e.g., mild, moderate, severe)      ----- 5. RECURRENT SYMPTOM: Have you had difficulty breathing before? If Yes, ask: When was the last time? and What happened that time?      ---- 6. CARDIAC HISTORY: Do you have any history of heart disease? (e.g., heart attack, angina, bypass surgery, angioplasty)      Patient denies 7. LUNG HISTORY: Do you have any history of lung disease?  (e.g., pulmonary embolus, asthma, emphysema)     COPD 8. CAUSE: What do you think is causing the breathing problem?      Possibly the weather 9. OTHER SYMPTOMS: Do you have any other symptoms? (e.g., chest pain, cough, dizziness, fever, runny nose)     More shortness of breath Sometimes when she coughs and stands she gets dizzy Patient states that she is clearing her throat more--unsure if it is allergy or weather related 10. O2 SATURATION MONITOR:  Do you use an oxygen saturation monitor (pulse oximeter) at home? If Yes, ask: What is your reading (oxygen level) today? What is your usual oxygen saturation reading? (e.g., 95%)    -----  Protocols used: Breathing Difficulty-A-AH

## 2023-11-20 ENCOUNTER — Ambulatory Visit
Admission: RE | Admit: 2023-11-20 | Discharge: 2023-11-20 | Disposition: A | Discharge status: Home / Self Care | Source: Ambulatory Visit | Attending: Radiology | Admitting: Radiology

## 2023-11-20 DIAGNOSIS — Z1231 Encounter for screening mammogram for malignant neoplasm of breast: Secondary | ICD-10-CM | POA: Diagnosis not present

## 2023-11-24 ENCOUNTER — Other Ambulatory Visit: Payer: Self-pay | Admitting: Radiology

## 2023-11-24 DIAGNOSIS — R928 Other abnormal and inconclusive findings on diagnostic imaging of breast: Secondary | ICD-10-CM

## 2023-11-28 ENCOUNTER — Ambulatory Visit
Admission: RE | Admit: 2023-11-28 | Discharge: 2023-11-28 | Disposition: A | Discharge status: Home / Self Care | Source: Ambulatory Visit | Attending: Radiology | Admitting: Radiology

## 2023-11-28 ENCOUNTER — Other Ambulatory Visit: Payer: Self-pay

## 2023-11-28 ENCOUNTER — Ambulatory Visit

## 2023-11-28 DIAGNOSIS — Z006 Encounter for examination for normal comparison and control in clinical research program: Secondary | ICD-10-CM

## 2023-11-28 DIAGNOSIS — R928 Other abnormal and inconclusive findings on diagnostic imaging of breast: Secondary | ICD-10-CM | POA: Diagnosis not present

## 2023-12-08 LAB — GENECONNECT MOLECULAR SCREEN: Genetic Analysis Overall Interpretation: NEGATIVE

## 2023-12-11 DIAGNOSIS — J441 Chronic obstructive pulmonary disease with (acute) exacerbation: Secondary | ICD-10-CM | POA: Diagnosis not present

## 2023-12-11 DIAGNOSIS — R0609 Other forms of dyspnea: Secondary | ICD-10-CM | POA: Diagnosis not present

## 2023-12-15 ENCOUNTER — Telehealth: Payer: Self-pay | Admitting: Radiology

## 2023-12-15 ENCOUNTER — Ambulatory Visit (INDEPENDENT_AMBULATORY_CARE_PROVIDER_SITE_OTHER)

## 2023-12-15 VITALS — Ht 63.5 in | Wt 169.0 lb

## 2023-12-15 DIAGNOSIS — Z0000 Encounter for general adult medical examination without abnormal findings: Secondary | ICD-10-CM | POA: Diagnosis not present

## 2023-12-15 NOTE — Telephone Encounter (Signed)
Pharmacy is on file

## 2023-12-15 NOTE — Progress Notes (Signed)
 Subjective:  Please attest and cosign this visit due to patients primary care provider not being in the office at the time the visit was completed.  (Pt of Dr Almarie Cleveland)   Crystal Duarte is a 63 y.o. who presents for a Medicare Wellness preventive visit.  As a reminder, Annual Wellness Visits don't include a physical exam, and some assessments may be limited, especially if this visit is performed virtually. We may recommend an in-person follow-up visit with your provider if needed.  Visit Complete: Virtual I connected with  [name] on 12/15/23 by a audio enabled telemedicine application and verified that I am speaking with the correct person using two identifiers.  Patient Location: Home  Provider Location: Office/Clinic  I discussed the limitations of evaluation and management by telemedicine. The patient expressed understanding and agreed to proceed.  Vital Signs: Because this visit was a virtual/telehealth visit, some criteria may be missing or patient reported. Any vitals not documented were not able to be obtained and vitals that have been documented are patient reported.  VideoDeclined- This patient declined Librarian, academic. Therefore the visit was completed with audio only.  Persons Participating in Visit: Patient.  AWV Questionnaire: Yes: Patient Medicare AWV questionnaire was completed by the patient on 12/11/2023; I have confirmed that all information answered by patient is correct and no changes since this date.  Cardiac Risk Factors include: advanced age (>59men, >101 women);dyslipidemia     Objective:    Today's Vitals   12/15/23 1515  Weight: 169 lb (76.7 kg)  Height: 5' 3.5 (1.613 m)   Body mass index is 29.47 kg/m.     12/15/2023    3:15 PM 03/29/2022    6:03 PM 02/08/2022   11:38 PM 01/01/2022   10:12 AM 11/12/2021   11:23 AM 10/15/2021    4:57 PM 12/06/2018    9:31 AM  Advanced Directives  Does Patient  Have a Medical Advance Directive? Yes No Yes No No Yes Yes  Type of Estate agent of [place];Living will  Healthcare Power of [place];Living will   Healthcare Power of [place];Living will Healthcare Power of [place];Living will  Does patient want to make changes to medical advance directive? No - Patient declined  No - Patient declined    No - Patient declined  Copy of Healthcare Power of Attorney in Chart? Yes - validated most recent copy scanned in chart (See row information)  Yes - validated most recent copy scanned in chart (See row information)    No - copy requested  Would patient like information on creating a medical advance directive?  No - Patient declined No - Patient declined No - Patient declined No - Patient declined      Current Medications (verified) Outpatient Encounter Medications as of 12/15/2023  Medication Sig   albuterol  (VENTOLIN  HFA) 108 (90 Base) MCG/ACT inhaler Inhale 2 puffs into the lungs every 4 (four) hours as needed.   busPIRone  (BUSPAR ) 10 MG tablet Take two tablets three times daily for anxiety.   Cholecalciferol (VITAMIN D3 PO) Take by mouth.   Fluticasone -Umeclidin-Vilant (TRELEGY ELLIPTA ) 100-62.5-25 MCG/ACT AEPB Inhale 1 puff then rinse mouth, once daily   gabapentin  (NEURONTIN ) 400 MG capsule Take 1 capsule (400 mg total) by mouth 2 (two) times daily.   gabapentin  (NEURONTIN ) 600 MG tablet Take 1 tablet (600 mg total) by mouth at bedtime.   ipratropium-albuterol  (DUONEB) 0.5-2.5 (3) MG/3ML SOLN Take 3 mLs by nebulization every 6 (six)  hours as needed.   montelukast  (SINGULAIR ) 10 MG tablet Take 1 tablet (10 mg total) by mouth at bedtime.   Multiple Vitamin (MULTI-VITAMINS) TABS Take 1 tablet by mouth daily.    nystatin  cream (MYCOSTATIN ) Apply 1 Application topically 2 (two) times daily.   nystatin -triamcinolone  ointment (MYCOLOG) Apply 1 Application topically 2 (two) times daily.   omeprazole  (PRILOSEC) 20 MG capsule TAKE 1 CAPSULE  BY MOUTH DAILY   propranolol  (INDERAL ) 10 MG tablet Take 1 tablet (10 mg total) by mouth 3 (three) times daily.   rosuvastatin  (CRESTOR ) 20 MG tablet Take 1 tablet (20 mg total) by mouth daily.   traZODone  (DESYREL ) 100 MG tablet Take 2.5 tablets (250 mg total) by mouth at bedtime.   triamcinolone  ointment (KENALOG ) 0.5 % Apply 1 Application topically 2 (two) times daily.   valACYclovir  (VALTREX ) 1000 MG tablet Take 1 tablet (1,000 mg total) by mouth 2 (two) times daily.   venlafaxine  XR (EFFEXOR  XR) 75 MG 24 hr capsule Take 1 capsule (75 mg total) by mouth daily with breakfast.   venlafaxine  XR (EFFEXOR -XR) 150 MG 24 hr capsule Take 1 capsule (150 mg total) by mouth daily with breakfast.   tirzepatide  (ZEPBOUND ) 2.5 MG/0.5ML Pen Inject 2.5 mg into the skin once a week.   tirzepatide  (ZEPBOUND ) 5 MG/0.5ML Pen Inject 5 mg into the skin once a week.   tirzepatide  (ZEPBOUND ) 7.5 MG/0.5ML Pen Inject 7.5 mg into the skin once a week.   No facility-administered encounter medications on file as of 12/15/2023.    Allergies (verified) Other, Procaine, Latuda [lurasidone hcl], and Sulfa antibiotics   History: Past Medical History:  Diagnosis Date   Coccidioidomycosis, pulmonary (HCC)    COPD (chronic obstructive pulmonary disease) (HCC)    Depression    Hx of adenomatous polyp of colon 12/04/2014   Major depressive disorder    since 14, electroconvulsive therapy, and transcranial magnetic stimulation recently for 6 weeks every day    Osteopenia 01/2019   T score -2.1 FRAX 9.6% / 2.1% stable from prior DEXA   Panic attack    PONV (postoperative nausea and vomiting)    VAIN I (vaginal intraepithelial neoplasia grade I) 2015   Positive HPV negative subtype 16, 18/45   Past Surgical History:  Procedure Laterality Date   ABDOMINAL HYSTERECTOMY     TAH BSO   BILATERAL VATS ABLATION     vats for biopsy due to infection   BREAST EXCISIONAL BIOPSY Right    benign   CATARACT EXTRACTION      COLONOSCOPY  2001   hemorrhoidectomy   HEMORRHOID SURGERY     LUNG SURGERY  2001   VATS surgery    OVARIAN CYST REMOVAL  1970's   ULNAR NERVE TRANSPOSITION Left 12/06/2018   Procedure: LEFT ULNAR NERVE DECOMPRESSION,  ;  Surgeon: Murrell Drivers, MD;  Location: [facility];  Service: Orthopedics;  Laterality: Left;   VESICOVAGINAL FISTULA CLOSURE W/ TAH  2005   WRIST ARTHROSCOPY Left 03/13/2018   Procedure: ARTHROSCOPY LEFT WRIST WITH DEBRIDEMENT;  Surgeon: Murrell Drivers, MD;  Location: [facility];  Service: Orthopedics;  Laterality: Left;  block   Family History  Problem Relation Age of Onset   Colon polyps Mother    Asthma Mother    Ovarian cancer Mother    Varicose Veins Mother    Colon polyps Father    Heart disease Father    Peripheral vascular disease Father    Lung cancer Maternal Grandmother  Colon cancer Neg Hx    Esophageal cancer Neg Hx    Stomach cancer Neg Hx    Pancreatic cancer Neg Hx    Liver disease Neg Hx    Breast cancer Neg Hx    Social History   Socioeconomic History   Marital status: Divorced    Spouse name: Not on file   Number of children: 2   Years of education: Not on file   Highest education level: Associate degree: academic program  Occupational History   Occupation: Librarian, academic    Comment: disability    Employer: [employer]  Tobacco Use   Smoking status: Former    Current packs/day: 0.00    Average packs/day: 1 pack/day for 42.0 years (42.0 ttl pk-yrs)    Types: Cigarettes    Start date: 01/01/1980    Quit date: 12/31/2021    Years since quitting: 1.9    Passive exposure: Past   Smokeless tobacco: Never  Vaping Use   Vaping status: Never Used  Substance and Sexual Activity   Alcohol use: No    Alcohol/week: 0.0 standard drinks of alcohol   Drug use: No   Sexual activity: Not Currently    Birth control/protection: Surgical    Comment: 1st intercourse 63 yo-More than 5 partners  Other Topics  Concern   Not on file  Social History Narrative   Lives with son, daughter in law and 4 grandchildren    Social Drivers of Health   Financial Resource Strain: Low Risk  (12/15/2023)   Overall Financial Resource Strain (CARDIA)    Difficulty of Paying Living Expenses: Not very hard  Food Insecurity: Food Insecurity Present (12/15/2023)   Hunger Vital Sign    Worried About Running Out of Food in the Last Year: Sometimes true    Ran Out of Food in the Last Year: Never true  Transportation Needs: No Transportation Needs (12/15/2023)   PRAPARE - Administrator, Civil Service (Medical): No    Lack of Transportation (Non-Medical): No  Physical Activity: Insufficiently Active (12/15/2023)   Exercise Vital Sign    Days of Exercise per Week: 3 days    Minutes of Exercise per Session: 30 min  Stress: Stress Concern Present (12/15/2023)   [employer] of Occupational Health - Occupational Stress Questionnaire    Feeling of Stress: To some extent  Social Connections: Moderately Isolated (12/15/2023)   Social Connection and Isolation Panel    Frequency of Communication with Friends and Family: More than three times a week    Frequency of Social Gatherings with Friends and Family: Three times a week    Attends Religious Services: 1 to 4 times per year    Active Member of Clubs or Organizations: No    Attends Banker Meetings: Never    Marital Status: Divorced    Tobacco Counseling Counseling given: Yes    Clinical Intake:  Pre-visit preparation completed: Yes  Pain : No/denies pain     BMI - recorded: 29.47 Nutritional Status: BMI 25 -29 Overweight Nutritional Risks: None Diabetes: No  Lab Results  Component Value Date   HGBA1C 5.8 07/06/2023   HGBA1C 5.9 01/06/2023   HGBA1C 5.9 (H) 01/02/2022     How often do you need to have someone help you when you read instructions, pamphlets, or other written materials from your doctor or pharmacy?: 1 -  Never  Interpreter Needed?: No  Information entered by :: Verdie Saba, CMA   Activities of Daily  Living     12/15/2023    3:18 PM 12/11/2023    6:27 PM  In your present state of health, do you have any difficulty performing the following activities:  Hearing? 0 0  Vision? 0 0  Difficulty concentrating or making decisions? 1 1  Walking or climbing stairs? 0 0  Dressing or bathing? 0 0  Doing errands, shopping? 0 0  Preparing Food and eating ? N N  Using the Toilet? N N  In the past six months, have you accidently leaked urine? Y Y  Do you have problems with loss of bowel control? N N  Managing your Medications? N N  Managing your Finances? N N  Housekeeping or managing your Housekeeping? N N    Patient Care Team: Rollene Almarie LABOR, MD as PCP - General (Internal Medicine) Santo Stanly LABOR, MD as PCP - Cardiology (Cardiology) Vannie Aliene KIDD, MD (Inactive) as Referring Physician [facility])  I have updated your Care Teams any recent Medical Services you may have received from other providers in the past year.     Assessment:   This is a routine wellness examination for Crystal Duarte.  Hearing/Vision screen Hearing Screening - Comments:: Denies hearing difficulties   Vision Screening - Comments:: Wears rx glasses - up to date with routine eye exams with [facility]   Goals Addressed               This Visit's Progress     Patient Stated (pt-stated)        Patient stated she plans to continue exercising       Depression Screen     12/15/2023    3:20 PM 08/18/2023   11:28 AM 07/06/2023    2:00 PM 12/16/2021    1:06 PM 10/15/2021    1:18 PM 06/04/2021    1:08 PM 05/21/2021    1:21 PM  PHQ 2/9 Scores  PHQ - 2 Score 0 0 0 1 0    PHQ- 9 Score 0 0  5 3       Information is confidential and restricted. Go to Review Flowsheets to unlock data.    Fall Risk     12/15/2023    3:19 PM 12/11/2023    6:27 PM 08/18/2023   11:27 AM 07/06/2023    2:00 PM  05/22/2023    2:23 PM  Fall Risk   Falls in the past year? 1 1 0 1 0  Number falls in past yr: 0 1 0 1 0  Comment 1      Injury with Fall? 0 1 0 1 0  Risk for fall due to :    Impaired balance/gait   Follow up Falls evaluation completed;Falls prevention discussed  Falls evaluation completed Falls evaluation completed     MEDICARE RISK AT HOME:  Medicare Risk at Home Any stairs in or around the home?: No If so, are there any without handrails?: No Home free of loose throw rugs in walkways, pet beds, electrical cords, etc?: Yes Adequate lighting in your home to reduce risk of falls?: Yes Life alert?: No Use of a cane, walker or w/c?: No Grab bars in the bathroom?: Yes Shower chair or bench in shower?: No Elevated toilet seat or a handicapped toilet?: No  TIMED UP AND GO:  Was the test performed?  No  Cognitive Function: 6CIT completed    11/20/2017   10:24 AM  MMSE - Mini Mental State Exam  Orientation to time  Orientation to Place   Registration   Attention/ Calculation   Recall   Language- name 2 objects   Language- repeat   Language- follow 3 step command   Language- read & follow direction   Write a sentence   Copy design   Total score      Information is confidential and restricted. Go to Review Flowsheets to unlock data.        12/15/2023    3:25 PM  6CIT Screen  What Year? 0 points  What month? 0 points  What time? 0 points  Count back from 20 0 points  Months in reverse 0 points  Repeat phrase 0 points  Total Score 0 points    Immunizations Immunization History  Administered Date(s) Administered   Influenza, Seasonal, Injecte, Preservative Fre 01/06/2023   Influenza,inj,Quad PF,6+ Mos 03/14/2016, 12/26/2016, 01/09/2018, 01/21/2019, 01/13/2020, 01/15/2022   Influenza,inj,quad, With Preservative 02/11/2015   Influenza-Unspecified 02/04/2021   Moderna Covid-19 Fall Seasonal Vaccine 33yrs & older 03/10/2022, 01/06/2023   Moderna SARS-COV2 Booster  Vaccination 01/06/2023   Moderna Sars-Covid-2 Vaccination 05/16/2019, 06/12/2019, 03/08/2020   PNEUMOCOCCAL CONJUGATE-20 03/25/2021   PPD Test 06/03/2021, 10/07/2021   Tdap 10/08/2015   Zoster Recombinant(Shingrix ) 01/09/2018, 04/05/2018    Screening Tests Health Maintenance  Topic Date Due   Influenza Vaccine  11/03/2023   COVID-19 Vaccine (6 - 2025-26 season) 12/04/2023   Lung Cancer Screening  04/30/2024   Medicare Annual Wellness (AWV)  12/14/2024   DTaP/Tdap/Td (2 - Td or Tdap) 10/07/2025   Mammogram  11/19/2025   Colonoscopy  10/17/2030   Pneumococcal Vaccine: 50+ Years  Completed   Hepatitis C Screening  Completed   HIV Screening  Completed   Zoster Vaccines- Shingrix   Completed   Hepatitis B Vaccines 19-59 Average Risk  Aged Out   HPV VACCINES  Aged Out   Meningococcal B Vaccine  Aged Out    Health Maintenance Items Addressed:  12/15/2023  Additional Screening:  Vision Screening: Recommended annual ophthalmology exams for early detection of glaucoma and other disorders of the eye. Is the patient up to date with their annual eye exam?  Yes  Who is the provider or what is the name of the office in which the patient attends annual eye exams? [facility]  Dental Screening: Recommended annual dental exams for proper oral hygiene  Community Resource Referral / Chronic Care Management: CRR required this visit?  No   CCM required this visit?  No   Plan:    I have personally reviewed and noted the following in the patient's chart:   Medical and social history Use of alcohol, tobacco or illicit drugs  Current medications and supplements including opioid prescriptions. Patient is not currently taking opioid prescriptions. Functional ability and status Nutritional status Physical activity Advanced directives List of other physicians Hospitalizations, surgeries, and ER visits in previous 12 months Vitals Screenings to include cognitive, depression, and  falls Referrals and appointments  In addition, I have reviewed and discussed with patient certain preventive protocols, quality metrics, and best practice recommendations. A written personalized care plan for preventive services as well as general preventive health recommendations were provided to patient.   Verdie CHRISTELLA Saba, CMA   12/15/2023   After Visit Summary: (MyChart) Due to this being a telephonic visit, the after visit summary with patients personalized plan was offered to patient via MyChart   Notes: Nothing significant to report at this time.

## 2023-12-15 NOTE — Patient Instructions (Addendum)
 Crystal Duarte,  Thank you for taking the time for your Medicare Wellness Visit. I appreciate your continued commitment to your health goals. Please review the care plan we discussed, and feel free to reach out if I can assist you further.  Medicare recommends these wellness visits once per year to help you and your care team stay ahead of potential health issues. These visits are designed to focus on prevention, allowing your provider to concentrate on managing your acute and chronic conditions during your regular appointments.  Please note that Annual Wellness Visits do not include a physical exam. Some assessments may be limited, especially if the visit was conducted virtually. If needed, we may recommend a separate in-person follow-up with your provider.  Ongoing Care Seeing your primary care provider every 3 to 6 months helps us  monitor your health and provide consistent, personalized care.   Referrals If a referral was made during today's visit and you haven't received any updates within two weeks, please contact the referred provider directly to check on the status.  Recommended Screenings:  Health Maintenance  Topic Date Due   Flu Shot  11/03/2023   COVID-19 Vaccine (6 - 2025-26 season) 12/04/2023   Screening for Lung Cancer  04/30/2024   Medicare Annual Wellness Visit  12/14/2024   DTaP/Tdap/Td vaccine (2 - Td or Tdap) 10/07/2025   Breast Cancer Screening  11/19/2025   Colon Cancer Screening  10/17/2030   Pneumococcal Vaccine for age over 27  Completed   Hepatitis C Screening  Completed   HIV Screening  Completed   Zoster (Shingles) Vaccine  Completed   Hepatitis B Vaccine  Aged Out   HPV Vaccine  Aged Out   Meningitis B Vaccine  Aged Out       12/15/2023    3:15 PM  Advanced Directives  Does Patient Have a Medical Advance Directive? Yes  Type of Estate agent of [place];Living will  Does patient want to make changes to medical advance directive? No  - Patient declined  Copy of Healthcare Power of Attorney in Chart? Yes - validated most recent copy scanned in chart (See row information)   Advance Care Planning is important because it: Ensures you receive medical care that aligns with your values, goals, and preferences. Provides guidance to your family and loved ones, reducing the emotional burden of decision-making during critical moments.  Vision: Annual vision screenings are recommended for early detection of glaucoma, cataracts, and diabetic retinopathy. These exams can also reveal signs of chronic conditions such as diabetes and high blood pressure.  Dental: Annual dental screenings help detect early signs of oral cancer, gum disease, and other conditions linked to overall health, including heart disease and diabetes.

## 2023-12-15 NOTE — Telephone Encounter (Signed)
 Copied from CRM #[id number]. Topic: Clinical - Medication Question >> Dec 15, 2023  3:38 PM Corin V wrote: Reason for CRM: Patient would like her Covid vaccine script sent [facility] [phone] - [place], [place] - [address]

## 2023-12-18 NOTE — Telephone Encounter (Signed)
 Called patient and informed her of this.

## 2023-12-18 NOTE — Telephone Encounter (Signed)
 Thanks to new standing orders from our governor she should be able to go to pharmacy and get

## 2023-12-27 ENCOUNTER — Other Ambulatory Visit: Payer: Self-pay | Admitting: Internal Medicine

## 2023-12-29 ENCOUNTER — Encounter: Payer: Self-pay | Admitting: Radiology

## 2023-12-29 ENCOUNTER — Ambulatory Visit (INDEPENDENT_AMBULATORY_CARE_PROVIDER_SITE_OTHER): Admitting: Radiology

## 2023-12-29 VITALS — BP 108/66 | HR 73 | Ht 63.0 in | Wt 173.0 lb

## 2023-12-29 DIAGNOSIS — Z780 Asymptomatic menopausal state: Secondary | ICD-10-CM

## 2023-12-29 DIAGNOSIS — Z9189 Other specified personal risk factors, not elsewhere classified: Secondary | ICD-10-CM | POA: Diagnosis not present

## 2023-12-29 DIAGNOSIS — Z01419 Encounter for gynecological examination (general) (routine) without abnormal findings: Secondary | ICD-10-CM

## 2023-12-29 DIAGNOSIS — Z7251 High risk heterosexual behavior: Secondary | ICD-10-CM | POA: Diagnosis not present

## 2023-12-29 DIAGNOSIS — M8580 Other specified disorders of bone density and structure, unspecified site: Secondary | ICD-10-CM

## 2023-12-29 NOTE — Progress Notes (Signed)
   Crystal Duarte 1960-11-29 [id number]   History:  63 y.o. G2P2 presents for breast and pelvic exam.  Gynecologic History Hysterectomy  Sexually active: yes  Health Maintenance Last Pap: 2023. Results were: normal Last mammogram: 11/28/23. Results were: normal Last colonoscopy: 2022 DEXA: 2020 osteopenia  Risk Factors for Medicare Patients >/= 5 sexual partners in a lifetime: Yes First intercourse <73 years of age: Yes H/O STD at any age: No Abnormal pap smear, < 3 negative paps within the last 7 years: No DES exposure (women born between [phone]): No Patient is on post breast cancer medication like Femara or, if medication like this is not needed, 5 years post breast cancer diagnosis: No   Past medical history, past surgical history, family history and social history were all reviewed and documented in the EPIC chart.  ROS:  A ROS was performed and pertinent positives and negatives are included.  Exam:  Vitals:   12/29/23 0952  BP: 108/66  Pulse: 73  SpO2: 97%  Weight: 173 lb (78.5 kg)  Height: 5' 3 (1.6 m)   Body mass index is 30.65 kg/m.  General appearance:  Normal Thyroid :  Symmetrical, normal in size, without palpable masses or nodularity. Respiratory  Auscultation:  Clear without wheezing or rhonchi Cardiovascular  Auscultation:  Regular rate, without rubs, murmurs or gallops  Edema/varicosities:  Not grossly evident Abdominal  Soft,nontender, without masses, guarding or rebound.  Liver/spleen:  No organomegaly noted  Hernia:  None appreciated  Skin  Inspection:  Grossly normal Breasts: Examined lying and sitting.   Right: Without masses, retractions, nipple discharge or axillary adenopathy.   Left: Without masses, retractions, nipple discharge or axillary adenopathy. Genitourinary   Inguinal/mons:  Normal without inguinal adenopathy  External genitalia:  Normal appearing vulva with no masses, tenderness, or lesions  BUS/Urethra/Skene's glands:   Normal  Vagina:  Normal appearing with normal color and discharge, no lesions. Atrophy mild  Cervix:  absent  Uterus:  absent  Adnexa/parametria:     Rt: Normal in size, without masses or tenderness.   Lt: Normal in size, without masses or tenderness.  Anus and perineum: Normal   Crystal Duarte, Crystal Duarte present for exam  Assessment/Plan:   1. Encounter for breast and pelvic examination (Primary)  2. Osteopenia after menopause   Return in 1 year for High risk B&P   Crystal Duarte B WHNP-BC 9:58 AM 12/29/2023

## 2024-01-08 DIAGNOSIS — M5412 Radiculopathy, cervical region: Secondary | ICD-10-CM | POA: Diagnosis not present

## 2024-01-09 ENCOUNTER — Encounter: Admitting: Internal Medicine

## 2024-01-10 DIAGNOSIS — R0609 Other forms of dyspnea: Secondary | ICD-10-CM | POA: Diagnosis not present

## 2024-01-10 DIAGNOSIS — J441 Chronic obstructive pulmonary disease with (acute) exacerbation: Secondary | ICD-10-CM | POA: Diagnosis not present

## 2024-01-10 NOTE — Progress Notes (Signed)
 HPI F former smoker (10.5 pk yrs) followed  for COPD, complicated by  aortic atherosclerosis, multiple pulmonary nodules, bipolar disorder. Hx Coccidioidomycosis,(lung bx [facility] 2001) Depression, dCHF,  PFT 03/25/21-severe obstruction with no response to bronchodilator (FEV1 1.23/46%, FVC 57%), moderate restriction, minimal diffusion defect a1AT 03/10/22- 122 Pi MM  WNL Walk test on room air 03/10/2022-3 laps with lowest saturation 93% maximum heart rate 99 Lab- 03/29/22- EOS 0.7, a1AT- PiMM 144 WNL,  =======================================================================   10/17/22-  62 yoF former smoker (10.5 pk yrs) followed  for COPD, complicated by  aortic Atherosclerosis, hx multiple pulmonary Nodules, Bipolar disorder. Hx Coccidioidomycosis,(lung bx [facility] 2001) Depression, dCHF, VitD Deficiency  -Neb Duoneb, Ventolin  hfa, Trelegy 100, Singulair ,  LOV Dr Shellia 07/26/22- COPD exacerbation > levaquin , pred taper, CXR O2 2L sleep [employer] Lung nodules not notable on CT in January or CXR in April. -----SOB with the weather Medications reviewed.  She seems to be feeling more stable now. She is concerned about how to get oxygen  for sleep while out of town.  She would like to reduce the bulk of backup oxygen  cylinders standing around in her home and will discuss that with [employer]. CXR 07/26/22 IMPRESSION: No acute cardiopulmonary process.  01/11/24-  59 yoF former smoker (10.5 pk yrs) followed  for COPD, complicated by  aortic Atherosclerosis, hx multiple pulmonary Nodules, Bipolar disorder. Hx Coccidioidomycosis,(lung bx [facility] 2001) Depression, dCHF, VitD Deficiency  -Neb Duoneb, Ventolin  hfa, Trelegy 100, Singulair ,  LOV Dr Shellia 07/26/22- COPD exacerbation > levaquin , pred taper, CXR O2 2L sleep [employer] Lung nodules not notable on CT in January or CXR in April. -----SOB worse at night Discussed the use of AI scribe software for clinical note transcription with the patient,  who gave verbal consent to proceed.  History of Present Illness   Crystal Duarte is a 63 year old female with COPD who presents with increased shortness of breath and difficulty with physical activities.  She experiences increased dyspnea during routine activities such as climbing stairs, grocery shopping, and walking to her grandkids' sports fields. Her condition worsened after a COVID-19 hospitalization and ICU stay, impacting her respiratory health. She uses a nebulizer, Trelegy, albuterol , and Singulair  for COPD management. She has a dry cough and frequently clears her throat, which sometimes becomes raspy. She manages reflux with acid blocker and sleeps on an elevated bed to alleviate symptoms. She usually does well when up during the day. On home O2 2L for sleep, still occ notes oximetry into 80's at night. We will update PFT and allow home O2 up to 3L for sleep.     CT chest LDscreen 05/01/23 MPRESSION: 1. Lung-RADS 2, benign appearance or behavior. Continue annual screening with low-dose chest CT without contrast in 12 months. 2. Similar diffuse circumferential bronchial wall thickening with new areas of peripheral airway impaction in the right upper lobe, right middle lobe, and both lower lobes suggesting infectious/inflammatory etiology. 3. Emphysema (ICD10-J43.9) and Aortic Atherosclerosis (ICD10-170.0)     ROS-see HPI   + = positive Constitutional:    weight loss, night sweats, fevers, chills, fatigue, lassitude. HEENT:    headaches, difficulty swallowing, tooth/dental problems, sore throat,       sneezing, itching, ear ache, nasal congestion, post nasal drip, snoring CV:    chest pain, orthopnea, PND, swelling in lower extremities, anasarca,  dizziness, palpitations Resp:   shortness of breath with exertion or at rest.                productive cough,   non-productive cough, coughing up of blood.              change in color of mucus.   wheezing.   Skin:    rash or lesions. GI:  No-   heartburn, indigestion, abdominal pain, nausea, vomiting, diarrhea,                 change in bowel habits, loss of appetite GU: dysuria, change in color of urine, no urgency or frequency.   flank pain. MS:   joint pain, stiffness, decreased range of motion, back pain. Neuro-     nothing unusual Psych:  change in mood or affect.  depression or anxiety.   memory loss.  OBJ- Physical Exam General- Alert, Oriented, Affect-appropriate, Distress- none acute Skin- rash-none, lesions- none, excoriation- none Lymphadenopathy- none Head- atraumatic            Eyes- Gross vision intact, PERRLA, conjunctivae and secretions clear            Ears- Hearing, canals-normal            Nose- Clear, no-Septal dev, mucus, polyps, erosion, perforation             Throat- Mallampati II , mucosa clear , drainage- none, tonsils- atrophic Neck- flexible , trachea midline, no stridor , thyroid  nl, carotid no bruit Chest - symmetrical excursion , unlabored           Heart/CV- RRR , no murmur , no gallop  , no rub, nl s1 s2                           - JVD- none , edema- none, stasis changes- none, varices- none           Lung- clear to P&A, wheeze- none, cough- none , dullness-none, rub- none           Chest wall-  Abd-  Br/ Gen/ Rectal- Not done, not indicated Extrem- cyanosis- none, clubbing, none, atrophy- none, strength- nl Neuro- grossly intact to observation   Assessment and Plan:    Chronic respiratory failure with hypoxia and chronic obstructive pulmonary disease (COPD mixed type) Increased dyspnea with activities. Previous COVID-19 and age-related decline may contribute. Cardiac issues considered. No anemia detected. - Increase oxygen  use at night. - Order pulmonary function test for comparison with previous test. - Provide form for handicap parking permit. - Schedule follow-up in four months.  Gastroesophageal reflux disease (GERD) Throat  clearing and raspy voice possibly related to GERD. Current omeprazole  reduces acid but not reflux. - Continue current GERD management but discuss with PCP. - Monitor for reflux symptoms.

## 2024-01-11 ENCOUNTER — Ambulatory Visit: Admitting: Internal Medicine

## 2024-01-11 ENCOUNTER — Encounter: Payer: Self-pay | Admitting: Internal Medicine

## 2024-01-11 VITALS — BP 116/66 | HR 87 | Temp 98.1°F | Ht 65.0 in | Wt 175.8 lb

## 2024-01-11 DIAGNOSIS — Z87891 Personal history of nicotine dependence: Secondary | ICD-10-CM | POA: Diagnosis not present

## 2024-01-11 DIAGNOSIS — Z8616 Personal history of COVID-19: Secondary | ICD-10-CM

## 2024-01-11 DIAGNOSIS — J9611 Chronic respiratory failure with hypoxia: Secondary | ICD-10-CM

## 2024-01-11 DIAGNOSIS — J449 Chronic obstructive pulmonary disease, unspecified: Secondary | ICD-10-CM

## 2024-01-11 DIAGNOSIS — K219 Gastro-esophageal reflux disease without esophagitis: Secondary | ICD-10-CM | POA: Diagnosis not present

## 2024-01-11 DIAGNOSIS — R918 Other nonspecific abnormal finding of lung field: Secondary | ICD-10-CM

## 2024-01-11 NOTE — Patient Instructions (Signed)
 Order- Dispensing optician- schedule PFT  dx chronic respiratory failure with hypoxia   Recommend- set your home oxygen  concentrator at 2 or 3 L while sleeping, to keep O2 saturation mostly in 90-95% range

## 2024-01-15 ENCOUNTER — Encounter: Payer: Self-pay | Admitting: Internal Medicine

## 2024-01-16 ENCOUNTER — Ambulatory Visit (INDEPENDENT_AMBULATORY_CARE_PROVIDER_SITE_OTHER)

## 2024-01-16 DIAGNOSIS — J9611 Chronic respiratory failure with hypoxia: Secondary | ICD-10-CM | POA: Diagnosis not present

## 2024-01-16 LAB — PULMONARY FUNCTION TEST
DL/VA % pred: 105 %
DL/VA: 4.39 ml/min/mmHg/L
DLCO unc % pred: 79 %
DLCO unc: 16.42 ml/min/mmHg
FEF 25-75 Post: 0.86 L/s
FEF 25-75 Pre: 0.88 L/s
FEF2575-%Change-Post: -1 %
FEF2575-%Pred-Post: 38 %
FEF2575-%Pred-Pre: 39 %
FEV1-%Change-Post: -1 %
FEV1-%Pred-Post: 61 %
FEV1-%Pred-Pre: 62 %
FEV1-Post: 1.56 L
FEV1-Pre: 1.59 L
FEV1FVC-%Change-Post: -5 %
FEV1FVC-%Pred-Pre: 83 %
FEV6-%Change-Post: -1 %
FEV6-%Pred-Post: 76 %
FEV6-%Pred-Pre: 77 %
FEV6-Post: 2.44 L
FEV6-Pre: 2.47 L
FEV6FVC-%Change-Post: -1 %
FEV6FVC-%Pred-Post: 102 %
FEV6FVC-%Pred-Pre: 103 %
FVC-%Change-Post: 3 %
FVC-%Pred-Post: 77 %
FVC-%Pred-Pre: 74 %
FVC-Post: 2.55 L
FVC-Pre: 2.47 L
Post FEV1/FVC ratio: 61 %
Post FEV6/FVC ratio: 99 %
Pre FEV1/FVC ratio: 64 %
Pre FEV6/FVC Ratio: 100 %
RV % pred: 168 %
RV: 3.58 L
TLC % pred: 113 %
TLC: 5.93 L

## 2024-01-16 NOTE — Patient Instructions (Signed)
 Full PFT performed today.

## 2024-01-16 NOTE — Progress Notes (Signed)
 Full PFT performed today.

## 2024-01-17 ENCOUNTER — Other Ambulatory Visit: Payer: Self-pay | Admitting: Internal Medicine

## 2024-01-24 ENCOUNTER — Telehealth: Payer: Self-pay | Admitting: Internal Medicine

## 2024-01-24 NOTE — Telephone Encounter (Signed)
 Pt is scheduled for rov with Dr. Neysa tomorrow 01/25/24 She was just seen 01/11/24 and advised to have PFT and then f/u with CY in 4 months which would be Feb 2025  I am unsure why she is coming back sooner and called her to see if she can reschedule to Feb which was original rec She did not answer- left detailed msg to call back about rescheduling ov

## 2024-01-24 NOTE — Telephone Encounter (Signed)
 Patient is aware.NFN

## 2024-01-24 NOTE — Telephone Encounter (Signed)
 PFT- showed moderate obstructive airways diseaseCOPD. Actually score looked better this time, than when last checked in 2022. That indicates it is not getting worse, which is good. We can continue current management.

## 2024-01-24 NOTE — Telephone Encounter (Signed)
 Copied from CRM #[id number]. Topic: Clinical - Lab/Test Results >> Jan 24, 2024  9:40 AM Crystal Duarte wrote: Reason for CRM: Pt has an appt 10/23 with Dr. Neysa to review PFT results, but she was asked to reschedule in Feb. Provider is retiring at the end of the year. Pt is okay with just going over the results of the PFT with clinical staff if possible and to cancel the follow up appt. If possible, she would like Dr. Neysa to speak with her regarding the results as he will be gone in a couple months.  [id number] ok to leave a vm.   Dr. Neysa can you advise if PFT results.

## 2024-01-24 NOTE — Telephone Encounter (Signed)
 Appt rescheduled- pt will see Dr. Zaida on 05/29/23

## 2024-01-25 ENCOUNTER — Ambulatory Visit: Admitting: Internal Medicine

## 2024-01-25 ENCOUNTER — Other Ambulatory Visit: Payer: Self-pay | Admitting: Internal Medicine

## 2024-01-25 ENCOUNTER — Other Ambulatory Visit: Payer: Self-pay | Admitting: Adult Health

## 2024-01-25 DIAGNOSIS — F331 Major depressive disorder, recurrent, moderate: Secondary | ICD-10-CM

## 2024-01-30 ENCOUNTER — Encounter: Payer: Self-pay | Admitting: Internal Medicine

## 2024-01-30 ENCOUNTER — Ambulatory Visit (INDEPENDENT_AMBULATORY_CARE_PROVIDER_SITE_OTHER): Admitting: Internal Medicine

## 2024-01-30 VITALS — BP 110/80 | HR 78 | Temp 98.5°F | Ht 65.0 in | Wt 177.0 lb

## 2024-01-30 DIAGNOSIS — J441 Chronic obstructive pulmonary disease with (acute) exacerbation: Secondary | ICD-10-CM | POA: Diagnosis not present

## 2024-01-30 DIAGNOSIS — E781 Pure hyperglyceridemia: Secondary | ICD-10-CM | POA: Diagnosis not present

## 2024-01-30 DIAGNOSIS — J9611 Chronic respiratory failure with hypoxia: Secondary | ICD-10-CM

## 2024-01-30 DIAGNOSIS — R739 Hyperglycemia, unspecified: Secondary | ICD-10-CM | POA: Diagnosis not present

## 2024-01-30 DIAGNOSIS — Z0001 Encounter for general adult medical examination with abnormal findings: Secondary | ICD-10-CM

## 2024-01-30 LAB — LIPID PANEL
Cholesterol: 104 mg/dL (ref 0–200)
HDL: 55.5 mg/dL (ref >39.00)
LDL Cholesterol: 31 mg/dL (ref 0–99)
NonHDL: 48.26
Total CHOL/HDL Ratio: 2
Triglycerides: 87 mg/dL (ref 0.0–149.0)
VLDL: 17.4 mg/dL (ref 0.0–40.0)

## 2024-01-30 LAB — COMPREHENSIVE METABOLIC PANEL WITH GFR
ALT: 19 U/L (ref 0–35)
AST: 18 U/L (ref 0–37)
Albumin: 4 g/dL (ref 3.5–5.2)
Alkaline Phosphatase: 73 U/L (ref 39–117)
BUN: 8 mg/dL (ref 6–23)
CO2: 31 meq/L (ref 19–32)
Calcium: 9 mg/dL (ref 8.4–10.5)
Chloride: 104 meq/L (ref 96–112)
Creatinine, Ser: 0.68 mg/dL (ref 0.40–1.20)
GFR: 91.93 mL/min (ref >60.00)
Glucose, Bld: 99 mg/dL (ref 70–99)
Potassium: 4.3 meq/L (ref 3.5–5.1)
Sodium: 141 meq/L (ref 135–145)
Total Bilirubin: 0.4 mg/dL (ref 0.2–1.2)
Total Protein: 6.4 g/dL (ref 6.0–8.3)

## 2024-01-30 LAB — HEMOGLOBIN A1C: Hgb A1c MFr Bld: 5.9 % (ref 4.6–6.5)

## 2024-01-30 LAB — CBC
HCT: 39.2 % (ref 36.0–46.0)
Hemoglobin: 13.1 g/dL (ref 12.0–15.0)
MCHC: 33.3 g/dL (ref 30.0–36.0)
MCV: 95 fl (ref 78.0–100.0)
Platelets: 238 K/uL (ref 150.0–400.0)
RBC: 4.13 Mil/uL (ref 3.87–5.11)
RDW: 13.4 % (ref 11.5–15.5)
WBC: 9.3 K/uL (ref 4.0–10.5)

## 2024-01-30 LAB — POC COVID19 BINAXNOW: SARS Coronavirus 2 Ag: NEGATIVE

## 2024-01-30 MED ORDER — PREDNISONE 20 MG PO TABS: 40.0000 mg | ORAL_TABLET | Freq: Every day | ORAL | 0 refills | Status: AC

## 2024-01-30 MED ORDER — ROSUVASTATIN CALCIUM 20 MG PO TABS: 20.0000 mg | ORAL_TABLET | Freq: Every day | ORAL | 1 refills | Status: AC

## 2024-01-30 MED ORDER — AZITHROMYCIN 250 MG PO TABS: ORAL_TABLET | ORAL | 0 refills | Status: AC

## 2024-01-30 MED ORDER — DOXYCYCLINE HYCLATE 100 MG PO TABS: 100.0000 mg | ORAL_TABLET | Freq: Two times a day (BID) | ORAL | 0 refills | Status: DC

## 2024-01-30 MED ORDER — VALACYCLOVIR HCL 1 G PO TABS
1000.0000 mg | ORAL_TABLET | Freq: Two times a day (BID) | ORAL | 6 refills | Status: AC
Start: 2024-01-30 — End: ?

## 2024-01-30 NOTE — Progress Notes (Signed)
   Subjective:   Patient ID: Crystal Duarte, female    DOB: 03-Aug-1960, 63 y.o.   MRN: [id number]  The patient is here for physical. Pertinent topics discussed: Discussed the use of AI scribe software for clinical note transcription with the patient, who gave verbal consent to proceed.  History of Present Illness Crystal Duarte is a 63 year old female with COPD who presents with increased congestion and chest tightness.  She has experienced increased congestion and chest tightness over the past four to five days without associated fever or chills. She describes her symptoms as feeling 'just stuffed up' and notes a possible increase in coughing. She has been using her albuterol  inhaler more frequently and has also been using her nebulizer and oxygen  at night.  She monitors her oxygen  levels with an Apple Watch and reports that she has been running low. No new chest pain, but she acknowledges the tightness in her chest. No new gastrointestinal symptoms such as diarrhea, constipation, or blood in stools. She also denies any new muscle or joint issues, stating that she experiences 'the normal stuff.'  Regarding her skin, she mentions a rash that appeared on her arms while at a soccer game, which did not itch but was noticeable. She suspects it might be related to sun exposure or photosensitivity. She also notes that her skin bruises easily and feels thin, a condition she relates to her mother's similar skin issues. She has an upcoming dermatology appointment in December for a skin check.  She works in an assisted living facility and is aware of the potential for exposure to various illnesses. She has received all her vaccinations in preparation for the winter season.  PM[facility], [facility], social history reviewed and updated  Review of Systems  Objective:  Physical Exam  Vitals:   01/30/24 1307  BP: 110/80  Pulse: 78  Temp: 98.5 F (36.9 C)  TempSrc: Oral  SpO2: 96%  Weight: 177 lb (80.3  kg)  Height: 5' 5 (1.651 m)    Assessment & Plan:

## 2024-01-30 NOTE — Patient Instructions (Signed)
 We have sent in prednisone  and doxycycline  to take for the infection. Your covid-19 test is negative.

## 2024-02-01 ENCOUNTER — Other Ambulatory Visit: Payer: Self-pay | Admitting: Internal Medicine

## 2024-02-01 NOTE — Assessment & Plan Note (Signed)
 She will continue to monitor via apple watch. Continue trelegy.

## 2024-02-01 NOTE — Assessment & Plan Note (Signed)
Flu shot up to date. Pneumonia up to date. Shingrix complete. Tetanus up to date. Colonoscopy up to date. Mammogram up to date, pap smear up to date. Counseled about sun safety and mole surveillance. Counseled about the dangers of distracted driving. Given 10 year screening recommendations.

## 2024-02-01 NOTE — Assessment & Plan Note (Signed)
 Checking HgA1c and adjust as needed.

## 2024-02-01 NOTE — Assessment & Plan Note (Signed)
 POC Covid-19 testing done as in interval for treatment. This is negative. She will be treated with azithromycin  and prednisone  for early COPD flare as she does have moderate disease and will continue trelegy and albuterol  prn inhaler and nebulizer.

## 2024-02-02 ENCOUNTER — Ambulatory Visit: Payer: Self-pay | Admitting: Internal Medicine

## 2024-02-05 ENCOUNTER — Other Ambulatory Visit: Payer: Self-pay | Admitting: Adult Health

## 2024-02-05 DIAGNOSIS — F331 Major depressive disorder, recurrent, moderate: Secondary | ICD-10-CM

## 2024-02-10 DIAGNOSIS — R0609 Other forms of dyspnea: Secondary | ICD-10-CM | POA: Diagnosis not present

## 2024-02-10 DIAGNOSIS — J441 Chronic obstructive pulmonary disease with (acute) exacerbation: Secondary | ICD-10-CM | POA: Diagnosis not present

## 2024-02-11 ENCOUNTER — Other Ambulatory Visit: Payer: Self-pay | Admitting: Adult Health

## 2024-02-11 DIAGNOSIS — F331 Major depressive disorder, recurrent, moderate: Secondary | ICD-10-CM

## 2024-02-14 ENCOUNTER — Other Ambulatory Visit: Payer: Self-pay | Admitting: Adult Health

## 2024-02-14 DIAGNOSIS — F331 Major depressive disorder, recurrent, moderate: Secondary | ICD-10-CM

## 2024-02-19 ENCOUNTER — Telehealth: Payer: Self-pay | Admitting: Internal Medicine

## 2024-02-19 NOTE — Telephone Encounter (Unsigned)
 Copied from CRM #[id number]. Topic: Clinical - Medication Refill >> Feb 19, 2024  3:47 PM Macario HERO wrote: Medication: omeprazole  (PRILOSEC) 20 MG capsule [[id number]]  Has the patient contacted their pharmacy? No (Agent: If no, request that the patient contact the pharmacy for the refill. If patient does not wish to contact the pharmacy document the reason why and proceed with request.) (Agent: If yes, when and what did the pharmacy advise?)  This is the patient's preferred pharmacy:  [id number] - [place], [place] - [address] [place] [address] Phone: [phone] Fax: [phone]  Is this the correct pharmacy for this prescription? Yes If no, delete pharmacy and type the correct one.   Has the prescription been filled recently? Yes  Is the patient out of the medication? Yes  Has the patient been seen for an appointment in the last year OR does the patient have an upcoming appointment? Yes  Can we respond through MyChart? Yes  Agent: Please be advised that Rx refills may take up to 3 business days. We ask that you follow-up with your pharmacy.

## 2024-02-21 NOTE — Telephone Encounter (Signed)
 Not seen since 2022 - should ask PCP about refill or schedule appointment w/ gastroenterology

## 2024-02-23 DIAGNOSIS — M5412 Radiculopathy, cervical region: Secondary | ICD-10-CM | POA: Diagnosis not present

## 2024-03-04 ENCOUNTER — Other Ambulatory Visit: Payer: Self-pay

## 2024-03-04 ENCOUNTER — Emergency Department (HOSPITAL_BASED_OUTPATIENT_CLINIC_OR_DEPARTMENT_OTHER)

## 2024-03-04 ENCOUNTER — Emergency Department (HOSPITAL_BASED_OUTPATIENT_CLINIC_OR_DEPARTMENT_OTHER)
Admission: EM | Admit: 2024-03-04 | Discharge: 2024-03-04 | Disposition: A | Discharge status: Home / Self Care | Source: Ambulatory Visit

## 2024-03-04 ENCOUNTER — Ambulatory Visit (HOSPITAL_BASED_OUTPATIENT_CLINIC_OR_DEPARTMENT_OTHER)
Admission: RE | Admit: 2024-03-04 | Discharge: 2024-03-04 | Disposition: A | Source: Ambulatory Visit | Attending: Radiology | Admitting: Radiology

## 2024-03-04 ENCOUNTER — Encounter (HOSPITAL_BASED_OUTPATIENT_CLINIC_OR_DEPARTMENT_OTHER): Payer: Self-pay | Admitting: Emergency Medicine

## 2024-03-04 ENCOUNTER — Other Ambulatory Visit: Payer: Self-pay | Admitting: Adult Health

## 2024-03-04 ENCOUNTER — Other Ambulatory Visit: Payer: Self-pay | Admitting: Internal Medicine

## 2024-03-04 DIAGNOSIS — R5383 Other fatigue: Secondary | ICD-10-CM | POA: Diagnosis not present

## 2024-03-04 DIAGNOSIS — R5381 Other malaise: Secondary | ICD-10-CM | POA: Diagnosis not present

## 2024-03-04 DIAGNOSIS — Z7952 Long term (current) use of systemic steroids: Secondary | ICD-10-CM | POA: Insufficient documentation

## 2024-03-04 DIAGNOSIS — R531 Weakness: Secondary | ICD-10-CM | POA: Diagnosis not present

## 2024-03-04 DIAGNOSIS — M791 Myalgia, unspecified site: Secondary | ICD-10-CM | POA: Insufficient documentation

## 2024-03-04 DIAGNOSIS — J449 Chronic obstructive pulmonary disease, unspecified: Secondary | ICD-10-CM | POA: Insufficient documentation

## 2024-03-04 DIAGNOSIS — Z780 Asymptomatic menopausal state: Secondary | ICD-10-CM | POA: Insufficient documentation

## 2024-03-04 DIAGNOSIS — M8589 Other specified disorders of bone density and structure, multiple sites: Secondary | ICD-10-CM | POA: Diagnosis not present

## 2024-03-04 DIAGNOSIS — F331 Major depressive disorder, recurrent, moderate: Secondary | ICD-10-CM

## 2024-03-04 DIAGNOSIS — M8580 Other specified disorders of bone density and structure, unspecified site: Secondary | ICD-10-CM | POA: Diagnosis not present

## 2024-03-04 DIAGNOSIS — D72829 Elevated white blood cell count, unspecified: Secondary | ICD-10-CM | POA: Insufficient documentation

## 2024-03-04 DIAGNOSIS — R0602 Shortness of breath: Secondary | ICD-10-CM | POA: Insufficient documentation

## 2024-03-04 DIAGNOSIS — R079 Chest pain, unspecified: Secondary | ICD-10-CM | POA: Diagnosis not present

## 2024-03-04 LAB — BASIC METABOLIC PANEL WITH GFR
Anion gap: 10 (ref 5–15)
BUN: 8 mg/dL (ref 8–23)
CO2: 26 mmol/L (ref 22–32)
Calcium: 9.5 mg/dL (ref 8.9–10.3)
Chloride: 103 mmol/L (ref 98–111)
Creatinine, Ser: 0.78 mg/dL (ref 0.44–1.00)
GFR, Estimated: >60 mL/min (ref >60)
Glucose, Bld: 89 mg/dL (ref 70–99)
Potassium: 3.9 mmol/L (ref 3.5–5.1)
Sodium: 140 mmol/L (ref 135–145)

## 2024-03-04 LAB — CBC
HCT: 42 % (ref 36.0–46.0)
Hemoglobin: 13.8 g/dL (ref 12.0–15.0)
MCH: 31.3 pg (ref 26.0–34.0)
MCHC: 32.9 g/dL (ref 30.0–36.0)
MCV: 95.2 fL (ref 80.0–100.0)
Platelets: 237 K/uL (ref 150–400)
RBC: 4.41 MIL/uL (ref 3.87–5.11)
RDW: 12.8 % (ref 11.5–15.5)
WBC: 10.8 K/uL — ABNORMAL HIGH (ref 4.0–10.5)
nRBC: 0 % (ref 0.0–0.2)

## 2024-03-04 LAB — RESP PANEL BY RT-PCR (RSV, FLU A&B, COVID)  RVPGX2
Influenza A by PCR: NEGATIVE
Influenza B by PCR: NEGATIVE
Resp Syncytial Virus by PCR: NEGATIVE
SARS Coronavirus 2 by RT PCR: NEGATIVE

## 2024-03-04 LAB — TROPONIN T, HIGH SENSITIVITY: Troponin T High Sensitivity: <15 ng/L (ref 0–19)

## 2024-03-04 MED ORDER — IPRATROPIUM-ALBUTEROL 0.5-2.5 (3) MG/3ML IN SOLN
3.0000 mL | Freq: Once | RESPIRATORY_TRACT | Status: AC
  Administered 2024-03-04: 3 mL via RESPIRATORY_TRACT
  Filled 2024-03-04: qty 3

## 2024-03-04 NOTE — Discharge Instructions (Addendum)
 It was a pleasure taking care of you today. You were seen in the Emergency Department for evaluation of fatigue and generalized malaise. Your work-up was reassuring. Your CT/Xray/Labs showed no acute abnormality to explain your symptoms.  You tested negative for the flu, COVID and RSV.  Your chest x-ray was unremarkable.  It is possible that this may be the start of a viral infection.  If that is the case, you may treat the symptoms as needed with Tylenol  or ibuprofen . Refer to the attached documentation for further management of your symptoms. Follow up with your PCP if your symptoms worsen.  Please return to the ER if you experience chest pain, trouble breathing, intractable nausea/vomiting or any other life threatening illnesses.

## 2024-03-04 NOTE — ED Provider Notes (Signed)
  EMERGENCY DEPARTMENT AT [facility] Provider Note   CSN: [id number] Arrival date & time: 03/04/24  1424     Patient presents with: Shortness of Breath and Weakness   Crystal Duarte is a 63 y.o. female with history of COPD, depression, anxiety, peripheral artery disease, hypertriglyceridemia, peripheral edema, pulmonary nodules who presents emergency department for evaluation of shortness of breath and muscle aches.  Patient states that she had a cervical epidural on 11/21 and thinks that her symptoms started around that time.  She endorses fatigue and malaise despite adequate sleep.  She does have a history of COPD, so is worried that she is developing pneumonia.  She denies having any sputum production, fevers or bodyaches.  She has been compliant with her COPD medication and has taken her inhalers as prescribed earlier today.   Shortness of Breath Weakness Associated symptoms: shortness of breath        Prior to Admission medications   Medication Sig Start Date End Date Taking? Authorizing Provider  albuterol  (VENTOLIN  HFA) 108 (90 Base) MCG/ACT inhaler Inhale 2 puffs into the lungs every 4 (four) hours as needed. 11/12/21   Cockerham, Alicia M, PA-C  busPIRone  (BUSPAR ) 10 MG tablet Take two tablets three times daily for anxiety. 02/08/23   Mozingo, Regina Nattalie, NP  Cholecalciferol (VITAMIN D3 PO) Take by mouth.    [provider]  Fluticasone -Umeclidin-Vilant (TRELEGY ELLIPTA ) 100-62.5-25 MCG/ACT AEPB Inhale 1 puff then rinse mouth, once daily 05/10/23   Neysa Rama D, MD  gabapentin  (NEURONTIN ) 400 MG capsule Take 1 capsule (400 mg total) by mouth 2 (two) times daily. 02/08/23   Mozingo, Regina Nattalie, NP  gabapentin  (NEURONTIN ) 600 MG tablet Take 1 tablet (600 mg total) by mouth at bedtime. 02/08/23   Mozingo, Regina Nattalie, NP  ipratropium-albuterol  (DUONEB) 0.5-2.5 (3) MG/3ML SOLN Take 3 mLs by nebulization every 6 (six) hours as needed.  05/16/22   Neysa Rama BIRCH, MD  montelukast  (SINGULAIR ) 10 MG tablet TAKE 1 TABLET BY MOUTH AT BEDTIME 01/17/24   Neysa Rama D, MD  Multiple Vitamin (MULTI-VITAMINS) TABS Take 1 tablet by mouth daily.     [provider]  nystatin  cream (MYCOSTATIN ) Apply 1 Application topically 2 (two) times daily. Patient not taking: Reported on 01/30/2024 05/22/23   Prentiss Annabella LABOR, NP  nystatin -triamcinolone  ointment (MYCOLOG) Apply 1 Application topically 2 (two) times daily. Patient not taking: Reported on 01/30/2024 08/18/23   Rollene Almarie LABOR, MD  omeprazole  (PRILOSEC) 20 MG capsule TAKE 1 CAPSULE BY MOUTH DAILY 09/25/23   Avram Lupita BRAVO, MD  predniSONE  (DELTASONE ) 20 MG tablet Take 2 tablets (40 mg total) by mouth daily with breakfast. 01/30/24   Rollene Almarie LABOR, MD  propranolol  (INDERAL ) 10 MG tablet Take 1 tablet (10 mg total) by mouth 3 (three) times daily. 02/08/23   Mozingo, Regina Nattalie, NP  rosuvastatin  (CRESTOR ) 20 MG tablet Take 1 tablet (20 mg total) by mouth daily. 01/30/24   Rollene Almarie LABOR, MD  traZODone  (DESYREL ) 100 MG tablet Take 2.5 tablets (250 mg total) by mouth at bedtime. 02/08/23   Mozingo, Regina Nattalie, NP  triamcinolone  ointment (KENALOG ) 0.5 % Apply 1 Application topically 2 (two) times daily. 08/03/23   Elnor Lauraine BRAVO, NP  valACYclovir  (VALTREX ) 1000 MG tablet Take 1 tablet (1,000 mg total) by mouth 2 (two) times daily. 01/30/24   Rollene Almarie LABOR, MD  venlafaxine  XR (EFFEXOR  XR) 75 MG 24 hr capsule Take 1 capsule (75 mg total) by mouth  daily with breakfast. 02/08/23   Mozingo, Regina Nattalie, NP  venlafaxine  XR (EFFEXOR -XR) 150 MG 24 hr capsule Take 1 capsule (150 mg total) by mouth daily with breakfast. 02/08/23   Mozingo, Regina Nattalie, NP    Allergies: Other, Procaine, Latuda [lurasidone hcl], and Sulfa antibiotics    Review of Systems  Respiratory:  Positive for shortness of breath.   Neurological:  Positive for weakness.     Updated Vital Signs BP (!) 125/90   Pulse 84   Temp 98.6 F (37 C)   Resp 20   Ht 5' 5 (1.651 m)   Wt 77.1 kg   SpO2 100%   BMI 28.29 kg/m   Physical Exam Vitals and nursing note reviewed.  Constitutional:      Appearance: Normal appearance.  HENT:     Head: Normocephalic and atraumatic.     Mouth/Throat:     Mouth: Mucous membranes are moist.  Eyes:     General: No scleral icterus.       Right eye: No discharge.        Left eye: No discharge.     Conjunctiva/sclera: Conjunctivae normal.  Cardiovascular:     Rate and Rhythm: Normal rate and regular rhythm.     Pulses: Normal pulses.  Pulmonary:     Effort: Pulmonary effort is normal.     Breath sounds: Decreased breath sounds present.     Comments: Patient with mildly decreased breath sounds on the right. Abdominal:     General: There is no distension.     Tenderness: There is no abdominal tenderness.  Musculoskeletal:        General: No deformity.     Cervical back: Normal range of motion.     Comments: No evidence of erythema, swelling or pus drainage at the area of the epidural site on patient's right shoulder  Skin:    General: Skin is warm and dry.     Capillary Refill: Capillary refill takes less than 2 seconds.  Neurological:     Mental Status: She is alert.     Motor: No weakness.  Psychiatric:        Mood and Affect: Mood normal.     (all labs ordered are listed, but only abnormal results are displayed) Labs Reviewed  CBC - Abnormal; Notable for the following components:      Result Value   WBC 10.8 (*)    All other components within normal limits  RESP PANEL BY RT-PCR (RSV, FLU A&B, COVID)  [id number]  BASIC METABOLIC PANEL WITH GFR  TROPONIN T, HIGH SENSITIVITY  TROPONIN T, HIGH SENSITIVITY    EKG: None  Radiology: DG Chest 2 View Result Date: 03/04/2024 CLINICAL DATA:  chest pain, shob EXAM: CHEST - 2 VIEW COMPARISON:  July 26, 2022 FINDINGS: Surgical staple line in the right upper  lung zone. No focal airspace consolidation, pleural effusion, or pneumothorax. No cardiomegaly.No acute fracture or destructive lesion. Multilevel thoracic osteophytosis. IMPRESSION: No acute cardiopulmonary abnormality. Electronically Signed   By: Rogelia Myers M.D.   On: 03/04/2024 15:51   DG Bone Density Result Date: 03/04/2024 EXAM: DUAL X-RAY ABSORPTIOMETRY (DXA) FOR BONE MINERAL DENSITY 03/04/2024 2:12 pm CLINICAL DATA:  63 year old Female Postmenopausal. Osteopenia History of fragility fracture. Patient is or has been on glucocorticoid therapy. TECHNIQUE: An axial (e.g., hips, spine) and/or appendicular (e.g., radius) exam was performed, as appropriate, using [employer] Secretary/administrator at [facility]. Images are obtained for bone  mineral density measurement and are not obtained for diagnostic purposes. [id number] Exclusions: None. COMPARISON:  None. New baseline. FINDINGS: Scan quality: Good. LUMBAR SPINE (L1-L4): BMD (in g/cm2): 1.055 T-score: -1.5 Z-score: 0.1 LEFT FEMORAL NECK: BMD (in g/cm2): 0.775 T-score: -1.9 Z-score: -0.5 LEFT TOTAL HIP: BMD (in g/cm2): 0.764 T-score: -1.9 Z-score: -0.8 RIGHT FEMORAL NECK: BMD (in g/cm2): 0.788 T-score: -1.8 Z-score: -0.4 RIGHT TOTAL HIP: BMD (in g/cm2): 0.763 T-score: -1.9 Z-score: -0.8 FRAX 10-YEAR PROBABILITY OF FRACTURE: 10-year fracture risk is performed using the [employer] FRAX calculator based on patient-reported risk factors. Major osteoporotic fracture: 18.9% Hip fracture: 1.4% Other situations known to alter the reliability of the FRAX score should be considered when making treatment decisions, including chronic glucocorticoid use and past treatments. Further guidance on treatment can be found at the [facility]'s website [email or web address]/. IMPRESSION: Osteopenia based on BMD. Fracture risk is increased. Increased risk is based on low BMD and history of fragility fracture. RECOMMENDATIONS: 1.  All patients should optimize calcium  and vitamin D  intake. 2. Consider FDA-approved medical therapies in postmenopausal women and men aged 11 years and older, based on the following: - A hip or vertebral (clinical or morphometric) fracture - T-score less than or equal to -2.5 and secondary causes have been excluded. - Low bone mass (T-score between -1.0 and -2.5) and a 10-year probability of a hip fracture greater than or equal to 3% or a 10-year probability of a major osteoporosis-related fracture greater than or equal to 20% based on the [place] -adapted WHO algorithm. - Clinician judgment and/or patient preferences may indicate treatment for people with 10-year fracture probabilities above or below these levels 3. Patients with diagnosis of osteoporosis or at high risk for fracture should have regular bone mineral density tests. For patients eligible for Medicare, routine testing is allowed once every 2 years. The testing frequency can be increased to one year for patients who have rapidly progressing disease, those who are receiving or discontinuing medical therapy to restore bone mass, or have additional risk factors. Electronically Signed   By: Dina  Arceo M.D.   On: 03/04/2024 14:25   Procedures   Medications Ordered in the ED  ipratropium-albuterol  (DUONEB) 0.5-2.5 (3) MG/3ML nebulizer solution 3 mL (3 mLs Nebulization Given 03/04/24 1607)                                 Medical Decision Making Amount and/or Complexity of Data Reviewed Labs: ordered. Radiology: ordered.   This patient presents to the ED for concern of malaise, shortness of breath, this involves an extensive number of treatment options, and is a complaint that carries with it a high risk of complications and morbidity.   Differential diagnosis includes: COPD exacerbation, flu, COVID, RSV, unspecified URI, pneumonia  Co morbidities:  COPD, pulmonary nodules, peripheral edema, depression, hypertriglyceridemia, PAD, anxiety,  atherosclerosis  Lab Tests:  I Ordered, and personally interpreted labs.  The pertinent results include: Mild leukocytosis of 10.8  Imaging Studies:  I ordered imaging studies including chest x-ray I independently visualized and interpreted imaging which showed no acute abnormality I agree with the radiologist interpretation  Cardiac Monitoring/ECG:  The patient was maintained on a cardiac monitor.  I personally viewed and interpreted the cardiac monitored which showed an underlying rhythm of: Sinus rhythm  Medicines ordered and prescription drug management:  I ordered medication including  Medications  ipratropium-albuterol  (DUONEB) 0.5-2.5 (3) MG/3ML nebulizer solution 3 mL (  3 mLs Nebulization Given 03/04/24 1607)   for chest tightness and decreased breath sounds Reevaluation of the patient after these medicines showed that the patient improved I have reviewed the patients home medicines and have made adjustments as needed  Test Considered:   none  Critical Interventions:   none   Consultations Obtained: None  Problem List / ED Course:     ICD-10-CM   1. Malaise and fatigue  R53.81    R53.83       MDM: 63 year old female who presents emergency department for evaluation of malaise, shortness of breath and cough.  Patient's reported symptoms started on or shortly there after 11/21 when she had a cervical epidural.  She does have a history of COPD and reports compliance with her medication and inhalers.  On physical exam, her breath sounds on the right are mildly decreased.  I did order a DuoNeb to help open up her airway.  Her lab work was grossly unremarkable, with a mild leukocytosis of 10.8.  Her flu, COVID, RSV test were negative.  Her chest x-ray showed no acute cardiopulmonary abnormality or evidence of infection.  Upon reassessment, patient continues to report fatigue, but is having less shortness of breath.  I suspect her symptoms may be the start of a viral  infection.  I have informed patient of this and have educated her that she may take Tylenol  and/or ibuprofen  as needed for symptom management.  I have also recommended the patient stay hydrated and continue to obtain adequate rest.  Patient verbalized understanding to this.  Patient's vital signs are stable.  Patient is appropriate for discharge at this time.   Dispostion:  After consideration of the diagnostic results and the patients response to treatment, I feel that the patient would benefit from supportive care.    Final diagnoses:  Malaise and fatigue    ED Discharge Orders     None          Torrence Marry GORMAN DEVONNA 03/04/24 1739    Ula Prentice SAUNDERS, MD 03/04/24 [phone]

## 2024-03-04 NOTE — Telephone Encounter (Signed)
 Refuse, pt needs to schedule appt.

## 2024-03-04 NOTE — ED Triage Notes (Signed)
 Pt c/o shob, muscle aches, feeling off, had cervical epidural on 11/21, also c/o R lung pain, non productive cough.   Speaking in full sentences, respirations unlabored.   Denies known sick contacts, fever.

## 2024-03-05 ENCOUNTER — Ambulatory Visit: Payer: Self-pay | Admitting: Nurse Practitioner

## 2024-03-06 ENCOUNTER — Other Ambulatory Visit: Payer: Self-pay | Admitting: Adult Health

## 2024-03-06 ENCOUNTER — Other Ambulatory Visit: Payer: Self-pay | Admitting: Internal Medicine

## 2024-03-06 DIAGNOSIS — F331 Major depressive disorder, recurrent, moderate: Secondary | ICD-10-CM

## 2024-03-06 NOTE — Telephone Encounter (Signed)
 Patient would like to know if Dr Rollene could represcribe this medication for her.She stated that she doesn't see Dr Hale any more,and she discussed with Dr Rollene in regards to taking over this medication for her. She has acid reflux.

## 2024-03-06 NOTE — Telephone Encounter (Signed)
 Pt called and is out of her Effexor  . She called pharmacy last week and they just sent the RF today.

## 2024-03-06 NOTE — Telephone Encounter (Signed)
 Pt has not been seen since 02/2023. We have refused medication several times. Needs an appt and refills can be sent at that time.

## 2024-03-08 ENCOUNTER — Telehealth: Payer: Self-pay | Admitting: Adult Health

## 2024-03-08 DIAGNOSIS — F331 Major depressive disorder, recurrent, moderate: Secondary | ICD-10-CM

## 2024-03-08 NOTE — Telephone Encounter (Signed)
 Crystal Duarte called and made an appt for 12/12. Please fill her effexor 

## 2024-03-08 NOTE — Telephone Encounter (Signed)
 Lvm for pt to call and schedule

## 2024-03-11 DIAGNOSIS — J441 Chronic obstructive pulmonary disease with (acute) exacerbation: Secondary | ICD-10-CM | POA: Diagnosis not present

## 2024-03-11 DIAGNOSIS — R0609 Other forms of dyspnea: Secondary | ICD-10-CM | POA: Diagnosis not present

## 2024-03-11 MED ORDER — VENLAFAXINE HCL ER 150 MG PO CP24: 150.0000 mg | ORAL_CAPSULE | Freq: Every day | ORAL | 0 refills | Status: DC

## 2024-03-11 MED ORDER — VENLAFAXINE HCL ER 75 MG PO CP24: 75.0000 mg | ORAL_CAPSULE | Freq: Every day | ORAL | 0 refills | Status: DC

## 2024-03-11 MED ORDER — OMEPRAZOLE 20 MG PO CPDR: 20.0000 mg | DELAYED_RELEASE_CAPSULE | Freq: Every day | ORAL | 3 refills | Status: AC

## 2024-03-11 NOTE — Telephone Encounter (Signed)
 Sent in 30-day supply for both Effexor  doses.

## 2024-03-11 NOTE — Addendum Note (Signed)
 Addended by: ROLLENE NORRIS A on: 03/11/2024 07:28 AM   Modules accepted: Orders

## 2024-03-15 ENCOUNTER — Telehealth: Admitting: Adult Health

## 2024-03-15 ENCOUNTER — Encounter: Payer: Self-pay | Admitting: Adult Health

## 2024-03-15 DIAGNOSIS — F411 Generalized anxiety disorder: Secondary | ICD-10-CM | POA: Diagnosis not present

## 2024-03-15 DIAGNOSIS — F331 Major depressive disorder, recurrent, moderate: Secondary | ICD-10-CM | POA: Diagnosis not present

## 2024-03-15 DIAGNOSIS — F5105 Insomnia due to other mental disorder: Secondary | ICD-10-CM

## 2024-03-15 DIAGNOSIS — F22 Delusional disorders: Secondary | ICD-10-CM

## 2024-03-15 DIAGNOSIS — F99 Mental disorder, not otherwise specified: Secondary | ICD-10-CM | POA: Diagnosis not present

## 2024-03-15 MED ORDER — VENLAFAXINE HCL ER 75 MG PO CP24: 75.0000 mg | ORAL_CAPSULE | Freq: Every day | ORAL | 1 refills | Status: AC

## 2024-03-15 MED ORDER — GABAPENTIN 400 MG PO CAPS: 400.0000 mg | ORAL_CAPSULE | Freq: Two times a day (BID) | ORAL | 3 refills | Status: AC

## 2024-03-15 MED ORDER — VENLAFAXINE HCL ER 150 MG PO CP24: 150.0000 mg | ORAL_CAPSULE | Freq: Every day | ORAL | 1 refills | Status: AC

## 2024-03-15 MED ORDER — TRAZODONE HCL 100 MG PO TABS: 250.0000 mg | ORAL_TABLET | Freq: Every day | ORAL | 3 refills | Status: AC

## 2024-03-15 MED ORDER — BUSPIRONE HCL 10 MG PO TABS: ORAL_TABLET | ORAL | 3 refills | Status: AC

## 2024-03-15 MED ORDER — PROPRANOLOL HCL 10 MG PO TABS: 10.0000 mg | ORAL_TABLET | Freq: Three times a day (TID) | ORAL | 3 refills | Status: AC

## 2024-03-15 MED ORDER — GABAPENTIN 600 MG PO TABS: 600.0000 mg | ORAL_TABLET | Freq: Every day | ORAL | 3 refills | Status: AC

## 2024-03-15 NOTE — Progress Notes (Signed)
 Crystal Duarte [id number] [birth year] 63 y.o.  Virtual Visit via Video Note  I connected with pt @ on 03/15/2024 at  3:00 PM EST by a video enabled telemedicine application and verified that I am speaking with the correct person using two identifiers.   I discussed the limitations of evaluation and management by telemedicine and the availability of in person appointments. The patient expressed understanding and agreed to proceed.  I discussed the assessment and treatment plan with the patient. The patient was provided an opportunity to ask questions and all were answered. The patient agreed with the plan and demonstrated an understanding of the instructions.   The patient was advised to call back or seek an in-person evaluation if the symptoms worsen or if the condition fails to improve as anticipated.  I provided 25 minutes of non-face-to-face time during this encounter.  The patient was located at home.  The provider was located at [facility].   Crystal LOISE Sayers, NP   Subjective:   Patient ID:  Crystal Duarte is a 63 y.o. (DOB [birth year]) female.  Chief Complaint: No chief complaint on file.   HPI Crystal Duarte presents for follow-up of MDD, GAD, paranoia and insomnia.  Describes mood today as ok. Pleasant. Flat. Denies tearfulness. Mood symptoms - denies depression, anxiety, and irritability. Reports stable interest and motivation. Denies panic attacks. Denies worry and rumination. Reports some over thinking. Reports mood is stable. Stating I'm feeling like I'm in the best place I've been mentally. Feels like medications are helpful. Working with a paramedic. Taking medications as prescribed. Energy levels a little lower - lack of sleep. Active, does not have a regular exercise routine, but is active. Enjoys some usual interests and activities. Single. Lives alone. Family local.  Appetite adequate. Weight loss - 32 pounds - 169 pounds from 202  pounds. Sleeps well most nights. Averages 7 hours. Denies daytime napping. Focus and concentration stable. Completing tasks. Managing aspects of household. Receives SSI disability (2016). Works part time at a senior living facility. Denies SI or HI.  Denies AH or VH. Denies self harm. Denies substance use. Denies paranoia.  Reporting a long battle with mental illness to include multiple hospitalizations, multiple medication trials, ECT, TMS, and CBT.  Review of Systems:  Review of Systems  Musculoskeletal:  Negative for gait problem.  Neurological:  Negative for tremors.  Psychiatric/Behavioral:         Please refer to HPI    Medications: I have reviewed the patient's current medications.  Current Outpatient Medications  Medication Sig Dispense Refill   albuterol  (VENTOLIN  HFA) 108 (90 Base) MCG/ACT inhaler Inhale 2 puffs into the lungs every 4 (four) hours as needed. 8 g 0   busPIRone  (BUSPAR ) 10 MG tablet Take two tablets three times daily for anxiety. 540 tablet 3   Cholecalciferol (VITAMIN D3 PO) Take by mouth.     Fluticasone -Umeclidin-Vilant (TRELEGY ELLIPTA ) 100-62.5-25 MCG/ACT AEPB Inhale 1 puff then rinse mouth, once daily 60 each 12   gabapentin  (NEURONTIN ) 400 MG capsule Take 1 capsule (400 mg total) by mouth 2 (two) times daily. 180 capsule 3   gabapentin  (NEURONTIN ) 600 MG tablet Take 1 tablet (600 mg total) by mouth at bedtime. 90 tablet 3   ipratropium-albuterol  (DUONEB) 0.5-2.5 (3) MG/3ML SOLN Take 3 mLs by nebulization every 6 (six) hours as needed. 360 mL 1   montelukast  (SINGULAIR ) 10 MG tablet TAKE 1 TABLET BY MOUTH AT BEDTIME 90 tablet 2   Multiple Vitamin (MULTI-VITAMINS)  TABS Take 1 tablet by mouth daily.      nystatin  cream (MYCOSTATIN ) Apply 1 Application topically 2 (two) times daily. (Patient not taking: Reported on 01/30/2024) 30 g 0   nystatin -triamcinolone  ointment (MYCOLOG) Apply 1 Application topically 2 (two) times daily. (Patient not taking: Reported  on 01/30/2024) 100 g 3   omeprazole  (PRILOSEC) 20 MG capsule Take 1 capsule (20 mg total) by mouth daily. 90 capsule 3   predniSONE  (DELTASONE ) 20 MG tablet Take 2 tablets (40 mg total) by mouth daily with breakfast. 10 tablet 0   propranolol  (INDERAL ) 10 MG tablet Take 1 tablet (10 mg total) by mouth 3 (three) times daily. 270 tablet 3   rosuvastatin  (CRESTOR ) 20 MG tablet Take 1 tablet (20 mg total) by mouth daily. 90 tablet 1   traZODone  (DESYREL ) 100 MG tablet Take 2.5 tablets (250 mg total) by mouth at bedtime. 225 tablet 3   triamcinolone  ointment (KENALOG ) 0.5 % Apply 1 Application topically 2 (two) times daily. 30 g 1   valACYclovir  (VALTREX ) 1000 MG tablet Take 1 tablet (1,000 mg total) by mouth 2 (two) times daily. 20 tablet 6   venlafaxine  XR (EFFEXOR  XR) 75 MG 24 hr capsule Take 1 capsule (75 mg total) by mouth daily with breakfast. 90 capsule 1   venlafaxine  XR (EFFEXOR -XR) 150 MG 24 hr capsule Take 1 capsule (150 mg total) by mouth daily with breakfast. 90 capsule 1   No current facility-administered medications for this visit.    Medication Side Effects: None  Allergies: Allergies[1]  Past Medical History:  Diagnosis Date   Coccidioidomycosis, pulmonary    COPD (chronic obstructive pulmonary disease) (HCC)    Depression    Hx of adenomatous polyp of colon 12/04/2014   Major depressive disorder    since 14, electroconvulsive therapy, and transcranial magnetic stimulation recently for 6 weeks every day    Osteopenia 01/2019   T score -2.1 FRAX 9.6% / 2.1% stable from prior DEXA   Panic attack    PONV (postoperative nausea and vomiting)    VAIN I (vaginal intraepithelial neoplasia grade I) 2015   Positive HPV negative subtype 16, 18/45    Family History  Problem Relation Age of Onset   Colon polyps Mother    Asthma Mother    Ovarian cancer Mother    Varicose Veins Mother    Colon polyps Father    Heart disease Father    Peripheral vascular disease Father    Lung  cancer Maternal Grandmother    Colon cancer Neg Hx    Esophageal cancer Neg Hx    Stomach cancer Neg Hx    Pancreatic cancer Neg Hx    Liver disease Neg Hx    Breast cancer Neg Hx     Social History   Socioeconomic History   Marital status: Divorced    Spouse name: Not on file   Number of children: 2   Years of education: Not on file   Highest education level: Associate degree: academic program  Occupational History   Occupation: Librarian, Academic    Comment: disability    Employer: [employer]  Tobacco Use   Smoking status: Former    Current packs/day: 0.00    Average packs/day: 1 pack/day for 42.0 years (42.0 ttl pk-yrs)    Types: Cigarettes    Start date: 01/01/1980    Quit date: 12/31/2021    Years since quitting: 2.2    Passive exposure: Past   Smokeless tobacco: Never  Vaping  Use   Vaping status: Never Used  Substance and Sexual Activity   Alcohol use: No    Alcohol/week: 0.0 standard drinks of alcohol   Drug use: No   Sexual activity: Not Currently    Partners: Male    Birth control/protection: Surgical    Comment: menarche 63yo, 1st intercourse 63 yo-More than 5 partners  Other Topics Concern   Not on file  Social History Narrative   Lives with son, daughter in law and 4 grandchildren    Social Drivers of Health   Tobacco Use: Medium Risk (03/15/2024)   Patient History    Smoking Tobacco Use: Former    Smokeless Tobacco Use: Never    Passive Exposure: Past  Physicist, Medical Strain: Low Risk (01/26/2024)   Overall Financial Resource Strain (CARDIA)    Difficulty of Paying Living Expenses: Not very hard  Food Insecurity: No Food Insecurity (01/26/2024)   Epic    Worried About Programme Researcher, Broadcasting/film/video in the Last Year: Never true    Ran Out of Food in the Last Year: Never true  Recent Concern: Food Insecurity - Food Insecurity Present (12/15/2023)   Epic    Worried About Programme Researcher, Broadcasting/film/video in the Last Year: Sometimes true    Ran Out of Food in the Last  Year: Never true  Transportation Needs: No Transportation Needs (01/26/2024)   Epic    Lack of Transportation (Medical): No    Lack of Transportation (Non-Medical): No  Physical Activity: Sufficiently Active (01/26/2024)   Exercise Vital Sign    Days of Exercise per Week: 4 days    Minutes of Exercise per Session: 60 min  Recent Concern: Physical Activity - Insufficiently Active (12/15/2023)   Exercise Vital Sign    Days of Exercise per Week: 3 days    Minutes of Exercise per Session: 30 min  Stress: Stress Concern Present (01/26/2024)   [employer] of Occupational Health - Occupational Stress Questionnaire    Feeling of Stress: To some extent  Social Connections: Moderately Integrated (01/26/2024)   Social Connection and Isolation Panel    Frequency of Communication with Friends and Family: More than three times a week    Frequency of Social Gatherings with Friends and Family: More than three times a week    Attends Religious Services: 1 to 4 times per year    Active Member of [employer] or Organizations: Yes    Attends Engineer, Structural: More than 4 times per year    Marital Status: Divorced  Recent Concern: Social Connections - Moderately Isolated (12/15/2023)   Social Connection and Isolation Panel    Frequency of Communication with Friends and Family: More than three times a week    Frequency of Social Gatherings with Friends and Family: Three times a week    Attends Religious Services: 1 to 4 times per year    Active Member of Clubs or Organizations: No    Attends Banker Meetings: Never    Marital Status: Divorced  Catering Manager Violence: Not At Risk (12/15/2023)   Epic    Fear of Current or Ex-Partner: No    Emotionally Abused: No    Physically Abused: No    Sexually Abused: No  Depression (PHQ2-9): Low Risk (01/30/2024)   Depression (PHQ2-9)    PHQ-2 Score: 4  Alcohol Screen: Low Risk (12/15/2023)   Alcohol Screen    Last Alcohol Screening  Score (AUDIT): 0  Housing: Unknown (01/26/2024)   Epic  Unable to Pay for Housing in the Last Year: No    Number of Times Moved in the Last Year: Not on file    Homeless in the Last Year: No  Utilities: Not At Risk (12/15/2023)   Epic    Threatened with loss of utilities: No  Health Literacy: Adequate Health Literacy (12/15/2023)   B1300 Health Literacy    Frequency of need for help with medical instructions: Never    Past Medical History, Surgical history, Social history, and Family history were reviewed and updated as appropriate.   Please see review of systems for further details on the patient's review from today.   Objective:   Physical Exam:  There were no vitals taken for this visit.  Physical Exam Constitutional:      General: She is not in acute distress. Musculoskeletal:        General: No deformity.  Neurological:     Mental Status: She is alert and oriented to person, place, and time.     Coordination: Coordination normal.  Psychiatric:        Attention and Perception: Attention and perception normal. She does not perceive auditory or visual hallucinations.        Mood and Affect: Mood normal. Mood is not anxious or depressed. Affect is not labile, blunt, angry or inappropriate.        Speech: Speech normal.        Behavior: Behavior normal.        Thought Content: Thought content normal. Thought content is not paranoid or delusional. Thought content does not include homicidal or suicidal ideation. Thought content does not include homicidal or suicidal plan.        Cognition and Memory: Cognition and memory normal.        Judgment: Judgment normal.     Comments: Insight intact     Lab Review:     Component Value Date/Time   NA 140 03/04/2024 1439   NA 147 (H) 02/12/2021 1409   NA 136 05/14/2013 1829   K 3.9 03/04/2024 1439   K 3.8 05/14/2013 1829   CL 103 03/04/2024 1439   CL 98 05/14/2013 1829   CO2 26 03/04/2024 1439   CO2 35 (H) 05/14/2013 1829    GLUCOSE 89 03/04/2024 1439   GLUCOSE 104 (H) 05/14/2013 1829   BUN 8 03/04/2024 1439   BUN 10 02/12/2021 1409   BUN 7 05/14/2013 1829   CREATININE 0.78 03/04/2024 1439   CREATININE 0.83 01/27/2020 0940   CALCIUM  9.5 03/04/2024 1439   CALCIUM  9.1 05/14/2013 1829   PROT 6.4 01/30/2024 1415   PROT 5.9 (L) 02/12/2021 1409   PROT 7.2 05/14/2013 1829   ALBUMIN 4.0 01/30/2024 1415   ALBUMIN 4.2 02/12/2021 1409   ALBUMIN 3.5 05/14/2013 1829   AST 18 01/30/2024 1415   AST 16 05/14/2013 1829   ALT 19 01/30/2024 1415   ALT 36 05/14/2013 1829   ALKPHOS 73 01/30/2024 1415   ALKPHOS 83 05/14/2013 1829   BILITOT 0.4 01/30/2024 1415   BILITOT 0.3 02/12/2021 1409   BILITOT 0.5 05/14/2013 1829   GFRNONAA >60 03/04/2024 1439   GFRNONAA >60 05/14/2013 1829   GFRAA 99 03/02/2020 1553   GFRAA >60 05/14/2013 1829       Component Value Date/Time   WBC 10.8 (H) 03/04/2024 1439   RBC 4.41 03/04/2024 1439   HGB 13.8 03/04/2024 1439   HGB 13.4 05/14/2013 1829   HCT 42.0 03/04/2024 1439   HCT 40.4  05/14/2013 1829   PLT 237 03/04/2024 1439   PLT 267 05/14/2013 1829   MCV 95.2 03/04/2024 1439   MCV 95 05/14/2013 1829   MCH 31.3 03/04/2024 1439   MCHC 32.9 03/04/2024 1439   RDW 12.8 03/04/2024 1439   RDW 13.3 05/14/2013 1829   LYMPHSABS 3.3 08/03/2023 1411   LYMPHSABS 3.6 05/14/2013 1829   MONOABS 0.7 08/03/2023 1411   MONOABS 0.6 05/14/2013 1829   EOSABS 0.3 08/03/2023 1411   EOSABS 0.2 05/14/2013 1829   BASOSABS 0.1 08/03/2023 1411   BASOSABS 0.1 05/14/2013 1829    No results found for: POCLITH, LITHIUM   No results found for: PHENYTOIN, PHENOBARB, VALPROATE, CBMZ   .res Assessment: Plan:    Plan:  Trazadone 250mg  at bedtime Buspar  10mg  - 20mg  TID Propranolol  10mg  TID Effexor  XR 150mg  daily Effexor  XR 75mg  daily Gabapentin  400mg  twice daily and 600mg  at bedtime  15 minutes spent dedicated to the care of this patient on the date of this encounter to include  pre-visit review of records, ordering of medication, post visit documentation, and face-to-face time with the patient discussing MDD, GAD, paranoia and insomnia. Discussed continuing current medication regimen.  Discussed potential metabolic side effects associated with atypical antipsychotics, as well as potential risk for movement side effects. Advised pt to contact office if movement side effects occur.    RTC 6 months  Diagnoses and all orders for this visit:  Major depressive disorder, recurrent episode, moderate (HCC) -     venlafaxine  XR (EFFEXOR  XR) 75 MG 24 hr capsule; Take 1 capsule (75 mg total) by mouth daily with breakfast. -     venlafaxine  XR (EFFEXOR -XR) 150 MG 24 hr capsule; Take 1 capsule (150 mg total) by mouth daily with breakfast. -     traZODone  (DESYREL ) 100 MG tablet; Take 2.5 tablets (250 mg total) by mouth at bedtime. -     busPIRone  (BUSPAR ) 10 MG tablet; Take two tablets three times daily for anxiety. -     propranolol  (INDERAL ) 10 MG tablet; Take 1 tablet (10 mg total) by mouth 3 (three) times daily. -     gabapentin  (NEURONTIN ) 400 MG capsule; Take 1 capsule (400 mg total) by mouth 2 (two) times daily. -     gabapentin  (NEURONTIN ) 600 MG tablet; Take 1 tablet (600 mg total) by mouth at bedtime.  Generalized anxiety disorder  Insomnia due to other mental disorder  Paranoia [facility])     Please see After Visit Summary for patient specific instructions.  Future Appointments  Date Time Provider Department Center  05/01/2024  2:00 PM [facility] 1 [facility] [place]  05/28/2024  1:45 PM Hattar, Zola SAILOR, MD [facility] [id number]    No orders of the defined types were placed in this encounter.     -------------------------------     [1]  Allergies Allergen Reactions   Other Nausea And Vomiting    novacaine    Procaine Nausea And Vomiting   Latuda [Lurasidone Hcl] Anxiety   Sulfa Antibiotics Rash    Only in sunlight

## 2024-04-05 ENCOUNTER — Other Ambulatory Visit: Payer: Self-pay | Admitting: Adult Health

## 2024-04-05 DIAGNOSIS — F331 Major depressive disorder, recurrent, moderate: Secondary | ICD-10-CM

## 2024-05-01 ENCOUNTER — Ambulatory Visit (HOSPITAL_COMMUNITY)

## 2024-05-06 ENCOUNTER — Ambulatory Visit: Admitting: Psychiatry

## 2024-05-06 NOTE — Progress Notes (Signed)
 Patient ID: Crystal Duarte, female   DOB: Jan 24, 1961, 64 y.o.   MRN: [id number]   Patient no-show for virtual appt. Therapist called her twice but got recording and did leave message for her.

## 2024-05-07 ENCOUNTER — Ambulatory Visit: Admitting: Psychiatry

## 2024-05-07 DIAGNOSIS — F331 Major depressive disorder, recurrent, moderate: Secondary | ICD-10-CM
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
## 2024-05-08 ENCOUNTER — Encounter (HOSPITAL_COMMUNITY): Payer: Self-pay

## 2024-05-08 ENCOUNTER — Ambulatory Visit (HOSPITAL_COMMUNITY)
Admission: RE | Admit: 2024-05-08 | Discharge: 2024-05-08 | Disposition: A | Source: Ambulatory Visit | Attending: Acute Care | Admitting: Acute Care

## 2024-05-08 DIAGNOSIS — F17210 Nicotine dependence, cigarettes, uncomplicated: Secondary | ICD-10-CM

## 2024-05-08 DIAGNOSIS — Z122 Encounter for screening for malignant neoplasm of respiratory organs: Secondary | ICD-10-CM

## 2024-05-08 DIAGNOSIS — Z87891 Personal history of nicotine dependence: Secondary | ICD-10-CM

## 2024-05-10 ENCOUNTER — Other Ambulatory Visit: Payer: Self-pay

## 2024-05-10 DIAGNOSIS — F17210 Nicotine dependence, cigarettes, uncomplicated: Secondary | ICD-10-CM

## 2024-05-10 DIAGNOSIS — Z122 Encounter for screening for malignant neoplasm of respiratory organs: Secondary | ICD-10-CM

## 2024-05-10 DIAGNOSIS — Z87891 Personal history of nicotine dependence: Secondary | ICD-10-CM

## 2024-05-27 ENCOUNTER — Ambulatory Visit: Admitting: Psychiatry

## 2024-05-28 ENCOUNTER — Encounter

## 2024-06-10 ENCOUNTER — Ambulatory Visit: Admitting: Psychiatry

## 2025-01-10 ENCOUNTER — Encounter: Admitting: Internal Medicine

## 2025-01-10 ENCOUNTER — Ambulatory Visit
